# Patient Record
Sex: Male | Born: 1955
Health system: Southern US, Community
[De-identification: ages and names within clinical notes are randomized; demographics above are authoritative.]

## PROBLEM LIST (undated history)

## (undated) DIAGNOSIS — G8929 Other chronic pain: Secondary | ICD-10-CM

## (undated) DIAGNOSIS — I219 Acute myocardial infarction, unspecified: Secondary | ICD-10-CM

## (undated) DIAGNOSIS — T7840XA Allergy, unspecified, initial encounter: Secondary | ICD-10-CM

## (undated) DIAGNOSIS — N200 Calculus of kidney: Secondary | ICD-10-CM

## (undated) DIAGNOSIS — K219 Gastro-esophageal reflux disease without esophagitis: Secondary | ICD-10-CM

## (undated) DIAGNOSIS — I10 Essential (primary) hypertension: Secondary | ICD-10-CM

## (undated) DIAGNOSIS — F419 Anxiety disorder, unspecified: Secondary | ICD-10-CM

## (undated) DIAGNOSIS — I2510 Atherosclerotic heart disease of native coronary artery without angina pectoris: Secondary | ICD-10-CM

## (undated) DIAGNOSIS — F32A Depression, unspecified: Secondary | ICD-10-CM

## (undated) DIAGNOSIS — E119 Type 2 diabetes mellitus without complications: Secondary | ICD-10-CM

## (undated) DIAGNOSIS — B019 Varicella without complication: Secondary | ICD-10-CM

## (undated) DIAGNOSIS — K635 Polyp of colon: Secondary | ICD-10-CM

## (undated) DIAGNOSIS — F329 Major depressive disorder, single episode, unspecified: Secondary | ICD-10-CM

## (undated) DIAGNOSIS — E785 Hyperlipidemia, unspecified: Secondary | ICD-10-CM

## (undated) DIAGNOSIS — M1990 Unspecified osteoarthritis, unspecified site: Secondary | ICD-10-CM

## (undated) HISTORY — DX: Hyperlipidemia, unspecified: E78.5

## (undated) HISTORY — DX: Anxiety disorder, unspecified: F41.9

## (undated) HISTORY — PX: COLONOSCOPY: SHX174

## (undated) HISTORY — DX: Allergy, unspecified, initial encounter: T78.40XA

## (undated) HISTORY — DX: Essential (primary) hypertension: I10

## (undated) HISTORY — DX: Atherosclerotic heart disease of native coronary artery without angina pectoris: I25.10

## (undated) HISTORY — DX: Calculus of kidney: N20.0

## (undated) HISTORY — DX: Major depressive disorder, single episode, unspecified: F32.9

## (undated) HISTORY — DX: Varicella without complication: B01.9

## (undated) HISTORY — DX: Polyp of colon: K63.5

## (undated) HISTORY — DX: Acute myocardial infarction, unspecified: I21.9

## (undated) HISTORY — PX: CRYO INTERCOSTAL NERVE BLOCK: SHX6522

## (undated) HISTORY — DX: Unspecified osteoarthritis, unspecified site: M19.90

## (undated) HISTORY — DX: Other chronic pain: G89.29

## (undated) HISTORY — DX: Type 2 diabetes mellitus without complications: E11.9

## (undated) HISTORY — PX: OTHER SURGICAL HISTORY: SHX169

## (undated) HISTORY — DX: Depression, unspecified: F32.A

## (undated) HISTORY — PX: POLYPECTOMY: SHX149

## (undated) HISTORY — DX: Gastro-esophageal reflux disease without esophagitis: K21.9

---
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
## 2012-08-15 DIAGNOSIS — I2510 Atherosclerotic heart disease of native coronary artery without angina pectoris: Secondary | ICD-10-CM

## 2012-08-15 HISTORY — DX: Atherosclerotic heart disease of native coronary artery without angina pectoris: I25.10

## 2012-12-29 ENCOUNTER — Encounter: Payer: Self-pay | Admitting: Family Medicine

## 2012-12-29 ENCOUNTER — Encounter (INDEPENDENT_AMBULATORY_CARE_PROVIDER_SITE_OTHER): Payer: Self-pay

## 2012-12-29 ENCOUNTER — Ambulatory Visit (INDEPENDENT_AMBULATORY_CARE_PROVIDER_SITE_OTHER): Payer: BC Managed Care – PPO | Admitting: Family Medicine

## 2012-12-29 VITALS — BP 136/78 | HR 69 | Temp 98.1°F | Ht 74.0 in | Wt 215.0 lb

## 2012-12-29 DIAGNOSIS — Z23 Encounter for immunization: Secondary | ICD-10-CM

## 2012-12-29 DIAGNOSIS — I2510 Atherosclerotic heart disease of native coronary artery without angina pectoris: Secondary | ICD-10-CM

## 2012-12-29 DIAGNOSIS — E785 Hyperlipidemia, unspecified: Secondary | ICD-10-CM

## 2012-12-29 DIAGNOSIS — M47817 Spondylosis without myelopathy or radiculopathy, lumbosacral region: Secondary | ICD-10-CM

## 2012-12-29 DIAGNOSIS — K219 Gastro-esophageal reflux disease without esophagitis: Secondary | ICD-10-CM

## 2012-12-29 DIAGNOSIS — I10 Essential (primary) hypertension: Secondary | ICD-10-CM

## 2012-12-29 DIAGNOSIS — F329 Major depressive disorder, single episode, unspecified: Secondary | ICD-10-CM | POA: Insufficient documentation

## 2012-12-29 DIAGNOSIS — R7303 Prediabetes: Secondary | ICD-10-CM

## 2012-12-29 DIAGNOSIS — G250 Essential tremor: Secondary | ICD-10-CM

## 2012-12-29 DIAGNOSIS — F3289 Other specified depressive episodes: Secondary | ICD-10-CM

## 2012-12-29 DIAGNOSIS — R7309 Other abnormal glucose: Secondary | ICD-10-CM

## 2012-12-29 DIAGNOSIS — N200 Calculus of kidney: Secondary | ICD-10-CM

## 2012-12-29 DIAGNOSIS — Z860100 Personal history of colon polyps, unspecified: Secondary | ICD-10-CM

## 2012-12-29 DIAGNOSIS — F32A Depression, unspecified: Secondary | ICD-10-CM

## 2012-12-29 DIAGNOSIS — M47816 Spondylosis without myelopathy or radiculopathy, lumbar region: Secondary | ICD-10-CM | POA: Insufficient documentation

## 2012-12-29 DIAGNOSIS — N400 Enlarged prostate without lower urinary tract symptoms: Secondary | ICD-10-CM

## 2012-12-29 NOTE — Progress Notes (Signed)
Subjective:    Patient ID: Peter Snyder, male    DOB: 08/10/1955, 57 y.o.   MRN: [id number]  HPI  Here to establish care. He has a past history of multiple medical problems including history of CAD, GERD, hyperlipidemia, hypertension, depression, kidney stones, benign colon polyps, and prediabetes.  Patient had cardiac catheterization back in June of this year which revealed ejection fraction 35-40% with inferior wall akinesis with 0% left main, 30% proximal left circumflex, 100% tiny obtuse marginal 1 with collateral flow, 50% LAD, 50% diagonal 1, 100% RCA with collaterals. Echocardiogram revealed ejection fraction 55%. He has pending appointment with local cardiologist. No recent chest pain. No consistent exercise.  He's made substantial lifestyle changes over the past several months. He states he had A1c of 7.5% several months ago and prior to leaving [place] to move here 6.4%.  He's lost about 20 pounds.  No recent chest pains. No syncope. No dyspnea. Medications are reviewed and are listed elsewhere  Patient is married. He is retired from [employer]. Tetanus 2006. Colonoscopy last year. Flu vaccine already given.  Past Medical History  Diagnosis Date  . Arthritis   . Chicken pox   . Depression   . GERD (gastroesophageal reflux disease)   . Heart disease   . Hypertension   . Hyperlipidemia   . Kidney stone   . Colon polyps    Past Surgical History  Procedure Laterality Date  . Tonsillectomy  1963    reports that he has never smoked. He does not have any smokeless tobacco history on file. He reports that he does not drink alcohol or use illicit drugs. family history includes Arthritis in his father and paternal grandmother; Heart disease in his maternal grandmother; Hyperlipidemia in his maternal grandmother. Allergies  Allergen Reactions  . Sulfa Antibiotics       Review of Systems  Constitutional: Negative for fever, appetite change and fatigue.  HENT:  Negative for trouble swallowing.   Eyes: Negative for visual disturbance.  Respiratory: Negative for cough, chest tightness and shortness of breath.   Cardiovascular: Negative for chest pain, palpitations and leg swelling.  Gastrointestinal: Negative for nausea, vomiting, abdominal pain, diarrhea and constipation.  Endocrine: Negative for polydipsia and polyuria.  Genitourinary: Negative for dysuria.  Musculoskeletal: Positive for arthralgias.  Skin: Negative for rash.  Neurological: Negative for dizziness, syncope, weakness, light-headedness and headaches.  Hematological: Negative for adenopathy. Does not bruise/bleed easily.  Psychiatric/Behavioral: Negative for dysphoric mood.       Objective:   Physical Exam  Constitutional: He is oriented to person, place, and time. He appears well-developed and well-nourished.  HENT:  Right Ear: External ear normal.  Left Ear: External ear normal.  Mouth/Throat: Oropharynx is clear and moist.  Neck: Neck supple. No thyromegaly present.  Cardiovascular: Normal rate and regular rhythm.  Exam reveals no gallop.   Pulmonary/Chest: Effort normal and breath sounds normal. No respiratory distress. He has no wheezes. He has no rales.  Abdominal: Soft. He exhibits no mass. There is no tenderness. There is no rebound and no guarding.  Musculoskeletal: He exhibits no edema.  Neurological: He is alert and oriented to person, place, and time.  Skin: No rash noted.  Psychiatric: He has a normal mood and affect. His behavior is normal.          Assessment & Plan:  #1 history of CAD. Diffuse coronary disease as above. Treated medically. Pending followup with cardiologist. Discussed aggressive secondary prevention.  Will plan on labs in  3 months, including lipids and A1C #2 history of GERD well controlled with Nexium #3 history of depression treated with Lexapro and stable #4 history of chronic degenerative arthritis cervical and lumbar spine. Followed  by a local orthopedist #5 history of kidney stones #6 history of benign colon polyps #7 hypertension currently adequately controlled #8 history of dyslipidemia with reported lipids in September. Records pending #9history of BPH -stable on Flomax #10 seasonal allergies

## 2013-01-17 ENCOUNTER — Encounter: Payer: Self-pay | Admitting: Cardiology

## 2013-01-18 ENCOUNTER — Telehealth: Payer: Self-pay | Admitting: Cardiology

## 2013-01-18 ENCOUNTER — Encounter: Payer: Self-pay | Admitting: Cardiology

## 2013-01-18 ENCOUNTER — Ambulatory Visit (INDEPENDENT_AMBULATORY_CARE_PROVIDER_SITE_OTHER): Payer: BC Managed Care – PPO | Admitting: Cardiology

## 2013-01-18 VITALS — BP 110/59 | HR 63 | Ht 74.0 in | Wt 212.0 lb

## 2013-01-18 DIAGNOSIS — I2510 Atherosclerotic heart disease of native coronary artery without angina pectoris: Secondary | ICD-10-CM

## 2013-01-18 DIAGNOSIS — I10 Essential (primary) hypertension: Secondary | ICD-10-CM

## 2013-01-18 DIAGNOSIS — I2589 Other forms of chronic ischemic heart disease: Secondary | ICD-10-CM

## 2013-01-18 DIAGNOSIS — I255 Ischemic cardiomyopathy: Secondary | ICD-10-CM | POA: Insufficient documentation

## 2013-01-18 DIAGNOSIS — E785 Hyperlipidemia, unspecified: Secondary | ICD-10-CM

## 2013-01-18 NOTE — Assessment & Plan Note (Signed)
Continue present blood pressure medications. 

## 2013-01-18 NOTE — Assessment & Plan Note (Signed)
Continue ACE inhibitor and beta blocker. Plan repeat echocardiogram to reassess LV function when he returns in 6 months.

## 2013-01-18 NOTE — Assessment & Plan Note (Signed)
Continue statin. 

## 2013-01-18 NOTE — Telephone Encounter (Signed)
Left message for pt to change to 2 tabs at bedtime and one tab in the morning

## 2013-01-18 NOTE — Progress Notes (Signed)
HPI: 57 year old male for evaluation of coronary artery disease. Previous cardiac care in [place]. Echocardiogram in June 2014 showed an ejection fraction of 55%. Nuclear study in June of 2014 showed an ejection fraction of 43%, inferior and anterior defect and left ventricular dilatation with exercise. Cardiac catheterization performed in June of 2014 showed an ejection fraction of 35-40%. The inferior wall is akinetic. The left main was normal. There was a 30% proximal circumflex, occluded small first obtuse marginal with collaterals, a 50% ramus intermedius, 50% LAD, 50% first diagonal and an occluded right coronary artery with collaterals. Patient recently moved here and presents to establish. Patient denies dyspnea, chest pain, palpitations or syncope.  Current Outpatient Prescriptions  Medication Sig Dispense Refill  . ALPRAZolam (XANAX) 0.25 MG tablet Take 0.25 mg by mouth at bedtime as needed for sleep.      Marland Kitchen aspirin 81 MG tablet Take 81 mg by mouth daily.      Marland Kitchen atorvastatin (LIPITOR) 80 MG tablet Take 80 mg by mouth daily.      . benzonatate (TESSALON) 100 MG capsule       . carvedilol (COREG) 12.5 MG tablet Take 2 tablets in the morning and 1 tablet at night      . cefadroxil (DURICEF) 500 MG capsule       . celecoxib (CELEBREX) 200 MG capsule Take 200 mg by mouth 2 (two) times daily.      . cholecalciferol (VITAMIN D) 1000 UNITS tablet Take 1,000 Units by mouth daily.      . clidinium-chlordiazePOXIDE (LIBRAX) 5-2.5 MG per capsule       . clonazePAM (KLONOPIN) 0.5 MG tablet Once daily as needed for sleep      . Dermatological Products, Misc. (TETRIX) KIT       . escitalopram (LEXAPRO) 10 MG tablet Take 10 mg by mouth daily.      Marland Kitchen esomeprazole (NEXIUM) 20 MG capsule Take 20 mg by mouth daily before breakfast.      . fish oil-omega-3 fatty acids 1000 MG capsule Take 2 g by mouth daily.      . fluticasone (FLONASE) 50 MCG/ACT nasal spray Place 2 sprays into the nose daily.        Marland Kitchen gabapentin (NEURONTIN) 600 MG tablet Take 600 mg by mouth 2 (two) times daily.      Marland Kitchen lisinopril (PRINIVIL,ZESTRIL) 5 MG tablet Take 5 mg by mouth daily.      Marland Kitchen LOCOID LIPOCREAM 0.1 % CREA       . LOTEMAX 0.5 % GEL       . nortriptyline (PAMELOR) 10 MG capsule Take 10 mg by mouth at bedtime.      Marland Kitchen PROTOPIC 0.1 % ointment       . RESTASIS 0.05 % ophthalmic emulsion       . Tamsulosin HCl (FLOMAX PO) Take by mouth.      . VOLTAREN 1 % GEL       . zolpidem (AMBIEN) 10 MG tablet Take 10 mg by mouth at bedtime as needed for sleep.       No current facility-administered medications for this visit.    Allergies  Allergen Reactions  . Sulfa Antibiotics     Past Medical History  Diagnosis Date  . Arthritis   . Chicken pox   . Depression   . GERD (gastroesophageal reflux disease)   . CAD (coronary artery disease) 6/14    Cardiac catheterization 6/27/ 2014 ejection fraction 35-40%, 30% proximal  left circumflex, 100% tiny obtuse marginal 1 with collaterals, 50% LAD, 50% D1, 100% RCA with collaterals  . Hypertension   . Hyperlipidemia   . Kidney stone   . Colon polyps   . Diabetes mellitus     Diet controlled    Past Surgical History  Procedure Laterality Date  . Tonsillectomy  1963    History   Social History  . Marital Status: Married    Spouse Name: N/A    Number of Children: 3  . Years of Education: N/A   Occupational History  .      Retired   Social History Main Topics  . Smoking status: Never Smoker   . Smokeless tobacco: Not on file  . Alcohol Use: No  . Drug Use: No  . Sexual Activity: Not on file   Other Topics Concern  . Not on file   Social History Narrative  . No narrative on file    Family History  Problem Relation Age of Onset  . Arthritis Father   . Hyperlipidemia Maternal Grandmother   . Heart disease Maternal Grandmother   . Arthritis Paternal Grandmother     ROS: no fevers or chills, productive cough, hemoptysis, dysphasia,  odynophagia, melena, hematochezia, dysuria, hematuria, rash, seizure activity, orthopnea, PND, pedal edema, claudication. Remaining systems are negative.  Physical Exam:   Blood pressure 110/59, pulse 63, height 6\' 2"  (1.88 m), weight 212 lb (96.163 kg).  General:  Well developed/well nourished in NAD Skin warm/dry Patient not depressed No peripheral clubbing Back-normal HEENT-normal/normal eyelids Neck supple/normal carotid upstroke bilaterally; no bruits; no JVD; no thyromegaly chest - CTA/ normal expansion CV - RRR/normal S1 and S2; no murmurs, rubs or gallops;  PMI nondisplaced Abdomen -NT/ND, no HSM, no mass, + bowel sounds, no bruit 2+ femoral pulses, no bruits Ext-no edema, chords, 2+ DP Neuro-grossly nonfocal  ECG Sinus rhythm, inferior MI, LVH, IRBBB, PVC

## 2013-01-18 NOTE — Telephone Encounter (Signed)
New message  Patient has questions regarding koreg, He takes 2 in the morning and 1 @ bedtime.. This makes him sleepy during the day. Please advise.

## 2013-01-18 NOTE — Assessment & Plan Note (Signed)
Continue aspirin and statin. 

## 2013-01-18 NOTE — Patient Instructions (Signed)
Your physician wants you to follow-up in: 6 MONTHS WITH DR CRENSHAW You will receive a reminder letter in the mail two months in advance. If you don't receive a letter, please call our office to schedule the follow-up appointment.  

## 2013-01-26 ENCOUNTER — Other Ambulatory Visit: Payer: Self-pay | Admitting: Family Medicine

## 2013-01-27 ENCOUNTER — Ambulatory Visit (INDEPENDENT_AMBULATORY_CARE_PROVIDER_SITE_OTHER): Payer: BC Managed Care – PPO | Admitting: Family Medicine

## 2013-01-27 ENCOUNTER — Encounter: Payer: Self-pay | Admitting: Family Medicine

## 2013-01-27 VITALS — BP 118/78 | HR 106 | Temp 97.8°F | Wt 213.0 lb

## 2013-01-27 DIAGNOSIS — J019 Acute sinusitis, unspecified: Secondary | ICD-10-CM

## 2013-01-27 MED ORDER — BENZONATATE 200 MG PO CAPS: 200.0000 mg | ORAL_CAPSULE | Freq: Three times a day (TID) | ORAL | Status: DC | PRN

## 2013-01-27 MED ORDER — AMOXICILLIN-POT CLAVULANATE 875-125 MG PO TABS: 1.0000 | ORAL_TABLET | Freq: Two times a day (BID) | ORAL | Status: DC

## 2013-01-27 NOTE — Patient Instructions (Signed)

## 2013-01-27 NOTE — Progress Notes (Signed)
Pre visit review using our clinic review tool, if applicable. No additional management support is needed unless otherwise documented below in the visit note. 

## 2013-01-27 NOTE — Progress Notes (Signed)
  Subjective:    Patient ID: Peter Snyder, male    DOB: 1955/05/19, 57 y.o.   MRN: [id number]  HPI Acute visit for nasal congestion sinus congestion and cough. Onset a few days ago. Left maxillary facial pain. Intermittent bleeding. Greenish nasal discharge. No body aches. No fevers. Took Tessalon Perles for cough which helped. Requesting refill. Patient nonsmoker. Similar symptoms of acute sinusitis in the past.  Past Medical History  Diagnosis Date  . Arthritis   . Chicken pox   . Depression   . GERD (gastroesophageal reflux disease)   . CAD (coronary artery disease) 6/14    Cardiac catheterization 6/27/ 2014 ejection fraction 35-40%, 30% proximal left circumflex, 100% tiny obtuse marginal 1 with collaterals, 50% LAD, 50% D1, 100% RCA with collaterals  . Hypertension   . Hyperlipidemia   . Kidney stone   . Colon polyps   . Diabetes mellitus     Diet controlled   Past Surgical History  Procedure Laterality Date  . Tonsillectomy  1963    reports that he has never smoked. He does not have any smokeless tobacco history on file. He reports that he does not drink alcohol or use illicit drugs. family history includes Arthritis in his father and paternal grandmother; Heart disease in his maternal grandmother; Hyperlipidemia in his maternal grandmother. Allergies  Allergen Reactions  . Sulfa Antibiotics       Review of Systems  Constitutional: Positive for fatigue. Negative for fever, activity change and appetite change.  HENT: Positive for postnasal drip and sinus pressure. Negative for congestion, ear pain, sore throat and trouble swallowing.   Eyes: Negative for visual disturbance.  Respiratory: Positive for cough. Negative for shortness of breath and wheezing.   Neurological: Negative for dizziness and headaches.  Hematological: Negative for adenopathy.  Psychiatric/Behavioral: Negative for confusion and dysphoric mood.       Objective:   Physical Exam  Constitutional: He  appears well-developed and well-nourished.  HENT:  Right Ear: External ear normal.  Left Ear: External ear normal.  Mouth/Throat: Oropharynx is clear and moist.  Erythematous nasal mucosa with yellowish mucus left naris  Neck: Neck supple. No thyromegaly present.  Cardiovascular: Normal rate and regular rhythm.   Pulmonary/Chest: Effort normal and breath sounds normal. No respiratory distress. He has no wheezes. He has no rales.          Assessment & Plan:  Acute sinusitis. Augmentin 875 mg twice a day for 10 days. Continue Mucinex. Refill Tessalon for as needed use

## 2013-01-28 ENCOUNTER — Ambulatory Visit (INDEPENDENT_AMBULATORY_CARE_PROVIDER_SITE_OTHER): Payer: BC Managed Care – PPO | Admitting: Family Medicine

## 2013-01-28 ENCOUNTER — Encounter: Payer: Self-pay | Admitting: Family Medicine

## 2013-01-28 VITALS — BP 132/84 | HR 74 | Temp 97.7°F | Wt 213.0 lb

## 2013-01-28 DIAGNOSIS — R300 Dysuria: Secondary | ICD-10-CM

## 2013-01-28 LAB — POCT URINALYSIS DIPSTICK
Bilirubin, UA: NEGATIVE
Blood, UA: NEGATIVE
Glucose, UA: NEGATIVE
Leukocytes, UA: NEGATIVE
Spec Grav, UA: 1.02
pH, UA: 6.5

## 2013-01-28 NOTE — Progress Notes (Signed)
Pre visit review using our clinic review tool, if applicable. No additional management support is needed unless otherwise documented below in the visit note. 

## 2013-01-28 NOTE — Progress Notes (Signed)
  Subjective:    Patient ID: Peter Snyder, male    DOB: 31-Aug-1955, 57 y.o.   MRN: [id number]  HPI Patient was just seen yesterday for acute sinusitis symptoms Presents today with some burning with urination only once early this morning. This was at the end of urination. No fevers or chills. No abdominal pain. No flank pain. Does have history of previous kidney stones. No similar pain. No gross hematuria. Urine has been clear. He is taking Augmentin which was started yesterday for his sinus symptoms. No penile discharge  Past Medical History  Diagnosis Date  . Arthritis   . Chicken pox   . Depression   . GERD (gastroesophageal reflux disease)   . CAD (coronary artery disease) 6/14    Cardiac catheterization 6/27/ 2014 ejection fraction 35-40%, 30% proximal left circumflex, 100% tiny obtuse marginal 1 with collaterals, 50% LAD, 50% D1, 100% RCA with collaterals  . Hypertension   . Hyperlipidemia   . Kidney stone   . Colon polyps   . Diabetes mellitus     Diet controlled   Past Surgical History  Procedure Laterality Date  . Tonsillectomy  1963    reports that he has never smoked. He does not have any smokeless tobacco history on file. He reports that he does not drink alcohol or use illicit drugs. family history includes Arthritis in his father and paternal grandmother; Heart disease in his maternal grandmother; Hyperlipidemia in his maternal grandmother. Allergies  Allergen Reactions  . Sulfa Antibiotics       Review of Systems  Constitutional: Negative for fever and chills.  Gastrointestinal: Negative for abdominal pain.  Genitourinary: Positive for dysuria. Negative for hematuria and flank pain.       Objective:   Physical Exam  Constitutional: He appears well-developed and well-nourished.  Cardiovascular: Normal rate and regular rhythm.   Pulmonary/Chest: Effort normal and breath sounds normal. No respiratory distress. He has no wheezes. He has no rales.   Genitourinary: Rectum normal and prostate normal.          Assessment & Plan:  Dysuria. Urine reveals only trace ketones. No nitrites, leukocytes, or blood. Exam does not show any evidence for acute prostatitis. Continue Augmentin for sinus symptoms. Stay well hydrated. Followup if symptoms persist

## 2013-01-28 NOTE — Patient Instructions (Signed)
Your urine looks clear-no blood and no evidence for infection. Continue with Augmentin.  Follow up for any fever or continued urinary symptoms.

## 2013-02-08 ENCOUNTER — Other Ambulatory Visit: Payer: Self-pay | Admitting: Family Medicine

## 2013-02-14 ENCOUNTER — Telehealth: Payer: Self-pay | Admitting: Family Medicine

## 2013-02-14 NOTE — Telephone Encounter (Signed)
Pt has finished all meds and still has the sinus infection. Stopped up nose, face hurts, cough. Would like to know id he could call something else in. Pt has been in  for this issue  Two weeks [facility]

## 2013-02-14 NOTE — Telephone Encounter (Signed)
Last seen 01/27/13

## 2013-02-14 NOTE — Telephone Encounter (Signed)
Send in rx for Levaquin 500 mg once daily for 7 days.  If no better after that, he will need x-ray of sinuses.

## 2013-02-15 MED ORDER — LEVOFLOXACIN 500 MG PO TABS: 500.0000 mg | ORAL_TABLET | Freq: Every day | ORAL | Status: DC

## 2013-02-15 NOTE — Telephone Encounter (Signed)
Pt aware that RX is sent to pharmacy  

## 2013-02-24 ENCOUNTER — Encounter: Payer: Self-pay | Admitting: Family Medicine

## 2013-02-24 ENCOUNTER — Telehealth: Payer: Self-pay | Admitting: Family Medicine

## 2013-02-24 ENCOUNTER — Ambulatory Visit: Payer: BC Managed Care – PPO | Admitting: Family Medicine

## 2013-02-24 ENCOUNTER — Ambulatory Visit (INDEPENDENT_AMBULATORY_CARE_PROVIDER_SITE_OTHER): Payer: BC Managed Care – PPO | Admitting: Family Medicine

## 2013-02-24 VITALS — BP 116/74 | HR 87 | Temp 97.7°F | Wt 216.0 lb

## 2013-02-24 DIAGNOSIS — J019 Acute sinusitis, unspecified: Secondary | ICD-10-CM

## 2013-02-24 MED ORDER — BENZONATATE 200 MG PO CAPS: 200.0000 mg | ORAL_CAPSULE | Freq: Three times a day (TID) | ORAL | Status: DC | PRN

## 2013-02-24 MED ORDER — LEVOFLOXACIN 500 MG PO TABS: 500.0000 mg | ORAL_TABLET | Freq: Every day | ORAL | Status: DC

## 2013-02-24 NOTE — Progress Notes (Signed)
   Subjective:    Patient ID: Peter Snyder, male    DOB: 05/01/1955, 57 y.o.   MRN: [id number]  HPI Long history of recurrent sinusitis. Patient was recently treated with Augmentin but felt he had lingering symptoms. Some intermittent headaches, colored nasal discharge, and facial pain. We called in Levaquin 500 mg once daily for 7 days. He is improved but still some lingering sinus issues. He is requesting another 7 days of Levaquin. He states that prior to moving here he had CT of sinuses when he lived in [place] which did show some pansinusitis. Subsequent films apparently showed clearing. He does not smoke. No recent chest pains. No fever.  Past Medical History  Diagnosis Date  . Arthritis   . Chicken pox   . Depression   . GERD (gastroesophageal reflux disease)   . CAD (coronary artery disease) 6/14    Cardiac catheterization 6/27/ 2014 ejection fraction 35-40%, 30% proximal left circumflex, 100% tiny obtuse marginal 1 with collaterals, 50% LAD, 50% D1, 100% RCA with collaterals  . Hypertension   . Hyperlipidemia   . Kidney stone   . Colon polyps   . Diabetes mellitus     Diet controlled   Past Surgical History  Procedure Laterality Date  . Tonsillectomy  1963    reports that he has never smoked. He does not have any smokeless tobacco history on file. He reports that he does not drink alcohol or use illicit drugs. family history includes Arthritis in his father and paternal grandmother; Heart disease in his maternal grandmother; Hyperlipidemia in his maternal grandmother. Allergies  Allergen Reactions  . Sulfa Antibiotics       Review of Systems  Constitutional: Negative for fever and chills.  HENT: Positive for congestion.   Neurological: Positive for headaches.       Objective:   Physical Exam  Constitutional: He appears well-developed and well-nourished.  HENT:  Right Ear: External ear normal.  Left Ear: External ear normal.  Nose: Nose normal.  Mouth/Throat:  Oropharynx is clear and moist.  Cardiovascular: Normal rate.   Pulmonary/Chest: Effort normal and breath sounds normal. No respiratory distress. He has no wheezes. He has no rales.          Assessment & Plan:  Persistent sinusitis symptoms. Improved but not resolved. Refill Levaquin 500 milligrams once daily for 7 more days. Refill benzonatate 200 mg every 8 hours as negative for cough. Will send for old records from his previous physician in [place]

## 2013-02-24 NOTE — Telephone Encounter (Signed)
Pt instructed to fu in Feb. Pt states he thinks its a full panel of labs. pls advise.

## 2013-03-02 NOTE — Telephone Encounter (Signed)
Patient is coming in for appt in February. He stated that he is suppose to get a full panel of labs.

## 2013-03-02 NOTE — Telephone Encounter (Signed)
Patient Information:  Caller Name: Attilio  Phone: [phone]  Patient: Peter Snyder, Peter Snyder  Gender: Male  DOB: [birth year]  Age: 57 Years  PCP: Evelena Peat [facility])  Office Follow Up:  Does the office need to follow up with this patient?: No  Instructions For The Office: N/A  RN Note:  Pt still has some sinus congestion after a course of Levaquin. His sx are lingering but have improved. The sx are worse when he wakes but improve as the day goes on. He will try some homecare measures for his long standing sinus issues and call back for a FU.  Symptoms  Reason For Call & Symptoms: Cough, sinus congestion  Reviewed Health History In EMR: Yes  Reviewed Medications In EMR: Yes  Reviewed Allergies In EMR: Yes  Reviewed Surgeries / Procedures: No  Date of Onset of Symptoms: 02/24/2013  Guideline(s) Used:  Sinus Pain and Congestion  Disposition Per Guideline:   Home Care  Reason For Disposition Reached:   Sinus congestion as part of a cold, present < 10 days  Advice Given:  For a Stuffy Nose - Use Nasal Washes:  Introduction: Saline (salt water) nasal irrigation (nasal wash) is an effective and simple home remedy for treating stuffy nose and sinus congestion. The nose can be irrigated by pouring, spraying, or squirting salt water into the nose and then letting it run back out.  How it Helps: The salt water rinses out excess mucus, washes out any irritants (dust, allergens) that might be present, and moistens the nasal cavity.  Patient Will Follow Care Advice:  YES

## 2013-03-03 ENCOUNTER — Other Ambulatory Visit: Payer: Self-pay | Admitting: Family Medicine

## 2013-03-03 DIAGNOSIS — I10 Essential (primary) hypertension: Secondary | ICD-10-CM

## 2013-03-03 DIAGNOSIS — N400 Enlarged prostate without lower urinary tract symptoms: Secondary | ICD-10-CM

## 2013-03-03 DIAGNOSIS — E785 Hyperlipidemia, unspecified: Secondary | ICD-10-CM

## 2013-03-03 NOTE — Telephone Encounter (Signed)
Orders are placed 

## 2013-03-03 NOTE — Telephone Encounter (Signed)
Get lipid, hepatic, BMP, TSH, A1C, CBC, PSA.

## 2013-03-21 ENCOUNTER — Ambulatory Visit (INDEPENDENT_AMBULATORY_CARE_PROVIDER_SITE_OTHER): Payer: BC Managed Care – PPO | Admitting: Family Medicine

## 2013-03-21 ENCOUNTER — Encounter: Payer: Self-pay | Admitting: Family Medicine

## 2013-03-21 VITALS — BP 118/82 | Temp 98.5°F | Wt 211.0 lb

## 2013-03-21 DIAGNOSIS — J069 Acute upper respiratory infection, unspecified: Secondary | ICD-10-CM

## 2013-03-21 DIAGNOSIS — J329 Chronic sinusitis, unspecified: Secondary | ICD-10-CM

## 2013-03-21 MED ORDER — BENZONATATE 100 MG PO CAPS: 100.0000 mg | ORAL_CAPSULE | Freq: Two times a day (BID) | ORAL | Status: DC | PRN

## 2013-03-21 NOTE — Patient Instructions (Signed)
INSTRUCTIONS FOR UPPER RESPIRATORY INFECTION:  -plenty of rest and fluids  -nasal saline wash 2-3 times daily (use prepackaged nasal saline or bottled/distilled water if making your own)   -can use sinex or afrin nasal spray for drainage and nasal congestion - but do NOT use longer then 3-4 days  -can use tylenol or ibuprofen as directed for aches and sorethroat  -in the winter time, using a humidifier at night is helpful (please follow cleaning instructions)  -if you are taking a cough medication - use only as directed, may also try a teaspoon of honey to coat the throat and throat lozenges  -for sore throat, salt water gargles can help  -follow up if you have fevers, facial pain, tooth pain, difficulty breathing or are worsening or not getting better in 5-7 days  -Follow up with PCP for physical in 1 month and recheck genital exam

## 2013-03-21 NOTE — Progress Notes (Signed)
Pre visit review using our clinic review tool, if applicable. No additional management support is needed unless otherwise documented below in the visit note. 

## 2013-03-21 NOTE — Progress Notes (Signed)
Chief Complaint  Patient presents with  . Sinusitis    HPI:  URI -started: 2 days ago -symptoms:nasal congestion, drainage, cough -denies:fever, SOB, NVD, tooth pain, sinus pain -sick contacts/travel/risks: denies flu exposure or Ebola risks -Hx of: allergies and sinusitis treated in December and resolved, hx of sinusitis recurrent and has seen ent in the past -using nasal steroid and feels ike causes nose bleed  Bump in genital region: -hx of this in the past and evaluated by urologist and fine -not even sure if lump or normal anatomy - can not find it today -no pain -has physical with PCP in a few weeks  ROS: See pertinent positives and negatives per HPI.  Past Medical History  Diagnosis Date  . Arthritis   . Chicken pox   . Depression   . GERD (gastroesophageal reflux disease)   . CAD (coronary artery disease) 6/14    Cardiac catheterization 6/27/ 2014 ejection fraction 35-40%, 30% proximal left circumflex, 100% tiny obtuse marginal 1 with collaterals, 50% LAD, 50% D1, 100% RCA with collaterals  . Hypertension   . Hyperlipidemia   . Kidney stone   . Colon polyps   . Diabetes mellitus     Diet controlled    Past Surgical History  Procedure Laterality Date  . Tonsillectomy  1963    Family History  Problem Relation Age of Onset  . Arthritis Father   . Hyperlipidemia Maternal Grandmother   . Heart disease Maternal Grandmother   . Arthritis Paternal Grandmother     History   Social History  . Marital Status: Married    Spouse Name: N/A    Number of Children: 3  . Years of Education: N/A   Occupational History  .      Retired   Social History Main Topics  . Smoking status: Never Smoker   . Smokeless tobacco: None  . Alcohol Use: No  . Drug Use: No  . Sexual Activity: None   Other Topics Concern  . None   Social History Narrative  . None    Current outpatient prescriptions:ALPRAZolam (XANAX) 0.25 MG tablet, Take 0.25 mg by mouth at bedtime as  needed for sleep., Disp: , Rfl: ;  aspirin 81 MG tablet, Take 81 mg by mouth daily., Disp: , Rfl: ;  atorvastatin (LIPITOR) 80 MG tablet, Take 80 mg by mouth daily., Disp: , Rfl: ;  benzonatate (TESSALON) 200 MG capsule, Take 1 capsule (200 mg total) by mouth 3 (three) times daily as needed for cough., Disp: 30 capsule, Rfl: 1 carvedilol (COREG) 12.5 MG tablet, Take 2 tablets in the morning and 1 tablet at night, Disp: , Rfl: ;  celecoxib (CELEBREX) 200 MG capsule, Take 200 mg by mouth 2 (two) times daily., Disp: , Rfl: ;  cholecalciferol (VITAMIN D) 1000 UNITS tablet, Take 1,000 Units by mouth daily., Disp: , Rfl: ;  clidinium-chlordiazePOXIDE (LIBRAX) 5-2.5 MG per capsule, Take 1 capsule by mouth 2 (two) times daily. , Disp: , Rfl:  clonazePAM (KLONOPIN) 0.5 MG tablet, Once daily as needed for sleep, Disp: , Rfl: ;  escitalopram (LEXAPRO) 10 MG tablet, Take 10 mg by mouth daily., Disp: , Rfl: ;  esomeprazole (NEXIUM) 20 MG capsule, Take 20 mg by mouth daily before breakfast., Disp: , Rfl: ;  fish oil-omega-3 fatty acids 1000 MG capsule, Take 2 g by mouth daily., Disp: , Rfl: ;  fluticasone (FLONASE) 50 MCG/ACT nasal spray, Place 2 sprays into the nose daily., Disp: , Rfl:  gabapentin (NEURONTIN)  600 MG tablet, Take 600 mg by mouth 2 (two) times daily., Disp: , Rfl: ;  lisinopril (PRINIVIL,ZESTRIL) 5 MG tablet, Take 5 mg by mouth daily., Disp: , Rfl: ;  LOCOID LIPOCREAM 0.1 % CREA, Place on area prn, Disp: , Rfl: ;  LOTEMAX 0.5 % GEL, Use drops on left eye twice a day, Disp: , Rfl: ;  nortriptyline (PAMELOR) 10 MG capsule, Take 10 mg by mouth at bedtime., Disp: , Rfl:  PROTOPIC 0.1 % ointment, Place on arms twice a day, Disp: , Rfl: ;  RESTASIS 0.05 % ophthalmic emulsion, Place 1 drop into both eyes 2 (two) times daily. , Disp: , Rfl: ;  tamsulosin (FLOMAX) 0.4 MG CAPS capsule, TAKE 1 CAPSULE (0.4MG ) BY ORAL ROUTE EVERY DAY 1/2 HOUR FOLLOWING THE SAME MEAL EACH DAY, Disp: 30 capsule, Rfl: 11;  Tamsulosin HCl  (FLOMAX PO), Take by mouth., Disp: , Rfl:  VOLTAREN 1 % GEL, Apply 2 g topically 2 (two) times daily. , Disp: , Rfl: ;  zolpidem (AMBIEN) 10 MG tablet, Take 10 mg by mouth at bedtime as needed for sleep., Disp: , Rfl:   EXAM:  Filed Vitals:   03/21/13 1545  BP: 118/82  Temp: 98.5 F (36.9 C)    Body mass index is 27.08 kg/(m^2).  GENERAL: vitals reviewed and listed above, alert, oriented, appears well hydrated and in no acute distress  HEENT: atraumatic, conjunttiva clear, no obvious abnormalities on inspection of external nose and ears, normal appearance of ear canals and TMs, clear nasal congestion, mild post oropharyngeal erythema with PND, no tonsillar edema or exudate, no sinus TTP  NECK: no obvious masses on inspection  LUNGS: clear to auscultation bilaterally, no wheezes, rales or rhonchi, good air movement  CV: HRRR, no peripheral edema  GU: normal - no abnormal lesions palpated  MS: moves all extremities without noticeable abnormality  PSYCH: pleasant and cooperative, no obvious depression or anxiety  ASSESSMENT AND PLAN:  Discussed the following assessment and plan:  Upper respiratory infection -hx of Recurrent sinusitis -given HPI and exam findings today, a serious infection or illness is unlikely. We discussed potential etiologies, with VURI being most likely, and advised supportive care and monitoring. We discussed treatment side effects, likely course, antibiotic misuse, transmission, and signs of developing a serious illness. -of course, we advised to return or notify a doctor immediately if symptoms worsen or persist or new concerns arise. -stop steroid for a few weeks and then spray away from septum when resumes -recs below -if worsens or develops sinus pain again advised seeing ENT  For testicular concern - exam normal, advised re-check at physical in 1 month    Patient Instructions  INSTRUCTIONS FOR UPPER RESPIRATORY INFECTION:  -plenty of rest and  fluids  -nasal saline wash 2-3 times daily (use prepackaged nasal saline or bottled/distilled water if making your own)   -can use sinex or afrin nasal spray for drainage and nasal congestion - but do NOT use longer then 3-4 days  -can use tylenol or ibuprofen as directed for aches and sorethroat  -in the winter time, using a humidifier at night is helpful (please follow cleaning instructions)  -if you are taking a cough medication - use only as directed, may also try a teaspoon of honey to coat the throat and throat lozenges  -for sore throat, salt water gargles can help  -follow up if you have fevers, facial pain, tooth pain, difficulty breathing or are worsening or not getting better in 5-7  days  -Follow up with PCP for physical in 1 month and recheck genital exam      Deeana Atwater R.

## 2013-03-22 ENCOUNTER — Ambulatory Visit: Payer: BC Managed Care – PPO | Admitting: Family Medicine

## 2013-03-24 ENCOUNTER — Telehealth: Payer: Self-pay | Admitting: Cardiology

## 2013-03-24 NOTE — Telephone Encounter (Signed)
Spoke with pt, he is currently taking carvedilol 2 tabs in the am and 1 tab in the pm. He has developed dizziness in the morning when he gets up. His bp this am was 102/68. Pt will decrease the dosage to 1 tab twice daily. He will track his bp and he has a follow up appt with dr Stanford Breed 04-07-13. He will call if he cont to have a problem prior to appt.

## 2013-03-24 NOTE — Telephone Encounter (Signed)
New message    Pt thinks bp medications is making his bp too low.  He sometimes feels dizzy in the am.  Want to talk it over with Hilda Blades.

## 2013-03-29 ENCOUNTER — Telehealth: Payer: Self-pay | Admitting: Family Medicine

## 2013-03-29 NOTE — Telephone Encounter (Signed)
error 

## 2013-03-30 ENCOUNTER — Other Ambulatory Visit: Payer: Self-pay

## 2013-03-30 ENCOUNTER — Encounter: Payer: Self-pay | Admitting: Family Medicine

## 2013-03-30 ENCOUNTER — Ambulatory Visit (INDEPENDENT_AMBULATORY_CARE_PROVIDER_SITE_OTHER): Payer: BC Managed Care – PPO | Admitting: Family Medicine

## 2013-03-30 VITALS — BP 118/82 | HR 68 | Temp 97.8°F | Wt 216.0 lb

## 2013-03-30 DIAGNOSIS — J310 Chronic rhinitis: Secondary | ICD-10-CM

## 2013-03-30 DIAGNOSIS — I2510 Atherosclerotic heart disease of native coronary artery without angina pectoris: Secondary | ICD-10-CM

## 2013-03-30 DIAGNOSIS — L853 Xerosis cutis: Secondary | ICD-10-CM

## 2013-03-30 DIAGNOSIS — L738 Other specified follicular disorders: Secondary | ICD-10-CM

## 2013-03-30 MED ORDER — DESOXIMETASONE 0.05 % EX CREA: TOPICAL_CREAM | CUTANEOUS | Status: DC

## 2013-03-30 MED ORDER — BENZONATATE 200 MG PO CAPS: 200.0000 mg | ORAL_CAPSULE | Freq: Three times a day (TID) | ORAL | Status: DC | PRN

## 2013-03-30 NOTE — Progress Notes (Signed)
   Subjective:    Patient ID: Peter Snyder, male    DOB: 1956-02-20, 58 y.o.   MRN: [id number]  HPI Patient here to discuss the following issues  He has had persistent sinus congestive symptoms. Frequent sinus infections in the past and had previous sinus surgery. For several months now intermittent symptoms of congestion. Currently denies any facial pain, purulent secretions or headaches. His symptoms seem to be worse at night.    He has extremely dry skin most involving the feet. Tried moisturizing lotions without much improvement. His skin is starting to crack in heel especially right foot.  Patient's questions regarding his CAD history. He has history of occluded right coronary with collaterals and non critical obstruction involving other vessels. Takes aspirin, lisinopril, carvedilol, Lipitor. Walks 5 days a week for exercise. No recent chest pains. No dizziness. No syncope. No increase fatigue issues. He has followup with cardiologist soon.  Past Medical History  Diagnosis Date  . Arthritis   . Chicken pox   . Depression   . GERD (gastroesophageal reflux disease)   . CAD (coronary artery disease) 6/14    Cardiac catheterization 6/27/ 2014 ejection fraction 35-40%, 30% proximal left circumflex, 100% tiny obtuse marginal 1 with collaterals, 50% LAD, 50% D1, 100% RCA with collaterals  . Hypertension   . Hyperlipidemia   . Kidney stone   . Colon polyps   . Diabetes mellitus     Diet controlled   Past Surgical History  Procedure Laterality Date  . Tonsillectomy  1963    reports that he has never smoked. He does not have any smokeless tobacco history on file. He reports that he does not drink alcohol or use illicit drugs. family history includes Arthritis in his father and paternal grandmother; Heart disease in his maternal grandmother; Hyperlipidemia in his maternal grandmother. Allergies  Allergen Reactions  . Sulfa Antibiotics       Review of Systems  Constitutional:  Negative for appetite change, fatigue and unexpected weight change.  Eyes: Negative for visual disturbance.  Respiratory: Negative for cough, chest tightness and shortness of breath.   Cardiovascular: Negative for chest pain, palpitations and leg swelling.  Endocrine: Negative for polydipsia and polyuria.  Neurological: Negative for dizziness, syncope, weakness, light-headedness and headaches.       Objective:   Physical Exam  Constitutional: He appears well-developed and well-nourished.  Neck: Neck supple. No thyromegaly present.  Cardiovascular: Normal rate and regular rhythm.  Exam reveals no gallop.   No murmur heard. Pulmonary/Chest: Effort normal and breath sounds normal. No respiratory distress. He has no wheezes. He has no rales.  Musculoskeletal: He exhibits no edema.  Lymphadenopathy:    He has no cervical adenopathy.  Skin:  Patient is very dry skin on both feet with some mild hyperkeratosis and very superficial cracking of skin involving the heels of both feet          Assessment & Plan:  #1 chronic rhinitis. Already takes Flonase. He's had recurrent sinus infections. We discussed measures to reduce sinus drying and recurrent infection. Stay hydrated. Consider vaporizer or humidifier. Consider Mucinex. Remain on Flonase. Nasal saline at night. Reviewed indications for consideration of antibiotic but none indicated at this time #2 dry skin involving both feet. Add short-term use of Topicort 0.25% cream twice a day no more than 2 weeks continuously #3 history of CAD. Discussed secondary prevention. Currently asymptomatic with brisk walking. He has been compliant with medications

## 2013-03-30 NOTE — Progress Notes (Signed)
Pre visit review using our clinic review tool, if applicable. No additional management support is needed unless otherwise documented below in the visit note. 

## 2013-03-31 ENCOUNTER — Ambulatory Visit: Payer: BC Managed Care – PPO | Admitting: Family Medicine

## 2013-04-07 ENCOUNTER — Encounter: Payer: Self-pay | Admitting: Cardiology

## 2013-04-07 ENCOUNTER — Encounter: Payer: Self-pay | Admitting: *Deleted

## 2013-04-07 ENCOUNTER — Ambulatory Visit (INDEPENDENT_AMBULATORY_CARE_PROVIDER_SITE_OTHER): Payer: BC Managed Care – PPO | Admitting: Cardiology

## 2013-04-07 VITALS — BP 134/79 | HR 76 | Wt 211.0 lb

## 2013-04-07 DIAGNOSIS — I255 Ischemic cardiomyopathy: Secondary | ICD-10-CM

## 2013-04-07 DIAGNOSIS — I2589 Other forms of chronic ischemic heart disease: Secondary | ICD-10-CM

## 2013-04-07 DIAGNOSIS — I10 Essential (primary) hypertension: Secondary | ICD-10-CM

## 2013-04-07 DIAGNOSIS — I2510 Atherosclerotic heart disease of native coronary artery without angina pectoris: Secondary | ICD-10-CM

## 2013-04-07 DIAGNOSIS — E785 Hyperlipidemia, unspecified: Secondary | ICD-10-CM

## 2013-04-07 NOTE — Assessment & Plan Note (Signed)
Blood pressure controlled. Continue present medications. 

## 2013-04-07 NOTE — Assessment & Plan Note (Signed)
Continue ASA and statin. Continue risk factor modification.

## 2013-04-07 NOTE — Assessment & Plan Note (Signed)
Continue statin. 

## 2013-04-07 NOTE — Progress Notes (Signed)
HPI: FU coronary artery disease. Previous cardiac care in [place]. Echocardiogram in June 2014 showed an ejection fraction of 55%. Nuclear study in June of 2014 showed an ejection fraction of 43%, inferior and anterior defect and left ventricular dilatation with exercise. Cardiac catheterization performed in June of 2014 showed an ejection fraction of 35-40%. The inferior wall is akinetic. The left main was normal. There was a 30% proximal circumflex, occluded small first obtuse marginal with collaterals, a 50% ramus intermedius, 50% LAD, 50% first diagonal and an occluded right coronary artery with collaterals. Treated medically. Last seen 11/14. Since then, the patient denies any dyspnea on exertion, orthopnea, PND, pedal edema, palpitations, syncope or chest pain.    Current Outpatient Prescriptions  Medication Sig Dispense Refill  . ALPRAZolam (XANAX) 0.25 MG tablet Take 0.25 mg by mouth at bedtime as needed for sleep.      Marland Kitchen aspirin 81 MG tablet Take 81 mg by mouth daily.      Marland Kitchen atorvastatin (LIPITOR) 80 MG tablet Take 80 mg by mouth daily.      . benzonatate (TESSALON) 200 MG capsule Take 1 capsule (200 mg total) by mouth 3 (three) times daily as needed for cough.  30 capsule  0  . carvedilol (COREG) 12.5 MG tablet Take 2 tablets in the morning      . celecoxib (CELEBREX) 200 MG capsule Take 200 mg by mouth 2 (two) times daily.      . cholecalciferol (VITAMIN D) 1000 UNITS tablet Take 1,000 Units by mouth daily.      . clidinium-chlordiazePOXIDE (LIBRAX) 5-2.5 MG per capsule Take 1 capsule by mouth 2 (two) times daily.       . clonazePAM (KLONOPIN) 0.5 MG tablet Once daily as needed for sleep      . desoximetasone (TOPICORT) 0.05 % cream Use bid to affected areas prn but do not use more than 2 weeks continuously.  60 g  2  . escitalopram (LEXAPRO) 10 MG tablet Take 10 mg by mouth daily.      Marland Kitchen esomeprazole (NEXIUM) 20 MG capsule Take 20 mg by mouth daily before breakfast.      .  fish oil-omega-3 fatty acids 1000 MG capsule Take 2 g by mouth daily.      . fluticasone (FLONASE) 50 MCG/ACT nasal spray Place 2 sprays into the nose daily.      Marland Kitchen gabapentin (NEURONTIN) 600 MG tablet Take 600 mg by mouth 2 (two) times daily.      Marland Kitchen lisinopril (PRINIVIL,ZESTRIL) 5 MG tablet Take 5 mg by mouth daily.      Marland Kitchen LOCOID LIPOCREAM 0.1 % CREA Place on area prn      . LOTEMAX 0.5 % GEL Use drops on left eye twice a day      . nortriptyline (PAMELOR) 10 MG capsule Take 10 mg by mouth at bedtime.      Marland Kitchen PROTOPIC 0.1 % ointment Place on arms twice a day      . RESTASIS 0.05 % ophthalmic emulsion Place 1 drop into both eyes 2 (two) times daily.       . tamsulosin (FLOMAX) 0.4 MG CAPS capsule TAKE 1 CAPSULE (0.4MG ) BY ORAL ROUTE EVERY DAY 1/2 HOUR FOLLOWING THE SAME MEAL EACH DAY  30 capsule  11  . Tamsulosin HCl (FLOMAX PO) Take by mouth.      . VOLTAREN 1 % GEL Apply 2 g topically 2 (two) times daily.       Marland Kitchen  zolpidem (AMBIEN) 10 MG tablet Take 10 mg by mouth at bedtime as needed for sleep.       No current facility-administered medications for this visit.     Past Medical History  Diagnosis Date  . Arthritis   . Chicken pox   . Depression   . GERD (gastroesophageal reflux disease)   . CAD (coronary artery disease) 6/14    Cardiac catheterization 6/27/ 2014 ejection fraction 35-40%, 30% proximal left circumflex, 100% tiny obtuse marginal 1 with collaterals, 50% LAD, 50% D1, 100% RCA with collaterals  . Hypertension   . Hyperlipidemia   . Kidney stone   . Colon polyps   . Diabetes mellitus     Diet controlled    Past Surgical History  Procedure Laterality Date  . Tonsillectomy  1963    History   Social History  . Marital Status: Married    Spouse Name: N/A    Number of Children: 3  . Years of Education: N/A   Occupational History  .      Retired   Social History Main Topics  . Smoking status: Never Smoker   . Smokeless tobacco: Not on file  . Alcohol Use: No    . Drug Use: No  . Sexual Activity: Not on file   Other Topics Concern  . Not on file   Social History Narrative  . No narrative on file    ROS: no fevers or chills, productive cough, hemoptysis, dysphasia, odynophagia, melena, hematochezia, dysuria, hematuria, rash, seizure activity, orthopnea, PND, pedal edema, claudication. Remaining systems are negative.  Physical Exam: Well-developed well-nourished in no acute distress.  Skin is warm and dry.  HEENT is normal.  Neck is supple.  Chest is clear to auscultation with normal expansion.  Cardiovascular exam is regular rate and rhythm.  Abdominal exam nontender or distended. No masses palpated. Extremities show no edema. neuro grossly intact  ECG sinus rhythm at a rate of 76. Incomplete right bundle branch block. Prior inferior infarct.

## 2013-04-07 NOTE — Assessment & Plan Note (Signed)
Continue beta blocker and ACE inhibitor. He has some dizziness with standing suddenly and his systolic blood pressure runs approximately 115. I will not advance his medications. Repeat echocardiogram to assess LV function.

## 2013-04-07 NOTE — Patient Instructions (Signed)

## 2013-04-11 ENCOUNTER — Encounter: Payer: Self-pay | Admitting: Cardiology

## 2013-04-11 ENCOUNTER — Other Ambulatory Visit (HOSPITAL_COMMUNITY): Payer: BC Managed Care – PPO

## 2013-04-11 ENCOUNTER — Ambulatory Visit (HOSPITAL_COMMUNITY): Payer: BC Managed Care – PPO | Attending: Cardiology | Admitting: Cardiology

## 2013-04-11 DIAGNOSIS — E785 Hyperlipidemia, unspecified: Secondary | ICD-10-CM | POA: Insufficient documentation

## 2013-04-11 DIAGNOSIS — I2510 Atherosclerotic heart disease of native coronary artery without angina pectoris: Secondary | ICD-10-CM

## 2013-04-11 DIAGNOSIS — I080 Rheumatic disorders of both mitral and aortic valves: Secondary | ICD-10-CM | POA: Insufficient documentation

## 2013-04-11 DIAGNOSIS — I2589 Other forms of chronic ischemic heart disease: Secondary | ICD-10-CM

## 2013-04-11 DIAGNOSIS — I255 Ischemic cardiomyopathy: Secondary | ICD-10-CM

## 2013-04-11 DIAGNOSIS — I359 Nonrheumatic aortic valve disorder, unspecified: Secondary | ICD-10-CM

## 2013-04-11 DIAGNOSIS — I059 Rheumatic mitral valve disease, unspecified: Secondary | ICD-10-CM

## 2013-04-11 DIAGNOSIS — I77819 Aortic ectasia, unspecified site: Secondary | ICD-10-CM | POA: Insufficient documentation

## 2013-04-11 DIAGNOSIS — I10 Essential (primary) hypertension: Secondary | ICD-10-CM | POA: Insufficient documentation

## 2013-04-11 DIAGNOSIS — I4510 Unspecified right bundle-branch block: Secondary | ICD-10-CM | POA: Insufficient documentation

## 2013-04-11 NOTE — Progress Notes (Signed)
Echo performed. 

## 2013-04-12 ENCOUNTER — Ambulatory Visit (INDEPENDENT_AMBULATORY_CARE_PROVIDER_SITE_OTHER): Payer: BC Managed Care – PPO | Admitting: Family Medicine

## 2013-04-12 ENCOUNTER — Encounter: Payer: Self-pay | Admitting: Family Medicine

## 2013-04-12 ENCOUNTER — Telehealth: Payer: Self-pay | Admitting: Cardiology

## 2013-04-12 VITALS — BP 128/82 | HR 64 | Temp 97.9°F | Wt 212.0 lb

## 2013-04-12 DIAGNOSIS — N529 Male erectile dysfunction, unspecified: Secondary | ICD-10-CM

## 2013-04-12 DIAGNOSIS — R05 Cough: Secondary | ICD-10-CM

## 2013-04-12 DIAGNOSIS — R059 Cough, unspecified: Secondary | ICD-10-CM

## 2013-04-12 DIAGNOSIS — K120 Recurrent oral aphthae: Secondary | ICD-10-CM

## 2013-04-12 MED ORDER — BENZONATATE 200 MG PO CAPS: 200.0000 mg | ORAL_CAPSULE | Freq: Three times a day (TID) | ORAL | Status: DC | PRN

## 2013-04-12 MED ORDER — MAGIC MOUTHWASH: 10.0000 mL | Freq: Four times a day (QID) | ORAL | Status: DC | PRN

## 2013-04-12 MED ORDER — TADALAFIL 20 MG PO TABS: ORAL_TABLET | ORAL | Status: DC

## 2013-04-12 NOTE — Progress Notes (Signed)
   Subjective:    Patient ID: Peter Snyder, male    DOB: 1955-06-20, 58 y.o.   MRN: [id number]  HPI For several issues: Aphthous ulcers the past 2-3 days. He's had these in the past. Minimally painful. He is requesting medicated mouthwash. He has used in the past. No difficulty with swallowing.  He complains of erectile dysfunction. Has used Cialis the past. Does have history of CAD but does not take any nitroglycerin either regularly or when necessary.  Patient has occasional dry cough and has used Tessalon past and requesting refill. Has occasional postnasal drip symptoms. No recent chest pains. Nonsmoker.  Past Medical History  Diagnosis Date  . Arthritis   . Chicken pox   . Depression   . GERD (gastroesophageal reflux disease)   . CAD (coronary artery disease) 6/14    Cardiac catheterization 6/27/ 2014 ejection fraction 35-40%, 30% proximal left circumflex, 100% tiny obtuse marginal 1 with collaterals, 50% LAD, 50% D1, 100% RCA with collaterals  . Hypertension   . Hyperlipidemia   . Kidney stone   . Colon polyps   . Diabetes mellitus     Diet controlled   Past Surgical History  Procedure Laterality Date  . Tonsillectomy  1963    reports that he has never smoked. He does not have any smokeless tobacco history on file. He reports that he does not drink alcohol or use illicit drugs. family history includes Arthritis in his father and paternal grandmother; Heart disease in his maternal grandmother; Hyperlipidemia in his maternal grandmother. Allergies  Allergen Reactions  . Sulfa Antibiotics       Review of Systems  Constitutional: Negative for fever, chills and fatigue.  HENT: Negative for trouble swallowing.   Eyes: Negative for visual disturbance.  Respiratory: Negative for cough, chest tightness and shortness of breath.   Cardiovascular: Negative for chest pain, palpitations and leg swelling.  Neurological: Negative for dizziness, syncope, weakness, light-headedness  and headaches.       Objective:   Physical Exam  Constitutional: He appears well-developed and well-nourished.  HENT:  Right Ear: External ear normal.  Left Ear: External ear normal.  He has a couple small canker sores right side of tongue and right buccal mucosa. Posterior pharynx is normal  Neck: Neck supple.  Cardiovascular: Normal rate.   Pulmonary/Chest: Effort normal and breath sounds normal. No respiratory distress. He has no wheezes. He has no rales.  Lymphadenopathy:    He has no cervical adenopathy.          Assessment & Plan:  #1 canker sores involving right tongue and buccal mucosa. Magic mouthwash 4 times a day as needed #2 erectile dysfunction. Cialis 20 mg every other day as needed. He knows he cannot mix with nitroglycerin and he does not take any. #3 intermittent dry cough. Refill Tessalon for intermittent use

## 2013-04-12 NOTE — Telephone Encounter (Signed)
New problem   Pt calling for test results. Please call pt

## 2013-04-12 NOTE — Patient Instructions (Signed)
Canker Sores  Canker sores are painful, open sores on the inside of the mouth and cheek. They may be white or yellow. The sores usually heal in 1 to 2 weeks. Women are more likely than men to have recurrent canker sores. CAUSES The cause of canker sores is not well understood. More than one cause is likely. Canker sores do not appear to be caused by certain types of germs (viruses or bacteria). Canker sores may be caused by:  An allergic reaction to certain foods.  Digestive problems.  Not having enough vitamin B12, folic acid, and iron.  Male sex hormones. Sores may come only during certain phases of a menstrual cycle. Often, there is improvement during pregnancy.  Genetics. Some people seem to inherit canker sore problems. Emotional stress and injuries to the mouth may trigger outbreaks, but not cause them.  DIAGNOSIS Canker sores are diagnosed by exam.  TREATMENT  Patients who have frequent bouts of canker sores may have cultures taken of the sores, blood tests, or allergy tests. This helps determine if their sores are caused by a poor diet, an allergy, or some other preventable or treatable disease.  Vitamins may prevent recurrences or reduce the severity of canker sores in people with poor nutrition.  Numbing ointments can relieve pain. These are available in drug stores without a prescription.  Anti-inflammatory steroid mouth rinses or gels may be prescribed by your caregiver for severe sores.  Oral steroids may be prescribed if you have severe, recurrent canker sores. These strong medicines can cause many side effects and should be used only under the close direction of a dentist or physician.  Mouth rinses containing the antibiotic medicine may be prescribed. They may lessen symptoms and speed healing. Healing usually happens in about 1 or 2 weeks with or without treatment. Certain antibiotic mouth rinses given to pregnant women and young children can permanently stain teeth.  Talk to your caregiver about your treatment. HOME CARE INSTRUCTIONS   Avoid foods that cause canker sores for you.  Avoid citrus juices, spicy or salty foods, and coffee until the sores are healed.  Use a soft-bristled toothbrush.  Chew your food carefully to avoid biting your cheek.  Apply topical numbing medicine to the sore to help relieve pain.  Apply a thin paste of baking soda and water to the sore to help heal the sore.  Only use mouth rinses or medicines for pain or discomfort as directed by your caregiver. SEEK MEDICAL CARE IF:   Your symptoms are not better in 1 week.  Your sores are still present after 2 weeks.  Your sores are very painful.  You have trouble breathing or swallowing.  Your sores come back frequently. Document Released: 06/28/2010 Document Revised: 06/28/2012 Document Reviewed: 06/28/2010 [employer] Patient Information 2014 [employer].  

## 2013-04-12 NOTE — Telephone Encounter (Signed)
Spoke with pt, aware of echo results. 

## 2013-04-12 NOTE — Progress Notes (Signed)
Pre visit review using our clinic review tool, if applicable. No additional management support is needed unless otherwise documented below in the visit note. 

## 2013-04-25 ENCOUNTER — Encounter: Payer: Self-pay | Admitting: Family Medicine

## 2013-04-25 ENCOUNTER — Ambulatory Visit (INDEPENDENT_AMBULATORY_CARE_PROVIDER_SITE_OTHER): Payer: BC Managed Care – PPO | Admitting: Family Medicine

## 2013-04-25 VITALS — BP 112/80 | HR 68 | Temp 97.2°F | Wt 209.0 lb

## 2013-04-25 DIAGNOSIS — K5900 Constipation, unspecified: Secondary | ICD-10-CM

## 2013-04-25 NOTE — Progress Notes (Signed)
Pre visit review using our clinic review tool, if applicable. No additional management support is needed unless otherwise documented below in the visit note. 

## 2013-04-25 NOTE — Patient Instructions (Signed)
Constipation, Adult Constipation is when a person has fewer than 3 bowel movements a week; has difficulty having a bowel movement; or has stools that are dry, hard, or larger than normal. As people grow older, constipation is more common. If you try to fix constipation with medicines that make you have a bowel movement (laxatives), the problem may get worse. Long-term laxative use may cause the muscles of the colon to become weak. A low-fiber diet, not taking in enough fluids, and taking certain medicines may make constipation worse. CAUSES   Certain medicines, such as antidepressants, pain medicine, iron supplements, antacids, and water pills.   Certain diseases, such as diabetes, irritable bowel syndrome (IBS), thyroid disease, or depression.   Not drinking enough water.   Not eating enough fiber-rich foods.   Stress or travel.  Lack of physical activity or exercise.  Not going to the restroom when there is the urge to have a bowel movement.  Ignoring the urge to have a bowel movement.  Using laxatives too much. SYMPTOMS   Having fewer than 3 bowel movements a week.   Straining to have a bowel movement.   Having hard, dry, or larger than normal stools.   Feeling full or bloated.   Pain in the lower abdomen.  Not feeling relief after having a bowel movement. DIAGNOSIS  Your caregiver will take a medical history and perform a physical exam. Further testing may be done for severe constipation. Some tests may include:   A barium enema X-ray to examine your rectum, colon, and sometimes, your small intestine.  A sigmoidoscopy to examine your lower colon.  A colonoscopy to examine your entire colon. TREATMENT  Treatment will depend on the severity of your constipation and what is causing it. Some dietary treatments include drinking more fluids and eating more fiber-rich foods. Lifestyle treatments may include regular exercise. If these diet and lifestyle recommendations  do not help, your caregiver may recommend taking over-the-counter laxative medicines to help you have bowel movements. Prescription medicines may be prescribed if over-the-counter medicines do not work.  HOME CARE INSTRUCTIONS   Increase dietary fiber in your diet, such as fruits, vegetables, whole grains, and beans. Limit high-fat and processed sugars in your diet, such as [place] fries, hamburgers, cookies, candies, and soda.   A fiber supplement may be added to your diet if you cannot get enough fiber from foods.   Drink enough fluids to keep your urine clear or pale yellow.   Exercise regularly or as directed by your caregiver.   Go to the restroom when you have the urge to go. Do not hold it.  Only take medicines as directed by your caregiver. Do not take other medicines for constipation without talking to your caregiver first. [place] IF:   You have bright red blood in your stool.   Your constipation lasts for more than 4 days or gets worse.   You have abdominal or rectal pain.   You have thin, pencil-like stools.  You have unexplained weight loss. MAKE SURE YOU:   Understand these instructions.  Will watch your condition.  Will get help right away if you are not doing well or get worse. Document Released: 11/30/2003 Document Revised: 05/26/2011 Document Reviewed: 12/13/2012 [facility] Patient Information 2014 [place], [place].  Hold Nortriptyline and Librax for now to see if this helps with constipation Increase walking. Consider miralax short term.

## 2013-04-25 NOTE — Progress Notes (Signed)
   Subjective:    Patient ID: Peter Snyder, male    DOB: 15-May-1955, 58 y.o.   MRN: [id number]  GI Problem Primary symptoms do not include abdominal pain, nausea, vomiting or diarrhea.  The illness is also significant for constipation.   Patient seen with complaints of constipation. He is actually going fairly regular BMs but is having pellet-like stools and sometimes straining and also hard stools. No blood in his bowel movements. No abdominal pain. No appetite or weight changes. He has blood work pending next week for thyroid and other labs  He has apparently been for several years on low-dose nortriptyline 10 mg at night per psychiatrist. He also takes Librax occasionally for GI issues. He had colonoscopy back in October 2013 which was normal He appears to be drinking adequate fluids. Has tried Metamucil without much change in bowel movements.  Past Medical History  Diagnosis Date  . Arthritis   . Chicken pox   . Depression   . GERD (gastroesophageal reflux disease)   . CAD (coronary artery disease) 6/14    Cardiac catheterization 6/27/ 2014 ejection fraction 35-40%, 30% proximal left circumflex, 100% tiny obtuse marginal 1 with collaterals, 50% LAD, 50% D1, 100% RCA with collaterals  . Hypertension   . Hyperlipidemia   . Kidney stone   . Colon polyps   . Diabetes mellitus     Diet controlled   Past Surgical History  Procedure Laterality Date  . Tonsillectomy  1963    reports that he has never smoked. He does not have any smokeless tobacco history on file. He reports that he does not drink alcohol or use illicit drugs. family history includes Arthritis in his father and paternal grandmother; Heart disease in his maternal grandmother; Hyperlipidemia in his maternal grandmother. Allergies  Allergen Reactions  . Sulfa Antibiotics       Review of Systems  Constitutional: Negative for appetite change and unexpected weight change.  Gastrointestinal: Positive for  constipation. Negative for nausea, vomiting, abdominal pain, diarrhea and blood in stool.       Objective:   Physical Exam  Constitutional: He appears well-developed and well-nourished.  Cardiovascular: Normal rate.   Pulmonary/Chest: Effort normal and breath sounds normal. No respiratory distress. He has no wheezes. He has no rales.  Abdominal: Soft. Bowel sounds are normal. He exhibits no distension and no mass. There is no tenderness. There is no rebound and no guarding.          Assessment & Plan:  Constipation. Likely exacerbated by nortriptyline and Librax. Not clear that he needs to be on either of these medications at this time. Would recommend holding both medications. Continue adequate fiber and fluid intake. Continue regular walking program. Consider short-term use only of MiraLax. TSH to be checked soon.

## 2013-04-27 ENCOUNTER — Telehealth: Payer: Self-pay | Admitting: Family Medicine

## 2013-04-27 NOTE — Telephone Encounter (Signed)
Pt calling regarding constipation. Message left to call back on voice mail.

## 2013-04-27 NOTE — Telephone Encounter (Signed)
Patient Information:  Caller Name: Vinnie  Phone: [phone]  Patient: Peter Snyder  Gender: Male  DOB: [birth year]  Age: 58 Years  PCP: Carolann Littler [facility])  Office Follow Up:  Does the office need to follow up with this patient?: No  Instructions For The Office: N/A  RN Note:  Pt has a good regular BM today after taking Miralax per MD instructions. He ? how long to take this. Advised per EPIC to use short term. May take Miralax tonight. Continue his good dietary changes.  Symptoms  Reason For Call & Symptoms: Constipation-med ?  Reviewed Health History In EMR: Yes  Reviewed Medications In EMR: Yes  Reviewed Allergies In EMR: Yes  Reviewed Surgeries / Procedures: No  Date of Onset of Symptoms: 04/25/2013  Treatments Tried: Miralax  Treatments Tried Worked: Yes  Guideline(s) Used:  No Protocol Available - Information Only  Disposition Per Guideline:   Home Care  Reason For Disposition Reached:   Information only question and nurse able to answer  Advice Given:  Call Back If:  New symptoms develop  You become worse.  Patient Will Follow Care Advice:  YES

## 2013-04-28 ENCOUNTER — Ambulatory Visit (INDEPENDENT_AMBULATORY_CARE_PROVIDER_SITE_OTHER): Payer: BC Managed Care – PPO | Admitting: Family Medicine

## 2013-04-28 ENCOUNTER — Encounter: Payer: Self-pay | Admitting: Family Medicine

## 2013-04-28 VITALS — BP 132/80 | HR 71 | Temp 97.7°F | Wt 208.0 lb

## 2013-04-28 DIAGNOSIS — K5900 Constipation, unspecified: Secondary | ICD-10-CM

## 2013-04-28 NOTE — Progress Notes (Signed)
   Subjective:    Patient ID: Peter Snyder, male    DOB: October 18, 1955, 58 y.o.   MRN: [id number]  Constipation Pertinent negatives include no abdominal pain, fever, nausea or vomiting.   Patient here again to discuss constipation issues. Refer to prior note:  "Patient seen with complaints of constipation.  He is actually going fairly regular BMs but is having pellet-like stools and sometimes straining and also hard stools. No blood in his bowel movements. No abdominal pain. No appetite or weight changes.  He has blood work pending next week for thyroid and other labs  He has apparently been for several years on low-dose nortriptyline 10 mg at night per psychiatrist. He also takes Librax occasionally for GI issues. He had colonoscopy back in October 2013 which was normal  He appears to be drinking adequate fluids.  Has tried Metamucil without much change in bowel movements."  We recommended stopping his Librax and Pamelor which he did just couple days ago. He remains on Metamucil and intermittent MiraLax. He is still having mostly daily bowel movements but sometimes has small caliber stools. He's drinking plenty of liquids and walking regularly. He has not had any abdominal pain or distention. No bloody stools. Generally feels well.  Past Medical History  Diagnosis Date  . Arthritis   . Chicken pox   . Depression   . GERD (gastroesophageal reflux disease)   . CAD (coronary artery disease) 6/14    Cardiac catheterization 6/27/ 2014 ejection fraction 35-40%, 30% proximal left circumflex, 100% tiny obtuse marginal 1 with collaterals, 50% LAD, 50% D1, 100% RCA with collaterals  . Hypertension   . Hyperlipidemia   . Kidney stone   . Colon polyps   . Diabetes mellitus     Diet controlled   Past Surgical History  Procedure Laterality Date  . Tonsillectomy  1963    reports that he has never smoked. He does not have any smokeless tobacco history on file. He reports that he does not drink  alcohol or use illicit drugs. family history includes Arthritis in his father and paternal grandmother; Heart disease in his maternal grandmother; Hyperlipidemia in his maternal grandmother. Allergies  Allergen Reactions  . Sulfa Antibiotics        Review of Systems  Constitutional: Negative for fever, chills, appetite change and unexpected weight change.  Gastrointestinal: Positive for constipation. Negative for nausea, vomiting, abdominal pain and abdominal distention.       Objective:   Physical Exam  Constitutional: He appears well-developed.  Cardiovascular: Normal rate and regular rhythm.   Pulmonary/Chest: Effort normal and breath sounds normal. No respiratory distress. He has no wheezes. He has no rales.  Abdominal: Soft. Bowel sounds are normal. He exhibits no distension. There is no tenderness. There is no rebound and no guarding.  Genitourinary: Rectum normal and prostate normal.  No impaction. He has actually very minimal stool in the rectal vault. Prostate is normal size with no nodules and nontender          Assessment & Plan:  Constipation. Continue to avoid anti-cholinergics such as Pamelor or Librax. He has no impaction. Continue fiber supplement. He is instructed not to take laxatives regularly. Check TSH tomorrow with labs for physical

## 2013-04-28 NOTE — Progress Notes (Signed)
Pre visit review using our clinic review tool, if applicable. No additional management support is needed unless otherwise documented below in the visit note. 

## 2013-04-29 ENCOUNTER — Other Ambulatory Visit (INDEPENDENT_AMBULATORY_CARE_PROVIDER_SITE_OTHER): Payer: BC Managed Care – PPO

## 2013-04-29 ENCOUNTER — Other Ambulatory Visit: Payer: BC Managed Care – PPO | Admitting: Family Medicine

## 2013-04-29 DIAGNOSIS — E785 Hyperlipidemia, unspecified: Secondary | ICD-10-CM

## 2013-04-29 DIAGNOSIS — N400 Enlarged prostate without lower urinary tract symptoms: Secondary | ICD-10-CM

## 2013-04-29 DIAGNOSIS — I10 Essential (primary) hypertension: Secondary | ICD-10-CM

## 2013-04-29 LAB — CBC WITH DIFFERENTIAL/PLATELET
BASOS ABS: 0.1 10*3/uL (ref 0.0–0.1)
Basophils Relative: 0.7 % (ref 0.0–3.0)
Eosinophils Absolute: 0.1 10*3/uL (ref 0.0–0.7)
Eosinophils Relative: 1.2 % (ref 0.0–5.0)
HCT: 45.3 % (ref 39.0–52.0)
Hemoglobin: 14.9 g/dL (ref 13.0–17.0)
Lymphocytes Relative: 28.4 % (ref 12.0–46.0)
Lymphs Abs: 2.1 10*3/uL (ref 0.7–4.0)
MCHC: 32.8 g/dL (ref 30.0–36.0)
MCV: 95.2 fl (ref 78.0–100.0)
MONO ABS: 0.5 10*3/uL (ref 0.1–1.0)
Monocytes Relative: 7.3 % (ref 3.0–12.0)
NEUTROS PCT: 62.4 % (ref 43.0–77.0)
Neutro Abs: 4.6 10*3/uL (ref 1.4–7.7)
PLATELETS: 166 10*3/uL (ref 150.0–400.0)
RBC: 4.76 Mil/uL (ref 4.22–5.81)
RDW: 12.9 % (ref 11.5–14.6)
WBC: 7.4 10*3/uL (ref 4.5–10.5)

## 2013-04-29 LAB — LIPID PANEL
Cholesterol: 175 mg/dL (ref 0–200)
HDL: 42.3 mg/dL (ref >39.00)
LDL Cholesterol: 110 mg/dL — ABNORMAL HIGH (ref 0–99)
TRIGLYCERIDES: 113 mg/dL (ref 0.0–149.0)
Total CHOL/HDL Ratio: 4
VLDL: 22.6 mg/dL (ref 0.0–40.0)

## 2013-04-29 LAB — BASIC METABOLIC PANEL
BUN: 17 mg/dL (ref 6–23)
CO2: 31 mEq/L (ref 19–32)
CREATININE: 1 mg/dL (ref 0.4–1.5)
Calcium: 9.9 mg/dL (ref 8.4–10.5)
Chloride: 102 mEq/L (ref 96–112)
GFR: 82.53 mL/min (ref >60.00)
Glucose, Bld: 107 mg/dL — ABNORMAL HIGH (ref 70–99)
Potassium: 4.9 mEq/L (ref 3.5–5.1)
SODIUM: 141 meq/L (ref 135–145)

## 2013-04-29 LAB — HEPATIC FUNCTION PANEL
ALBUMIN: 4.3 g/dL (ref 3.5–5.2)
ALK PHOS: 75 U/L (ref 39–117)
ALT: 16 U/L (ref 0–53)
AST: 15 U/L (ref 0–37)
Bilirubin, Direct: 0.1 mg/dL (ref 0.0–0.3)
Total Bilirubin: 0.9 mg/dL (ref 0.3–1.2)
Total Protein: 6.8 g/dL (ref 6.0–8.3)

## 2013-04-29 LAB — PSA: PSA: 1.14 ng/mL (ref 0.10–4.00)

## 2013-04-29 LAB — TSH: TSH: 2.46 u[IU]/mL (ref 0.35–5.50)

## 2013-04-29 LAB — HEMOGLOBIN A1C: Hgb A1c MFr Bld: 6.3 % (ref 4.6–6.5)

## 2013-05-04 ENCOUNTER — Encounter: Payer: Self-pay | Admitting: Family Medicine

## 2013-05-04 ENCOUNTER — Ambulatory Visit (INDEPENDENT_AMBULATORY_CARE_PROVIDER_SITE_OTHER): Payer: BC Managed Care – PPO | Admitting: Family Medicine

## 2013-05-04 VITALS — BP 110/80 | HR 74 | Wt 211.0 lb

## 2013-05-04 DIAGNOSIS — R7303 Prediabetes: Secondary | ICD-10-CM

## 2013-05-04 DIAGNOSIS — R7309 Other abnormal glucose: Secondary | ICD-10-CM

## 2013-05-04 DIAGNOSIS — I10 Essential (primary) hypertension: Secondary | ICD-10-CM

## 2013-05-04 DIAGNOSIS — K5900 Constipation, unspecified: Secondary | ICD-10-CM

## 2013-05-04 DIAGNOSIS — E785 Hyperlipidemia, unspecified: Secondary | ICD-10-CM

## 2013-05-04 MED ORDER — FLUTICASONE PROPIONATE 50 MCG/ACT NA SUSP: 2.0000 | Freq: Every day | NASAL | Status: DC

## 2013-05-04 MED ORDER — EZETIMIBE 10 MG PO TABS: 10.0000 mg | ORAL_TABLET | Freq: Every day | ORAL | Status: DC

## 2013-05-04 NOTE — Progress Notes (Signed)
   Subjective:    Patient ID: Peter Snyder, male    DOB: 1955/12/25, 58 y.o.   MRN: [id number]  HPI Followup regarding dyslipidemia. Patient has history of CAD and ischemic cardiomyopathy. Takes high-dose Lipitor 80 mg once daily. Recent labs obtained. LDL 110. Triglycerides were at goal. HDL 42.  Patient remains on lisinopril and carvedilol. He has occasional lightheadedness but no significant orthostatic changes. No recent chest pains.  Constipation from last visit has resolved. He recently stopped Librax and nortriptyline and started regular fiber supplementation.   Past Medical History  Diagnosis Date  . Arthritis   . Chicken pox   . Depression   . GERD (gastroesophageal reflux disease)   . CAD (coronary artery disease) 6/14    Cardiac catheterization 6/27/ 2014 ejection fraction 35-40%, 30% proximal left circumflex, 100% tiny obtuse marginal 1 with collaterals, 50% LAD, 50% D1, 100% RCA with collaterals  . Hypertension   . Hyperlipidemia   . Kidney stone   . Colon polyps   . Diabetes mellitus     Diet controlled   Past Surgical History  Procedure Laterality Date  . Tonsillectomy  1963    reports that he has never smoked. He does not have any smokeless tobacco history on file. He reports that he does not drink alcohol or use illicit drugs. family history includes Arthritis in his father and paternal grandmother; Heart disease in his maternal grandmother; Hyperlipidemia in his maternal grandmother. Allergies  Allergen Reactions  . Sulfa Antibiotics      Review of Systems  Constitutional: Negative for fatigue.  Eyes: Negative for visual disturbance.  Respiratory: Negative for cough, chest tightness and shortness of breath.   Cardiovascular: Negative for chest pain, palpitations and leg swelling.  Neurological: Negative for dizziness, syncope, weakness, light-headedness and headaches.       Objective:   Physical Exam  Constitutional: He appears well-developed and  well-nourished.  Cardiovascular: Normal rate.  Exam reveals no gallop.   Pulmonary/Chest: Effort normal and breath sounds normal. No respiratory distress. He has no wheezes. He has no rales.  Musculoskeletal: He exhibits no edema.          Assessment & Plan:  #1 dyslipidemia. LDL 110. Add Zetia 10 mg once daily. Continue Lipitor. Repeat lipids in 8 weeks #2 hypertension stable and at goal.  #3 history prediabetes. We discussed measures to reduce risk of diabetes. Continue exercise and weight control efforts #4 constipation which is improved with discontinuation of nortriptyline and Librax #5 prediabetes.  A1C stable.  Discussed weight control strategies.

## 2013-05-04 NOTE — Patient Instructions (Signed)
Fat and Cholesterol Control Diet  Fat and cholesterol levels in your blood and organs are influenced by your diet. High levels of fat and cholesterol may lead to diseases of the heart, small and large blood vessels, gallbladder, liver, and pancreas.  CONTROLLING FAT AND CHOLESTEROL WITH DIET  Although exercise and lifestyle factors are important, your diet is key. That is because certain foods are known to raise cholesterol and others to lower it. The goal is to balance foods for their effect on cholesterol and more importantly, to replace saturated and trans fat with other types of fat, such as monounsaturated fat, polyunsaturated fat, and omega-3 fatty acids.  On average, a person should consume no more than 15 to 17 g of saturated fat daily. Saturated and trans fats are considered "bad" fats, and they will raise LDL cholesterol. Saturated fats are primarily found in animal products such as meats, butter, and cream. However, that does not mean you need to give up all your favorite foods. Today, there are good tasting, low-fat, low-cholesterol substitutes for most of the things you like to eat. Choose low-fat or nonfat alternatives. Choose round or loin cuts of red meat. These types of cuts are lowest in fat and cholesterol. Chicken (without the skin), fish, veal, and ground turkey breast are great choices. Eliminate fatty meats, such as hot dogs and salami. Even shellfish have little or no saturated fat. Have a 3 oz (85 g) portion when you eat lean meat, poultry, or fish.  Trans fats are also called "partially hydrogenated oils." They are oils that have been scientifically manipulated so that they are solid at room temperature resulting in a longer shelf life and improved taste and texture of foods in which they are added. Trans fats are found in stick margarine, some tub margarines, cookies, crackers, and baked goods.   When baking and cooking, oils are a great substitute for butter. The monounsaturated oils are  especially beneficial since it is believed they lower LDL and raise HDL. The oils you should avoid entirely are saturated tropical oils, such as coconut and palm.   Remember to eat a lot from food groups that are naturally free of saturated and trans fat, including fish, fruit, vegetables, beans, grains (barley, rice, couscous, bulgur wheat), and pasta (without cream sauces).   IDENTIFYING FOODS THAT LOWER FAT AND CHOLESTEROL   Soluble fiber may lower your cholesterol. This type of fiber is found in fruits such as apples, vegetables such as broccoli, potatoes, and carrots, legumes such as beans, peas, and lentils, and grains such as barley. Foods fortified with plant sterols (phytosterol) may also lower cholesterol. You should eat at least 2 g per day of these foods for a cholesterol lowering effect.   Read package labels to identify low-saturated fats, trans fat free, and low-fat foods at the supermarket. Select cheeses that have only 2 to 3 g saturated fat per ounce. Use a heart-healthy tub margarine that is free of trans fats or partially hydrogenated oil. When buying baked goods (cookies, crackers), avoid partially hydrogenated oils. Breads and muffins should be made from whole grains (whole-wheat or whole oat flour, instead of "flour" or "enriched flour"). Buy non-creamy canned soups with reduced salt and no added fats.   FOOD PREPARATION TECHNIQUES   Never deep-fry. If you must fry, either stir-fry, which uses very little fat, or use non-stick cooking sprays. When possible, broil, bake, or roast meats, and steam vegetables. Instead of putting butter or margarine on vegetables, use lemon   and herbs, applesauce, and cinnamon (for squash and sweet potatoes). Use nonfat yogurt, salsa, and low-fat dressings for salads.   LOW-SATURATED FAT / LOW-FAT FOOD SUBSTITUTES  Meats / Saturated Fat (g)  · Avoid: Steak, marbled (3 oz/85 g) / 11 g  · Choose: Steak, lean (3 oz/85 g) / 4 g  · Avoid: Hamburger (3 oz/85 g) / 7  g  · Choose: Hamburger, lean (3 oz/85 g) / 5 g  · Avoid: Ham (3 oz/85 g) / 6 g  · Choose: Ham, lean cut (3 oz/85 g) / 2.4 g  · Avoid: Chicken, with skin, dark meat (3 oz/85 g) / 4 g  · Choose: Chicken, skin removed, dark meat (3 oz/85 g) / 2 g  · Avoid: Chicken, with skin, light meat (3 oz/85 g) / 2.5 g  · Choose: Chicken, skin removed, light meat (3 oz/85 g) / 1 g  Dairy / Saturated Fat (g)  · Avoid: Whole milk (1 cup) / 5 g  · Choose: Low-fat milk, 2% (1 cup) / 3 g  · Choose: Low-fat milk, 1% (1 cup) / 1.5 g  · Choose: Skim milk (1 cup) / 0.3 g  · Avoid: Hard cheese (1 oz/28 g) / 6 g  · Choose: Skim milk cheese (1 oz/28 g) / 2 to 3 g  · Avoid: Cottage cheese, 4% fat (1 cup) / 6.5 g  · Choose: Low-fat cottage cheese, 1% fat (1 cup) / 1.5 g  · Avoid: Ice cream (1 cup) / 9 g  · Choose: Sherbet (1 cup) / 2.5 g  · Choose: Nonfat frozen yogurt (1 cup) / 0.3 g  · Choose: Frozen fruit bar / trace  · Avoid: Whipped cream (1 tbs) / 3.5 g  · Choose: Nondairy whipped topping (1 tbs) / 1 g  Condiments / Saturated Fat (g)  · Avoid: Mayonnaise (1 tbs) / 2 g  · Choose: Low-fat mayonnaise (1 tbs) / 1 g  · Avoid: Butter (1 tbs) / 7 g  · Choose: Extra light margarine (1 tbs) / 1 g  · Avoid: Coconut oil (1 tbs) / 11.8 g  · Choose: Olive oil (1 tbs) / 1.8 g  · Choose: Corn oil (1 tbs) / 1.7 g  · Choose: Safflower oil (1 tbs) / 1.2 g  · Choose: Sunflower oil (1 tbs) / 1.4 g  · Choose: Soybean oil (1 tbs) / 2.4 g  · Choose: Canola oil (1 tbs) / 1 g  Document Released: 03/03/2005 Document Revised: 06/28/2012 Document Reviewed: 08/22/2010  Exit[employer]® Patient Information ©2014 [employer].

## 2013-05-04 NOTE — Progress Notes (Signed)
Pre visit review using our clinic review tool, if applicable. No additional management support is needed unless otherwise documented below in the visit note. 

## 2013-05-05 ENCOUNTER — Telehealth: Payer: Self-pay | Admitting: Family Medicine

## 2013-05-05 NOTE — Telephone Encounter (Signed)
Relevant patient education assigned to patient using Emmi. ° °

## 2013-05-09 ENCOUNTER — Ambulatory Visit (INDEPENDENT_AMBULATORY_CARE_PROVIDER_SITE_OTHER): Payer: BC Managed Care – PPO | Admitting: Family Medicine

## 2013-05-09 ENCOUNTER — Encounter: Payer: Self-pay | Admitting: Family Medicine

## 2013-05-09 VITALS — BP 128/80 | HR 64 | Temp 97.6°F

## 2013-05-09 DIAGNOSIS — J209 Acute bronchitis, unspecified: Secondary | ICD-10-CM

## 2013-05-09 MED ORDER — HYDROCODONE-HOMATROPINE 5-1.5 MG/5ML PO SYRP: 5.0000 mL | ORAL_SOLUTION | Freq: Four times a day (QID) | ORAL | Status: DC | PRN

## 2013-05-09 MED ORDER — BENZONATATE 200 MG PO CAPS: 200.0000 mg | ORAL_CAPSULE | Freq: Three times a day (TID) | ORAL | Status: DC | PRN

## 2013-05-09 NOTE — Progress Notes (Signed)
Pre visit review using our clinic review tool, if applicable. No additional management support is needed unless otherwise documented below in the visit note. 

## 2013-05-09 NOTE — Patient Instructions (Signed)
Acute Bronchitis Bronchitis is inflammation of the airways that extend from the windpipe into the lungs (bronchi). The inflammation often causes mucus to develop. This leads to a cough, which is the most common symptom of bronchitis.  In acute bronchitis, the condition usually develops suddenly and goes away over time, usually in a couple weeks. Smoking, allergies, and asthma can make bronchitis worse. Repeated episodes of bronchitis may cause further lung problems.  CAUSES Acute bronchitis is most often caused by the same virus that causes a cold. The virus can spread from person to person (contagious).  SIGNS AND SYMPTOMS   Cough.   Fever.   Coughing up mucus.   Body aches.   Chest congestion.   Chills.   Shortness of breath.   Sore throat.  DIAGNOSIS  Acute bronchitis is usually diagnosed through a physical exam. Tests, such as chest X-rays, are sometimes done to rule out other conditions.  TREATMENT  Acute bronchitis usually goes away in a couple weeks. Often times, no medical treatment is necessary. Medicines are sometimes given for relief of fever or cough. Antibiotics are usually not needed but may be prescribed in certain situations. In some cases, an inhaler may be recommended to help reduce shortness of breath and control the cough. A cool mist vaporizer may also be used to help thin bronchial secretions and make it easier to clear the chest.  HOME CARE INSTRUCTIONS  Get plenty of rest.   Drink enough fluids to keep your urine clear or pale yellow (unless you have a medical condition that requires fluid restriction). Increasing fluids may help thin your secretions and will prevent dehydration.   Only take over-the-counter or prescription medicines as directed by your health care provider.   Avoid smoking and secondhand smoke. Exposure to cigarette smoke or irritating chemicals will make bronchitis worse. If you are a smoker, consider using nicotine gum or skin  patches to help control withdrawal symptoms. Quitting smoking will help your lungs heal faster.   Reduce the chances of another bout of acute bronchitis by washing your hands frequently, avoiding people with cold symptoms, and trying not to touch your hands to your mouth, nose, or eyes.   Follow up with your health care provider as directed.  SEEK MEDICAL CARE IF: Your symptoms do not improve after 1 week of treatment.  SEEK IMMEDIATE MEDICAL CARE IF:  You develop an increased fever or chills.   You have chest pain.   You have severe shortness of breath.  You have bloody sputum.   You develop dehydration.  You develop fainting.  You develop repeated vomiting.  You develop a severe headache. MAKE SURE YOU:   Understand these instructions.  Will watch your condition.  Will get help right away if you are not doing well or get worse. Document Released: 04/10/2004 Document Revised: 11/03/2012 Document Reviewed: 08/24/2012 [employer] Patient Information 2014 [employer].  

## 2013-05-09 NOTE — Progress Notes (Signed)
   Subjective:    Patient ID: Peter Snyder, male    DOB: May 20, 1955, 58 y.o.   MRN: [id number]  Cough Pertinent negatives include no chills, fever, shortness of breath or wheezing.   Acute visit. Patient seen with 2 day history of cough which is mostly nonproductive. Had some nasal congestion. No fever. No dyspnea. No wheezing. He's taken some Tessalon Perles without much improvement. Patient nonsmoker. No obvious wheezing.  Past Medical History  Diagnosis Date  . Arthritis   . Chicken pox   . Depression   . GERD (gastroesophageal reflux disease)   . CAD (coronary artery disease) 6/14    Cardiac catheterization 6/27/ 2014 ejection fraction 35-40%, 30% proximal left circumflex, 100% tiny obtuse marginal 1 with collaterals, 50% LAD, 50% D1, 100% RCA with collaterals  . Hypertension   . Hyperlipidemia   . Kidney stone   . Colon polyps   . Diabetes mellitus     Diet controlled   Past Surgical History  Procedure Laterality Date  . Tonsillectomy  1963    reports that he has never smoked. He does not have any smokeless tobacco history on file. He reports that he does not drink alcohol or use illicit drugs. family history includes Arthritis in his father and paternal grandmother; Heart disease in his maternal grandmother; Hyperlipidemia in his maternal grandmother. Allergies  Allergen Reactions  . Sulfa Antibiotics       Review of Systems  Constitutional: Negative for fever and chills.  HENT: Positive for congestion.   Respiratory: Positive for cough. Negative for shortness of breath and wheezing.        Objective:   Physical Exam  Constitutional: He appears well-developed and well-nourished.  HENT:  Right Ear: External ear normal.  Left Ear: External ear normal.  Mouth/Throat: Oropharynx is clear and moist.  Neck: Neck supple.  Cardiovascular: Normal rate.   Pulmonary/Chest: Effort normal and breath sounds normal. No respiratory distress. He has no wheezes. He has no  rales.  Lymphadenopathy:    He has no cervical adenopathy.          Assessment & Plan:  Acute upper*infection. Refill Tessalon. Hycodan cough syrup 1 teaspoon each bedtime for severe cough. Reassurance. Follow up as needed

## 2013-05-11 ENCOUNTER — Encounter: Payer: Self-pay | Admitting: Family Medicine

## 2013-05-11 ENCOUNTER — Ambulatory Visit (INDEPENDENT_AMBULATORY_CARE_PROVIDER_SITE_OTHER): Payer: BC Managed Care – PPO | Admitting: Family Medicine

## 2013-05-11 VITALS — BP 126/80 | HR 69 | Temp 97.8°F

## 2013-05-11 DIAGNOSIS — B9789 Other viral agents as the cause of diseases classified elsewhere: Principal | ICD-10-CM

## 2013-05-11 DIAGNOSIS — J069 Acute upper respiratory infection, unspecified: Secondary | ICD-10-CM

## 2013-05-11 MED ORDER — METHYLPREDNISOLONE ACETATE 80 MG/ML IJ SUSP
80.0000 mg | Freq: Once | INTRAMUSCULAR | Status: AC
  Administered 2013-05-11: 80 mg via INTRAMUSCULAR

## 2013-05-11 NOTE — Progress Notes (Signed)
Pre visit review using our clinic review tool, if applicable. No additional management support is needed unless otherwise documented below in the visit note. 

## 2013-05-11 NOTE — Patient Instructions (Signed)

## 2013-05-11 NOTE — Addendum Note (Signed)
Addended by: Marcina Millard on: 05/11/2013 10:56 AM   Modules accepted: Orders

## 2013-05-11 NOTE — Progress Notes (Signed)
   Subjective:    Patient ID: Peter Snyder, male    DOB: March 01, 1956, 58 y.o.   MRN: [id number]  Sore Throat  Associated symptoms include congestion and coughing. Pertinent negatives include no trouble swallowing.   Patient seen with persistent upper respiratory symptoms. Sore throat, laryngitis, mostly nonproductive cough increased malaise. No fever. He is using Hycodan cough syrup at night which helped slightly with cough. He has had possibly some mild wheezing off and on. He has nasal congestion and avoids decongestants. He is requesting prednisone which is helpful similar symptoms in the past.  Past Medical History  Diagnosis Date  . Arthritis   . Chicken pox   . Depression   . GERD (gastroesophageal reflux disease)   . CAD (coronary artery disease) 6/14    Cardiac catheterization 6/27/ 2014 ejection fraction 35-40%, 30% proximal left circumflex, 100% tiny obtuse marginal 1 with collaterals, 50% LAD, 50% D1, 100% RCA with collaterals  . Hypertension   . Hyperlipidemia   . Kidney stone   . Colon polyps   . Diabetes mellitus     Diet controlled   Past Surgical History  Procedure Laterality Date  . Tonsillectomy  1963    reports that he has never smoked. He does not have any smokeless tobacco history on file. He reports that he does not drink alcohol or use illicit drugs. family history includes Arthritis in his father and paternal grandmother; Heart disease in his maternal grandmother; Hyperlipidemia in his maternal grandmother. Allergies  Allergen Reactions  . Sulfa Antibiotics       Review of Systems  Constitutional: Positive for fatigue. Negative for fever and chills.  HENT: Positive for congestion, sore throat and voice change. Negative for trouble swallowing.   Respiratory: Positive for cough.   Cardiovascular: Negative for chest pain.       Objective:   Physical Exam  Constitutional: He appears well-developed and well-nourished.  HENT:  Right Ear: External ear  normal.  Left Ear: External ear normal.  Mouth/Throat: Oropharynx is clear and moist.  Neck: Neck supple.  Cardiovascular: Normal rate and regular rhythm.   Pulmonary/Chest: Effort normal and breath sounds normal. No respiratory distress. He has no wheezes. He has no rales.  Lymphadenopathy:    He has no cervical adenopathy.          Assessment & Plan:  Viral syndrome. Patient has possibly some mild reactive airway symptoms off and on. Depo-Medrol 80 mg IM given. Continue symptomatic measures. Followup as needed.

## 2013-05-13 ENCOUNTER — Telehealth: Payer: Self-pay | Admitting: Family Medicine

## 2013-05-13 MED ORDER — HYDROCODONE-HOMATROPINE 5-1.5 MG/5ML PO SYRP: 5.0000 mL | ORAL_SOLUTION | Freq: Four times a day (QID) | ORAL | Status: AC | PRN

## 2013-05-13 NOTE — Telephone Encounter (Signed)
Refill once.  Try to NOT take regularly.  Use at night mostly for severe cough.

## 2013-05-13 NOTE — Telephone Encounter (Signed)
Pt informed and RX is ready for pick up

## 2013-05-13 NOTE — Telephone Encounter (Signed)
Pt needs another rx for hydrocodone cough med

## 2013-05-13 NOTE — Telephone Encounter (Signed)
Last visit 05/11/13 Last refill 05/09/13 18ml no refill

## 2013-05-16 ENCOUNTER — Ambulatory Visit (INDEPENDENT_AMBULATORY_CARE_PROVIDER_SITE_OTHER): Payer: BC Managed Care – PPO | Admitting: Family Medicine

## 2013-05-16 ENCOUNTER — Encounter: Payer: Self-pay | Admitting: Family Medicine

## 2013-05-16 VITALS — BP 128/78 | HR 83 | Temp 98.4°F | Wt 209.0 lb

## 2013-05-16 DIAGNOSIS — J019 Acute sinusitis, unspecified: Secondary | ICD-10-CM

## 2013-05-16 DIAGNOSIS — J209 Acute bronchitis, unspecified: Secondary | ICD-10-CM

## 2013-05-16 MED ORDER — TRAMADOL HCL 50 MG PO TABS: 50.0000 mg | ORAL_TABLET | Freq: Four times a day (QID) | ORAL | Status: DC | PRN

## 2013-05-16 MED ORDER — AMOXICILLIN-POT CLAVULANATE 875-125 MG PO TABS
1.0000 | ORAL_TABLET | Freq: Two times a day (BID) | ORAL | Status: DC
Start: 2013-05-16 — End: 2013-05-30

## 2013-05-16 MED ORDER — CARVEDILOL 12.5 MG PO TABS: ORAL_TABLET | ORAL | Status: DC

## 2013-05-16 MED ORDER — LISINOPRIL 5 MG PO TABS: 5.0000 mg | ORAL_TABLET | Freq: Every day | ORAL | Status: DC

## 2013-05-16 NOTE — Progress Notes (Signed)
Pre visit review using our clinic review tool, if applicable. No additional management support is needed unless otherwise documented below in the visit note. 

## 2013-05-16 NOTE — Progress Notes (Signed)
   Subjective:    Patient ID: Peter Snyder, male    DOB: 1955/04/01, 58 y.o.   MRN: [id number]  Cough Pertinent negatives include no chills, ear pain, fever, sore throat, shortness of breath or wheezing.   Persistent cough. Patient admits he has some anxiety with regard to the fact that he had pneumonia about 5 years ago and subsequent heart issue after dxed with pneumonia. This cough remains mostly nonproductive with occasional productive cough. Still no fever. No dyspnea. No wheezing. He has developed progressive sinus pain and frontal sinus headaches and yellowish and bloody nasal discharge. He is taking hydrocodone cough syrup at night with minimal relief in cough. He also try Tessalon without relief. No hemoptysis. No pleuritic pain.  Past Medical History  Diagnosis Date  . Arthritis   . Chicken pox   . Depression   . GERD (gastroesophageal reflux disease)   . CAD (coronary artery disease) 6/14    Cardiac catheterization 6/27/ 2014 ejection fraction 35-40%, 30% proximal left circumflex, 100% tiny obtuse marginal 1 with collaterals, 50% LAD, 50% D1, 100% RCA with collaterals  . Hypertension   . Hyperlipidemia   . Kidney stone   . Colon polyps   . Diabetes mellitus     Diet controlled   Past Surgical History  Procedure Laterality Date  . Tonsillectomy  1963    reports that he has never smoked. He does not have any smokeless tobacco history on file. He reports that he does not drink alcohol or use illicit drugs. family history includes Arthritis in his father and paternal grandmother; Heart disease in his maternal grandmother; Hyperlipidemia in his maternal grandmother. Allergies  Allergen Reactions  . Sulfa Antibiotics       Review of Systems  Constitutional: Positive for fatigue. Negative for fever, chills and appetite change.  HENT: Positive for congestion and sinus pressure. Negative for ear pain and sore throat.   Respiratory: Positive for cough. Negative for shortness  of breath and wheezing.        Objective:   Physical Exam  Constitutional: He appears well-developed and well-nourished.  HENT:  Right Ear: External ear normal.  Left Ear: External ear normal.  Mouth/Throat: Oropharynx is clear and moist.  Erythematous nasal mucosa. Some mild crusted blood right naris  Neck: Neck supple.  Cardiovascular: Normal rate and regular rhythm.   Pulmonary/Chest: Effort normal and breath sounds normal. No respiratory distress. He has no wheezes. He has no rales.  Lymphadenopathy:    He has no cervical adenopathy.          Assessment & Plan:  Persistent cough. Suspect acute viral bronchitis. He does have symptoms and findings suggesting possible acute/recurrent sinusitis. Augmentin 875 mg twice daily for 10 days. Continue Hycodan cough syrup at night. Trial of tramadol 50 mg one every 6 hours as needed for cough suppression. We again reiterated that average duration of cough for viral bronchitis is about 3 weeks

## 2013-05-27 ENCOUNTER — Telehealth: Payer: Self-pay | Admitting: Family Medicine

## 2013-05-27 MED ORDER — BENZONATATE 200 MG PO CAPS: ORAL_CAPSULE | ORAL | Status: DC

## 2013-05-27 NOTE — Telephone Encounter (Signed)
Pt states he has been suffering from bronchitis lately and usually is given a rx of benzonatate pearls to help.  Pt would like an rx called into his [facility] on [address]  Please advise. Thanks

## 2013-05-27 NOTE — Telephone Encounter (Signed)
Tessalon perles 200 mg every 8 hours prn cough #30

## 2013-05-27 NOTE — Telephone Encounter (Signed)
RX sent to pharmacy  

## 2013-05-30 ENCOUNTER — Ambulatory Visit (INDEPENDENT_AMBULATORY_CARE_PROVIDER_SITE_OTHER): Payer: BC Managed Care – PPO | Admitting: Family Medicine

## 2013-05-30 ENCOUNTER — Encounter: Payer: Self-pay | Admitting: Family Medicine

## 2013-05-30 VITALS — BP 122/76 | HR 71 | Wt 208.0 lb

## 2013-05-30 DIAGNOSIS — R42 Dizziness and giddiness: Secondary | ICD-10-CM

## 2013-05-30 NOTE — Progress Notes (Signed)
Pre visit review using our clinic review tool, if applicable. No additional management support is needed unless otherwise documented below in the visit note. 

## 2013-05-30 NOTE — Patient Instructions (Signed)
Stay well hydrated and consider replacement such as Gatorade especially with warmer months and increased perspiration.

## 2013-05-30 NOTE — Progress Notes (Signed)
   Subjective:    Patient ID: Peter Snyder, male    DOB: [birth year], 58 y.o.   MRN: [id number]  Dizziness Pertinent negatives include no abdominal pain, chest pain, chills, coughing, fever, headaches or weakness.   Patient seen with dizziness at times with standing. Had recent upper respiratory with acute bronchitis which is finally clearing. He denies any significant cough at this time -only rare cough. His symptoms are not related to cough. Symptoms noted when he first stands up and only last a few seconds. No syncopal episodes. He has history of CAD and history of ischemic heart myopathy. Takes low-dose lisinopril 5 mg daily and carvedilol 12.5 mg 2 at night one in the morning. He's been on this dosage for quite some time. Generally stays well hydrated. No history of anemia. Denies any recent nausea, vomiting, or diarrhea. Denies any recent chest pains whatsoever.  Past Medical History  Diagnosis Date  . Arthritis   . Chicken pox   . Depression   . GERD (gastroesophageal reflux disease)   . CAD (coronary artery disease) 6/14    Cardiac catheterization 6/27/ 2014 ejection fraction 35-40%, 30% proximal left circumflex, 100% tiny obtuse marginal 1 with collaterals, 50% LAD, 50% D1, 100% RCA with collaterals  . Hypertension   . Hyperlipidemia   . Kidney stone   . Colon polyps   . Diabetes mellitus     Diet controlled   Past Surgical History  Procedure Laterality Date  . Tonsillectomy  1963    reports that he has never smoked. He does not have any smokeless tobacco history on file. He reports that he does not drink alcohol or use illicit drugs. family history includes Arthritis in his father and paternal grandmother; Heart disease in his maternal grandmother; Hyperlipidemia in his maternal grandmother. Allergies  Allergen Reactions  . Sulfa Antibiotics       Review of Systems  Constitutional: Negative for fever and chills.  Respiratory: Negative for cough and shortness of breath.    Cardiovascular: Negative for chest pain.  Gastrointestinal: Negative for abdominal pain.  Genitourinary: Negative for dysuria.  Neurological: Positive for dizziness and light-headedness. Negative for syncope, weakness and headaches.  Hematological: Negative for adenopathy.       Objective:   Physical Exam  Constitutional: He is oriented to person, place, and time. He appears well-developed and well-nourished.  Cardiovascular: Normal rate.  Exam reveals no gallop.   No murmur heard. Pulmonary/Chest: Effort normal and breath sounds normal. No respiratory distress. He has no wheezes. He has no rales.  Musculoskeletal: He exhibits no edema.  Neurological: He is alert and oriented to person, place, and time. No cranial nerve deficit.  Psychiatric: He has a normal mood and affect. His behavior is normal.          Assessment & Plan:  Dizziness. He is describing some mild orthostasis. Sitting blood pressure left arm seated by my reading 118/62 and standing left arm 102/60. His pulse is regular and 72. We've recommended increasing hydration and consider electrolyte replacement such as Gatorade. If symptoms persist may need to reduce his carvedilol dosage

## 2013-06-09 ENCOUNTER — Other Ambulatory Visit: Payer: Self-pay | Admitting: Family Medicine

## 2013-06-16 ENCOUNTER — Ambulatory Visit (INDEPENDENT_AMBULATORY_CARE_PROVIDER_SITE_OTHER): Payer: BC Managed Care – PPO | Admitting: Family Medicine

## 2013-06-16 ENCOUNTER — Encounter: Payer: Self-pay | Admitting: Family Medicine

## 2013-06-16 VITALS — BP 132/80 | HR 64 | Wt 208.0 lb

## 2013-06-16 DIAGNOSIS — K644 Residual hemorrhoidal skin tags: Secondary | ICD-10-CM

## 2013-06-16 NOTE — Patient Instructions (Signed)
Hemorrhoids Hemorrhoids are swollen veins around the rectum or anus. There are two types of hemorrhoids:   Internal hemorrhoids. These occur in the veins just inside the rectum. They may poke through to the outside and become irritated and painful.  External hemorrhoids. These occur in the veins outside the anus and can be felt as a painful swelling or hard lump near the anus. CAUSES  Pregnancy.   Obesity.   Constipation or diarrhea.   Straining to have a bowel movement.   Sitting for long periods on the toilet.  Heavy lifting or other activity that caused you to strain.  Anal intercourse. SYMPTOMS   Pain.   Anal itching or irritation.   Rectal bleeding.   Fecal leakage.   Anal swelling.   One or more lumps around the anus.  DIAGNOSIS  Your caregiver may be able to diagnose hemorrhoids by visual examination. Other examinations or tests that may be performed include:   Examination of the rectal area with a gloved hand (digital rectal exam).   Examination of anal canal using a small tube (scope).   A blood test if you have lost a significant amount of blood.  A test to look inside the colon (sigmoidoscopy or colonoscopy). TREATMENT Most hemorrhoids can be treated at home. However, if symptoms do not seem to be getting better or if you have a lot of rectal bleeding, your caregiver may perform a procedure to help make the hemorrhoids get smaller or remove them completely. Possible treatments include:   Placing a rubber band at the base of the hemorrhoid to cut off the circulation (rubber band ligation).   Injecting a chemical to shrink the hemorrhoid (sclerotherapy).   Using a tool to burn the hemorrhoid (infrared light therapy).   Surgically removing the hemorrhoid (hemorrhoidectomy).   Stapling the hemorrhoid to block blood flow to the tissue (hemorrhoid stapling).  HOME CARE INSTRUCTIONS   Eat foods with fiber, such as whole grains, beans,  nuts, fruits, and vegetables. Ask your doctor about taking products with added fiber in them (fibersupplements).  Increase fluid intake. Drink enough water and fluids to keep your urine clear or pale yellow.   Exercise regularly.   Go to the bathroom when you have the urge to have a bowel movement. Do not wait.   Avoid straining to have bowel movements.   Keep the anal area dry and clean. Use wet toilet paper or moist towelettes after a bowel movement.   Medicated creams and suppositories may be used or applied as directed.   Only take over-the-counter or prescription medicines as directed by your caregiver.   Take warm sitz baths for 15 20 minutes, 3 4 times a day to ease pain and discomfort.   Place ice packs on the hemorrhoids if they are tender and swollen. Using ice packs between sitz baths may be helpful.   Put ice in a plastic bag.   Place a towel between your skin and the bag.   Leave the ice on for 15 20 minutes, 3 4 times a day.   Do not use a donut-shaped pillow or sit on the toilet for long periods. This increases blood pooling and pain.  SEEK MEDICAL CARE IF:  You have increasing pain and swelling that is not controlled by treatment or medicine.  You have uncontrolled bleeding.  You have difficulty or you are unable to have a bowel movement.  You have pain or inflammation outside the area of the hemorrhoids. MAKE SURE YOU:    Understand these instructions.  Will watch your condition.  Will get help right away if you are not doing well or get worse. Document Released: 02/29/2000 Document Revised: 02/18/2012 Document Reviewed: 01/06/2012 [facility] Patient Information 2014 [place].  Try over the counter Colace (stool softener)

## 2013-06-16 NOTE — Progress Notes (Signed)
Pre visit review using our clinic review tool, if applicable. No additional management support is needed unless otherwise documented below in the visit note. 

## 2013-06-16 NOTE — Progress Notes (Signed)
   Subjective:    Patient ID: Peter Snyder, male    DOB: [birth year], 58 y.o.   MRN: [id number]  HPI Patient seen with concern for possible hemorrhoids. He has some intermittent constipation issues. Recently discontinued his Librax and nortriptyline and he briefly did see some improvement. Did not try stool softeners nor has he tried any MiraLax. He does walk fairly often for exercise. He has had some bright blood occasionally with stools and frequent straining but has good regularity with bowel movements. Colonoscopy 2013 with reportedly 2 benign polyps which were removed. No history of any known diverticular disease.   Review of Systems  Constitutional: Negative for appetite change and unexpected weight change.  Gastrointestinal: Positive for constipation and blood in stool. Negative for abdominal pain.       Objective:   Physical Exam  Constitutional: He appears well-developed and well-nourished.  Cardiovascular: Normal rate.   Pulmonary/Chest: Effort normal and breath sounds normal. No respiratory distress. He has no wheezes. He has no rales.  Genitourinary:  Patient has a couple very small external hemorrhoids with no active bleeding. No visible anal fissure          Assessment & Plan:  External hemorrhoids. No active bleeding. No thrombosis. We discussed measures to reduce constipation. Try over-the-counter stool softener such as Colace regularly for the next few weeks. Followup as needed

## 2013-06-24 ENCOUNTER — Other Ambulatory Visit: Payer: Self-pay | Admitting: *Deleted

## 2013-06-24 ENCOUNTER — Other Ambulatory Visit (INDEPENDENT_AMBULATORY_CARE_PROVIDER_SITE_OTHER): Payer: BC Managed Care – PPO

## 2013-06-24 DIAGNOSIS — E785 Hyperlipidemia, unspecified: Secondary | ICD-10-CM

## 2013-06-24 LAB — LIPID PANEL
Cholesterol: 142 mg/dL (ref 0–200)
HDL: 43.4 mg/dL (ref >39.00)
LDL CALC: 67 mg/dL (ref 0–99)
TRIGLYCERIDES: 157 mg/dL — AB (ref 0.0–149.0)
Total CHOL/HDL Ratio: 3
VLDL: 31.4 mg/dL (ref 0.0–40.0)

## 2013-06-29 ENCOUNTER — Ambulatory Visit: Payer: BC Managed Care – PPO | Admitting: Family Medicine

## 2013-07-14 ENCOUNTER — Ambulatory Visit: Payer: BC Managed Care – PPO | Admitting: Family Medicine

## 2013-07-15 ENCOUNTER — Telehealth: Payer: Self-pay | Admitting: Cardiology

## 2013-07-15 NOTE — Telephone Encounter (Signed)
Pt called to make sure how much exercise he needs to do and how much he can exert himself , because he is hearing different things from different people. Pt state that he walks 30 minutes a day vigorously, and feels fine. Pt is aware that he can walk 30 minutes vigorously  or go an hour slow and if  he feels fine after doing it;  It is fine for him  to continue doing it  . Pt is aware that  he needs to listen to his body. Pt verbalized understanding.

## 2013-07-15 NOTE — Telephone Encounter (Signed)
New message    Patient calling took a little fall this am .   Please advise on how much exercise

## 2013-07-26 ENCOUNTER — Telehealth: Payer: Self-pay | Admitting: Cardiology

## 2013-07-26 ENCOUNTER — Emergency Department (HOSPITAL_COMMUNITY)
Admission: EM | Admit: 2013-07-26 | Discharge: 2013-07-26 | Disposition: A | Discharge status: Home / Self Care | Payer: BC Managed Care – PPO | Attending: Emergency Medicine | Admitting: Emergency Medicine

## 2013-07-26 ENCOUNTER — Telehealth: Payer: Self-pay | Admitting: Family Medicine

## 2013-07-26 ENCOUNTER — Encounter (HOSPITAL_COMMUNITY): Payer: Self-pay | Admitting: Emergency Medicine

## 2013-07-26 DIAGNOSIS — Z87442 Personal history of urinary calculi: Secondary | ICD-10-CM | POA: Insufficient documentation

## 2013-07-26 DIAGNOSIS — R5381 Other malaise: Secondary | ICD-10-CM | POA: Insufficient documentation

## 2013-07-26 DIAGNOSIS — K219 Gastro-esophageal reflux disease without esophagitis: Secondary | ICD-10-CM | POA: Insufficient documentation

## 2013-07-26 DIAGNOSIS — Z79899 Other long term (current) drug therapy: Secondary | ICD-10-CM | POA: Insufficient documentation

## 2013-07-26 DIAGNOSIS — Z7982 Long term (current) use of aspirin: Secondary | ICD-10-CM | POA: Insufficient documentation

## 2013-07-26 DIAGNOSIS — R5383 Other fatigue: Principal | ICD-10-CM

## 2013-07-26 DIAGNOSIS — I2510 Atherosclerotic heart disease of native coronary artery without angina pectoris: Secondary | ICD-10-CM | POA: Insufficient documentation

## 2013-07-26 DIAGNOSIS — M129 Arthropathy, unspecified: Secondary | ICD-10-CM | POA: Insufficient documentation

## 2013-07-26 DIAGNOSIS — R531 Weakness: Secondary | ICD-10-CM

## 2013-07-26 DIAGNOSIS — F3289 Other specified depressive episodes: Secondary | ICD-10-CM | POA: Insufficient documentation

## 2013-07-26 DIAGNOSIS — Z8619 Personal history of other infectious and parasitic diseases: Secondary | ICD-10-CM | POA: Insufficient documentation

## 2013-07-26 DIAGNOSIS — Z860100 Personal history of colon polyps, unspecified: Secondary | ICD-10-CM | POA: Insufficient documentation

## 2013-07-26 DIAGNOSIS — I10 Essential (primary) hypertension: Secondary | ICD-10-CM | POA: Insufficient documentation

## 2013-07-26 DIAGNOSIS — E785 Hyperlipidemia, unspecified: Secondary | ICD-10-CM | POA: Insufficient documentation

## 2013-07-26 DIAGNOSIS — Z791 Long term (current) use of non-steroidal anti-inflammatories (NSAID): Secondary | ICD-10-CM | POA: Insufficient documentation

## 2013-07-26 DIAGNOSIS — F329 Major depressive disorder, single episode, unspecified: Secondary | ICD-10-CM | POA: Insufficient documentation

## 2013-07-26 DIAGNOSIS — Z9889 Other specified postprocedural states: Secondary | ICD-10-CM | POA: Insufficient documentation

## 2013-07-26 DIAGNOSIS — E119 Type 2 diabetes mellitus without complications: Secondary | ICD-10-CM | POA: Insufficient documentation

## 2013-07-26 MED ORDER — CARVEDILOL 12.5 MG PO TABS: 12.5000 mg | ORAL_TABLET | Freq: Two times a day (BID) | ORAL | Status: DC

## 2013-07-26 NOTE — ED Provider Notes (Signed)
CSN: [id number]     Arrival date & time 07/26/13  1552 History   First MD Initiated Contact with Patient 07/26/13 1611     Chief Complaint  Patient presents with  . Weakness     (Consider location/radiation/quality/duration/timing/severity/associated sxs/prior Treatment) Patient is a 58 y.o. male presenting with weakness. The history is provided by the patient. No language interpreter was used.  Weakness This is a new problem. The current episode started today. Associated symptoms include weakness. Pertinent negatives include no chills or fever. Associated symptoms comments: The patient experienced an episode of weakness earlier today. The weakness was generalized and brief. No syncope or pre-syncope. No chest pain, SOB, nausea or vomiting. He reports a recent change to his blood pressure medication and when he checked his blood pressure it was found to be 96 systolic. He was instructed to come to the ED by his primary care office. .    Past Medical History  Diagnosis Date  . Arthritis   . Chicken pox   . Depression   . GERD (gastroesophageal reflux disease)   . CAD (coronary artery disease) 6/14    Cardiac catheterization 6/27/ 2014 ejection fraction 35-40%, 30% proximal left circumflex, 100% tiny obtuse marginal 1 with collaterals, 50% LAD, 50% D1, 100% RCA with collaterals  . Hypertension   . Hyperlipidemia   . Kidney stone   . Colon polyps   . Diabetes mellitus     Diet controlled   Past Surgical History  Procedure Laterality Date  . Tonsillectomy  1963   Family History  Problem Relation Age of Onset  . Arthritis Father   . Hyperlipidemia Maternal Grandmother   . Heart disease Maternal Grandmother   . Arthritis Paternal Grandmother    History  Substance Use Topics  . Smoking status: Never Smoker   . Smokeless tobacco: Not on file  . Alcohol Use: No    Review of Systems  Constitutional: Negative for fever and chills.  HENT: Negative.   Respiratory: Negative.    Cardiovascular: Negative.   Gastrointestinal: Negative.   Musculoskeletal: Negative.   Skin: Negative.   Neurological: Positive for weakness.      Allergies  Sulfa antibiotics  Home Medications   Prior to Admission medications   Medication Sig Start Date End Date Taking? Authorizing Provider  ALPRAZolam Duanne Moron) 0.25 MG tablet Take 0.25 mg by mouth at bedtime as needed for sleep.    Historical Provider, MD  aspirin 81 MG tablet Take 81 mg by mouth daily.    Historical Provider, MD  atorvastatin (LIPITOR) 80 MG tablet Take 80 mg by mouth daily.    Historical Provider, MD  benzonatate (TESSALON) 200 MG capsule Take 1 tablet by mouth every 8 hours prn for cough 05/27/13   [name], MD  carvedilol (COREG) 12.5 MG tablet Take 2 tablets at night and one in the morning 05/16/13   [name], MD  celecoxib (CELEBREX) 200 MG capsule Take 200 mg by mouth daily.     Historical Provider, MD  clonazePAM (KLONOPIN) 0.5 MG tablet Once daily as needed for sleep    Historical Provider, MD  desoximetasone (TOPICORT) 0.05 % cream Use bid to affected areas prn but do not use more than 2 weeks continuously. 03/30/13   [name], MD  escitalopram (LEXAPRO) 10 MG tablet Take 10 mg by mouth daily.    Historical Provider, MD  esomeprazole (NEXIUM) 20 MG capsule Take 20 mg by mouth daily before breakfast.  Historical Provider, MD  ezetimibe (ZETIA) 10 MG tablet Take 1 tablet (10 mg total) by mouth daily. 05/04/13   [name], MD  fish oil-omega-3 fatty acids 1000 MG capsule Take 2 g by mouth daily.    Historical Provider, MD  fluticasone (FLONASE) 50 MCG/ACT nasal spray Place 2 sprays into both nostrils daily. 05/04/13   [name], MD  gabapentin (NEURONTIN) 600 MG tablet Take 600 mg by mouth 2 (two) times daily.    Historical Provider, MD  lisinopril (PRINIVIL,ZESTRIL) 5 MG tablet Take 1 tablet (5 mg total) by mouth daily. 05/16/13   [name], MD  LOCOID LIPOCREAM  0.1 % CREA Place on area prn 12/07/12   Historical Provider, MD  NEXIUM 40 MG capsule TAKE 1 CAPSULE (40MG ) BY ORAL ROUTE EVERY DAY 06/09/13   [name], MD  RESTASIS 0.05 % ophthalmic emulsion Place 1 drop into both eyes 2 (two) times daily.  11/20/12   Historical Provider, MD  tamsulosin (FLOMAX) 0.4 MG CAPS capsule TAKE 1 CAPSULE (0.4MG ) BY ORAL ROUTE EVERY DAY 1/2 HOUR FOLLOWING THE SAME MEAL EACH DAY 01/26/13   [name], MD  VOLTAREN 1 % GEL Apply 2 g topically 2 (two) times daily.  10/18/12   Historical Provider, MD  zolpidem (AMBIEN) 10 MG tablet Take 10 mg by mouth at bedtime as needed for sleep.    Historical Provider, MD   BP 143/82  Pulse 67  Temp(Src) 98 F (36.7 C) (Oral)  Resp 20  SpO2 97% Physical Exam  Constitutional: He is oriented to person, place, and time. He appears well-developed and well-nourished.  HENT:  Head: Normocephalic.  Neck: Normal range of motion. Neck supple.  Cardiovascular: Normal rate and regular rhythm.   Pulmonary/Chest: Effort normal and breath sounds normal.  Abdominal: Soft. Bowel sounds are normal. There is no tenderness. There is no rebound and no guarding.  Musculoskeletal: Normal range of motion. He exhibits no edema.  Neurological: He is alert and oriented to person, place, and time. He exhibits normal muscle tone. Coordination normal.  Skin: Skin is warm and dry. No rash noted.  Psychiatric: He has a normal mood and affect.    ED Course  Procedures (including critical care time) Labs Review Labs Reviewed - No data to display  Imaging Review No results found.   EKG Interpretation None      MDM   Final diagnoses:  None    1. Weakness, resolved  He states he is asymptomatic at present. He has spoken with his cardiologist since arrival here and was given verbal instructions to change medications and follow up in his office. The patient reports he feels fine. Blood pressure has normalized here. No chest pain at any  time. Stable for discharge.     Dewaine Oats, PA-C 07/26/13 1622

## 2013-07-26 NOTE — Telephone Encounter (Signed)
Spoke with pt, he is currently in the ER. When he called dr burchette's office they referred him to the er. He is feeling fine now with no chest pain or complaints. He reports he walked about 2 miles yesterday and did not drink enough fluids. He is currently taking 12.5 mg of carvedilol in the am and 25 mg in the pm Pt to take just one tablet of carvedilol tonight and increase his fluids Pt is going to leave the er without being seen Will forward for dr Stanford Breed review

## 2013-07-26 NOTE — Telephone Encounter (Signed)
Spoke with pt, Aware of dr crenshaw's recommendations.  °

## 2013-07-26 NOTE — Telephone Encounter (Signed)
New message     Patient felt dizzy today--96/66 pulse 77 was his bp and pulse at the time.  Now it is 103/64.  Just wanted to let you know. Please advise

## 2013-07-26 NOTE — Telephone Encounter (Signed)
Patient Information:  Caller Name: Shain  Phone: [phone]  Patient: Peter Snyder, Peter Snyder  Gender: Male  DOB: 26-Jun-1955  Age: 58 Years  PCP: Carolann Littler (Family Practice)  Office Follow Up:  Does the office need to follow up with this patient?: No  Instructions For The Office: N/A  RN Note:  While sitting outside, got drowsy then stood up to walk and became dizzy. Pulse remained at 70 bpm. Nasal congestion present.  Felt some palpitations while checking BP that are not present now. History right coronary artery blockage. No appointments remain at [facility] or [facility]  Discussed with symptoms with Saundrea/office nurse who recommended ED now.  Symptoms  Reason For Call & Symptoms: Dizzy, weakness and hypotension with BP 102/60 and 96/60.  Last BP 108/62 at 15:05  Reviewed Health History In EMR: Yes  Reviewed Medications In EMR: Yes  Reviewed Allergies In EMR: Yes  Reviewed Surgeries / Procedures: Yes  Date of Onset of Symptoms: 07/26/2013  Treatments Tried: Increased water intake, crackers with cheese  Treatments Tried Worked: Yes  Guideline(s) Used:  Dizziness  Disposition Per Guideline:   Go to ED Now (or to Office with PCP Approval)  Reason For Disposition Reached:   Extra heart beats OR irregular heart beating (i.e., "palpitations")  Advice Given:  N/A  RN Overrode Recommendation:  Go To ED  [facility]

## 2013-07-26 NOTE — ED Notes (Signed)
Pt became weak with a low BP, pts cardiologist called pt stating he amt just need to change his BP medication. Denies chest pain, SOB, or any sx. States "I feel fine."

## 2013-07-26 NOTE — Telephone Encounter (Signed)
Change coreg to 12.5 BID Lelon Perla

## 2013-07-26 NOTE — Discharge Instructions (Signed)
FOLLOW INSTRUCTIONS BY DR. Stanford Breed AND FOLLOW UP WITH HIM IN HIS OFFICE FOR FURTHER MANAGEMENT. RETURN HERE WITH ANY FURTHER SYMPTOMS OR NEW CONCERNS.

## 2013-07-29 NOTE — ED Provider Notes (Signed)
Medical screening examination/treatment/procedure(s) were performed by non-physician practitioner and as supervising physician I was immediately available for consultation/collaboration.   EKG Interpretation None        Blanchie Dessert, MD 07/29/13 1547

## 2013-09-02 ENCOUNTER — Ambulatory Visit (INDEPENDENT_AMBULATORY_CARE_PROVIDER_SITE_OTHER): Payer: BC Managed Care – PPO | Admitting: Family Medicine

## 2013-09-02 ENCOUNTER — Ambulatory Visit: Payer: Self-pay

## 2013-09-02 ENCOUNTER — Encounter: Payer: Self-pay | Admitting: Family Medicine

## 2013-09-02 VITALS — BP 124/90 | HR 80 | Temp 98.6°F | Ht 74.0 in | Wt 206.0 lb

## 2013-09-02 DIAGNOSIS — G609 Hereditary and idiopathic neuropathy, unspecified: Secondary | ICD-10-CM

## 2013-09-02 DIAGNOSIS — A084 Viral intestinal infection, unspecified: Secondary | ICD-10-CM

## 2013-09-02 DIAGNOSIS — G629 Polyneuropathy, unspecified: Secondary | ICD-10-CM | POA: Insufficient documentation

## 2013-09-02 DIAGNOSIS — A088 Other specified intestinal infections: Secondary | ICD-10-CM

## 2013-09-02 LAB — VITAMIN B12: Vitamin B-12: 392 pg/mL (ref 211–911)

## 2013-09-02 NOTE — Patient Instructions (Addendum)
Viral Gastroenteritis Viral gastroenteritis is also known as stomach flu. This condition affects the stomach and intestinal tract. It can cause sudden diarrhea and vomiting. The illness typically lasts 3 to 8 days. Most people develop an immune response that eventually gets rid of the virus. While this natural response develops, the virus can make you quite ill. CAUSES  Many different viruses can cause gastroenteritis, such as rotavirus or noroviruses. You can catch one of these viruses by consuming contaminated food or water. You may also catch a virus by sharing utensils or other personal items with an infected person or by touching a contaminated surface. SYMPTOMS  The most common symptoms are diarrhea and vomiting. These problems can cause a severe loss of body fluids (dehydration) and a body salt (electrolyte) imbalance. Other symptoms may include:  Fever.  Headache.  Fatigue.  Abdominal pain. DIAGNOSIS  Your caregiver can usually diagnose viral gastroenteritis based on your symptoms and a physical exam. A stool sample may also be taken to test for the presence of viruses or other infections. TREATMENT  This illness typically goes away on its own. Treatments are aimed at rehydration. The most serious cases of viral gastroenteritis involve vomiting so severely that you are not able to keep fluids down. In these cases, fluids must be given through an intravenous line (IV). HOME CARE INSTRUCTIONS   Drink enough fluids to keep your urine clear or pale yellow. Drink small amounts of fluids frequently and increase the amounts as tolerated.  Ask your caregiver for specific rehydration instructions.  Avoid:  Foods high in sugar.  Alcohol.  Carbonated drinks.  Tobacco.  Juice.  Caffeine drinks.  Extremely hot or cold fluids.  Fatty, greasy foods.  Too much intake of anything at one time.  Dairy products until 24 to 48 hours after diarrhea stops.  You may consume probiotics.  Probiotics are active cultures of beneficial bacteria. They may lessen the amount and number of diarrheal stools in adults. Probiotics can be found in yogurt with active cultures and in supplements.  Wash your hands well to avoid spreading the virus.  Only take over-the-counter or prescription medicines for pain, discomfort, or fever as directed by your caregiver. Do not give aspirin to children. Antidiarrheal medicines are not recommended.  Ask your caregiver if you should continue to take your regular prescribed and over-the-counter medicines.  Keep all follow-up appointments as directed by your caregiver. SEEK IMMEDIATE MEDICAL CARE IF:   You are unable to keep fluids down.  You do not urinate at least once every 6 to 8 hours.  You develop shortness of breath.  You notice blood in your stool or vomit. This may look like coffee grounds.  You have abdominal pain that increases or is concentrated in one small area (localized).  You have persistent vomiting or diarrhea.  You have a fever.  The patient is a child younger than 3 months, and he or she has a fever.  The patient is a child older than 3 months, and he or she has a fever and persistent symptoms.  The patient is a child older than 3 months, and he or she has a fever and symptoms suddenly get worse.  The patient is a baby, and he or she has no tears when crying. MAKE SURE YOU:   Understand these instructions.  Will watch your condition.  Will get help right away if you are not doing well or get worse. Document Released: 03/03/2005 Document Revised: 05/26/2011 Document Reviewed: 12/18/2010   ExitCare Patient Information 2015 [place]. This information is not intended to replace advice given to you by your health care provider. Make sure you discuss any questions you have with your health care provider.  May increase Gabapentin up to one three times daily

## 2013-09-02 NOTE — Progress Notes (Signed)
Pre visit review using our clinic review tool, if applicable. No additional management support is needed unless otherwise documented below in the visit note. 

## 2013-09-02 NOTE — Progress Notes (Signed)
   Subjective:    Patient ID: Gloriann Loan, male    DOB: [birth year], 58 y.o.   MRN: [id number]  Abdominal Pain Associated symptoms include diarrhea and nausea. Pertinent negatives include no fever or vomiting.    Patient seen for the following issues  Onset 2 days ago Wednesday of some nausea without vomiting and loose stools. No bloody stools. Increased fatigue and body aches. No fevers or chills. Decreased appetite. Symptoms are slowly improving. Still some loose stools but no diarrhea. Appetite is still poor but no significant nausea. No sick contacts. Has some diffuse abdominal cramping  History of B12 deficiency. He has frequent symptoms of burning stinging sensation in both feet. No history of diabetes. Saw a neurologist in [place] prior to moving here. He does not recall having B12 level or other labs. Recent TSH normal. Takes gabapentin 600 mg twice a day. Still some breakthrough symptoms. He does take chronic PPI with Nexium.  Past Medical History  Diagnosis Date  . Arthritis   . Chicken pox   . Depression   . GERD (gastroesophageal reflux disease)   . CAD (coronary artery disease) 6/14    Cardiac catheterization 6/27/ 2014 ejection fraction 35-40%, 30% proximal left circumflex, 100% tiny obtuse marginal 1 with collaterals, 50% LAD, 50% D1, 100% RCA with collaterals  . Hypertension   . Hyperlipidemia   . Kidney stone   . Colon polyps   . Diabetes mellitus     Diet controlled   Past Surgical History  Procedure Laterality Date  . Tonsillectomy  1963    reports that he has never smoked. He does not have any smokeless tobacco history on file. He reports that he does not drink alcohol or use illicit drugs. family history includes Arthritis in his father and paternal grandmother; Heart disease in his maternal grandmother; Hyperlipidemia in his maternal grandmother. Allergies  Allergen Reactions  . Sulfa Antibiotics      Review of Systems  Constitutional: Positive  for appetite change. Negative for fever, chills and unexpected weight change.  Respiratory: Negative for cough.   Cardiovascular: Negative for chest pain.  Gastrointestinal: Positive for nausea, abdominal pain and diarrhea. Negative for vomiting and blood in stool.  Neurological: Negative for dizziness and syncope.       Objective:   Physical Exam  Constitutional: He appears well-developed and well-nourished.  HENT:  Mouth/Throat: Oropharynx is clear and moist.  Cardiovascular: Normal rate.   Pulmonary/Chest: Effort normal and breath sounds normal. No respiratory distress. He has no wheezes. He has no rales.  Abdominal: Soft. Bowel sounds are normal. He exhibits no distension and no mass. There is no tenderness. There is no rebound and no guarding.  Musculoskeletal: He exhibits no edema.  Distal foot pulses are palpable bilaterally.          Assessment & Plan:  #1 viral gastroenteritis. Symptoms are slowly improving. Bland diet until symptoms have resolved. #2 history of peripheral neuropathy. Check B12 level, especially on chronic proton pump inhibitor. Consider increase gabapentin to 600 mg 3 times a day

## 2013-09-08 ENCOUNTER — Ambulatory Visit (INDEPENDENT_AMBULATORY_CARE_PROVIDER_SITE_OTHER): Payer: BC Managed Care – PPO | Admitting: Family Medicine

## 2013-09-08 ENCOUNTER — Encounter: Payer: Self-pay | Admitting: Family Medicine

## 2013-09-08 VITALS — BP 120/80 | HR 84 | Wt 206.0 lb

## 2013-09-08 DIAGNOSIS — G609 Hereditary and idiopathic neuropathy, unspecified: Secondary | ICD-10-CM

## 2013-09-08 DIAGNOSIS — G629 Polyneuropathy, unspecified: Secondary | ICD-10-CM

## 2013-09-08 DIAGNOSIS — R21 Rash and other nonspecific skin eruption: Secondary | ICD-10-CM

## 2013-09-08 MED ORDER — GABAPENTIN 600 MG PO TABS: 600.0000 mg | ORAL_TABLET | Freq: Three times a day (TID) | ORAL | Status: DC

## 2013-09-08 MED ORDER — METHYLPREDNISOLONE ACETATE 80 MG/ML IJ SUSP
80.0000 mg | Freq: Once | INTRAMUSCULAR | Status: AC
  Administered 2013-09-08: 80 mg via INTRAMUSCULAR

## 2013-09-08 NOTE — Progress Notes (Signed)
Pre visit review using our clinic review tool, if applicable. No additional management support is needed unless otherwise documented below in the visit note. 

## 2013-09-08 NOTE — Progress Notes (Signed)
   Subjective:    Patient ID: Peter Snyder, male    DOB: February 02, 1956, 58 y.o.   MRN: [id number]  Neurologic Problem The patient's pertinent negatives include no weakness. Pertinent negatives include no chest pain, fever or shortness of breath.   Here for the following issues  Recurrent rash neck bilaterally. She went to a dermatologist yesterday and prescribed Locoid cream. He had seen dermatologist in [place] years ago and was told he had some sun damaged skin issues and periodically received Depo-Medrol injection which helped. He has itching. No jewelry wear.exacerbated by heat.  History of bilateral peripheral neuropathy. No history of diabetes. Previously worked up by neurologist. He was having significant pain and we obtained B12 level which was normal. Recent TSH normal.  No diabetes.  Increased gabapentin to 600 mg 3 times a day and his pain is improving. Currently 3/10 pain.  Past Medical History  Diagnosis Date  . Arthritis   . Chicken pox   . Depression   . GERD (gastroesophageal reflux disease)   . CAD (coronary artery disease) 6/14    Cardiac catheterization 6/27/ 2014 ejection fraction 35-40%, 30% proximal left circumflex, 100% tiny obtuse marginal 1 with collaterals, 50% LAD, 50% D1, 100% RCA with collaterals  . Hypertension   . Hyperlipidemia   . Kidney stone   . Colon polyps   . Diabetes mellitus     Diet controlled   Past Surgical History  Procedure Laterality Date  . Tonsillectomy  1963    reports that he has never smoked. He does not have any smokeless tobacco history on file. He reports that he does not drink alcohol or use illicit drugs. family history includes Arthritis in his father and paternal grandmother; Heart disease in his maternal grandmother; Hyperlipidemia in his maternal grandmother. Allergies  Allergen Reactions  . Sulfa Antibiotics       Review of Systems  Constitutional: Negative for fever and chills.  Respiratory: Negative for cough and  shortness of breath.   Cardiovascular: Negative for chest pain.  Skin: Positive for rash.  Neurological: Negative for weakness.       Objective:   Physical Exam  Constitutional: He appears well-developed and well-nourished.  Cardiovascular: Normal rate and regular rhythm.   Pulmonary/Chest: Effort normal and breath sounds normal.  Skin:  Anterior neck reveals some nonspecific erythema and a couple excoriations. He has prominent telangiectasias which are chronic. No vesicles. No pustules.          Assessment & Plan:  #1 chronic bilateral idiopathic neuropathy. Symptoms improved with increase gabapentin. New prescriptions sent. Recent B12 level normal.  No diabetes history.  Previous w/u per neurology. #2 nonspecific rash anterior neck. Depo-Medrol 80 mg IM which he has taken sporadically in the past with good relief. We explained these would not be recommended frequently because of adverse issues with steroid

## 2013-09-08 NOTE — Patient Instructions (Signed)

## 2013-09-14 ENCOUNTER — Encounter: Payer: Self-pay | Admitting: Family Medicine

## 2013-09-14 ENCOUNTER — Ambulatory Visit (INDEPENDENT_AMBULATORY_CARE_PROVIDER_SITE_OTHER): Payer: BC Managed Care – PPO | Admitting: Family Medicine

## 2013-09-14 VITALS — BP 118/80 | HR 84 | Temp 98.3°F | Wt 206.0 lb

## 2013-09-14 DIAGNOSIS — J018 Other acute sinusitis: Secondary | ICD-10-CM

## 2013-09-14 MED ORDER — AMOXICILLIN-POT CLAVULANATE 875-125 MG PO TABS: 1.0000 | ORAL_TABLET | Freq: Two times a day (BID) | ORAL | Status: DC

## 2013-09-14 MED ORDER — BENZONATATE 200 MG PO CAPS: ORAL_CAPSULE | ORAL | Status: DC

## 2013-09-14 NOTE — Progress Notes (Signed)
   Subjective:    Patient ID: Peter Snyder, male    DOB: 11/25/1955, 58 y.o.   MRN: [id number]  Sinusitis Associated symptoms include congestion, coughing, headaches and sinus pressure. Pertinent negatives include no chills, ear pain or sore throat.   Acute visit. One-week history of sinus congestive symptoms. He had some intermittent frontal headaches and sinus pressure ethmoid or frontal region. Yellowish nasal discharge. Increased malaise. Cough is mostly nonproductive. No fever or chills. Frequent sinusitis symptoms the past. He is concerned because son is getting married this weekend in [place].  Past Medical History  Diagnosis Date  . Arthritis   . Chicken pox   . Depression   . GERD (gastroesophageal reflux disease)   . CAD (coronary artery disease) 6/14    Cardiac catheterization 6/27/ 2014 ejection fraction 35-40%, 30% proximal left circumflex, 100% tiny obtuse marginal 1 with collaterals, 50% LAD, 50% D1, 100% RCA with collaterals  . Hypertension   . Hyperlipidemia   . Kidney stone   . Colon polyps   . Diabetes mellitus     Diet controlled   Past Surgical History  Procedure Laterality Date  . Tonsillectomy  1963    reports that he has never smoked. He does not have any smokeless tobacco history on file. He reports that he does not drink alcohol or use illicit drugs. family history includes Arthritis in his father and paternal grandmother; Heart disease in his maternal grandmother; Hyperlipidemia in his maternal grandmother. Allergies  Allergen Reactions  . Sulfa Antibiotics       Review of Systems  Constitutional: Positive for fatigue. Negative for fever and chills.  HENT: Positive for congestion and sinus pressure. Negative for ear pain and sore throat.   Respiratory: Positive for cough.   Neurological: Positive for headaches.       Objective:   Physical Exam  Constitutional: He appears well-developed and well-nourished.  HENT:  Right Ear: External  ear normal.  Left Ear: External ear normal.  Mouth/Throat: Oropharynx is clear and moist.  Neck: Neck supple.  Cardiovascular: Normal rate and regular rhythm.   Pulmonary/Chest: Effort normal and breath sounds normal. No respiratory distress. He has no wheezes. He has no rales.  Lymphadenopathy:    He has no cervical adenopathy.          Assessment & Plan:  Acute sinusitis. He is aware that these are frequently viral. If symptoms persist start Augmentin 875 mg twice daily for 10 days

## 2013-09-14 NOTE — Patient Instructions (Signed)
Sinusitis Sinusitis is redness, soreness, and swelling (inflammation) of the paranasal sinuses. Paranasal sinuses are air pockets within the bones of your face (beneath the eyes, the middle of the forehead, or above the eyes). In healthy paranasal sinuses, mucus is able to drain out, and air is able to circulate through them by way of your nose. However, when your paranasal sinuses are inflamed, mucus and air can become trapped. This can allow bacteria and other germs to grow and cause infection. Sinusitis can develop quickly and last only a short time (acute) or continue over a long period (chronic). Sinusitis that lasts for more than 12 weeks is considered chronic.  CAUSES  Causes of sinusitis include:  Allergies.  Structural abnormalities, such as displacement of the cartilage that separates your nostrils (deviated septum), which can decrease the air flow through your nose and sinuses and affect sinus drainage.  Functional abnormalities, such as when the small hairs (cilia) that line your sinuses and help remove mucus do not work properly or are not present. SYMPTOMS  Symptoms of acute and chronic sinusitis are the same. The primary symptoms are pain and pressure around the affected sinuses. Other symptoms include:  Upper toothache.  Earache.  Headache.  Bad breath.  Decreased sense of smell and taste.  A cough, which worsens when you are lying flat.  Fatigue.  Fever.  Thick drainage from your nose, which often is green and may contain pus (purulent).  Swelling and warmth over the affected sinuses. DIAGNOSIS  Your caregiver will perform a physical exam. During the exam, your caregiver may:  Look in your nose for signs of abnormal growths in your nostrils (nasal polyps).  Tap over the affected sinus to check for signs of infection.  View the inside of your sinuses (endoscopy) with a special imaging device with a light attached (endoscope), which is inserted into your  sinuses. If your caregiver suspects that you have chronic sinusitis, one or more of the following tests may be recommended:  Allergy tests.  Nasal culture--A sample of mucus is taken from your nose and sent to a lab and screened for bacteria.  Nasal cytology--A sample of mucus is taken from your nose and examined by your caregiver to determine if your sinusitis is related to an allergy. TREATMENT  Most cases of acute sinusitis are related to a viral infection and will resolve on their own within 10 days. Sometimes medicines are prescribed to help relieve symptoms (pain medicine, decongestants, nasal steroid sprays, or saline sprays).  However, for sinusitis related to a bacterial infection, your caregiver will prescribe antibiotic medicines. These are medicines that will help kill the bacteria causing the infection.  Rarely, sinusitis is caused by a fungal infection. In theses cases, your caregiver will prescribe antifungal medicine. For some cases of chronic sinusitis, surgery is needed. Generally, these are cases in which sinusitis recurs more than 3 times per year, despite other treatments. HOME CARE INSTRUCTIONS   Drink plenty of water. Water helps thin the mucus so your sinuses can drain more easily.  Use a humidifier.  Inhale steam 3 to 4 times a day (for example, sit in the bathroom with the shower running).  Apply a warm, moist washcloth to your face 3 to 4 times a day, or as directed by your caregiver.  Use saline nasal sprays to help moisten and clean your sinuses.  Take over-the-counter or prescription medicines for pain, discomfort, or fever only as directed by your caregiver. SEEK IMMEDIATE MEDICAL CARE IF:    You have increasing pain or severe headaches.  You have nausea, vomiting, or drowsiness.  You have swelling around your face.  You have vision problems.  You have a stiff neck.  You have difficulty breathing. MAKE SURE YOU:   Understand these  instructions.  Will watch your condition.  Will get help right away if you are not doing well or get worse. Document Released: 03/03/2005 Document Revised: 05/26/2011 Document Reviewed: 03/18/2011 Exit[employer] Patient Information 2015 [employer]. This information is not intended to replace advice given to you by your health care provider. Make sure you discuss any questions you have with your health care provider.  

## 2013-09-14 NOTE — Progress Notes (Signed)
Pre visit review using our clinic review tool, if applicable. No additional management support is needed unless otherwise documented below in the visit note. 

## 2013-09-27 ENCOUNTER — Encounter: Payer: Self-pay | Admitting: Cardiology

## 2013-09-27 ENCOUNTER — Ambulatory Visit (INDEPENDENT_AMBULATORY_CARE_PROVIDER_SITE_OTHER): Payer: BC Managed Care – PPO | Admitting: Cardiology

## 2013-09-27 VITALS — BP 128/86 | HR 59 | Ht 74.0 in | Wt 205.4 lb

## 2013-09-27 DIAGNOSIS — I2510 Atherosclerotic heart disease of native coronary artery without angina pectoris: Secondary | ICD-10-CM

## 2013-09-27 DIAGNOSIS — E785 Hyperlipidemia, unspecified: Secondary | ICD-10-CM

## 2013-09-27 DIAGNOSIS — I2589 Other forms of chronic ischemic heart disease: Secondary | ICD-10-CM

## 2013-09-27 DIAGNOSIS — I2584 Coronary atherosclerosis due to calcified coronary lesion: Secondary | ICD-10-CM

## 2013-09-27 DIAGNOSIS — I10 Essential (primary) hypertension: Secondary | ICD-10-CM

## 2013-09-27 DIAGNOSIS — I255 Ischemic cardiomyopathy: Secondary | ICD-10-CM

## 2013-09-27 NOTE — Assessment & Plan Note (Signed)
Continue statin. 

## 2013-09-27 NOTE — Assessment & Plan Note (Signed)
Continue aspirin and statin. 

## 2013-09-27 NOTE — Progress Notes (Signed)
HPI: FU coronary artery disease. Previous cardiac care in [place]. Echocardiogram in June 2014 showed an ejection fraction of 55%. Nuclear study in June of 2014 showed an ejection fraction of 43%, inferior and anterior defect and left ventricular dilatation with exercise. Cardiac catheterization performed in June of 2014 showed an ejection fraction of 35-40%. The inferior wall is akinetic. The left main was normal. There was a 30% proximal circumflex, occluded small first obtuse marginal with collaterals, a 50% ramus intermedius, 50% LAD, 50% first diagonal and an occluded right coronary artery with collaterals. Treated medically. Echocardiogram repeated in January of 2015. Ejection fraction 45-50%. Grade 1 diastolic dysfunction. Mild left atrial enlargement and mild mitral regurgitation. Siince last seen, the patient denies any dyspnea on exertion, orthopnea, PND, pedal edema, palpitations, syncope or chest pain.   Current Outpatient Prescriptions  Medication Sig Dispense Refill  . ALPRAZolam (XANAX) 0.25 MG tablet Take 0.25 mg by mouth at bedtime as needed for sleep.      Marland Kitchen aspirin 81 MG tablet Take 81 mg by mouth daily.      Marland Kitchen atorvastatin (LIPITOR) 80 MG tablet Take 80 mg by mouth daily.      . carvedilol (COREG) 12.5 MG tablet Take 1 tablet (12.5 mg total) by mouth 2 (two) times daily with a meal.  270 tablet  2  . celecoxib (CELEBREX) 200 MG capsule Take 200 mg by mouth daily.       . clonazePAM (KLONOPIN) 0.5 MG tablet Once daily as needed for sleep      . desoximetasone (TOPICORT) 0.05 % cream Use bid to affected areas prn but do not use more than 2 weeks continuously.  60 g  2  . escitalopram (LEXAPRO) 10 MG tablet Take 10 mg by mouth daily.      Marland Kitchen ezetimibe (ZETIA) 10 MG tablet Take 1 tablet (10 mg total) by mouth daily.  30 tablet  11  . fish oil-omega-3 fatty acids 1000 MG capsule Take 2 g by mouth daily.      . fluticasone (FLONASE) 50 MCG/ACT nasal spray Place 2 sprays into  both nostrils daily.  16 g  6  . gabapentin (NEURONTIN) 600 MG tablet Take 1 tablet (600 mg total) by mouth 3 (three) times daily.  270 tablet  3  . lisinopril (PRINIVIL,ZESTRIL) 5 MG tablet Take 1 tablet (5 mg total) by mouth daily.  90 tablet  3  . LOCOID LIPOCREAM 0.1 % CREA Place on area prn      . NEXIUM 40 MG capsule TAKE 1 CAPSULE (40MG ) BY ORAL ROUTE EVERY DAY  90 capsule  1  . RESTASIS 0.05 % ophthalmic emulsion Place 1 drop into both eyes 2 (two) times daily.       . tamsulosin (FLOMAX) 0.4 MG CAPS capsule TAKE 1 CAPSULE (0.4MG ) BY ORAL ROUTE EVERY DAY 1/2 HOUR FOLLOWING THE SAME MEAL EACH DAY  30 capsule  11  . VOLTAREN 1 % GEL Apply 2 g topically 2 (two) times daily.       Marland Kitchen zolpidem (AMBIEN) 10 MG tablet Take 10 mg by mouth at bedtime as needed for sleep.       No current facility-administered medications for this visit.     Past Medical History  Diagnosis Date  . Arthritis   . Chicken pox   . Depression   . GERD (gastroesophageal reflux disease)   . CAD (coronary artery disease) 6/14    Cardiac catheterization 6/27/ 2014 ejection  fraction 35-40%, 30% proximal left circumflex, 100% tiny obtuse marginal 1 with collaterals, 50% LAD, 50% D1, 100% RCA with collaterals  . Hypertension   . Hyperlipidemia   . Kidney stone   . Colon polyps   . Diabetes mellitus     Diet controlled    Past Surgical History  Procedure Laterality Date  . Tonsillectomy  1963    History   Social History  . Marital Status: Married    Spouse Name: N/A    Number of Children: 3  . Years of Education: N/A   Occupational History  .      Retired   Social History Main Topics  . Smoking status: Never Smoker   . Smokeless tobacco: Not on file  . Alcohol Use: No  . Drug Use: No  . Sexual Activity: Not on file   Other Topics Concern  . Not on file   Social History Narrative  . No narrative on file    ROS: no fevers or chills, productive cough, hemoptysis, dysphasia, odynophagia,  melena, hematochezia, dysuria, hematuria, rash, seizure activity, orthopnea, PND, pedal edema, claudication. Remaining systems are negative.  Physical Exam: Well-developed well-nourished in no acute distress.  Skin is warm and dry.  HEENT is normal.  Neck is supple.  Chest is clear to auscultation with normal expansion.  Cardiovascular exam is regular rate and rhythm.  Abdominal exam nontender or distended. No masses palpated. Extremities show no edema. neuro grossly intact  ECG Sinus rhythm at a rate of 59. Prior inferior infarct. RV conduction delay.

## 2013-09-27 NOTE — Assessment & Plan Note (Signed)
Blood pressure controlled. Continue present medications. 

## 2013-09-27 NOTE — Patient Instructions (Signed)
Your physician wants you to follow-up in: 6 MONTHS WITH DR CRENSHAW You will receive a reminder letter in the mail two months in advance. If you don't receive a letter, please call our office to schedule the follow-up appointment.  

## 2013-09-27 NOTE — Assessment & Plan Note (Signed)
Continue ACE inhibitor and beta blocker. 

## 2013-10-11 ENCOUNTER — Ambulatory Visit (INDEPENDENT_AMBULATORY_CARE_PROVIDER_SITE_OTHER): Payer: BC Managed Care – PPO | Admitting: Family Medicine

## 2013-10-11 ENCOUNTER — Encounter: Payer: Self-pay | Admitting: Family Medicine

## 2013-10-11 VITALS — BP 130/80 | HR 71 | Temp 98.0°F | Wt 208.0 lb

## 2013-10-11 DIAGNOSIS — J0100 Acute maxillary sinusitis, unspecified: Secondary | ICD-10-CM

## 2013-10-11 DIAGNOSIS — J0101 Acute recurrent maxillary sinusitis: Secondary | ICD-10-CM

## 2013-10-11 MED ORDER — AMOXICILLIN-POT CLAVULANATE 875-125 MG PO TABS
1.0000 | ORAL_TABLET | Freq: Two times a day (BID) | ORAL | Status: DC
Start: 2013-10-11 — End: 2013-10-26

## 2013-10-11 NOTE — Patient Instructions (Signed)
Consider adding Zantac, Tagamet, or Pepcid for breakthrough GERD symptoms Consider elevate head of bed 6-8 inches.

## 2013-10-11 NOTE — Progress Notes (Signed)
   Subjective:    Patient ID: Peter Snyder, male    DOB: 10/12/1955, 58 y.o.   MRN: [id number]  HPI  Acute visit. Patient seen with recurrent sinus symptoms. Had frequent sinusitis in the past. He just completed course of Augmentin in early July. He did seem somewhat better but over the past week has had recurrence of facial pain, headaches, colored nasal discharge, cough. No fevers or chills. He uses Flonase regularly for allergy symptoms.  Does have some breakthrough GERD symptoms even with Nexium.  Past Medical History  Diagnosis Date  . Arthritis   . Chicken pox   . Depression   . GERD (gastroesophageal reflux disease)   . CAD (coronary artery disease) 6/14    Cardiac catheterization 6/27/ 2014 ejection fraction 35-40%, 30% proximal left circumflex, 100% tiny obtuse marginal 1 with collaterals, 50% LAD, 50% D1, 100% RCA with collaterals  . Hypertension   . Hyperlipidemia   . Kidney stone   . Colon polyps   . Diabetes mellitus     Diet controlled   Past Surgical History  Procedure Laterality Date  . Tonsillectomy  1963    reports that he has never smoked. He does not have any smokeless tobacco history on file. He reports that he does not drink alcohol or use illicit drugs. family history includes Arthritis in his father and paternal grandmother; Heart disease in his maternal grandmother; Hyperlipidemia in his maternal grandmother. Allergies  Allergen Reactions  . Sulfa Antibiotics       Review of Systems  Constitutional: Positive for fatigue. Negative for fever and chills.  HENT: Positive for congestion and sinus pressure. Negative for facial swelling and sore throat.   Respiratory: Positive for cough.   Neurological: Positive for headaches.       Objective:   Physical Exam  Constitutional: He appears well-developed and well-nourished.  HENT:  Right Ear: External ear normal.  Left Ear: External ear normal.  Mouth/Throat: Oropharynx is clear and moist.    Erythematous nasal mucosa.  Neck: Neck supple.  Cardiovascular: Normal rate and regular rhythm.   Pulmonary/Chest: Effort normal and breath sounds normal. No respiratory distress. He has no wheezes. He has no rales.  Lymphadenopathy:    He has no cervical adenopathy.          Assessment & Plan:  Recurrent versus chronic sinusitis. Refill Augmentin. Elevate head of bed 6-8 inches. Consider supplement Nexium with Pepcid or Zantac over-the-counter. Consider limited CT sinuses if not resolving

## 2013-10-26 ENCOUNTER — Ambulatory Visit (INDEPENDENT_AMBULATORY_CARE_PROVIDER_SITE_OTHER): Payer: BC Managed Care – PPO | Admitting: Family Medicine

## 2013-10-26 ENCOUNTER — Encounter: Payer: Self-pay | Admitting: Family Medicine

## 2013-10-26 VITALS — BP 130/78 | HR 73 | Temp 98.0°F | Wt 211.0 lb

## 2013-10-26 DIAGNOSIS — J3089 Other allergic rhinitis: Secondary | ICD-10-CM

## 2013-10-26 MED ORDER — AZELASTINE HCL 0.1 % NA SOLN: 2.0000 | Freq: Two times a day (BID) | NASAL | Status: DC

## 2013-10-26 NOTE — Progress Notes (Signed)
   Subjective:    Patient ID: Peter Snyder, male    DOB: 1955-09-26, 58 y.o.   MRN: [id number]  Sinusitis Associated symptoms include congestion. Pertinent negatives include no chills, coughing, ear pain, headaches or sore throat.    Persistent sinus symptoms. Patient seen with nasal congestion. He takes Flonase already. He's had presumed sinusitis recurrent over the past year. CT sinuses back in February 2014 (prior to moving here) showed some right maxillary thickening. He recently had treatment with Augmentin and did see some improvement in his color nasal discharge and facial pain.  At this point he has no headaches or facial pain. No fever. Occasional cough. Frequent sneezing.  Past Medical History  Diagnosis Date  . Arthritis   . Chicken pox   . Depression   . GERD (gastroesophageal reflux disease)   . CAD (coronary artery disease) 6/14    Cardiac catheterization 6/27/ 2014 ejection fraction 35-40%, 30% proximal left circumflex, 100% tiny obtuse marginal 1 with collaterals, 50% LAD, 50% D1, 100% RCA with collaterals  . Hypertension   . Hyperlipidemia   . Kidney stone   . Colon polyps   . Diabetes mellitus     Diet controlled   Past Surgical History  Procedure Laterality Date  . Tonsillectomy  1963    reports that he has never smoked. He does not have any smokeless tobacco history on file. He reports that he does not drink alcohol or use illicit drugs. family history includes Arthritis in his father and paternal grandmother; Heart disease in his maternal grandmother; Hyperlipidemia in his maternal grandmother. Allergies  Allergen Reactions  . Sulfa Antibiotics      Review of Systems  Constitutional: Negative for fever and chills.  HENT: Positive for congestion, postnasal drip and rhinorrhea. Negative for ear pain and sore throat.   Respiratory: Negative for cough.   Neurological: Negative for headaches.       Objective:   Physical Exam  Constitutional: He appears  well-developed and well-nourished.  HENT:  Right Ear: External ear normal.  Left Ear: External ear normal.  Nasal mucosa is erythematous. No polyps.  Neck: Neck supple.  Cardiovascular: Normal rate and regular rhythm.   Pulmonary/Chest: Effort normal and breath sounds normal. No respiratory distress. He has no wheezes. He has no rales.  Lymphadenopathy:    He has no cervical adenopathy.          Assessment & Plan:  Rhinitis. Suspect allergic component. He does any headache or facial pain or other predictors of acute sinusitis at this time. Try Astelin nasal 2 sprays per nostril once daily. Continue Flonase.

## 2013-10-26 NOTE — Patient Instructions (Signed)

## 2013-10-26 NOTE — Progress Notes (Signed)
Pre visit review using our clinic review tool, if applicable. No additional management support is needed unless otherwise documented below in the visit note. 

## 2013-10-31 ENCOUNTER — Encounter: Payer: Self-pay | Admitting: Family Medicine

## 2013-10-31 ENCOUNTER — Ambulatory Visit (INDEPENDENT_AMBULATORY_CARE_PROVIDER_SITE_OTHER): Payer: BC Managed Care – PPO | Admitting: Family Medicine

## 2013-10-31 VITALS — BP 128/70 | HR 76 | Temp 98.1°F | Wt 211.0 lb

## 2013-10-31 DIAGNOSIS — J329 Chronic sinusitis, unspecified: Secondary | ICD-10-CM

## 2013-10-31 MED ORDER — LEVOFLOXACIN 500 MG PO TABS: 500.0000 mg | ORAL_TABLET | Freq: Every day | ORAL | Status: DC

## 2013-10-31 NOTE — Progress Notes (Signed)
   Subjective:    Patient ID: Peter Snyder, male    DOB: [birth year], 58 y.o.   MRN: [id number]  Sinusitis Associated symptoms include congestion, coughing and headaches. Pertinent negatives include no chills, ear pain or sore throat.   Patient seen with persistent sinus pressure and intermittent headaches. He has developed some dry cough since last visit. Occasional yellowish nasal discharge. Bilateral maxillary sinus pressure. History of frequent sinusitis in the past. He took Augmentin about 3 weeks ago and saw slight improvement but not resolution. He states previously required course of Levaquin to clear up his symptoms. He has had previous CT of sinuses at previous practice but none in several months.  Past Medical History  Diagnosis Date  . Arthritis   . Chicken pox   . Depression   . GERD (gastroesophageal reflux disease)   . CAD (coronary artery disease) 6/14    Cardiac catheterization 6/27/ 2014 ejection fraction 35-40%, 30% proximal left circumflex, 100% tiny obtuse marginal 1 with collaterals, 50% LAD, 50% D1, 100% RCA with collaterals  . Hypertension   . Hyperlipidemia   . Kidney stone   . Colon polyps   . Diabetes mellitus     Diet controlled   Past Surgical History  Procedure Laterality Date  . Tonsillectomy  1963    reports that he has never smoked. He does not have any smokeless tobacco history on file. He reports that he does not drink alcohol or use illicit drugs. family history includes Arthritis in his father and paternal grandmother; Heart disease in his maternal grandmother; Hyperlipidemia in his maternal grandmother. Allergies  Allergen Reactions  . Sulfa Antibiotics       Review of Systems  Constitutional: Negative for fever and chills.  HENT: Positive for congestion. Negative for ear pain and sore throat.   Respiratory: Positive for cough.   Neurological: Positive for headaches.       Objective:   Physical Exam  Constitutional: He appears  well-developed and well-nourished. No distress.  HENT:  Right Ear: External ear normal.  Left Ear: External ear normal.  Mouth/Throat: Oropharynx is clear and moist.  Neck: Neck supple. No thyromegaly present.  Cardiovascular: Normal rate and regular rhythm.   Pulmonary/Chest: Effort normal and breath sounds normal. No respiratory distress. He has no wheezes. He has no rales.  Lymphadenopathy:    He has no cervical adenopathy.          Assessment & Plan:  Chronic versus recurrent sinusitis. Levaquin 500 mg once daily for 10 days. Consider CT of sinuses if no improvement after that.

## 2013-10-31 NOTE — Patient Instructions (Signed)
Let me know if symptoms not improved after antibiotics.

## 2013-10-31 NOTE — Progress Notes (Signed)
Pre visit review using our clinic review tool, if applicable. No additional management support is needed unless otherwise documented below in the visit note. 

## 2013-11-10 ENCOUNTER — Other Ambulatory Visit: Payer: Self-pay | Admitting: Family Medicine

## 2013-11-25 ENCOUNTER — Telehealth: Payer: Self-pay | Admitting: Family Medicine

## 2013-11-25 MED ORDER — FLUTICASONE PROPIONATE 50 MCG/ACT NA SUSP: 2.0000 | Freq: Every day | NASAL | Status: DC

## 2013-11-25 NOTE — Telephone Encounter (Signed)
[id number] - [place], [place] [address] is requesting re-fill on fluticasone (FLONASE) 50 MCG/ACT nasal spray

## 2013-11-25 NOTE — Telephone Encounter (Signed)
Rx sent to pharmacy   

## 2013-11-29 ENCOUNTER — Telehealth: Payer: Self-pay | Admitting: Cardiology

## 2013-11-29 NOTE — Telephone Encounter (Signed)
Spoke with pt, reassurance given to patient. He is due to follow up with dr Stanford Breed in jan 2016 and any testing can be done prior to his trip if needed

## 2013-11-29 NOTE — Telephone Encounter (Signed)
Mr.Mackowski is calling because he wants to know your thoughts on taking a trip [place] next [place] .Marland Kitchen Please calll

## 2013-12-02 ENCOUNTER — Ambulatory Visit (INDEPENDENT_AMBULATORY_CARE_PROVIDER_SITE_OTHER): Payer: BC Managed Care – PPO | Admitting: Family Medicine

## 2013-12-02 ENCOUNTER — Encounter: Payer: Self-pay | Admitting: Family Medicine

## 2013-12-02 VITALS — BP 120/70 | HR 67 | Temp 98.5°F | Wt 207.0 lb

## 2013-12-02 DIAGNOSIS — R059 Cough, unspecified: Secondary | ICD-10-CM

## 2013-12-02 DIAGNOSIS — R05 Cough: Secondary | ICD-10-CM

## 2013-12-02 DIAGNOSIS — R233 Spontaneous ecchymoses: Secondary | ICD-10-CM

## 2013-12-02 DIAGNOSIS — L723 Sebaceous cyst: Secondary | ICD-10-CM

## 2013-12-02 DIAGNOSIS — J310 Chronic rhinitis: Secondary | ICD-10-CM

## 2013-12-02 DIAGNOSIS — L720 Epidermal cyst: Secondary | ICD-10-CM

## 2013-12-02 DIAGNOSIS — R058 Other specified cough: Secondary | ICD-10-CM

## 2013-12-02 DIAGNOSIS — Z23 Encounter for immunization: Secondary | ICD-10-CM

## 2013-12-02 MED ORDER — PREDNISONE 10 MG PO TABS: ORAL_TABLET | ORAL | Status: DC

## 2013-12-02 MED ORDER — BENZONATATE 200 MG PO CAPS: ORAL_CAPSULE | ORAL | Status: DC

## 2013-12-02 NOTE — Progress Notes (Signed)
Pre visit review using our clinic review tool, if applicable. No additional management support is needed unless otherwise documented below in the visit note. 

## 2013-12-02 NOTE — Patient Instructions (Signed)
Epidermal Cyst An epidermal cyst is sometimes called a sebaceous cyst, epidermal inclusion cyst, or infundibular cyst. These cysts usually contain a substance that looks "pasty" or "cheesy" and may have a bad smell. This substance is a protein called keratin. Epidermal cysts are usually found on the face, neck, or trunk. They may also occur in the vaginal area or other parts of the genitalia of both men and women. Epidermal cysts are usually small, painless, slow-growing bumps or lumps that move freely under the skin. It is important not to try to pop them. This may cause an infection and lead to tenderness and swelling. CAUSES  Epidermal cysts may be caused by a deep penetrating injury to the skin or a plugged hair follicle, often associated with acne. SYMPTOMS  Epidermal cysts can become inflamed and cause:  Redness.  Tenderness.  Increased temperature of the skin over the bumps or lumps.  Grayish-white, bad smelling material that drains from the bump or lump. DIAGNOSIS  Epidermal cysts are easily diagnosed by your caregiver during an exam. Rarely, a tissue sample (biopsy) may be taken to rule out other conditions that may resemble epidermal cysts. TREATMENT   Epidermal cysts often get better and disappear on their own. They are rarely ever cancerous.  If a cyst becomes infected, it may become inflamed and tender. This may require opening and draining the cyst. Treatment with antibiotics may be necessary. When the infection is gone, the cyst may be removed with minor surgery.  Small, inflamed cysts can often be treated with antibiotics or by injecting steroid medicines.  Sometimes, epidermal cysts become large and bothersome. If this happens, surgical removal in your caregiver's office may be necessary. HOME CARE INSTRUCTIONS  Only take over-the-counter or prescription medicines as directed by your caregiver.  Take your antibiotics as directed. Finish them even if you start to feel  better. SEEK MEDICAL CARE IF:   Your cyst becomes tender, red, or swollen.  Your condition is not improving or is getting worse.  You have any other questions or concerns. MAKE SURE YOU:  Understand these instructions.  Will watch your condition.  Will get help right away if you are not doing well or get worse. Document Released: 02/02/2004 Document Revised: 05/26/2011 Document Reviewed: 09/09/2010 [employer] Patient Information 2015 [employer]. This information is not intended to replace advice given to you by your health care provider. Make sure you discuss any questions you have with your health care provider.  

## 2013-12-02 NOTE — Progress Notes (Signed)
Subjective:    Patient ID: Peter Snyder, male    DOB: 1955-06-07, 58 y.o.   MRN: [id number]  Sinus Problem Associated symptoms include congestion, coughing and sinus pressure. Pertinent negatives include no chills, ear pain or shortness of breath.   Patient seen with sinusitis symptoms off and on. He has been on multiple course of recent antibiotics. He had nasal stuffiness but has not had any colored nasal discharge or any nosebleeds or any fevers or chills. History of Levaquin last time here and did improve following that. He is mostly complaining of nasal stuffiness at this time. He started back Flonase recently which has helped somewhat. He has occasional dry cough and requesting refills of Tessalon.  New problem of skin lesions penile shaft. No history of HPV. No dysuria.  Recent bruise right calf. No history of trauma. Takes aspirin. Nonpainful to ambulate.  Past Medical History  Diagnosis Date  . Arthritis   . Chicken pox   . Depression   . GERD (gastroesophageal reflux disease)   . CAD (coronary artery disease) 6/14    Cardiac catheterization 6/27/ 2014 ejection fraction 35-40%, 30% proximal left circumflex, 100% tiny obtuse marginal 1 with collaterals, 50% LAD, 50% D1, 100% RCA with collaterals  . Hypertension   . Hyperlipidemia   . Kidney stone   . Colon polyps   . Diabetes mellitus     Diet controlled   Past Surgical History  Procedure Laterality Date  . Tonsillectomy  1963    reports that he has never smoked. He does not have any smokeless tobacco history on file. He reports that he does not drink alcohol or use illicit drugs. family history includes Arthritis in his father and paternal grandmother; Heart disease in his maternal grandmother; Hyperlipidemia in his maternal grandmother. Allergies  Allergen Reactions  . Sulfa Antibiotics       Review of Systems  Constitutional: Negative for fever and chills.  HENT: Positive for congestion and sinus pressure.  Negative for ear pain.   Respiratory: Positive for cough. Negative for shortness of breath and wheezing.   Cardiovascular: Negative for chest pain.  Genitourinary: Negative for dysuria.  Hematological: Negative for adenopathy.       Objective:   Physical Exam  Constitutional: He is oriented to person, place, and time. He appears well-developed and well-nourished.  HENT:  Right Ear: External ear normal.  Left Ear: External ear normal.  Mouth/Throat: Oropharynx is clear and moist.  Eyes: Pupils are equal, round, and reactive to light.  Neck: Neck supple. No thyromegaly present.  Cardiovascular: Normal rate and regular rhythm.   Pulmonary/Chest: Effort normal and breath sounds normal. No respiratory distress. He has no wheezes. He has no rales.  Musculoskeletal: He exhibits no edema.  Neurological: He is alert and oriented to person, place, and time.  Skin:  Ecchymosis right proximal medial calf. He has area of ecchymosis 3 x 4 cm and hematoma 1 cm. Minimally tender  Shaft of penis reveals a few small epidermal cysts. They have classic small black center and cystic structure          Assessment & Plan:  #1 chronic rhinitis. No active symptoms of infection. Question vasomotor or nonallergic rhinitis component. Increase Flonase to 2 sprays per nostril once daily. Continue saline sinus irrigation. No further antibiotics at this time #2 benign epidermal inclusion cyst shaft of penis. Reassurance. No evidence for HPV  #3 dry cough. Refill Tessalon Perles for as needed use #4 nontraumatic ecchymosis right calf.  Reassurance 

## 2013-12-05 ENCOUNTER — Ambulatory Visit: Payer: BC Managed Care – PPO | Admitting: Family Medicine

## 2013-12-23 ENCOUNTER — Other Ambulatory Visit: Payer: Self-pay | Admitting: Family Medicine

## 2013-12-28 ENCOUNTER — Encounter: Payer: Self-pay | Admitting: Family Medicine

## 2013-12-28 ENCOUNTER — Ambulatory Visit (INDEPENDENT_AMBULATORY_CARE_PROVIDER_SITE_OTHER): Payer: BC Managed Care – PPO | Admitting: Family Medicine

## 2013-12-28 VITALS — BP 124/80 | HR 77 | Temp 97.5°F | Wt 207.0 lb

## 2013-12-28 DIAGNOSIS — J209 Acute bronchitis, unspecified: Secondary | ICD-10-CM

## 2013-12-28 MED ORDER — BENZONATATE 200 MG PO CAPS: ORAL_CAPSULE | ORAL | Status: DC

## 2013-12-28 NOTE — Progress Notes (Signed)
Pre visit review using our clinic review tool, if applicable. No additional management support is needed unless otherwise documented below in the visit note. 

## 2013-12-28 NOTE — Patient Instructions (Signed)

## 2013-12-28 NOTE — Progress Notes (Signed)
   Subjective:    Patient ID: Peter Snyder, male    DOB: 05-21-1955, 58 y.o.   MRN: [id number]  Cough Pertinent negatives include no chills or fever.   Patient seen with one-day history of cough. Had some nasal congestion but mostly cough. Mostly dry cough. No fever. No chills. Some loose stools couple days ago but none today after taking Imodium. No nausea or vomiting. No abdominal pain. Nonsmoker  Past Medical History  Diagnosis Date  . Arthritis   . Chicken pox   . Depression   . GERD (gastroesophageal reflux disease)   . CAD (coronary artery disease) 6/14    Cardiac catheterization 6/27/ 2014 ejection fraction 35-40%, 30% proximal left circumflex, 100% tiny obtuse marginal 1 with collaterals, 50% LAD, 50% D1, 100% RCA with collaterals  . Hypertension   . Hyperlipidemia   . Kidney stone   . Colon polyps   . Diabetes mellitus     Diet controlled   Past Surgical History  Procedure Laterality Date  . Tonsillectomy  1963    reports that he has never smoked. He does not have any smokeless tobacco history on file. He reports that he does not drink alcohol or use illicit drugs. family history includes Arthritis in his father and paternal grandmother; Heart disease in his maternal grandmother; Hyperlipidemia in his maternal grandmother. Allergies  Allergen Reactions  . Sulfa Antibiotics       Review of Systems  Constitutional: Negative for fever and chills.  Respiratory: Positive for cough.        Objective:   Physical Exam  Constitutional: He appears well-developed and well-nourished.  HENT:  Right Ear: External ear normal.  Left Ear: External ear normal.  Mouth/Throat: Oropharynx is clear and moist.  Neck: Neck supple.  Cardiovascular: Normal rate and regular rhythm.   Pulmonary/Chest: Effort normal and breath sounds normal. No respiratory distress. He has no wheezes. He has no rales.          Assessment & Plan:  Cough. Suspect acute viral bronchitis. No  indication for antibiotics at this time. Refill Tessalon Perles 200 mg every 8 hours as needed for cough. Followup for any fever or worsening symptoms

## 2013-12-30 ENCOUNTER — Encounter: Payer: Self-pay | Admitting: Physician Assistant

## 2013-12-30 ENCOUNTER — Ambulatory Visit (INDEPENDENT_AMBULATORY_CARE_PROVIDER_SITE_OTHER): Payer: BC Managed Care – PPO | Admitting: Physician Assistant

## 2013-12-30 VITALS — BP 124/80 | HR 66 | Temp 98.1°F | Resp 18 | Wt 208.0 lb

## 2013-12-30 DIAGNOSIS — J209 Acute bronchitis, unspecified: Secondary | ICD-10-CM

## 2013-12-30 MED ORDER — HYDROCODONE-HOMATROPINE 5-1.5 MG/5ML PO SYRP: 5.0000 mL | ORAL_SOLUTION | Freq: Three times a day (TID) | ORAL | Status: DC | PRN

## 2013-12-30 NOTE — Patient Instructions (Addendum)
Hycodan every 8 hours as needed for cough. No driving while taking this medication as it will inhibit your ability to operate a motor vehicle.  Plain Over the Counter Mucinex (NOT Mucinex D) for thick secretions  Force NON dairy fluids, drinking plenty of water is best.    Over the Counter Flonase OR Nasacort AQ 1 spray in each nostril twice a day as needed. Use the "crossover" technique into opposite nostril spraying toward opposite ear @ 45 degree angle, not straight up into nostril.   Plain Over the Counter Allegra (NOT D )  160 daily , OR Loratidine 10 mg , OR Zyrtec 10 mg @ bedtime  as needed for itchy eyes & sneezing.  Saline Irrigation and Saline Sprays can also help reduce symptoms.  If emergency symptoms discussed during visit developed, seek medical attention immediately.  Followup as needed, or for worsening or persistent symptoms despite treatment.   Acute Bronchitis Bronchitis is when the airways that extend from the windpipe into the lungs get red, puffy, and painful (inflamed). Bronchitis often causes thick spit (mucus) to develop. This leads to a cough. A cough is the most common symptom of bronchitis. In acute bronchitis, the condition usually begins suddenly and goes away over time (usually in 2 weeks). Smoking, allergies, and asthma can make bronchitis worse. Repeated episodes of bronchitis may cause more lung problems. HOME CARE  Rest.  Drink enough fluids to keep your pee (urine) clear or pale yellow (unless you need to limit fluids as told by your doctor).  Only take over-the-counter or prescription medicines as told by your doctor.  Avoid smoking and secondhand smoke. These can make bronchitis worse. If you are a smoker, think about using nicotine gum or skin patches. Quitting smoking will help your lungs heal faster.  Reduce the chance of getting bronchitis again by:  Washing your hands often.  Avoiding people with cold symptoms.  Trying not to touch your  hands to your mouth, nose, or eyes.  Follow up with your doctor as told. GET HELP IF: Your symptoms do not improve after 1 week of treatment. Symptoms include:  Cough.  Fever.  Coughing up thick spit.  Body aches.  Chest congestion.  Chills.  Shortness of breath.  Sore throat. GET HELP RIGHT AWAY IF:   You have an increased fever.  You have chills.  You have severe shortness of breath.  You have bloody thick spit (sputum).  You throw up (vomit) often.  You lose too much body fluid (dehydration).  You have a severe headache.  You faint. MAKE SURE YOU:   Understand these instructions.  Will watch your condition.  Will get help right away if you are not doing well or get worse. Document Released: 08/20/2007 Document Revised: 11/03/2012 Document Reviewed: 08/24/2012 [facility] Patient Information 2015 [place], [place]. This information is not intended to replace advice given to you by your health care provider. Make sure you discuss any questions you have with your health care provider.

## 2013-12-30 NOTE — Progress Notes (Signed)
Subjective:    Patient ID: Peter Snyder, male    DOB: Jun 23, 1955, 58 y.o.   MRN: [id number]  Cough This is a new problem. The current episode started in the past 7 days (4 days). The problem has been gradually worsening. The problem occurs every few minutes. The cough is productive of sputum. Associated symptoms include nasal congestion, postnasal drip and rhinorrhea. Pertinent negatives include no chest pain, chills, ear congestion, ear pain, fever, headaches, heartburn, hemoptysis, myalgias, rash, sore throat, shortness of breath, sweats, weight loss or wheezing. Nothing aggravates the symptoms. Treatments tried: tessalon perles, mucinex, flonase. The treatment provided mild relief. There is no history of asthma or COPD.      Review of Systems  Constitutional: Negative for fever, chills and weight loss.  HENT: Positive for dental problem, drooling, postnasal drip and rhinorrhea. Negative for ear pain, sinus pressure and sore throat.   Respiratory: Positive for cough. Negative for hemoptysis, shortness of breath and wheezing.   Cardiovascular: Negative for chest pain.  Gastrointestinal: Negative for heartburn, nausea, vomiting and diarrhea.  Musculoskeletal: Negative for myalgias.  Skin: Negative for rash.  Neurological: Negative for syncope and headaches.  All other systems reviewed and are negative.    Past Medical History  Diagnosis Date  . Arthritis   . Chicken pox   . Depression   . GERD (gastroesophageal reflux disease)   . CAD (coronary artery disease) 6/14    Cardiac catheterization 6/27/ 2014 ejection fraction 35-40%, 30% proximal left circumflex, 100% tiny obtuse marginal 1 with collaterals, 50% LAD, 50% D1, 100% RCA with collaterals  . Hypertension   . Hyperlipidemia   . Kidney stone   . Colon polyps   . Diabetes mellitus     Diet controlled    History   Social History  . Marital Status: Married    Spouse Name: N/A    Number of Children: 3  . Years of  Education: N/A   Occupational History  .      Retired   Social History Main Topics  . Smoking status: Never Smoker   . Smokeless tobacco: Not on file  . Alcohol Use: No  . Drug Use: No  . Sexual Activity: Not on file   Other Topics Concern  . Not on file   Social History Narrative  . No narrative on file    Past Surgical History  Procedure Laterality Date  . Tonsillectomy  1963    Family History  Problem Relation Age of Onset  . Arthritis Father   . Hyperlipidemia Maternal Grandmother   . Heart disease Maternal Grandmother   . Arthritis Paternal Grandmother     Allergies  Allergen Reactions  . Sulfa Antibiotics     Current Outpatient Prescriptions on File Prior to Visit  Medication Sig Dispense Refill  . ALPRAZolam (XANAX) 0.25 MG tablet Take 0.25 mg by mouth at bedtime as needed for sleep.      Marland Kitchen aspirin 81 MG tablet Take 81 mg by mouth daily.      Marland Kitchen atorvastatin (LIPITOR) 80 MG tablet TAKE 1 TABLET(S) EVERY DAY BY ORAL ROUTE.  90 tablet  3  . azelastine (ASTELIN) 0.1 % nasal spray Place 2 sprays into both nostrils 2 (two) times daily. Use in each nostril as directed  30 mL  12  . benzonatate (TESSALON) 200 MG capsule Take 1 tablet by mouth every 8 hours prn for cough  30 capsule  0  . carvedilol (COREG) 12.5 MG tablet Take  1 tablet (12.5 mg total) by mouth 2 (two) times daily with a meal.  270 tablet  2  . celecoxib (CELEBREX) 200 MG capsule Take 200 mg by mouth daily.       . clonazePAM (KLONOPIN) 0.5 MG tablet Once daily as needed for sleep      . desoximetasone (TOPICORT) 0.05 % cream Use bid to affected areas prn but do not use more than 2 weeks continuously.  60 g  2  . escitalopram (LEXAPRO) 10 MG tablet Take 10 mg by mouth daily.      Marland Kitchen esomeprazole (NEXIUM) 40 MG capsule TAKE ONE CAPSULE BY MOUTH EVERY DAY  90 capsule  1  . ezetimibe (ZETIA) 10 MG tablet Take 1 tablet (10 mg total) by mouth daily.  30 tablet  11  . fish oil-omega-3 fatty acids 1000 MG  capsule Take 2 g by mouth daily.      . fluticasone (FLONASE) 50 MCG/ACT nasal spray Place 2 sprays into both nostrils daily.  16 g  6  . gabapentin (NEURONTIN) 600 MG tablet Take 1 tablet (600 mg total) by mouth 3 (three) times daily.  270 tablet  3  . lisinopril (PRINIVIL,ZESTRIL) 5 MG tablet Take 1 tablet (5 mg total) by mouth daily.  90 tablet  3  . LOCOID LIPOCREAM 0.1 % CREA Place on area prn      . predniSONE (DELTASONE) 10 MG tablet Taper as follows 4-4-3-3-2-2-1-1  20 tablet  0  . RESTASIS 0.05 % ophthalmic emulsion Place 1 drop into both eyes 2 (two) times daily.       . tamsulosin (FLOMAX) 0.4 MG CAPS capsule TAKE 1 CAPSULE (0.4MG ) BY ORAL ROUTE EVERY DAY 1/2 HOUR FOLLOWING THE SAME MEAL EACH DAY  30 capsule  11  . VOLTAREN 1 % GEL Apply 2 g topically 2 (two) times daily.       Marland Kitchen zolpidem (AMBIEN) 10 MG tablet Take 10 mg by mouth at bedtime as needed for sleep.       No current facility-administered medications on file prior to visit.    EXAM: BP 124/80  Pulse 66  Temp(Src) 98.1 F (36.7 C) (Oral)  Resp 18  Wt 208 lb (94.348 kg)     Objective:   Physical Exam  Nursing note and vitals reviewed. Constitutional: He is oriented to person, place, and time. He appears well-developed and well-nourished. No distress.  HENT:  Head: Normocephalic and atraumatic.  Right Ear: External ear normal.  Left Ear: External ear normal.  Nose: Nose normal.  Mouth/Throat: No oropharyngeal exudate.  Oropharynx is slightly erythematous, no exudate. Bilateral TMs normal. Bilateral frontal and maxillary sinuses non-TTP.  Eyes: Conjunctivae and EOM are normal.  Neck: Normal range of motion. Neck supple.  Cardiovascular: Normal rate, regular rhythm and intact distal pulses.   Pulmonary/Chest: Effort normal and breath sounds normal. No stridor. No respiratory distress. He has no wheezes. He has no rales. He exhibits no tenderness.  Lungs CTAB.  Lymphadenopathy:    He has no cervical  adenopathy.  Neurological: He is alert and oriented to person, place, and time.  Skin: Skin is warm and dry. He is not diaphoretic. No pallor.  Psychiatric: He has a normal mood and affect. His behavior is normal. Judgment and thought content normal.     Lab Results  Component Value Date   WBC 7.4 04/29/2013   HGB 14.9 04/29/2013   HCT 45.3 04/29/2013   PLT 166.0 04/29/2013   GLUCOSE 107*  04/29/2013   CHOL 142 06/24/2013   TRIG 157.0* 06/24/2013   HDL 43.40 06/24/2013   LDLCALC 67 06/24/2013   ALT 16 04/29/2013   AST 15 04/29/2013   NA 141 04/29/2013   K 4.9 04/29/2013   CL 102 04/29/2013   CREATININE 1.0 04/29/2013   BUN 17 04/29/2013   CO2 31 04/29/2013   TSH 2.46 04/29/2013   PSA 1.14 04/29/2013   HGBA1C 6.3 04/29/2013        Assessment & Plan:  Andrews was seen today for cough.  Diagnoses and associated orders for this visit:  Acute bronchitis, unspecified organism Comments: Trial of Hycodan for cough. OTC mucinex, nasal steroid, antihistamine, rest, push fluids, watchful waiting. - HYDROcodone-homatropine (HYCODAN) 5-1.5 MG/5ML syrup; Take 5 mLs by mouth every 8 (eight) hours as needed for cough (No driving while taking this medication as it will inhibit your ability to operate a motor vehicle.).    Return precautions provided, and patient handout on Bronchitis.  Plan to follow up as needed, or for worsening or persistent symptoms despite treatment.  Patient Instructions  Hycodan every 8 hours as needed for cough. No driving while taking this medication as it will inhibit your ability to operate a motor vehicle.  Plain Over the Counter Mucinex (NOT Mucinex D) for thick secretions  Force NON dairy fluids, drinking plenty of water is best.    Over the Counter Flonase OR Nasacort AQ 1 spray in each nostril twice a day as needed. Use the "crossover" technique into opposite nostril spraying toward opposite ear @ 45 degree angle, not straight up into nostril.   Plain Over the  Counter Allegra (NOT D )  160 daily , OR Loratidine 10 mg , OR Zyrtec 10 mg @ bedtime  as needed for itchy eyes & sneezing.  Saline Irrigation and Saline Sprays can also help reduce symptoms.  If emergency symptoms discussed during visit developed, seek medical attention immediately.  Followup as needed, or for worsening or persistent symptoms despite treatment.

## 2013-12-30 NOTE — Progress Notes (Signed)
Pre visit review using our clinic review tool, if applicable. No additional management support is needed unless otherwise documented below in the visit note. 

## 2014-01-02 ENCOUNTER — Telehealth: Payer: Self-pay | Admitting: Family Medicine

## 2014-01-02 NOTE — Telephone Encounter (Signed)
Pt saw PA on 12-30-13 for bronchitis and was told if no better he will call abx into [facility]

## 2014-01-03 ENCOUNTER — Telehealth: Payer: Self-pay | Admitting: Cardiology

## 2014-01-03 MED ORDER — DOXYCYCLINE HYCLATE 100 MG PO CAPS: 100.0000 mg | ORAL_CAPSULE | Freq: Two times a day (BID) | ORAL | Status: DC

## 2014-01-03 NOTE — Telephone Encounter (Signed)
Left message for patient to return call.

## 2014-01-03 NOTE — Telephone Encounter (Signed)
Pt informed. Rx sent to pharmacy  

## 2014-01-03 NOTE — Telephone Encounter (Signed)
Most bronchitis is viral and will not improve with antibiotics and typical duration of cough is about 3 weeks.  However, if productive cough remains may start Doxycycline 100 mg caps one po bid for 10 days.  Follow up for any fever or increased shortness of breath.

## 2014-01-04 NOTE — Telephone Encounter (Signed)
Closed encounter °

## 2014-01-09 ENCOUNTER — Other Ambulatory Visit: Payer: Self-pay | Admitting: Family Medicine

## 2014-01-11 ENCOUNTER — Encounter: Payer: Self-pay | Admitting: Family Medicine

## 2014-01-11 ENCOUNTER — Ambulatory Visit (INDEPENDENT_AMBULATORY_CARE_PROVIDER_SITE_OTHER): Payer: BC Managed Care – PPO | Admitting: Family Medicine

## 2014-01-11 VITALS — BP 120/80 | HR 72 | Temp 98.3°F | Wt 208.0 lb

## 2014-01-11 DIAGNOSIS — R040 Epistaxis: Secondary | ICD-10-CM

## 2014-01-11 NOTE — Progress Notes (Signed)
Pre visit review using our clinic review tool, if applicable. No additional management support is needed unless otherwise documented below in the visit note. 

## 2014-01-11 NOTE — Progress Notes (Signed)
   Subjective:    Patient ID: Peter Snyder, male    DOB: November 07, 1955, 58 y.o.   MRN: [id number]  Epistaxis  The bleeding has been from the right nare. This is a new problem. Episode onset: 3 days ago. Episode frequency: every morning. The problem has been gradually worsening (Today has been the most blood noted.). The bleeding is associated with recent URI (Takes a baby ASA, but no blood thinners. ). Treatments tried: Packs with toilet paper and uses saline rinses. His past medical history is significant for allergies and sinus problems. There is no history of a bleeding disorder or frequent nosebleeds.      Review of Systems  Constitutional: Negative for fever, activity change and fatigue.  HENT: Positive for nosebleeds. Negative for congestion, ear pain, postnasal drip, rhinorrhea, sinus pressure and sore throat.        Recently got over bronchitis.  Those symptoms have now resolved and he has no other congestion like symptoms.  He tips his head back to stop the bleeding, but has not noted blood draining down his throat.   Eyes: Negative.   Respiratory: Negative for cough, chest tightness and shortness of breath.   Cardiovascular: Negative for chest pain.  Gastrointestinal: Negative for nausea and vomiting.  Neurological: Negative for dizziness, weakness, light-headedness, numbness and headaches.  Hematological: Does not bruise/bleed easily.       Objective:   Physical Exam  Nursing note and vitals reviewed. Constitutional: He is oriented to person, place, and time. He appears well-developed and well-nourished. No distress.  HENT:  Head: Normocephalic and atraumatic.  Mouth/Throat: Oropharynx is clear and moist. No oropharyngeal exudate.  Right anterior nostril erythematous along the septum; likely source of bleeding.  Left nostril clear.   Eyes: Conjunctivae are normal. Pupils are equal, round, and reactive to light.  Neck: Normal range of motion. Neck supple. No tracheal deviation  present. No thyromegaly present.  Cardiovascular: Normal rate, regular rhythm and normal heart sounds.   No murmur heard. Pulmonary/Chest: Effort normal and breath sounds normal. No respiratory distress. He has no wheezes.  Lymphadenopathy:    He has no cervical adenopathy.  Neurological: He is alert and oriented to person, place, and time.  Skin: He is not diaphoretic.  Psychiatric: He has a normal mood and affect. His behavior is normal. Judgment and thought content normal.       Assessment & Plan:  Epistaxis- Anterior; silver nitrate applied in the right nostril.  Patient educated on various causes of nose bleeds, and was given information.  Recommended using saline rinses, after giving today off, as well as applying Vaseline in the nostrils at night to prevent drying out the nares while sleeping.    Peter Art, PA-S  As above.  Silver nitrate applied to right anterior septum.  Avoid ASA next few days Peter Snyder M.D.

## 2014-01-11 NOTE — Patient Instructions (Signed)

## 2014-01-25 ENCOUNTER — Other Ambulatory Visit: Payer: Self-pay | Admitting: Family Medicine

## 2014-03-08 ENCOUNTER — Other Ambulatory Visit (INDEPENDENT_AMBULATORY_CARE_PROVIDER_SITE_OTHER): Payer: BC Managed Care – PPO

## 2014-03-08 DIAGNOSIS — Z0000 Encounter for general adult medical examination without abnormal findings: Secondary | ICD-10-CM

## 2014-03-08 DIAGNOSIS — E785 Hyperlipidemia, unspecified: Secondary | ICD-10-CM

## 2014-03-08 LAB — HEPATIC FUNCTION PANEL
ALT: 24 U/L (ref 0–53)
AST: 22 U/L (ref 0–37)
Albumin: 4.4 g/dL (ref 3.5–5.2)
Alkaline Phosphatase: 57 U/L (ref 39–117)
Bilirubin, Direct: 0.1 mg/dL (ref 0.0–0.3)
TOTAL PROTEIN: 6.6 g/dL (ref 6.0–8.3)
Total Bilirubin: 0.7 mg/dL (ref 0.2–1.2)

## 2014-03-08 LAB — CBC WITH DIFFERENTIAL/PLATELET
BASOS PCT: 0.6 % (ref 0.0–3.0)
Basophils Absolute: 0 10*3/uL (ref 0.0–0.1)
EOS ABS: 0.1 10*3/uL (ref 0.0–0.7)
Eosinophils Relative: 2.6 % (ref 0.0–5.0)
HCT: 42.5 % (ref 39.0–52.0)
HEMOGLOBIN: 14.1 g/dL (ref 13.0–17.0)
Lymphocytes Relative: 30.4 % (ref 12.0–46.0)
Lymphs Abs: 1.7 10*3/uL (ref 0.7–4.0)
MCHC: 33.2 g/dL (ref 30.0–36.0)
MCV: 92.9 fl (ref 78.0–100.0)
MONO ABS: 0.5 10*3/uL (ref 0.1–1.0)
Monocytes Relative: 8.7 % (ref 3.0–12.0)
Neutro Abs: 3.1 10*3/uL (ref 1.4–7.7)
Neutrophils Relative %: 57.7 % (ref 43.0–77.0)
Platelets: 145 10*3/uL — ABNORMAL LOW (ref 150.0–400.0)
RBC: 4.57 Mil/uL (ref 4.22–5.81)
RDW: 12.7 % (ref 11.5–15.5)
WBC: 5.4 10*3/uL (ref 4.0–10.5)

## 2014-03-08 LAB — BASIC METABOLIC PANEL
BUN: 15 mg/dL (ref 6–23)
CHLORIDE: 104 meq/L (ref 96–112)
CO2: 29 meq/L (ref 19–32)
Calcium: 9.4 mg/dL (ref 8.4–10.5)
Creatinine, Ser: 1 mg/dL (ref 0.4–1.5)
GFR: 83.26 mL/min (ref >60.00)
Glucose, Bld: 121 mg/dL — ABNORMAL HIGH (ref 70–99)
Potassium: 4.3 mEq/L (ref 3.5–5.1)
SODIUM: 140 meq/L (ref 135–145)

## 2014-03-08 LAB — LIPID PANEL
CHOLESTEROL: 151 mg/dL (ref 0–200)
HDL: 31.8 mg/dL — ABNORMAL LOW (ref >39.00)
NonHDL: 119.2
Total CHOL/HDL Ratio: 5
Triglycerides: 208 mg/dL — ABNORMAL HIGH (ref 0.0–149.0)
VLDL: 41.6 mg/dL — ABNORMAL HIGH (ref 0.0–40.0)

## 2014-03-08 LAB — POCT URINALYSIS DIPSTICK
Bilirubin, UA: NEGATIVE
Blood, UA: NEGATIVE
GLUCOSE UA: NEGATIVE
KETONES UA: NEGATIVE
LEUKOCYTES UA: NEGATIVE
Nitrite, UA: NEGATIVE
Protein, UA: NEGATIVE
Spec Grav, UA: 1.02
UROBILINOGEN UA: 0.2
pH, UA: 5.5

## 2014-03-08 LAB — TSH: TSH: 2.22 u[IU]/mL (ref 0.35–4.50)

## 2014-03-08 LAB — PSA: PSA: 1.42 ng/mL (ref 0.10–4.00)

## 2014-03-08 LAB — LDL CHOLESTEROL, DIRECT: Direct LDL: 87 mg/dL

## 2014-03-14 ENCOUNTER — Ambulatory Visit (INDEPENDENT_AMBULATORY_CARE_PROVIDER_SITE_OTHER): Payer: BC Managed Care – PPO | Admitting: Family Medicine

## 2014-03-14 ENCOUNTER — Encounter: Payer: Self-pay | Admitting: Family Medicine

## 2014-03-14 VITALS — BP 130/80 | HR 76 | Temp 97.5°F | Ht 74.0 in | Wt 210.0 lb

## 2014-03-14 DIAGNOSIS — R7303 Prediabetes: Secondary | ICD-10-CM

## 2014-03-14 DIAGNOSIS — R7309 Other abnormal glucose: Secondary | ICD-10-CM

## 2014-03-14 DIAGNOSIS — G4700 Insomnia, unspecified: Secondary | ICD-10-CM

## 2014-03-14 DIAGNOSIS — Z0000 Encounter for general adult medical examination without abnormal findings: Secondary | ICD-10-CM

## 2014-03-14 DIAGNOSIS — F5104 Psychophysiologic insomnia: Secondary | ICD-10-CM | POA: Insufficient documentation

## 2014-03-14 DIAGNOSIS — Z23 Encounter for immunization: Secondary | ICD-10-CM

## 2014-03-14 NOTE — Patient Instructions (Signed)

## 2014-03-14 NOTE — Addendum Note (Signed)
Addended by: Marcina Millard on: 03/14/2014 09:53 AM   Modules accepted: Orders

## 2014-03-14 NOTE — Progress Notes (Signed)
Pre visit review using our clinic review tool, if applicable. No additional management support is needed unless otherwise documented below in the visit note. 

## 2014-03-14 NOTE — Progress Notes (Signed)
Subjective:    Patient ID: Peter Snyder, male    DOB: 02-26-1956, 58 y.o.   MRN: [id number]  HPI Patient seen for complete physical. He has multiple chronic problems including history of CAD, idiopathic peripheral neuropathy, hyperlipidemia, prediabetes, GERD, kidney stones, BPH. He also has essential tremor which is been stable. Walks 2-3 miles per day for exercise.  He states he's had prediabetes for several years. Last tetanus about 10 years ago. Other immunizations up-to-date. Colonoscopy 2013. Overall feels well. No recent chest pains.  Past Medical History  Diagnosis Date  . Arthritis   . Chicken pox   . Depression   . GERD (gastroesophageal reflux disease)   . CAD (coronary artery disease) 6/14    Cardiac catheterization 6/27/ 2014 ejection fraction 35-40%, 30% proximal left circumflex, 100% tiny obtuse marginal 1 with collaterals, 50% LAD, 50% D1, 100% RCA with collaterals  . Hypertension   . Hyperlipidemia   . Kidney stone   . Colon polyps   . Diabetes mellitus     Diet controlled   Past Surgical History  Procedure Laterality Date  . Tonsillectomy  1963    reports that he has never smoked. He does not have any smokeless tobacco history on file. He reports that he does not drink alcohol or use illicit drugs. family history includes Arthritis in his father and paternal grandmother; Heart disease in his maternal grandmother; Hyperlipidemia in his maternal grandmother. Allergies  Allergen Reactions  . Sulfa Antibiotics       Review of Systems  Constitutional: Negative for fever, activity change, appetite change and fatigue.  HENT: Negative for congestion, ear pain and trouble swallowing.   Eyes: Negative for pain and visual disturbance.  Respiratory: Negative for cough, shortness of breath and wheezing.   Cardiovascular: Negative for chest pain and palpitations.  Gastrointestinal: Negative for nausea, vomiting, abdominal pain, diarrhea, constipation, blood in stool,  abdominal distention and rectal pain.  Genitourinary: Negative for dysuria, hematuria and testicular pain.  Musculoskeletal: Negative for joint swelling and arthralgias.  Skin: Negative for rash.  Neurological: Negative for dizziness, syncope and headaches.  Hematological: Negative for adenopathy.  Psychiatric/Behavioral: Negative for confusion and dysphoric mood.       Objective:   Physical Exam  Constitutional: He is oriented to person, place, and time. He appears well-developed and well-nourished. No distress.  HENT:  Head: Normocephalic and atraumatic.  Right Ear: External ear normal.  Left Ear: External ear normal.  Mouth/Throat: Oropharynx is clear and moist.  Eyes: Conjunctivae and EOM are normal. Pupils are equal, round, and reactive to light.  Neck: Normal range of motion. Neck supple. No thyromegaly present.  Cardiovascular: Normal rate, regular rhythm and normal heart sounds.   No murmur heard. Pulmonary/Chest: No respiratory distress. He has no wheezes. He has no rales.  Abdominal: Soft. Bowel sounds are normal. He exhibits no distension and no mass. There is no tenderness. There is no rebound and no guarding.  Musculoskeletal: He exhibits no edema.  Lymphadenopathy:    He has no cervical adenopathy.  Neurological: He is alert and oriented to person, place, and time. He displays normal reflexes. No cranial nerve deficit.  Skin: No rash noted.  Psychiatric: He has a normal mood and affect.          Assessment & Plan:  Complete physical. Tetanus given. We discussed his labs. He has minimally elevated triglycerides and low HDL and glucose 121. We discussed prediabetes and measures to prevent progression to type 2 diabetes.  Consider hemoglobin A1c at follow-up. Minimally decreased platelets of 145,000. Consider repeat CBC at follow-up

## 2014-03-20 ENCOUNTER — Telehealth: Payer: Self-pay | Admitting: Cardiology

## 2014-03-20 NOTE — Telephone Encounter (Signed)
Left message for pt to call.

## 2014-03-20 NOTE — Telephone Encounter (Signed)
New message     Pt c/o BP issue:  1. What are your last 5 BP readings? 03-20-14 130/86 8am  HR 78; 106/70 HR 70 11am; 03-19-14 137/86 HR 82 am, pm 90/60 HR 72 after walking 3 miles--120/78 7pm; 03-18-14 90/60 after walking and pt felt dizzy,  Later on 120/70 2. Are you having any other symptoms (ex. Dizziness, headache, blurred vision, passed out)? Dizzy on 03-18-14 after walking 3. What is your medication issue? On coreg 25mg  bid and lisinopril 5mg  in am

## 2014-03-20 NOTE — Telephone Encounter (Signed)
[facility] is returning your call Hilda Blades.Marland Kitchen

## 2014-03-21 MED ORDER — LISINOPRIL 2.5 MG PO TABS: 2.5000 mg | ORAL_TABLET | Freq: Every day | ORAL | Status: DC

## 2014-03-21 NOTE — Telephone Encounter (Signed)
Decrease lisinopril to 2.5 mg daily Kirk Ruths

## 2014-03-21 NOTE — Telephone Encounter (Signed)
Spoke with pt, Aware of dr crenshaw's recommendations.  °

## 2014-03-21 NOTE — Telephone Encounter (Signed)
Spoke with pt, he has been walking 2-3 miles daily if not too cold. Every time he walks his bp will be in the 90's and he will have dizziness when standing. Denies dizziness any other times. Prior to meds in the morning his bp is usually 136/86. He has a follow up appt 03-30-14. meds confirmed. Will forward for dr Stanford Breed review

## 2014-03-21 NOTE — Telephone Encounter (Signed)
Left message for pt to call.

## 2014-03-28 NOTE — Progress Notes (Signed)
HPI: FU coronary artery disease. Previous cardiac care in [place]. Echocardiogram in June 2014 showed an ejection fraction of 55%. Nuclear study in June of 2014 showed an ejection fraction of 43%, inferior and anterior defect and left ventricular dilatation with exercise. Cardiac catheterization performed in June of 2014 showed an ejection fraction of 35-40%. The inferior wall is akinetic. The left main was normal. There was a 30% proximal circumflex, occluded small first obtuse marginal with collaterals, a 50% ramus intermedius, 50% LAD, 50% first diagonal and an occluded right coronary artery with collaterals. Treated medically. Echocardiogram repeated in January of 2015. Ejection fraction 45-50%. Grade 1 diastolic dysfunction. Mild left atrial enlargement and mild mitral regurgitation. Siince last seen, the patient denies any dyspnea on exertion, orthopnea, PND, pedal edema, palpitations, syncope or chest pain.  Current Outpatient Prescriptions  Medication Sig Dispense Refill  . ALPRAZolam (XANAX) 0.25 MG tablet Take 0.25 mg by mouth at bedtime as needed for sleep.    Marland Kitchen aspirin 81 MG tablet Take 81 mg by mouth daily.    Marland Kitchen atorvastatin (LIPITOR) 80 MG tablet TAKE 1 TABLET(S) EVERY DAY BY ORAL ROUTE. 90 tablet 3  . azelastine (ASTELIN) 0.1 % nasal spray Place 2 sprays into both nostrils 2 (two) times daily. Use in each nostril as directed 30 mL 12  . carvedilol (COREG) 12.5 MG tablet Take 1 tablet (12.5 mg total) by mouth 2 (two) times daily with a meal. 270 tablet 2  . celecoxib (CELEBREX) 200 MG capsule Take 200 mg by mouth daily.     . clonazePAM (KLONOPIN) 0.5 MG tablet Once daily as needed for sleep    . desoximetasone (TOPICORT) 0.05 % cream Use bid to affected areas prn but do not use more than 2 weeks continuously. 60 g 2  . escitalopram (LEXAPRO) 10 MG tablet Take 10 mg by mouth daily.    Marland Kitchen esomeprazole (NEXIUM) 40 MG capsule TAKE ONE CAPSULE BY MOUTH EVERY DAY 90 capsule 1  .  ezetimibe (ZETIA) 10 MG tablet Take 1 tablet (10 mg total) by mouth daily. 30 tablet 11  . fish oil-omega-3 fatty acids 1000 MG capsule Take 2 g by mouth daily.    . fluticasone (FLONASE) 50 MCG/ACT nasal spray Place 2 sprays into both nostrils daily. 16 g 6  . gabapentin (NEURONTIN) 600 MG tablet Take 1 tablet (600 mg total) by mouth 3 (three) times daily. 270 tablet 3  . lisinopril (PRINIVIL,ZESTRIL) 2.5 MG tablet Take 1 tablet (2.5 mg total) by mouth daily. 30 tablet 12  . LOCOID LIPOCREAM 0.1 % CREA Place on area prn    . RESTASIS 0.05 % ophthalmic emulsion Place 1 drop into both eyes 2 (two) times daily.     . tamsulosin (FLOMAX) 0.4 MG CAPS capsule TAKE 1 CAPSULE (0.4MG ) EVERY DAY 1/2 HOUR FOLLOWING THE SAME MEAL EACH DAY 30 capsule 11  . VOLTAREN 1 % GEL Apply 2 g topically 2 (two) times daily.     Marland Kitchen zolpidem (AMBIEN) 10 MG tablet Take 10 mg by mouth at bedtime as needed for sleep.     No current facility-administered medications for this visit.     Past Medical History  Diagnosis Date  . Arthritis   . Chicken pox   . Depression   . GERD (gastroesophageal reflux disease)   . CAD (coronary artery disease) 6/14    Cardiac catheterization 6/27/ 2014 ejection fraction 35-40%, 30% proximal left circumflex, 100% tiny obtuse marginal 1 with collaterals,  50% LAD, 50% D1, 100% RCA with collaterals  . Hypertension   . Hyperlipidemia   . Kidney stone   . Colon polyps   . Diabetes mellitus     Diet controlled    Past Surgical History  Procedure Laterality Date  . Tonsillectomy  1963    History   Social History  . Marital Status: Married    Spouse Name: N/A    Number of Children: 3  . Years of Education: N/A   Occupational History  .      Retired   Social History Main Topics  . Smoking status: Never Smoker   . Smokeless tobacco: Not on file  . Alcohol Use: No  . Drug Use: No  . Sexual Activity: Not on file   Other Topics Concern  . Not on file   Social History  Narrative    ROS: no fevers or chills, productive cough, hemoptysis, dysphasia, odynophagia, melena, hematochezia, dysuria, hematuria, rash, seizure activity, orthopnea, PND, pedal edema, claudication. Remaining systems are negative.  Physical Exam: Well-developed well-nourished in no acute distress.  Skin is warm and dry.  HEENT is normal.  Neck is supple.  Chest is clear to auscultation with normal expansion.  Cardiovascular exam is regular rate and rhythm.  Abdominal exam nontender or distended. No masses palpated. Extremities show no edema. neuro grossly intact  ECG sinus rhythm at a rate of 72. Incomplete right bundle branch block. Inferior infarct.

## 2014-03-30 ENCOUNTER — Ambulatory Visit (INDEPENDENT_AMBULATORY_CARE_PROVIDER_SITE_OTHER): Payer: BLUE CROSS/BLUE SHIELD | Admitting: Cardiology

## 2014-03-30 ENCOUNTER — Encounter: Payer: Self-pay | Admitting: Cardiology

## 2014-03-30 VITALS — BP 130/80 | HR 72 | Ht 74.0 in | Wt 211.7 lb

## 2014-03-30 DIAGNOSIS — E785 Hyperlipidemia, unspecified: Secondary | ICD-10-CM

## 2014-03-30 DIAGNOSIS — I255 Ischemic cardiomyopathy: Secondary | ICD-10-CM

## 2014-03-30 DIAGNOSIS — I10 Essential (primary) hypertension: Secondary | ICD-10-CM

## 2014-03-30 DIAGNOSIS — I2510 Atherosclerotic heart disease of native coronary artery without angina pectoris: Secondary | ICD-10-CM

## 2014-03-30 DIAGNOSIS — I2584 Coronary atherosclerosis due to calcified coronary lesion: Secondary | ICD-10-CM

## 2014-03-30 NOTE — Patient Instructions (Signed)
Your physician wants you to follow-up in: ONE YEAR WITH DR CRENSHAW You will receive a reminder letter in the mail two months in advance. If you don't receive a letter, please call our office to schedule the follow-up appointment.  

## 2014-03-30 NOTE — Assessment & Plan Note (Signed)
Blood pressure controlled. Continue present medications. Potassium and renal function monitored by primary care. 

## 2014-03-30 NOTE — Assessment & Plan Note (Signed)
Continue aspirin and statin. 

## 2014-03-30 NOTE — Assessment & Plan Note (Signed)
Continue statin. Lipids and liver monitored by primary care. 

## 2014-03-30 NOTE — Assessment & Plan Note (Signed)
Continue ACE inhibitor and beta blocker. 

## 2014-03-31 ENCOUNTER — Telehealth: Payer: Self-pay | Admitting: Cardiology

## 2014-03-31 NOTE — Telephone Encounter (Signed)
New Message  Pt called reports that he has one more question to ask Dr. Stanford Breed. He states that every once in a whiile he has PVC's. He says his heart flip flops and he would like to call back to discuss if this is normal or not. Please call

## 2014-03-31 NOTE — Telephone Encounter (Signed)
Spoke with pt, reassurance given to patient. They only occur rarely.

## 2014-04-02 ENCOUNTER — Encounter (HOSPITAL_COMMUNITY): Payer: Self-pay | Admitting: Emergency Medicine

## 2014-04-02 ENCOUNTER — Observation Stay (HOSPITAL_COMMUNITY)
Admission: EM | Admit: 2014-04-02 | Discharge: 2014-04-03 | Disposition: A | Discharge status: Home / Self Care | Payer: BLUE CROSS/BLUE SHIELD | Attending: Internal Medicine | Admitting: Internal Medicine

## 2014-04-02 DIAGNOSIS — I10 Essential (primary) hypertension: Secondary | ICD-10-CM | POA: Insufficient documentation

## 2014-04-02 DIAGNOSIS — E785 Hyperlipidemia, unspecified: Secondary | ICD-10-CM | POA: Insufficient documentation

## 2014-04-02 DIAGNOSIS — K219 Gastro-esophageal reflux disease without esophagitis: Secondary | ICD-10-CM | POA: Diagnosis not present

## 2014-04-02 DIAGNOSIS — G629 Polyneuropathy, unspecified: Secondary | ICD-10-CM | POA: Diagnosis not present

## 2014-04-02 DIAGNOSIS — Z882 Allergy status to sulfonamides status: Secondary | ICD-10-CM | POA: Insufficient documentation

## 2014-04-02 DIAGNOSIS — R112 Nausea with vomiting, unspecified: Secondary | ICD-10-CM

## 2014-04-02 DIAGNOSIS — Z7982 Long term (current) use of aspirin: Secondary | ICD-10-CM | POA: Diagnosis not present

## 2014-04-02 DIAGNOSIS — I2510 Atherosclerotic heart disease of native coronary artery without angina pectoris: Secondary | ICD-10-CM | POA: Insufficient documentation

## 2014-04-02 DIAGNOSIS — F329 Major depressive disorder, single episode, unspecified: Secondary | ICD-10-CM | POA: Insufficient documentation

## 2014-04-02 DIAGNOSIS — M1990 Unspecified osteoarthritis, unspecified site: Secondary | ICD-10-CM | POA: Diagnosis not present

## 2014-04-02 DIAGNOSIS — F32A Depression, unspecified: Secondary | ICD-10-CM | POA: Diagnosis present

## 2014-04-02 DIAGNOSIS — K529 Noninfective gastroenteritis and colitis, unspecified: Principal | ICD-10-CM | POA: Insufficient documentation

## 2014-04-02 DIAGNOSIS — I255 Ischemic cardiomyopathy: Secondary | ICD-10-CM | POA: Diagnosis present

## 2014-04-02 DIAGNOSIS — Z86010 Personal history of colonic polyps: Secondary | ICD-10-CM | POA: Diagnosis not present

## 2014-04-02 DIAGNOSIS — R000 Tachycardia, unspecified: Secondary | ICD-10-CM

## 2014-04-02 LAB — COMPREHENSIVE METABOLIC PANEL
ALT: 28 U/L (ref 0–53)
AST: 29 U/L (ref 0–37)
Albumin: 5.1 g/dL (ref 3.5–5.2)
Alkaline Phosphatase: 64 U/L (ref 39–117)
Anion gap: 7 (ref 5–15)
BILIRUBIN TOTAL: 1.1 mg/dL (ref 0.3–1.2)
BUN: 16 mg/dL (ref 6–23)
CALCIUM: 9.2 mg/dL (ref 8.4–10.5)
CO2: 25 mmol/L (ref 19–32)
Chloride: 103 mEq/L (ref 96–112)
Creatinine, Ser: 0.88 mg/dL (ref 0.50–1.35)
GFR calc non Af Amer: >90 mL/min (ref >90)
GLUCOSE: 136 mg/dL — AB (ref 70–99)
Potassium: 3.9 mmol/L (ref 3.5–5.1)
Sodium: 135 mmol/L (ref 135–145)
TOTAL PROTEIN: 7.7 g/dL (ref 6.0–8.3)

## 2014-04-02 LAB — CBC WITH DIFFERENTIAL/PLATELET
Basophils Absolute: 0 10*3/uL (ref 0.0–0.1)
Basophils Relative: 0 % (ref 0–1)
EOS PCT: 1 % (ref 0–5)
Eosinophils Absolute: 0.1 10*3/uL (ref 0.0–0.7)
HEMATOCRIT: 45.5 % (ref 39.0–52.0)
Hemoglobin: 15.6 g/dL (ref 13.0–17.0)
Lymphocytes Relative: 4 % — ABNORMAL LOW (ref 12–46)
Lymphs Abs: 0.3 10*3/uL — ABNORMAL LOW (ref 0.7–4.0)
MCH: 31.6 pg (ref 26.0–34.0)
MCHC: 34.3 g/dL (ref 30.0–36.0)
MCV: 92.1 fL (ref 78.0–100.0)
Monocytes Absolute: 0.2 10*3/uL (ref 0.1–1.0)
Monocytes Relative: 2 % — ABNORMAL LOW (ref 3–12)
NEUTROS ABS: 8.3 10*3/uL — AB (ref 1.7–7.7)
NEUTROS PCT: 93 % — AB (ref 43–77)
Platelets: 126 10*3/uL — ABNORMAL LOW (ref 150–400)
RBC: 4.94 MIL/uL (ref 4.22–5.81)
RDW: 11.9 % (ref 11.5–15.5)
WBC: 8.9 10*3/uL (ref 4.0–10.5)

## 2014-04-02 LAB — I-STAT TROPONIN, ED: Troponin i, poc: 0 ng/mL (ref 0.00–0.08)

## 2014-04-02 LAB — I-STAT CG4 LACTIC ACID, ED: Lactic Acid, Venous: 2.52 mmol/L — ABNORMAL HIGH (ref 0.5–2.2)

## 2014-04-02 LAB — LIPASE, BLOOD: Lipase: 25 U/L (ref 11–59)

## 2014-04-02 MED ORDER — ONDANSETRON HCL 4 MG/2ML IJ SOLN
4.0000 mg | Freq: Once | INTRAMUSCULAR | Status: AC
  Administered 2014-04-02: 4 mg via INTRAVENOUS
  Filled 2014-04-02: qty 2

## 2014-04-02 MED ORDER — ONDANSETRON HCL 4 MG PO TABS
4.0000 mg | ORAL_TABLET | Freq: Once | ORAL | Status: AC
  Administered 2014-04-02: 4 mg via ORAL
  Filled 2014-04-02: qty 1

## 2014-04-02 MED ORDER — SODIUM CHLORIDE 0.9 % IV BOLUS (SEPSIS)
1000.0000 mL | Freq: Once | INTRAVENOUS | Status: AC
  Administered 2014-04-02: 1000 mL via INTRAVENOUS

## 2014-04-02 MED ORDER — ONDANSETRON HCL 4 MG/2ML IJ SOLN: 4.0000 mg | Freq: Once | INTRAMUSCULAR | Status: DC

## 2014-04-02 NOTE — ED Notes (Signed)
Patient is attempting to give urine sample, will call out when finished so it can be collected.

## 2014-04-02 NOTE — ED Provider Notes (Signed)
CSN: [id number]     Arrival date & time 04/02/14  2038 History   First MD Initiated Contact with Patient 04/02/14 2201     Chief Complaint  Patient presents with  . Emesis     (Consider location/radiation/quality/duration/timing/severity/associated sxs/prior Treatment) Patient is a 59 y.o. male presenting with vomiting. The history is provided by the patient and the spouse. No language interpreter was used.  Emesis Associated symptoms: abdominal pain, chills and diarrhea   Peter Snyder is a 59 y/o M with PMHx of arthritis, depression, GERD, CAD, HTN, HLD, DM, colonic polyps, cardiac cath performed in 08/2012 that noted RCA with collaterals presenting to the ED with nausea, emesis, diarrhea that started today. Patient reported that he started to feel nauseous today at 4:00PM, stated that he started to have emesis at 6:00PM. Reported that he has had at least 8 episodes of emesis - 5 while at home and 4 while here in the ED - NB/NB, mainly food and stomach contents. Patient reported that he has been having watery diarrhea today as well. Stated that he has been having mild abdominal discomfort described as a queasy discomfort. Stated that he has been having dizziness intermittently throughout the day. Stated that he has not been able to keep any food or fluids down. Stated that he feels dehydrated and weak. Stated that he has been having the chills and shakes throughout the day. Patient reported that he felt fine yesterday and stated that he felt fine earlier this morning when he went for a walk. Patient reported that he ate [employer] yesterday and stated that he felt fine after eating the food. Reported that on Thursday he received cervical steroid injections. Denied fever, chest pain, shortness of breath, difficulty breathing, melena, hematochezia, back pain, neck pain, dysuria, hematuria, urinary complaints.  PCP Dr. Elease Hashimoto Cardiology Dr. Stanford Breed   Past Medical History  Diagnosis Date  . Arthritis    . Chicken pox   . Depression   . GERD (gastroesophageal reflux disease)   . CAD (coronary artery disease) 6/14    Cardiac catheterization 6/27/ 2014 ejection fraction 35-40%, 30% proximal left circumflex, 100% tiny obtuse marginal 1 with collaterals, 50% LAD, 50% D1, 100% RCA with collaterals  . Hypertension   . Hyperlipidemia   . Kidney stone   . Colon polyps   . Diabetes mellitus     Diet controlled   Past Surgical History  Procedure Laterality Date  . Tonsillectomy  1963   Family History  Problem Relation Age of Onset  . Arthritis Father   . Hyperlipidemia Maternal Grandmother   . Heart disease Maternal Grandmother   . Arthritis Paternal Grandmother    History  Substance Use Topics  . Smoking status: Never Smoker   . Smokeless tobacco: Not on file  . Alcohol Use: No    Review of Systems  Constitutional: Positive for chills.  Respiratory: Negative for chest tightness and shortness of breath.   Cardiovascular: Negative for chest pain.  Gastrointestinal: Positive for nausea, vomiting, abdominal pain and diarrhea. Negative for constipation, blood in stool and anal bleeding.  Musculoskeletal: Negative for back pain and neck pain.  Neurological: Positive for dizziness and light-headedness. Negative for weakness.      Allergies  Sulfa antibiotics  Home Medications   Prior to Admission medications   Medication Sig Start Date End Date Taking? Authorizing Provider  acetaminophen (TYLENOL) 500 MG tablet Take 500 mg by mouth every 6 (six) hours as needed for moderate pain.   Yes  Historical Provider, MD  ALPRAZolam Duanne Moron) 0.25 MG tablet Take 0.25 mg by mouth daily.    Yes Historical Provider, MD  aspirin 81 MG tablet Take 81 mg by mouth daily.   Yes Historical Provider, MD  atorvastatin (LIPITOR) 80 MG tablet TAKE 1 TABLET(S) EVERY DAY BY ORAL ROUTE. 11/10/13  Yes [name], MD  carvedilol (COREG) 12.5 MG tablet Take 1 tablet (12.5 mg total) by mouth 2 (two) times  daily with a meal. 07/26/13  Yes Lelon Perla, MD  celecoxib (CELEBREX) 200 MG capsule Take 200 mg by mouth daily.    Yes Historical Provider, MD  clonazePAM (KLONOPIN) 0.5 MG tablet Take 0.5 mg by mouth at bedtime.    Yes Historical Provider, MD  escitalopram (LEXAPRO) 10 MG tablet Take 10 mg by mouth daily.   Yes Historical Provider, MD  esomeprazole (NEXIUM) 40 MG capsule TAKE ONE CAPSULE BY MOUTH EVERY DAY 12/23/13  Yes [name], MD  ezetimibe (ZETIA) 10 MG tablet Take 1 tablet (10 mg total) by mouth daily. 05/04/13  Yes [name], MD  fish oil-omega-3 fatty acids 1000 MG capsule Take 2 g by mouth daily.   Yes Historical Provider, MD  fluticasone (FLONASE) 50 MCG/ACT nasal spray Place 2 sprays into both nostrils daily. 11/25/13  Yes [name], MD  gabapentin (NEURONTIN) 600 MG tablet Take 1 tablet (600 mg total) by mouth 3 (three) times daily. Patient taking differently: Take 600 mg by mouth 2 (two) times daily.  09/08/13  Yes [name], MD  lisinopril (PRINIVIL,ZESTRIL) 2.5 MG tablet Take 1 tablet (2.5 mg total) by mouth daily. 03/21/14  Yes Lelon Perla, MD  LOCOID LIPOCREAM 0.1 % CREA Apply 1 application topically daily. Place on area prn 12/07/12  Yes Historical Provider, MD  RESTASIS 0.05 % ophthalmic emulsion Place 1 drop into both eyes 2 (two) times daily.  11/20/12  Yes Historical Provider, MD  tamsulosin (FLOMAX) 0.4 MG CAPS capsule TAKE 1 CAPSULE (0.4MG ) EVERY DAY 1/2 HOUR FOLLOWING THE SAME MEAL EACH DAY 01/25/14  Yes [name], MD  VOLTAREN 1 % GEL Apply 2 g topically daily.  10/18/12  Yes Historical Provider, MD  zolpidem (AMBIEN) 10 MG tablet Take 10 mg by mouth at bedtime as needed for sleep.   Yes Historical Provider, MD  ZOVIRAX 5 % Take 1 application by mouth 4 (four) times daily - after meals and at bedtime.  03/28/14  Yes Historical Provider, MD  azelastine (ASTELIN) 0.1 % nasal spray Place 2 sprays into both nostrils 2 (two) times daily.  Use in each nostril as directed Patient not taking: Reported on 04/02/2014 10/26/13   [name], MD  desoximetasone (TOPICORT) 0.05 % cream Use bid to affected areas prn but do not use more than 2 weeks continuously. Patient not taking: Reported on 04/02/2014 03/30/13   [name], MD   BP 128/73 mmHg  Pulse 113  Temp(Src) 97.3 F (36.3 C) (Oral)  Resp 16  SpO2 92% Physical Exam  Constitutional: He is oriented to person, place, and time. He appears well-developed and well-nourished. No distress.  HENT:  Head: Normocephalic and atraumatic.  Dry mucus membranes  Eyes: Conjunctivae and EOM are normal. Right eye exhibits no discharge. Left eye exhibits no discharge.  Neck: Normal range of motion. Neck supple. No tracheal deviation present.  Cardiovascular: Regular rhythm and normal heart sounds.  Tachycardia present.  Exam reveals no friction rub.   No murmur heard.  Pulses:      Radial pulses are 2+ on the right side, and 2+ on the left side.       Dorsalis pedis pulses are 2+ on the right side, and 2+ on the left side.  Pulmonary/Chest: Effort normal and breath sounds normal. No respiratory distress. He has no wheezes. He has no rales.  Abdominal: Soft. Bowel sounds are normal. He exhibits no distension. There is tenderness. There is no rebound and no guarding.  Negative abdominal distension  BS normoactive in all 4 quadrants  Abdomen soft upon palpation  Diffuse tenderness upon palpation to the abdomen  Negative peritoneal signs  Negative guarding or rigidity noted  Musculoskeletal: Normal range of motion.  Full ROM to upper and lower extremities without difficulty noted, negative ataxia noted.  Lymphadenopathy:    He has no cervical adenopathy.  Neurological: He is alert and oriented to person, place, and time. No cranial nerve deficit. He exhibits normal muscle tone. Coordination normal.  Cranial nerves III-XII grossly intact Strength 5+/5+ to upper and lower  extremities bilaterally with resistance applied, equal distribution noted Equal grip strength bilaterally  Negative facial droop Negative slurred speech  Negative aphasia Negative arm drift  Skin: Skin is warm and dry. No rash noted. He is not diaphoretic. No erythema.  Psychiatric: He has a normal mood and affect. His behavior is normal. Thought content normal.  Nursing note and vitals reviewed.   ED Course  Procedures (including critical care time)  Results for orders placed or performed during the hospital encounter of 04/02/14  CBC with Differential  Result Value Ref Range   WBC 8.9 4.0 - 10.5 K/uL   RBC 4.94 4.22 - 5.81 MIL/uL   Hemoglobin 15.6 13.0 - 17.0 g/dL   HCT 45.5 39.0 - 52.0 %   MCV 92.1 78.0 - 100.0 fL   MCH 31.6 26.0 - 34.0 pg   MCHC 34.3 30.0 - 36.0 g/dL   RDW 11.9 11.5 - 15.5 %   Platelets 126 (L) 150 - 400 K/uL   Neutrophils Relative % 93 (H) 43 - 77 %   Neutro Abs 8.3 (H) 1.7 - 7.7 K/uL   Lymphocytes Relative 4 (L) 12 - 46 %   Lymphs Abs 0.3 (L) 0.7 - 4.0 K/uL   Monocytes Relative 2 (L) 3 - 12 %   Monocytes Absolute 0.2 0.1 - 1.0 K/uL   Eosinophils Relative 1 0 - 5 %   Eosinophils Absolute 0.1 0.0 - 0.7 K/uL   Basophils Relative 0 0 - 1 %   Basophils Absolute 0.0 0.0 - 0.1 K/uL  Comprehensive metabolic panel  Result Value Ref Range   Sodium 135 135 - 145 mmol/L   Potassium 3.9 3.5 - 5.1 mmol/L   Chloride 103 96 - 112 mEq/L   CO2 25 19 - 32 mmol/L   Glucose, Bld 136 (H) 70 - 99 mg/dL   BUN 16 6 - 23 mg/dL   Creatinine, Ser 0.88 0.50 - 1.35 mg/dL   Calcium 9.2 8.4 - 10.5 mg/dL   Total Protein 7.7 6.0 - 8.3 g/dL   Albumin 5.1 3.5 - 5.2 g/dL   AST 29 0 - 37 U/L   ALT 28 0 - 53 U/L   Alkaline Phosphatase 64 39 - 117 U/L   Total Bilirubin 1.1 0.3 - 1.2 mg/dL   GFR calc non Af Amer >90 >90 mL/min   GFR calc Af Amer >90 >90 mL/min   Anion gap 7 5 - 15  Lipase, blood  Result Value Ref Range   Lipase 25 11 - 59 U/L  Urinalysis, Routine w reflex  microscopic  Result Value Ref Range   Color, Urine YELLOW YELLOW   APPearance CLEAR CLEAR   Specific Gravity, Urine 1.018 1.005 - 1.030   pH 8.0 5.0 - 8.0   Glucose, UA NEGATIVE NEGATIVE mg/dL   Hgb urine dipstick NEGATIVE NEGATIVE   Bilirubin Urine NEGATIVE NEGATIVE   Ketones, ur 15 (A) NEGATIVE mg/dL   Protein, ur NEGATIVE NEGATIVE mg/dL   Urobilinogen, UA 0.2 0.0 - 1.0 mg/dL   Nitrite NEGATIVE NEGATIVE   Leukocytes, UA NEGATIVE NEGATIVE  I-stat troponin, ED (only if pt is 59 y.o. or older & pain is above umbilicus) - do not order at [facility]  Result Value Ref Range   Troponin i, poc 0.00 0.00 - 0.08 ng/mL   Comment 3          I-Stat CG4 Lactic Acid, ED  Result Value Ref Range   Lactic Acid, Venous 2.52 (H) 0.5 - 2.2 mmol/L  I-Stat CG4 Lactic Acid, ED  Result Value Ref Range   Lactic Acid, Venous 0.94 0.5 - 2.2 mmol/L    Labs Review Labs Reviewed  CBC WITH DIFFERENTIAL - Abnormal; Notable for the following:    Platelets 126 (*)    Neutrophils Relative % 93 (*)    Neutro Abs 8.3 (*)    Lymphocytes Relative 4 (*)    Lymphs Abs 0.3 (*)    Monocytes Relative 2 (*)    All other components within normal limits  COMPREHENSIVE METABOLIC PANEL - Abnormal; Notable for the following:    Glucose, Bld 136 (*)    All other components within normal limits  URINALYSIS, ROUTINE W REFLEX MICROSCOPIC - Abnormal; Notable for the following:    Ketones, ur 15 (*)    All other components within normal limits  I-STAT CG4 LACTIC ACID, ED - Abnormal; Notable for the following:    Lactic Acid, Venous 2.52 (*)    All other components within normal limits  LIPASE, BLOOD  I-STAT TROPOININ, ED  I-STAT CG4 LACTIC ACID, ED    Imaging Review Dg Abd Acute W/chest  04/03/2014   CLINICAL DATA:  Pt arrived to the ED with a complaint of emesis and diarrhea. Pt states he started to feel bad /around 1600 hrs Sunday 04/02/14. Pt states he is also a heart patient.  EXAM: ACUTE ABDOMEN SERIES (ABDOMEN 2 VIEW &  CHEST 1 VIEW)  COMPARISON:  None.  FINDINGS: There is no bowel dilation to suggest obstruction. There are scattered air-fluid levels within nondistended bowel, what appears to be predominantly colon. This is nonspecific. Gastroenteritis can have this appearance.  No free air.  Soft tissues are unremarkable.  No renal or ureteral stones.  Clear lungs.  Normal heart, mediastinum and hila.  IMPRESSION: 1. Nonspecific air-fluid levels within nondistended bowel suggests gastroenteritis. No evidence of obstruction or free air. 2. No active cardiopulmonary disease.   Electronically Signed   By: Lajean Manes M.D.   On: 04/03/2014 01:21     EKG Interpretation   Date/Time:  Sunday April 02 2014 22:28:01 EST Ventricular Rate:  96 PR Interval:  179 QRS Duration: 124 QT Interval:  354 QTC Calculation: 447 R Axis:   -42 Text Interpretation:  Sinus rhythm Left atrial enlargement Nonspecific  IVCD with LAD Inferior infarct, age indeterminate Baseline wander in  lead(s) III aVF No prior for comparison Confirmed by Mingo Amber  MD, Benjie Karvonen  (  [id number]) on 04/02/2014 11:02:08 PM      12:43 AM Patient seen and assessed by attending physician. Reported that patient feels better. Tachycardic at 110 bpm. Agreed with one more liter of fluids, repeat lactic and if continues to be tachycardic admit to medicine.   1:54 AM This provider spoke with Dr. Marlowe Sax, [facility] - discussed case, labs, imaging, vitals, ED course great detail. Patient to be admitted to hospital under the care of the [facility], MedSurg observation.  MDM   Final diagnoses:  Non-intractable vomiting with nausea, vomiting of unspecified type  Tachycardia  Gastroenteritis    Medications  ondansetron (ZOFRAN) tablet 4 mg (4 mg Oral Given 04/02/14 2105)  sodium chloride 0.9 % bolus 1,000 mL (0 mLs Intravenous Stopped 04/03/14 0022)  ondansetron (ZOFRAN) injection 4 mg (4 mg Intravenous Given 04/02/14 2358)  sodium chloride 0.9 % bolus  1,000 mL (0 mLs Intravenous Stopped 04/03/14 0128)    Filed Vitals:   04/02/14 2051 04/02/14 2230 04/03/14 0028  BP: 151/82 152/82 128/73  Pulse: 109 94 113  Temp: 97.3 F (36.3 C)    TempSrc: Oral    Resp: 18 19 16   SpO2: 99% 100% 92%   EKG noted sinus rhythm with a heart rate of 96 bpm-left atrial enlargement, nonspecific IVCD with LAD inferior infarct. I-STAT troponin negative elevation. CBC negative elevated leukocytosis. Hemoglobin 15.6, hematocrit 45.5. CMP unremarkable. Glucose 136, negative elevated anion gap-7.0 mg/L. Lipase negative elevation. Lactic acid elevated at 2.52. Urinalysis negative for nitrites, leukocytes, hemoglobin. Acute abdomen with chest negative for obstruction or free air, no acute pulmonary disease noted. Nonspecific air-fluid levels within nondistended bowel suggest gastroenteritis.  Repeat lactic acid after 2 L of fluids 0.94. Patient given IV fluids while in ED setting-2 L. Patient continues to remain tachycardic with a heart rate 113 bpm-115 bpm. Patient afebrile, when rechecked ages fever increased to 100.29F. Patient continues to remain tachycardic with a heart rate of 113 bpm. Patient continues to have episodes of emesis while in ED setting-vomiting poorly controlled. Suspicion to be viral gastroenteritis. Doubt DKA-patient does have a history of diabetes. Patient to be admitted to the hospital for hydration and emesis control. Patient to be admitted under the care of Dr. Posey Pronto, [facility]. Discussed with patient plan for admission who agrees to plan of care. Patient not septic appearing. Patient stable for transfer.  Jamse Mead, PA-C 04/03/14 0206  Julianne Rice, MD 04/03/14 [phone]

## 2014-04-02 NOTE — ED Notes (Signed)
Pt arrived to the ED with a complaint of emesis and diarrhea.  Pt states he started to feel bad around 1600 hrs today.  Pt states that he had five episodes of emesis since 1800 hrs.  Pt states he is also a heart patient.

## 2014-04-03 ENCOUNTER — Emergency Department (HOSPITAL_COMMUNITY): Payer: BLUE CROSS/BLUE SHIELD

## 2014-04-03 DIAGNOSIS — I255 Ischemic cardiomyopathy: Secondary | ICD-10-CM

## 2014-04-03 DIAGNOSIS — K529 Noninfective gastroenteritis and colitis, unspecified: Secondary | ICD-10-CM | POA: Diagnosis present

## 2014-04-03 DIAGNOSIS — I2510 Atherosclerotic heart disease of native coronary artery without angina pectoris: Secondary | ICD-10-CM

## 2014-04-03 DIAGNOSIS — K219 Gastro-esophageal reflux disease without esophagitis: Secondary | ICD-10-CM

## 2014-04-03 DIAGNOSIS — F329 Major depressive disorder, single episode, unspecified: Secondary | ICD-10-CM

## 2014-04-03 DIAGNOSIS — R1110 Vomiting, unspecified: Secondary | ICD-10-CM | POA: Insufficient documentation

## 2014-04-03 DIAGNOSIS — I10 Essential (primary) hypertension: Secondary | ICD-10-CM

## 2014-04-03 DIAGNOSIS — G629 Polyneuropathy, unspecified: Secondary | ICD-10-CM

## 2014-04-03 DIAGNOSIS — I2584 Coronary atherosclerosis due to calcified coronary lesion: Secondary | ICD-10-CM

## 2014-04-03 LAB — MAGNESIUM: Magnesium: 1.6 mg/dL (ref 1.5–2.5)

## 2014-04-03 LAB — CBC
HEMATOCRIT: 40.6 % (ref 39.0–52.0)
Hemoglobin: 13.4 g/dL (ref 13.0–17.0)
MCH: 30.5 pg (ref 26.0–34.0)
MCHC: 33 g/dL (ref 30.0–36.0)
MCV: 92.3 fL (ref 78.0–100.0)
PLATELETS: 111 10*3/uL — AB (ref 150–400)
RBC: 4.4 MIL/uL (ref 4.22–5.81)
RDW: 11.9 % (ref 11.5–15.5)
WBC: 7.2 10*3/uL (ref 4.0–10.5)

## 2014-04-03 LAB — COMPREHENSIVE METABOLIC PANEL
ALBUMIN: 3.9 g/dL (ref 3.5–5.2)
ALT: 21 U/L (ref 0–53)
ANION GAP: 7 (ref 5–15)
AST: 21 U/L (ref 0–37)
Alkaline Phosphatase: 49 U/L (ref 39–117)
BUN: 17 mg/dL (ref 6–23)
CHLORIDE: 104 meq/L (ref 96–112)
CO2: 27 mmol/L (ref 19–32)
Calcium: 8.4 mg/dL (ref 8.4–10.5)
Creatinine, Ser: 0.96 mg/dL (ref 0.50–1.35)
GFR calc Af Amer: >90 mL/min (ref >90)
GFR, EST NON AFRICAN AMERICAN: 90 mL/min — AB (ref >90)
Glucose, Bld: 129 mg/dL — ABNORMAL HIGH (ref 70–99)
Potassium: 4.2 mmol/L (ref 3.5–5.1)
Sodium: 138 mmol/L (ref 135–145)
Total Bilirubin: 0.9 mg/dL (ref 0.3–1.2)
Total Protein: 6.2 g/dL (ref 6.0–8.3)

## 2014-04-03 LAB — URINALYSIS, ROUTINE W REFLEX MICROSCOPIC
BILIRUBIN URINE: NEGATIVE
GLUCOSE, UA: NEGATIVE mg/dL
Hgb urine dipstick: NEGATIVE
Ketones, ur: 15 mg/dL — AB
LEUKOCYTES UA: NEGATIVE
Nitrite: NEGATIVE
Protein, ur: NEGATIVE mg/dL
Specific Gravity, Urine: 1.018 (ref 1.005–1.030)
Urobilinogen, UA: 0.2 mg/dL (ref 0.0–1.0)
pH: 8 (ref 5.0–8.0)

## 2014-04-03 LAB — I-STAT CG4 LACTIC ACID, ED: Lactic Acid, Venous: 0.94 mmol/L (ref 0.5–2.2)

## 2014-04-03 MED ORDER — ACETAMINOPHEN 650 MG RE SUPP: 650.0000 mg | Freq: Four times a day (QID) | RECTAL | Status: DC | PRN

## 2014-04-03 MED ORDER — CARVEDILOL 12.5 MG PO TABS
12.5000 mg | ORAL_TABLET | Freq: Two times a day (BID) | ORAL | Status: DC
  Administered 2014-04-03: 12.5 mg via ORAL
  Filled 2014-04-03 (×3): qty 1

## 2014-04-03 MED ORDER — ACETAMINOPHEN 325 MG PO TABS
650.0000 mg | ORAL_TABLET | Freq: Four times a day (QID) | ORAL | Status: DC | PRN
  Administered 2014-04-03: 650 mg via ORAL
  Filled 2014-04-03: qty 2

## 2014-04-03 MED ORDER — ALPRAZOLAM 0.25 MG PO TABS
0.2500 mg | ORAL_TABLET | Freq: Every day | ORAL | Status: DC
Start: 2014-04-03 — End: 2014-04-03
  Administered 2014-04-03: 0.25 mg via ORAL
  Filled 2014-04-03: qty 1

## 2014-04-03 MED ORDER — ASPIRIN 81 MG PO CHEW
81.0000 mg | CHEWABLE_TABLET | Freq: Every day | ORAL | Status: DC
  Administered 2014-04-03: 81 mg via ORAL
  Filled 2014-04-03: qty 1

## 2014-04-03 MED ORDER — ACETAMINOPHEN 500 MG PO TABS: 500.0000 mg | ORAL_TABLET | Freq: Four times a day (QID) | ORAL | Status: DC | PRN

## 2014-04-03 MED ORDER — ATORVASTATIN CALCIUM 80 MG PO TABS
80.0000 mg | ORAL_TABLET | Freq: Every day | ORAL | Status: DC
  Filled 2014-04-03: qty 1

## 2014-04-03 MED ORDER — SODIUM CHLORIDE 0.9 % IV SOLN
INTRAVENOUS | Status: DC
  Administered 2014-04-03: 03:00:00 via INTRAVENOUS

## 2014-04-03 MED ORDER — ENOXAPARIN SODIUM 40 MG/0.4ML ~~LOC~~ SOLN
40.0000 mg | SUBCUTANEOUS | Status: DC
  Filled 2014-04-03: qty 0.4

## 2014-04-03 MED ORDER — PANTOPRAZOLE SODIUM 40 MG PO TBEC
40.0000 mg | DELAYED_RELEASE_TABLET | Freq: Every day | ORAL | Status: DC
  Administered 2014-04-03: 40 mg via ORAL
  Filled 2014-04-03: qty 1

## 2014-04-03 MED ORDER — FLUTICASONE PROPIONATE 50 MCG/ACT NA SUSP
2.0000 | Freq: Every day | NASAL | Status: DC
  Administered 2014-04-03: 2 via NASAL
  Filled 2014-04-03: qty 16

## 2014-04-03 MED ORDER — EZETIMIBE 10 MG PO TABS
10.0000 mg | ORAL_TABLET | Freq: Every day | ORAL | Status: DC
  Filled 2014-04-03: qty 1

## 2014-04-03 MED ORDER — ESCITALOPRAM OXALATE 10 MG PO TABS
10.0000 mg | ORAL_TABLET | Freq: Every day | ORAL | Status: DC
  Administered 2014-04-03: 10 mg via ORAL
  Filled 2014-04-03: qty 1

## 2014-04-03 MED ORDER — TAMSULOSIN HCL 0.4 MG PO CAPS
0.4000 mg | ORAL_CAPSULE | Freq: Every day | ORAL | Status: DC
  Filled 2014-04-03: qty 1

## 2014-04-03 MED ORDER — ONDANSETRON HCL 4 MG PO TABS: 4.0000 mg | ORAL_TABLET | Freq: Four times a day (QID) | ORAL | Status: DC | PRN

## 2014-04-03 MED ORDER — SODIUM CHLORIDE 0.9 % IV BOLUS (SEPSIS)
1000.0000 mL | Freq: Once | INTRAVENOUS | Status: AC
  Administered 2014-04-03: 1000 mL via INTRAVENOUS

## 2014-04-03 MED ORDER — TAMSULOSIN HCL 0.4 MG PO CAPS
0.4000 mg | ORAL_CAPSULE | Freq: Every day | ORAL | Status: DC
  Administered 2014-04-03: 0.4 mg via ORAL
  Filled 2014-04-03 (×2): qty 1

## 2014-04-03 MED ORDER — ZOLPIDEM TARTRATE 10 MG PO TABS
10.0000 mg | ORAL_TABLET | Freq: Every evening | ORAL | Status: DC | PRN
  Administered 2014-04-03: 10 mg via ORAL
  Filled 2014-04-03: qty 1

## 2014-04-03 MED ORDER — ONDANSETRON HCL 4 MG/2ML IJ SOLN: 4.0000 mg | Freq: Four times a day (QID) | INTRAMUSCULAR | Status: DC | PRN

## 2014-04-03 MED ORDER — CLONAZEPAM 0.5 MG PO TABS
0.5000 mg | ORAL_TABLET | Freq: Every day | ORAL | Status: DC
  Administered 2014-04-03: 0.5 mg via ORAL
  Filled 2014-04-03: qty 1

## 2014-04-03 MED ORDER — GABAPENTIN 300 MG PO CAPS
600.0000 mg | ORAL_CAPSULE | Freq: Two times a day (BID) | ORAL | Status: DC
  Administered 2014-04-03 (×2): 600 mg via ORAL
  Filled 2014-04-03 (×3): qty 2

## 2014-04-03 MED ORDER — CLONAZEPAM 0.5 MG PO TABS: 0.5000 mg | ORAL_TABLET | Freq: Every day | ORAL | Status: DC

## 2014-04-03 MED ORDER — GABAPENTIN 600 MG PO TABS: 600.0000 mg | ORAL_TABLET | Freq: Two times a day (BID) | ORAL | Status: DC

## 2014-04-03 NOTE — Discharge Summary (Signed)
Physician Discharge Poneto QQV:[id number] DOB: February 02, 1956 DOA: 04/02/2014  PCP: [name], MD  Admit date: 04/02/2014 Discharge date: 04/03/2014   Recommendations for Outpatient Follow-Up:   1. F/U with PCP for non-resolution of symptoms.   Discharge Diagnosis:   Principal Problem:    Gastroenteritis Active Problems:    CAD (coronary artery disease)    GERD (gastroesophageal reflux disease)    Essential hypertension, benign    Depression    Cardiomyopathy, ischemic    Peripheral neuropathy   Discharge Condition: Improved.  Diet recommendation: Low sodium, heart healthy.    History of Present Illness:   Peter Snyder is an 59 y.o. male with a PMH of GERD, CAD, hypertension and dyslipidemia who was admitted 04/03/14 with a chief complaint of nausea, vomiting and diarrhea.   Hospital Course by Problem:   Principal Problem:  Gastroenteritis  Likely viral. Resolved with supportive care with IV fluids, antinausea medications and IV PPI therapy.  Able to tolerate advancement of his diet prior to discharge.  Active Problems:  CAD (coronary artery disease)  Stable. Continue aspirin, statin, beta blocker.   GERD (gastroesophageal reflux disease)  Continue PPI therapy.   Essential hypertension, benign  Resume antihypertensives at discharge.   Depression  Continue outpatient medications.   Cardiomyopathy, ischemic  Stable.   Peripheral neuropathy  Stable.   Medical Consultants:    None.   Discharge Exam:   Filed Vitals:   04/03/14 0531  BP: 111/66  Pulse: 91  Temp: 98.2 F (36.8 C)  Resp: 16   Filed Vitals:   04/03/14 0242 04/03/14 0300 04/03/14 0340 04/03/14 0531  BP: 124/74   111/66  Pulse: 108   91  Temp: 99 F (37.2 C)   98.2 F (36.8 C)  TempSrc: Oral   Oral  Resp: 18   16  Height:  6\' 2"  (1.88 m)    Weight:   91.536 kg (201 lb 12.8 oz)   SpO2: 95%   96%    Gen:  NAD Cardiovascular:  RRR,  No M/R/G Respiratory: Lungs CTAB Gastrointestinal: Abdomen soft, NT/ND with normal active bowel sounds. Extremities: No C/E/C   The results of significant diagnostics from this hospitalization (including imaging, microbiology, ancillary and laboratory) are listed below for reference.     Procedures and Diagnostic Studies:   Dg Abd Acute W/chest 04/03/2014: 1. Nonspecific air-fluid levels within nondistended bowel suggests gastroenteritis. No evidence of obstruction or free air. 2. No active cardiopulmonary disease.     Labs:   Basic Metabolic Panel:  Recent Labs Lab 04/02/14 2101 04/03/14 0515  NA 135 138  K 3.9 4.2  CL 103 104  CO2 25 27  GLUCOSE 136* 129*  BUN 16 17  CREATININE 0.88 0.96  CALCIUM 9.2 8.4  MG  --  1.6   GFR Estimated Creatinine Clearance: 97.5 mL/min (by C-G formula based on Cr of 0.96). Liver Function Tests:  Recent Labs Lab 04/02/14 2101 04/03/14 0515  AST 29 21  ALT 28 21  ALKPHOS 64 49  BILITOT 1.1 0.9  PROT 7.7 6.2  ALBUMIN 5.1 3.9    Recent Labs Lab 04/02/14 2101  LIPASE 25   CBC:  Recent Labs Lab 04/02/14 2101 04/03/14 0515  WBC 8.9 7.2  NEUTROABS 8.3*  --   HGB 15.6 13.4  HCT 45.5 40.6  MCV 92.1 92.3  PLT 126* 111*     Discharge Instructions:   Discharge Instructions    Call MD for:  extreme fatigue  Complete by:  As directed      Call MD for:  persistant nausea and vomiting    Complete by:  As directed      Call MD for:  temperature >100.4    Complete by:  As directed      Diet general    Complete by:  As directed      Discharge instructions    Complete by:  As directed   You were cared for by Dr. Jacquelynn Cree  (a hospitalist) during your hospital stay. If you have any questions about your discharge medications or the care you received while you were in the hospital after you are discharged, you can call the unit and ask to speak with the hospitalist on call if the hospitalist that took care of you is not  available. Once you are discharged, your primary care physician will handle any further medical issues. Please note that NO REFILLS for any discharge medications will be authorized once you are discharged, as it is imperative that you return to your primary care physician (or establish a relationship with a primary care physician if you do not have one) for your aftercare needs so that they can reassess your need for medications and monitor your lab values.  Any outstanding tests can be reviewed by your PCP at your follow up visit.  It is also important to review any medicine changes with your PCP.  Please bring these d/c instructions with you to your next visit so your physician can review these changes with you.  If you do not have a primary care physician, you can call [phone] for a physician referral.  It is highly recommended that you obtain a PCP for hospital follow up.     Increase activity slowly    Complete by:  As directed             Medication List    TAKE these medications        acetaminophen 500 MG tablet  Commonly known as:  TYLENOL  Take 500 mg by mouth every 6 (six) hours as needed for moderate pain.     ALPRAZolam 0.25 MG tablet  Commonly known as:  XANAX  Take 0.25 mg by mouth daily.     aspirin 81 MG tablet  Take 81 mg by mouth daily.     atorvastatin 80 MG tablet  Commonly known as:  LIPITOR  TAKE 1 TABLET(S) EVERY DAY BY ORAL ROUTE.     azelastine 0.1 % nasal spray  Commonly known as:  ASTELIN  Place 2 sprays into both nostrils 2 (two) times daily. Use in each nostril as directed     carvedilol 12.5 MG tablet  Commonly known as:  COREG  Take 1 tablet (12.5 mg total) by mouth 2 (two) times daily with a meal.     celecoxib 200 MG capsule  Commonly known as:  CELEBREX  Take 200 mg by mouth daily.     clonazePAM 0.5 MG tablet  Commonly known as:  KLONOPIN  Take 0.5 mg by mouth at bedtime.     desoximetasone 0.05 % cream  Commonly known as:  TOPICORT    Use bid to affected areas prn but do not use more than 2 weeks continuously.     escitalopram 10 MG tablet  Commonly known as:  LEXAPRO  Take 10 mg by mouth daily.     esomeprazole 40 MG capsule  Commonly known as:  NEXIUM  TAKE ONE CAPSULE  BY MOUTH EVERY DAY     ezetimibe 10 MG tablet  Commonly known as:  ZETIA  Take 1 tablet (10 mg total) by mouth daily.     fish oil-omega-3 fatty acids 1000 MG capsule  Take 2 g by mouth daily.     fluticasone 50 MCG/ACT nasal spray  Commonly known as:  FLONASE  Place 2 sprays into both nostrils daily.     gabapentin 600 MG tablet  Commonly known as:  NEURONTIN  Take 1 tablet (600 mg total) by mouth 2 (two) times daily.     lisinopril 2.5 MG tablet  Commonly known as:  PRINIVIL,ZESTRIL  Take 1 tablet (2.5 mg total) by mouth daily.     LOCOID LIPOCREAM 0.1 % Crea  Generic drug:  Hydrocortisone Butyr Lipo Base  Apply 1 application topically daily. Place on area prn     RESTASIS 0.05 % ophthalmic emulsion  Generic drug:  cycloSPORINE  Place 1 drop into both eyes 2 (two) times daily.     tamsulosin 0.4 MG Caps capsule  Commonly known as:  FLOMAX  TAKE 1 CAPSULE (0.4MG ) EVERY DAY 1/2 HOUR FOLLOWING THE SAME MEAL EACH DAY     VOLTAREN 1 % Gel  Generic drug:  diclofenac sodium  Apply 2 g topically daily.     zolpidem 10 MG tablet  Commonly known as:  AMBIEN  Take 10 mg by mouth at bedtime as needed for sleep.     ZOVIRAX 5 %  Generic drug:  acyclovir cream  Take 1 application by mouth 4 (four) times daily - after meals and at bedtime.           Follow-up Information    Follow up with [name], MD.   Specialty:  Family Medicine   Why:  If symptoms worsen   Contact information:   [place] [address] [phone]        Time coordinating discharge: 25 minutes.  Signed:  Thadius Smisek  Pager [phone] [facility] 04/03/2014, 2:12 PM

## 2014-04-03 NOTE — ED Notes (Signed)
Lactic acid of 0.94 reported to Dr. Lita Mains

## 2014-04-03 NOTE — Progress Notes (Signed)
DC instructions reviewed with patient and spouse. Home med rec carefully reviewed and patient instructed on when his meds are due. IV DC'ed. No changes noted since am assessment. Patient to DC to home with spouse. No Rx's needed. Patient denied further questions or concerns.

## 2014-04-03 NOTE — H&P (Signed)
[facility] History and Physical  Patient: Peter Snyder  NWG:[id number]  DOB: 08/19/1955  DOS: the patient was seen and examined on 04/03/2014 PCP: [name], MD  Chief Complaint:  Nausea vomiting and diarrhea  HPI: Peter Snyder is a 59 y.o. male with Past medical history of  GERD, coronary artery disease, hypertension, dyslipidemia.  the patient presented with complaints of nausea vomiting and diarrhea that started in the afternoon today. He denies any active blood or black color bowel movement but he did have 2 loose watery bowel movement. He denies any abdominal pain at the time of my evaluation. Denies any fever but felt feverish at home denies any chills. Denies any outside food. His wife was having some nausea and vomiting which they thought was secondary to migraine. Denies any travel anywhere. Denies any changes in his medication. In the emergency room the patient was continuing to have nausea and vomiting and was unable to take anything orally and therefore he was referred for admission.  The patient is coming from home. And at his baseline independent for most of his ADL.  Review of Systems: as mentioned in the history of present illness.  A Comprehensive review of the other systems is negative.  Past Medical History  Diagnosis Date  . Arthritis   . Chicken pox   . Depression   . GERD (gastroesophageal reflux disease)   . CAD (coronary artery disease) 6/14    Cardiac catheterization 6/27/ 2014 ejection fraction 35-40%, 30% proximal left circumflex, 100% tiny obtuse marginal 1 with collaterals, 50% LAD, 50% D1, 100% RCA with collaterals  . Hypertension   . Hyperlipidemia   . Kidney stone   . Colon polyps   . Diabetes mellitus     Diet controlled   Past Surgical History  Procedure Laterality Date  . Tonsillectomy  1963   Social History:  reports that he has never smoked. He does not have any smokeless tobacco history on file. He reports that he does not drink  alcohol or use illicit drugs.  Allergies  Allergen Reactions  . Sulfa Antibiotics Rash    Family History  Problem Relation Age of Onset  . Arthritis Father   . Hyperlipidemia Maternal Grandmother   . Heart disease Maternal Grandmother   . Arthritis Paternal Grandmother     Prior to Admission medications   Medication Sig Start Date End Date Taking? Authorizing Provider  acetaminophen (TYLENOL) 500 MG tablet Take 500 mg by mouth every 6 (six) hours as needed for moderate pain.   Yes Historical Provider, MD  ALPRAZolam (XANAX) 0.25 MG tablet Take 0.25 mg by mouth daily.    Yes Historical Provider, MD  aspirin 81 MG tablet Take 81 mg by mouth daily.   Yes Historical Provider, MD  atorvastatin (LIPITOR) 80 MG tablet TAKE 1 TABLET(S) EVERY DAY BY ORAL ROUTE. 11/10/13  Yes [name], MD  carvedilol (COREG) 12.5 MG tablet Take 1 tablet (12.5 mg total) by mouth 2 (two) times daily with a meal. 07/26/13  Yes Lelon Perla, MD  celecoxib (CELEBREX) 200 MG capsule Take 200 mg by mouth daily.    Yes Historical Provider, MD  clonazePAM (KLONOPIN) 0.5 MG tablet Take 0.5 mg by mouth at bedtime.    Yes Historical Provider, MD  escitalopram (LEXAPRO) 10 MG tablet Take 10 mg by mouth daily.   Yes Historical Provider, MD  esomeprazole (NEXIUM) 40 MG capsule TAKE ONE CAPSULE BY MOUTH EVERY DAY 12/23/13  Yes [name], MD  ezetimibe (ZETIA) 10 MG tablet Take 1 tablet (10 mg total) by mouth daily. 05/04/13  Yes [name], MD  fish oil-omega-3 fatty acids 1000 MG capsule Take 2 g by mouth daily.   Yes Historical Provider, MD  fluticasone (FLONASE) 50 MCG/ACT nasal spray Place 2 sprays into both nostrils daily. 11/25/13  Yes [name], MD  gabapentin (NEURONTIN) 600 MG tablet Take 1 tablet (600 mg total) by mouth 3 (three) times daily. Patient taking differently: Take 600 mg by mouth 2 (two) times daily.  09/08/13  Yes [name], MD  lisinopril (PRINIVIL,ZESTRIL) 2.5 MG  tablet Take 1 tablet (2.5 mg total) by mouth daily. 03/21/14  Yes Lelon Perla, MD  LOCOID LIPOCREAM 0.1 % CREA Apply 1 application topically daily. Place on area prn 12/07/12  Yes Historical Provider, MD  RESTASIS 0.05 % ophthalmic emulsion Place 1 drop into both eyes 2 (two) times daily.  11/20/12  Yes Historical Provider, MD  tamsulosin (FLOMAX) 0.4 MG CAPS capsule TAKE 1 CAPSULE (0.4MG ) EVERY DAY 1/2 HOUR FOLLOWING THE SAME MEAL EACH DAY 01/25/14  Yes [name], MD  VOLTAREN 1 % GEL Apply 2 g topically daily.  10/18/12  Yes Historical Provider, MD  zolpidem (AMBIEN) 10 MG tablet Take 10 mg by mouth at bedtime as needed for sleep.   Yes Historical Provider, MD  ZOVIRAX 5 % Take 1 application by mouth 4 (four) times daily - after meals and at bedtime.  03/28/14  Yes Historical Provider, MD  azelastine (ASTELIN) 0.1 % nasal spray Place 2 sprays into both nostrils 2 (two) times daily. Use in each nostril as directed Patient not taking: Reported on 04/02/2014 10/26/13   [name], MD  desoximetasone (TOPICORT) 0.05 % cream Use bid to affected areas prn but do not use more than 2 weeks continuously. Patient not taking: Reported on 04/02/2014 03/30/13   [name], MD    Physical Exam: Filed Vitals:   04/03/14 0028 04/03/14 0242 04/03/14 0300 04/03/14 0340  BP: 128/73 124/74    Pulse: 113 108    Temp:  99 F (37.2 C)    TempSrc:  Oral    Resp: 16 18    Height:   6\' 2"  (1.88 m)   Weight:    91.536 kg (201 lb 12.8 oz)  SpO2: 92% 95%      General: Alert, Awake and Oriented to Time, Place and Person. Appear in mild distress Eyes: PERRL ENT: Oral Mucosa clear dry. Neck: no JVD Cardiovascular: S1 and S2 Present, no Murmur, Peripheral Pulses Present Respiratory: Bilateral Air entry equal and Decreased, Clear to Auscultation, noCrackles, no wheezes Abdomen: Bowel Sound presnt, Soft and non tender Skin: no Rash Extremities: no Pedal edema, no calf tenderness Neurologic: Grossly  no focal neuro deficit.  Labs on Admission:  CBC:  Recent Labs Lab 04/02/14 2101  WBC 8.9  NEUTROABS 8.3*  HGB 15.6  HCT 45.5  MCV 92.1  PLT 126*    CMP     Component Value Date/Time   NA 135 04/02/2014 2101   K 3.9 04/02/2014 2101   CL 103 04/02/2014 2101   CO2 25 04/02/2014 2101   GLUCOSE 136* 04/02/2014 2101   BUN 16 04/02/2014 2101   CREATININE 0.88 04/02/2014 2101   CALCIUM 9.2 04/02/2014 2101   PROT 7.7 04/02/2014 2101   ALBUMIN 5.1 04/02/2014 2101   AST 29 04/02/2014 2101   ALT 28 04/02/2014 2101   ALKPHOS 64 04/02/2014  2101   BILITOT 1.1 04/02/2014 2101   GFRNONAA >90 04/02/2014 2101   GFRAA >90 04/02/2014 2101     Recent Labs Lab 04/02/14 2101  LIPASE 25   No results for input(s): AMMONIA in the last 168 hours.  No results for input(s): CKTOTAL, CKMB, CKMBINDEX, TROPONINI in the last 168 hours. BNP (last 3 results) No results for input(s): PROBNP in the last 8760 hours.  Radiological Exams on Admission: Dg Abd Acute W/chest  04/03/2014   CLINICAL DATA:  Pt arrived to the ED with a complaint of emesis and diarrhea. Pt states he started to feel bad /around 1600 hrs Sunday 04/02/14. Pt states he is also a heart patient.  EXAM: ACUTE ABDOMEN SERIES (ABDOMEN 2 VIEW & CHEST 1 VIEW)  COMPARISON:  None.  FINDINGS: There is no bowel dilation to suggest obstruction. There are scattered air-fluid levels within nondistended bowel, what appears to be predominantly colon. This is nonspecific. Gastroenteritis can have this appearance.  No free air.  Soft tissues are unremarkable.  No renal or ureteral stones.  Clear lungs.  Normal heart, mediastinum and hila.  IMPRESSION: 1. Nonspecific air-fluid levels within nondistended bowel suggests gastroenteritis. No evidence of obstruction or free air. 2. No active cardiopulmonary disease.   Electronically Signed   By: Lajean Manes M.D.   On: 04/03/2014 01:21     Assessment/Plan Principal Problem:   Gastroenteritis Active  Problems:   CAD (coronary artery disease)   GERD (gastroesophageal reflux disease)   Essential hypertension, benign   Depression   Cardiomyopathy, ischemic   Peripheral neuropathy   1. Gastroenteritis  the patient is presenting with complaints of nausea vomiting and diarrhea most likely consistent with viral gastroenteritis.  patient is not at high risk for C. Difficile.  at present he does not have any nausea at the time of my evaluation nor any abdominal pain.  initial lactic acid was significantly elevated but later on repeat  After hydration Lipase and lactic acid are negative.  Patient was accepted for observation. IV hydration,  Zofran as needed continue with Protonix.  2. Hypertension. Continuing home medications. Holding lisinopril.  3. GERD. Continuing PPI.  4.  Peripheral neuropathy. Continue gabapentin.  Advance goals of care discussion:  Full code  DVT Prophylaxis: subcutaneous Heparin Nutrition:  Regular diet advance as tolerated  Disposition: Admitted to observation in med-surge unit.  Author: Berle Mull, MD [facility] Pager: [phone] 04/03/2014, 5:24 AM    If 7PM-7AM, please contact night-coverage [email or web address] Password [email or web address]

## 2014-04-03 NOTE — Discharge Instructions (Signed)
Gastritis, Adult °Gastritis is soreness and puffiness (inflammation) of the lining of the stomach. If you do not get help, gastritis can cause bleeding and sores (ulcers) in the stomach. °HOME CARE  °· Only take medicine as told by your doctor. °· If you were given antibiotic medicines, take them as told. Finish the medicines even if you start to feel better. °· Drink enough fluids to keep your pee (urine) clear or pale yellow. °· Avoid foods and drinks that make your problems worse. Foods you may want to avoid include: °¨ Caffeine or alcohol. °¨ Chocolate. °¨ Mint. °¨ Garlic and onions. °¨ Spicy foods. °¨ Citrus fruits, including oranges, lemons, or limes. °¨ Food containing tomatoes, including sauce, chili, salsa, and pizza. °¨ Fried and fatty foods. °· Eat small meals throughout the day instead of large meals. °GET HELP RIGHT AWAY IF:  °· You have black or dark red poop (stools). °· You throw up (vomit) blood. It may look like coffee grounds. °· You cannot keep fluids down. °· Your belly (abdominal) pain gets worse. °· You have a fever. °· You do not feel better after 1 week. °· You have any other questions or concerns. °MAKE SURE YOU:  °· Understand these instructions. °· Will watch your condition. °· Will get help right away if you are not doing well or get worse. °Document Released: 08/20/2007 Document Revised: 05/26/2011 Document Reviewed: 04/16/2011 °ExitCare® Patient Information ©2015 [employer]. This information is not intended to replace advice given to you by your health care provider. Make sure you discuss any questions you have with your health care provider. ° °

## 2014-04-04 ENCOUNTER — Telehealth: Payer: Self-pay | Admitting: Family Medicine

## 2014-04-04 ENCOUNTER — Other Ambulatory Visit: Payer: Self-pay | Admitting: Family Medicine

## 2014-04-04 NOTE — Telephone Encounter (Signed)
Change to Protonix (pantoprozole) 40 mg po qd.

## 2014-04-04 NOTE — Telephone Encounter (Signed)
pls advise

## 2014-04-04 NOTE — Telephone Encounter (Signed)
I received a PA request for esomeprazole but it is a Plan Exclusion.  Preferred medications on patient's plan are as follows:  Omeprazole, pantoprazole, rabeprazole and Dexilant.

## 2014-04-05 ENCOUNTER — Ambulatory Visit (INDEPENDENT_AMBULATORY_CARE_PROVIDER_SITE_OTHER): Payer: BLUE CROSS/BLUE SHIELD | Admitting: Family Medicine

## 2014-04-05 ENCOUNTER — Encounter: Payer: Self-pay | Admitting: Family Medicine

## 2014-04-05 ENCOUNTER — Ambulatory Visit: Payer: BLUE CROSS/BLUE SHIELD | Admitting: Family Medicine

## 2014-04-05 VITALS — BP 130/70 | HR 60 | Temp 97.5°F | Wt 208.0 lb

## 2014-04-05 DIAGNOSIS — D696 Thrombocytopenia, unspecified: Secondary | ICD-10-CM

## 2014-04-05 DIAGNOSIS — R739 Hyperglycemia, unspecified: Secondary | ICD-10-CM

## 2014-04-05 DIAGNOSIS — A084 Viral intestinal infection, unspecified: Secondary | ICD-10-CM

## 2014-04-05 MED ORDER — PANTOPRAZOLE SODIUM 40 MG PO TBEC: 40.0000 mg | DELAYED_RELEASE_TABLET | Freq: Every day | ORAL | Status: DC

## 2014-04-05 NOTE — Progress Notes (Signed)
   Subjective:    Patient ID: Peter Snyder, male    DOB: 12-14-1955, 59 y.o.   MRN: [id number]  HPI  Patient seen for hospital follow-up. He developed some nausea vomiting and diarrhea and was admitted on 04/02/2014. He apparently had elevated lactic acid levels initially. His labs otherwise unremarkable. He was treated with IV fluids and antiemetics and discharged the next day. Other than some mild lethargy and decreased appetite he is doing fairly well. He had some slight nausea after discharge but no further vomiting and diarrhea has resolved at this time. He had slightly low platelet count 111,000 and slightly elevated blood sugar but was receiving IV fluids apparently with D5 most recent A1c 6.3%.  Past Medical History  Diagnosis Date  . Arthritis   . Chicken pox   . Depression   . GERD (gastroesophageal reflux disease)   . CAD (coronary artery disease) 6/14    Cardiac catheterization 6/27/ 2014 ejection fraction 35-40%, 30% proximal left circumflex, 100% tiny obtuse marginal 1 with collaterals, 50% LAD, 50% D1, 100% RCA with collaterals  . Hypertension   . Hyperlipidemia   . Kidney stone   . Colon polyps   . Diabetes mellitus     Diet controlled   Past Surgical History  Procedure Laterality Date  . Tonsillectomy  1963    reports that he has never smoked. He does not have any smokeless tobacco history on file. He reports that he does not drink alcohol or use illicit drugs. family history includes Arthritis in his father and paternal grandmother; Heart disease in his maternal grandmother; Hyperlipidemia in his maternal grandmother. Allergies  Allergen Reactions  . Sulfa Antibiotics Rash     Review of Systems  Constitutional: Negative for fever and chills.  Respiratory: Negative for shortness of breath.   Cardiovascular: Negative for chest pain.  Gastrointestinal: Negative for nausea, vomiting, abdominal pain and diarrhea.       Objective:   Physical Exam    Constitutional: He appears well-developed and well-nourished.  HENT:  Mouth/Throat: Oropharynx is clear and moist.  Cardiovascular: Normal rate and regular rhythm.   Pulmonary/Chest: Effort normal and breath sounds normal. No respiratory distress. He has no wheezes. He has no rales.  Abdominal: Soft. Bowel sounds are normal. He exhibits no distension and no mass. There is no tenderness. There is no rebound and no guarding.          Assessment & Plan:  Recent viral gastroenteritis. Improved. He had minimally low platelets which could be related to some of his chronic medications such as Nexium or Celebrex. He recently stopped Nexium and started Tagamet. Recheck CBC in one month.

## 2014-04-05 NOTE — Telephone Encounter (Signed)
Rx changed to Protonix and sent to pharmacy.

## 2014-04-05 NOTE — Progress Notes (Signed)
Pre visit review using our clinic review tool, if applicable. No additional management support is needed unless otherwise documented below in the visit note. 

## 2014-04-05 NOTE — Patient Instructions (Signed)

## 2014-04-24 ENCOUNTER — Encounter: Payer: Self-pay | Admitting: Family Medicine

## 2014-04-24 ENCOUNTER — Ambulatory Visit (INDEPENDENT_AMBULATORY_CARE_PROVIDER_SITE_OTHER): Payer: BLUE CROSS/BLUE SHIELD | Admitting: Family Medicine

## 2014-04-24 VITALS — BP 126/70 | HR 94 | Temp 98.0°F | Wt 210.0 lb

## 2014-04-24 DIAGNOSIS — K121 Other forms of stomatitis: Secondary | ICD-10-CM

## 2014-04-24 MED ORDER — FIRST-DUKES MOUTHWASH MT SUSP: 1.0000 "application " | Freq: Four times a day (QID) | OROMUCOSAL | Status: DC | PRN

## 2014-04-24 NOTE — Progress Notes (Signed)
   Subjective:    Patient ID: Peter Snyder, male    DOB: 1956-01-18, 59 y.o.   MRN: [id number]  HPI Acute visit. Patient seen with mouth ulcers. He was seen by dentist recently and placed on Valtrex. He has a couple on his tongue and inner surface of lip. None on outer surface of lip. He has not seen any improvement with Valtrex. No pain with swallowing. No fevers or chills. No recent antibiotics. No evidence for thrush recently.  Past Medical History  Diagnosis Date  . Arthritis   . Chicken pox   . Depression   . GERD (gastroesophageal reflux disease)   . CAD (coronary artery disease) 6/14    Cardiac catheterization 6/27/ 2014 ejection fraction 35-40%, 30% proximal left circumflex, 100% tiny obtuse marginal 1 with collaterals, 50% LAD, 50% D1, 100% RCA with collaterals  . Hypertension   . Hyperlipidemia   . Kidney stone   . Colon polyps   . Diabetes mellitus     Diet controlled   Past Surgical History  Procedure Laterality Date  . Tonsillectomy  1963    reports that he has never smoked. He does not have any smokeless tobacco history on file. He reports that he does not drink alcohol or use illicit drugs. family history includes Arthritis in his father and paternal grandmother; Heart disease in his maternal grandmother; Hyperlipidemia in his maternal grandmother. Allergies  Allergen Reactions  . Sulfa Antibiotics Rash      Review of Systems  Constitutional: Negative for fever, chills and fatigue.  HENT: Positive for mouth sores. Negative for trouble swallowing.   Hematological: Negative for adenopathy.       Objective:   Physical Exam  Constitutional: He appears well-developed and well-nourished.  HENT:  On the right side of the tongue is only one very small approximately 2 mm area of slight erythema. No induration. No leukoplakia. No ulceration  Cardiovascular: Normal rate and regular rhythm.   Pulmonary/Chest: Effort normal and breath sounds normal. No respiratory  distress. He has no wheezes. He has no rales.          Assessment & Plan:  Recurrent mouth ulcers by history. No evidence for aphthous ulcers currently on exam. Duke's Magic mouthwash 4 times daily as needed. Follow-up when necessary

## 2014-04-24 NOTE — Progress Notes (Signed)
Pre visit review using our clinic review tool, if applicable. No additional management support is needed unless otherwise documented below in the visit note. 

## 2014-05-05 ENCOUNTER — Ambulatory Visit (INDEPENDENT_AMBULATORY_CARE_PROVIDER_SITE_OTHER): Payer: BLUE CROSS/BLUE SHIELD | Admitting: Family Medicine

## 2014-05-05 ENCOUNTER — Encounter: Payer: Self-pay | Admitting: Family Medicine

## 2014-05-05 VITALS — BP 110/64 | HR 64 | Temp 97.8°F | Wt 209.0 lb

## 2014-05-05 DIAGNOSIS — J0100 Acute maxillary sinusitis, unspecified: Secondary | ICD-10-CM

## 2014-05-05 MED ORDER — AMOXICILLIN-POT CLAVULANATE 875-125 MG PO TABS: 1.0000 | ORAL_TABLET | Freq: Two times a day (BID) | ORAL | Status: DC

## 2014-05-05 NOTE — Progress Notes (Signed)
Pre visit review using our clinic review tool, if applicable. No additional management support is needed unless otherwise documented below in the visit note. 

## 2014-05-05 NOTE — Progress Notes (Signed)
   Subjective:    Patient ID: Peter Snyder, male    DOB: 12/08/1955, 59 y.o.   MRN: [id number]  HPI  Patient seen for acute visit. He's had long history of recurrent sinusitis. He's had a couple weeks now some progressive sinus congestion. Over the past several days had some thick yellow and blood-tinged mucus from both nares. He's had some intermittent headaches. Occasional lightheadedness. No fevers or chills. Increased malaise. He's tried nasal saline irrigation without much improvement. Denies any cough  Past Medical History  Diagnosis Date  . Arthritis   . Chicken pox   . Depression   . GERD (gastroesophageal reflux disease)   . CAD (coronary artery disease) 6/14    Cardiac catheterization 6/27/ 2014 ejection fraction 35-40%, 30% proximal left circumflex, 100% tiny obtuse marginal 1 with collaterals, 50% LAD, 50% D1, 100% RCA with collaterals  . Hypertension   . Hyperlipidemia   . Kidney stone   . Colon polyps   . Diabetes mellitus     Diet controlled   Past Surgical History  Procedure Laterality Date  . Tonsillectomy  1963    reports that he has never smoked. He does not have any smokeless tobacco history on file. He reports that he does not drink alcohol or use illicit drugs. family history includes Arthritis in his father and paternal grandmother; Heart disease in his maternal grandmother; Hyperlipidemia in his maternal grandmother. Allergies  Allergen Reactions  . Sulfa Antibiotics Rash     Review of Systems  Constitutional: Positive for fatigue.  HENT: Positive for congestion and sinus pressure.   Respiratory: Negative for cough and shortness of breath.   Neurological: Positive for headaches.       Objective:   Physical Exam  Constitutional: He appears well-developed and well-nourished.  HENT:  Right Ear: External ear normal.  Left Ear: External ear normal.  Erythematous nasal mucosa bilaterally. He has thick yellow mucus bilaterally and some of this is  blood-tinged. No visible polyps  Neck: Neck supple.  Cardiovascular: Normal rate and regular rhythm.   Pulmonary/Chest: Effort normal and breath sounds normal. No respiratory distress. He has no wheezes. He has no rales.  Lymphadenopathy:    He has no cervical adenopathy.          Assessment & Plan:  Probable recurrent bilateral maxillary sinusitis. Augmentin 875 mg twice daily for 10 days. Follow-up as needed

## 2014-05-05 NOTE — Patient Instructions (Signed)

## 2014-05-28 ENCOUNTER — Other Ambulatory Visit: Payer: Self-pay | Admitting: Family Medicine

## 2014-05-31 ENCOUNTER — Other Ambulatory Visit (INDEPENDENT_AMBULATORY_CARE_PROVIDER_SITE_OTHER): Payer: BLUE CROSS/BLUE SHIELD

## 2014-05-31 ENCOUNTER — Other Ambulatory Visit: Payer: BLUE CROSS/BLUE SHIELD

## 2014-05-31 DIAGNOSIS — D696 Thrombocytopenia, unspecified: Secondary | ICD-10-CM

## 2014-05-31 DIAGNOSIS — R739 Hyperglycemia, unspecified: Secondary | ICD-10-CM

## 2014-05-31 LAB — BASIC METABOLIC PANEL
BUN: 16 mg/dL (ref 6–23)
CHLORIDE: 104 meq/L (ref 96–112)
CO2: 33 mEq/L — ABNORMAL HIGH (ref 19–32)
Calcium: 9.4 mg/dL (ref 8.4–10.5)
Creatinine, Ser: 0.98 mg/dL (ref 0.40–1.50)
GFR: 83.19 mL/min (ref >60.00)
Glucose, Bld: 115 mg/dL — ABNORMAL HIGH (ref 70–99)
POTASSIUM: 4.2 meq/L (ref 3.5–5.1)
Sodium: 140 mEq/L (ref 135–145)

## 2014-05-31 LAB — CBC WITH DIFFERENTIAL/PLATELET
BASOS ABS: 0 10*3/uL (ref 0.0–0.1)
Basophils Relative: 0.6 % (ref 0.0–3.0)
Eosinophils Absolute: 0.1 10*3/uL (ref 0.0–0.7)
Eosinophils Relative: 2.1 % (ref 0.0–5.0)
HEMATOCRIT: 41.7 % (ref 39.0–52.0)
HEMOGLOBIN: 14.1 g/dL (ref 13.0–17.0)
Lymphocytes Relative: 29.8 % (ref 12.0–46.0)
Lymphs Abs: 1.6 10*3/uL (ref 0.7–4.0)
MCHC: 33.7 g/dL (ref 30.0–36.0)
MCV: 90.7 fl (ref 78.0–100.0)
MONO ABS: 0.4 10*3/uL (ref 0.1–1.0)
Monocytes Relative: 8.3 % (ref 3.0–12.0)
Neutro Abs: 3.1 10*3/uL (ref 1.4–7.7)
Neutrophils Relative %: 59.2 % (ref 43.0–77.0)
Platelets: 148 10*3/uL — ABNORMAL LOW (ref 150.0–400.0)
RBC: 4.6 Mil/uL (ref 4.22–5.81)
RDW: 13.1 % (ref 11.5–15.5)
WBC: 5.3 10*3/uL (ref 4.0–10.5)

## 2014-06-01 ENCOUNTER — Ambulatory Visit (INDEPENDENT_AMBULATORY_CARE_PROVIDER_SITE_OTHER): Payer: BLUE CROSS/BLUE SHIELD | Admitting: Family Medicine

## 2014-06-01 ENCOUNTER — Encounter: Payer: Self-pay | Admitting: Family Medicine

## 2014-06-01 DIAGNOSIS — R21 Rash and other nonspecific skin eruption: Secondary | ICD-10-CM

## 2014-06-01 MED ORDER — METHYLPREDNISOLONE ACETATE 80 MG/ML IJ SUSP
80.0000 mg | Freq: Once | INTRAMUSCULAR | Status: AC
  Administered 2014-06-01: 80 mg via INTRAMUSCULAR

## 2014-06-01 MED ORDER — CEPHALEXIN 500 MG PO CAPS: 500.0000 mg | ORAL_CAPSULE | Freq: Three times a day (TID) | ORAL | Status: DC

## 2014-06-01 NOTE — Progress Notes (Signed)
Pre visit review using our clinic review tool, if applicable. No additional management support is needed unless otherwise documented below in the visit note. 

## 2014-06-01 NOTE — Progress Notes (Signed)
   Subjective:    Patient ID: Peter Snyder, male    DOB: Aug 11, 1955, 59 y.o.   MRN: [id number]  HPI  Patient seen with skin rash in his scrotal area and involving his right thigh. He went to dermatologist yesterday was diagnosed with possible shingles. He was started on Valtrex. He's not seen any vesicles whatsoever in this is not following a dermatome distribution. He states he had history of intertrigo previously and has responded to corticosteroid injection and antifungal powders. He is already using antifungal powder. No incontinence issues.  Review of Systems  Constitutional: Negative for fever and chills.       Objective:   Physical Exam  Constitutional: He appears well-developed and well-nourished.  Cardiovascular: Normal rate and regular rhythm.   Skin:  Groin region is examined. He has significant erythema around the scrotal region near the midline with faint erythematous streak down the right thigh. There is no evidence whatsoever for any vesicles. No evidence for dermatome distribution. No scaly rash          Assessment & Plan:  Patient has significant inflammation scrotal region. ?intertrigo. No evidence for shingles.  He states he has done extremely well with corticosteroid injection the past with antifungal. There is some question of early cellulitis. Keflex 500 mg 3 times a day. Continue antifungal powder.

## 2014-06-05 ENCOUNTER — Other Ambulatory Visit: Payer: Self-pay | Admitting: Family Medicine

## 2014-06-05 ENCOUNTER — Other Ambulatory Visit: Payer: BLUE CROSS/BLUE SHIELD

## 2014-06-08 ENCOUNTER — Encounter: Payer: Self-pay | Admitting: Family Medicine

## 2014-06-08 ENCOUNTER — Ambulatory Visit (INDEPENDENT_AMBULATORY_CARE_PROVIDER_SITE_OTHER): Payer: BLUE CROSS/BLUE SHIELD | Admitting: Family Medicine

## 2014-06-08 VITALS — BP 126/70 | HR 88 | Temp 98.1°F | Wt 207.0 lb

## 2014-06-08 DIAGNOSIS — N62 Hypertrophy of breast: Secondary | ICD-10-CM | POA: Diagnosis not present

## 2014-06-08 NOTE — Progress Notes (Signed)
   Subjective:    Patient ID: Peter Snyder, male    DOB: 1955-08-02, 59 y.o.   MRN: [id number]  HPI Patient seen for several issues as follows:  He has some mild gynecomastia of both breast right has been slightly tender recently. No bloody discharge. No nipple inversion. No distinct masses. He denies any galactorrhea  Recent rash right thigh and scrotal area. He had been treated for possible shingles by dermatologist. We saw no evidence for shingles. He did have some erythema and mild tenderness suggesting cellulitis. Started Keflex and redness has improved. No pain at this point. No fevers or chills.  Past Medical History  Diagnosis Date  . Arthritis   . Chicken pox   . Depression   . GERD (gastroesophageal reflux disease)   . CAD (coronary artery disease) 6/14    Cardiac catheterization 6/27/ 2014 ejection fraction 35-40%, 30% proximal left circumflex, 100% tiny obtuse marginal 1 with collaterals, 50% LAD, 50% D1, 100% RCA with collaterals  . Hypertension   . Hyperlipidemia   . Kidney stone   . Colon polyps   . Diabetes mellitus     Diet controlled   Past Surgical History  Procedure Laterality Date  . Tonsillectomy  1963    reports that he has never smoked. He does not have any smokeless tobacco history on file. He reports that he does not drink alcohol or use illicit drugs. family history includes Arthritis in his father and paternal grandmother; Heart disease in his maternal grandmother; Hyperlipidemia in his maternal grandmother. Allergies  Allergen Reactions  . Sulfa Antibiotics Rash      Review of Systems  Constitutional: Negative for fever and chills.       Objective:   Physical Exam  Constitutional: He appears well-developed and well-nourished.  Cardiovascular: Normal rate and regular rhythm.   Pulmonary/Chest: Effort normal and breath sounds normal. No respiratory distress. He has no wheezes. He has no rales.  He has somewhat prominent breast tissue  bilaterally. No nipple inversion. No skin rashes involving the breast area. Minimally tender right breast. No nipple discharge. No mass  Skin:  Only faint erythema right upper thigh and groin region. Nonscaly. No pustules. No vesicles.          Assessment & Plan:  #1 gynecomastia. Suspect physiologic. No worrisome masses. Reassurance  #2 recent rash right thigh. Suspected early cellulitis. Improved on Keflex. Finish out Keflex and follow-up as needed.

## 2014-06-08 NOTE — Progress Notes (Signed)
Pre visit review using our clinic review tool, if applicable. No additional management support is needed unless otherwise documented below in the visit note. 

## 2014-06-22 ENCOUNTER — Encounter: Payer: Self-pay | Admitting: Family Medicine

## 2014-06-22 ENCOUNTER — Ambulatory Visit (INDEPENDENT_AMBULATORY_CARE_PROVIDER_SITE_OTHER): Payer: BLUE CROSS/BLUE SHIELD | Admitting: Family Medicine

## 2014-06-22 ENCOUNTER — Telehealth: Payer: Self-pay

## 2014-06-22 VITALS — BP 120/84 | HR 77 | Temp 98.3°F | Wt 203.0 lb

## 2014-06-22 DIAGNOSIS — Z23 Encounter for immunization: Secondary | ICD-10-CM | POA: Diagnosis not present

## 2014-06-22 DIAGNOSIS — R21 Rash and other nonspecific skin eruption: Secondary | ICD-10-CM | POA: Diagnosis not present

## 2014-06-22 MED ORDER — CARVEDILOL 12.5 MG PO TABS: 12.5000 mg | ORAL_TABLET | Freq: Two times a day (BID) | ORAL | Status: DC

## 2014-06-22 NOTE — Telephone Encounter (Signed)
Pt is wanting Rx for traveling. [place]

## 2014-06-22 NOTE — Telephone Encounter (Signed)
Pt states that he is traveling to [place]

## 2014-06-22 NOTE — Progress Notes (Signed)
Pre visit review using our clinic review tool, if applicable. No additional management support is needed unless otherwise documented below in the visit note. 

## 2014-06-22 NOTE — Progress Notes (Signed)
   Subjective:    Patient ID: Peter Snyder, male    DOB: 16-Apr-1955, 59 y.o.   MRN: [id number]  HPI Patient's had some persistent rash right groin region. Initially had some soreness but now basically asymptomatic and fading somewhat. He went to dermatologist recently and they suggested doing a biopsy but is not sure basically wants a second opinion. He has history toward telangiectasias specialist chest wall. They had suggested possibility things like scleroderma but he has had no evidence whatsoever to suggest systemic features such as calcinosis, Raynaud's, esophageal dysmotility, sclerodactyly.  We initially thought he may of had early cellulitis but he did not respond to antibiotics.   Review of Systems  Constitutional: Negative for fever and chills.  Skin: Positive for rash.       Objective:   Physical Exam  Constitutional: He appears well-developed and well-nourished. No distress.  Cardiovascular: Normal rate and regular rhythm.   Skin:  Patient has some very faint erythema which blanches slightly pressure right upper anterior thigh. Nonscaly. Nontender. No warmth. On magnification he seems to have some prominent telangiectasias          Assessment & Plan:  Rash right anterior thigh. This appears to be more vascular type rash. 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 He does not have any systemic features whatsoever to suggest scleroderma nor any thickening of the skin in his thigh to suggest localized scleroderma. He is considering biopsy but has upcoming trip to [place] he wishes to do first. He will consider biopsy after than if rash not improving further  Patient also travels frequently. No history of documented hepatitis A and this will be given with booster in 6 months

## 2014-06-22 NOTE — Telephone Encounter (Signed)
NO need for malaria prophylaxis for [place].

## 2014-06-22 NOTE — Telephone Encounter (Signed)
Confirm where he is traveling to.

## 2014-06-23 ENCOUNTER — Other Ambulatory Visit: Payer: Self-pay | Admitting: Family Medicine

## 2014-06-23 ENCOUNTER — Telehealth: Payer: Self-pay | Admitting: Family Medicine

## 2014-06-23 MED ORDER — CARVEDILOL 12.5 MG PO TABS: 12.5000 mg | ORAL_TABLET | Freq: Two times a day (BID) | ORAL | Status: DC

## 2014-06-23 NOTE — Telephone Encounter (Signed)
Pt said [facility] [address] does not have coreg rx # 180 w/refills

## 2014-06-23 NOTE — Telephone Encounter (Signed)
Rx sent 2x to pharmacy

## 2014-06-29 ENCOUNTER — Telehealth: Payer: Self-pay | Admitting: Cardiology

## 2014-06-29 MED ORDER — LISINOPRIL 10 MG PO TABS: 10.0000 mg | ORAL_TABLET | Freq: Every day | ORAL | Status: DC

## 2014-06-29 NOTE — Telephone Encounter (Signed)
Change lisinopril to 10 mg daily Kirk Ruths

## 2014-06-29 NOTE — Telephone Encounter (Signed)
Spoke with pt, he is going to start with the 5 mg of lisinopril daily. He will track his bp.

## 2014-06-29 NOTE — Telephone Encounter (Signed)
Please call again,he wants to know if he can start on 5 mg and work on up.His BP is 128.77. Please leave him a message.

## 2014-06-29 NOTE — Telephone Encounter (Signed)
Spoke with pt, he is leaving to go overseas on Saturday and is very anxious.  He has noticed his bp is running higher than normal. He also got a cortisone shot in his neck yesterday. His bp at present is 148/92 and 141/84. He is going to call his medical doctor to get the okay to increase his Xanax. Okay given for patient to take an extra 2.5 mg lisinopril now, Patient voiced understanding to watch for lower blood pressure from the extra medication. He wants to know if he should increase his lisinopril while he is getting ready for his trip and while he is gone. Will forward for dr Stanford Breed review

## 2014-06-29 NOTE — Telephone Encounter (Signed)
Pt c/o BP issue: STAT if pt c/o blurred vision, one-sided weakness or slurred speech  1. What are your last 5 BP readings? (before meds) 148/92 HR-103,(after meds) 152/90 HR-84 , 140/86 HR- 84  2. Are you having any other symptoms (ex. Dizziness, headache, blurred vision, passed out)? Did not specify  3. What is your BP issue? Running high, wants to adjust meds

## 2014-06-29 NOTE — Telephone Encounter (Signed)
Spoke with pt, Aware of dr crenshaw's recommendations.  °

## 2014-07-05 ENCOUNTER — Ambulatory Visit (INDEPENDENT_AMBULATORY_CARE_PROVIDER_SITE_OTHER): Payer: BLUE CROSS/BLUE SHIELD | Admitting: Family Medicine

## 2014-07-05 ENCOUNTER — Encounter: Payer: Self-pay | Admitting: Family Medicine

## 2014-07-05 VITALS — BP 138/80 | HR 60 | Temp 98.0°F | Wt 202.0 lb

## 2014-07-05 DIAGNOSIS — F418 Other specified anxiety disorders: Secondary | ICD-10-CM | POA: Diagnosis not present

## 2014-07-05 DIAGNOSIS — I255 Ischemic cardiomyopathy: Secondary | ICD-10-CM

## 2014-07-05 DIAGNOSIS — I10 Essential (primary) hypertension: Secondary | ICD-10-CM

## 2014-07-05 NOTE — Progress Notes (Signed)
Pre visit review using our clinic review tool, if applicable. No additional management support is needed unless otherwise documented below in the visit note. 

## 2014-07-05 NOTE — Progress Notes (Signed)
Subjective:    Patient ID: Peter Snyder, male    DOB: 06/16/1955, 59 y.o.   MRN: [id number]  HPI Patient has chronic problems including history of CAD, GERD, hypertension, dyslipidemia, history depression, chronic anxiety, essential tremor, ischemic cardiomyopathy. He had planned upcoming trip to [place] but has had incredible anxiety symptoms leading up to this. He's had particularly anxiety symptoms regarding whether he may have an event flying secondary to his heart. His blood pressures been up recently which he attributes to the increased anxiety. He had consult with cardiologist last Thursday and blood pressure at that point was up as high as 158/94. They increased his lisinopril to 10 mg daily. He also remains on carvedilol.  He also consulted with his psychiatrist who is in [place] and they increased his alprazolam to 0.25 mg 3 times a day. He is not sure if this has helped much. He also remains on daily Lexapro.  He is very concerned about flying with his recent poorly controlled hypertension, severe anxiety, and history of CAD. He has not had any recent unstable angina symptoms. He is specifically requesting forms be completed because of flight insurance for his trip. He is very concerned about the anxiety and poorly controlled hypertension exacerbating his underlying coronary issue.  Past Medical History  Diagnosis Date  . Arthritis   . Chicken pox   . Depression   . GERD (gastroesophageal reflux disease)   . CAD (coronary artery disease) 6/14    Cardiac catheterization 6/27/ 2014 ejection fraction 35-40%, 30% proximal left circumflex, 100% tiny obtuse marginal 1 with collaterals, 50% LAD, 50% D1, 100% RCA with collaterals  . Hypertension   . Hyperlipidemia   . Kidney stone   . Colon polyps   . Diabetes mellitus     Diet controlled   Past Surgical History  Procedure Laterality Date  . Tonsillectomy  1963    reports that he has never smoked. He does not have any smokeless  tobacco history on file. He reports that he does not drink alcohol or use illicit drugs. family history includes Arthritis in his father and paternal grandmother; Heart disease in his maternal grandmother; Hyperlipidemia in his maternal grandmother. Allergies  Allergen Reactions  . Sulfa Antibiotics Rash      Review of Systems  Constitutional: Negative for fatigue and unexpected weight change.  Eyes: Negative for visual disturbance.  Respiratory: Negative for cough, chest tightness and shortness of breath.   Cardiovascular: Negative for chest pain, palpitations and leg swelling.  Gastrointestinal: Negative for abdominal pain.  Endocrine: Negative for polydipsia and polyuria.  Neurological: Positive for dizziness and headaches. Negative for syncope, weakness and light-headedness.  Psychiatric/Behavioral: Negative for confusion. The patient is nervous/anxious.        Objective:   Physical Exam  Constitutional: He is oriented to person, place, and time. He appears well-developed and well-nourished.  HENT:  Mouth/Throat: Oropharynx is clear and moist.  Neck: Neck supple. No JVD present. No thyromegaly present.  Cardiovascular: Normal rate and regular rhythm.   Pulmonary/Chest: Effort normal and breath sounds normal. No respiratory distress. He has no wheezes. He has no rales.  Musculoskeletal: He exhibits no edema.  Neurological: He is alert and oriented to person, place, and time.  Psychiatric: He has a normal mood and affect. His behavior is normal.          Assessment & Plan:  #1 hypertension. Recent poor control. Recent increase in lisinopril to 10 mg daily. Blood pressure slightly improved today but  has been somewhat labile past few days. #2  history of chronic anxiety. He is very concerned about exacerbation of anxiety with flying. Even with recent increase in Xanax per psychiatrist he is still having significant anxiety symptoms. He is very concerned this may exacerbate his  cardiac condition. He has decided against taking his upcoming trip and we are medically in support of this.  I feel there some risk of exacerbation of CAD and hypertension issues until his anxiety is more stable. He is currently in cognitive behavioral therapy and is encouraged to continue with that as well as follow-up with his psychiatrist. #3 history of CAD with ischemic cardiomyopathy.

## 2014-07-06 ENCOUNTER — Telehealth: Payer: Self-pay | Admitting: Cardiology

## 2014-07-06 NOTE — Telephone Encounter (Signed)
Pt called in wanting to speak with Hilda Blades about the proper instructions for taking his BP medication . Please f/u with pt   Thanks

## 2014-07-06 NOTE — Telephone Encounter (Signed)
Spoke with pt, he had increased lisinopril prior to leaving on a trip. He was seen by his PCP yesterday and his bp is still elevated. He has canceled his trip and wanted to let me know the insurance company may send paperwork to me to fill out. Will watch for any paperwork.

## 2014-07-20 ENCOUNTER — Ambulatory Visit (INDEPENDENT_AMBULATORY_CARE_PROVIDER_SITE_OTHER): Payer: BLUE CROSS/BLUE SHIELD | Admitting: Family Medicine

## 2014-07-20 ENCOUNTER — Encounter: Payer: Self-pay | Admitting: Family Medicine

## 2014-07-20 VITALS — BP 128/70 | HR 65 | Temp 97.9°F | Wt 204.0 lb

## 2014-07-20 DIAGNOSIS — B9789 Other viral agents as the cause of diseases classified elsewhere: Principal | ICD-10-CM

## 2014-07-20 DIAGNOSIS — J069 Acute upper respiratory infection, unspecified: Secondary | ICD-10-CM

## 2014-07-20 MED ORDER — BENZONATATE 200 MG PO CAPS: 200.0000 mg | ORAL_CAPSULE | Freq: Three times a day (TID) | ORAL | Status: DC | PRN

## 2014-07-20 NOTE — Patient Instructions (Signed)
Upper Respiratory Infection, Adult An upper respiratory infection (URI) is also sometimes known as the common cold. The upper respiratory tract includes the nose, sinuses, throat, trachea, and bronchi. Bronchi are the airways leading to the lungs. Most people improve within 1 week, but symptoms can last up to 2 weeks. A residual cough may last even longer.  CAUSES Many different viruses can infect the tissues lining the upper respiratory tract. The tissues become irritated and inflamed and often become very moist. Mucus production is also common. A cold is contagious. You can easily spread the virus to others by oral contact. This includes kissing, sharing a glass, coughing, or sneezing. Touching your mouth or nose and then touching a surface, which is then touched by another person, can also spread the virus. SYMPTOMS  Symptoms typically develop 1 to 3 days after you come in contact with a cold virus. Symptoms vary from person to person. They may include:  Runny nose.  Sneezing.  Nasal congestion.  Sinus irritation.  Sore throat.  Loss of voice (laryngitis).  Cough.  Fatigue.  Muscle aches.  Loss of appetite.  Headache.  Low-grade fever. DIAGNOSIS  You might diagnose your own cold based on familiar symptoms, since most people get a cold 2 to 3 times a year. Your caregiver can confirm this based on your exam. Most importantly, your caregiver can check that your symptoms are not due to another disease such as strep throat, sinusitis, pneumonia, asthma, or epiglottitis. Blood tests, throat tests, and X-rays are not necessary to diagnose a common cold, but they may sometimes be helpful in excluding other more serious diseases. Your caregiver will decide if any further tests are required. RISKS AND COMPLICATIONS  You may be at risk for a more severe case of the common cold if you smoke cigarettes, have chronic heart disease (such as heart failure) or lung disease (such as asthma), or if  you have a weakened immune system. The very young and very old are also at risk for more serious infections. Bacterial sinusitis, middle ear infections, and bacterial pneumonia can complicate the common cold. The common cold can worsen asthma and chronic obstructive pulmonary disease (COPD). Sometimes, these complications can require emergency medical care and may be life-threatening. PREVENTION  The best way to protect against getting a cold is to practice good hygiene. Avoid oral or hand contact with people with cold symptoms. Wash your hands often if contact occurs. There is no clear evidence that vitamin C, vitamin E, echinacea, or exercise reduces the chance of developing a cold. However, it is always recommended to get plenty of rest and practice good nutrition. TREATMENT  Treatment is directed at relieving symptoms. There is no cure. Antibiotics are not effective, because the infection is caused by a virus, not by bacteria. Treatment may include:  Increased fluid intake. Sports drinks offer valuable electrolytes, sugars, and fluids.  Breathing heated mist or steam (vaporizer or shower).  Eating chicken soup or other clear broths, and maintaining good nutrition.  Getting plenty of rest.  Using gargles or lozenges for comfort.  Controlling fevers with ibuprofen or acetaminophen as directed by your caregiver.  Increasing usage of your inhaler if you have asthma. Zinc gel and zinc lozenges, taken in the first 24 hours of the common cold, can shorten the duration and lessen the severity of symptoms. Pain medicines may help with fever, muscle aches, and throat pain. A variety of non-prescription medicines are available to treat congestion and runny nose. Your caregiver   can make recommendations and may suggest nasal or lung inhalers for other symptoms.  HOME CARE INSTRUCTIONS   Only take over-the-counter or prescription medicines for pain, discomfort, or fever as directed by your  caregiver.  Use a warm mist humidifier or inhale steam from a shower to increase air moisture. This may keep secretions moist and make it easier to breathe.  Drink enough water and fluids to keep your urine clear or pale yellow.  Rest as needed.  Return to work when your temperature has returned to normal or as your caregiver advises. You may need to stay home longer to avoid infecting others. You can also use a face mask and careful hand washing to prevent spread of the virus. SEEK MEDICAL CARE IF:   After the first few days, you feel you are getting worse rather than better.  You need your caregiver's advice about medicines to control symptoms.  You develop chills, worsening shortness of breath, or brown or red sputum. These may be signs of pneumonia.  You develop yellow or brown nasal discharge or pain in the face, especially when you bend forward. These may be signs of sinusitis.  You develop a fever, swollen neck glands, pain with swallowing, or white areas in the back of your throat. These may be signs of strep throat. SEEK IMMEDIATE MEDICAL CARE IF:   You have a fever.  You develop severe or persistent headache, ear pain, sinus pain, or chest pain.  You develop wheezing, a prolonged cough, cough up blood, or have a change in your usual mucus (if you have chronic lung disease).  You develop sore muscles or a stiff neck. Document Released: 08/27/2000 Document Revised: 05/26/2011 Document Reviewed: 06/08/2013 Exit[employer] Patient Information 2015 [employer]. This information is not intended to replace advice given to you by your health care provider. Make sure you discuss any questions you have with your health care provider.  

## 2014-07-20 NOTE — Progress Notes (Signed)
Pre visit review using our clinic review tool, if applicable. No additional management support is needed unless otherwise documented below in the visit note. 

## 2014-07-20 NOTE — Progress Notes (Signed)
   Subjective:    Patient ID: Peter Snyder, male    DOB: 06/30/1955, 59 y.o.   MRN: [id number]  HPI Acute visit for cough and nasal congestion. Onset yesterday. He's had some mild body aches and malaise and thinks this is more viral. He takes allergy medications regular. No fevers or chills. No nausea or vomiting. He has used [employer] in the past with good success.  Past Medical History  Diagnosis Date  . Arthritis   . Chicken pox   . Depression   . GERD (gastroesophageal reflux disease)   . CAD (coronary artery disease) 6/14    Cardiac catheterization 6/27/ 2014 ejection fraction 35-40%, 30% proximal left circumflex, 100% tiny obtuse marginal 1 with collaterals, 50% LAD, 50% D1, 100% RCA with collaterals  . Hypertension   . Hyperlipidemia   . Kidney stone   . Colon polyps   . Diabetes mellitus     Diet controlled   Past Surgical History  Procedure Laterality Date  . Tonsillectomy  1963    reports that he has never smoked. He does not have any smokeless tobacco history on file. He reports that he does not drink alcohol or use illicit drugs. family history includes Arthritis in his father and paternal grandmother; Heart disease in his maternal grandmother; Hyperlipidemia in his maternal grandmother. Allergies  Allergen Reactions  . Sulfa Antibiotics Rash      Review of Systems  Constitutional: Positive for fatigue. Negative for fever and chills.  HENT: Positive for congestion. Negative for sore throat.   Respiratory: Positive for cough.        Objective:   Physical Exam  Constitutional: He appears well-developed and well-nourished. No distress.  HENT:  Right Ear: External ear normal.  Left Ear: External ear normal.  Mouth/Throat: Oropharynx is clear and moist.  Neck: Neck supple.  Cardiovascular: Normal rate and regular rhythm.   Pulmonary/Chest: Effort normal and breath sounds normal. No respiratory distress. He has no wheezes. He has no rales.    Lymphadenopathy:    He has no cervical adenopathy.          Assessment & Plan:  Viral URI. Tessalon Perles 200 mg every hours as needed for cough. Follow-up when necessary

## 2014-08-02 ENCOUNTER — Ambulatory Visit (INDEPENDENT_AMBULATORY_CARE_PROVIDER_SITE_OTHER): Payer: BLUE CROSS/BLUE SHIELD | Admitting: Family Medicine

## 2014-08-02 ENCOUNTER — Encounter: Payer: Self-pay | Admitting: Family Medicine

## 2014-08-02 VITALS — BP 120/68 | HR 70 | Temp 98.3°F | Wt 207.0 lb

## 2014-08-02 DIAGNOSIS — I10 Essential (primary) hypertension: Secondary | ICD-10-CM | POA: Diagnosis not present

## 2014-08-02 NOTE — Progress Notes (Signed)
   Subjective:    Patient ID: Peter Snyder, male    DOB: 03/15/1956, 59 y.o.   MRN: [id number]  HPI  Patient here requesting check of hypertension. He takes carvedilol and lisinopril. He had recent blood pressure 102/60 and was concerned this may be too low. He did not fill any dizziness though whatsoever. He is still walking up to 2-3 miles per day with no difficulties. No chest pains. Hydrating well.  Past Medical History  Diagnosis Date  . Arthritis   . Chicken pox   . Depression   . GERD (gastroesophageal reflux disease)   . CAD (coronary artery disease) 6/14    Cardiac catheterization 6/27/ 2014 ejection fraction 35-40%, 30% proximal left circumflex, 100% tiny obtuse marginal 1 with collaterals, 50% LAD, 50% D1, 100% RCA with collaterals  . Hypertension   . Hyperlipidemia   . Kidney stone   . Colon polyps   . Diabetes mellitus     Diet controlled   Past Surgical History  Procedure Laterality Date  . Tonsillectomy  1963    reports that he has never smoked. He does not have any smokeless tobacco history on file. He reports that he does not drink alcohol or use illicit drugs. family history includes Arthritis in his father and paternal grandmother; Heart disease in his maternal grandmother; Hyperlipidemia in his maternal grandmother. Allergies  Allergen Reactions  . Sulfa Antibiotics Rash     Review of Systems  Constitutional: Negative for fatigue.  Eyes: Negative for visual disturbance.  Respiratory: Negative for cough, chest tightness and shortness of breath.   Cardiovascular: Negative for chest pain, palpitations and leg swelling.  Endocrine: Negative for polydipsia and polyuria.  Neurological: Negative for dizziness, syncope, weakness, light-headedness and headaches.       Objective:   Physical Exam  Constitutional: He appears well-developed and well-nourished.  Cardiovascular: Normal rate and regular rhythm.   Pulmonary/Chest: Effort normal and breath sounds  normal. No respiratory distress. He has no wheezes. He has no rales.  Musculoskeletal: He exhibits no edema.          Assessment & Plan:  Hypertension. Stable and at goal. Blood pressure left arm seated 115/60 and standing 114/60. Continue current medications. Routine follow-up 6 months

## 2014-08-02 NOTE — Progress Notes (Signed)
Pre visit review using our clinic review tool, if applicable. No additional management support is needed unless otherwise documented below in the visit note. 

## 2014-08-10 ENCOUNTER — Telehealth: Payer: Self-pay | Admitting: Family Medicine

## 2014-08-10 MED ORDER — FIRST-DUKES MOUTHWASH MT SUSP: 1.0000 "application " | Freq: Four times a day (QID) | OROMUCOSAL | Status: DC | PRN

## 2014-08-10 NOTE — Telephone Encounter (Signed)
Rx called in to pharmacy. 

## 2014-08-10 NOTE — Telephone Encounter (Signed)
OK 

## 2014-08-10 NOTE — Telephone Encounter (Signed)
UCLERS in his mouth and is asking if Dr Elease Hashimoto will call in [place]

## 2014-08-11 ENCOUNTER — Telehealth: Payer: Self-pay

## 2014-08-11 NOTE — Telephone Encounter (Signed)
Pharmacist called to inform that [facility] mouthwash is on back order. Would it be okay to give the patient [facility] (Benadryl, Lidocaine, Maalox). Per Dr. B it is okay to change.

## 2014-08-16 ENCOUNTER — Ambulatory Visit (INDEPENDENT_AMBULATORY_CARE_PROVIDER_SITE_OTHER): Payer: BLUE CROSS/BLUE SHIELD | Admitting: Family Medicine

## 2014-08-16 DIAGNOSIS — R300 Dysuria: Secondary | ICD-10-CM | POA: Diagnosis not present

## 2014-08-16 LAB — POCT URINALYSIS DIPSTICK
Bilirubin, UA: NEGATIVE
Glucose, UA: NEGATIVE
KETONES UA: NEGATIVE
Leukocytes, UA: NEGATIVE
Nitrite, UA: NEGATIVE
Protein, UA: NEGATIVE
RBC UA: NEGATIVE
Spec Grav, UA: 1.01
Urobilinogen, UA: 0.2
pH, UA: 7

## 2014-08-16 NOTE — Patient Instructions (Signed)
Follow up for any fever or persistent urinary symptoms.

## 2014-08-16 NOTE — Progress Notes (Signed)
   Subjective:    Patient ID: Peter Snyder, male    DOB: December 13, 1955, 59 y.o.   MRN: [id number]  HPI Burning with urination yesterday and symptoms actually improved today. No hematuria. He does have prior history of kidney stones. Has history of BPH and takes Flomax. No fevers or chills. No flank pain.   Review of Systems  Constitutional: Negative for fever and chills.  Gastrointestinal: Negative for abdominal pain.  Genitourinary: Positive for frequency. Negative for hematuria.       Objective:   Physical Exam  Constitutional: He appears well-developed and well-nourished. No distress.  Cardiovascular: Normal rate and regular rhythm.   Pulmonary/Chest: Effort normal and breath sounds normal. No respiratory distress. He has no wheezes. He has no rales.  Genitourinary: Rectum normal and prostate normal.          Assessment & Plan:  Dysuria. Symptoms were transient and improved today. Urine dipstick completely normal. Clinically, exam does not suggest acute prostatitis. Reassurance and observe for now.

## 2014-08-18 ENCOUNTER — Ambulatory Visit (INDEPENDENT_AMBULATORY_CARE_PROVIDER_SITE_OTHER): Payer: BLUE CROSS/BLUE SHIELD | Admitting: Family Medicine

## 2014-08-18 ENCOUNTER — Encounter: Payer: Self-pay | Admitting: Family Medicine

## 2014-08-18 VITALS — BP 120/70 | HR 66 | Temp 97.8°F | Wt 204.0 lb

## 2014-08-18 DIAGNOSIS — R300 Dysuria: Secondary | ICD-10-CM | POA: Diagnosis not present

## 2014-08-18 DIAGNOSIS — R0981 Nasal congestion: Secondary | ICD-10-CM

## 2014-08-18 NOTE — Progress Notes (Signed)
Pre visit review using our clinic review tool, if applicable. No additional management support is needed unless otherwise documented below in the visit note. 

## 2014-08-18 NOTE — Patient Instructions (Signed)
Consider over-the-counter Uristat for urinary tract irritation Continue to drink plenty of fluids Follow-up promptly for any fever, gross blood in urine, or worsening symptoms

## 2014-08-18 NOTE — Progress Notes (Signed)
   Subjective:    Patient ID: Peter Snyder, male    DOB: [birth year], 59 y.o.   MRN: [id number]  HPI  Patient seen with some occasional persistent burning with urination. Was just seen 2 days ago had completely normal urine dipstick. He's not had any fevers or chills. No penile discharge. Monogamous and no history of STD. He does have history of kidney stones. Is not aware passing any recent stones. He recalls several years ago taking medication called Urised which seemed to help with similar symptoms. Recent prostate exam 2 days ago unremarkable. He does have BPH which is controlled with Flomax. Occasional slow stream but usually no difficulty emptying bladder   Second issue is sinus congestive symptoms over the past few days. He had nosebleed from the right side last night on one occasion. No recent purulent secretions. No headaches. No localized facial pain. No upper teeth pain.  Past Medical History  Diagnosis Date  . Arthritis   . Chicken pox   . Depression   . GERD (gastroesophageal reflux disease)   . CAD (coronary artery disease) 6/14    Cardiac catheterization 6/27/ 2014 ejection fraction 35-40%, 30% proximal left circumflex, 100% tiny obtuse marginal 1 with collaterals, 50% LAD, 50% D1, 100% RCA with collaterals  . Hypertension   . Hyperlipidemia   . Kidney stone   . Colon polyps   . Diabetes mellitus     Diet controlled   Past Surgical History  Procedure Laterality Date  . Tonsillectomy  1963    reports that he has never smoked. He does not have any smokeless tobacco history on file. He reports that he does not drink alcohol or use illicit drugs. family history includes Arthritis in his father and paternal grandmother; Heart disease in his maternal grandmother; Hyperlipidemia in his maternal grandmother. Allergies  Allergen Reactions  . Sulfa Antibiotics Rash      Review of Systems  Constitutional: Negative for fever and chills.  HENT: Positive for congestion.     Gastrointestinal: Negative for abdominal pain.  Genitourinary: Positive for dysuria. Negative for urgency, hematuria and flank pain.       Objective:   Physical Exam  Constitutional: He appears well-developed and well-nourished.  HENT:  Right Ear: External ear normal.  Left Ear: External ear normal.  Moderate erythema right naris. No active bleeding. No blood clots noted.  Neck: Neck supple.  Cardiovascular: Normal rate and regular rhythm.   Pulmonary/Chest: Effort normal and breath sounds normal. No respiratory distress. He has no wheezes. He has no rales.  Lymphadenopathy:    He has no cervical adenopathy.          Assessment & Plan:  #1 dysuria. Urine dipstick just 2 days ago normal. No specific risk factors for urethra-itis. We discussed things like chlamydia and GC testing and he declines. Trial of over-the-counter Uristat for urinary irritation and consider urology referral not resolving over the next several days. #2 nasal congestion. One episode of nosebleed right naris yesterday but no active bleeding at this time. Observe for now.

## 2014-08-22 ENCOUNTER — Encounter: Payer: Self-pay | Admitting: Family Medicine

## 2014-08-22 ENCOUNTER — Ambulatory Visit (INDEPENDENT_AMBULATORY_CARE_PROVIDER_SITE_OTHER): Payer: BLUE CROSS/BLUE SHIELD | Admitting: Family Medicine

## 2014-08-22 VITALS — BP 120/80 | HR 85 | Temp 98.6°F | Wt 204.0 lb

## 2014-08-22 DIAGNOSIS — M79621 Pain in right upper arm: Secondary | ICD-10-CM

## 2014-08-22 DIAGNOSIS — M25511 Pain in right shoulder: Secondary | ICD-10-CM | POA: Diagnosis not present

## 2014-08-22 DIAGNOSIS — R300 Dysuria: Secondary | ICD-10-CM

## 2014-08-22 LAB — POCT URINALYSIS DIPSTICK
BILIRUBIN UA: NEGATIVE
Blood, UA: NEGATIVE
Glucose, UA: NEGATIVE
Ketones, UA: NEGATIVE
LEUKOCYTES UA: NEGATIVE
NITRITE UA: NEGATIVE
PH UA: 6
Protein, UA: NEGATIVE
Spec Grav, UA: <=1.005
Urobilinogen, UA: 0.2

## 2014-08-22 NOTE — Progress Notes (Signed)
   Subjective:    Patient ID: Peter Snyder, male    DOB: 23-May-1955, 59 y.o.   MRN: [id number]  HPI  Patient seen with right axillary pain. Noted a few days ago. He went to urgent care yesterday and was prescribed Keflex. He denied any erythema or warmth. No recent injury. Pain is less today. He's not had any recent fevers or chills.  Recent dysuria with completely normal urinalysis. He took some over-the-counter Uristat which seemed to help symptoms. His symptoms then recur this morning. No difficulty urinating or emptying bladder. He does have history of BPH and takes Flomax for that. No fevers. No gross hematuria  Past Medical History  Diagnosis Date  . Arthritis   . Chicken pox   . Depression   . GERD (gastroesophageal reflux disease)   . CAD (coronary artery disease) 6/14    Cardiac catheterization 6/27/ 2014 ejection fraction 35-40%, 30% proximal left circumflex, 100% tiny obtuse marginal 1 with collaterals, 50% LAD, 50% D1, 100% RCA with collaterals  . Hypertension   . Hyperlipidemia   . Kidney stone   . Colon polyps   . Diabetes mellitus     Diet controlled   Past Surgical History  Procedure Laterality Date  . Tonsillectomy  1963    reports that he has never smoked. He does not have any smokeless tobacco history on file. He reports that he does not drink alcohol or use illicit drugs. family history includes Arthritis in his father and paternal grandmother; Heart disease in his maternal grandmother; Hyperlipidemia in his maternal grandmother. Allergies  Allergen Reactions  . Sulfa Antibiotics Rash    Review of Systems  Constitutional: Negative for fever and chills.  Respiratory: Negative for shortness of breath.   Cardiovascular: Negative for chest pain.  Genitourinary: Positive for dysuria.       Objective:   Physical Exam  Constitutional: He appears well-developed and well-nourished.  Neck: Neck supple.  Cardiovascular: Normal rate and regular rhythm.     Pulmonary/Chest: Effort normal and breath sounds normal. No respiratory distress. He has no wheezes. He has no rales.  Right axilla reveals no overlying erythema or warmth. No adenopathy. No glandular swelling  Lymphadenopathy:    He has no cervical adenopathy.          Assessment & Plan:  Right axillary pain. No evidence for infection. We cannot appreciate any lymphadenopathy or hidradenitis changes . He will finish out Keflex which is already started elsewhere. Follow-up for any persistent pain, swelling, or redness.  Mild recurrent dysuria. Urinalysis today is again completely normal. Reassurance. Urology referral for further evaluation for any recurrent/persistent symptoms

## 2014-08-22 NOTE — Patient Instructions (Signed)
Follow up for any axillary redness, persistent pain, or increased swelling.

## 2014-08-22 NOTE — Progress Notes (Signed)
Pre visit review using our clinic review tool, if applicable. No additional management support is needed unless otherwise documented below in the visit note. 

## 2014-08-25 ENCOUNTER — Other Ambulatory Visit: Payer: Self-pay | Admitting: Family Medicine

## 2014-09-06 ENCOUNTER — Ambulatory Visit (INDEPENDENT_AMBULATORY_CARE_PROVIDER_SITE_OTHER): Payer: BLUE CROSS/BLUE SHIELD | Admitting: Family Medicine

## 2014-09-06 ENCOUNTER — Encounter: Payer: Self-pay | Admitting: Family Medicine

## 2014-09-06 VITALS — BP 120/76 | HR 64 | Temp 98.1°F | Wt 204.0 lb

## 2014-09-06 DIAGNOSIS — S50312A Abrasion of left elbow, initial encounter: Secondary | ICD-10-CM

## 2014-09-06 NOTE — Progress Notes (Signed)
   Subjective:    Patient ID: Peter Snyder, male    DOB: 1956/01/06, 59 y.o.   MRN: [id number]  HPI Here for evaluation wound left elbow. Just noticed a few days ago over the weekend. He does not recall specific injury. Minimal drainage. Nontender. No fevers or chills. He's been applying some topical anabiotic cream  Past Medical History  Diagnosis Date  . Arthritis   . Chicken pox   . Depression   . GERD (gastroesophageal reflux disease)   . CAD (coronary artery disease) 6/14    Cardiac catheterization 6/27/ 2014 ejection fraction 35-40%, 30% proximal left circumflex, 100% tiny obtuse marginal 1 with collaterals, 50% LAD, 50% D1, 100% RCA with collaterals  . Hypertension   . Hyperlipidemia   . Kidney stone   . Colon polyps   . Diabetes mellitus     Diet controlled   Past Surgical History  Procedure Laterality Date  . Tonsillectomy  1963    reports that he has never smoked. He does not have any smokeless tobacco history on file. He reports that he does not drink alcohol or use illicit drugs. family history includes Arthritis in his father and paternal grandmother; Heart disease in his maternal grandmother; Hyperlipidemia in his maternal grandmother. Allergies  Allergen Reactions  . Sulfa Antibiotics Rash      Review of Systems  Constitutional: Negative for fever, chills, appetite change and unexpected weight change.       Objective:   Physical Exam  Constitutional: He appears well-developed and well-nourished. No distress.  Cardiovascular: Normal rate and regular rhythm.   Pulmonary/Chest: Effort normal and breath sounds normal. No respiratory distress. He has no wheezes. He has no rales.  Skin:  Left elbow small abrasion. No surrounding cellulitis changes. No evidence for olecranon bursitis. Nontender. No mass          Assessment & Plan:  Probable small abrasion left elbow. No secondary infection. Good wound care discussed. Continue topical anabiotic and touch  base if this is not fully healing in a couple weeks

## 2014-09-06 NOTE — Progress Notes (Signed)
Pre visit review using our clinic review tool, if applicable. No additional management support is needed unless otherwise documented below in the visit note. 

## 2014-09-06 NOTE — Patient Instructions (Signed)
Potassium Content of Foods  Potassium is a mineral found in many foods and drinks. It helps keep fluids and minerals balanced in your body and affects how steadily your heart beats. Potassium also helps control your blood pressure and keep your muscles and nervous system healthy.  Certain health conditions and medicines may change the balance of potassium in your body. When this happens, you can help balance your level of potassium through the foods that you do or do not eat. Your health care provider or dietitian may recommend an amount of potassium that you should have each day. The following lists of foods provide the amount of potassium (in parentheses) per serving in each item.  HIGH IN POTASSIUM   The following foods and beverages have 200 mg or more of potassium per serving:  · Apricots, 2 raw or 5 dry (200 mg).  · Artichoke, 1 medium (345 mg).  · Avocado, raw,  ¼ each (245 mg).  · Banana, 1 medium (425 mg).  · Beans, lima, or baked beans, canned, ½ cup (280 mg).  · Beans, white, canned, ½ cup (595 mg).  · Beef roast, 3 oz (320 mg).  · Beef, ground, 3 oz (270 mg).  · Beets, raw or cooked, ½ cup (260 mg).  · Bran muffin, 2 oz (300 mg).  · Broccoli, ½ cup (230 mg).  · Brussels sprouts, ½ cup (250 mg).  · Cantaloupe, ½ cup (215 mg).  · Cereal, 100% bran, ½ cup (200-400 mg).  · Cheeseburger, single, fast food, 1 each (225-400 mg).  · Chicken, 3 oz (220 mg).  · Clams, canned, 3 oz (535 mg).  · Crab, 3 oz (225 mg).  · Dates, 5 each (270 mg).  · Dried beans and peas, ½ cup (300-475 mg).  · Figs, dried, 2 each (260 mg).  · Fish: halibut, tuna, cod, snapper, 3 oz (480 mg).  · Fish: salmon, haddock, swordfish, perch, 3 oz (300 mg).  · Fish, tuna, canned 3 oz (200 mg).  · French fries, fast food, 3 oz (470 mg).  · Granola with fruit and nuts, ½ cup (200 mg).  · Grapefruit juice, ½ cup (200 mg).  · Greens, beet, ½ cup (655 mg).  · Honeydew melon, ½ cup (200 mg).  · Kale, raw, 1 cup (300 mg).  · Kiwi, 1 medium (240  mg).  · Kohlrabi, rutabaga, parsnips, ½ cup (280 mg).  · Lentils, ½ cup (365 mg).  · Mango, 1 each (325 mg).  · Milk, chocolate, 1 cup (420 mg).  · Milk: nonfat, low-fat, whole, buttermilk, 1 cup (350-380 mg).  · Molasses, 1 Tbsp (295 mg).  · Mushrooms, ½ cup (280) mg.  · Nectarine, 1 each (275 mg).  · Nuts: almonds, peanuts, hazelnuts, [place], cashew, mixed, 1 oz (200 mg).  · Nuts, pistachios, 1 oz (295 mg).  · Orange, 1 each (240 mg).  · Orange juice, ½ cup (235 mg).  · Papaya, medium, ½ fruit (390 mg).  · Peanut butter, chunky, 2 Tbsp (240 mg).  · Peanut butter, smooth, 2 Tbsp (210 mg).  · Pear, 1 medium (200 mg).  · Pomegranate, 1 whole (400 mg).  · Pomegranate juice, ½ cup (215 mg).  · Pork, 3 oz (350 mg).  · Potato chips, salted, 1 oz (465 mg).  · Potato, baked with skin, 1 medium (925 mg).  · Potatoes, boiled, ½ cup (255 mg).  · Potatoes, mashed, ½ cup (330 mg).  · Prune juice, ½ cup (  370 mg).  · Prunes, 5 each (305 mg).  · Pudding, chocolate, ½ cup (230 mg).  · Pumpkin, canned, ½ cup (250 mg).  · Raisins, seedless, ¼ cup (270 mg).  · Seeds, sunflower or pumpkin, 1 oz (240 mg).  · Soy milk, 1 cup (300 mg).  · Spinach, ½ cup (420 mg).  · Spinach, canned, ½ cup (370 mg).  · Sweet potato, baked with skin, 1 medium (450 mg).  · Swiss chard, ½ cup (480 mg).  · Tomato or vegetable juice, ½ cup (275 mg).  · Tomato sauce or puree, ½ cup (400-550 mg).  · Tomato, raw, 1 medium (290 mg).  · Tomatoes, canned, ½ cup (200-300 mg).  · Turkey, 3 oz (250 mg).  · Wheat germ, 1 oz (250 mg).  · Winter squash, ½ cup (250 mg).  · Yogurt, plain or fruited, 6 oz (260-435 mg).  · Zucchini, ½ cup (220 mg).  MODERATE IN POTASSIUM  The following foods and beverages have 50-200 mg of potassium per serving:  · Apple, 1 each (150 mg).  · Apple juice, ½ cup (150 mg).  · Applesauce, ½ cup (90 mg).  · Apricot nectar, ½ cup (140 mg).  · Asparagus, small spears, ½ cup or 6 spears (155 mg).  · Bagel, cinnamon raisin, 1 each (130 mg).  · Bagel,  egg or plain, 4 in., 1 each (70 mg).  · Beans, green, ½ cup (90 mg).  · Beans, yellow, ½ cup (190 mg).  · Beer, regular, 12 oz (100 mg).  · Beets, canned, ½ cup (125 mg).  · Blackberries, ½ cup (115 mg).  · Blueberries, ½ cup (60 mg).  · Bread, whole wheat, 1 slice (70 mg).  · Broccoli, raw, ½ cup (145 mg).  · Cabbage, ½ cup (150 mg).  · Carrots, cooked or raw, ½ cup (180 mg).  · Cauliflower, raw, ½ cup (150 mg).  · Celery, raw, ½ cup (155 mg).  · Cereal, bran flakes, ½cup (120-150 mg).  · Cheese, cottage, ½ cup (110 mg).  · Cherries, 10 each (150 mg).  · Chocolate, 1½ oz bar (165 mg).  · Coffee, brewed 6 oz (90 mg).  · Corn, ½ cup or 1 ear (195 mg).  · Cucumbers, ½ cup (80 mg).  · Egg, large, 1 each (60 mg).  · Eggplant, ½ cup (60 mg).  · Endive, raw, ½cup (80 mg).  · English muffin, 1 each (65 mg).  · Fish, orange roughy, 3 oz (150 mg).  · Frankfurter, beef or pork, 1 each (75 mg).  · Fruit cocktail, ½ cup (115 mg).  · Grape juice, ½ cup (170 mg).  · Grapefruit, ½ fruit (175 mg).  · Grapes, ½ cup (155 mg).  · Greens: kale, turnip, collard, ½ cup (110-150 mg).  · Ice cream or frozen yogurt, chocolate, ½ cup (175 mg).  · Ice cream or frozen yogurt, vanilla, ½ cup (120-150 mg).  · Lemons, limes, 1 each (80 mg).  · Lettuce, all types, 1 cup (100 mg).  · Mixed vegetables, ½ cup (150 mg).  · Mushrooms, raw, ½ cup (110 mg).  · Nuts: walnuts, pecans, or macadamia, 1 oz (125 mg).  · Oatmeal, ½ cup (80 mg).  · Okra, ½ cup (110 mg).  · Onions, raw, ½ cup (120 mg).  · Peach, 1 each (185 mg).  · Peaches, canned, ½ cup (120 mg).  · Pears, canned, ½ cup (120 mg).  · Peas, green,   frozen, ½ cup (90 mg).  · Peppers, green, ½ cup (130 mg).  · Peppers, red, ½ cup (160 mg).  · Pineapple juice, ½ cup (165 mg).  · Pineapple, fresh or canned, ½ cup (100 mg).  · Plums, 1 each (105 mg).  · Pudding, vanilla, ½ cup (150 mg).  · Raspberries, ½ cup (90 mg).  · Rhubarb, ½ cup (115 mg).  · Rice, wild, ½ cup (80 mg).  · Shrimp, 3 oz (155  mg).  · Spinach, raw, 1 cup (170 mg).  · Strawberries, ½ cup (125 mg).  · Summer squash ½ cup (175-200 mg).  · Swiss chard, raw, 1 cup (135 mg).  · Tangerines, 1 each (140 mg).  · Tea, brewed, 6 oz (65 mg).  · Turnips, ½ cup (140 mg).  · Watermelon, ½ cup (85 mg).  · Wine, red, table, 5 oz (180 mg).  · Wine, white, table, 5 oz (100 mg).  LOW IN POTASSIUM  The following foods and beverages have less than 50 mg of potassium per serving.  · Bread, white, 1 slice (30 mg).  · Carbonated beverages, 12 oz (less than 5 mg).  · Cheese, 1 oz (20-30 mg).  · Cranberries, ½ cup (45 mg).  · Cranberry juice cocktail, ½ cup (20 mg).  · Fats and oils, 1 Tbsp (less than 5 mg).  · Hummus, 1 Tbsp (32 mg).  · Nectar: papaya, mango, or pear, ½ cup (35 mg).  · Rice, white or brown, ½ cup (50 mg).  · Spaghetti or macaroni, ½ cup cooked (30 mg).  · Tortilla, flour or corn, 1 each (50 mg).  · Waffle, 4 in., 1 each (50 mg).  · Water chestnuts, ½ cup (40 mg).  Document Released: 10/15/2004 Document Revised: 03/08/2013 Document Reviewed: 01/28/2013  ExitCare® Patient Information ©2015 [employer]. This information is not intended to replace advice given to you by your health care provider. Make sure you discuss any questions you have with your health care provider.

## 2014-09-23 ENCOUNTER — Other Ambulatory Visit: Payer: Self-pay | Admitting: Family Medicine

## 2014-09-25 ENCOUNTER — Ambulatory Visit (INDEPENDENT_AMBULATORY_CARE_PROVIDER_SITE_OTHER): Payer: BLUE CROSS/BLUE SHIELD | Admitting: Family Medicine

## 2014-09-25 ENCOUNTER — Encounter: Payer: Self-pay | Admitting: Family Medicine

## 2014-09-25 ENCOUNTER — Telehealth: Payer: Self-pay | Admitting: Family Medicine

## 2014-09-25 VITALS — BP 128/80 | HR 64 | Temp 97.5°F | Wt 205.0 lb

## 2014-09-25 DIAGNOSIS — L304 Erythema intertrigo: Secondary | ICD-10-CM

## 2014-09-25 DIAGNOSIS — R42 Dizziness and giddiness: Secondary | ICD-10-CM

## 2014-09-25 MED ORDER — METHYLPREDNISOLONE ACETATE 80 MG/ML IJ SUSP
80.0000 mg | Freq: Once | INTRAMUSCULAR | Status: AC
  Administered 2014-09-25: 80 mg via INTRAMUSCULAR

## 2014-09-25 MED ORDER — PANTOPRAZOLE SODIUM 40 MG PO TBEC: 40.0000 mg | DELAYED_RELEASE_TABLET | Freq: Every day | ORAL | Status: DC

## 2014-09-25 MED ORDER — CLOTRIMAZOLE-BETAMETHASONE 1-0.05 % EX CREA: 1.0000 "application " | TOPICAL_CREAM | Freq: Two times a day (BID) | CUTANEOUS | Status: DC

## 2014-09-25 NOTE — Addendum Note (Signed)
Addended by: Marcina Millard on: 09/25/2014 03:16 PM   Modules accepted: Orders

## 2014-09-25 NOTE — Patient Instructions (Signed)
Intertrigo Intertrigo is a skin condition that occurs in between folds of skin in places on the body that rub together a lot and do not get much ventilation. It is caused by heat, moisture, friction, sweat retention, and lack of air circulation, which produces red, irritated patches and, sometimes, scaling or drainage. People who have diabetes, who are obese, or who have treatment with antibiotics are at increased risk for intertrigo. The most common sites for intertrigo to occur include:  The groin.  The breasts.  The armpits.  Folds of abdominal skin.  Webbed spaces between the fingers or toes. Intertrigo may be aggravated by:  Sweat.  Feces.  Yeast or bacteria that are present near skin folds.  Urine.  Vaginal discharge. HOME CARE INSTRUCTIONS  The following steps can be taken to reduce friction and keep the affected area cool and dry:  Expose skin folds to the air.  Keep deep skin folds separated with cotton or linen cloth. Avoid tight fitting clothing that could cause chafing.  Wear open-toed shoes or sandals to help reduce moisture between the toes.  Apply absorbent powders to affected areas as directed by your caregiver.  Apply over-the-counter barrier pastes, such as zinc oxide, as directed by your caregiver.  If you develop a fungal infection in the affected area, your caregiver may have you use antifungal creams. SEEK MEDICAL CARE IF:   The rash is not improving after 1 week of treatment.  The rash is getting worse (more red, more swollen, more painful, or spreading).  You have a fever or chills. MAKE SURE YOU:   Understand these instructions.  Will watch your condition.  Will get help right away if you are not doing well or get worse. Document Released: 03/03/2005 Document Revised: 05/26/2011 Document Reviewed: 08/16/2009 ExitCare Patient Information 2015 [employer]. This information is not intended to replace advice given to you by your health  care provider. Make sure you discuss any questions you have with your health care provider.  

## 2014-09-25 NOTE — Telephone Encounter (Signed)
Pt states that with the medicine the intertrigo has gotten worse. Pt is in a lot of discomfort, and would like to get a Cortisone shot as discussed with MD earlier this morning. Please advise

## 2014-09-25 NOTE — Progress Notes (Signed)
Pre visit review using our clinic review tool, if applicable. No additional management support is needed unless otherwise documented below in the visit note. 

## 2014-09-25 NOTE — Addendum Note (Signed)
Addended by: Marcina Millard on: 09/25/2014 02:47 PM   Modules accepted: Orders

## 2014-09-25 NOTE — Progress Notes (Signed)
   Subjective:    Patient ID: Peter Snyder, male    DOB: 1955/10/10, 59 y.o.   MRN: [id number]  HPI  Seen for the following issues  Last week after walking he had some lightheadedness. This was not while exercising but later in the day after sitting and first getting up. Symptoms were very transient. No syncope. Generally staying well-hydrated. Denies any vertigo symptoms. No chest pains. No dyspnea. He takes Coreg and lisinopril.  Second issue is very inflamed pruritic and slightly irritated rash around his perianal region. He's had similar intertrigo type process in the past. He tried some type of topical anti-fungal without much improvement. Exacerbated by heat. No alleviating factors.  Past Medical History  Diagnosis Date  . Arthritis   . Chicken pox   . Depression   . GERD (gastroesophageal reflux disease)   . CAD (coronary artery disease) 6/14    Cardiac catheterization 6/27/ 2014 ejection fraction 35-40%, 30% proximal left circumflex, 100% tiny obtuse marginal 1 with collaterals, 50% LAD, 50% D1, 100% RCA with collaterals  . Hypertension   . Hyperlipidemia   . Kidney stone   . Colon polyps   . Diabetes mellitus     Diet controlled   Past Surgical History  Procedure Laterality Date  . Tonsillectomy  1963    reports that he has never smoked. He does not have any smokeless tobacco history on file. He reports that he does not drink alcohol or use illicit drugs. family history includes Arthritis in his father and paternal grandmother; Heart disease in his maternal grandmother; Hyperlipidemia in his maternal grandmother. Allergies  Allergen Reactions  . Sulfa Antibiotics Rash     Review of Systems  Constitutional: Negative for fever, chills and unexpected weight change.  Respiratory: Negative for shortness of breath.   Cardiovascular: Negative for chest pain.  Genitourinary: Negative for dysuria.  Skin: Positive for rash.  Neurological: Positive for dizziness and  light-headedness. Negative for syncope, weakness and headaches.       Objective:   Physical Exam  Constitutional: He is oriented to person, place, and time. He appears well-developed and well-nourished.  Cardiovascular: Normal rate and regular rhythm.  Exam reveals no gallop.   Pulmonary/Chest: Effort normal and breath sounds normal. No respiratory distress. He has no wheezes. He has no rales.  Musculoskeletal: He exhibits no edema.  Neurological: He is alert and oriented to person, place, and time. No cranial nerve deficit.  Skin: Rash noted.  He has fairly dark erythematous nonscaly rash around the perianal region          Assessment & Plan:  #1 lightheadedness. Blood pressure today sitting 128/70 and standing 118/68. We have not recommended any change in blood pressure medications. Stay well-hydrated. #2 intertrigo type rash perianal region. Lotrisone cream twice daily. Keep area dry as possible. Loose fitting undergarments.  Touch base in one week if not improving

## 2014-09-27 ENCOUNTER — Other Ambulatory Visit: Payer: Self-pay | Admitting: Family Medicine

## 2014-10-04 ENCOUNTER — Ambulatory Visit (INDEPENDENT_AMBULATORY_CARE_PROVIDER_SITE_OTHER): Payer: BLUE CROSS/BLUE SHIELD | Admitting: Family Medicine

## 2014-10-04 ENCOUNTER — Encounter: Payer: Self-pay | Admitting: Family Medicine

## 2014-10-04 VITALS — BP 126/70 | HR 75 | Temp 98.4°F | Wt 204.0 lb

## 2014-10-04 DIAGNOSIS — R102 Pelvic and perineal pain: Secondary | ICD-10-CM

## 2014-10-04 DIAGNOSIS — N509 Disorder of male genital organs, unspecified: Secondary | ICD-10-CM

## 2014-10-04 NOTE — Progress Notes (Signed)
   Subjective:    Patient ID: Peter Snyder, male    DOB: [birth year], 59 y.o.   MRN: [id number]  HPI Patient seen with episode yesterday of "spasm" perineal area. He states this lasted about 30-60 seconds and occurred after urinating. No burning with urination. No gross hematuria. No fevers or chills. No symptoms today. Denies any obstructive urinary symptoms. He is not any recent flank pain or lower abdominal pain. History of acute prostatitis and kidney stones in the symptoms are very different and very transient  Past Medical History  Diagnosis Date  . Arthritis   . Chicken pox   . Depression   . GERD (gastroesophageal reflux disease)   . CAD (coronary artery disease) 6/14    Cardiac catheterization 6/27/ 2014 ejection fraction 35-40%, 30% proximal left circumflex, 100% tiny obtuse marginal 1 with collaterals, 50% LAD, 50% D1, 100% RCA with collaterals  . Hypertension   . Hyperlipidemia   . Kidney stone   . Colon polyps   . Diabetes mellitus     Diet controlled   Past Surgical History  Procedure Laterality Date  . Tonsillectomy  1963    reports that he has never smoked. He does not have any smokeless tobacco history on file. He reports that he does not drink alcohol or use illicit drugs. family history includes Arthritis in his father and paternal grandmother; Heart disease in his maternal grandmother; Hyperlipidemia in his maternal grandmother. Allergies  Allergen Reactions  . Sulfa Antibiotics Rash      Review of Systems  Constitutional: Negative for fever and chills.  Respiratory: Negative for shortness of breath.   Cardiovascular: Negative for chest pain.  Gastrointestinal: Negative for abdominal pain.  Genitourinary: Negative for dysuria, frequency, hematuria, discharge, scrotal swelling, difficulty urinating and testicular pain.       Objective:   Physical Exam  Constitutional: He appears well-developed and well-nourished.  Cardiovascular: Normal rate and regular  rhythm.   Pulmonary/Chest: Effort normal and breath sounds normal. No respiratory distress. He has no wheezes. He has no rales.          Assessment & Plan:  Fleeting/very transient pain perineum area yesterday. This does not appear to be urinary. Urinalysis is normal. We recommend observation. This does not sound suspicious for prostatitis with very fleeting symptoms and resolved today. Reassurance given

## 2014-10-04 NOTE — Progress Notes (Signed)
Pre visit review using our clinic review tool, if applicable. No additional management support is needed unless otherwise documented below in the visit note. 

## 2014-10-11 ENCOUNTER — Encounter: Payer: Self-pay | Admitting: Family Medicine

## 2014-10-11 ENCOUNTER — Ambulatory Visit (INDEPENDENT_AMBULATORY_CARE_PROVIDER_SITE_OTHER): Payer: BLUE CROSS/BLUE SHIELD | Admitting: Family Medicine

## 2014-10-11 VITALS — BP 122/80 | HR 65 | Temp 98.2°F | Wt 202.0 lb

## 2014-10-11 DIAGNOSIS — R21 Rash and other nonspecific skin eruption: Secondary | ICD-10-CM

## 2014-10-11 NOTE — Progress Notes (Signed)
   Subjective:    Patient ID: Peter Snyder, male    DOB: [birth year], 59 y.o.   MRN: [id number]  HPI Patient seen with recurrent groin rash. He's had this off and on for years and has been using topical miconazole powder with minimal improvement. He stopped exercising over the past few days. He is wearing boxer shorts. He states that when he was seen in [place] years ago he had cultures including herpes which were negative. He does notice sometimes small blisters in the groin area has alternating rash sometimes with right left side. He has used topical cortisone with some relief.  He already has appointment scheduled see dermatologist tomorrow  Past Medical History  Diagnosis Date  . Arthritis   . Chicken pox   . Depression   . GERD (gastroesophageal reflux disease)   . CAD (coronary artery disease) 6/14    Cardiac catheterization 6/27/ 2014 ejection fraction 35-40%, 30% proximal left circumflex, 100% tiny obtuse marginal 1 with collaterals, 50% LAD, 50% D1, 100% RCA with collaterals  . Hypertension   . Hyperlipidemia   . Kidney stone   . Colon polyps   . Diabetes mellitus     Diet controlled   Past Surgical History  Procedure Laterality Date  . Tonsillectomy  1963    reports that he has never smoked. He does not have any smokeless tobacco history on file. He reports that he does not drink alcohol or use illicit drugs. family history includes Arthritis in his father and paternal grandmother; Heart disease in his maternal grandmother; Hyperlipidemia in his maternal grandmother. Allergies  Allergen Reactions  . Sulfa Antibiotics Rash      Review of Systems  Constitutional: Negative for fever and chills.  Skin: Positive for rash.  Hematological: Negative for adenopathy.       Objective:   Physical Exam  Constitutional: He appears well-developed and well-nourished. No distress.  Cardiovascular: Normal rate and regular rhythm.   Pulmonary/Chest: Effort normal and breath sounds  normal. No respiratory distress. He has no wheezes. He has no rales.  Skin: Rash noted.  Skin rash right groin region. Mildly erythematous. He appears to have a few small vesicles that have ruptured either from excoriations or spontaneously. Nonscaly.          Assessment & Plan:  Right groin rash. Question intertrigo. He states he has been cultured for herpes in the past and negative. He already has appointment tomorrow with dermatology and we recommended he keep that and continue topical anti-fungal in the meantime. Wear boxer shorts and change underwear as soon as possible after perspiration and keep dry as possible

## 2014-10-11 NOTE — Patient Instructions (Signed)
Continue with boxer shorts Get damp underwear off as soon as possible Continue with anti-fungal topicals for now.

## 2014-10-11 NOTE — Progress Notes (Signed)
Pre visit review using our clinic review tool, if applicable. No additional management support is needed unless otherwise documented below in the visit note. 

## 2014-10-23 ENCOUNTER — Encounter: Payer: Self-pay | Admitting: Family Medicine

## 2014-10-23 ENCOUNTER — Ambulatory Visit (INDEPENDENT_AMBULATORY_CARE_PROVIDER_SITE_OTHER): Payer: BLUE CROSS/BLUE SHIELD | Admitting: Family Medicine

## 2014-10-23 VITALS — BP 128/80 | HR 69 | Temp 98.4°F | Wt 202.0 lb

## 2014-10-23 DIAGNOSIS — R21 Rash and other nonspecific skin eruption: Secondary | ICD-10-CM | POA: Diagnosis not present

## 2014-10-23 NOTE — Progress Notes (Signed)
Pre visit review using our clinic review tool, if applicable. No additional management support is needed unless otherwise documented below in the visit note. 

## 2014-10-23 NOTE — Progress Notes (Signed)
   Subjective:    Patient ID: Peter Snyder, male    DOB: 01-03-1956, 59 y.o.   MRN: [id number]  HPI Patient here with persistent right groin rash. He is actually seeing dermatology as well and was given fluconazole tablets with instructions to take 150 mg once weekly for 3 weeks. He's taken one dose thus far. He has some irritation on the right groin which he has had intermittently for many years. He was trying some anti-fungal powder and then subsequently Lotrisone cream without much improvement. He does describe occasional small blisters that recently in that area. He thinks he had viral cultures previously which were negative. He has been scrubbing this area.  Area of rash is confined to the right groin with no left groin involvement. No penile lesions.  Past Medical History  Diagnosis Date  . Arthritis   . Chicken pox   . Depression   . GERD (gastroesophageal reflux disease)   . CAD (coronary artery disease) 6/14    Cardiac catheterization 6/27/ 2014 ejection fraction 35-40%, 30% proximal left circumflex, 100% tiny obtuse marginal 1 with collaterals, 50% LAD, 50% D1, 100% RCA with collaterals  . Hypertension   . Hyperlipidemia   . Kidney stone   . Colon polyps   . Diabetes mellitus     Diet controlled   Past Surgical History  Procedure Laterality Date  . Tonsillectomy  1963    reports that he has never smoked. He does not have any smokeless tobacco history on file. He reports that he does not drink alcohol or use illicit drugs. family history includes Arthritis in his father and paternal grandmother; Heart disease in his maternal grandmother; Hyperlipidemia in his maternal grandmother. Allergies  Allergen Reactions  . Sulfa Antibiotics Rash      Review of Systems  Constitutional: Negative for fever and chills.  Skin: Positive for rash.  Hematological: Negative for adenopathy.       Objective:   Physical Exam  Constitutional: He appears well-developed and well-nourished.   Cardiovascular: Normal rate and regular rhythm.   Pulmonary/Chest: Effort normal and breath sounds normal. No respiratory distress. He has no wheezes. He has no rales.  Skin: Rash noted.  Right groin area- inguinal crease reveals very mild erythema. He mostly has some very small about 2 mm areas of denuded epithelium. No pustules. No cellulitis changes.          Assessment & Plan:  Right groin rash. Etiology unclear. Some of his irritation may be secondary to scratching or too vigorous scrubbing.  Doubt herpetic but viral culture obtained. Also swab for bacterial culture. Keep areas dry as possible. Keep clean with soap and water. Do not overmedicate with topicals

## 2014-10-24 LAB — VIRAL CULTURE VIRC

## 2014-10-25 ENCOUNTER — Telehealth: Payer: Self-pay | Admitting: *Deleted

## 2014-10-25 NOTE — Telephone Encounter (Signed)
Scott called from [facility] wanting to know the source of the viral and anaerobic culture and if the physician wanted to check for herpes via the viral culture.   I advised Scott per Dr Elease Hashimoto the source for both is the right groin area and he does want to test for herpes.

## 2014-10-26 ENCOUNTER — Telehealth: Payer: Self-pay | Admitting: *Deleted

## 2014-10-26 LAB — WOUND CULTURE
Culture: NO GROWTH
GRAM STAIN: NONE SEEN
Gram Stain: NONE SEEN
ORGANISM ID, BACTERIA: NO GROWTH

## 2014-10-26 NOTE — Telephone Encounter (Signed)
Mateo Flow called from [facility] stating they have 2 wound cultures for the pt and she wanted to verify this and be sure this was not a duplicate.  I advised Mateo Flow per Dr Elease Hashimoto he ordered a viral culture and a bacterial culture and she stated Nicki Reaper must have misunderstood me and discarded one of the cultures and I advised her I did not recommend this as Dr Elease Hashimoto only verified the source of both and what he was looking for.  I transferred the call to Dr Elease Hashimoto.

## 2014-10-27 ENCOUNTER — Telehealth: Payer: Self-pay | Admitting: Family Medicine

## 2014-10-27 NOTE — Telephone Encounter (Signed)
Pt informed

## 2014-10-27 NOTE — Telephone Encounter (Signed)
Pt call to ask if the culture came back and if so would like a call back and how do he continue to treat you.

## 2014-10-30 LAB — HERPES SIMPLEX VIRUS CULTURE: ORGANISM ID, BACTERIA: UNDETERMINED

## 2014-10-31 ENCOUNTER — Telehealth: Payer: Self-pay | Admitting: Family Medicine

## 2014-10-31 NOTE — Telephone Encounter (Addendum)
Pt would like rash culture result. Ok to leave message on vm. Pt has an appt to see dermatologist dr Renda Rolls tomorrow

## 2014-11-01 ENCOUNTER — Ambulatory Visit: Payer: BLUE CROSS/BLUE SHIELD | Admitting: Family Medicine

## 2014-11-01 NOTE — Telephone Encounter (Signed)
Culture negative

## 2014-11-01 NOTE — Telephone Encounter (Signed)
Pt informed

## 2014-11-03 ENCOUNTER — Ambulatory Visit (INDEPENDENT_AMBULATORY_CARE_PROVIDER_SITE_OTHER): Payer: BLUE CROSS/BLUE SHIELD | Admitting: Family Medicine

## 2014-11-03 ENCOUNTER — Encounter: Payer: Self-pay | Admitting: Family Medicine

## 2014-11-03 VITALS — BP 124/80 | HR 70 | Temp 98.2°F | Wt 206.0 lb

## 2014-11-03 DIAGNOSIS — R21 Rash and other nonspecific skin eruption: Secondary | ICD-10-CM

## 2014-11-03 NOTE — Patient Instructions (Signed)
Call to set up appointment with Dr Nevada Crane or [place] and let me know if you need referral.

## 2014-11-03 NOTE — Progress Notes (Signed)
   Subjective:    Patient ID: Peter Snyder, male    DOB: Jun 11, 1955, 59 y.o.   MRN: [id number]  HPI Patient seen with persistent rash right groin region. Long history of intermittent rash which was diagnosed as "intertrigo "by dermatologist in [place]. He has recently tried multiple topicals including anti-fungal and even fluconazole from dermatologists here but did not help. We recently obtained bacterial culture which was negative. He reportedly had repeat viral culture done per his dermatologist 2 days ago to rule out herpes. We had ordered viral culture here with her some question of whether this was set up inappropriately. He has some persistent areas that are not healing well right groin. He described these initially as some blisters. No history of herpes. He has been cleaning with some type of astringent agent  Past Medical History  Diagnosis Date  . Arthritis   . Chicken pox   . Depression   . GERD (gastroesophageal reflux disease)   . CAD (coronary artery disease) 6/14    Cardiac catheterization 6/27/ 2014 ejection fraction 35-40%, 30% proximal left circumflex, 100% tiny obtuse marginal 1 with collaterals, 50% LAD, 50% D1, 100% RCA with collaterals  . Hypertension   . Hyperlipidemia   . Kidney stone   . Colon polyps   . Diabetes mellitus     Diet controlled   Past Surgical History  Procedure Laterality Date  . Tonsillectomy  1963    reports that he has never smoked. He does not have any smokeless tobacco history on file. He reports that he does not drink alcohol or use illicit drugs. family history includes Arthritis in his father and paternal grandmother; Heart disease in his maternal grandmother; Hyperlipidemia in his maternal grandmother. Allergies  Allergen Reactions  . Sulfa Antibiotics Rash      Review of Systems  Constitutional: Negative for fever and chills.  Skin: Positive for rash.  Hematological: Negative for adenopathy.       Objective:   Physical Exam    Constitutional: He appears well-developed and well-nourished.  Cardiovascular: Normal rate and regular rhythm.   Pulmonary/Chest: Effort normal and breath sounds normal. No respiratory distress. He has no wheezes. He has no rales.  Skin:  Right groin reveals that he has couple of open raw areas (these are small 2-3 mm) which are scattered in his right inguinal region. No cellulitis changes. No drainage. No visible vesicles. No pustules          Assessment & Plan:  Rash right inguinal region. Etiology unclear. Viral culture pending per dermatology. Doubt herpes with number of lesions. Recent bacterial culture negative. We recommended either consider biopsy vs second dermatology opinion and he is leaning toward the later and will set this up. Leave off all astringents and keep clean with mild soap and water.

## 2014-11-03 NOTE — Progress Notes (Signed)
Pre visit review using our clinic review tool, if applicable. No additional management support is needed unless otherwise documented below in the visit note. 

## 2014-11-15 ENCOUNTER — Other Ambulatory Visit: Payer: Self-pay | Admitting: Family Medicine

## 2014-11-17 ENCOUNTER — Ambulatory Visit (INDEPENDENT_AMBULATORY_CARE_PROVIDER_SITE_OTHER): Payer: BLUE CROSS/BLUE SHIELD | Admitting: Family Medicine

## 2014-11-17 ENCOUNTER — Encounter: Payer: Self-pay | Admitting: Family Medicine

## 2014-11-17 VITALS — BP 120/80 | HR 81 | Temp 98.4°F | Wt 203.0 lb

## 2014-11-17 DIAGNOSIS — A084 Viral intestinal infection, unspecified: Secondary | ICD-10-CM

## 2014-11-17 MED ORDER — ONDANSETRON 8 MG PO TBDP: 8.0000 mg | ORAL_TABLET | Freq: Three times a day (TID) | ORAL | Status: DC | PRN

## 2014-11-17 NOTE — Progress Notes (Signed)
Pre visit review using our clinic review tool, if applicable. No additional management support is needed unless otherwise documented below in the visit note. 

## 2014-11-17 NOTE — Patient Instructions (Signed)

## 2014-11-17 NOTE — Progress Notes (Signed)
   Subjective:    Patient ID: Peter Snyder, male    DOB: 1955-12-23, 59 y.o.   MRN: [id number]  HPI   Acute visit. Patient seen with onset this morning around 4 AM of watery nonbloody diarrhea. He states from 4 AM to around 10 AM he had approximately 8 watery stools. These were nonbloody. No fever. No abdominal pain. He took some Imodium and has had no diarrhea since then. No recent antibiotics. No recent travels. He's had some mild nausea without vomiting. No sick exposures. He has kept down some toast this morning and keeping down some fluids  Past Medical History  Diagnosis Date  . Arthritis   . Chicken pox   . Depression   . GERD (gastroesophageal reflux disease)   . CAD (coronary artery disease) 6/14    Cardiac catheterization 6/27/ 2014 ejection fraction 35-40%, 30% proximal left circumflex, 100% tiny obtuse marginal 1 with collaterals, 50% LAD, 50% D1, 100% RCA with collaterals  . Hypertension   . Hyperlipidemia   . Kidney stone   . Colon polyps   . Diabetes mellitus     Diet controlled   Past Surgical History  Procedure Laterality Date  . Tonsillectomy  1963    reports that he has never smoked. He does not have any smokeless tobacco history on file. He reports that he does not drink alcohol or use illicit drugs. family history includes Arthritis in his father and paternal grandmother; Heart disease in his maternal grandmother; Hyperlipidemia in his maternal grandmother. Allergies  Allergen Reactions  . Sulfa Antibiotics Rash      Review of Systems  Constitutional: Positive for appetite change. Negative for fever and chills.  Respiratory: Negative for shortness of breath.   Cardiovascular: Negative for chest pain.  Gastrointestinal: Positive for nausea and diarrhea. Negative for vomiting, blood in stool and abdominal distention.       Objective:   Physical Exam  Constitutional: He appears well-developed and well-nourished.  HENT:  Mouth/Throat: Oropharynx is  clear and moist.  Cardiovascular: Normal rate and regular rhythm.   Pulmonary/Chest: Effort normal and breath sounds normal. No respiratory distress. He has no wheezes. He has no rales.  Abdominal: Soft. Bowel sounds are normal. He exhibits no distension and no mass. There is no tenderness. There is no rebound and no guarding.          Assessment & Plan:  Diarrhea. Suspect acute viral gastroenteritis. Zofran 8 mg every 8 hours as needed for nausea. His diarrhea has improved with Imodium and continue as needed. We elected not to get any labs today since his diarrhea is very acute and has improved over the past few hours following Imodium. Follow-up for any fever, bloody stool changes or other new symptoms

## 2014-11-22 ENCOUNTER — Ambulatory Visit (INDEPENDENT_AMBULATORY_CARE_PROVIDER_SITE_OTHER): Payer: BLUE CROSS/BLUE SHIELD | Admitting: Family Medicine

## 2014-11-22 ENCOUNTER — Encounter: Payer: Self-pay | Admitting: Family Medicine

## 2014-11-22 VITALS — BP 128/70 | HR 80 | Temp 97.6°F | Wt 203.0 lb

## 2014-11-22 DIAGNOSIS — R109 Unspecified abdominal pain: Secondary | ICD-10-CM

## 2014-11-22 NOTE — Progress Notes (Signed)
Pre visit review using our clinic review tool, if applicable. No additional management support is needed unless otherwise documented below in the visit note. 

## 2014-11-22 NOTE — Progress Notes (Signed)
   Subjective:    Patient ID: Peter Snyder, male    DOB: Dec 30, 1955, 59 y.o.   MRN: [id number]  HPI Patient seen for follow-up regarding recent diarrhea. Refer to previous note. He had particularly severe diarrhea the first day since then has had only intermittent symptoms. Took some Imodium Developed some constipation then followed that with some MiraLAX and since then has had some occasional loose stools. He has some occasional mild diffuse abdominal cramping. No further vomiting. Appetite is intermittent. Keeping down fluids well. He did try to advance diet and had some loose stools after eating some roast beef with gravy yesterday. No fever. No recent antibiotics. No recent travels.  Past Medical History  Diagnosis Date  . Arthritis   . Chicken pox   . Depression   . GERD (gastroesophageal reflux disease)   . CAD (coronary artery disease) 6/14    Cardiac catheterization 6/27/ 2014 ejection fraction 35-40%, 30% proximal left circumflex, 100% tiny obtuse marginal 1 with collaterals, 50% LAD, 50% D1, 100% RCA with collaterals  . Hypertension   . Hyperlipidemia   . Kidney stone   . Colon polyps   . Diabetes mellitus     Diet controlled   Past Surgical History  Procedure Laterality Date  . Tonsillectomy  1963    reports that he has never smoked. He does not have any smokeless tobacco history on file. He reports that he does not drink alcohol or use illicit drugs. family history includes Arthritis in his father and paternal grandmother; Heart disease in his maternal grandmother; Hyperlipidemia in his maternal grandmother. Allergies  Allergen Reactions  . Sulfa Antibiotics Rash      Review of Systems  Constitutional: Negative for fever and chills.  Respiratory: Negative for shortness of breath.   Cardiovascular: Negative for chest pain.  Gastrointestinal: Negative for nausea, vomiting and blood in stool.       Objective:   Physical Exam  Constitutional: He appears  well-developed and well-nourished.  HENT:  Mouth/Throat: Oropharynx is clear and moist.  Cardiovascular: Normal rate and regular rhythm.   Pulmonary/Chest: Effort normal and breath sounds normal. No respiratory distress. He has no wheezes. He has no rales.  Abdominal: Soft. Bowel sounds are normal. He exhibits no distension and no mass. There is no tenderness. There is no rebound and no guarding.          Assessment & Plan:  Recent gastroenteritis. He had some diarrhea related to this and subsequently developed constipation after taking Imodium. We've recommended he leave off any Imodium or MiraLAX at this point and follow prudent diet. We reviewed dietary factors. Stay well-hydrated. Overall, he appears to be improving

## 2014-11-23 ENCOUNTER — Ambulatory Visit (INDEPENDENT_AMBULATORY_CARE_PROVIDER_SITE_OTHER): Payer: BLUE CROSS/BLUE SHIELD | Admitting: Family Medicine

## 2014-11-23 ENCOUNTER — Encounter: Payer: Self-pay | Admitting: Family Medicine

## 2014-11-23 VITALS — BP 120/70 | HR 88 | Temp 97.6°F | Wt 203.0 lb

## 2014-11-23 DIAGNOSIS — R21 Rash and other nonspecific skin eruption: Secondary | ICD-10-CM

## 2014-11-23 MED ORDER — MUPIROCIN 2 % EX OINT: 1.0000 "application " | TOPICAL_OINTMENT | Freq: Two times a day (BID) | CUTANEOUS | Status: DC

## 2014-11-23 NOTE — Progress Notes (Signed)
Pre visit review using our clinic review tool, if applicable. No additional management support is needed unless otherwise documented below in the visit note. 

## 2014-11-23 NOTE — Progress Notes (Signed)
   Subjective:    Patient ID: Peter Snyder, male    DOB: 05-17-1955, 59 y.o.   MRN: [id number]  HPI  patient seen with sore area left side of scrotum. He just noticed some slight soreness yesterday and today noted a small eschar. He thinks he might have accidentally pulled some skin away from that area but is not sure. In any event , he's had some pain with ambulation and walking involving the area of involved scrotum. No fevers or chills. Recent rash right groin region is finally clearing  Past Medical History  Diagnosis Date  . Arthritis   . Chicken pox   . Depression   . GERD (gastroesophageal reflux disease)   . CAD (coronary artery disease) 6/14    Cardiac catheterization 6/27/ 2014 ejection fraction 35-40%, 30% proximal left circumflex, 100% tiny obtuse marginal 1 with collaterals, 50% LAD, 50% D1, 100% RCA with collaterals  . Hypertension   . Hyperlipidemia   . Kidney stone   . Colon polyps   . Diabetes mellitus     Diet controlled   Past Surgical History  Procedure Laterality Date  . Tonsillectomy  1963    reports that he has never smoked. He does not have any smokeless tobacco history on file. He reports that he does not drink alcohol or use illicit drugs. family history includes Arthritis in his father and paternal grandmother; Heart disease in his maternal grandmother; Hyperlipidemia in his maternal grandmother. Allergies  Allergen Reactions  . Sulfa Antibiotics Rash      Review of Systems  Constitutional: Negative for fever and chills.       Objective:   Physical Exam  Constitutional: He appears well-developed and well-nourished.  Cardiovascular: Normal rate and regular rhythm.   Pulmonary/Chest: Effort normal and breath sounds normal. No respiratory distress. He has no wheezes.  Skin:  Left inferior scrotum reveals approximately 6 mm area of small eschar. No surrounding erythema. No necrosis          Assessment & Plan:  Left scrotal eschar. Unknown  injury. Keep clean with soap and water. Bactroban ointment twice daily. Consider briefs for better support if still painful.

## 2014-11-23 NOTE — Patient Instructions (Signed)
KEEP CLEAN WITH SOAP AND WATER USE TOPICAL ANTIBIOTIC TWICE DAILY.

## 2014-12-08 ENCOUNTER — Ambulatory Visit (INDEPENDENT_AMBULATORY_CARE_PROVIDER_SITE_OTHER): Payer: BLUE CROSS/BLUE SHIELD | Admitting: Family Medicine

## 2014-12-08 ENCOUNTER — Encounter: Payer: Self-pay | Admitting: Family Medicine

## 2014-12-08 VITALS — BP 120/80 | HR 68 | Temp 98.3°F | Wt 208.0 lb

## 2014-12-08 DIAGNOSIS — L729 Follicular cyst of the skin and subcutaneous tissue, unspecified: Secondary | ICD-10-CM | POA: Diagnosis not present

## 2014-12-08 DIAGNOSIS — R233 Spontaneous ecchymoses: Secondary | ICD-10-CM

## 2014-12-08 DIAGNOSIS — M545 Low back pain, unspecified: Secondary | ICD-10-CM

## 2014-12-08 DIAGNOSIS — Z23 Encounter for immunization: Secondary | ICD-10-CM | POA: Diagnosis not present

## 2014-12-08 MED ORDER — FLUTICASONE PROPIONATE 50 MCG/ACT NA SUSP: 2.0000 | Freq: Every day | NASAL | Status: DC

## 2014-12-08 NOTE — Progress Notes (Signed)
Pre visit review using our clinic review tool, if applicable. No additional management support is needed unless otherwise documented below in the visit note. 

## 2014-12-08 NOTE — Progress Notes (Signed)
   Subjective:    Patient ID: Peter Snyder, male    DOB: 1955/08/23, 59 y.o.   MRN: [id number]  HPI Patient here with several items  Some bruising lower legs. He is not sure if this was trauma related. He has not noted any other bleeding issues such as easy bleeding from gums or any petechiae. No hematuria. He does take aspirin regularly. Only has a couple of bruises on the legs today.  Low back pain. Present intermittently in the past. Denies specific injury. Ambulating without difficulty. Pain is mostly midline lumbar without radiculopathy symptoms. Denies lower extremity weakness or numbness. No urine or stool incontinence. Exacerbated by back flexion. He has not tried any stretches. No heat or ice.  Small cystic type lesion left upper buttock region. His neighbor is a Paediatric nurse and they thought this represented some type of benign cyst. Just noticed incidentally recently. Nonpainful.  Past Medical History  Diagnosis Date  . Arthritis   . Chicken pox   . Depression   . GERD (gastroesophageal reflux disease)   . CAD (coronary artery disease) 6/14    Cardiac catheterization 6/27/ 2014 ejection fraction 35-40%, 30% proximal left circumflex, 100% tiny obtuse marginal 1 with collaterals, 50% LAD, 50% D1, 100% RCA with collaterals  . Hypertension   . Hyperlipidemia   . Kidney stone   . Colon polyps   . Diabetes mellitus     Diet controlled   Past Surgical History  Procedure Laterality Date  . Tonsillectomy  1963    reports that he has never smoked. He does not have any smokeless tobacco history on file. He reports that he does not drink alcohol or use illicit drugs. family history includes Arthritis in his father and paternal grandmother; Heart disease in his maternal grandmother; Hyperlipidemia in his maternal grandmother. Allergies  Allergen Reactions  . Sulfa Antibiotics Rash      Review of Systems  Constitutional: Negative for fever, activity change and appetite change.    Respiratory: Negative for cough and shortness of breath.   Cardiovascular: Negative for chest pain and leg swelling.  Gastrointestinal: Negative for vomiting and abdominal pain.  Genitourinary: Negative for dysuria, hematuria and flank pain.  Musculoskeletal: Positive for back pain. Negative for joint swelling.  Neurological: Negative for weakness and numbness.  Hematological: Negative for adenopathy. Bruises/bleeds easily.       Objective:   Physical Exam  Constitutional: He appears well-developed and well-nourished.  Neck: Neck supple. No thyromegaly present.  Cardiovascular: Normal rate and regular rhythm.   No murmur heard. Pulmonary/Chest: Effort normal and breath sounds normal. No respiratory distress. He has no wheezes. He has no rales.  Musculoskeletal: He exhibits no edema.  Lymphadenopathy:    He has no cervical adenopathy.  Neurological:  Full-strength lower extremities. Symmetric reflexes.  Skin:  He has couple of bruises on his right calf. No hematoma. No petechiae. No upper extremity bruises  Right upper buttock region approximately 1/2 cm slightly mobile nontender rounded cystic lesion. No overlying skin changes.          Assessment & Plan:  #1 low back pain. Nonfocal exam. Reviewed extension stretches with handout given. Consider physical therapy if not improving over the next 2 weeks #2 spontaneous ecchymosis. Minimal involving lower extremities. Doubt clinically significant. Reassurance #3 benign cyst left buttock. Totally benign features on exam. Observe for now. Follow-up for any pain or rapid growth rather changes.

## 2014-12-08 NOTE — Patient Instructions (Signed)
Low Back Sprain with Rehab  A sprain is an injury in which a ligament is torn. The ligaments of the lower back are vulnerable to sprains. However, they are strong and require great force to be injured. These ligaments are important for stabilizing the spinal column. Sprains are classified into three categories. Grade 1 sprains cause pain, but the tendon is not lengthened. Grade 2 sprains include a lengthened ligament, due to the ligament being stretched or partially ruptured. With grade 2 sprains there is still function, although the function may be decreased. Grade 3 sprains involve a complete tear of the tendon or muscle, and function is usually impaired. SYMPTOMS   Severe pain in the lower back.  Sometimes, a feeling of a "pop," "snap," or tear, at the time of injury.  Tenderness and sometimes swelling at the injury site.  Uncommonly, bruising (contusion) within 48 hours of injury.  Muscle spasms in the back. CAUSES  Low back sprains occur when a force is placed on the ligaments that is greater than they can handle. Common causes of injury include:  Performing a stressful act while off-balance.  Repetitive stressful activities that involve movement of the lower back.  Direct hit (trauma) to the lower back. RISK INCREASES WITH:  Contact sports (football, wrestling).  Collisions (major skiing accidents).  Sports that require throwing or lifting (baseball, weightlifting).  Sports involving twisting of the spine (gymnastics, diving, tennis, golf).  Poor strength and flexibility.  Inadequate protection.  Previous back injury or surgery (especially fusion). PREVENTION  Wear properly fitted and padded protective equipment.  Warm up and stretch properly before activity.  Allow for adequate recovery between workouts.  Maintain physical fitness:  Strength, flexibility, and endurance.  Cardiovascular fitness.  Maintain a healthy body weight. PROGNOSIS  If treated  properly, low back sprains usually heal with non-surgical treatment. The length of time for healing depends on the severity of the injury.  RELATED COMPLICATIONS   Recurring symptoms, resulting in a chronic problem.  Chronic inflammation and pain in the low back.  Delayed healing or resolution of symptoms, especially if activity is resumed too soon.  Prolonged impairment.  Unstable or arthritic joints of the low back. TREATMENT  Treatment first involves the use of ice and medicine, to reduce pain and inflammation. The use of strengthening and stretching exercises may help reduce pain with activity. These exercises may be performed at home or with a therapist. Severe injuries may require referral to a therapist for further evaluation and treatment, such as ultrasound. Your caregiver may advise that you wear a back brace or corset, to help reduce pain and discomfort. Often, prolonged bed rest results in greater harm then benefit. Corticosteroid injections may be recommended. However, these should be reserved for the most serious cases. It is important to avoid using your back when lifting objects. At night, sleep on your back on a firm mattress, with a pillow placed under your knees. If non-surgical treatment is unsuccessful, surgery may be needed.  MEDICATION   If pain medicine is needed, nonsteroidal anti-inflammatory medicines (aspirin and ibuprofen), or other minor pain relievers (acetaminophen), are often advised.  Do not take pain medicine for 7 days before surgery.  Prescription pain relievers may be given, if your caregiver thinks they are needed. Use only as directed and only as much as you need.  Ointments applied to the skin may be helpful.  Corticosteroid injections may be given by your caregiver. These injections should be reserved for the most serious cases,   because they may only be given a certain number of times. HEAT AND COLD  Cold treatment (icing) should be applied for 10  to 15 minutes every 2 to 3 hours for inflammation and pain, and immediately after activity that aggravates your symptoms. Use ice packs or an ice massage.  Heat treatment may be used before performing stretching and strengthening activities prescribed by your caregiver, physical therapist, or athletic trainer. Use a heat pack or a warm water soak. SEEK MEDICAL CARE IF:   Symptoms get worse or do not improve in 2 to 4 weeks, despite treatment.  You develop numbness or weakness in either leg.  You lose bowel or bladder function.  Any of the following occur after surgery: fever, increased pain, swelling, redness, drainage of fluids, or bleeding in the affected area.  New, unexplained symptoms develop. (Drugs used in treatment may produce side effects.) EXERCISES  RANGE OF MOTION (ROM) AND STRETCHING EXERCISES - Low Back Sprain Most people with lower back pain will find that their symptoms get worse with excessive bending forward (flexion) or arching at the lower back (extension). The exercises that will help resolve your symptoms will focus on the opposite motion.  Your physician, physical therapist or athletic trainer will help you determine which exercises will be most helpful to resolve your lower back pain. Do not complete any exercises without first consulting with your caregiver. Discontinue any exercises which make your symptoms worse, until you speak to your caregiver. If you have pain, numbness or tingling which travels down into your buttocks, leg or foot, the goal of the therapy is for these symptoms to move closer to your back and eventually resolve. Sometimes, these leg symptoms will get better, but your lower back pain may worsen. This is often an indication of progress in your rehabilitation. Be very alert to any changes in your symptoms and the activities in which you participated in the 24 hours prior to the change. Sharing this information with your caregiver will allow him or her to  most efficiently treat your condition. These exercises may help you when beginning to rehabilitate your injury. Your symptoms may resolve with or without further involvement from your physician, physical therapist or athletic trainer. While completing these exercises, remember:   Restoring tissue flexibility helps normal motion to return to the joints. This allows healthier, less painful movement and activity.  An effective stretch should be held for at least 30 seconds.  A stretch should never be painful. You should only feel a gentle lengthening or release in the stretched tissue. FLEXION RANGE OF MOTION AND STRETCHING EXERCISES: STRETCH - Flexion, Single Knee to Chest   Lie on a firm bed or floor with both legs extended in front of you.  Keeping one leg in contact with the floor, bring your opposite knee to your chest. Hold your leg in place by either grabbing behind your thigh or at your knee.  Pull until you feel a gentle stretch in your low back. Hold __________ seconds.  Slowly release your grasp and repeat the exercise with the opposite side. Repeat __________ times. Complete this exercise __________ times per day.  STRETCH - Flexion, Double Knee to Chest  Lie on a firm bed or floor with both legs extended in front of you.  Keeping one leg in contact with the floor, bring your opposite knee to your chest.  Tense your stomach muscles to support your back and then lift your other knee to your chest. Hold your legs   in place by either grabbing behind your thighs or at your knees.  Pull both knees toward your chest until you feel a gentle stretch in your low back. Hold __________ seconds.  Tense your stomach muscles and slowly return one leg at a time to the floor. Repeat __________ times. Complete this exercise __________ times per day.  STRETCH - Low Trunk Rotation  Lie on a firm bed or floor. Keeping your legs in front of you, bend your knees so they are both pointed toward the  ceiling and your feet are flat on the floor.  Extend your arms out to the side. This will stabilize your upper body by keeping your shoulders in contact with the floor.  Gently and slowly drop both knees together to one side until you feel a gentle stretch in your low back. Hold for __________ seconds.  Tense your stomach muscles to support your lower back as you bring your knees back to the starting position. Repeat the exercise to the other side. Repeat __________ times. Complete this exercise __________ times per day  EXTENSION RANGE OF MOTION AND FLEXIBILITY EXERCISES: STRETCH - Extension, Prone on Elbows   Lie on your stomach on the floor, a bed will be too soft. Place your palms about shoulder width apart and at the height of your head.  Place your elbows under your shoulders. If this is too painful, stack pillows under your chest.  Allow your body to relax so that your hips drop lower and make contact more completely with the floor.  Hold this position for __________ seconds.  Slowly return to lying flat on the floor. Repeat __________ times. Complete this exercise __________ times per day.  RANGE OF MOTION - Extension, Prone Press Ups  Lie on your stomach on the floor, a bed will be too soft. Place your palms about shoulder width apart and at the height of your head.  Keeping your back as relaxed as possible, slowly straighten your elbows while keeping your hips on the floor. You may adjust the placement of your hands to maximize your comfort. As you gain motion, your hands will come more underneath your shoulders.  Hold this position __________ seconds.  Slowly return to lying flat on the floor. Repeat __________ times. Complete this exercise __________ times per day.  RANGE OF MOTION- Quadruped, Neutral Spine   Assume a hands and knees position on a firm surface. Keep your hands under your shoulders and your knees under your hips. You may place padding under your knees for  comfort.  Drop your head and point your tailbone toward the ground below you. This will round out your lower back like an angry cat. Hold this position for __________ seconds.  Slowly lift your head and release your tail bone so that your back sags into a large arch, like an old horse.  Hold this position for __________ seconds.  Repeat this until you feel limber in your low back.  Now, find your "sweet spot." This will be the most comfortable position somewhere between the two previous positions. This is your neutral spine. Once you have found this position, tense your stomach muscles to support your low back.  Hold this position for __________ seconds. Repeat __________ times. Complete this exercise __________ times per day.  STRENGTHENING EXERCISES - Low Back Sprain These exercises may help you when beginning to rehabilitate your injury. These exercises should be done near your "sweet spot." This is the neutral, low-back arch, somewhere between fully rounded   and fully arched, that is your least painful position. When performed in this safe range of motion, these exercises can be used for people who have either a flexion or extension based injury. These exercises may resolve your symptoms with or without further involvement from your physician, physical therapist or athletic trainer. While completing these exercises, remember:   Muscles can gain both the endurance and the strength needed for everyday activities through controlled exercises.  Complete these exercises as instructed by your physician, physical therapist or athletic trainer. Increase the resistance and repetitions only as guided.  You may experience muscle soreness or fatigue, but the pain or discomfort you are trying to eliminate should never worsen during these exercises. If this pain does worsen, stop and make certain you are following the directions exactly. If the pain is still present after adjustments, discontinue the  exercise until you can discuss the trouble with your caregiver. STRENGTHENING - Deep Abdominals, Pelvic Tilt   Lie on a firm bed or floor. Keeping your legs in front of you, bend your knees so they are both pointed toward the ceiling and your feet are flat on the floor.  Tense your lower abdominal muscles to press your low back into the floor. This motion will rotate your pelvis so that your tail bone is scooping upwards rather than pointing at your feet or into the floor. With a gentle tension and even breathing, hold this position for __________ seconds. Repeat __________ times. Complete this exercise __________ times per day.  STRENGTHENING - Abdominals, Crunches   Lie on a firm bed or floor. Keeping your legs in front of you, bend your knees so they are both pointed toward the ceiling and your feet are flat on the floor. Cross your arms over your chest.  Slightly tip your chin down without bending your neck.  Tense your abdominals and slowly lift your trunk high enough to just clear your shoulder blades. Lifting higher can put excessive stress on the lower back and does not further strengthen your abdominal muscles.  Control your return to the starting position. Repeat __________ times. Complete this exercise __________ times per day.  STRENGTHENING - Quadruped, Opposite UE/LE Lift   Assume a hands and knees position on a firm surface. Keep your hands under your shoulders and your knees under your hips. You may place padding under your knees for comfort.  Find your neutral spine and gently tense your abdominal muscles so that you can maintain this position. Your shoulders and hips should form a rectangle that is parallel with the floor and is not twisted.  Keeping your trunk steady, lift your right hand no higher than your shoulder and then your left leg no higher than your hip. Make sure you are not holding your breath. Hold this position for __________ seconds.  Continuing to keep  your abdominal muscles tense and your back steady, slowly return to your starting position. Repeat with the opposite arm and leg. Repeat __________ times. Complete this exercise __________ times per day.  STRENGTHENING - Abdominals and Quadriceps, Straight Leg Raise   Lie on a firm bed or floor with both legs extended in front of you.  Keeping one leg in contact with the floor, bend the other knee so that your foot can rest flat on the floor.  Find your neutral spine, and tense your abdominal muscles to maintain your spinal position throughout the exercise.  Slowly lift your straight leg off the floor about 6 inches for a count   of 15, making sure to not hold your breath.  Still keeping your neutral spine, slowly lower your leg all the way to the floor. Repeat this exercise with each leg __________ times. Complete this exercise __________ times per day. POSTURE AND BODY MECHANICS CONSIDERATIONS - Low Back Sprain Keeping correct posture when sitting, standing or completing your activities will reduce the stress put on different body tissues, allowing injured tissues a chance to heal and limiting painful experiences. The following are general guidelines for improved posture. Your physician or physical therapist will provide you with any instructions specific to your needs. While reading these guidelines, remember:  The exercises prescribed by your provider will help you have the flexibility and strength to maintain correct postures.  The correct posture provides the best environment for your joints to work. All of your joints have less wear and tear when properly supported by a spine with good posture. This means you will experience a healthier, less painful body.  Correct posture must be practiced with all of your activities, especially prolonged sitting and standing. Correct posture is as important when doing repetitive low-stress activities (typing) as it is when doing a single heavy-load  activity (lifting). RESTING POSITIONS Consider which positions are most painful for you when choosing a resting position. If you have pain with flexion-based activities (sitting, bending, stooping, squatting), choose a position that allows you to rest in a less flexed posture. You would want to avoid curling into a fetal position on your side. If your pain worsens with extension-based activities (prolonged standing, working overhead), avoid resting in an extended position such as sleeping on your stomach. Most people will find more comfort when they rest with their spine in a more neutral position, neither too rounded nor too arched. Lying on a non-sagging bed on your side with a pillow between your knees, or on your back with a pillow under your knees will often provide some relief. Keep in mind, being in any one position for a prolonged period of time, no matter how correct your posture, can still lead to stiffness. PROPER SITTING POSTURE In order to minimize stress and discomfort on your spine, you must sit with correct posture. Sitting with good posture should be effortless for a healthy body. Returning to good posture is a gradual process. Many people can work toward this most comfortably by using various supports until they have the flexibility and strength to maintain this posture on their own. When sitting with proper posture, your ears will fall over your shoulders and your shoulders will fall over your hips. You should use the back of the chair to support your upper back. Your lower back will be in a neutral position, just slightly arched. You may place a small pillow or folded towel at the base of your lower back for  support.  When working at a desk, create an environment that supports good, upright posture. Without extra support, muscles tire, which leads to excessive strain on joints and other tissues. Keep these recommendations in mind: CHAIR:  A chair should be able to slide under your desk  when your back makes contact with the back of the chair. This allows you to work closely.  The chair's height should allow your eyes to be level with the upper part of your monitor and your hands to be slightly lower than your elbows. BODY POSITION  Your feet should make contact with the floor. If this is not possible, use a foot rest.  Keep your   ears over your shoulders. This will reduce stress on your neck and low back. INCORRECT SITTING POSTURES  If you are feeling tired and unable to assume a healthy sitting posture, do not slouch or slump. This puts excessive strain on your back tissues, causing more damage and pain. Healthier options include:  Using more support, like a lumbar pillow.  Switching tasks to something that requires you to be upright or walking.  Talking a brief walk.  Lying down to rest in a neutral-spine position. PROLONGED STANDING WHILE SLIGHTLY LEANING FORWARD  When completing a task that requires you to lean forward while standing in one place for a long time, place either foot up on a stationary 2-4 inch high object to help maintain the best posture. When both feet are on the ground, the lower back tends to lose its slight inward curve. If this curve flattens (or becomes too large), then the back and your other joints will experience too much stress, tire more quickly, and can cause pain. CORRECT STANDING POSTURES Proper standing posture should be assumed with all daily activities, even if they only take a few moments, like when brushing your teeth. As in sitting, your ears should fall over your shoulders and your shoulders should fall over your hips. You should keep a slight tension in your abdominal muscles to brace your spine. Your tailbone should point down to the ground, not behind your body, resulting in an over-extended swayback posture.  INCORRECT STANDING POSTURES  Common incorrect standing postures include a forward head, locked knees and/or an excessive  swayback. WALKING Walk with an upright posture. Your ears, shoulders and hips should all line-up. PROLONGED ACTIVITY IN A FLEXED POSITION When completing a task that requires you to bend forward at your waist or lean over a low surface, try to find a way to stabilize 3 out of 4 of your limbs. You can place a hand or elbow on your thigh or rest a knee on the surface you are reaching across. This will provide you more stability, so that your muscles do not tire as quickly. By keeping your knees relaxed, or slightly bent, you will also reduce stress across your lower back. CORRECT LIFTING TECHNIQUES DO :  Assume a wide stance. This will provide you more stability and the opportunity to get as close as possible to the object which you are lifting.  Tense your abdominals to brace your spine. Bend at the knees and hips. Keeping your back locked in a neutral-spine position, lift using your leg muscles. Lift with your legs, keeping your back straight.  Test the weight of unknown objects before attempting to lift them.  Try to keep your elbows locked down at your sides in order get the best strength from your shoulders when carrying an object.  Always ask for help when lifting heavy or awkward objects. INCORRECT LIFTING TECHNIQUES DO NOT:   Lock your knees when lifting, even if it is a small object.  Bend and twist. Pivot at your feet or move your feet when needing to change directions.  Assume that you can safely pick up even a paperclip without proper posture. Document Released: 03/03/2005 Document Revised: 05/26/2011 Document Reviewed: 06/15/2008 [employer] Patient Information 2015 [employer]. This information is not intended to replace advice given to you by your health care provider. Make sure you discuss any questions you have with your health care provider.  

## 2014-12-10 ENCOUNTER — Other Ambulatory Visit: Payer: Self-pay | Admitting: Family Medicine

## 2014-12-19 ENCOUNTER — Telehealth: Payer: Self-pay | Admitting: Family Medicine

## 2014-12-19 ENCOUNTER — Telehealth: Payer: Self-pay | Admitting: Cardiology

## 2014-12-19 NOTE — Telephone Encounter (Signed)
yes

## 2014-12-19 NOTE — Telephone Encounter (Signed)
Returned call to patient - he was walking earlier, felt burning in his chest and to right of chest while walking. No arm pain, no shortness of breath. Patient is better now. He thinks he had gas. He was advised that if he experiences any chest pressure/pain/tighthness/squeezing in chest to let us know. He voiced understanding.

## 2014-12-19 NOTE — Telephone Encounter (Signed)
Pt called in stating that today while he was going for a walk, he began to feel some burning in his chest. He states that couldn't distinguish whether it is indigestion or not but it concerned him. He says that the burning sensation has subsided but would still like to speak with someone.   Thanks

## 2014-12-19 NOTE — Telephone Encounter (Signed)
Pt has been sch

## 2014-12-19 NOTE — Telephone Encounter (Signed)
Pt is having gerd and would like an appt tomorrow. Can I use sda slot?

## 2014-12-20 ENCOUNTER — Ambulatory Visit (INDEPENDENT_AMBULATORY_CARE_PROVIDER_SITE_OTHER): Payer: BLUE CROSS/BLUE SHIELD | Admitting: Family Medicine

## 2014-12-20 ENCOUNTER — Encounter: Payer: Self-pay | Admitting: Family Medicine

## 2014-12-20 VITALS — BP 124/80 | HR 73 | Temp 97.7°F | Ht 74.0 in | Wt 210.4 lb

## 2014-12-20 DIAGNOSIS — E785 Hyperlipidemia, unspecified: Secondary | ICD-10-CM

## 2014-12-20 DIAGNOSIS — R0789 Other chest pain: Secondary | ICD-10-CM

## 2014-12-20 DIAGNOSIS — R7303 Prediabetes: Secondary | ICD-10-CM

## 2014-12-20 DIAGNOSIS — I255 Ischemic cardiomyopathy: Secondary | ICD-10-CM

## 2014-12-20 NOTE — Patient Instructions (Addendum)
Food Choices for Gastroesophageal Reflux Disease, Adult When you have gastroesophageal reflux disease (GERD), the foods you eat and your eating habits are very important. Choosing the right foods can help ease the discomfort of GERD. WHAT GENERAL GUIDELINES DO I NEED TO FOLLOW?  Choose fruits, vegetables, whole grains, low-fat dairy products, and low-fat meat, fish, and poultry.  Limit fats such as oils, salad dressings, butter, nuts, and avocado.  Keep a food diary to identify foods that cause symptoms.  Avoid foods that cause reflux. These may be different for different people.  Eat frequent small meals instead of three large meals each day.  Eat your meals slowly, in a relaxed setting.  Limit fried foods.  Cook foods using methods other than frying.  Avoid drinking alcohol.  Avoid drinking large amounts of liquids with your meals.  Avoid bending over or lying down until 2-3 hours after eating. WHAT FOODS ARE NOT RECOMMENDED? The following are some foods and drinks that may worsen your symptoms: Vegetables Tomatoes. Tomato juice. Tomato and spaghetti sauce. Chili peppers. Onion and garlic. Horseradish. Fruits Oranges, grapefruit, and lemon (fruit and juice). Meats High-fat meats, fish, and poultry. This includes hot dogs, ribs, ham, sausage, salami, and bacon. Dairy Whole milk and chocolate milk. Sour cream. Cream. Butter. Ice cream. Cream cheese.  Beverages Coffee and tea, with or without caffeine. Carbonated beverages or energy drinks. Condiments Hot sauce. Barbecue sauce.  Sweets/Desserts Chocolate and cocoa. Donuts. Peppermint and spearmint. Fats and Oils High-fat foods, including [place] fries and potato chips. Other Vinegar. Strong spices, such as black pepper, white pepper, red pepper, cayenne, curry powder, cloves, ginger, and chili powder. The items listed above may not be a complete list of foods and beverages to avoid. Contact your dietitian for more  information.   This information is not intended to replace advice given to you by your health care provider. Make sure you discuss any questions you have with your health care provider.   Document Released: 03/03/2005 Document Revised: 03/24/2014 Document Reviewed: 01/05/2013 Elsevier Interactive Patient Education 2016 [employer].   Consider elevate head of bed 6-8 inches Supplement Protonix with Zantac as needed.

## 2014-12-20 NOTE — Progress Notes (Signed)
   Subjective:    Patient ID: Peter Snyder, male    DOB: 04-Sep-1955, 59 y.o.   MRN: [id number]  HPI Patient seen with episode of atypical chest pain yesterday. He was walking with his son around 3 AM which is his typical time of day for walking. He noticed some burning through his epigastric area and this radiated up toward the sternum he had some radiation of pain toward the right side of the chest. He denied any chest pressure or dyspnea. He does recall that he had eaten a fairly heavy breakfast as well as a heavy dinner the night before and he felt this was more typical of reflux. He did some burping and symptoms started to improve. He had some mild residual symptoms throughout the day. Denied any neck pain. No diaphoresis. No nausea.  Long-standing history of GERD. He takes Protonix 40 mg daily and supplement sometimes with Zantac. No recent dysphagia.  He does have history of CAD with occluded right coronary artery (with collateral flow) but has excellent exercise tolerance and has not had any exertional chest pain whatsoever until yesterday-but symptoms persisted throughout the day and were relatively constant.  Past Medical History  Diagnosis Date  . Arthritis   . Chicken pox   . Depression   . GERD (gastroesophageal reflux disease)   . CAD (coronary artery disease) 6/14    Cardiac catheterization 6/27/ 2014 ejection fraction 35-40%, 30% proximal left circumflex, 100% tiny obtuse marginal 1 with collaterals, 50% LAD, 50% D1, 100% RCA with collaterals  . Hypertension   . Hyperlipidemia   . Kidney stone   . Colon polyps   . Diabetes mellitus ([place])     Diet controlled   Past Surgical History  Procedure Laterality Date  . Tonsillectomy  1963    reports that he has never smoked. He does not have any smokeless tobacco history on file. He reports that he does not drink alcohol or use illicit drugs. family history includes Arthritis in his father and paternal grandmother; Heart disease  in his maternal grandmother; Hyperlipidemia in his maternal grandmother. Allergies  Allergen Reactions  . Sulfa Antibiotics Rash      Review of Systems  Constitutional: Negative for appetite change and unexpected weight change.  HENT: Negative for trouble swallowing.   Respiratory: Negative for shortness of breath.   Cardiovascular: Positive for chest pain. Negative for palpitations and leg swelling.  Gastrointestinal: Negative for nausea, vomiting, diarrhea, constipation, blood in stool and abdominal distention.  Neurological: Negative for dizziness.       Objective:   Physical Exam  Constitutional: He appears well-developed and well-nourished. No distress.  Neck: Neck supple. No JVD present.  Cardiovascular: Normal rate and regular rhythm.   Pulmonary/Chest: Effort normal and breath sounds normal. No respiratory distress. He has no wheezes. He has no rales.  Abdominal: Soft. Bowel sounds are normal. He exhibits no distension and no mass. There is no tenderness. There is no rebound and no guarding.  Musculoskeletal: He exhibits no edema.          Assessment & Plan:  Atypical chest symptoms-burning quality, right-sided chest pain, prolonged symptoms, no associated dyspnea, etc. makes this unlikely cardiac. Suspect GERD. He will double up Protonix to one twice daily over the next week. Touch base if symptoms not resolving over the next couple days Will obtain EKG to make sure no recent changes.  EKG shows Q waves 3 and aVF consistent with prior inferior MI. No acute findings

## 2014-12-20 NOTE — Progress Notes (Signed)
Pre visit review using our clinic review tool, if applicable. No additional management support is needed unless otherwise documented below in the visit note. 

## 2015-01-12 ENCOUNTER — Telehealth: Payer: Self-pay | Admitting: Cardiology

## 2015-01-12 ENCOUNTER — Telehealth: Payer: Self-pay | Admitting: Family Medicine

## 2015-01-12 NOTE — Telephone Encounter (Signed)
Ok to hold ASA for procedure and resume after. Kirk Ruths

## 2015-01-12 NOTE — Telephone Encounter (Signed)
Follow BP for now.  If consistently > 140/90 be in touch.

## 2015-01-12 NOTE — Telephone Encounter (Signed)
Patient called wanting to know if he could come off baby aspirin couple days prior to his back procedure

## 2015-01-12 NOTE — Telephone Encounter (Signed)
Returned call to patient.He stated he wanted to ask Dr.Crenshaw if ok to hold aspirin 5 days prior to radio frequency back procedure scheduled for next Friday.Message sent to [facility] for advice.

## 2015-01-12 NOTE — Telephone Encounter (Signed)
Patient Name: Peter Snyder DOB: 1955/09/01 Initial Comment caller states BP is elevated Nurse Assessment Nurse: Marcelline Deist, RN, Kermit Balo Date/Time (Eastern Time): 01/12/2015 9:54:57 AM Confirm and document reason for call. If symptomatic, describe symptoms. ---Caller states BP is elevated - 158/90. Usually takes it every day 110's/70's, now 139/83. On BP rx., no recent changes. Has the patient traveled out of the country within the last 30 days? ---Not Applicable Does the patient have any new or worsening symptoms? ---Yes Will a triage be completed? ---Yes Related visit to physician within the last 2 weeks? ---No Does the PT have any chronic conditions? (i.e. diabetes, asthma, etc.) ---Yes List chronic conditions. ---on BP rx., right coronary artery is blocked, no stent, back issue, going to do an ablasion next Friday. Guidelines Guideline Title Affirmed Question Affirmed Notes High Blood Pressure [1] BP # 140/90 AND [2] taking BP medications Final Disposition User See PCP within [place], RN, Kermit Balo Comments Caller also wanted to make PCP aware that they want him to go off baby ASA on Sunday, 5 days prior to an ablasion procedure. Please contact caller today with any feedback related to this or the one elevated reading he got on BP. He does not feel at this time he needs to come in to be seen. Asymptomatic. Disagree/Comply: Comply

## 2015-01-12 NOTE — Telephone Encounter (Signed)
Please advise if intervention is needed

## 2015-01-15 ENCOUNTER — Other Ambulatory Visit: Payer: Self-pay | Admitting: Family Medicine

## 2015-01-15 NOTE — Telephone Encounter (Signed)
Spoke with pt, Aware of dr crenshaw's recommendations.  °

## 2015-01-15 NOTE — Telephone Encounter (Signed)
Spoke to pt, told him Dr. Elease Hashimoto said for you to monitor blood pressure for now and if consistently greater than 140/90 please call office. Pt verbalized understanding.

## 2015-01-22 ENCOUNTER — Ambulatory Visit: Payer: BLUE CROSS/BLUE SHIELD | Admitting: Family Medicine

## 2015-01-22 ENCOUNTER — Encounter (INDEPENDENT_AMBULATORY_CARE_PROVIDER_SITE_OTHER): Payer: Self-pay

## 2015-01-26 ENCOUNTER — Ambulatory Visit (INDEPENDENT_AMBULATORY_CARE_PROVIDER_SITE_OTHER): Payer: BLUE CROSS/BLUE SHIELD | Admitting: Family Medicine

## 2015-01-26 ENCOUNTER — Encounter: Payer: Self-pay | Admitting: Family Medicine

## 2015-01-26 VITALS — BP 118/73 | HR 64 | Temp 98.3°F | Ht 74.0 in | Wt 205.0 lb

## 2015-01-26 DIAGNOSIS — J019 Acute sinusitis, unspecified: Secondary | ICD-10-CM

## 2015-01-26 DIAGNOSIS — N451 Epididymitis: Secondary | ICD-10-CM

## 2015-01-26 MED ORDER — LEVOFLOXACIN 500 MG PO TABS: 500.0000 mg | ORAL_TABLET | Freq: Every day | ORAL | Status: AC

## 2015-01-26 NOTE — Progress Notes (Signed)
Pre visit review using our clinic review tool, if applicable. No additional management support is needed unless otherwise documented below in the visit note. 

## 2015-01-26 NOTE — Progress Notes (Signed)
   Subjective:    Patient ID: Peter Snyder, male    DOB: June 21, 1955, 59 y.o.   MRN: [id number]  HPI Here for 2 problems. First about 2 weeks ago he developed a mild intermittent pain in the left testicle area which is worse if he does a lot of walking. No swellings or lumps. No trouble urinating. Also he has had 3 days of sinus pressure, HA, blowing green mucus from the nose and a dry cough. Using Mucinex.    Review of Systems  Constitutional: Negative.   HENT: Positive for congestion, postnasal drip and sinus pressure. Negative for ear pain and sore throat.   Eyes: Negative.   Respiratory: Positive for cough.   Genitourinary: Positive for testicular pain. Negative for scrotal swelling.       Objective:   Physical Exam  Constitutional: He appears well-developed and well-nourished. No distress.  HENT:  Right Ear: External ear normal.  Left Ear: External ear normal.  Nose: Nose normal.  Mouth/Throat: Oropharynx is clear and moist.  Eyes: Conjunctivae are normal.  Neck: No thyromegaly present.  Cardiovascular: Normal rate, regular rhythm, normal heart sounds and intact distal pulses.   Pulmonary/Chest: Effort normal and breath sounds normal.  Genitourinary:  No inguinal hernias. The left epidiymis is mildly tender. No masses.   Lymphadenopathy:    He has no cervical adenopathy.          Assessment & Plan:  Treat the sinusitis with Levaquin. He as a mild epididymitis and the Levaquin should cover this as well.

## 2015-02-05 ENCOUNTER — Ambulatory Visit (INDEPENDENT_AMBULATORY_CARE_PROVIDER_SITE_OTHER): Payer: BLUE CROSS/BLUE SHIELD | Admitting: Family Medicine

## 2015-02-05 ENCOUNTER — Encounter: Payer: Self-pay | Admitting: Family Medicine

## 2015-02-05 VITALS — BP 112/67 | HR 62 | Temp 98.7°F | Ht 74.0 in | Wt 206.0 lb

## 2015-02-05 DIAGNOSIS — J019 Acute sinusitis, unspecified: Secondary | ICD-10-CM

## 2015-02-05 MED ORDER — BENZONATATE 200 MG PO CAPS: 200.0000 mg | ORAL_CAPSULE | Freq: Two times a day (BID) | ORAL | Status: DC | PRN

## 2015-02-05 MED ORDER — AMOXICILLIN-POT CLAVULANATE ER 1000-62.5 MG PO TB12: 2.0000 | ORAL_TABLET | Freq: Two times a day (BID) | ORAL | Status: DC

## 2015-02-05 NOTE — Progress Notes (Signed)
   Subjective:    Patient ID: Peter Snyder, male    DOB: July 08, 1955, 59 y.o.   MRN: [id number]  HPI Here for continued symptoms of sinus pressure, PND, and a dry cough. He just finished a course of Levaquin and this helped, but he is not well yet. He was having some testicular discomfort as well, but this has resolved.    Review of Systems  Constitutional: Negative.   HENT: Positive for congestion and postnasal drip.   Eyes: Negative.   Respiratory: Positive for cough.        Objective:   Physical Exam  Constitutional: He appears well-developed and well-nourished.  HENT:  Right Ear: External ear normal.  Left Ear: External ear normal.  Nose: Nose normal.  Mouth/Throat: Oropharynx is clear and moist.  Eyes: Conjunctivae are normal.  Neck: No thyromegaly present.  Pulmonary/Chest: Effort normal and breath sounds normal. No respiratory distress. He has no wheezes. He has no rales.  Lymphadenopathy:    He has no cervical adenopathy.          Assessment & Plan:  Partially treated sinusitis, treat with 14 days of Augmentin XR.

## 2015-02-05 NOTE — Progress Notes (Signed)
Pre visit review using our clinic review tool, if applicable. No additional management support is needed unless otherwise documented below in the visit note. 

## 2015-02-12 ENCOUNTER — Telehealth: Payer: Self-pay | Admitting: Family Medicine

## 2015-02-12 NOTE — Telephone Encounter (Signed)
[facility]  Patient Name: Peter Snyder  DOB: [birth year]    Initial Comment Caller states he has been on 2 abx for sinus infection, now stomach acting up.   Nurse Assessment  Nurse: Harlow Mares, RN, Suanne Marker Date/Time Eilene Ghazi Time): 02/12/2015 1:15:30 PM  Confirm and document reason for call. If symptomatic, describe symptoms. ---Caller states he has been on 2 abx for sinus infection, now stomach acting up. Reports that he has had nausea since yesterday and diarrhea since this morning. Completed Levaquin and is now taking Augmentin. Reports sinus symptoms are much better.  Has the patient traveled out of the country within the last 30 days? ---No  Does the patient have any new or worsening symptoms? ---Yes  Will a triage be completed? ---Yes  Related visit to physician within the last 2 weeks? ---Yes  Does the PT have any chronic conditions? (i.e. diabetes, asthma, etc.) ---Yes  List chronic conditions. ---hypertension; CAD;  Is this a behavioral health call? ---No     Guidelines    Guideline Title Affirmed Question Affirmed Notes  Diarrhea MILD-MODERATE diarrhea (e.g., 1-6 times / day more than normal) (all triage questions negative)    Final Disposition User   [place], RN, Suanne Marker    Disagree/Comply: Comply

## 2015-02-12 NOTE — Telephone Encounter (Signed)
If he develops more severe diarrhea (eg 4+stools per day)- or any fever, bloody stools, abdominal pain would need follow up to rule out C dif-o/w observe for now.  Consider OTC probiotic such as Activia.

## 2015-02-12 NOTE — Telephone Encounter (Signed)
Would like to continue monitoring, call something in, or schedule appointment?

## 2015-02-14 NOTE — Telephone Encounter (Signed)
Patient is aware. States he has already started [place] and has found relief. Patient also states he stopped taking the Augmentin and the diarrhea has subsided. He is feeling better and knows to call our office if symptoms begin.

## 2015-02-15 ENCOUNTER — Telehealth: Payer: Self-pay | Admitting: Family Medicine

## 2015-02-15 NOTE — Telephone Encounter (Signed)
Mr. Glisan called saying on Monday he took four Lamodil (sp?). Now he's wondering what else he can do to begin having bowel movements again. He takes two Matamusil tablets at night and is taking a probiotic but it's not helping. He'd like a phone call regarding this.  Pt ph# [phone] Thank you.

## 2015-02-15 NOTE — Telephone Encounter (Signed)
Pt was battling with episodes of diarrhea on Monday which has now resolved. He had a small BM this morning but feels that he still should go more. He is currently eating activia, using probiotic and metamucil. I advised to continue with probiotic to help regulate his BMs. He will increase his water intake. Denies any abdominal pain. He will call back if he has any worsening sx.

## 2015-02-19 ENCOUNTER — Other Ambulatory Visit (INDEPENDENT_AMBULATORY_CARE_PROVIDER_SITE_OTHER): Payer: BLUE CROSS/BLUE SHIELD

## 2015-02-19 DIAGNOSIS — E785 Hyperlipidemia, unspecified: Secondary | ICD-10-CM | POA: Diagnosis not present

## 2015-02-19 DIAGNOSIS — R7303 Prediabetes: Secondary | ICD-10-CM | POA: Diagnosis not present

## 2015-02-19 LAB — BASIC METABOLIC PANEL
BUN: 13 mg/dL (ref 6–23)
CHLORIDE: 103 meq/L (ref 96–112)
CO2: 32 meq/L (ref 19–32)
CREATININE: 1.09 mg/dL (ref 0.40–1.50)
Calcium: 9.5 mg/dL (ref 8.4–10.5)
GFR: 73.4 mL/min (ref >60.00)
GLUCOSE: 117 mg/dL — AB (ref 70–99)
POTASSIUM: 4.6 meq/L (ref 3.5–5.1)
Sodium: 142 mEq/L (ref 135–145)

## 2015-02-19 LAB — HEPATIC FUNCTION PANEL
ALT: 17 U/L (ref 0–53)
AST: 16 U/L (ref 0–37)
Albumin: 4.3 g/dL (ref 3.5–5.2)
Alkaline Phosphatase: 53 U/L (ref 39–117)
BILIRUBIN TOTAL: 0.5 mg/dL (ref 0.2–1.2)
Bilirubin, Direct: 0.1 mg/dL (ref 0.0–0.3)
TOTAL PROTEIN: 6.8 g/dL (ref 6.0–8.3)

## 2015-02-19 LAB — LIPID PANEL
CHOLESTEROL: 139 mg/dL (ref 0–200)
HDL: 38.8 mg/dL — ABNORMAL LOW (ref >39.00)
LDL CALC: 75 mg/dL (ref 0–99)
NonHDL: 99.84
Total CHOL/HDL Ratio: 4
Triglycerides: 125 mg/dL (ref 0.0–149.0)
VLDL: 25 mg/dL (ref 0.0–40.0)

## 2015-02-21 ENCOUNTER — Encounter: Payer: Self-pay | Admitting: Family Medicine

## 2015-02-21 ENCOUNTER — Ambulatory Visit (INDEPENDENT_AMBULATORY_CARE_PROVIDER_SITE_OTHER): Payer: BLUE CROSS/BLUE SHIELD | Admitting: Family Medicine

## 2015-02-21 VITALS — BP 120/80 | HR 65 | Temp 97.5°F | Resp 14 | Ht 74.0 in | Wt 207.1 lb

## 2015-02-21 DIAGNOSIS — E785 Hyperlipidemia, unspecified: Secondary | ICD-10-CM

## 2015-02-21 DIAGNOSIS — R7303 Prediabetes: Secondary | ICD-10-CM

## 2015-02-21 DIAGNOSIS — I10 Essential (primary) hypertension: Secondary | ICD-10-CM | POA: Diagnosis not present

## 2015-02-21 DIAGNOSIS — N400 Enlarged prostate without lower urinary tract symptoms: Secondary | ICD-10-CM

## 2015-02-21 DIAGNOSIS — K219 Gastro-esophageal reflux disease without esophagitis: Secondary | ICD-10-CM | POA: Diagnosis not present

## 2015-02-21 NOTE — Progress Notes (Signed)
Pre visit review using our clinic review tool, if applicable. No additional management support is needed unless otherwise documented below in the visit note. 

## 2015-02-21 NOTE — Progress Notes (Signed)
   Subjective:    Patient ID: Peter Snyder, male    DOB: [birth year], 59 y.o.   MRN: [id number]  HPI Patient here for medical follow-up  History of prediabetes. He walks regularly. Watches his sugars and starch intake fairly closely. Never diagnosed with diabetes. Recent fasting blood sugar 117.  Hypertension. Medications reviewed and compliant with all. No headaches. No dizziness. No recent chest pains. He has history of CAD and is followed by cardiology. He has history of systolic dysfunction and echocardiogram last year EF 45-50%. He is walking 35-40 minutes per day including hills without difficulty.  Long history of GERD. Controlled with pantoprazole. No recent dysphagia.  BPH. Takes Flomax. Minimal nocturia. Denies any obstructive urinary symptoms.  Dyslipidemia treated with high-dose Lipitor and also takes Zetia.  Denies any myalgias.  Past Medical History  Diagnosis Date  . Arthritis   . Chicken pox   . Depression   . GERD (gastroesophageal reflux disease)   . CAD (coronary artery disease) 6/14    Cardiac catheterization 6/27/ 2014 ejection fraction 35-40%, 30% proximal left circumflex, 100% tiny obtuse marginal 1 with collaterals, 50% LAD, 50% D1, 100% RCA with collaterals  . Hypertension   . Hyperlipidemia   . Kidney stone   . Colon polyps   . Diabetes mellitus ([place])     Diet controlled   Past Surgical History  Procedure Laterality Date  . Tonsillectomy  1963    reports that he has never smoked. He does not have any smokeless tobacco history on file. He reports that he does not drink alcohol or use illicit drugs. family history includes Arthritis in his father and paternal grandmother; Heart disease in his maternal grandmother; Hyperlipidemia in his maternal grandmother. Allergies  Allergen Reactions  . Sulfa Antibiotics Rash      Review of Systems  Constitutional: Negative for appetite change, fatigue and unexpected weight change.  Eyes: Negative for visual  disturbance.  Respiratory: Negative for cough, chest tightness and shortness of breath.   Cardiovascular: Negative for chest pain, palpitations and leg swelling.  Gastrointestinal: Negative for abdominal pain.  Endocrine: Negative for polydipsia and polyuria.  Genitourinary: Negative for dysuria.  Neurological: Negative for dizziness, syncope, weakness, light-headedness and headaches.  Psychiatric/Behavioral: Negative for dysphoric mood.       Objective:   Physical Exam  Constitutional: He is oriented to person, place, and time. He appears well-developed and well-nourished.  HENT:  Right Ear: External ear normal.  Left Ear: External ear normal.  Mouth/Throat: Oropharynx is clear and moist.  Eyes: Pupils are equal, round, and reactive to light.  Neck: Neck supple. No thyromegaly present.  Cardiovascular: Normal rate and regular rhythm.   Pulmonary/Chest: Effort normal and breath sounds normal. No respiratory distress. He has no wheezes. He has no rales.  Musculoskeletal: He exhibits no edema.  Neurological: He is alert and oriented to person, place, and time.  Psychiatric: He has a normal mood and affect. His behavior is normal.          Assessment & Plan:  #1 hypertension stable and at goal. Continue current medications #2 GERD symptom medically stable on pantoprazole #3 history prediabetes. Dietary factors discussed. Continue regular exercise habits and reduction of white starches and sugars. Consider A1c at follow-up #4 dyslipidemia. Recent lipids reviewed with patient. Stable and slightly improved compared with last year. LDL near goal at 75 #5 BPH symptomatically stable on Flomax

## 2015-02-23 ENCOUNTER — Other Ambulatory Visit (INDEPENDENT_AMBULATORY_CARE_PROVIDER_SITE_OTHER): Payer: BLUE CROSS/BLUE SHIELD

## 2015-02-23 ENCOUNTER — Encounter: Payer: Self-pay | Admitting: Cardiology

## 2015-02-23 ENCOUNTER — Telehealth: Payer: Self-pay | Admitting: Family Medicine

## 2015-02-23 DIAGNOSIS — Z125 Encounter for screening for malignant neoplasm of prostate: Secondary | ICD-10-CM

## 2015-02-23 LAB — PSA: PSA: 1.26 ng/mL (ref 0.10–4.00)

## 2015-02-23 NOTE — Telephone Encounter (Signed)
Last PSA was checked on 03/09/2015. Please advise.

## 2015-02-23 NOTE — Telephone Encounter (Signed)
Pt had blood work and psa was not check. Can I sch PSA only?

## 2015-02-23 NOTE — Telephone Encounter (Signed)
Order entered pt is aware to call back to set up appt.

## 2015-02-23 NOTE — Telephone Encounter (Signed)
Yes.  May add PSA.

## 2015-03-01 ENCOUNTER — Other Ambulatory Visit: Payer: Self-pay | Admitting: Family Medicine

## 2015-03-15 ENCOUNTER — Encounter: Payer: Self-pay | Admitting: Internal Medicine

## 2015-03-15 ENCOUNTER — Ambulatory Visit (INDEPENDENT_AMBULATORY_CARE_PROVIDER_SITE_OTHER): Payer: BLUE CROSS/BLUE SHIELD | Admitting: Internal Medicine

## 2015-03-15 VITALS — BP 130/82 | HR 77 | Temp 98.7°F | Resp 14 | Ht 74.0 in | Wt 207.0 lb

## 2015-03-15 DIAGNOSIS — J069 Acute upper respiratory infection, unspecified: Secondary | ICD-10-CM

## 2015-03-15 MED ORDER — HYDROCODONE-HOMATROPINE 5-1.5 MG/5ML PO SYRP: 5.0000 mL | ORAL_SOLUTION | Freq: Three times a day (TID) | ORAL | Status: DC | PRN

## 2015-03-15 NOTE — Patient Instructions (Signed)
We have sent in a cough syrup to help with the cough. You can use it up to 3 times per day. It can make you sleepy or drowsy and we recommend not driving after taking.   This is likely a virus and should pass within 10 days from the start. The cough and drainage can last 2-3 weeks after the worst is over.   Upper Respiratory Infection, Adult Most upper respiratory infections (URIs) are a viral infection of the air passages leading to the lungs. A URI affects the nose, throat, and upper air passages. The most common type of URI is nasopharyngitis and is typically referred to as "the common cold." URIs run their course and usually go away on their own. Most of the time, a URI does not require medical attention, but sometimes a bacterial infection in the upper airways can follow a viral infection. This is called a secondary infection. Sinus and middle ear infections are common types of secondary upper respiratory infections. Bacterial pneumonia can also complicate a URI. A URI can worsen asthma and chronic obstructive pulmonary disease (COPD). Sometimes, these complications can require emergency medical care and may be life threatening.  CAUSES Almost all URIs are caused by viruses. A virus is a type of germ and can spread from one person to another.  RISKS FACTORS You may be at risk for a URI if:   You smoke.   You have chronic heart or lung disease.  You have a weakened defense (immune) system.   You are very young or very old.   You have nasal allergies or asthma.  You work in crowded or poorly ventilated areas.  You work in health care facilities or schools. SIGNS AND SYMPTOMS  Symptoms typically develop 2-3 days after you come in contact with a cold virus. Most viral URIs last 7-10 days. However, viral URIs from the influenza virus (flu virus) can last 14-18 days and are typically more severe. Symptoms may include:   Runny or stuffy (congested) nose.   Sneezing.   Cough.    Sore throat.   Headache.   Fatigue.   Fever.   Loss of appetite.   Pain in your forehead, behind your eyes, and over your cheekbones (sinus pain).  Muscle aches.  DIAGNOSIS  Your health care provider may diagnose a URI by:  Physical exam.  Tests to check that your symptoms are not due to another condition such as:  Strep throat.  Sinusitis.  Pneumonia.  Asthma. TREATMENT  A URI goes away on its own with time. It cannot be cured with medicines, but medicines may be prescribed or recommended to relieve symptoms. Medicines may help:  Reduce your fever.  Reduce your cough.  Relieve nasal congestion. HOME CARE INSTRUCTIONS   Take medicines only as directed by your health care provider.   Gargle warm saltwater or take cough drops to comfort your throat as directed by your health care provider.  Use a warm mist humidifier or inhale steam from a shower to increase air moisture. This may make it easier to breathe.  Drink enough fluid to keep your urine clear or pale yellow.   Eat soups and other clear broths and maintain good nutrition.   Rest as needed.   Return to work when your temperature has returned to normal or as your health care provider advises. You may need to stay home longer to avoid infecting others. You can also use a face mask and careful hand washing to prevent spread  of the virus.  Increase the usage of your inhaler if you have asthma.   Do not use any tobacco products, including cigarettes, chewing tobacco, or electronic cigarettes. If you need help quitting, ask your health care provider. PREVENTION  The best way to protect yourself from getting a cold is to practice good hygiene.   Avoid oral or hand contact with people with cold symptoms.   Wash your hands often if contact occurs.  There is no clear evidence that vitamin C, vitamin E, echinacea, or exercise reduces the chance of developing a cold. However, it is always  recommended to get plenty of rest, exercise, and practice good nutrition.  SEEK MEDICAL CARE IF:   You are getting worse rather than better.   Your symptoms are not controlled by medicine.   You have chills.  You have worsening shortness of breath.  You have brown or red mucus.  You have yellow or brown nasal discharge.  You have pain in your face, especially when you bend forward.  You have a fever.  You have swollen neck glands.  You have pain while swallowing.  You have white areas in the back of your throat. SEEK IMMEDIATE MEDICAL CARE IF:   You have severe or persistent:  Headache.  Ear pain.  Sinus pain.  Chest pain.  You have chronic lung disease and any of the following:  Wheezing.  Prolonged cough.  Coughing up blood.  A change in your usual mucus.  You have a stiff neck.  You have changes in your:  Vision.  Hearing.  Thinking.  Mood. MAKE SURE YOU:   Understand these instructions.  Will watch your condition.  Will get help right away if you are not doing well or get worse.   This information is not intended to replace advice given to you by your health care provider. Make sure you discuss any questions you have with your health care provider.   Document Released: 08/27/2000 Document Revised: 07/18/2014 Document Reviewed: 06/08/2013 Elsevier Interactive Patient Education [employer].

## 2015-03-15 NOTE — Progress Notes (Signed)
Pre visit review using our clinic review tool, if applicable. No additional management support is needed unless otherwise documented below in the visit note. 

## 2015-03-16 DIAGNOSIS — J069 Acute upper respiratory infection, unspecified: Secondary | ICD-10-CM | POA: Insufficient documentation

## 2015-03-16 NOTE — Progress Notes (Signed)
   Subjective:    Patient ID: Peter Snyder, male    DOB: 1955-09-15, 59 y.o.   MRN: [id number]  HPI The patient is a 59 YO man coming in for cold and cough. He started having symptoms about 4-5 days ago. Went to urgent care and they gave him antibiotics which he started taking. He did not improve so went back to urgent care the following day and demanded a chest x-ray which was clear. He comes in today with similar symptoms. They might be a little better. He is coughing some and they gave him tessalon perles which did not help. No fevers or chills. Mild nasal drainage.   Review of Systems  Constitutional: Negative for fever, activity change, appetite change and fatigue.  HENT: Positive for congestion and rhinorrhea. Negative for ear discharge, ear pain and postnasal drip.   Eyes: Negative.   Respiratory: Positive for cough. Negative for chest tightness, shortness of breath and wheezing.   Cardiovascular: Negative.   Gastrointestinal: Negative.       Objective:   Physical Exam  Constitutional: He appears well-developed and well-nourished.  HENT:  Head: Normocephalic and atraumatic.  Right Ear: External ear normal.  Left Ear: External ear normal.  Mild erythema oropharynx and clear drainage, nasal turbinates swollen, no crusting.   Eyes: EOM are normal.  Neck: No JVD present.  Cardiovascular: Normal rate and regular rhythm.   Pulmonary/Chest: Effort normal and breath sounds normal.  Abdominal: Soft. He exhibits no distension. There is no tenderness.  Musculoskeletal: He exhibits no edema.  Lymphadenopathy:    He has no cervical adenopathy.  Skin: Skin is warm and dry.   Filed Vitals:   03/15/15 1522  BP: 130/82  Pulse: 77  Temp: 98.7 F (37.1 C)  TempSrc: Oral  Resp: 14  Height: 6\' 2"  (1.88 m)  Weight: 207 lb (93.895 kg)  SpO2: 99%      Assessment & Plan:

## 2015-03-16 NOTE — Assessment & Plan Note (Signed)
Given cough syrup for symptomatic relief. Advised that he likely did not need the antibiotics he was given but he elects to complete the course. Lungs clear and no need for further investigation or treatment at this time.

## 2015-03-22 ENCOUNTER — Telehealth: Payer: Self-pay | Admitting: Family Medicine

## 2015-03-22 NOTE — Telephone Encounter (Signed)
Patient Name: Peter Snyder  DOB: June 07, 1955    Initial Comment Caller States has sinus infection and upper respiratory infection, went to UC. put on meds. has been coughing a lot and today had some blood in it.    Nurse Assessment  Nurse: Orvan Seen, RN, Jacquilin Date/Time (Eastern Time): 03/22/2015 8:19:48 AM  Confirm and document reason for call. If symptomatic, describe symptoms. ---Caller States has sinus infection and upper respiratory infection, went to UC. put on meds. has been coughing a lot and today had some blood in it (small amount)- Caller has been seen 3X for the issue since it started.  Has the patient traveled out of the country within the last 30 days? ---Not Applicable  Does the patient have any new or worsening symptoms? ---Yes  Will a triage be completed? ---Yes  Related visit to physician within the last 2 weeks? ---Yes  Does the PT have any chronic conditions? (i.e. diabetes, asthma, etc.) ---No  Is this a behavioral health or substance abuse call? ---No     Guidelines    Guideline Title Affirmed Question Affirmed Notes  Cough - Acute Productive SEVERE coughing spells (e.g., whooping sound after coughing, vomiting after coughing)    Final Disposition User   See Physician within 24 Hours Atkins, RN, Jacquilin    Comments  Spoke with [employer] in the office she states they have nothing for the patient to be seen today Patient states he will take the appointment for Friday at 1530 but may go ahead and go to the UC   Referrals  REFERRED TO PCP OFFICE   Disagree/Comply: Comply

## 2015-03-22 NOTE — Telephone Encounter (Signed)
noted 

## 2015-03-23 ENCOUNTER — Ambulatory Visit: Payer: Self-pay | Admitting: Family Medicine

## 2015-04-02 NOTE — Progress Notes (Signed)
HPI: FU coronary artery disease. Previous cardiac care in [place]. Echocardiogram in June 2014 showed an ejection fraction of 55%. Nuclear study in June of 2014 showed an ejection fraction of 43%, inferior and anterior defect and left ventricular dilatation with exercise. Cardiac catheterization performed in June of 2014 showed an ejection fraction of 35-40%. The inferior wall is akinetic. The left main was normal. There was a 30% proximal circumflex, occluded small first obtuse marginal with collaterals, a 50% ramus intermedius, 50% LAD, 50% first diagonal and an occluded right coronary artery with collaterals. Treated medically. Echocardiogram repeated in January of 2015. Ejection fraction 45-50%. Grade 1 diastolic dysfunction. Mild left atrial enlargement and mild mitral regurgitation. Siince last seen, The patient has noticed on 3 separate occasions after eating a burning sensation in his chest with walking that resolves with rest. The majority of times he walks he notices no symptoms. He denies dyspnea, palpitations or syncope.  Current Outpatient Prescriptions  Medication Sig Dispense Refill  . acetaminophen (TYLENOL) 500 MG tablet Take 500 mg by mouth every 6 (six) hours as needed for moderate pain.    Marland Kitchen ALPRAZolam (XANAX) 0.25 MG tablet Take 0.25 mg by mouth daily.     Marland Kitchen aspirin 81 MG tablet Take 81 mg by mouth daily.    Marland Kitchen atorvastatin (LIPITOR) 80 MG tablet TAKE 1 TABLET BY MOUTH EVERY DAY 90 tablet 3  . carvedilol (COREG) 12.5 MG tablet Take 1 tablet (12.5 mg total) by mouth 2 (two) times daily with a meal. 270 tablet 2  . celecoxib (CELEBREX) 200 MG capsule Take 200 mg by mouth daily.     . clonazePAM (KLONOPIN) 0.5 MG tablet Take 0.5 mg by mouth at bedtime.     . Diphenhyd-Hydrocort-Nystatin (FIRST-DUKES MOUTHWASH) SUSP Use as directed 1 application in the mouth or throat 4 (four) times daily as needed. 237 mL 2  . escitalopram (LEXAPRO) 10 MG tablet Take 10 mg by mouth daily.    .  fish oil-omega-3 fatty acids 1000 MG capsule Take 2 g by mouth daily.    . fluticasone (FLONASE) 50 MCG/ACT nasal spray Place 2 sprays into both nostrils daily. 16 g 6  . gabapentin (NEURONTIN) 600 MG tablet TAKE 1 TABLET (600 MG TOTAL) BY MOUTH 3 (THREE) TIMES DAILY. 270 tablet 3  . lisinopril (PRINIVIL,ZESTRIL) 10 MG tablet Take 1 tablet (10 mg total) by mouth daily. 90 tablet 3  . LOCOID LIPOCREAM 0.1 % CREA Apply 1 application topically daily. Reported on 03/15/2015    . mupirocin ointment (BACTROBAN) 2 % Place 1 application into the nose 2 (two) times daily. 22 g 0  . pantoprazole (PROTONIX) 40 MG tablet TAKE 1 TABLET (40 MG TOTAL) BY MOUTH DAILY. 90 tablet 1  . RESTASIS 0.05 % ophthalmic emulsion Place 1 drop into both eyes 2 (two) times daily.     . tamsulosin (FLOMAX) 0.4 MG CAPS capsule TAKE ONE CAPSULE BY MOUTH 1/2 HOUR HOUR FOLLOWING THE SAME MEAL EACH DAY 30 capsule 11  . VOLTAREN 1 % GEL Apply 2 g topically daily.     Marland Kitchen ZETIA 10 MG tablet TAKE 1 TABLET BY MOUTH EVERY DAY 30 tablet 5  . zolpidem (AMBIEN) 10 MG tablet Take 10 mg by mouth at bedtime as needed for sleep.    Marland Kitchen ZOVIRAX 5 % Take 1 application by mouth 4 (four) times daily - after meals and at bedtime.   0   No current facility-administered medications for this visit.  Past Medical History  Diagnosis Date  . Arthritis   . Chicken pox   . Depression   . GERD (gastroesophageal reflux disease)   . CAD (coronary artery disease) 6/14    Cardiac catheterization 6/27/ 2014 ejection fraction 35-40%, 30% proximal left circumflex, 100% tiny obtuse marginal 1 with collaterals, 50% LAD, 50% D1, 100% RCA with collaterals  . Hypertension   . Hyperlipidemia   . Kidney stone   . Colon polyps   . Diabetes mellitus ([place])     Diet controlled    Past Surgical History  Procedure Laterality Date  . Tonsillectomy  1963    Social History   Social History  . Marital Status: Married    Spouse Name: N/A  . Number of  Children: 3  . Years of Education: N/A   Occupational History  .      Retired   Social History Main Topics  . Smoking status: Never Smoker   . Smokeless tobacco: Not on file  . Alcohol Use: No  . Drug Use: No  . Sexual Activity: Not on file   Other Topics Concern  . Not on file   Social History Narrative    Family History  Problem Relation Age of Onset  . Arthritis Father   . Hyperlipidemia Maternal Grandmother   . Heart disease Maternal Grandmother   . Arthritis Paternal Grandmother     ROS: no fevers or chills, productive cough, hemoptysis, dysphasia, odynophagia, melena, hematochezia, dysuria, hematuria, rash, seizure activity, orthopnea, PND, pedal edema, claudication. Remaining systems are negative.  Physical Exam: Well-developed well-nourished in no acute distress.  Skin is warm and dry.  HEENT is normal.  Neck is supple.  Chest is clear to auscultation with normal expansion.  Cardiovascular exam is regular rate and rhythm.  Abdominal exam nontender or distended. No masses palpated. Extremities show no edema. neuro grossly intact  Electrocardiogram 12/20/2014 showed sinus rhythm with prior inferior infarct.   Today's electrocardiogram shows sinus rhythm with prior inferior infarct. Incomplete right bundle branch block.

## 2015-04-03 ENCOUNTER — Ambulatory Visit (INDEPENDENT_AMBULATORY_CARE_PROVIDER_SITE_OTHER): Payer: BLUE CROSS/BLUE SHIELD | Admitting: Cardiology

## 2015-04-03 ENCOUNTER — Encounter: Payer: Self-pay | Admitting: *Deleted

## 2015-04-03 ENCOUNTER — Encounter: Payer: Self-pay | Admitting: Cardiology

## 2015-04-03 VITALS — BP 100/60 | HR 70 | Ht 74.5 in | Wt 208.6 lb

## 2015-04-03 DIAGNOSIS — I10 Essential (primary) hypertension: Secondary | ICD-10-CM | POA: Diagnosis not present

## 2015-04-03 DIAGNOSIS — I2510 Atherosclerotic heart disease of native coronary artery without angina pectoris: Secondary | ICD-10-CM

## 2015-04-03 DIAGNOSIS — E785 Hyperlipidemia, unspecified: Secondary | ICD-10-CM | POA: Diagnosis not present

## 2015-04-03 DIAGNOSIS — I255 Ischemic cardiomyopathy: Secondary | ICD-10-CM | POA: Diagnosis not present

## 2015-04-03 NOTE — Assessment & Plan Note (Signed)
Patient is complaining of occasional burning chest pain with ambulation after eating. He does not notice the symptoms at other times. We will arrange a stress nuclear study for risk stratification. Continue aspirin and statin.

## 2015-04-03 NOTE — Assessment & Plan Note (Signed)
Blood pressure controlled. Continue present medications. 

## 2015-04-03 NOTE — Assessment & Plan Note (Signed)
Continue statin. 

## 2015-04-03 NOTE — Patient Instructions (Signed)
Medication Instructions:   NO CHANGE  Testing/Procedures:  Your physician has requested that you have en exercise stress myoview. For further information please visit [email or web address]. Please follow instruction sheet, as given.    Follow-Up:  Your physician wants you to follow-up in: 3 [place] will receive a reminder letter in the mail two months in advance. If you don't receive a letter, please call our office to schedule the follow-up appointment  If you need a refill on your cardiac medications before your next appointment, please call your pharmacy. Marland Kitchen

## 2015-04-03 NOTE — Assessment & Plan Note (Signed)
Continue ACE inhibitor and beta blocker. 

## 2015-04-04 ENCOUNTER — Telehealth (HOSPITAL_COMMUNITY): Payer: Self-pay

## 2015-04-04 ENCOUNTER — Ambulatory Visit (INDEPENDENT_AMBULATORY_CARE_PROVIDER_SITE_OTHER): Payer: BLUE CROSS/BLUE SHIELD | Admitting: Family Medicine

## 2015-04-04 ENCOUNTER — Encounter: Payer: Self-pay | Admitting: Family Medicine

## 2015-04-04 VITALS — BP 120/80 | HR 88 | Temp 98.1°F | Ht 74.5 in | Wt 206.3 lb

## 2015-04-04 DIAGNOSIS — R1013 Epigastric pain: Secondary | ICD-10-CM

## 2015-04-04 NOTE — Patient Instructions (Signed)
Increase Protonix to one twice daily for the next few days- until nuclear test.

## 2015-04-04 NOTE — Telephone Encounter (Signed)
Encounter complete. 

## 2015-04-04 NOTE — Progress Notes (Signed)
   Subjective:    Patient ID: Peter Snyder, male    DOB: 05/16/1955, 60 y.o.   MRN: [id number]  HPI Patient seen with some recent epigastric burning. He's had a few episodes where he ate fairly heavy meal and then went out walking afterwards and noticed some epigastric burning with some radiation into the substernal region. On a couple of occasions he had some relief with burping. Has never had any associated dyspnea. Saw cardiologist yesterday. EKG no acute findings. Scheduled for nuclear stress test Friday. No recent exertional tightness.  Currently takes Protonix daily and supplements with Zantac or Tagamet. Moderate caffeine consumption. No melena. No hematemesis.  Past Medical History  Diagnosis Date  . Arthritis   . Chicken pox   . Depression   . GERD (gastroesophageal reflux disease)   . CAD (coronary artery disease) 6/14    Cardiac catheterization 6/27/ 2014 ejection fraction 35-40%, 30% proximal left circumflex, 100% tiny obtuse marginal 1 with collaterals, 50% LAD, 50% D1, 100% RCA with collaterals  . Hypertension   . Hyperlipidemia   . Kidney stone   . Colon polyps   . Diabetes mellitus ([place])     Diet controlled   Past Surgical History  Procedure Laterality Date  . Tonsillectomy  1963    reports that he has never smoked. He does not have any smokeless tobacco history on file. He reports that he does not drink alcohol or use illicit drugs. family history includes Arthritis in his father and paternal grandmother; Heart disease in his maternal grandmother; Hyperlipidemia in his maternal grandmother. Allergies  Allergen Reactions  . Sulfa Antibiotics Rash      Review of Systems  Constitutional: Negative for fever and chills.  Respiratory: Negative for shortness of breath.   Cardiovascular: Positive for chest pain.  Gastrointestinal: Negative for nausea, vomiting and diarrhea.       Objective:   Physical Exam  Constitutional: He appears well-developed and  well-nourished.  Cardiovascular: Normal rate and regular rhythm.  Exam reveals no gallop.   Pulmonary/Chest: Effort normal and breath sounds normal. No respiratory distress. He has no wheezes. He has no rales.  Abdominal: Soft. Bowel sounds are normal. He exhibits no distension and no mass. There is no rebound and no guarding.  Minimally tender epigastric region          Assessment & Plan:  Recent epigastric burning. Suspect gastric related. Probably has some reflux though he is currently on Protonix. He has schedule nuclear stress test as above. In the meantime, increase Protonix to one twice daily. Discussed dietary modification. Gradually reduce caffeine use

## 2015-04-04 NOTE — Progress Notes (Signed)
Pre visit review using our clinic review tool, if applicable. No additional management support is needed unless otherwise documented below in the visit note. 

## 2015-04-05 ENCOUNTER — Telehealth (HOSPITAL_COMMUNITY): Payer: Self-pay

## 2015-04-05 NOTE — Telephone Encounter (Signed)
Encounter complete. 

## 2015-04-06 ENCOUNTER — Ambulatory Visit (HOSPITAL_COMMUNITY)
Admission: RE | Admit: 2015-04-06 | Discharge: 2015-04-06 | Disposition: A | Discharge status: Home / Self Care | Payer: BLUE CROSS/BLUE SHIELD | Source: Ambulatory Visit | Attending: Urology | Admitting: Urology

## 2015-04-06 DIAGNOSIS — I2510 Atherosclerotic heart disease of native coronary artery without angina pectoris: Secondary | ICD-10-CM | POA: Insufficient documentation

## 2015-04-06 DIAGNOSIS — Z8249 Family history of ischemic heart disease and other diseases of the circulatory system: Secondary | ICD-10-CM | POA: Diagnosis not present

## 2015-04-06 DIAGNOSIS — R002 Palpitations: Secondary | ICD-10-CM | POA: Diagnosis not present

## 2015-04-06 DIAGNOSIS — I4510 Unspecified right bundle-branch block: Secondary | ICD-10-CM | POA: Insufficient documentation

## 2015-04-06 DIAGNOSIS — R0789 Other chest pain: Secondary | ICD-10-CM | POA: Diagnosis not present

## 2015-04-06 DIAGNOSIS — R9439 Abnormal result of other cardiovascular function study: Secondary | ICD-10-CM | POA: Diagnosis not present

## 2015-04-06 DIAGNOSIS — I10 Essential (primary) hypertension: Secondary | ICD-10-CM | POA: Insufficient documentation

## 2015-04-06 DIAGNOSIS — Z87891 Personal history of nicotine dependence: Secondary | ICD-10-CM | POA: Insufficient documentation

## 2015-04-06 LAB — MYOCARDIAL PERFUSION IMAGING
CHL CUP MPHR: 161 {beats}/min
CHL CUP NUCLEAR SDS: 4
CHL CUP NUCLEAR SRS: 15
CHL CUP RESTING HR STRESS: 101 {beats}/min
CHL RATE OF PERCEIVED EXERTION: 15
CSEPHR: 104 %
Estimated workload: 10.1 METS
Exercise duration (min): 8 min
LV sys vol: 108 mL
LVDIAVOL: 165 mL
Peak HR: 169 {beats}/min
SSS: 19
TID: 1.39

## 2015-04-06 MED ORDER — TECHNETIUM TC 99M SESTAMIBI GENERIC - CARDIOLITE
10.5000 | Freq: Once | INTRAVENOUS | Status: AC | PRN
  Administered 2015-04-06: 10.8 via INTRAVENOUS

## 2015-04-06 MED ORDER — TECHNETIUM TC 99M SESTAMIBI GENERIC - CARDIOLITE
30.8000 | Freq: Once | INTRAVENOUS | Status: AC | PRN
  Administered 2015-04-06: 30.8 via INTRAVENOUS

## 2015-04-09 ENCOUNTER — Ambulatory Visit (HOSPITAL_COMMUNITY): Payer: BLUE CROSS/BLUE SHIELD | Attending: Cardiology

## 2015-04-09 ENCOUNTER — Other Ambulatory Visit: Payer: Self-pay

## 2015-04-09 ENCOUNTER — Telehealth: Payer: Self-pay | Admitting: Cardiology

## 2015-04-09 DIAGNOSIS — R9439 Abnormal result of other cardiovascular function study: Secondary | ICD-10-CM

## 2015-04-09 DIAGNOSIS — R931 Abnormal findings on diagnostic imaging of heart and coronary circulation: Secondary | ICD-10-CM | POA: Diagnosis not present

## 2015-04-09 DIAGNOSIS — I340 Nonrheumatic mitral (valve) insufficiency: Secondary | ICD-10-CM | POA: Insufficient documentation

## 2015-04-09 DIAGNOSIS — I517 Cardiomegaly: Secondary | ICD-10-CM | POA: Insufficient documentation

## 2015-04-09 DIAGNOSIS — I700 Atherosclerosis of aorta: Secondary | ICD-10-CM | POA: Diagnosis not present

## 2015-04-09 DIAGNOSIS — I351 Nonrheumatic aortic (valve) insufficiency: Secondary | ICD-10-CM | POA: Diagnosis not present

## 2015-04-09 NOTE — Telephone Encounter (Signed)
Spoke with pt, aware of nuclear results. Follow up scheduled for echo today

## 2015-04-09 NOTE — Telephone Encounter (Signed)
Pt is calling in to get the results to his stress test that was done on 1/20. Please f/u with him  Thanks

## 2015-04-11 ENCOUNTER — Ambulatory Visit (INDEPENDENT_AMBULATORY_CARE_PROVIDER_SITE_OTHER): Payer: BLUE CROSS/BLUE SHIELD | Admitting: Family Medicine

## 2015-04-11 DIAGNOSIS — R195 Other fecal abnormalities: Secondary | ICD-10-CM

## 2015-04-11 NOTE — Patient Instructions (Signed)
Reduce Protonix back to one daily.

## 2015-04-11 NOTE — Progress Notes (Signed)
   Subjective:    Patient ID: Peter Snyder, male    DOB: 1955-05-29, 60 y.o.   MRN: [id number]  HPI Patient here with 1 day history of diarrhea and some mild abdominal cramping. He thinks is maybe stress related. He had recent atypical chest symptoms and had nuclear stress test Friday. Low risk study but ejection fraction of 35%. He had subsequent echo on Monday with ejection fraction 55-60%. He has resumed walking since yesterday and has done well with that with no exertional symptoms whatsoever. We recently had bumped his Protonix up to 1 twice daily.  He's having some loose stools but no bloody stools and these were mostly yesterday but none today. He has occasional diffuse lower abdominal cramping. No vomiting. No nausea.  Past Medical History  Diagnosis Date  . Arthritis   . Chicken pox   . Depression   . GERD (gastroesophageal reflux disease)   . CAD (coronary artery disease) 6/14    Cardiac catheterization 6/27/ 2014 ejection fraction 35-40%, 30% proximal left circumflex, 100% tiny obtuse marginal 1 with collaterals, 50% LAD, 50% D1, 100% RCA with collaterals  . Hypertension   . Hyperlipidemia   . Kidney stone   . Colon polyps   . Diabetes mellitus ([place])     Diet controlled   Past Surgical History  Procedure Laterality Date  . Tonsillectomy  1963    reports that he has never smoked. He does not have any smokeless tobacco history on file. He reports that he does not drink alcohol or use illicit drugs. family history includes Arthritis in his father and paternal grandmother; Heart disease in his maternal grandmother; Hyperlipidemia in his maternal grandmother. Allergies  Allergen Reactions  . Sulfa Antibiotics Rash      Review of Systems  Constitutional: Negative for fever and chills.  Respiratory: Negative for shortness of breath.   Cardiovascular: Negative for chest pain.  Gastrointestinal: Negative for nausea, vomiting, abdominal pain and blood in stool.         Objective:   Physical Exam  Constitutional: He appears well-developed and well-nourished.  HENT:  Mouth/Throat: Oropharynx is clear and moist.  Cardiovascular: Normal rate and regular rhythm.   Pulmonary/Chest: Effort normal and breath sounds normal. No respiratory distress. He has no wheezes. He has no rales.  Abdominal: Soft. Bowel sounds are normal. He exhibits no distension and no mass. There is no tenderness. There is no rebound and no guarding.          Assessment & Plan:  Mild loose stools yesterday. Possibly stress related. Nonfocal exam. Reduce Protonix back to once daily

## 2015-04-11 NOTE — Progress Notes (Signed)
Pre visit review using our clinic review tool, if applicable. No additional management support is needed unless otherwise documented below in the visit note. 

## 2015-04-13 ENCOUNTER — Ambulatory Visit: Payer: BLUE CROSS/BLUE SHIELD | Admitting: Cardiology

## 2015-04-20 ENCOUNTER — Ambulatory Visit (INDEPENDENT_AMBULATORY_CARE_PROVIDER_SITE_OTHER): Payer: BLUE CROSS/BLUE SHIELD | Admitting: Family Medicine

## 2015-04-20 ENCOUNTER — Encounter: Payer: Self-pay | Admitting: Family Medicine

## 2015-04-20 VITALS — BP 110/80 | HR 86 | Temp 97.9°F | Ht 75.0 in | Wt 207.7 lb

## 2015-04-20 DIAGNOSIS — R0981 Nasal congestion: Secondary | ICD-10-CM

## 2015-04-20 NOTE — Patient Instructions (Signed)
Continue with mucinex twice daily Saline nasal spray use at least twice daily Consider humidifier for bedroom.   Leave off Flonase for now (secondary to alcohol which could be drying). Stay well hydrated.   Consider Nasacort AQ

## 2015-04-20 NOTE — Progress Notes (Signed)
   Subjective:    Patient ID: Peter Snyder, male    DOB: Sep 02, 1955, 60 y.o.   MRN: [id number]  HPI Patient seen for acute visit with some nasal mucosal irritation past few days. He thinks some of this may be related to dry air. He's not had any bloody nasal discharge. No colored nasal mucus. No headaches. Uses Flonase chronically. Has some occasional mild nosebleed recently. No fevers or chills. No cough.  Denies any localized facial pain over the sinuses. No visible facial swelling. No upper teeth pain  Past Medical History  Diagnosis Date  . Arthritis   . Chicken pox   . Depression   . GERD (gastroesophageal reflux disease)   . CAD (coronary artery disease) 6/14    Cardiac catheterization 6/27/ 2014 ejection fraction 35-40%, 30% proximal left circumflex, 100% tiny obtuse marginal 1 with collaterals, 50% LAD, 50% D1, 100% RCA with collaterals  . Hypertension   . Hyperlipidemia   . Kidney stone   . Colon polyps   . Diabetes mellitus ([place])     Diet controlled   Past Surgical History  Procedure Laterality Date  . Tonsillectomy  1963    reports that he has never smoked. He does not have any smokeless tobacco history on file. He reports that he does not drink alcohol or use illicit drugs. family history includes Arthritis in his father and paternal grandmother; Heart disease in his maternal grandmother; Hyperlipidemia in his maternal grandmother. Allergies  Allergen Reactions  . Sulfa Antibiotics Rash      Review of Systems  Constitutional: Negative for fever and chills.  HENT: Positive for congestion. Negative for sore throat.   Respiratory: Negative for cough.   Neurological: Negative for headaches.       Objective:   Physical Exam  Constitutional: He appears well-developed and well-nourished.  HENT:  Right Ear: External ear normal.  Left Ear: External ear normal.  Mouth/Throat: Oropharynx is clear and moist.  Nasal mucosa is erythematous and somewhat dry in  appearance. No active bleeding. No purulent secretions.  Neck: Neck supple.  Cardiovascular: Normal rate and regular rhythm.   Pulmonary/Chest: Effort normal and breath sounds normal. No respiratory distress. He has no wheezes. He has no rales.          Assessment & Plan:  Nasal congestion. Patient has some mucosal irritation which could be exacerbated by Flonase and dry air. Leave off Flonase. Saline nose spray several times daily for moisturizing effect. Consider Mucinex 1200 mg twice a day. Recommend humidifier for her bedroom.

## 2015-04-20 NOTE — Progress Notes (Signed)
Pre visit review using our clinic review tool, if applicable. No additional management support is needed unless otherwise documented below in the visit note. 

## 2015-04-25 ENCOUNTER — Telehealth: Payer: Self-pay | Admitting: Family Medicine

## 2015-04-25 MED ORDER — FLUTICASONE PROPIONATE 50 MCG/ACT NA SUSP: 2.0000 | Freq: Every day | NASAL | Status: DC

## 2015-04-25 NOTE — Telephone Encounter (Signed)
RX sent in

## 2015-04-25 NOTE — Telephone Encounter (Signed)
Okay to switch?  

## 2015-04-25 NOTE — Telephone Encounter (Signed)
OK to go back.  Flonase, of course, is OTC.

## 2015-04-25 NOTE — Telephone Encounter (Signed)
Pt would like to go back to Flonase instead of Nasacort. Pt symptoms are the same. Pt was dx sinus infection on 04-20-15 and now has sensitivity  in front teeth. Please advise  [facility] [address]

## 2015-04-26 ENCOUNTER — Telehealth: Payer: Self-pay | Admitting: Family Medicine

## 2015-04-26 NOTE — Telephone Encounter (Addendum)
Patient stated that he still has not herd anything about his sensitivity to his front teeth.

## 2015-04-26 NOTE — Telephone Encounter (Signed)
I would not recommend any antibiotics unless he has associated fever, progressive facial pain, purulent nasal secretions, etc.

## 2015-04-26 NOTE — Telephone Encounter (Signed)
Spoke with pt and gave him the information

## 2015-05-04 ENCOUNTER — Ambulatory Visit (INDEPENDENT_AMBULATORY_CARE_PROVIDER_SITE_OTHER): Payer: BLUE CROSS/BLUE SHIELD | Admitting: Family Medicine

## 2015-05-04 ENCOUNTER — Encounter: Payer: Self-pay | Admitting: Family Medicine

## 2015-05-04 VITALS — BP 102/80 | HR 78 | Temp 97.9°F | Ht 75.0 in | Wt 209.0 lb

## 2015-05-04 DIAGNOSIS — J019 Acute sinusitis, unspecified: Secondary | ICD-10-CM | POA: Diagnosis not present

## 2015-05-04 MED ORDER — AMOXICILLIN-POT CLAVULANATE 875-125 MG PO TABS: 1.0000 | ORAL_TABLET | Freq: Two times a day (BID) | ORAL | Status: DC

## 2015-05-04 NOTE — Progress Notes (Signed)
Pre visit review using our clinic review tool, if applicable. No additional management support is needed unless otherwise documented below in the visit note. 

## 2015-05-04 NOTE — Progress Notes (Signed)
   Subjective:    Patient ID: Peter Snyder, male    DOB: [birth year], 60 y.o.   MRN: [id number]  HPI Patient seen for acute visit. He has frequent rhinitis complaints. He has history of allergic rhinitis and regularly takes antihistamine and nasal steroid spray Over 2 week history of progressive sinus congestion and pain mostly frontal sinuses but also ethmoids Over 2 week history of bloody thick green-yellow nasal discharge Intermittent headaches and malaise. Still walking for exercise. Rare cough. No relief with nasal saline irrigation  Past Medical History  Diagnosis Date  . Arthritis   . Chicken pox   . Depression   . GERD (gastroesophageal reflux disease)   . CAD (coronary artery disease) 6/14    Cardiac catheterization 6/27/ 2014 ejection fraction 35-40%, 30% proximal left circumflex, 100% tiny obtuse marginal 1 with collaterals, 50% LAD, 50% D1, 100% RCA with collaterals  . Hypertension   . Hyperlipidemia   . Kidney stone   . Colon polyps   . Diabetes mellitus ([place])     Diet controlled   Past Surgical History  Procedure Laterality Date  . Tonsillectomy  1963    reports that he has never smoked. He does not have any smokeless tobacco history on file. He reports that he does not drink alcohol or use illicit drugs. family history includes Arthritis in his father and paternal grandmother; Heart disease in his maternal grandmother; Hyperlipidemia in his maternal grandmother. Allergies  Allergen Reactions  . Sulfa Antibiotics Rash      Review of Systems  Constitutional: Negative for fever and chills.  HENT: Positive for congestion and sinus pressure.   Respiratory: Positive for cough.   Neurological: Positive for headaches.       Objective:   Physical Exam  Constitutional: He appears well-developed and well-nourished.  HENT:  Right Ear: External ear normal.  Left Ear: External ear normal.  Mouth/Throat: Oropharynx is clear and moist.  Erythematous nasal mucosa  especially right naris with some crusted yellow drainage  Neck: Neck supple.  Cardiovascular: Normal rate and regular rhythm.   Pulmonary/Chest: Effort normal and breath sounds normal. No respiratory distress. He has no wheezes. He has no rales.  Lymphadenopathy:    He has no cervical adenopathy.          Assessment & Plan:  Acute sinusitis. Augmentin 875 mg twice daily for 10 days. Continue saline nasal irrigation. Follow-up as needed

## 2015-05-04 NOTE — Patient Instructions (Signed)

## 2015-05-09 ENCOUNTER — Telehealth: Payer: Self-pay | Admitting: Cardiology

## 2015-05-09 NOTE — Telephone Encounter (Signed)
Please call,pt says he is having problem with his blood pressure.

## 2015-05-09 NOTE — Telephone Encounter (Signed)
Spoke with pt, he noticed this am his bp was 140-150/80. He feels fine and this is the first time he has really checked his bp. Advised pt to track his bp for about one week and let us know if he is getting consistant high numbers. Pt agreed with this plan.

## 2015-05-18 ENCOUNTER — Ambulatory Visit (INDEPENDENT_AMBULATORY_CARE_PROVIDER_SITE_OTHER): Payer: BLUE CROSS/BLUE SHIELD | Admitting: Family Medicine

## 2015-05-18 ENCOUNTER — Encounter: Payer: Self-pay | Admitting: Family Medicine

## 2015-05-18 VITALS — BP 120/80 | HR 86 | Temp 97.5°F | Ht 75.0 in | Wt 210.0 lb

## 2015-05-18 DIAGNOSIS — N5082 Scrotal pain: Secondary | ICD-10-CM | POA: Diagnosis not present

## 2015-05-18 DIAGNOSIS — R350 Frequency of micturition: Secondary | ICD-10-CM

## 2015-05-18 LAB — POC URINALSYSI DIPSTICK (AUTOMATED)
Bilirubin, UA: NEGATIVE
GLUCOSE UA: NEGATIVE
KETONES UA: NEGATIVE
Leukocytes, UA: NEGATIVE
Nitrite, UA: NEGATIVE
Protein, UA: NEGATIVE
RBC UA: NEGATIVE
UROBILINOGEN UA: 0.2
pH, UA: 5.5

## 2015-05-18 NOTE — Progress Notes (Signed)
   Subjective:    Patient ID: Peter Snyder, male    DOB: May 28, 1955, 60 y.o.   MRN: [id number]  HPI  Patient seen for acute visit for left scrotal pain.  onset this past Tuesday. Denies injury.  Dull ache. No pain with walking. Worse with sitting.  He thinks he may of had some mild urine frequency and a couple episodes of mild burning with urination.  No gross hematuria. No penile discharge. No fevers or chills.  He has not noted any inguinal swelling. No skin rash.  No perianal pain.  Past Medical History  Diagnosis Date  . Arthritis   . Chicken pox   . Depression   . GERD (gastroesophageal reflux disease)   . CAD (coronary artery disease) 6/14    Cardiac catheterization 6/27/ 2014 ejection fraction 35-40%, 30% proximal left circumflex, 100% tiny obtuse marginal 1 with collaterals, 50% LAD, 50% D1, 100% RCA with collaterals  . Hypertension   . Hyperlipidemia   . Kidney stone   . Colon polyps   . Diabetes mellitus ([place])     Diet controlled   Past Surgical History  Procedure Laterality Date  . Tonsillectomy  1963    reports that he has never smoked. He does not have any smokeless tobacco history on file. He reports that he does not drink alcohol or use illicit drugs. family history includes Arthritis in his father and paternal grandmother; Heart disease in his maternal grandmother; Hyperlipidemia in his maternal grandmother. Allergies  Allergen Reactions  . Sulfa Antibiotics Rash      Review of Systems  Constitutional: Negative for fever and chills.  Genitourinary: Positive for frequency. Negative for decreased urine volume and discharge.  Skin: Negative for rash.       Objective:   Physical Exam  Constitutional: He appears well-developed and well-nourished. No distress.  Cardiovascular: Normal rate and regular rhythm.   Pulmonary/Chest: Effort normal and breath sounds normal. No respiratory distress. He has no wheezes. He has no rales.  Genitourinary:  Testes are  normal. No epididymis tenderness. No testicular masses. No inguinal hernia. No significant inguinal adenopathy. Rectal exam prostate is nontender with no mass. No perianal tenderness or visible swelling          Assessment & Plan:   Left scrotal pain. Unremarkable exam. No evidence for hernia.  No mass. No testicular tenderness. We have recommended anti-inflammatories with Celebrex. He is encouraged to use support briefs until pain clears. Urinalysis unremarkable. Touch base if symptoms not improving over the next couple of weeks.

## 2015-05-18 NOTE — Progress Notes (Signed)
Pre visit review using our clinic review tool, if applicable. No additional management support is needed unless otherwise documented below in the visit note. 

## 2015-05-18 NOTE — Patient Instructions (Signed)
Continue with Celebrex Consider briefs for better support Follow up for any swelling or other concerns.

## 2015-06-04 ENCOUNTER — Telehealth: Payer: Self-pay | Admitting: Family Medicine

## 2015-06-04 ENCOUNTER — Ambulatory Visit (INDEPENDENT_AMBULATORY_CARE_PROVIDER_SITE_OTHER): Payer: BLUE CROSS/BLUE SHIELD | Admitting: Family Medicine

## 2015-06-04 ENCOUNTER — Encounter: Payer: Self-pay | Admitting: Family Medicine

## 2015-06-04 VITALS — BP 100/70 | HR 87 | Temp 97.9°F | Ht 75.0 in | Wt 208.0 lb

## 2015-06-04 DIAGNOSIS — R635 Abnormal weight gain: Secondary | ICD-10-CM | POA: Diagnosis not present

## 2015-06-04 MED ORDER — FLUTICASONE PROPIONATE 50 MCG/ACT NA SUSP: 2.0000 | Freq: Every day | NASAL | Status: DC

## 2015-06-04 NOTE — Progress Notes (Signed)
   Subjective:    Patient ID: Peter Snyder, male    DOB: Feb 08, 1956, 60 y.o.   MRN: [id number]  HPI  patient seen with concern for weight fluctuation.  His home weight early morning is usually 199 pounds and this morning weight of 202. He does have history of CAD but no history of CHF. He had recent echocardiogram January of this year with ejection fraction 55-60%.   He has not noted any recent increased peripheral edema.  He still walks daily and has had no increased dyspnea with exertion.  No orthopnea.   He does recall eating [place] food last night and also cheese and crackers- so likely very high sodium intake. He had been told follow-up for any rapid fluctuations in weight. Denies any recent chest pains  Past Medical History  Diagnosis Date  . Arthritis   . Chicken pox   . Depression   . GERD (gastroesophageal reflux disease)   . CAD (coronary artery disease) 6/14    Cardiac catheterization 6/27/ 2014 ejection fraction 35-40%, 30% proximal left circumflex, 100% tiny obtuse marginal 1 with collaterals, 50% LAD, 50% D1, 100% RCA with collaterals  . Hypertension   . Hyperlipidemia   . Kidney stone   . Colon polyps   . Diabetes mellitus ([place])     Diet controlled   Past Surgical History  Procedure Laterality Date  . Tonsillectomy  1963    reports that he has never smoked. He does not have any smokeless tobacco history on file. He reports that he does not drink alcohol or use illicit drugs. family history includes Arthritis in his father and paternal grandmother; Heart disease in his maternal grandmother; Hyperlipidemia in his maternal grandmother. Allergies  Allergen Reactions  . Sulfa Antibiotics Rash  '    Review of Systems  Constitutional: Negative for fatigue.  Eyes: Negative for visual disturbance.  Respiratory: Negative for cough, chest tightness and shortness of breath.   Cardiovascular: Negative for chest pain, palpitations and leg swelling.  Endocrine: Negative  for polydipsia and polyuria.  Neurological: Negative for dizziness, syncope, weakness, light-headedness and headaches.       Objective:   Physical Exam  Constitutional: He appears well-developed and well-nourished. No distress.  Neck: Neck supple. No JVD present.  Cardiovascular: Normal rate and regular rhythm.  Exam reveals no gallop.   Pulmonary/Chest: Effort normal and breath sounds normal. No respiratory distress. He has no wheezes. He has no rales.  Musculoskeletal: He exhibits no edema.          Assessment & Plan:  Mild weight gain. No history of systolic heart failure. He does not have any concerning symptoms for CHF and no prior history.   Suspect his very mild weight gain related to increased sodium consumption yesterday. Continue to observe for now. Follow-up promptly for any shortness of breath or increased peripheral edema

## 2015-06-04 NOTE — Patient Instructions (Signed)
Follow up for any increased shortness of breath with walking or lying supine Monitor weight and be in touch for weight gain > 3 pounds in one day or 5 pounds in one week Follow up for any increased peripheral edema.

## 2015-06-04 NOTE — Telephone Encounter (Signed)
[facility] - Client [place] [facility]  Patient Name: Peter Snyder  DOB: Jul 01, 1955    Initial Comment caller states his weight is flux up and down a few lbs, yest it was 199, today 76   Nurse Assessment  Nurse: Arnetha Courser, RN, Susie Date/Time (Eastern Time): 06/04/2015 8:35:39 AM  Confirm and document reason for call. If symptomatic, describe symptoms. You must click the next button to save text entered. ---caller states his weight is flux up and down a few lbs and has been occurring, yesterday it was 199, today 203; history blocked right coronary artery; no known swelling; no respiratory issues;  Has the patient traveled out of the country within the last 30 days? ---Not Applicable  Does the patient have any new or worsening symptoms? ---Yes  Will a triage be completed? ---Yes  Related visit to physician within the last 2 weeks? ---N/A  Does the PT have any chronic conditions? (i.e. diabetes, asthma, etc.) ---Yes  List chronic conditions. ---Right Coronary Artery Blockage; Hypertension  Is this a behavioral health or substance abuse call? ---No     Guidelines    Guideline Title Affirmed Question Affirmed Notes  Leg Swelling and Edema [1] MILD swelling of both ankles (i.e., pedal edema) AND [2] is a chronic symptom (recurrent or ongoing AND present > 4 weeks)    Final Disposition User   See PCP within 2 Weeks Wisdom, RN, Susie    Referrals  REFERRED TO PCP OFFICE   Disagree/Comply: Comply

## 2015-06-05 ENCOUNTER — Ambulatory Visit: Payer: Self-pay | Admitting: Family Medicine

## 2015-06-15 ENCOUNTER — Other Ambulatory Visit: Payer: Self-pay | Admitting: Cardiology

## 2015-06-15 NOTE — Telephone Encounter (Signed)
Rx(s) sent to pharmacy electronically.  

## 2015-06-18 DIAGNOSIS — M50922 Unspecified cervical disc disorder at C5-C6 level: Secondary | ICD-10-CM | POA: Diagnosis not present

## 2015-06-18 DIAGNOSIS — M5136 Other intervertebral disc degeneration, lumbar region: Secondary | ICD-10-CM | POA: Diagnosis not present

## 2015-06-18 DIAGNOSIS — M50923 Unspecified cervical disc disorder at C6-C7 level: Secondary | ICD-10-CM | POA: Diagnosis not present

## 2015-06-18 DIAGNOSIS — M25511 Pain in right shoulder: Secondary | ICD-10-CM | POA: Diagnosis not present

## 2015-06-20 DIAGNOSIS — M545 Low back pain: Secondary | ICD-10-CM | POA: Diagnosis not present

## 2015-06-21 DIAGNOSIS — M50922 Unspecified cervical disc disorder at C5-C6 level: Secondary | ICD-10-CM | POA: Diagnosis not present

## 2015-06-21 DIAGNOSIS — M50923 Unspecified cervical disc disorder at C6-C7 level: Secondary | ICD-10-CM | POA: Diagnosis not present

## 2015-06-21 DIAGNOSIS — M5136 Other intervertebral disc degeneration, lumbar region: Secondary | ICD-10-CM | POA: Diagnosis not present

## 2015-06-21 DIAGNOSIS — M25511 Pain in right shoulder: Secondary | ICD-10-CM | POA: Diagnosis not present

## 2015-06-25 DIAGNOSIS — M7501 Adhesive capsulitis of right shoulder: Secondary | ICD-10-CM | POA: Diagnosis not present

## 2015-06-26 DIAGNOSIS — F411 Generalized anxiety disorder: Secondary | ICD-10-CM | POA: Diagnosis not present

## 2015-06-27 DIAGNOSIS — M50923 Unspecified cervical disc disorder at C6-C7 level: Secondary | ICD-10-CM | POA: Diagnosis not present

## 2015-06-27 DIAGNOSIS — M50922 Unspecified cervical disc disorder at C5-C6 level: Secondary | ICD-10-CM | POA: Diagnosis not present

## 2015-06-27 DIAGNOSIS — M25511 Pain in right shoulder: Secondary | ICD-10-CM | POA: Diagnosis not present

## 2015-06-27 DIAGNOSIS — M5136 Other intervertebral disc degeneration, lumbar region: Secondary | ICD-10-CM | POA: Diagnosis not present

## 2015-06-29 DIAGNOSIS — M5136 Other intervertebral disc degeneration, lumbar region: Secondary | ICD-10-CM | POA: Diagnosis not present

## 2015-06-29 DIAGNOSIS — M50922 Unspecified cervical disc disorder at C5-C6 level: Secondary | ICD-10-CM | POA: Diagnosis not present

## 2015-06-29 DIAGNOSIS — M25511 Pain in right shoulder: Secondary | ICD-10-CM | POA: Diagnosis not present

## 2015-06-29 DIAGNOSIS — M50923 Unspecified cervical disc disorder at C6-C7 level: Secondary | ICD-10-CM | POA: Diagnosis not present

## 2015-07-02 DIAGNOSIS — M5136 Other intervertebral disc degeneration, lumbar region: Secondary | ICD-10-CM | POA: Diagnosis not present

## 2015-07-02 DIAGNOSIS — M50923 Unspecified cervical disc disorder at C6-C7 level: Secondary | ICD-10-CM | POA: Diagnosis not present

## 2015-07-02 DIAGNOSIS — M25511 Pain in right shoulder: Secondary | ICD-10-CM | POA: Diagnosis not present

## 2015-07-02 DIAGNOSIS — M50922 Unspecified cervical disc disorder at C5-C6 level: Secondary | ICD-10-CM | POA: Diagnosis not present

## 2015-07-04 DIAGNOSIS — M25511 Pain in right shoulder: Secondary | ICD-10-CM | POA: Diagnosis not present

## 2015-07-04 DIAGNOSIS — M50923 Unspecified cervical disc disorder at C6-C7 level: Secondary | ICD-10-CM | POA: Diagnosis not present

## 2015-07-04 DIAGNOSIS — M5136 Other intervertebral disc degeneration, lumbar region: Secondary | ICD-10-CM | POA: Diagnosis not present

## 2015-07-04 DIAGNOSIS — F411 Generalized anxiety disorder: Secondary | ICD-10-CM | POA: Diagnosis not present

## 2015-07-04 DIAGNOSIS — M50922 Unspecified cervical disc disorder at C5-C6 level: Secondary | ICD-10-CM | POA: Diagnosis not present

## 2015-07-08 NOTE — Progress Notes (Signed)
HPI: FU coronary artery disease. Previous cardiac care in [place]. Cardiac catheterization performed in June of 2014 showed an ejection fraction of 35-40%. The inferior wall is akinetic. The left main was normal. There was a 30% proximal circumflex, occluded small first obtuse marginal with collaterals, a 50% ramus intermedius, 50% LAD, 50% first diagonal and an occluded right coronary artery with collaterals. Treated medically. Nuclear study January 2017 showed ejection fraction 35%. There was a prior inferior infarct with mild peri-infarct ischemia. Echocardiogram In January 2017 showed normal LV systolic function, trace aortic insufficiency mild mitral regurgitation and mild left atrial enlargement. I have reviewed the echocardiogram and there appears to be akinesis of the inferior and basal inferior lateral wall. Ejection fraction in the 35-40% range. Siince last seen, the patient denies any dyspnea on exertion, orthopnea, PND, pedal edema, palpitations, syncope or chest pain.   Current Outpatient Prescriptions  Medication Sig Dispense Refill  . acetaminophen (TYLENOL) 500 MG tablet Take 500 mg by mouth every 6 (six) hours as needed for moderate pain.    Marland Kitchen ALPRAZolam (XANAX) 0.25 MG tablet Take 0.25 mg by mouth daily.     Marland Kitchen aspirin 81 MG tablet Take 81 mg by mouth daily.    Marland Kitchen atorvastatin (LIPITOR) 80 MG tablet TAKE 1 TABLET BY MOUTH EVERY DAY 90 tablet 3  . AZASITE 1 % ophthalmic solution INSTILL 1 DROP INTO AFFECTED EYE AT BEDTIME  4  . carvedilol (COREG) 12.5 MG tablet Take 1 tablet (12.5 mg total) by mouth 2 (two) times daily with a meal. 270 tablet 2  . celecoxib (CELEBREX) 200 MG capsule Take 200 mg by mouth daily.     . clonazePAM (KLONOPIN) 0.5 MG tablet Take 0.5 mg by mouth at bedtime.     Marland Kitchen escitalopram (LEXAPRO) 10 MG tablet Take 10 mg by mouth daily.    . fish oil-omega-3 fatty acids 1000 MG capsule Take 2 g by mouth daily.    . fluticasone (FLONASE) 50 MCG/ACT nasal spray  Place 2 sprays into both nostrils daily. 16 g 6  . gabapentin (NEURONTIN) 600 MG tablet TAKE 1 TABLET (600 MG TOTAL) BY MOUTH 3 (THREE) TIMES DAILY. 270 tablet 3  . lisinopril (PRINIVIL,ZESTRIL) 10 MG tablet TAKE 1 TABLET (10 MG TOTAL) BY MOUTH DAILY. 90 tablet 3  . pantoprazole (PROTONIX) 40 MG tablet TAKE 1 TABLET (40 MG TOTAL) BY MOUTH DAILY. 90 tablet 1  . RESTASIS 0.05 % ophthalmic emulsion Place 1 drop into both eyes 2 (two) times daily.     . tamsulosin (FLOMAX) 0.4 MG CAPS capsule TAKE ONE CAPSULE BY MOUTH 1/2 HOUR HOUR FOLLOWING THE SAME MEAL EACH DAY 30 capsule 11  . VOLTAREN 1 % GEL Apply 2 g topically daily.     Marland Kitchen ZETIA 10 MG tablet TAKE 1 TABLET BY MOUTH EVERY DAY 30 tablet 5  . zolpidem (AMBIEN) 10 MG tablet Take 10 mg by mouth at bedtime as needed for sleep.    Marland Kitchen ZOVIRAX 5 % Take 1 application by mouth 4 (four) times daily - after meals and at bedtime.   0   No current facility-administered medications for this visit.     Past Medical History  Diagnosis Date  . Arthritis   . Chicken pox   . Depression   . GERD (gastroesophageal reflux disease)   . CAD (coronary artery disease) 6/14    Cardiac catheterization 6/27/ 2014 ejection fraction 35-40%, 30% proximal left circumflex, 100% tiny obtuse marginal 1  with collaterals, 50% LAD, 50% D1, 100% RCA with collaterals  . Hypertension   . Hyperlipidemia   . Kidney stone   . Colon polyps   . Diabetes mellitus ([place])     Diet controlled    Past Surgical History  Procedure Laterality Date  . Tonsillectomy  1963    Social History   Social History  . Marital Status: Married    Spouse Name: N/A  . Number of Children: 3  . Years of Education: N/A   Occupational History  .      Retired   Social History Main Topics  . Smoking status: Never Smoker   . Smokeless tobacco: Not on file  . Alcohol Use: No  . Drug Use: No  . Sexual Activity: Not on file   Other Topics Concern  . Not on file   Social History Narrative      Family History  Problem Relation Age of Onset  . Arthritis Father   . Hyperlipidemia Maternal Grandmother   . Heart disease Maternal Grandmother   . Arthritis Paternal Grandmother     ROS: no fevers or chills, productive cough, hemoptysis, dysphasia, odynophagia, melena, hematochezia, dysuria, hematuria, rash, seizure activity, orthopnea, PND, pedal edema, claudication. Remaining systems are negative.  Physical Exam: Well-developed well-nourished in no acute distress.  Skin is warm and dry.  HEENT is normal.  Neck is supple.  Chest is clear to auscultation with normal expansion.  Cardiovascular exam is regular rate and rhythm.  Abdominal exam nontender or distended. No masses palpated. Extremities show no edema. neuro grossly intact

## 2015-07-09 DIAGNOSIS — M25511 Pain in right shoulder: Secondary | ICD-10-CM | POA: Diagnosis not present

## 2015-07-09 DIAGNOSIS — M5136 Other intervertebral disc degeneration, lumbar region: Secondary | ICD-10-CM | POA: Diagnosis not present

## 2015-07-09 DIAGNOSIS — M50922 Unspecified cervical disc disorder at C5-C6 level: Secondary | ICD-10-CM | POA: Diagnosis not present

## 2015-07-09 DIAGNOSIS — M50923 Unspecified cervical disc disorder at C6-C7 level: Secondary | ICD-10-CM | POA: Diagnosis not present

## 2015-07-11 DIAGNOSIS — M50923 Unspecified cervical disc disorder at C6-C7 level: Secondary | ICD-10-CM | POA: Diagnosis not present

## 2015-07-11 DIAGNOSIS — M50922 Unspecified cervical disc disorder at C5-C6 level: Secondary | ICD-10-CM | POA: Diagnosis not present

## 2015-07-11 DIAGNOSIS — M25511 Pain in right shoulder: Secondary | ICD-10-CM | POA: Diagnosis not present

## 2015-07-11 DIAGNOSIS — M5136 Other intervertebral disc degeneration, lumbar region: Secondary | ICD-10-CM | POA: Diagnosis not present

## 2015-07-12 ENCOUNTER — Ambulatory Visit (INDEPENDENT_AMBULATORY_CARE_PROVIDER_SITE_OTHER): Payer: BLUE CROSS/BLUE SHIELD | Admitting: Cardiology

## 2015-07-12 ENCOUNTER — Encounter: Payer: Self-pay | Admitting: Cardiology

## 2015-07-12 VITALS — BP 116/68 | HR 78 | Ht 72.0 in | Wt 206.0 lb

## 2015-07-12 DIAGNOSIS — I2583 Coronary atherosclerosis due to lipid rich plaque: Secondary | ICD-10-CM

## 2015-07-12 DIAGNOSIS — I255 Ischemic cardiomyopathy: Secondary | ICD-10-CM

## 2015-07-12 DIAGNOSIS — I2510 Atherosclerotic heart disease of native coronary artery without angina pectoris: Secondary | ICD-10-CM

## 2015-07-12 DIAGNOSIS — F411 Generalized anxiety disorder: Secondary | ICD-10-CM | POA: Diagnosis not present

## 2015-07-12 MED ORDER — LISINOPRIL 20 MG PO TABS: 20.0000 mg | ORAL_TABLET | Freq: Every day | ORAL | Status: DC

## 2015-07-12 NOTE — Assessment & Plan Note (Addendum)
I have reviewed the patient's recent transthoracic echocardiogram. His ejection fraction is moderately reduced with akinesis of the inferior and inferior lateral wall. Nuclear study with ejection fraction 35%. I think we need to be definitive in evaluating his ejection fraction for consideration of ICD. I will arrange a cardiac MRI. If ejection fraction 35% or less we will refer to electrophysiology for ICD. Otherwise we will continue medical therapy. Continue carvedilol. I will try and increase all taste to 20 mg daily. Check potassium and renal function in 1 week. If he develops hypotension or dizziness we will resume 10 mg daily.

## 2015-07-12 NOTE — Assessment & Plan Note (Signed)
Blood pressure controlled. Continue present medications. 

## 2015-07-12 NOTE — Assessment & Plan Note (Signed)
Continue aspirin and statin. 

## 2015-07-12 NOTE — Assessment & Plan Note (Signed)
Continue statin. 

## 2015-07-12 NOTE — Addendum Note (Signed)
Addended by: Cristopher Estimable on: 07/12/2015 12:58 PM   Modules accepted: Orders

## 2015-07-12 NOTE — Patient Instructions (Addendum)
Medication Instructions:   INCREASE LISINOPRIL TO 20 MG ONCE DAILY= 2 OF THE 10 MG TABLETS ONCE DAILY  Your physician recommends that you return for lab work in: [place]  Testing/Procedures:  Your physician has requested that you have a cardiac MRI. Cardiac MRI uses a computer to create images of your heart as its beating, producing both still and moving pictures of your heart and major blood vessels. For further information please visit [email or web address] Please follow the instruction sheet given to you today for more information.    Follow-Up:  Your physician recommends that you schedule a follow-up appointment in:

## 2015-07-13 ENCOUNTER — Encounter: Payer: Self-pay | Admitting: Cardiology

## 2015-07-13 ENCOUNTER — Ambulatory Visit (INDEPENDENT_AMBULATORY_CARE_PROVIDER_SITE_OTHER): Payer: BLUE CROSS/BLUE SHIELD | Admitting: Family Medicine

## 2015-07-13 ENCOUNTER — Telehealth: Payer: Self-pay | Admitting: Cardiology

## 2015-07-13 VITALS — BP 120/80 | HR 85 | Temp 97.9°F | Ht 72.0 in | Wt 206.0 lb

## 2015-07-13 DIAGNOSIS — R1013 Epigastric pain: Secondary | ICD-10-CM | POA: Diagnosis not present

## 2015-07-13 MED ORDER — PANTOPRAZOLE SODIUM 40 MG PO TBEC: 40.0000 mg | DELAYED_RELEASE_TABLET | Freq: Two times a day (BID) | ORAL | Status: DC

## 2015-07-13 NOTE — Progress Notes (Signed)
   Subjective:    Patient ID: Peter Snyder, male    DOB: June 01, 1955, 60 y.o.   MRN: [id number]  HPI Patient is here with some issues with "upset stomach " He thinks this is stress related.   History of CAD. He had nuclear stress test which showed ejection fraction 35% and subsequent echo 55-60%. Saw cardiology yesterday. Planned cardiac MRI with consideration for ICD. Patient has been very stressed regarding this. Lisinopril was increased to 20 mg daily. He started this yesterday.   Long-standing history of anxiety. Still followed by psychiatrist in [place]. He takes Xanax and is also on Lexapro. He complains of some nonspecific irritation epigastric region. No melena. No nausea or vomiting. Good appetite. He describes as more of an "unsettled stomach". He had similar issues in the past with anxiety. Occasional burning sensation. Takes Protonix 40 mg once daily.  Past Medical History  Diagnosis Date  . Arthritis   . Chicken pox   . Depression   . GERD (gastroesophageal reflux disease)   . CAD (coronary artery disease) 6/14    Cardiac catheterization 6/27/ 2014 ejection fraction 35-40%, 30% proximal left circumflex, 100% tiny obtuse marginal 1 with collaterals, 50% LAD, 50% D1, 100% RCA with collaterals  . Hypertension   . Hyperlipidemia   . Kidney stone   . Colon polyps   . Diabetes mellitus ([place])     Diet controlled   Past Surgical History  Procedure Laterality Date  . Tonsillectomy  1963    reports that he has never smoked. He does not have any smokeless tobacco history on file. He reports that he does not drink alcohol or use illicit drugs. family history includes Arthritis in his father and paternal grandmother; Heart disease in his maternal grandmother; Hyperlipidemia in his maternal grandmother. Allergies  Allergen Reactions  . Sulfa Antibiotics Rash      Review of Systems  Constitutional: Negative for fatigue.  Eyes: Negative for visual disturbance.  Respiratory:  Negative for cough, chest tightness and shortness of breath.   Cardiovascular: Negative for chest pain, palpitations and leg swelling.  Gastrointestinal: Positive for abdominal pain. Negative for nausea, vomiting, diarrhea, constipation and abdominal distention.  Neurological: Negative for dizziness, syncope, weakness, light-headedness and headaches.  Psychiatric/Behavioral: The patient is nervous/anxious.        Objective:   Physical Exam  Constitutional: He appears well-developed and well-nourished.  Neck: Neck supple. No thyromegaly present.  Cardiovascular: Normal rate and regular rhythm.   Pulmonary/Chest: Effort normal and breath sounds normal. No respiratory distress. He has no wheezes. He has no rales.  Musculoskeletal: He exhibits no edema.          Assessment & Plan:   Patient is seen with nonspecific GI symptoms. Suspect stress-related. Short-term, he will increase Protonix to 40 mg twice daily. We discussed nonpharmacologic ways to reduce stress and anxiety.  He is already in counseling.  [name] MD [facility] at [facility]

## 2015-07-13 NOTE — Patient Instructions (Signed)
Go ahead and increase your Protonix to 40 mg twice daily.

## 2015-07-13 NOTE — Progress Notes (Signed)
Pre visit review using our clinic review tool, if applicable. No additional management support is needed unless otherwise documented below in the visit note. 

## 2015-07-13 NOTE — Telephone Encounter (Signed)
Called the patient and gave him date, time and location of cardiac MRI.  Letter mailed today. °

## 2015-07-13 NOTE — Telephone Encounter (Signed)
Spoke to patient.He sent a email wanting to know when he needed lab before Cardiac MRI.MRI scheduled 07/25/15.Stated he was told he needed lab next week since he increased lisinopril and he did not want to have double lab.Advised ok to have bmet done one time and next week will be ok.

## 2015-07-16 DIAGNOSIS — M5136 Other intervertebral disc degeneration, lumbar region: Secondary | ICD-10-CM | POA: Diagnosis not present

## 2015-07-16 DIAGNOSIS — M50923 Unspecified cervical disc disorder at C6-C7 level: Secondary | ICD-10-CM | POA: Diagnosis not present

## 2015-07-16 DIAGNOSIS — M25511 Pain in right shoulder: Secondary | ICD-10-CM | POA: Diagnosis not present

## 2015-07-16 DIAGNOSIS — M50922 Unspecified cervical disc disorder at C5-C6 level: Secondary | ICD-10-CM | POA: Diagnosis not present

## 2015-07-17 DIAGNOSIS — F411 Generalized anxiety disorder: Secondary | ICD-10-CM | POA: Diagnosis not present

## 2015-07-19 DIAGNOSIS — I2589 Other forms of chronic ischemic heart disease: Secondary | ICD-10-CM | POA: Diagnosis not present

## 2015-07-19 DIAGNOSIS — M50922 Unspecified cervical disc disorder at C5-C6 level: Secondary | ICD-10-CM | POA: Diagnosis not present

## 2015-07-19 DIAGNOSIS — M50923 Unspecified cervical disc disorder at C6-C7 level: Secondary | ICD-10-CM | POA: Diagnosis not present

## 2015-07-19 DIAGNOSIS — I255 Ischemic cardiomyopathy: Secondary | ICD-10-CM | POA: Diagnosis not present

## 2015-07-19 DIAGNOSIS — M25511 Pain in right shoulder: Secondary | ICD-10-CM | POA: Diagnosis not present

## 2015-07-19 DIAGNOSIS — M5136 Other intervertebral disc degeneration, lumbar region: Secondary | ICD-10-CM | POA: Diagnosis not present

## 2015-07-20 DIAGNOSIS — M545 Low back pain: Secondary | ICD-10-CM | POA: Diagnosis not present

## 2015-07-20 LAB — BASIC METABOLIC PANEL
BUN: 14 mg/dL (ref 7–25)
CHLORIDE: 100 mmol/L (ref 98–110)
CO2: 29 mmol/L (ref 20–31)
CREATININE: 1.04 mg/dL (ref 0.70–1.25)
Calcium: 9.1 mg/dL (ref 8.6–10.3)
GLUCOSE: 111 mg/dL — AB (ref 65–99)
Potassium: 4.3 mmol/L (ref 3.5–5.3)
Sodium: 137 mmol/L (ref 135–146)

## 2015-07-23 DIAGNOSIS — M7541 Impingement syndrome of right shoulder: Secondary | ICD-10-CM | POA: Diagnosis not present

## 2015-07-23 DIAGNOSIS — M7501 Adhesive capsulitis of right shoulder: Secondary | ICD-10-CM | POA: Diagnosis not present

## 2015-07-24 ENCOUNTER — Ambulatory Visit (INDEPENDENT_AMBULATORY_CARE_PROVIDER_SITE_OTHER): Payer: BLUE CROSS/BLUE SHIELD | Admitting: Family Medicine

## 2015-07-24 ENCOUNTER — Encounter: Payer: Self-pay | Admitting: Family Medicine

## 2015-07-24 VITALS — BP 105/68 | HR 78 | Temp 98.4°F | Ht 72.0 in | Wt 206.0 lb

## 2015-07-24 DIAGNOSIS — J029 Acute pharyngitis, unspecified: Secondary | ICD-10-CM | POA: Diagnosis not present

## 2015-07-24 NOTE — Progress Notes (Signed)
   Subjective:    Patient ID: Peter Snyder, male    DOB: 03/05/1956, 60 y.o.   MRN: [id number]  HPI  Patient seen with sore throat and fatigue for the past week  increased nasal congestion and some postnasal drip.  He thinks some of this may be related to pollen exposure outdoors.  No fevers or chills.  No cough.  Walks about 2-3 miles per day outside and symptoms seem to be worse first thing in the morning and after being outdoors.  Past Medical History  Diagnosis Date  . Arthritis   . Chicken pox   . Depression   . GERD (gastroesophageal reflux disease)   . CAD (coronary artery disease) 6/14    Cardiac catheterization 6/27/ 2014 ejection fraction 35-40%, 30% proximal left circumflex, 100% tiny obtuse marginal 1 with collaterals, 50% LAD, 50% D1, 100% RCA with collaterals  . Hypertension   . Hyperlipidemia   . Kidney stone   . Colon polyps   . Diabetes mellitus ([place])     Diet controlled   Past Surgical History  Procedure Laterality Date  . Tonsillectomy  1963    reports that he has never smoked. He does not have any smokeless tobacco history on file. He reports that he does not drink alcohol or use illicit drugs. family history includes Arthritis in his father and paternal grandmother; Heart disease in his maternal grandmother; Hyperlipidemia in his maternal grandmother. Allergies  Allergen Reactions  . Sulfa Antibiotics Rash      Review of Systems  Constitutional: Positive for fatigue. Negative for fever and chills.  HENT: Positive for congestion and sore throat.   Respiratory: Negative for cough and shortness of breath.   Cardiovascular: Negative for chest pain.       Objective:   Physical Exam  Constitutional: He appears well-developed and well-nourished.  HENT:  Right Ear: External ear normal.  Left Ear: External ear normal.  Mouth/Throat: Oropharynx is clear and moist. No oropharyngeal exudate.  Neck: Neck supple.  Cardiovascular: Normal rate and regular  rhythm.   Pulmonary/Chest: Effort normal and breath sounds normal. No respiratory distress. He has no wheezes. He has no rales.  Lymphadenopathy:    He has no cervical adenopathy.          Assessment & Plan:   Sore throat. Suspect allergic. He does not have any fever, adenopathy, or exudate to suggest infectious origin. Consider over-the-counter medications for allergy including antihistamine and/or nasal steroid. Follow-up as needed  [name] MD [facility] at [facility]

## 2015-07-24 NOTE — Progress Notes (Signed)
Pre visit review using our clinic review tool, if applicable. No additional management support is needed unless otherwise documented below in the visit note. 

## 2015-07-24 NOTE — Patient Instructions (Signed)

## 2015-07-25 ENCOUNTER — Ambulatory Visit (HOSPITAL_COMMUNITY)
Admission: RE | Admit: 2015-07-25 | Discharge: 2015-07-25 | Disposition: A | Discharge status: Home / Self Care | Payer: BLUE CROSS/BLUE SHIELD | Source: Ambulatory Visit | Attending: Cardiology | Admitting: Cardiology

## 2015-07-25 ENCOUNTER — Other Ambulatory Visit: Payer: Self-pay | Admitting: Cardiology

## 2015-07-25 DIAGNOSIS — R29898 Other symptoms and signs involving the musculoskeletal system: Secondary | ICD-10-CM | POA: Diagnosis not present

## 2015-07-25 DIAGNOSIS — I255 Ischemic cardiomyopathy: Secondary | ICD-10-CM | POA: Insufficient documentation

## 2015-07-25 DIAGNOSIS — J984 Other disorders of lung: Secondary | ICD-10-CM | POA: Diagnosis not present

## 2015-07-25 DIAGNOSIS — F411 Generalized anxiety disorder: Secondary | ICD-10-CM | POA: Diagnosis not present

## 2015-07-25 DIAGNOSIS — I517 Cardiomegaly: Secondary | ICD-10-CM | POA: Diagnosis not present

## 2015-07-25 DIAGNOSIS — I77810 Thoracic aortic ectasia: Secondary | ICD-10-CM | POA: Diagnosis not present

## 2015-07-25 MED ORDER — GADOBENATE DIMEGLUMINE 529 MG/ML IV SOLN
30.0000 mL | Freq: Once | INTRAVENOUS | Status: AC
  Administered 2015-07-25: 30 mL via INTRAVENOUS

## 2015-07-26 DIAGNOSIS — M5136 Other intervertebral disc degeneration, lumbar region: Secondary | ICD-10-CM | POA: Diagnosis not present

## 2015-07-26 DIAGNOSIS — M50922 Unspecified cervical disc disorder at C5-C6 level: Secondary | ICD-10-CM | POA: Diagnosis not present

## 2015-07-26 DIAGNOSIS — M50923 Unspecified cervical disc disorder at C6-C7 level: Secondary | ICD-10-CM | POA: Diagnosis not present

## 2015-07-26 DIAGNOSIS — M25511 Pain in right shoulder: Secondary | ICD-10-CM | POA: Diagnosis not present

## 2015-07-31 DIAGNOSIS — M50922 Unspecified cervical disc disorder at C5-C6 level: Secondary | ICD-10-CM | POA: Diagnosis not present

## 2015-07-31 DIAGNOSIS — M25511 Pain in right shoulder: Secondary | ICD-10-CM | POA: Diagnosis not present

## 2015-07-31 DIAGNOSIS — M50923 Unspecified cervical disc disorder at C6-C7 level: Secondary | ICD-10-CM | POA: Diagnosis not present

## 2015-07-31 DIAGNOSIS — M5136 Other intervertebral disc degeneration, lumbar region: Secondary | ICD-10-CM | POA: Diagnosis not present

## 2015-08-02 DIAGNOSIS — M50923 Unspecified cervical disc disorder at C6-C7 level: Secondary | ICD-10-CM | POA: Diagnosis not present

## 2015-08-02 DIAGNOSIS — M25511 Pain in right shoulder: Secondary | ICD-10-CM | POA: Diagnosis not present

## 2015-08-02 DIAGNOSIS — M50922 Unspecified cervical disc disorder at C5-C6 level: Secondary | ICD-10-CM | POA: Diagnosis not present

## 2015-08-02 DIAGNOSIS — M5136 Other intervertebral disc degeneration, lumbar region: Secondary | ICD-10-CM | POA: Diagnosis not present

## 2015-08-02 DIAGNOSIS — F411 Generalized anxiety disorder: Secondary | ICD-10-CM | POA: Diagnosis not present

## 2015-08-03 DIAGNOSIS — R51 Headache: Secondary | ICD-10-CM | POA: Diagnosis not present

## 2015-08-06 DIAGNOSIS — M50922 Unspecified cervical disc disorder at C5-C6 level: Secondary | ICD-10-CM | POA: Diagnosis not present

## 2015-08-06 DIAGNOSIS — M25511 Pain in right shoulder: Secondary | ICD-10-CM | POA: Diagnosis not present

## 2015-08-06 DIAGNOSIS — M50923 Unspecified cervical disc disorder at C6-C7 level: Secondary | ICD-10-CM | POA: Diagnosis not present

## 2015-08-06 DIAGNOSIS — M5136 Other intervertebral disc degeneration, lumbar region: Secondary | ICD-10-CM | POA: Diagnosis not present

## 2015-08-07 NOTE — Progress Notes (Signed)
HPI: FU coronary artery disease. Previous cardiac care in [place]. Cardiac catheterization performed in June of 2014 showed an ejection fraction of 35-40%. The inferior wall is akinetic. The left main was normal. There was a 30% proximal circumflex, occluded small first obtuse marginal with collaterals, a 50% ramus intermedius, 50% LAD, 50% first diagonal and an occluded right coronary artery with collaterals. Treated medically. Nuclear study January 2017 showed ejection fraction 35%. There was a prior inferior infarct with mild peri-infarct ischemia. Echocardiogram In January 2017 showed normal LV systolic function, trace aortic insufficiency mild mitral regurgitation and mild left atrial enlargement. I have reviewed the echocardiogram and there appears to be akinesis of the inferior and basal inferior lateral wall. Ejection fraction in the 35-40% range. Cardiac MRI May 2017 showed akinetic inferior wall with ejection fraction 40%. Biatrial enlargement and mild aortic/mitral regurgitation. Aortic root 3.9 cm. Siince last seen, the patient denies any dyspnea on exertion, orthopnea, PND, pedal edema, palpitations, syncope or chest pain.  Current Outpatient Prescriptions  Medication Sig Dispense Refill  . acetaminophen (TYLENOL) 500 MG tablet Take 500 mg by mouth every 6 (six) hours as needed for moderate pain.    Marland Kitchen ALPRAZolam (XANAX) 0.25 MG tablet Take 0.25 mg by mouth daily.     Marland Kitchen aspirin 81 MG tablet Take 81 mg by mouth daily.    Marland Kitchen atorvastatin (LIPITOR) 80 MG tablet TAKE 1 TABLET BY MOUTH EVERY DAY 90 tablet 3  . AZASITE 1 % ophthalmic solution INSTILL 1 DROP INTO AFFECTED EYE AT BEDTIME  4  . carvedilol (COREG) 12.5 MG tablet Take 1 tablet (12.5 mg total) by mouth 2 (two) times daily with a meal. 270 tablet 2  . celecoxib (CELEBREX) 200 MG capsule Take 200 mg by mouth daily.     . clonazePAM (KLONOPIN) 0.5 MG tablet Take 0.5 mg by mouth at bedtime.     Marland Kitchen escitalopram (LEXAPRO) 10 MG tablet  Take 20 mg by mouth daily.     . fluticasone (FLONASE) 50 MCG/ACT nasal spray Place 2 sprays into both nostrils daily. 16 g 6  . gabapentin (NEURONTIN) 600 MG tablet TAKE 1 TABLET (600 MG TOTAL) BY MOUTH 3 (THREE) TIMES DAILY. 270 tablet 3  . lisinopril (PRINIVIL,ZESTRIL) 20 MG tablet Take 1 tablet (20 mg total) by mouth daily. 90 tablet 3  . pantoprazole (PROTONIX) 40 MG tablet Take 1 tablet (40 mg total) by mouth 2 (two) times daily. 60 tablet 11  . RESTASIS 0.05 % ophthalmic emulsion Place 1 drop into both eyes 2 (two) times daily.     . tamsulosin (FLOMAX) 0.4 MG CAPS capsule TAKE ONE CAPSULE BY MOUTH 1/2 HOUR HOUR FOLLOWING THE SAME MEAL EACH DAY 30 capsule 11  . VOLTAREN 1 % GEL Apply 2 g topically daily.     Marland Kitchen ZETIA 10 MG tablet TAKE 1 TABLET BY MOUTH EVERY DAY 30 tablet 5  . zolpidem (AMBIEN) 10 MG tablet Take 10 mg by mouth at bedtime as needed for sleep.     No current facility-administered medications for this visit.     Past Medical History  Diagnosis Date  . Arthritis   . Chicken pox   . Depression   . GERD (gastroesophageal reflux disease)   . CAD (coronary artery disease) 6/14    Cardiac catheterization 6/27/ 2014 ejection fraction 35-40%, 30% proximal left circumflex, 100% tiny obtuse marginal 1 with collaterals, 50% LAD, 50% D1, 100% RCA with collaterals  . Hypertension   .  Hyperlipidemia   . Kidney stone   . Colon polyps   . Diabetes mellitus ([place])     Diet controlled    Past Surgical History  Procedure Laterality Date  . Tonsillectomy  1963    Social History   Social History  . Marital Status: Married    Spouse Name: N/A  . Number of Children: 3  . Years of Education: N/A   Occupational History  .      Retired   Social History Main Topics  . Smoking status: Never Smoker   . Smokeless tobacco: Not on file  . Alcohol Use: No  . Drug Use: No  . Sexual Activity: Not on file   Other Topics Concern  . Not on file   Social History Narrative     Family History  Problem Relation Age of Onset  . Arthritis Father   . Hyperlipidemia Maternal Grandmother   . Heart disease Maternal Grandmother   . Arthritis Paternal Grandmother     ROS: no fevers or chills, productive cough, hemoptysis, dysphasia, odynophagia, melena, hematochezia, dysuria, hematuria, rash, seizure activity, orthopnea, PND, pedal edema, claudication. Remaining systems are negative.  Physical Exam: Well-developed well-nourished in no acute distress.  Skin is warm and dry.  HEENT is normal.  Neck is supple.  Chest is clear to auscultation with normal expansion.  Cardiovascular exam is regular rate and rhythm.  Abdominal exam nontender or distended. No masses palpated. Extremities show no edema. neuro grossly intact

## 2015-08-09 ENCOUNTER — Ambulatory Visit (INDEPENDENT_AMBULATORY_CARE_PROVIDER_SITE_OTHER): Payer: BLUE CROSS/BLUE SHIELD | Admitting: Cardiology

## 2015-08-09 ENCOUNTER — Encounter: Payer: Self-pay | Admitting: Cardiology

## 2015-08-09 VITALS — BP 110/70 | HR 70 | Ht 74.0 in | Wt 204.8 lb

## 2015-08-09 DIAGNOSIS — I255 Ischemic cardiomyopathy: Secondary | ICD-10-CM

## 2015-08-09 DIAGNOSIS — M50922 Unspecified cervical disc disorder at C5-C6 level: Secondary | ICD-10-CM | POA: Diagnosis not present

## 2015-08-09 DIAGNOSIS — M5136 Other intervertebral disc degeneration, lumbar region: Secondary | ICD-10-CM | POA: Diagnosis not present

## 2015-08-09 DIAGNOSIS — I10 Essential (primary) hypertension: Secondary | ICD-10-CM

## 2015-08-09 DIAGNOSIS — I2510 Atherosclerotic heart disease of native coronary artery without angina pectoris: Secondary | ICD-10-CM | POA: Diagnosis not present

## 2015-08-09 DIAGNOSIS — E785 Hyperlipidemia, unspecified: Secondary | ICD-10-CM | POA: Diagnosis not present

## 2015-08-09 DIAGNOSIS — M50923 Unspecified cervical disc disorder at C6-C7 level: Secondary | ICD-10-CM | POA: Diagnosis not present

## 2015-08-09 DIAGNOSIS — I2583 Coronary atherosclerosis due to lipid rich plaque: Secondary | ICD-10-CM

## 2015-08-09 DIAGNOSIS — M25511 Pain in right shoulder: Secondary | ICD-10-CM | POA: Diagnosis not present

## 2015-08-09 MED ORDER — CARVEDILOL 12.5 MG PO TABS: 12.5000 mg | ORAL_TABLET | Freq: Two times a day (BID) | ORAL | Status: DC

## 2015-08-09 NOTE — Patient Instructions (Signed)
Your physician wants you to follow-up in: 6 MONTHS WITH DR CRENSHAW You will receive a reminder letter in the mail two months in advance. If you don't receive a letter, please call our office to schedule the follow-up appointment.   If you need a refill on your cardiac medications before your next appointment, please call your pharmacy.  

## 2015-08-09 NOTE — Assessment & Plan Note (Signed)
Ejection fraction 40%. Continue beta blocker and ACE inhibitor.

## 2015-08-09 NOTE — Assessment & Plan Note (Signed)
Blood pressure controlled. Continue present medications. 

## 2015-08-09 NOTE — Assessment & Plan Note (Signed)
Continue aspirin and statin. 

## 2015-08-09 NOTE — Assessment & Plan Note (Signed)
- 

## 2015-08-10 ENCOUNTER — Encounter: Payer: Self-pay | Admitting: Cardiology

## 2015-08-10 DIAGNOSIS — F411 Generalized anxiety disorder: Secondary | ICD-10-CM | POA: Diagnosis not present

## 2015-08-14 ENCOUNTER — Ambulatory Visit (INDEPENDENT_AMBULATORY_CARE_PROVIDER_SITE_OTHER): Payer: BLUE CROSS/BLUE SHIELD | Admitting: Family Medicine

## 2015-08-14 ENCOUNTER — Encounter: Payer: Self-pay | Admitting: Family Medicine

## 2015-08-14 VITALS — BP 100/70 | HR 86 | Temp 98.2°F | Ht 74.0 in | Wt 205.3 lb

## 2015-08-14 DIAGNOSIS — M50922 Unspecified cervical disc disorder at C5-C6 level: Secondary | ICD-10-CM | POA: Diagnosis not present

## 2015-08-14 DIAGNOSIS — R1013 Epigastric pain: Secondary | ICD-10-CM | POA: Diagnosis not present

## 2015-08-14 DIAGNOSIS — M50923 Unspecified cervical disc disorder at C6-C7 level: Secondary | ICD-10-CM | POA: Diagnosis not present

## 2015-08-14 DIAGNOSIS — M5136 Other intervertebral disc degeneration, lumbar region: Secondary | ICD-10-CM | POA: Diagnosis not present

## 2015-08-14 DIAGNOSIS — M25511 Pain in right shoulder: Secondary | ICD-10-CM | POA: Diagnosis not present

## 2015-08-14 MED ORDER — SUCRALFATE 1 GM/10ML PO SUSP: 1.0000 g | Freq: Three times a day (TID) | ORAL | Status: DC

## 2015-08-14 NOTE — Progress Notes (Signed)
Pre visit review using our clinic review tool, if applicable. No additional management support is needed unless otherwise documented below in the visit note. 

## 2015-08-14 NOTE — Progress Notes (Signed)
   Subjective:    Patient ID: Peter Snyder, male    DOB: 1956/03/01, 60 y.o.   MRN: [id number]  HPI Patient seen for discussion regarding some intermittent epigastric burning. He has long history of GERD and currently takes pantoprazole 40 mg twice daily. He does have history of CAD but has had no chest pain whatsoever with walking Walks about 3 miles per day. No exertional dyspnea. Epigastric burning which is very inconsistent. Not consistently related to food. Sometimes food seems to relieve. Denies any nausea or vomiting. No appetite or weight changes. No melena. He does take Celebrex most days. Denies any dysphagia. No clear exacerbating factors. No alleviating factors other than possibly better sometimes with food. Pain does not radiate.  He has history of ischemic cardiomyopathy with recent stable ejection fraction of 40%. This is not limiting his walking in any way.  Past Medical History  Diagnosis Date  . Arthritis   . Chicken pox   . Depression   . GERD (gastroesophageal reflux disease)   . CAD (coronary artery disease) 6/14    Cardiac catheterization 6/27/ 2014 ejection fraction 35-40%, 30% proximal left circumflex, 100% tiny obtuse marginal 1 with collaterals, 50% LAD, 50% D1, 100% RCA with collaterals  . Hypertension   . Hyperlipidemia   . Kidney stone   . Colon polyps   . Diabetes mellitus ([place])     Diet controlled   Past Surgical History  Procedure Laterality Date  . Tonsillectomy  1963    reports that he has never smoked. He does not have any smokeless tobacco history on file. He reports that he does not drink alcohol or use illicit drugs. family history includes Arthritis in his father and paternal grandmother; Heart disease in his maternal grandmother; Hyperlipidemia in his maternal grandmother. Allergies  Allergen Reactions  . Sulfa Antibiotics Rash      Review of Systems  Constitutional: Negative for fever, chills, appetite change and unexpected  weight change.  HENT: Negative for trouble swallowing.   Respiratory: Negative for cough and shortness of breath.   Cardiovascular: Negative for chest pain.  Gastrointestinal: Positive for abdominal pain. Negative for nausea, vomiting, diarrhea, constipation and blood in stool.  Genitourinary: Negative for dysuria.  Neurological: Negative for dizziness.       Objective:   Physical Exam  Constitutional: He appears well-developed and well-nourished.  Cardiovascular: Normal rate and regular rhythm.   Pulmonary/Chest: Effort normal and breath sounds normal. No respiratory distress. He has no wheezes. He has no rales.  Abdominal: Soft. Bowel sounds are normal. He exhibits no distension and no mass. There is no tenderness. There is no rebound and no guarding.  Musculoskeletal: He exhibits no edema.          Assessment & Plan:  Epigastric pain. This does not sound cardiac. He has a very long-standing history of dyspepsia. He is not have any red flag symptoms such as appetite or weight changes or melena. He's already taking Protonix. May need to consider GI referral for possible EGD if symptoms persist. We agree to trial of Carafate suspension 1 g 3 times a day with meals and daily at bedtime.  If he's not seeing relief over the next week or so to be in touch. Try to leave off Celebrex much as possible  [name] MD [facility] at [facility]

## 2015-08-14 NOTE — Patient Instructions (Signed)
Follow up for any black tarry stools, vomiting, or persistent abdominal pain.

## 2015-08-15 ENCOUNTER — Other Ambulatory Visit: Payer: Self-pay | Admitting: Family Medicine

## 2015-08-15 DIAGNOSIS — L570 Actinic keratosis: Secondary | ICD-10-CM | POA: Diagnosis not present

## 2015-08-15 DIAGNOSIS — T148 Other injury of unspecified body region: Secondary | ICD-10-CM | POA: Diagnosis not present

## 2015-08-16 DIAGNOSIS — M50922 Unspecified cervical disc disorder at C5-C6 level: Secondary | ICD-10-CM | POA: Diagnosis not present

## 2015-08-16 DIAGNOSIS — M25511 Pain in right shoulder: Secondary | ICD-10-CM | POA: Diagnosis not present

## 2015-08-16 DIAGNOSIS — M50923 Unspecified cervical disc disorder at C6-C7 level: Secondary | ICD-10-CM | POA: Diagnosis not present

## 2015-08-16 DIAGNOSIS — M5136 Other intervertebral disc degeneration, lumbar region: Secondary | ICD-10-CM | POA: Diagnosis not present

## 2015-08-17 ENCOUNTER — Encounter: Payer: Self-pay | Admitting: Family Medicine

## 2015-08-17 ENCOUNTER — Ambulatory Visit (INDEPENDENT_AMBULATORY_CARE_PROVIDER_SITE_OTHER): Payer: BLUE CROSS/BLUE SHIELD | Admitting: Family Medicine

## 2015-08-17 VITALS — BP 110/80 | HR 85 | Temp 98.5°F | Ht 74.0 in | Wt 204.9 lb

## 2015-08-17 DIAGNOSIS — S30812A Abrasion of penis, initial encounter: Secondary | ICD-10-CM

## 2015-08-17 DIAGNOSIS — R143 Flatulence: Secondary | ICD-10-CM | POA: Diagnosis not present

## 2015-08-17 NOTE — Patient Instructions (Signed)
May take Librax as needed Consider d/c Carafate in 3-4 weeks if abdominal pain better.

## 2015-08-17 NOTE — Progress Notes (Signed)
Pre visit review using our clinic review tool, if applicable. No additional management support is needed unless otherwise documented below in the visit note. 

## 2015-08-17 NOTE — Progress Notes (Signed)
   Subjective:    Patient ID: Peter Snyder, male    DOB: 1956-03-15, 60 y.o.   MRN: [id number]  HPI  Patient here with some lower abdominal gas like symptoms intermittently.  Recently had epigastric symptoms and we added Carafate and he states those symptoms are improved. Patient has had similar symptoms in the past and was prescribed Librax by physician in [place]. He has not taken any of this recently. Denies any stool changes. He takes Metamucil daily. No constipation issues. No bloody stools. No fevers or chills.  Patient also noted small questionable abrasion ventral side of penis. He saw dermatologist and they apparently did some type of culture. He is using Bactroban topically. Denies injury  Past Medical History  Diagnosis Date  . Arthritis   . Chicken pox   . Depression   . GERD (gastroesophageal reflux disease)   . CAD (coronary artery disease) 6/14    Cardiac catheterization 6/27/ 2014 ejection fraction 35-40%, 30% proximal left circumflex, 100% tiny obtuse marginal 1 with collaterals, 50% LAD, 50% D1, 100% RCA with collaterals  . Hypertension   . Hyperlipidemia   . Kidney stone   . Colon polyps   . Diabetes mellitus ([place])     Diet controlled   Past Surgical History  Procedure Laterality Date  . Tonsillectomy  1963    reports that he has never smoked. He does not have any smokeless tobacco history on file. He reports that he does not drink alcohol or use illicit drugs. family history includes Arthritis in his father and paternal grandmother; Heart disease in his maternal grandmother; Hyperlipidemia in his maternal grandmother. Allergies  Allergen Reactions  . Sulfa Antibiotics Rash      Review of Systems  Constitutional: Negative for fever and chills.  Gastrointestinal: Negative for nausea, vomiting, abdominal pain, diarrhea, constipation and blood in stool.       Objective:   Physical Exam  Constitutional: He appears well-developed and well-nourished.    Cardiovascular: Normal rate and regular rhythm.   Pulmonary/Chest: Effort normal and breath sounds normal. No respiratory distress. He has no wheezes. He has no rales.  Abdominal: Soft. Bowel sounds are normal. He exhibits no distension and no mass. There is no tenderness. There is no rebound and no guarding.  Genitourinary:  Ventral surface of penis very small approximately 1-2 mm appears to be abrasion. No signs of secondary infection. No visible vesicles. No nodules.          Assessment & Plan:  Increased flatulence and gas. Back down fiber intake. Continue regular exercise. He states Librax has helped the past and he will use this as needed  Very small? Abrasion ventral surface of penis. No signs of secondary infection. No hx of herpes and no recent vesicle.  Reassurance. Follow-up as needed for any signs of secondary infection.  [name] MD [facility] at [facility]

## 2015-08-20 DIAGNOSIS — M50922 Unspecified cervical disc disorder at C5-C6 level: Secondary | ICD-10-CM | POA: Diagnosis not present

## 2015-08-20 DIAGNOSIS — M25511 Pain in right shoulder: Secondary | ICD-10-CM | POA: Diagnosis not present

## 2015-08-20 DIAGNOSIS — M50923 Unspecified cervical disc disorder at C6-C7 level: Secondary | ICD-10-CM | POA: Diagnosis not present

## 2015-08-20 DIAGNOSIS — M545 Low back pain: Secondary | ICD-10-CM | POA: Diagnosis not present

## 2015-08-20 DIAGNOSIS — M5136 Other intervertebral disc degeneration, lumbar region: Secondary | ICD-10-CM | POA: Diagnosis not present

## 2015-08-21 ENCOUNTER — Telehealth: Payer: Self-pay | Admitting: Family Medicine

## 2015-08-21 ENCOUNTER — Ambulatory Visit (INDEPENDENT_AMBULATORY_CARE_PROVIDER_SITE_OTHER): Payer: BLUE CROSS/BLUE SHIELD | Admitting: Family Medicine

## 2015-08-21 ENCOUNTER — Encounter: Payer: Self-pay | Admitting: Family Medicine

## 2015-08-21 VITALS — BP 110/62 | HR 64 | Temp 98.4°F | Resp 20 | Ht 74.0 in | Wt 206.0 lb

## 2015-08-21 DIAGNOSIS — F411 Generalized anxiety disorder: Secondary | ICD-10-CM | POA: Diagnosis not present

## 2015-08-21 DIAGNOSIS — M940 Chondrocostal junction syndrome [Tietze]: Secondary | ICD-10-CM

## 2015-08-21 NOTE — Progress Notes (Signed)
   Subjective:    Patient ID: Peter Snyder, male    DOB: May 02, 1955, 60 y.o.   MRN: [id number]  HPI Here for left sided chest pains that started this morning while he was getting a massage. He was being rubbed on the back while he laid on his chest and abdomen. The pains are mild but sharp. They are not related to exertion. No SOB or sweats. He has been followed closely by Cardiology. He is on maximum treatment for esophagitis.    Review of Systems  Constitutional: Negative.   Respiratory: Negative.   Cardiovascular: Positive for chest pain. Negative for palpitations and leg swelling.  Neurological: Negative.        Objective:   Physical Exam  Constitutional: He is oriented to person, place, and time. He appears well-developed and well-nourished. No distress.  Neck: No thyromegaly present.  Cardiovascular: Normal rate, regular rhythm, normal heart sounds and intact distal pulses.   Pulmonary/Chest: Effort normal and breath sounds normal. No respiratory distress. He has no wheezes. He has no rales.  Tender along the left sternal margin   Lymphadenopathy:    He has no cervical adenopathy.  Neurological: He is alert and oriented to person, place, and time.          Assessment & Plan:  Costochondritis. He is already on Celebrex. Reassured this is benign and it will go away on its own. Recheck prn.  Laurey Morale, MD

## 2015-08-21 NOTE — Telephone Encounter (Signed)
Appointment with Dr. Sarajane Jews today (6/6) at 4.

## 2015-08-21 NOTE — Progress Notes (Signed)
Pre visit review using our clinic review tool, if applicable. No additional management support is needed unless otherwise documented below in the visit note. 

## 2015-08-21 NOTE — Telephone Encounter (Signed)
Patient Name: Peter Snyder DOB: 1955-09-24 Initial Comment Caller states he has been on Carafate for Acid reflux and Protonix. Chest discomfort. Nurse Assessment Nurse: Ronnald Ramp, RN, Miranda Date/Time (Eastern Time): 08/21/2015 12:17:44 PM Confirm and document reason for call. If symptomatic, describe symptoms. You must click the next button to save text entered. ---Caller states he has been prescribed Protonix and Carafat for GERD and epigastric pain. He has a burning sensation in his upper abdomen. He does think he missed a dose of Carafat yesterday. Has the patient traveled out of the country within the last 30 days? ---No Does the patient have any new or worsening symptoms? ---Yes Will a triage be completed? ---Yes Related visit to physician within the last 2 weeks? ---Yes Does the PT have any chronic conditions? (i.e. diabetes, asthma, etc.) ---Yes List chronic conditions. ---Arthritis, Neuropathy, GERD, Is this a behavioral health or substance abuse call? ---No Guidelines Guideline Title Affirmed Question Affirmed Notes Abdominal Pain - Upper [1] MILD-MODERATE pain AND [2] not relieved by antacids Final Disposition User See Physician within 4 Hours (or PCP triage) Ronnald Ramp, RN, Miranda Comments No appt available with PCP, appt scheduled for 4pm with Sarajane Jews Referrals REFERRED TO PCP OFFICE Disagree/Comply: Comply

## 2015-08-23 DIAGNOSIS — M25571 Pain in right ankle and joints of right foot: Secondary | ICD-10-CM | POA: Diagnosis not present

## 2015-08-23 DIAGNOSIS — M65871 Other synovitis and tenosynovitis, right ankle and foot: Secondary | ICD-10-CM | POA: Diagnosis not present

## 2015-08-24 DIAGNOSIS — M7501 Adhesive capsulitis of right shoulder: Secondary | ICD-10-CM | POA: Diagnosis not present

## 2015-08-27 DIAGNOSIS — M50923 Unspecified cervical disc disorder at C6-C7 level: Secondary | ICD-10-CM | POA: Diagnosis not present

## 2015-08-27 DIAGNOSIS — L570 Actinic keratosis: Secondary | ICD-10-CM | POA: Diagnosis not present

## 2015-08-27 DIAGNOSIS — M50922 Unspecified cervical disc disorder at C5-C6 level: Secondary | ICD-10-CM | POA: Diagnosis not present

## 2015-08-27 DIAGNOSIS — D485 Neoplasm of uncertain behavior of skin: Secondary | ICD-10-CM | POA: Diagnosis not present

## 2015-08-27 DIAGNOSIS — M25511 Pain in right shoulder: Secondary | ICD-10-CM | POA: Diagnosis not present

## 2015-08-27 DIAGNOSIS — M5136 Other intervertebral disc degeneration, lumbar region: Secondary | ICD-10-CM | POA: Diagnosis not present

## 2015-08-27 DIAGNOSIS — D074 Carcinoma in situ of penis: Secondary | ICD-10-CM | POA: Diagnosis not present

## 2015-08-28 DIAGNOSIS — F411 Generalized anxiety disorder: Secondary | ICD-10-CM | POA: Diagnosis not present

## 2015-08-30 DIAGNOSIS — M5136 Other intervertebral disc degeneration, lumbar region: Secondary | ICD-10-CM | POA: Diagnosis not present

## 2015-08-30 DIAGNOSIS — M50922 Unspecified cervical disc disorder at C5-C6 level: Secondary | ICD-10-CM | POA: Diagnosis not present

## 2015-08-30 DIAGNOSIS — M50923 Unspecified cervical disc disorder at C6-C7 level: Secondary | ICD-10-CM | POA: Diagnosis not present

## 2015-08-30 DIAGNOSIS — M25511 Pain in right shoulder: Secondary | ICD-10-CM | POA: Diagnosis not present

## 2015-08-31 DIAGNOSIS — M65871 Other synovitis and tenosynovitis, right ankle and foot: Secondary | ICD-10-CM | POA: Diagnosis not present

## 2015-08-31 DIAGNOSIS — M25571 Pain in right ankle and joints of right foot: Secondary | ICD-10-CM | POA: Diagnosis not present

## 2015-09-04 DIAGNOSIS — M50922 Unspecified cervical disc disorder at C5-C6 level: Secondary | ICD-10-CM | POA: Diagnosis not present

## 2015-09-04 DIAGNOSIS — G894 Chronic pain syndrome: Secondary | ICD-10-CM | POA: Diagnosis not present

## 2015-09-04 DIAGNOSIS — M5136 Other intervertebral disc degeneration, lumbar region: Secondary | ICD-10-CM | POA: Diagnosis not present

## 2015-09-04 DIAGNOSIS — M50923 Unspecified cervical disc disorder at C6-C7 level: Secondary | ICD-10-CM | POA: Diagnosis not present

## 2015-09-04 DIAGNOSIS — M50322 Other cervical disc degeneration at C5-C6 level: Secondary | ICD-10-CM | POA: Diagnosis not present

## 2015-09-04 DIAGNOSIS — M25511 Pain in right shoulder: Secondary | ICD-10-CM | POA: Diagnosis not present

## 2015-09-05 DIAGNOSIS — F411 Generalized anxiety disorder: Secondary | ICD-10-CM | POA: Diagnosis not present

## 2015-09-06 DIAGNOSIS — M5136 Other intervertebral disc degeneration, lumbar region: Secondary | ICD-10-CM | POA: Diagnosis not present

## 2015-09-06 DIAGNOSIS — M25511 Pain in right shoulder: Secondary | ICD-10-CM | POA: Diagnosis not present

## 2015-09-06 DIAGNOSIS — M50922 Unspecified cervical disc disorder at C5-C6 level: Secondary | ICD-10-CM | POA: Diagnosis not present

## 2015-09-06 DIAGNOSIS — M50923 Unspecified cervical disc disorder at C6-C7 level: Secondary | ICD-10-CM | POA: Diagnosis not present

## 2015-09-07 DIAGNOSIS — M25511 Pain in right shoulder: Secondary | ICD-10-CM | POA: Diagnosis not present

## 2015-09-07 DIAGNOSIS — M5136 Other intervertebral disc degeneration, lumbar region: Secondary | ICD-10-CM | POA: Diagnosis not present

## 2015-09-07 DIAGNOSIS — M50923 Unspecified cervical disc disorder at C6-C7 level: Secondary | ICD-10-CM | POA: Diagnosis not present

## 2015-09-07 DIAGNOSIS — M50922 Unspecified cervical disc disorder at C5-C6 level: Secondary | ICD-10-CM | POA: Diagnosis not present

## 2015-09-11 DIAGNOSIS — D048 Carcinoma in situ of skin of other sites: Secondary | ICD-10-CM | POA: Diagnosis not present

## 2015-09-17 DIAGNOSIS — M5136 Other intervertebral disc degeneration, lumbar region: Secondary | ICD-10-CM | POA: Diagnosis not present

## 2015-09-17 DIAGNOSIS — M25511 Pain in right shoulder: Secondary | ICD-10-CM | POA: Diagnosis not present

## 2015-09-17 DIAGNOSIS — M50923 Unspecified cervical disc disorder at C6-C7 level: Secondary | ICD-10-CM | POA: Diagnosis not present

## 2015-09-17 DIAGNOSIS — M50922 Unspecified cervical disc disorder at C5-C6 level: Secondary | ICD-10-CM | POA: Diagnosis not present

## 2015-09-19 DIAGNOSIS — M50922 Unspecified cervical disc disorder at C5-C6 level: Secondary | ICD-10-CM | POA: Diagnosis not present

## 2015-09-19 DIAGNOSIS — M5136 Other intervertebral disc degeneration, lumbar region: Secondary | ICD-10-CM | POA: Diagnosis not present

## 2015-09-19 DIAGNOSIS — M545 Low back pain: Secondary | ICD-10-CM | POA: Diagnosis not present

## 2015-09-19 DIAGNOSIS — M50923 Unspecified cervical disc disorder at C6-C7 level: Secondary | ICD-10-CM | POA: Diagnosis not present

## 2015-09-19 DIAGNOSIS — M25511 Pain in right shoulder: Secondary | ICD-10-CM | POA: Diagnosis not present

## 2015-09-20 DIAGNOSIS — M25571 Pain in right ankle and joints of right foot: Secondary | ICD-10-CM | POA: Diagnosis not present

## 2015-09-20 DIAGNOSIS — M65871 Other synovitis and tenosynovitis, right ankle and foot: Secondary | ICD-10-CM | POA: Diagnosis not present

## 2015-09-25 DIAGNOSIS — M5136 Other intervertebral disc degeneration, lumbar region: Secondary | ICD-10-CM | POA: Diagnosis not present

## 2015-09-25 DIAGNOSIS — M25511 Pain in right shoulder: Secondary | ICD-10-CM | POA: Diagnosis not present

## 2015-09-25 DIAGNOSIS — M50923 Unspecified cervical disc disorder at C6-C7 level: Secondary | ICD-10-CM | POA: Diagnosis not present

## 2015-09-25 DIAGNOSIS — M50922 Unspecified cervical disc disorder at C5-C6 level: Secondary | ICD-10-CM | POA: Diagnosis not present

## 2015-09-26 ENCOUNTER — Ambulatory Visit (INDEPENDENT_AMBULATORY_CARE_PROVIDER_SITE_OTHER): Payer: BLUE CROSS/BLUE SHIELD | Admitting: Family Medicine

## 2015-09-26 ENCOUNTER — Encounter: Payer: Self-pay | Admitting: Family Medicine

## 2015-09-26 VITALS — BP 124/76 | HR 106 | Temp 98.6°F | Ht 74.0 in | Wt 208.3 lb

## 2015-09-26 DIAGNOSIS — R300 Dysuria: Secondary | ICD-10-CM

## 2015-09-26 DIAGNOSIS — F411 Generalized anxiety disorder: Secondary | ICD-10-CM | POA: Diagnosis not present

## 2015-09-26 LAB — POCT URINALYSIS DIPSTICK
BILIRUBIN UA: NEGATIVE
Blood, UA: NEGATIVE
Glucose, UA: NEGATIVE
Ketones, UA: NEGATIVE
LEUKOCYTES UA: NEGATIVE
NITRITE UA: NEGATIVE
PH UA: 6.5
PROTEIN UA: NEGATIVE
Spec Grav, UA: <=1.005
Urobilinogen, UA: 0.2

## 2015-09-26 NOTE — Progress Notes (Signed)
Pre visit review using our clinic review tool, if applicable. No additional management support is needed unless otherwise documented below in the visit note. 

## 2015-09-26 NOTE — Progress Notes (Signed)
   Subjective:    Patient ID: Peter Snyder, male    DOB: 1955/07/04, 60 y.o.   MRN: [id number]  HPI One-day history of mild burning with urination. Return from walking this morning and noted some mild burning. No slow stream. Denies any fevers or chills. No hematuria. Prior history of kidney stones. Denies any recent flank pain or abdominal pain. Subsequent urination this afternoon did not reveal any burning. Already takes Flomax.    Past Medical History  Diagnosis Date  . Arthritis   . Chicken pox   . Depression   . GERD (gastroesophageal reflux disease)   . CAD (coronary artery disease) 6/14    Cardiac catheterization 6/27/ 2014 ejection fraction 35-40%, 30% proximal left circumflex, 100% tiny obtuse marginal 1 with collaterals, 50% LAD, 50% D1, 100% RCA with collaterals  . Hypertension   . Hyperlipidemia   . Kidney stone   . Colon polyps   . Diabetes mellitus ([place])     Diet controlled   Past Surgical History  Procedure Laterality Date  . Tonsillectomy  1963    reports that he has never smoked. He does not have any smokeless tobacco history on file. He reports that he does not drink alcohol or use illicit drugs. family history includes Arthritis in his father and paternal grandmother; Heart disease in his maternal grandmother; Hyperlipidemia in his maternal grandmother. Allergies  Allergen Reactions  . Sulfa Antibiotics Rash  '   Review of Systems  Constitutional: Negative for fever and chills.  Genitourinary: Positive for dysuria. Negative for hematuria.       Objective:   Physical Exam  Constitutional: He appears well-developed and well-nourished.  Cardiovascular: Normal rate and regular rhythm.   Pulmonary/Chest: Effort normal and breath sounds normal. No respiratory distress. He has no wheezes. He has no rales.  Genitourinary:  Prostate is only minimally tender and non-boggy- doubt significant.  No rectal mass.          Assessment & Plan:  Dysuria. Urine  dipstick is unremarkable. No evidence for infection. Reassurance.  [name] MD [facility] at [facility]

## 2015-09-27 DIAGNOSIS — M79644 Pain in right finger(s): Secondary | ICD-10-CM | POA: Diagnosis not present

## 2015-09-27 DIAGNOSIS — M65331 Trigger finger, right middle finger: Secondary | ICD-10-CM | POA: Diagnosis not present

## 2015-09-27 DIAGNOSIS — M5136 Other intervertebral disc degeneration, lumbar region: Secondary | ICD-10-CM | POA: Diagnosis not present

## 2015-09-27 DIAGNOSIS — M25511 Pain in right shoulder: Secondary | ICD-10-CM | POA: Diagnosis not present

## 2015-09-27 DIAGNOSIS — M50923 Unspecified cervical disc disorder at C6-C7 level: Secondary | ICD-10-CM | POA: Diagnosis not present

## 2015-09-27 DIAGNOSIS — M50922 Unspecified cervical disc disorder at C5-C6 level: Secondary | ICD-10-CM | POA: Diagnosis not present

## 2015-09-28 DIAGNOSIS — H1045 Other chronic allergic conjunctivitis: Secondary | ICD-10-CM | POA: Diagnosis not present

## 2015-10-01 DIAGNOSIS — M25571 Pain in right ankle and joints of right foot: Secondary | ICD-10-CM | POA: Diagnosis not present

## 2015-10-01 DIAGNOSIS — M65871 Other synovitis and tenosynovitis, right ankle and foot: Secondary | ICD-10-CM | POA: Diagnosis not present

## 2015-10-02 DIAGNOSIS — M25511 Pain in right shoulder: Secondary | ICD-10-CM | POA: Diagnosis not present

## 2015-10-02 DIAGNOSIS — M50922 Unspecified cervical disc disorder at C5-C6 level: Secondary | ICD-10-CM | POA: Diagnosis not present

## 2015-10-02 DIAGNOSIS — M50923 Unspecified cervical disc disorder at C6-C7 level: Secondary | ICD-10-CM | POA: Diagnosis not present

## 2015-10-02 DIAGNOSIS — M5136 Other intervertebral disc degeneration, lumbar region: Secondary | ICD-10-CM | POA: Diagnosis not present

## 2015-10-03 DIAGNOSIS — F411 Generalized anxiety disorder: Secondary | ICD-10-CM | POA: Diagnosis not present

## 2015-10-04 ENCOUNTER — Other Ambulatory Visit: Payer: Self-pay | Admitting: General Practice

## 2015-10-04 MED ORDER — EZETIMIBE 10 MG PO TABS: 10.0000 mg | ORAL_TABLET | Freq: Every day | ORAL | Status: DC

## 2015-10-10 DIAGNOSIS — F411 Generalized anxiety disorder: Secondary | ICD-10-CM | POA: Diagnosis not present

## 2015-10-15 ENCOUNTER — Ambulatory Visit (INDEPENDENT_AMBULATORY_CARE_PROVIDER_SITE_OTHER): Payer: BLUE CROSS/BLUE SHIELD | Admitting: Family Medicine

## 2015-10-15 ENCOUNTER — Encounter: Payer: Self-pay | Admitting: Family Medicine

## 2015-10-15 VITALS — BP 120/78 | HR 96 | Temp 97.9°F | Ht 74.0 in | Wt 206.7 lb

## 2015-10-15 DIAGNOSIS — R253 Fasciculation: Secondary | ICD-10-CM

## 2015-10-15 DIAGNOSIS — M5136 Other intervertebral disc degeneration, lumbar region: Secondary | ICD-10-CM | POA: Diagnosis not present

## 2015-10-15 DIAGNOSIS — M50923 Unspecified cervical disc disorder at C6-C7 level: Secondary | ICD-10-CM | POA: Diagnosis not present

## 2015-10-15 DIAGNOSIS — M25511 Pain in right shoulder: Secondary | ICD-10-CM | POA: Diagnosis not present

## 2015-10-15 DIAGNOSIS — M50922 Unspecified cervical disc disorder at C5-C6 level: Secondary | ICD-10-CM | POA: Diagnosis not present

## 2015-10-15 MED ORDER — PANTOPRAZOLE SODIUM 40 MG PO TBEC: 40.0000 mg | DELAYED_RELEASE_TABLET | Freq: Two times a day (BID) | ORAL | 11 refills | Status: DC

## 2015-10-15 NOTE — Patient Instructions (Signed)
You are describing fasciculations which are benign muscle twitches I would not make any changes in your medications now.

## 2015-10-15 NOTE — Progress Notes (Signed)
Subjective:     Patient ID: Peter Snyder, male   DOB: February 17, 1956, 60 y.o.   MRN: [id number]  HPI Patient seen with involuntary muscle "twitches" involving mostly his left upper lid over the past 3 or 4 weeks. He denies any muscle cramps. No other muscles involved. Has already seen his eye doctor who did not find any major abnormalities. Patient had basic metabolic panel in May which was basically normal. Denies any eyelid pain. No visual difficulties.  Past Medical History:  Diagnosis Date  . Arthritis   . CAD (coronary artery disease) 6/14   Cardiac catheterization 6/27/ 2014 ejection fraction 35-40%, 30% proximal left circumflex, 100% tiny obtuse marginal 1 with collaterals, 50% LAD, 50% D1, 100% RCA with collaterals  . Chicken pox   . Colon polyps   . Depression   . Diabetes mellitus ([place])    Diet controlled  . GERD (gastroesophageal reflux disease)   . Hyperlipidemia   . Hypertension   . Kidney stone    Past Surgical History:  Procedure Laterality Date  . TONSILLECTOMY  1963    reports that he has never smoked. He does not have any smokeless tobacco history on file. He reports that he does not drink alcohol or use drugs. family history includes Arthritis in his father and paternal grandmother; Heart disease in his maternal grandmother; Hyperlipidemia in his maternal grandmother. Allergies  Allergen Reactions  . Sulfa Antibiotics Rash     Review of Systems  Neurological: Negative for dizziness, weakness, numbness and headaches.       Objective:   Physical Exam  Constitutional: He is oriented to person, place, and time. He appears well-developed and well-nourished.  Eyes: EOM are normal. Pupils are equal, round, and reactive to light. Right eye exhibits no discharge. Left eye exhibits no discharge.  Cardiovascular: Normal rate and regular rhythm.   Pulmonary/Chest: Effort normal and breath sounds normal. No respiratory distress. He has no wheezes. He has no rales.   Neurological: He is alert and oriented to person, place, and time. No cranial nerve deficit.       Assessment:     Benign fasciculations involving his eyelid    Plan:     -Reassurance is given. -We explained that we did not think this is likely related to his medications. -Expect these will go away soon over the next couple weeks  [name] MD [facility] at [facility]

## 2015-10-20 DIAGNOSIS — M545 Low back pain: Secondary | ICD-10-CM | POA: Diagnosis not present

## 2015-10-22 DIAGNOSIS — M50922 Unspecified cervical disc disorder at C5-C6 level: Secondary | ICD-10-CM | POA: Diagnosis not present

## 2015-10-22 DIAGNOSIS — M5136 Other intervertebral disc degeneration, lumbar region: Secondary | ICD-10-CM | POA: Diagnosis not present

## 2015-10-22 DIAGNOSIS — M25511 Pain in right shoulder: Secondary | ICD-10-CM | POA: Diagnosis not present

## 2015-10-22 DIAGNOSIS — M50923 Unspecified cervical disc disorder at C6-C7 level: Secondary | ICD-10-CM | POA: Diagnosis not present

## 2015-10-23 DIAGNOSIS — D048 Carcinoma in situ of skin of other sites: Secondary | ICD-10-CM | POA: Diagnosis not present

## 2015-10-24 DIAGNOSIS — F411 Generalized anxiety disorder: Secondary | ICD-10-CM | POA: Diagnosis not present

## 2015-10-26 DIAGNOSIS — M5136 Other intervertebral disc degeneration, lumbar region: Secondary | ICD-10-CM | POA: Diagnosis not present

## 2015-10-26 DIAGNOSIS — M25511 Pain in right shoulder: Secondary | ICD-10-CM | POA: Diagnosis not present

## 2015-10-26 DIAGNOSIS — M50923 Unspecified cervical disc disorder at C6-C7 level: Secondary | ICD-10-CM | POA: Diagnosis not present

## 2015-10-26 DIAGNOSIS — M50922 Unspecified cervical disc disorder at C5-C6 level: Secondary | ICD-10-CM | POA: Diagnosis not present

## 2015-10-29 ENCOUNTER — Encounter: Payer: Self-pay | Admitting: Family Medicine

## 2015-10-29 ENCOUNTER — Ambulatory Visit (INDEPENDENT_AMBULATORY_CARE_PROVIDER_SITE_OTHER): Payer: BLUE CROSS/BLUE SHIELD | Admitting: Family Medicine

## 2015-10-29 VITALS — BP 130/80 | HR 90 | Temp 98.3°F | Ht 74.0 in | Wt 207.8 lb

## 2015-10-29 DIAGNOSIS — M65331 Trigger finger, right middle finger: Secondary | ICD-10-CM | POA: Diagnosis not present

## 2015-10-29 DIAGNOSIS — R300 Dysuria: Secondary | ICD-10-CM

## 2015-10-29 DIAGNOSIS — J0100 Acute maxillary sinusitis, unspecified: Secondary | ICD-10-CM

## 2015-10-29 MED ORDER — AMOXICILLIN-POT CLAVULANATE 875-125 MG PO TABS: 1.0000 | ORAL_TABLET | Freq: Two times a day (BID) | ORAL | 0 refills | Status: DC

## 2015-10-29 NOTE — Patient Instructions (Signed)

## 2015-10-29 NOTE — Progress Notes (Signed)
Pre visit review using our clinic review tool, if applicable. No additional management support is needed unless otherwise documented below in the visit note. 

## 2015-10-29 NOTE — Progress Notes (Signed)
Subjective:     Patient ID: Peter Snyder, male   DOB: June 03, 1955, 60 y.o.   MRN: [id number]  HPI Patient seen with three-week history of facial pain and nasal congestion. Seemed to be getting slightly better a week ago but now has relapse. He's had some bloody and greenish nasal discharge. Bilateral maxillary sinus pressure. Occasional lightheadedness. Intermittent headaches. No fevers. No nausea or vomiting.  Also complains of 1 day history of mild burning with urination. No penile discharge. No fevers or chills. Monogamous. No history of urethritis.  Past Medical History:  Diagnosis Date  . Arthritis   . CAD (coronary artery disease) 6/14   Cardiac catheterization 6/27/ 2014 ejection fraction 35-40%, 30% proximal left circumflex, 100% tiny obtuse marginal 1 with collaterals, 50% LAD, 50% D1, 100% RCA with collaterals  . Chicken pox   . Colon polyps   . Depression   . Diabetes mellitus ([place])    Diet controlled  . GERD (gastroesophageal reflux disease)   . Hyperlipidemia   . Hypertension   . Kidney stone    Past Surgical History:  Procedure Laterality Date  . TONSILLECTOMY  1963    reports that he has never smoked. He does not have any smokeless tobacco history on file. He reports that he does not drink alcohol or use drugs. family history includes Arthritis in his father and paternal grandmother; Heart disease in his maternal grandmother; Hyperlipidemia in his maternal grandmother. Allergies  Allergen Reactions  . Sulfa Antibiotics Rash     Review of Systems  Constitutional: Positive for fatigue.  HENT: Positive for congestion and sinus pressure.   Respiratory: Negative for cough and shortness of breath.   Cardiovascular: Negative for chest pain.  Genitourinary: Positive for dysuria.  Neurological: Positive for headaches.       Objective:   Physical Exam  Constitutional: He appears well-developed and well-nourished.  HENT:  Right Ear: External ear normal.  Left Ear:  External ear normal.  Mouth/Throat: Oropharynx is clear and moist.  Nasal mucosa erythematous bilaterally. He has some yellow-green mucous right naris  Neck: Neck supple.  Cardiovascular: Normal rate.   Pulmonary/Chest: Effort normal and breath sounds normal. No respiratory distress. He has no wheezes. He has no rales.       Assessment:     #1 dysuria. Urine dipstick is completely normal. Likelihood of infection is very low  #2 probable bilateral bacterial maxillary sinusitis    Plan:     -Given duration of symptoms, start Augmentin 875 mg twice daily -Stable well hydrated. -Continue nasal saline irrigation  [name] MD [facility] at [facility]

## 2015-10-31 DIAGNOSIS — M50923 Unspecified cervical disc disorder at C6-C7 level: Secondary | ICD-10-CM | POA: Diagnosis not present

## 2015-10-31 DIAGNOSIS — M50922 Unspecified cervical disc disorder at C5-C6 level: Secondary | ICD-10-CM | POA: Diagnosis not present

## 2015-10-31 DIAGNOSIS — M25511 Pain in right shoulder: Secondary | ICD-10-CM | POA: Diagnosis not present

## 2015-10-31 DIAGNOSIS — M5136 Other intervertebral disc degeneration, lumbar region: Secondary | ICD-10-CM | POA: Diagnosis not present

## 2015-11-01 DIAGNOSIS — F411 Generalized anxiety disorder: Secondary | ICD-10-CM | POA: Diagnosis not present

## 2015-11-05 DIAGNOSIS — M50923 Unspecified cervical disc disorder at C6-C7 level: Secondary | ICD-10-CM | POA: Diagnosis not present

## 2015-11-05 DIAGNOSIS — M50922 Unspecified cervical disc disorder at C5-C6 level: Secondary | ICD-10-CM | POA: Diagnosis not present

## 2015-11-05 DIAGNOSIS — M25511 Pain in right shoulder: Secondary | ICD-10-CM | POA: Diagnosis not present

## 2015-11-05 DIAGNOSIS — M5136 Other intervertebral disc degeneration, lumbar region: Secondary | ICD-10-CM | POA: Diagnosis not present

## 2015-11-07 DIAGNOSIS — F411 Generalized anxiety disorder: Secondary | ICD-10-CM | POA: Diagnosis not present

## 2015-11-09 ENCOUNTER — Other Ambulatory Visit: Payer: Self-pay | Admitting: Family Medicine

## 2015-11-12 DIAGNOSIS — M50922 Unspecified cervical disc disorder at C5-C6 level: Secondary | ICD-10-CM | POA: Diagnosis not present

## 2015-11-12 DIAGNOSIS — M50923 Unspecified cervical disc disorder at C6-C7 level: Secondary | ICD-10-CM | POA: Diagnosis not present

## 2015-11-12 DIAGNOSIS — M25511 Pain in right shoulder: Secondary | ICD-10-CM | POA: Diagnosis not present

## 2015-11-12 DIAGNOSIS — M5136 Other intervertebral disc degeneration, lumbar region: Secondary | ICD-10-CM | POA: Diagnosis not present

## 2015-11-13 ENCOUNTER — Encounter: Payer: Self-pay | Admitting: Cardiology

## 2015-11-13 DIAGNOSIS — M12271 Villonodular synovitis (pigmented), right ankle and foot: Secondary | ICD-10-CM | POA: Diagnosis not present

## 2015-11-13 DIAGNOSIS — M792 Neuralgia and neuritis, unspecified: Secondary | ICD-10-CM | POA: Diagnosis not present

## 2015-11-13 DIAGNOSIS — M722 Plantar fascial fibromatosis: Secondary | ICD-10-CM | POA: Diagnosis not present

## 2015-11-13 DIAGNOSIS — M25571 Pain in right ankle and joints of right foot: Secondary | ICD-10-CM | POA: Diagnosis not present

## 2015-11-14 DIAGNOSIS — L570 Actinic keratosis: Secondary | ICD-10-CM | POA: Diagnosis not present

## 2015-11-14 DIAGNOSIS — F411 Generalized anxiety disorder: Secondary | ICD-10-CM | POA: Diagnosis not present

## 2015-11-14 DIAGNOSIS — L859 Epidermal thickening, unspecified: Secondary | ICD-10-CM | POA: Diagnosis not present

## 2015-11-14 DIAGNOSIS — L309 Dermatitis, unspecified: Secondary | ICD-10-CM | POA: Diagnosis not present

## 2015-11-14 DIAGNOSIS — M79644 Pain in right finger(s): Secondary | ICD-10-CM | POA: Diagnosis not present

## 2015-11-14 DIAGNOSIS — L304 Erythema intertrigo: Secondary | ICD-10-CM | POA: Diagnosis not present

## 2015-11-14 DIAGNOSIS — L820 Inflamed seborrheic keratosis: Secondary | ICD-10-CM | POA: Diagnosis not present

## 2015-11-14 DIAGNOSIS — M65331 Trigger finger, right middle finger: Secondary | ICD-10-CM | POA: Diagnosis not present

## 2015-11-14 DIAGNOSIS — M65311 Trigger thumb, right thumb: Secondary | ICD-10-CM | POA: Diagnosis not present

## 2015-11-20 DIAGNOSIS — M545 Low back pain: Secondary | ICD-10-CM | POA: Diagnosis not present

## 2015-11-20 DIAGNOSIS — L700 Acne vulgaris: Secondary | ICD-10-CM | POA: Diagnosis not present

## 2015-11-20 DIAGNOSIS — L570 Actinic keratosis: Secondary | ICD-10-CM | POA: Diagnosis not present

## 2015-11-26 DIAGNOSIS — M50922 Unspecified cervical disc disorder at C5-C6 level: Secondary | ICD-10-CM | POA: Diagnosis not present

## 2015-11-26 DIAGNOSIS — M5136 Other intervertebral disc degeneration, lumbar region: Secondary | ICD-10-CM | POA: Diagnosis not present

## 2015-11-26 DIAGNOSIS — M25511 Pain in right shoulder: Secondary | ICD-10-CM | POA: Diagnosis not present

## 2015-11-26 DIAGNOSIS — M50923 Unspecified cervical disc disorder at C6-C7 level: Secondary | ICD-10-CM | POA: Diagnosis not present

## 2015-11-27 DIAGNOSIS — G894 Chronic pain syndrome: Secondary | ICD-10-CM | POA: Diagnosis not present

## 2015-11-27 DIAGNOSIS — M5136 Other intervertebral disc degeneration, lumbar region: Secondary | ICD-10-CM | POA: Diagnosis not present

## 2015-11-27 DIAGNOSIS — M50322 Other cervical disc degeneration at C5-C6 level: Secondary | ICD-10-CM | POA: Diagnosis not present

## 2015-11-27 DIAGNOSIS — D048 Carcinoma in situ of skin of other sites: Secondary | ICD-10-CM | POA: Diagnosis not present

## 2015-11-28 ENCOUNTER — Ambulatory Visit (INDEPENDENT_AMBULATORY_CARE_PROVIDER_SITE_OTHER): Payer: BLUE CROSS/BLUE SHIELD | Admitting: Family Medicine

## 2015-11-28 ENCOUNTER — Encounter: Payer: Self-pay | Admitting: Family Medicine

## 2015-11-28 VITALS — BP 108/70 | HR 91 | Temp 97.8°F | Ht 74.0 in | Wt 205.0 lb

## 2015-11-28 DIAGNOSIS — M542 Cervicalgia: Secondary | ICD-10-CM | POA: Diagnosis not present

## 2015-11-28 DIAGNOSIS — Z23 Encounter for immunization: Secondary | ICD-10-CM | POA: Diagnosis not present

## 2015-11-28 DIAGNOSIS — F411 Generalized anxiety disorder: Secondary | ICD-10-CM | POA: Diagnosis not present

## 2015-11-28 NOTE — Progress Notes (Signed)
Subjective:     Patient ID: Peter Snyder, male   DOB: 31-Aug-1955, 60 y.o.   MRN: [id number]  HPI  Patient seen with right sided neck pain over the past couple days. He describes intermittent sharp pain right neck also has couple of ulcers in his mouth. He denies any sore throat. No pain with swallowing. No chest pain. Walks for exercise and has had no exertional symptoms whatsoever. No left neck pain. Occasional right TMJ pain with chewing and eating. No other exacerbating factors. He has not noted any lymphadenopathy or neck masses.  Past Medical History:  Diagnosis Date  . Arthritis   . CAD (coronary artery disease) 6/14   Cardiac catheterization 6/27/ 2014 ejection fraction 35-40%, 30% proximal left circumflex, 100% tiny obtuse marginal 1 with collaterals, 50% LAD, 50% D1, 100% RCA with collaterals  . Chicken pox   . Colon polyps   . Depression   . Diabetes mellitus ([place])    Diet controlled  . GERD (gastroesophageal reflux disease)   . Hyperlipidemia   . Hypertension   . Kidney stone    Past Surgical History:  Procedure Laterality Date  . TONSILLECTOMY  1963    reports that he has never smoked. He does not have any smokeless tobacco history on file. He reports that he does not drink alcohol or use drugs. family history includes Arthritis in his father and paternal grandmother; Heart disease in his maternal grandmother; Hyperlipidemia in his maternal grandmother. Allergies  Allergen Reactions  . Sulfa Antibiotics Rash     Review of Systems  Constitutional: Negative for chills and fever.  HENT: Positive for congestion. Negative for sore throat.   Respiratory: Negative for shortness of breath.   Cardiovascular: Negative for chest pain.       Objective:   Physical Exam  Constitutional: He appears well-developed and well-nourished.  HENT:  Right Ear: External ear normal.  Left Ear: External ear normal.  Mouth/Throat: Oropharynx is clear and moist. No oropharyngeal exudate.   Could not appreciate any aphthous ulcers or other mouth ulcers. No exudate Minimal tenderness over the right TMJ joint  Neck: Neck supple.  Cardiovascular: Normal rate.   Pulmonary/Chest: Effort normal and breath sounds normal. No respiratory distress. He has no wheezes. He has no rales.  Lymphadenopathy:    He has no cervical adenopathy.       Assessment:     Right neck pain. Etiology unclear. Nonfocal exam. He does have some mild tenderness over the right TMJ joint otherwise nonfocal exam. Oropharyngeal exam unremarkable. No adenopathy noted.    Plan:     -Reassurance. He has mouth guard to wear at night to protect against any bruxism -Follow-up for any fever, lymphadenopathy, or other concerns  [name] MD [facility] at [facility]

## 2015-11-28 NOTE — Patient Instructions (Signed)
Follow up for any fever, adenopathy in neck, or other concerns.

## 2015-12-03 DIAGNOSIS — M50922 Unspecified cervical disc disorder at C5-C6 level: Secondary | ICD-10-CM | POA: Diagnosis not present

## 2015-12-03 DIAGNOSIS — M50923 Unspecified cervical disc disorder at C6-C7 level: Secondary | ICD-10-CM | POA: Diagnosis not present

## 2015-12-03 DIAGNOSIS — M25511 Pain in right shoulder: Secondary | ICD-10-CM | POA: Diagnosis not present

## 2015-12-03 DIAGNOSIS — M5136 Other intervertebral disc degeneration, lumbar region: Secondary | ICD-10-CM | POA: Diagnosis not present

## 2015-12-05 DIAGNOSIS — F411 Generalized anxiety disorder: Secondary | ICD-10-CM | POA: Diagnosis not present

## 2015-12-06 DIAGNOSIS — M65331 Trigger finger, right middle finger: Secondary | ICD-10-CM | POA: Diagnosis not present

## 2015-12-06 DIAGNOSIS — M79645 Pain in left finger(s): Secondary | ICD-10-CM | POA: Diagnosis not present

## 2015-12-06 DIAGNOSIS — M65312 Trigger thumb, left thumb: Secondary | ICD-10-CM | POA: Diagnosis not present

## 2015-12-07 DIAGNOSIS — L570 Actinic keratosis: Secondary | ICD-10-CM | POA: Diagnosis not present

## 2015-12-10 ENCOUNTER — Other Ambulatory Visit: Payer: Self-pay | Admitting: Family Medicine

## 2015-12-10 DIAGNOSIS — M5136 Other intervertebral disc degeneration, lumbar region: Secondary | ICD-10-CM | POA: Diagnosis not present

## 2015-12-10 DIAGNOSIS — M50922 Unspecified cervical disc disorder at C5-C6 level: Secondary | ICD-10-CM | POA: Diagnosis not present

## 2015-12-10 DIAGNOSIS — M50923 Unspecified cervical disc disorder at C6-C7 level: Secondary | ICD-10-CM | POA: Diagnosis not present

## 2015-12-10 DIAGNOSIS — M25511 Pain in right shoulder: Secondary | ICD-10-CM | POA: Diagnosis not present

## 2015-12-11 ENCOUNTER — Ambulatory Visit (INDEPENDENT_AMBULATORY_CARE_PROVIDER_SITE_OTHER): Payer: BLUE CROSS/BLUE SHIELD | Admitting: Family Medicine

## 2015-12-11 VITALS — BP 110/70 | HR 72 | Temp 97.5°F | Ht 74.0 in | Wt 205.0 lb

## 2015-12-11 DIAGNOSIS — F411 Generalized anxiety disorder: Secondary | ICD-10-CM | POA: Diagnosis not present

## 2015-12-11 DIAGNOSIS — J329 Chronic sinusitis, unspecified: Secondary | ICD-10-CM | POA: Diagnosis not present

## 2015-12-11 MED ORDER — METHYLPREDNISOLONE ACETATE 80 MG/ML IJ SUSP
80.0000 mg | Freq: Once | INTRAMUSCULAR | Status: AC
  Administered 2015-12-11: 80 mg via INTRAMUSCULAR

## 2015-12-11 MED ORDER — AMOXICILLIN-POT CLAVULANATE 875-125 MG PO TABS: 1.0000 | ORAL_TABLET | Freq: Two times a day (BID) | ORAL | 0 refills | Status: DC

## 2015-12-11 NOTE — Progress Notes (Signed)
Subjective:     Patient ID: Peter Snyder, male   DOB: 1955/09/14, 60 y.o.   MRN: [id number]  HPI Patient seen with 2 week history of maxillary facial pain, headaches, increased malaise, upper teeth pain, and increased nasal congestion. Denies any cough. No sore throat. No hoarseness. No relief with over-the-counter medications. He's had frequent sinusitis in the past. Patient states he's had some congestion preceding this which he thinks may be allergic related. In the past, he has done well with combination of antibiotics and Depo-Medrol requesting the same. He states he's had side effects with oral prednisone.  Past Medical History:  Diagnosis Date  . Arthritis   . CAD (coronary artery disease) 6/14   Cardiac catheterization 6/27/ 2014 ejection fraction 35-40%, 30% proximal left circumflex, 100% tiny obtuse marginal 1 with collaterals, 50% LAD, 50% D1, 100% RCA with collaterals  . Chicken pox   . Colon polyps   . Depression   . Diabetes mellitus ([place])    Diet controlled  . GERD (gastroesophageal reflux disease)   . Hyperlipidemia   . Hypertension   . Kidney stone    Past Surgical History:  Procedure Laterality Date  . TONSILLECTOMY  1963    reports that he has never smoked. He does not have any smokeless tobacco history on file. He reports that he does not drink alcohol or use drugs. family history includes Arthritis in his father and paternal grandmother; Heart disease in his maternal grandmother; Hyperlipidemia in his maternal grandmother. Allergies  Allergen Reactions  . Sulfa Antibiotics Rash     Review of Systems  Constitutional: Positive for fatigue. Negative for chills and fever.  HENT: Positive for congestion and sinus pressure.   Respiratory: Negative for cough.   Neurological: Positive for headaches.       Objective:   Physical Exam  Constitutional: He appears well-developed and well-nourished.  HENT:  Right Ear: External ear normal.  Left Ear: External ear  normal.  Mouth/Throat: Oropharynx is clear and moist.  Neck: Neck supple.  Cardiovascular: Normal rate and regular rhythm.   Pulmonary/Chest: Effort normal and breath sounds normal. No respiratory distress. He has no wheezes. He has no rales.       Assessment:     Acute maxillary sinusitis    Plan:     -Given duration of symptoms start Augmentin 875 mg twice daily for 10 days. -Follow-up promptly for any fever or other concerns -We explained we do not typically use steroids with antibiotics for sinusitis but he has had significant nasal congestion.  Depo-Medrol 80 mg IM given  [name] MD [facility] at [facility]

## 2015-12-11 NOTE — Progress Notes (Signed)
Pre visit review using our clinic review tool, if applicable. No additional management support is needed unless otherwise documented below in the visit note. 

## 2015-12-11 NOTE — Patient Instructions (Signed)

## 2015-12-17 DIAGNOSIS — M5136 Other intervertebral disc degeneration, lumbar region: Secondary | ICD-10-CM | POA: Diagnosis not present

## 2015-12-17 DIAGNOSIS — M50922 Unspecified cervical disc disorder at C5-C6 level: Secondary | ICD-10-CM | POA: Diagnosis not present

## 2015-12-17 DIAGNOSIS — M50923 Unspecified cervical disc disorder at C6-C7 level: Secondary | ICD-10-CM | POA: Diagnosis not present

## 2015-12-17 DIAGNOSIS — M25511 Pain in right shoulder: Secondary | ICD-10-CM | POA: Diagnosis not present

## 2015-12-18 DIAGNOSIS — M12271 Villonodular synovitis (pigmented), right ankle and foot: Secondary | ICD-10-CM | POA: Diagnosis not present

## 2015-12-18 DIAGNOSIS — M722 Plantar fascial fibromatosis: Secondary | ICD-10-CM | POA: Diagnosis not present

## 2015-12-18 DIAGNOSIS — M792 Neuralgia and neuritis, unspecified: Secondary | ICD-10-CM | POA: Diagnosis not present

## 2015-12-18 DIAGNOSIS — Z85828 Personal history of other malignant neoplasm of skin: Secondary | ICD-10-CM | POA: Diagnosis not present

## 2015-12-19 DIAGNOSIS — F411 Generalized anxiety disorder: Secondary | ICD-10-CM | POA: Diagnosis not present

## 2015-12-20 DIAGNOSIS — M545 Low back pain: Secondary | ICD-10-CM | POA: Diagnosis not present

## 2015-12-21 ENCOUNTER — Other Ambulatory Visit: Payer: Self-pay | Admitting: Family Medicine

## 2015-12-24 DIAGNOSIS — M5136 Other intervertebral disc degeneration, lumbar region: Secondary | ICD-10-CM | POA: Diagnosis not present

## 2015-12-24 DIAGNOSIS — M50923 Unspecified cervical disc disorder at C6-C7 level: Secondary | ICD-10-CM | POA: Diagnosis not present

## 2015-12-24 DIAGNOSIS — M25511 Pain in right shoulder: Secondary | ICD-10-CM | POA: Diagnosis not present

## 2015-12-24 DIAGNOSIS — M50922 Unspecified cervical disc disorder at C5-C6 level: Secondary | ICD-10-CM | POA: Diagnosis not present

## 2015-12-25 DIAGNOSIS — F411 Generalized anxiety disorder: Secondary | ICD-10-CM | POA: Diagnosis not present

## 2015-12-26 ENCOUNTER — Other Ambulatory Visit: Payer: Self-pay | Admitting: Family Medicine

## 2015-12-27 DIAGNOSIS — L299 Pruritus, unspecified: Secondary | ICD-10-CM | POA: Diagnosis not present

## 2015-12-28 ENCOUNTER — Ambulatory Visit (INDEPENDENT_AMBULATORY_CARE_PROVIDER_SITE_OTHER): Payer: BLUE CROSS/BLUE SHIELD | Admitting: Family Medicine

## 2015-12-28 DIAGNOSIS — L299 Pruritus, unspecified: Secondary | ICD-10-CM

## 2015-12-28 NOTE — Progress Notes (Signed)
Subjective:     Patient ID: Peter Snyder, male   DOB: 1955-06-29, 60 y.o.   MRN: [id number]  HPI Patient seen with pruritus involving both thighs over the past week or so. He has not seen any rash. He has pruritus anterior thigh. No clear exacerbating factors. He's tried some leftover triamcinolone cream without much improvement. Denies any generalized pruritus. 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  Past Medical History:  Diagnosis Date  . Arthritis   . CAD (coronary artery disease) 6/14   Cardiac catheterization 6/27/ 2014 ejection fraction 35-40%, 30% proximal left circumflex, 100% tiny obtuse marginal 1 with collaterals, 50% LAD, 50% D1, 100% RCA with collaterals  . Chicken pox   . Colon polyps   . Depression   . Diabetes mellitus ([place])    Diet controlled  . GERD (gastroesophageal reflux disease)   . Hyperlipidemia   . Hypertension   . Kidney stone    Past Surgical History:  Procedure Laterality Date  . TONSILLECTOMY  1963    reports that he has never smoked. He does not have any smokeless tobacco history on file. He reports that he does not drink alcohol or use drugs. family history includes Arthritis in his father and paternal grandmother; Heart disease in his maternal grandmother; Hyperlipidemia in his maternal grandmother. Allergies  Allergen Reactions  . Sulfa Antibiotics Rash     Review of Systems  Constitutional: Negative for chills and fever.  Skin: Negative for rash.       Objective:   Physical Exam  Constitutional: He appears well-developed and well-nourished.  Cardiovascular: Normal rate and regular rhythm.   Pulmonary/Chest: Effort normal and breath sounds normal. No respiratory distress. He has no wheezes. He has no rales.  Skin: No rash noted.  He has somewhat dry scan legs but anterior thighs  reveal no dryness. No papules. No pustules. No scaling       Assessment:     Pruritus involving anterior thighs bilaterally. No evidence for rash    Plan:     -Continue triamcinolone 0.1% cream twice daily and combined 50-50 with moisturizer such as Cetaphil. -Avoid scratching as much as possible. -Consider over-the-counter plain Zyrtec 1 daily  [name] MD [facility] at [facility]

## 2015-12-28 NOTE — Progress Notes (Signed)
Pre visit review using our clinic review tool, if applicable. No additional management support is needed unless otherwise documented below in the visit note. 

## 2015-12-28 NOTE — Patient Instructions (Signed)
Continue with the Triamcinolone cream Consider OTC Zyrtec once daily.

## 2015-12-31 DIAGNOSIS — F411 Generalized anxiety disorder: Secondary | ICD-10-CM | POA: Diagnosis not present

## 2016-01-01 DIAGNOSIS — M5136 Other intervertebral disc degeneration, lumbar region: Secondary | ICD-10-CM | POA: Diagnosis not present

## 2016-01-01 DIAGNOSIS — M25511 Pain in right shoulder: Secondary | ICD-10-CM | POA: Diagnosis not present

## 2016-01-01 DIAGNOSIS — M50923 Unspecified cervical disc disorder at C6-C7 level: Secondary | ICD-10-CM | POA: Diagnosis not present

## 2016-01-01 DIAGNOSIS — M50922 Unspecified cervical disc disorder at C5-C6 level: Secondary | ICD-10-CM | POA: Diagnosis not present

## 2016-01-02 DIAGNOSIS — F411 Generalized anxiety disorder: Secondary | ICD-10-CM | POA: Diagnosis not present

## 2016-01-03 DIAGNOSIS — C609 Malignant neoplasm of penis, unspecified: Secondary | ICD-10-CM | POA: Diagnosis not present

## 2016-01-03 DIAGNOSIS — R202 Paresthesia of skin: Secondary | ICD-10-CM | POA: Diagnosis not present

## 2016-01-03 DIAGNOSIS — L309 Dermatitis, unspecified: Secondary | ICD-10-CM | POA: Diagnosis not present

## 2016-01-03 DIAGNOSIS — L820 Inflamed seborrheic keratosis: Secondary | ICD-10-CM | POA: Diagnosis not present

## 2016-01-03 DIAGNOSIS — L570 Actinic keratosis: Secondary | ICD-10-CM | POA: Diagnosis not present

## 2016-01-07 DIAGNOSIS — M79644 Pain in right finger(s): Secondary | ICD-10-CM | POA: Diagnosis not present

## 2016-01-07 DIAGNOSIS — M65311 Trigger thumb, right thumb: Secondary | ICD-10-CM | POA: Diagnosis not present

## 2016-01-07 DIAGNOSIS — M79645 Pain in left finger(s): Secondary | ICD-10-CM | POA: Diagnosis not present

## 2016-01-07 DIAGNOSIS — M65332 Trigger finger, left middle finger: Secondary | ICD-10-CM | POA: Diagnosis not present

## 2016-01-08 DIAGNOSIS — M5136 Other intervertebral disc degeneration, lumbar region: Secondary | ICD-10-CM | POA: Diagnosis not present

## 2016-01-08 DIAGNOSIS — M50922 Unspecified cervical disc disorder at C5-C6 level: Secondary | ICD-10-CM | POA: Diagnosis not present

## 2016-01-08 DIAGNOSIS — M50923 Unspecified cervical disc disorder at C6-C7 level: Secondary | ICD-10-CM | POA: Diagnosis not present

## 2016-01-08 DIAGNOSIS — M25511 Pain in right shoulder: Secondary | ICD-10-CM | POA: Diagnosis not present

## 2016-01-11 ENCOUNTER — Ambulatory Visit (INDEPENDENT_AMBULATORY_CARE_PROVIDER_SITE_OTHER): Payer: BLUE CROSS/BLUE SHIELD | Admitting: Family Medicine

## 2016-01-11 ENCOUNTER — Encounter: Payer: Self-pay | Admitting: Family Medicine

## 2016-01-11 ENCOUNTER — Encounter: Payer: Self-pay | Admitting: Cardiology

## 2016-01-11 VITALS — BP 100/50 | HR 74 | Temp 97.6°F | Ht 74.0 in | Wt 208.1 lb

## 2016-01-11 DIAGNOSIS — I2510 Atherosclerotic heart disease of native coronary artery without angina pectoris: Secondary | ICD-10-CM | POA: Diagnosis not present

## 2016-01-11 DIAGNOSIS — R002 Palpitations: Secondary | ICD-10-CM | POA: Diagnosis not present

## 2016-01-11 DIAGNOSIS — I2583 Coronary atherosclerosis due to lipid rich plaque: Secondary | ICD-10-CM

## 2016-01-11 DIAGNOSIS — F439 Reaction to severe stress, unspecified: Secondary | ICD-10-CM | POA: Diagnosis not present

## 2016-01-11 NOTE — Patient Instructions (Addendum)
Decrease caffeine.  Manage srress - consider counseling, medication, yoga, etc and follow up with your primary doctor for any worsening anxiety.  Follow up promptly with your cardiologist for any persistent or worsening symptoms or cardiac concerns.

## 2016-01-11 NOTE — Progress Notes (Signed)
Pre visit review using our clinic review tool, if applicable. No additional management support is needed unless otherwise documented below in the visit note. 

## 2016-01-11 NOTE — Progress Notes (Signed)
HPI:  Acute visit for:  Palpitations: -I have never seen him before, sees a cardiologist and PCP out of office -reports long hx palpitations (reports on coreg for this), PVCs, RBBB, hx CAD monitored by cardiologist with extensive eval earlier this year when he had acid reflux -reports increased stress and sig caffeine recently (3-4 cups caffienated beverages daily) -for the last few days noticed brief palpitation 1-2 times daily -reports he did not contact his cardiologist as they will usually tell him it is normal if he tells them this -walks 3-4 miles per day and reports has no symptoms with activity -denies: CP, SOB, DOE, weakness, dizziness, LOC, long lasting or frequent palpitations, swelling   ROS: See pertinent positives and negatives per HPI.  Past Medical History:  Diagnosis Date  . Arthritis   . CAD (coronary artery disease) 6/14   Cardiac catheterization 6/27/ 2014 ejection fraction 35-40%, 30% proximal left circumflex, 100% tiny obtuse marginal 1 with collaterals, 50% LAD, 50% D1, 100% RCA with collaterals  . Chicken pox   . Colon polyps   . Depression   . Diabetes mellitus ([place])    Diet controlled  . GERD (gastroesophageal reflux disease)   . Hyperlipidemia   . Hypertension   . Kidney stone     Past Surgical History:  Procedure Laterality Date  . TONSILLECTOMY  1963    Family History  Problem Relation Age of Onset  . Arthritis Father   . Hyperlipidemia Maternal Grandmother   . Heart disease Maternal Grandmother   . Arthritis Paternal Grandmother     Social History   Social History  . Marital status: Married    Spouse name: N/A  . Number of children: 3  . Years of education: N/A   Occupational History  .      Retired   Social History Main Topics  . Smoking status: Never Smoker  . Smokeless tobacco: None  . Alcohol use No  . Drug use: No  . Sexual activity: Not Asked   Other Topics Concern  . None   Social History Narrative  . None      Current Outpatient Prescriptions:  .  acetaminophen (TYLENOL) 500 MG tablet, Take 500 mg by mouth every 6 (six) hours as needed for moderate pain., Disp: , Rfl:  .  ALPRAZolam (XANAX) 0.25 MG tablet, Take 0.25 mg by mouth daily. , Disp: , Rfl:  .  aspirin 81 MG tablet, Take 81 mg by mouth daily., Disp: , Rfl:  .  atorvastatin (LIPITOR) 80 MG tablet, TAKE 1 TABLET BY MOUTH EVERY DAY, Disp: 90 tablet, Rfl: 3 .  AZASITE 1 % ophthalmic solution, INSTILL 1 DROP INTO AFFECTED EYE AT BEDTIME, Disp: , Rfl: 4 .  Bismuth Tribromoph-Petrolatum (XEROFORM PETROLAT PATCH 4"X4") PADS, USE AS DIRECTED TWICE A DAY EXTERNALLY 30 DAYS, Disp: , Rfl: 4 .  carvedilol (COREG) 12.5 MG tablet, Take 1 tablet (12.5 mg total) by mouth 2 (two) times daily with a meal., Disp: 180 tablet, Rfl: 3 .  celecoxib (CELEBREX) 200 MG capsule, Take 200 mg by mouth daily. , Disp: , Rfl:  .  clonazePAM (KLONOPIN) 0.5 MG tablet, Take 0.5 mg by mouth at bedtime. , Disp: , Rfl:  .  ELIDEL 1 % cream, APPLY TO AFFECTED AREA TWICE A DAY AS NEEDED FOR 14 DAYS, Disp: , Rfl: 2 .  escitalopram (LEXAPRO) 20 MG tablet, Take 20 mg by mouth daily., Disp: , Rfl:  .  ezetimibe (ZETIA) 10 MG tablet, Take 1  tablet (10 mg total) by mouth daily., Disp: 30 tablet, Rfl: 3 .  fluticasone (FLONASE) 50 MCG/ACT nasal spray, Place 2 sprays into both nostrils daily., Disp: 16 g, Rfl: 6 .  gabapentin (NEURONTIN) 600 MG tablet, TAKE 1 TABLET (600 MG TOTAL) BY MOUTH 3 (THREE) TIMES DAILY., Disp: 270 tablet, Rfl: 3 .  pantoprazole (PROTONIX) 40 MG tablet, Take 1 tablet (40 mg total) by mouth 2 (two) times daily., Disp: 60 tablet, Rfl: 11 .  pantoprazole (PROTONIX) 40 MG tablet, TAKE 1 TABLET (40 MG TOTAL) BY MOUTH DAILY., Disp: 90 tablet, Rfl: 0 .  RESTASIS 0.05 % ophthalmic emulsion, Place 1 drop into both eyes 2 (two) times daily. , Disp: , Rfl:  .  tamsulosin (FLOMAX) 0.4 MG CAPS capsule, TAKE 1 CAPSULE BY MOUTH 1/2 HOUR FOLLOWING THE SAME MEAL EACH DAY, Disp:  30 capsule, Rfl: 2 .  VOLTAREN 1 % GEL, Apply 2 g topically daily. , Disp: , Rfl:  .  zolpidem (AMBIEN) 10 MG tablet, Take 10 mg by mouth at bedtime as needed for sleep., Disp: , Rfl:   EXAM:  Vitals:   01/11/16 1116  BP: (!) 100/50  Pulse: 74  Temp: 97.6 F (36.4 C)    Body mass index is 26.72 kg/m.  GENERAL: vitals reviewed and listed above, alert, oriented, appears well hydrated and in no acute distress  HEENT: atraumatic, conjunttiva clear, no obvious abnormalities on inspection of external nose and ears  NECK: no obvious masses on inspection  LUNGS: clear to auscultation bilaterally, no wheezes, rales or rhonchi, good air movement  CV: HRRR, no peripheral edema  MS: moves all extremities without noticeable abnormality  PSYCH: pleasant and cooperative, no obvious depression or anxiety  ASSESSMENT AND PLAN:  Discussed the following assessment and plan:  Palpitations - Plan: EKG 12-Lead  Stress  Coronary artery disease due to lipid rich plaque  -EKG with NSR, incomplete BBB, NSTW abnormalities seen prior  -no symptoms currently  -echo and myocardial perfusion results from earlier this year reviewed -he is very anxious and has an odd cardiac hx on brief review of chart, advised that he notify his cardiologist given his hx  - I will also forward EKG from today and and message to his cardiologist -advised decreased caffeine and stress reduction -advised palpitations are common, stress and caffeine can worsen -Patient advised to return or notify a doctor immediately if symptoms worsen or persist or new concerns arise.  Patient Instructions  Decrease caffeine.  Manage srress - consider counseling, medication, yoga, etc and follow up with your primary doctor for any worsening anxiety.  Follow up promptly with your cardiologist for any persistent or worsening symptoms or cardiac concerns.   Colin Benton R., DO

## 2016-01-15 DIAGNOSIS — M50923 Unspecified cervical disc disorder at C6-C7 level: Secondary | ICD-10-CM | POA: Diagnosis not present

## 2016-01-15 DIAGNOSIS — M50922 Unspecified cervical disc disorder at C5-C6 level: Secondary | ICD-10-CM | POA: Diagnosis not present

## 2016-01-15 DIAGNOSIS — M79671 Pain in right foot: Secondary | ICD-10-CM | POA: Diagnosis not present

## 2016-01-15 DIAGNOSIS — M25511 Pain in right shoulder: Secondary | ICD-10-CM | POA: Diagnosis not present

## 2016-01-15 DIAGNOSIS — M5136 Other intervertebral disc degeneration, lumbar region: Secondary | ICD-10-CM | POA: Diagnosis not present

## 2016-01-15 DIAGNOSIS — L600 Ingrowing nail: Secondary | ICD-10-CM | POA: Diagnosis not present

## 2016-01-16 ENCOUNTER — Ambulatory Visit (INDEPENDENT_AMBULATORY_CARE_PROVIDER_SITE_OTHER): Payer: BLUE CROSS/BLUE SHIELD | Admitting: Family Medicine

## 2016-01-16 ENCOUNTER — Other Ambulatory Visit: Payer: Self-pay

## 2016-01-16 ENCOUNTER — Encounter: Payer: Self-pay | Admitting: Family Medicine

## 2016-01-16 VITALS — BP 130/80 | HR 78 | Temp 98.1°F | Ht 74.0 in | Wt 205.0 lb

## 2016-01-16 DIAGNOSIS — R208 Other disturbances of skin sensation: Secondary | ICD-10-CM

## 2016-01-16 DIAGNOSIS — F411 Generalized anxiety disorder: Secondary | ICD-10-CM | POA: Diagnosis not present

## 2016-01-16 MED ORDER — EZETIMIBE 10 MG PO TABS: 10.0000 mg | ORAL_TABLET | Freq: Every day | ORAL | 1 refills | Status: DC

## 2016-01-16 NOTE — Progress Notes (Signed)
Pre visit review using our clinic review tool, if applicable. No additional management support is needed unless otherwise documented below in the visit note. 

## 2016-01-16 NOTE — Progress Notes (Signed)
Subjective:     Patient ID: Peter Snyder, male   DOB: February 02, 1956, 60 y.o.   MRN: [id number]  HPI Patient presents with bilateral thigh pruritis and occasional burning of the left thigh without any associated rash. He recently went to his dermatologist who suggested possible neuropathic etiology to symptoms. He has had some intermittent back pain. He denies any motor weakness whatsoever. He continues to walk about 3-4 miles per day without difficulty. He takes gabapentin 600 mg twice a day and dermatologist suggested increasing to 3 times a day. However, he had increased sedation and has dropped back now to twice a day. Has never seen associated rash. No loss of urine or stool control. Symptoms are relatively mild and seem to be exacerbated by walking with loose clothing  Past Medical History:  Diagnosis Date  . Arthritis   . CAD (coronary artery disease) 6/14   Cardiac catheterization 6/27/ 2014 ejection fraction 35-40%, 30% proximal left circumflex, 100% tiny obtuse marginal 1 with collaterals, 50% LAD, 50% D1, 100% RCA with collaterals  . Chicken pox   . Colon polyps   . Depression   . Diabetes mellitus ([place])    Diet controlled  . GERD (gastroesophageal reflux disease)   . Hyperlipidemia   . Hypertension   . Kidney stone    Past Surgical History:  Procedure Laterality Date  . TONSILLECTOMY  1963    reports that he has never smoked. He does not have any smokeless tobacco history on file. He reports that he does not drink alcohol or use drugs. family history includes Arthritis in his father and paternal grandmother; Heart disease in his maternal grandmother; Hyperlipidemia in his maternal grandmother. Allergies  Allergen Reactions  . Sulfa Antibiotics Rash     Review of Systems  Constitutional: Negative for chills and fever.  Skin: Negative for rash.       Objective:   Physical Exam  Constitutional: He appears well-developed and well-nourished.  Musculoskeletal:  Straight  leg raise are negative. Thighs reveal no visible erythema or swelling. Nontender  Neurological:  Full-strength lower extremities. Symmetric reflexes.  Skin: No rash noted.       Assessment:     Sensory dysesthesias involving both thighs. Question neuropathic. No evidence for rash    Plan:     -Continue gabapentin 600 mg twice a day -Suggested he consider Vaseline over friction areas before walking and also consider biking shorts possibly to wear underneath his warmup pants -Follow-up for any motor weakness or other changes symptoms  [name] MD [facility] at [facility]

## 2016-01-16 NOTE — Patient Instructions (Signed)
Follow up for any lower extremity weakness or progressive numbness.

## 2016-01-20 DIAGNOSIS — M545 Low back pain: Secondary | ICD-10-CM | POA: Diagnosis not present

## 2016-01-23 DIAGNOSIS — M50923 Unspecified cervical disc disorder at C6-C7 level: Secondary | ICD-10-CM | POA: Diagnosis not present

## 2016-01-23 DIAGNOSIS — M5136 Other intervertebral disc degeneration, lumbar region: Secondary | ICD-10-CM | POA: Diagnosis not present

## 2016-01-23 DIAGNOSIS — M50922 Unspecified cervical disc disorder at C5-C6 level: Secondary | ICD-10-CM | POA: Diagnosis not present

## 2016-01-23 DIAGNOSIS — M25511 Pain in right shoulder: Secondary | ICD-10-CM | POA: Diagnosis not present

## 2016-01-24 DIAGNOSIS — F411 Generalized anxiety disorder: Secondary | ICD-10-CM | POA: Diagnosis not present

## 2016-01-29 DIAGNOSIS — M50923 Unspecified cervical disc disorder at C6-C7 level: Secondary | ICD-10-CM | POA: Diagnosis not present

## 2016-01-29 DIAGNOSIS — M25511 Pain in right shoulder: Secondary | ICD-10-CM | POA: Diagnosis not present

## 2016-01-29 DIAGNOSIS — M5136 Other intervertebral disc degeneration, lumbar region: Secondary | ICD-10-CM | POA: Diagnosis not present

## 2016-01-29 DIAGNOSIS — M50922 Unspecified cervical disc disorder at C5-C6 level: Secondary | ICD-10-CM | POA: Diagnosis not present

## 2016-01-30 ENCOUNTER — Encounter: Payer: Self-pay | Admitting: Family Medicine

## 2016-01-30 ENCOUNTER — Ambulatory Visit (INDEPENDENT_AMBULATORY_CARE_PROVIDER_SITE_OTHER): Payer: BLUE CROSS/BLUE SHIELD | Admitting: Family Medicine

## 2016-01-30 VITALS — BP 110/70 | HR 72 | Temp 97.9°F | Ht 74.0 in | Wt 209.0 lb

## 2016-01-30 DIAGNOSIS — L089 Local infection of the skin and subcutaneous tissue, unspecified: Secondary | ICD-10-CM

## 2016-01-30 DIAGNOSIS — Z0000 Encounter for general adult medical examination without abnormal findings: Secondary | ICD-10-CM

## 2016-01-30 MED ORDER — DOXYCYCLINE HYCLATE 100 MG PO CAPS: 100.0000 mg | ORAL_CAPSULE | Freq: Two times a day (BID) | ORAL | 0 refills | Status: DC

## 2016-01-30 NOTE — Patient Instructions (Signed)
Use warm compresses or warm baths next couple of days Clean with soap daily Start Doxycycline for any progressive redness or swelling.

## 2016-01-30 NOTE — Progress Notes (Signed)
HPI: FU coronary artery disease. Previous cardiac care in [place]. Cardiac catheterization performed in June of 2014 showed an ejection fraction of 35-40%. The inferior wall is akinetic. The left main was normal. There was a 30% proximal circumflex, occluded small first obtuse marginal with collaterals, a 50% ramus intermedius, 50% LAD, 50% first diagonal and an occluded right coronary artery with collaterals. Treated medically. Nuclear study January 2017 showed ejection fraction 35%. There was a prior inferior infarct with mild peri-infarct ischemia. Echocardiogram In January 2017 interpreted as normal LV systolic function, trace aortic insufficiency mild mitral regurgitation and mild left atrial enlargement. I previously reviewed the echocardiogram and there appearsed to be akinesis of the inferior and basal inferior lateral wall. Ejection fraction in the 35-40% range. Cardiac MRI May 2017 showed akinetic inferior wall with ejection fraction 40%. Biatrial enlargement and mild aortic/mitral regurgitation. Aortic root 3.9 cm. Since last seen, the patient denies any dyspnea on exertion, orthopnea, PND, pedal edema, syncope or chest pain. He has had occasional palpitations described as a skip.    Current Outpatient Prescriptions  Medication Sig Dispense Refill  . acetaminophen (TYLENOL) 500 MG tablet Take 500 mg by mouth every 6 (six) hours as needed for moderate pain.    Marland Kitchen ALPRAZolam (XANAX) 0.25 MG tablet Take 0.25 mg by mouth daily.     Marland Kitchen aspirin 81 MG tablet Take 81 mg by mouth daily.    Marland Kitchen atorvastatin (LIPITOR) 80 MG tablet TAKE 1 TABLET BY MOUTH EVERY DAY 90 tablet 3  . AZASITE 1 % ophthalmic solution INSTILL 1 DROP INTO AFFECTED EYE AT BEDTIME  4  . Bismuth Tribromoph-Petrolatum (XEROFORM PETROLAT PATCH 4"X4") PADS USE AS DIRECTED TWICE A DAY EXTERNALLY 30 DAYS  4  . carvedilol (COREG) 12.5 MG tablet Take 1 tablet (12.5 mg total) by mouth 2 (two) times daily with a meal. 180 tablet 3  .  celecoxib (CELEBREX) 200 MG capsule Take 200 mg by mouth daily.     . clonazePAM (KLONOPIN) 0.5 MG tablet Take 0.5 mg by mouth at bedtime.     Marland Kitchen ELIDEL 1 % cream APPLY TO AFFECTED AREA TWICE A DAY AS NEEDED FOR 14 DAYS  2  . escitalopram (LEXAPRO) 20 MG tablet Take 20 mg by mouth daily.    Marland Kitchen ezetimibe (ZETIA) 10 MG tablet Take 1 tablet (10 mg total) by mouth daily. 90 tablet 1  . fluticasone (FLONASE) 50 MCG/ACT nasal spray Place 2 sprays into both nostrils daily. 16 g 6  . gabapentin (NEURONTIN) 600 MG tablet TAKE 1 TABLET (600 MG TOTAL) BY MOUTH 3 (THREE) TIMES DAILY. 270 tablet 3  . pantoprazole (PROTONIX) 40 MG tablet TAKE 1 TABLET (40 MG TOTAL) BY MOUTH DAILY. 90 tablet 0  . RESTASIS 0.05 % ophthalmic emulsion Place 1 drop into both eyes 2 (two) times daily.     . tamsulosin (FLOMAX) 0.4 MG CAPS capsule TAKE 1 CAPSULE BY MOUTH 1/2 HOUR FOLLOWING THE SAME MEAL EACH DAY 30 capsule 2  . VOLTAREN 1 % GEL Apply 2 g topically daily.     Marland Kitchen zolpidem (AMBIEN) 10 MG tablet Take 10 mg by mouth at bedtime as needed for sleep.     No current facility-administered medications for this visit.      Past Medical History:  Diagnosis Date  . Arthritis   . CAD (coronary artery disease) 6/14   Cardiac catheterization 6/27/ 2014 ejection fraction 35-40%, 30% proximal left circumflex, 100% tiny obtuse marginal 1 with  collaterals, 50% LAD, 50% D1, 100% RCA with collaterals  . Chicken pox   . Colon polyps   . Depression   . Diabetes mellitus ([place])    Diet controlled  . GERD (gastroesophageal reflux disease)   . Hyperlipidemia   . Hypertension   . Kidney stone     Past Surgical History:  Procedure Laterality Date  . TONSILLECTOMY  1963    Social History   Social History  . Marital status: Married    Spouse name: N/A  . Number of children: 3  . Years of education: N/A   Occupational History  .      Retired   Social History Main Topics  . Smoking status: Never Smoker  . Smokeless  tobacco: Not on file  . Alcohol use No  . Drug use: No  . Sexual activity: Not on file   Other Topics Concern  . Not on file   Social History Narrative  . No narrative on file    Family History  Problem Relation Age of Onset  . Arthritis Father   . Hyperlipidemia Maternal Grandmother   . Heart disease Maternal Grandmother   . Arthritis Paternal Grandmother     ROS: no fevers or chills, productive cough, hemoptysis, dysphasia, odynophagia, melena, hematochezia, dysuria, hematuria, rash, seizure activity, orthopnea, PND, pedal edema, claudication. Remaining systems are negative.  Physical Exam: Well-developed well-nourished in no acute distress.  Skin is warm and dry.  HEENT is normal.  Neck is supple.  Chest is clear to auscultation with normal expansion.  Cardiovascular exam is regular rate and rhythm.  Abdominal exam nontender or distended. No masses palpated. Extremities show no edema. neuro grossly intact  ECG-01/11/2016-sinus rhythm, inferior infarct.  A/P  1 Coronary artery disease-continue aspirin and statin.  2 hyperlipidemia-continue statin and Zetia.  3 hypertension-blood pressure controlled. Continue present medications.  4 ischemic cardiomyopathy-continue ACE inhibitor and beta blocker.   5 Mildly dilated aortic root-plan MRA to reassess May 2018.  6 palpitations-sound to be PACs or PVCs. If these worsen we will consider a monitor.  Kirk Ruths, MD

## 2016-01-30 NOTE — Progress Notes (Signed)
Subjective:     Patient ID: Peter Snyder, male   DOB: 1955-07-09, 59 y.o.   MRN: [id number]  HPI Patient seen with possible "boil "right buttock. First noted couple days ago. Minimally painful. Apparently had yellow or white center and his wife tried to squeeze the center. She obtained minimal drainage. No fever or chills. No prior history of MRSA. Allergic to sulfa. Denies any other skin pustules or boils.  Generally feels well. Still walking about hour per day. No recent chest pains.  Past Medical History:  Diagnosis Date  . Arthritis   . CAD (coronary artery disease) 6/14   Cardiac catheterization 6/27/ 2014 ejection fraction 35-40%, 30% proximal left circumflex, 100% tiny obtuse marginal 1 with collaterals, 50% LAD, 50% D1, 100% RCA with collaterals  . Chicken pox   . Colon polyps   . Depression   . Diabetes mellitus ([place])    Diet controlled  . GERD (gastroesophageal reflux disease)   . Hyperlipidemia   . Hypertension   . Kidney stone    Past Surgical History:  Procedure Laterality Date  . TONSILLECTOMY  1963    reports that he has never smoked. He does not have any smokeless tobacco history on file. He reports that he does not drink alcohol or use drugs. family history includes Arthritis in his father and paternal grandmother; Heart disease in his maternal grandmother; Hyperlipidemia in his maternal grandmother. Allergies  Allergen Reactions  . Sulfa Antibiotics Rash     Review of Systems  Constitutional: Negative for chills and fever.       Objective:   Physical Exam  Constitutional: He appears well-developed and well-nourished.  Cardiovascular: Normal rate and regular rhythm.   Pulmonary/Chest: Effort normal and breath sounds normal. No respiratory distress. He has no wheezes. He has no rales.  Skin:  Right buttock approximately 1 center area of mild erythema. Slightly scabbed center. No fluctuance. No significant induration. Minimally tender.        Assessment:     Recent pustule right buttock. Appears to be resolving    Plan:     -Warm sitz baths and/or warm compresses couple times daily. -Observe for now. If he develops any progressive cellulitis changes- start doxycycline 100 mg twice a day for 10 days -Follow-up for any increasing induration, fluctuance or progressive erythema  [name] MD [facility]

## 2016-01-30 NOTE — Progress Notes (Signed)
Pre visit review using our clinic review tool, if applicable. No additional management support is needed unless otherwise documented below in the visit note. 

## 2016-02-04 DIAGNOSIS — M65332 Trigger finger, left middle finger: Secondary | ICD-10-CM | POA: Diagnosis not present

## 2016-02-05 ENCOUNTER — Encounter: Payer: Self-pay | Admitting: Cardiology

## 2016-02-05 ENCOUNTER — Ambulatory Visit (INDEPENDENT_AMBULATORY_CARE_PROVIDER_SITE_OTHER): Payer: BLUE CROSS/BLUE SHIELD | Admitting: Cardiology

## 2016-02-05 VITALS — BP 130/74 | HR 65 | Ht 74.0 in | Wt 209.2 lb

## 2016-02-05 DIAGNOSIS — M50923 Unspecified cervical disc disorder at C6-C7 level: Secondary | ICD-10-CM | POA: Diagnosis not present

## 2016-02-05 DIAGNOSIS — M25511 Pain in right shoulder: Secondary | ICD-10-CM | POA: Diagnosis not present

## 2016-02-05 DIAGNOSIS — I10 Essential (primary) hypertension: Secondary | ICD-10-CM

## 2016-02-05 DIAGNOSIS — E7800 Pure hypercholesterolemia, unspecified: Secondary | ICD-10-CM | POA: Diagnosis not present

## 2016-02-05 DIAGNOSIS — I255 Ischemic cardiomyopathy: Secondary | ICD-10-CM

## 2016-02-05 DIAGNOSIS — I2510 Atherosclerotic heart disease of native coronary artery without angina pectoris: Secondary | ICD-10-CM | POA: Diagnosis not present

## 2016-02-05 DIAGNOSIS — M50922 Unspecified cervical disc disorder at C5-C6 level: Secondary | ICD-10-CM | POA: Diagnosis not present

## 2016-02-05 DIAGNOSIS — M5136 Other intervertebral disc degeneration, lumbar region: Secondary | ICD-10-CM | POA: Diagnosis not present

## 2016-02-05 NOTE — Patient Instructions (Signed)
Your physician wants you to follow-up in: 6 MONTHS WITH DR CRENSHAW You will receive a reminder letter in the mail two months in advance. If you don't receive a letter, please call our office to schedule the follow-up appointment.   If you need a refill on your cardiac medications before your next appointment, please call your pharmacy.  

## 2016-02-06 ENCOUNTER — Encounter: Payer: Self-pay | Admitting: Adult Health

## 2016-02-06 ENCOUNTER — Ambulatory Visit (INDEPENDENT_AMBULATORY_CARE_PROVIDER_SITE_OTHER): Payer: BLUE CROSS/BLUE SHIELD | Admitting: Adult Health

## 2016-02-06 VITALS — BP 108/60 | Temp 97.9°F | Ht 74.0 in | Wt 208.9 lb

## 2016-02-06 DIAGNOSIS — J0110 Acute frontal sinusitis, unspecified: Secondary | ICD-10-CM | POA: Diagnosis not present

## 2016-02-06 DIAGNOSIS — F411 Generalized anxiety disorder: Secondary | ICD-10-CM | POA: Diagnosis not present

## 2016-02-06 MED ORDER — AMOXICILLIN-POT CLAVULANATE 875-125 MG PO TABS: 1.0000 | ORAL_TABLET | Freq: Two times a day (BID) | ORAL | 0 refills | Status: DC

## 2016-02-06 NOTE — Progress Notes (Signed)
Subjective:    Patient ID: Peter Snyder, male    DOB: [birth year], 60 y.o.   MRN: [id number]  Sinusitis  This is a new problem. The current episode started in the past 7 days (2-3 days). The problem is unchanged. There has been no fever. Associated symptoms include congestion, sinus pressure and a sore throat. Pertinent negatives include no chills, coughing, diaphoresis, ear pain or shortness of breath. Past treatments include spray decongestants (Flonase). The treatment provided no relief.    Review of Systems  Constitutional: Negative.  Negative for chills, diaphoresis and fever.  HENT: Positive for congestion, sinus pressure and sore throat. Negative for ear discharge, ear pain, postnasal drip, rhinorrhea and sinus pain.   Eyes: Negative.   Respiratory: Negative for cough and shortness of breath.   Cardiovascular: Negative.   Musculoskeletal: Negative.   Skin: Negative.   Neurological: Negative.   All other systems reviewed and are negative.  Past Medical History:  Diagnosis Date  . Arthritis   . CAD (coronary artery disease) 6/14   Cardiac catheterization 6/27/ 2014 ejection fraction 35-40%, 30% proximal left circumflex, 100% tiny obtuse marginal 1 with collaterals, 50% LAD, 50% D1, 100% RCA with collaterals  . Chicken pox   . Colon polyps   . Depression   . Diabetes mellitus ([place])    Diet controlled  . GERD (gastroesophageal reflux disease)   . Hyperlipidemia   . Hypertension   . Kidney stone     Social History   Social History  . Marital status: Married    Spouse name: N/A  . Number of children: 3  . Years of education: N/A   Occupational History  .      Retired   Social History Main Topics  . Smoking status: Never Smoker  . Smokeless tobacco: Not on file  . Alcohol use No  . Drug use: No  . Sexual activity: Not on file   Other Topics Concern  . Not on file   Social History Narrative  . No narrative on file    Past Surgical History:  Procedure  Laterality Date  . TONSILLECTOMY  1963    Family History  Problem Relation Age of Onset  . Arthritis Father   . Hyperlipidemia Maternal Grandmother   . Heart disease Maternal Grandmother   . Arthritis Paternal Grandmother     Allergies  Allergen Reactions  . Sulfa Antibiotics Rash    Current Outpatient Prescriptions on File Prior to Visit  Medication Sig Dispense Refill  . acetaminophen (TYLENOL) 500 MG tablet Take 500 mg by mouth every 6 (six) hours as needed for moderate pain.    Marland Kitchen ALPRAZolam (XANAX) 0.25 MG tablet Take 0.25 mg by mouth daily.     Marland Kitchen aspirin 81 MG tablet Take 81 mg by mouth daily.    Marland Kitchen atorvastatin (LIPITOR) 80 MG tablet TAKE 1 TABLET BY MOUTH EVERY DAY 90 tablet 3  . AZASITE 1 % ophthalmic solution INSTILL 1 DROP INTO AFFECTED EYE AT BEDTIME  4  . Bismuth Tribromoph-Petrolatum (XEROFORM PETROLAT PATCH 4"X4") PADS USE AS DIRECTED TWICE A DAY EXTERNALLY 30 DAYS  4  . carvedilol (COREG) 12.5 MG tablet Take 1 tablet (12.5 mg total) by mouth 2 (two) times daily with a meal. 180 tablet 3  . celecoxib (CELEBREX) 200 MG capsule Take 200 mg by mouth daily.     . clonazePAM (KLONOPIN) 0.5 MG tablet Take 0.5 mg by mouth at bedtime.     Marland Kitchen ELIDEL 1 %  cream APPLY TO AFFECTED AREA TWICE A DAY AS NEEDED FOR 14 DAYS  2  . escitalopram (LEXAPRO) 20 MG tablet Take 20 mg by mouth daily.    Marland Kitchen ezetimibe (ZETIA) 10 MG tablet Take 1 tablet (10 mg total) by mouth daily. 90 tablet 1  . fluticasone (FLONASE) 50 MCG/ACT nasal spray Place 2 sprays into both nostrils daily. 16 g 6  . gabapentin (NEURONTIN) 600 MG tablet TAKE 1 TABLET (600 MG TOTAL) BY MOUTH 3 (THREE) TIMES DAILY. 270 tablet 3  . lisinopril (PRINIVIL,ZESTRIL) 20 MG tablet Take 20 mg by mouth daily.    . pantoprazole (PROTONIX) 40 MG tablet TAKE 1 TABLET (40 MG TOTAL) BY MOUTH DAILY. 90 tablet 0  . RESTASIS 0.05 % ophthalmic emulsion Place 1 drop into both eyes 2 (two) times daily.     . tamsulosin (FLOMAX) 0.4 MG CAPS capsule  TAKE 1 CAPSULE BY MOUTH 1/2 HOUR FOLLOWING THE SAME MEAL EACH DAY 30 capsule 2  . VOLTAREN 1 % GEL Apply 2 g topically daily.     Marland Kitchen zolpidem (AMBIEN) 10 MG tablet Take 10 mg by mouth at bedtime as needed for sleep.     No current facility-administered medications on file prior to visit.     BP 108/60   Temp 97.9 F (36.6 C) (Oral)   Ht 6\' 2"  (1.88 m)   Wt 208 lb 14.4 oz (94.8 kg)   BMI 26.82 kg/m       Objective:   Physical Exam  Constitutional: He is oriented to person, place, and time. He appears well-developed and well-nourished. No distress.  HENT:  Head: Normocephalic and atraumatic.  Right Ear: Hearing, tympanic membrane, external ear and ear canal normal.  Left Ear: Hearing, tympanic membrane, external ear and ear canal normal.  Nose: Rhinorrhea present. No mucosal edema. Right sinus exhibits no maxillary sinus tenderness and no frontal sinus tenderness. Left sinus exhibits no maxillary sinus tenderness and no frontal sinus tenderness.  Mouth/Throat: Uvula is midline and mucous membranes are normal. Mucous membranes are not pale and not dry. Oropharyngeal exudate present. No posterior oropharyngeal edema, posterior oropharyngeal erythema or tonsillar abscesses.  Eyes: Conjunctivae and EOM are normal. Pupils are equal, round, and reactive to light. Right eye exhibits no discharge. Left eye exhibits no discharge. No scleral icterus.  Neck: Normal range of motion. Neck supple. No thyromegaly present.  Cardiovascular: Normal rate, regular rhythm, normal heart sounds and intact distal pulses.  Exam reveals no gallop and no friction rub.   No murmur heard. Pulmonary/Chest: Effort normal and breath sounds normal. No respiratory distress. He has no wheezes. He has no rales. He exhibits no tenderness.  Lymphadenopathy:    He has no cervical adenopathy.  Neurological: He is alert and oriented to person, place, and time. He has normal reflexes. He displays normal reflexes. No cranial  nerve deficit. He exhibits normal muscle tone. Coordination normal.  Skin: Skin is warm and dry. No rash noted. He is not diaphoretic. No erythema. No pallor.  Psychiatric: He has a normal mood and affect. His behavior is normal. Judgment and thought content normal.  Nursing note and vitals reviewed.     Assessment & Plan:  1. Acute non-recurrent frontal sinusitis - Likely Viral. Advised that since we are going into the holiday weekend that I would give him a paper prescription for Augmentin. If he is not feeling any better in the next 3-4 days then he can start this medication. For the time being he  can use Flonase - amoxicillin-clavulanate (AUGMENTIN) 875-125 MG tablet; Take 1 tablet by mouth 2 (two) times daily.  Dispense: 20 tablet; Refill: 0 - Follow up if no improvement  Dorothyann Peng, NP\

## 2016-02-12 DIAGNOSIS — M71571 Other bursitis, not elsewhere classified, right ankle and foot: Secondary | ICD-10-CM | POA: Diagnosis not present

## 2016-02-13 DIAGNOSIS — M5136 Other intervertebral disc degeneration, lumbar region: Secondary | ICD-10-CM | POA: Diagnosis not present

## 2016-02-13 DIAGNOSIS — M50922 Unspecified cervical disc disorder at C5-C6 level: Secondary | ICD-10-CM | POA: Diagnosis not present

## 2016-02-13 DIAGNOSIS — M50923 Unspecified cervical disc disorder at C6-C7 level: Secondary | ICD-10-CM | POA: Diagnosis not present

## 2016-02-13 DIAGNOSIS — M25511 Pain in right shoulder: Secondary | ICD-10-CM | POA: Diagnosis not present

## 2016-02-14 DIAGNOSIS — F411 Generalized anxiety disorder: Secondary | ICD-10-CM | POA: Diagnosis not present

## 2016-02-15 ENCOUNTER — Telehealth: Payer: Self-pay | Admitting: Family Medicine

## 2016-02-15 NOTE — Telephone Encounter (Signed)
Spoke with pt and he states that he has had a stomach bug since yesterday. He c/o nausea, vomiting and diarrhea. He noticed today several "scratches" on his hand. They do itch but are not painful. They are not blistered or crusted. He has had shingles before and notes that this "does not feel like that". Reviewed GI protocol for n/v/d. Pt voiced understanding. Advised pt of Saturday clinic or UC if symptoms worsen. Nothing further needed.

## 2016-02-15 NOTE — Telephone Encounter (Signed)
Does anyone have any openings?

## 2016-02-15 NOTE — Telephone Encounter (Signed)
Pt thinks he may have shingles on his hand.  There are bumps, itchy and tingling. Please advise if ok to schedule. Can you help

## 2016-02-18 ENCOUNTER — Ambulatory Visit (INDEPENDENT_AMBULATORY_CARE_PROVIDER_SITE_OTHER): Payer: BLUE CROSS/BLUE SHIELD | Admitting: Family Medicine

## 2016-02-18 ENCOUNTER — Encounter: Payer: Self-pay | Admitting: Family Medicine

## 2016-02-18 VITALS — BP 100/70 | HR 97 | Temp 98.1°F | Ht 74.0 in | Wt 206.0 lb

## 2016-02-18 DIAGNOSIS — R197 Diarrhea, unspecified: Secondary | ICD-10-CM

## 2016-02-18 DIAGNOSIS — I959 Hypotension, unspecified: Secondary | ICD-10-CM

## 2016-02-18 DIAGNOSIS — R531 Weakness: Secondary | ICD-10-CM | POA: Diagnosis not present

## 2016-02-18 NOTE — Progress Notes (Signed)
Subjective:     Patient ID: Peter Snyder, male   DOB: April 29, 1955, 60 y.o.   MRN: [id number]  HPI Patient seen with recent GI issues. He states this past Friday he 1 episode of vomiting and took some Zofran and has had none since then. He had some intermittent nausea but is keeping down fluids. He developed also some watery nonbloody diarrhea on Friday and states he had estimated 20 episodes. He started taking over-the-counter Imodium and by Saturday his diarrhea had resolved. He has not had any bowel movement since last weekend. Still has poor appetite but is keeping down some clear liquids. No sick contacts.  Slightly lightheaded when he stands. No syncope. He still taking his lisinopril and carvedilol. Denies any abdominal pain other than some infrequent mild cramping. Generally feels somewhat weak overall but no focal weakness. No chest pains.  He has not taken any further Imodium since Saturday.  Past Medical History:  Diagnosis Date  . Arthritis   . CAD (coronary artery disease) 6/14   Cardiac catheterization 6/27/ 2014 ejection fraction 35-40%, 30% proximal left circumflex, 100% tiny obtuse marginal 1 with collaterals, 50% LAD, 50% D1, 100% RCA with collaterals  . Chicken pox   . Colon polyps   . Depression   . Diabetes mellitus ([place])    Diet controlled  . GERD (gastroesophageal reflux disease)   . Hyperlipidemia   . Hypertension   . Kidney stone    Past Surgical History:  Procedure Laterality Date  . TONSILLECTOMY  1963    reports that he has never smoked. He does not have any smokeless tobacco history on file. He reports that he does not drink alcohol or use drugs. family history includes Arthritis in his father and paternal grandmother; Heart disease in his maternal grandmother; Hyperlipidemia in his maternal grandmother. Allergies  Allergen Reactions  . Sulfa Antibiotics Rash     Review of Systems  Constitutional: Negative for chills.  Respiratory: Negative for shortness  of breath.   Cardiovascular: Negative for chest pain.  Gastrointestinal: Positive for nausea. Negative for abdominal pain, blood in stool and vomiting.  Genitourinary: Negative for dysuria.  Neurological: Positive for weakness and light-headedness. Negative for syncope.       Objective:   Physical Exam  Constitutional: He appears well-developed and well-nourished.  HENT:  Mouth/Throat: Oropharynx is clear and moist.  Very small aphthous type ulcer right hard palate region  Neck: Neck supple.  Cardiovascular: Normal rate and regular rhythm.   Pulmonary/Chest: Effort normal and breath sounds normal. No respiratory distress. He has no wheezes. He has no rales.  Abdominal: Soft. Bowel sounds are normal. He exhibits no distension and no mass. There is no tenderness. There is no rebound and no guarding.       Assessment:     #1 recent vomiting and diarrhea. Suspect viral illness. Does not appear clinically dehydrated and symptoms are improving  #2 low blood pressure. No true orthostasis but standing blood pressure 90/58 and seated 94/60    Plan:     -Check basic metabolic panel -Hold lisinopril for systolic blood pressure less than 100 -Handout given on appropriate diet for recent diarrhea -Follow-up promptly for any fever, recurrent diarrhea, abdominal pain or any other new symptoms  [name] MD [facility] at [facility]

## 2016-02-18 NOTE — Patient Instructions (Signed)

## 2016-02-18 NOTE — Progress Notes (Signed)
Pre visit review using our clinic review tool, if applicable. No additional management support is needed unless otherwise documented below in the visit note. 

## 2016-02-19 ENCOUNTER — Encounter: Payer: Self-pay | Admitting: Family Medicine

## 2016-02-19 LAB — BASIC METABOLIC PANEL
BUN: 12 mg/dL (ref 6–23)
CHLORIDE: 102 meq/L (ref 96–112)
CO2: 29 mEq/L (ref 19–32)
Calcium: 8.8 mg/dL (ref 8.4–10.5)
Creatinine, Ser: 1.01 mg/dL (ref 0.40–1.50)
GFR: 79.88 mL/min (ref >60.00)
Glucose, Bld: 108 mg/dL — ABNORMAL HIGH (ref 70–99)
POTASSIUM: 4.3 meq/L (ref 3.5–5.1)
SODIUM: 138 meq/L (ref 135–145)

## 2016-02-21 DIAGNOSIS — M5136 Other intervertebral disc degeneration, lumbar region: Secondary | ICD-10-CM | POA: Diagnosis not present

## 2016-02-21 DIAGNOSIS — M25511 Pain in right shoulder: Secondary | ICD-10-CM | POA: Diagnosis not present

## 2016-02-21 DIAGNOSIS — M50922 Unspecified cervical disc disorder at C5-C6 level: Secondary | ICD-10-CM | POA: Diagnosis not present

## 2016-02-21 DIAGNOSIS — M50923 Unspecified cervical disc disorder at C6-C7 level: Secondary | ICD-10-CM | POA: Diagnosis not present

## 2016-02-25 ENCOUNTER — Other Ambulatory Visit: Payer: BLUE CROSS/BLUE SHIELD

## 2016-02-26 DIAGNOSIS — G894 Chronic pain syndrome: Secondary | ICD-10-CM | POA: Diagnosis not present

## 2016-02-26 DIAGNOSIS — M5136 Other intervertebral disc degeneration, lumbar region: Secondary | ICD-10-CM | POA: Diagnosis not present

## 2016-02-27 ENCOUNTER — Ambulatory Visit: Payer: BLUE CROSS/BLUE SHIELD | Admitting: Family Medicine

## 2016-02-27 DIAGNOSIS — F411 Generalized anxiety disorder: Secondary | ICD-10-CM | POA: Diagnosis not present

## 2016-02-28 DIAGNOSIS — M50922 Unspecified cervical disc disorder at C5-C6 level: Secondary | ICD-10-CM | POA: Diagnosis not present

## 2016-02-28 DIAGNOSIS — M50923 Unspecified cervical disc disorder at C6-C7 level: Secondary | ICD-10-CM | POA: Diagnosis not present

## 2016-02-28 DIAGNOSIS — M5136 Other intervertebral disc degeneration, lumbar region: Secondary | ICD-10-CM | POA: Diagnosis not present

## 2016-02-28 DIAGNOSIS — M25511 Pain in right shoulder: Secondary | ICD-10-CM | POA: Diagnosis not present

## 2016-02-29 DIAGNOSIS — G5761 Lesion of plantar nerve, right lower limb: Secondary | ICD-10-CM | POA: Diagnosis not present

## 2016-02-29 DIAGNOSIS — M25571 Pain in right ankle and joints of right foot: Secondary | ICD-10-CM | POA: Diagnosis not present

## 2016-03-03 DIAGNOSIS — L821 Other seborrheic keratosis: Secondary | ICD-10-CM | POA: Diagnosis not present

## 2016-03-03 DIAGNOSIS — L570 Actinic keratosis: Secondary | ICD-10-CM | POA: Diagnosis not present

## 2016-03-03 DIAGNOSIS — D1801 Hemangioma of skin and subcutaneous tissue: Secondary | ICD-10-CM | POA: Diagnosis not present

## 2016-03-03 DIAGNOSIS — Z86008 Personal history of in-situ neoplasm of other site: Secondary | ICD-10-CM | POA: Diagnosis not present

## 2016-03-03 DIAGNOSIS — L814 Other melanin hyperpigmentation: Secondary | ICD-10-CM | POA: Diagnosis not present

## 2016-03-06 ENCOUNTER — Other Ambulatory Visit: Payer: Self-pay | Admitting: Family Medicine

## 2016-03-06 DIAGNOSIS — M25511 Pain in right shoulder: Secondary | ICD-10-CM | POA: Diagnosis not present

## 2016-03-06 DIAGNOSIS — M50923 Unspecified cervical disc disorder at C6-C7 level: Secondary | ICD-10-CM | POA: Diagnosis not present

## 2016-03-06 DIAGNOSIS — M5136 Other intervertebral disc degeneration, lumbar region: Secondary | ICD-10-CM | POA: Diagnosis not present

## 2016-03-06 DIAGNOSIS — M50922 Unspecified cervical disc disorder at C5-C6 level: Secondary | ICD-10-CM | POA: Diagnosis not present

## 2016-03-13 DIAGNOSIS — M50923 Unspecified cervical disc disorder at C6-C7 level: Secondary | ICD-10-CM | POA: Diagnosis not present

## 2016-03-13 DIAGNOSIS — M50922 Unspecified cervical disc disorder at C5-C6 level: Secondary | ICD-10-CM | POA: Diagnosis not present

## 2016-03-13 DIAGNOSIS — M5136 Other intervertebral disc degeneration, lumbar region: Secondary | ICD-10-CM | POA: Diagnosis not present

## 2016-03-13 DIAGNOSIS — M25511 Pain in right shoulder: Secondary | ICD-10-CM | POA: Diagnosis not present

## 2016-03-14 ENCOUNTER — Ambulatory Visit (INDEPENDENT_AMBULATORY_CARE_PROVIDER_SITE_OTHER): Payer: BLUE CROSS/BLUE SHIELD | Admitting: Adult Health

## 2016-03-14 ENCOUNTER — Encounter: Payer: Self-pay | Admitting: Adult Health

## 2016-03-14 VITALS — BP 106/70 | Temp 98.1°F | Ht 74.0 in | Wt 209.6 lb

## 2016-03-14 DIAGNOSIS — A084 Viral intestinal infection, unspecified: Secondary | ICD-10-CM

## 2016-03-14 MED ORDER — ONDANSETRON HCL 4 MG PO TABS: 4.0000 mg | ORAL_TABLET | Freq: Three times a day (TID) | ORAL | 0 refills | Status: DC | PRN

## 2016-03-14 NOTE — Progress Notes (Signed)
Subjective:    Patient ID: Peter Snyder, male    DOB: 02-Aug-1955, 60 y.o.   MRN: [id number]  HPI  60 year old male who presents to the office today for the acute complaint of abdominal pain and diarrhea. He was seen by Dr. Elease Hashimoto about 2 weeks ago for this same issue and had since resolved until 3 days ago. He reports that he had multiple episodes of diarrhea with intermittent nausea. He took over the counter imodium last night and then again this morning, which has in turn resolved his diarrhea but he continues to have cramping and nausea. He has taken Zofran  Which has helped with his nausea. He denies any vomiting and has been able to keep fluids down. He had mashed potatoes and mac and cheese for lunch today and has not had any difficulty    Review of Systems  Constitutional: Positive for appetite change.  Gastrointestinal: Positive for abdominal pain, diarrhea and nausea. Negative for abdominal distention and anal bleeding.  Musculoskeletal: Negative.   Skin: Negative.   All other systems reviewed and are negative.  Past Medical History:  Diagnosis Date  . Arthritis   . CAD (coronary artery disease) 6/14   Cardiac catheterization 6/27/ 2014 ejection fraction 35-40%, 30% proximal left circumflex, 100% tiny obtuse marginal 1 with collaterals, 50% LAD, 50% D1, 100% RCA with collaterals  . Chicken pox   . Colon polyps   . Depression   . Diabetes mellitus ([place])    Diet controlled  . GERD (gastroesophageal reflux disease)   . Hyperlipidemia   . Hypertension   . Kidney stone     Social History   Social History  . Marital status: Married    Spouse name: N/A  . Number of children: 3  . Years of education: N/A   Occupational History  .      Retired   Social History Main Topics  . Smoking status: Never Smoker  . Smokeless tobacco: Not on file  . Alcohol use No  . Drug use: No  . Sexual activity: Not on file   Other Topics Concern  . Not on file   Social History  Narrative  . No narrative on file    Past Surgical History:  Procedure Laterality Date  . TONSILLECTOMY  1963    Family History  Problem Relation Age of Onset  . Arthritis Father   . Hyperlipidemia Maternal Grandmother   . Heart disease Maternal Grandmother   . Arthritis Paternal Grandmother     Allergies  Allergen Reactions  . Sulfa Antibiotics Rash    Current Outpatient Prescriptions on File Prior to Visit  Medication Sig Dispense Refill  . acetaminophen (TYLENOL) 500 MG tablet Take 500 mg by mouth every 6 (six) hours as needed for moderate pain.    Marland Kitchen ALPRAZolam (XANAX) 0.25 MG tablet Take 0.25 mg by mouth daily.     Marland Kitchen aspirin 81 MG tablet Take 81 mg by mouth daily.    Marland Kitchen atorvastatin (LIPITOR) 80 MG tablet TAKE 1 TABLET BY MOUTH EVERY DAY 90 tablet 3  . AZASITE 1 % ophthalmic solution INSTILL 1 DROP INTO AFFECTED EYE AT BEDTIME  4  . Bismuth Tribromoph-Petrolatum (XEROFORM PETROLAT PATCH 4"X4") PADS USE AS DIRECTED TWICE A DAY EXTERNALLY 30 DAYS  4  . carvedilol (COREG) 12.5 MG tablet Take 1 tablet (12.5 mg total) by mouth 2 (two) times daily with a meal. 180 tablet 3  . celecoxib (CELEBREX) 200 MG capsule Take 200  mg by mouth daily.     . clonazePAM (KLONOPIN) 0.5 MG tablet Take 0.5 mg by mouth at bedtime.     Marland Kitchen ELIDEL 1 % cream APPLY TO AFFECTED AREA TWICE A DAY AS NEEDED FOR 14 DAYS  2  . escitalopram (LEXAPRO) 20 MG tablet Take 20 mg by mouth daily.    Marland Kitchen ezetimibe (ZETIA) 10 MG tablet Take 1 tablet (10 mg total) by mouth daily. 90 tablet 1  . fluticasone (FLONASE) 50 MCG/ACT nasal spray Place 2 sprays into both nostrils daily. 16 g 6  . gabapentin (NEURONTIN) 600 MG tablet TAKE 1 TABLET (600 MG TOTAL) BY MOUTH 3 (THREE) TIMES DAILY. 270 tablet 3  . lisinopril (PRINIVIL,ZESTRIL) 20 MG tablet Take 20 mg by mouth daily.    . pantoprazole (PROTONIX) 40 MG tablet TAKE 1 TABLET (40 MG TOTAL) BY MOUTH DAILY. 90 tablet 0  . RESTASIS 0.05 % ophthalmic emulsion Place 1 drop into  both eyes 2 (two) times daily.     . tamsulosin (FLOMAX) 0.4 MG CAPS capsule TAKE 1 CAPSULE BY MOUTH 1/2 HOUR FOLLOWING THE SAME MEAL EACH DAY 30 capsule 2  . VOLTAREN 1 % GEL Apply 2 g topically daily.     Marland Kitchen zolpidem (AMBIEN) 10 MG tablet Take 10 mg by mouth at bedtime as needed for sleep.     No current facility-administered medications on file prior to visit.     BP 106/70   Temp 98.1 F (36.7 C) (Oral)   Ht 6\' 2"  (1.88 m)   Wt 209 lb 9.6 oz (95.1 kg)   BMI 26.91 kg/m       Objective:   Physical Exam  Constitutional: He is oriented to person, place, and time. He appears well-developed and well-nourished. No distress.  Cardiovascular: Normal rate, regular rhythm, normal heart sounds and intact distal pulses.  Exam reveals no gallop and no friction rub.   No murmur heard. Pulmonary/Chest: Effort normal and breath sounds normal. No respiratory distress. He has no wheezes. He has no rales. He exhibits no tenderness.  Abdominal: Soft. Normal appearance and bowel sounds are normal. He exhibits no distension and no mass. There is no hepatosplenomegaly, splenomegaly or hepatomegaly. There is generalized tenderness. There is no rebound, no guarding and no CVA tenderness.  Neurological: He is alert and oriented to person, place, and time.  Skin: Skin is warm and dry. No rash noted. He is not diaphoretic. No erythema. No pallor.  Psychiatric: He has a normal mood and affect. His behavior is normal. Judgment and thought content normal.  Nursing note and vitals reviewed.     Assessment & Plan:  1. Viral gastroenteritis - Suspect viral illness. Can take Zofran as needed. Continue to force fluids. BRAT diet  - ondansetron (ZOFRAN) 4 MG tablet; Take 1 tablet (4 mg total) by mouth every 8 (eight) hours as needed for nausea or vomiting.  Dispense: 20 tablet; Refill: 0   Dorothyann Peng, NP

## 2016-03-18 ENCOUNTER — Other Ambulatory Visit: Payer: BLUE CROSS/BLUE SHIELD

## 2016-03-19 DIAGNOSIS — F411 Generalized anxiety disorder: Secondary | ICD-10-CM | POA: Diagnosis not present

## 2016-03-20 ENCOUNTER — Other Ambulatory Visit (INDEPENDENT_AMBULATORY_CARE_PROVIDER_SITE_OTHER): Payer: BLUE CROSS/BLUE SHIELD

## 2016-03-20 DIAGNOSIS — M50923 Unspecified cervical disc disorder at C6-C7 level: Secondary | ICD-10-CM | POA: Diagnosis not present

## 2016-03-20 DIAGNOSIS — M25511 Pain in right shoulder: Secondary | ICD-10-CM | POA: Diagnosis not present

## 2016-03-20 DIAGNOSIS — R7989 Other specified abnormal findings of blood chemistry: Secondary | ICD-10-CM | POA: Diagnosis not present

## 2016-03-20 DIAGNOSIS — Z0000 Encounter for general adult medical examination without abnormal findings: Secondary | ICD-10-CM

## 2016-03-20 DIAGNOSIS — M50922 Unspecified cervical disc disorder at C5-C6 level: Secondary | ICD-10-CM | POA: Diagnosis not present

## 2016-03-20 DIAGNOSIS — M5136 Other intervertebral disc degeneration, lumbar region: Secondary | ICD-10-CM | POA: Diagnosis not present

## 2016-03-20 LAB — BASIC METABOLIC PANEL
BUN: 15 mg/dL (ref 6–23)
CHLORIDE: 103 meq/L (ref 96–112)
CO2: 31 mEq/L (ref 19–32)
CREATININE: 1.05 mg/dL (ref 0.40–1.50)
Calcium: 9.3 mg/dL (ref 8.4–10.5)
GFR: 76.36 mL/min (ref >60.00)
Glucose, Bld: 118 mg/dL — ABNORMAL HIGH (ref 70–99)
POTASSIUM: 4.5 meq/L (ref 3.5–5.1)
SODIUM: 140 meq/L (ref 135–145)

## 2016-03-20 LAB — CBC WITH DIFFERENTIAL/PLATELET
BASOS PCT: 0.6 % (ref 0.0–3.0)
Basophils Absolute: 0 10*3/uL (ref 0.0–0.1)
EOS ABS: 0.1 10*3/uL (ref 0.0–0.7)
Eosinophils Relative: 2.2 % (ref 0.0–5.0)
HEMATOCRIT: 39.7 % (ref 39.0–52.0)
HEMOGLOBIN: 13.8 g/dL (ref 13.0–17.0)
LYMPHS PCT: 40.1 % (ref 12.0–46.0)
Lymphs Abs: 1.9 10*3/uL (ref 0.7–4.0)
MCHC: 34.7 g/dL (ref 30.0–36.0)
MCV: 91.9 fl (ref 78.0–100.0)
MONOS PCT: 7.6 % (ref 3.0–12.0)
Monocytes Absolute: 0.4 10*3/uL (ref 0.1–1.0)
NEUTROS ABS: 2.4 10*3/uL (ref 1.4–7.7)
Neutrophils Relative %: 49.5 % (ref 43.0–77.0)
Platelets: 175 10*3/uL (ref 150.0–400.0)
RBC: 4.32 Mil/uL (ref 4.22–5.81)
RDW: 12.5 % (ref 11.5–15.5)
WBC: 4.8 10*3/uL (ref 4.0–10.5)

## 2016-03-20 LAB — LDL CHOLESTEROL, DIRECT: Direct LDL: 70 mg/dL

## 2016-03-20 LAB — MICROALBUMIN / CREATININE URINE RATIO
Creatinine,U: 79.1 mg/dL
MICROALB/CREAT RATIO: 0.9 mg/g (ref 0.0–30.0)
Microalb, Ur: <0.7 mg/dL (ref 0.0–1.9)

## 2016-03-20 LAB — HEMOGLOBIN A1C: HEMOGLOBIN A1C: 6.1 % (ref 4.6–6.5)

## 2016-03-20 LAB — PSA: PSA: 1.6 ng/mL (ref 0.10–4.00)

## 2016-03-20 LAB — HEPATIC FUNCTION PANEL
ALK PHOS: 72 U/L (ref 39–117)
ALT: 31 U/L (ref 0–53)
AST: 25 U/L (ref 0–37)
Albumin: 4.5 g/dL (ref 3.5–5.2)
BILIRUBIN TOTAL: 0.4 mg/dL (ref 0.2–1.2)
Bilirubin, Direct: 0.1 mg/dL (ref 0.0–0.3)
Total Protein: 6.8 g/dL (ref 6.0–8.3)

## 2016-03-20 LAB — LIPID PANEL
Cholesterol: 129 mg/dL (ref 0–200)
HDL: 28.5 mg/dL — ABNORMAL LOW (ref >39.00)
NONHDL: 100.39
Total CHOL/HDL Ratio: 5
Triglycerides: 214 mg/dL — ABNORMAL HIGH (ref 0.0–149.0)
VLDL: 42.8 mg/dL — ABNORMAL HIGH (ref 0.0–40.0)

## 2016-03-20 LAB — TSH: TSH: 1.42 u[IU]/mL (ref 0.35–4.50)

## 2016-03-21 ENCOUNTER — Encounter: Payer: Self-pay | Admitting: Family Medicine

## 2016-03-21 ENCOUNTER — Ambulatory Visit (INDEPENDENT_AMBULATORY_CARE_PROVIDER_SITE_OTHER): Payer: BLUE CROSS/BLUE SHIELD | Admitting: Family Medicine

## 2016-03-21 VITALS — BP 120/68 | HR 90 | Temp 97.8°F | Ht 74.0 in | Wt 207.6 lb

## 2016-03-21 DIAGNOSIS — R7303 Prediabetes: Secondary | ICD-10-CM

## 2016-03-21 DIAGNOSIS — I2583 Coronary atherosclerosis due to lipid rich plaque: Secondary | ICD-10-CM

## 2016-03-21 DIAGNOSIS — I10 Essential (primary) hypertension: Secondary | ICD-10-CM

## 2016-03-21 DIAGNOSIS — I2510 Atherosclerotic heart disease of native coronary artery without angina pectoris: Secondary | ICD-10-CM

## 2016-03-21 DIAGNOSIS — N400 Enlarged prostate without lower urinary tract symptoms: Secondary | ICD-10-CM | POA: Diagnosis not present

## 2016-03-21 MED ORDER — TAMSULOSIN HCL 0.4 MG PO CAPS: 0.4000 mg | ORAL_CAPSULE | Freq: Every day | ORAL | 3 refills | Status: DC

## 2016-03-21 NOTE — Progress Notes (Signed)
Pre visit review using our clinic review tool, if applicable. No additional management support is needed unless otherwise documented below in the visit note. 

## 2016-03-21 NOTE — Patient Instructions (Signed)

## 2016-03-21 NOTE — Progress Notes (Signed)
Subjective:     Patient ID: Peter Snyder, male   DOB: 1955-04-09, 61 y.o.   MRN: [id number]  HPI Patient seen for medical follow-up. He had complete lab work done recently but was not scheduled for physical today. He has history of CAD, dyslipidemia, hypertension, ischemic cardiomyopathy, history of recurrent depression, chronic anxiety, prediabetes. He's had a couple of recent illnesses and is finally feeling better. He is trying to resume some walking past few days. No recent chest pains. His blood sugars have been stable. Walks most days of the week. Nonsmoker.  Has BPH and requesting refills of Flomax. He has not had any recent obstructive urinary symptoms. No burning with urination.  Past Medical History:  Diagnosis Date  . Arthritis   . CAD (coronary artery disease) 6/14   Cardiac catheterization 6/27/ 2014 ejection fraction 35-40%, 30% proximal left circumflex, 100% tiny obtuse marginal 1 with collaterals, 50% LAD, 50% D1, 100% RCA with collaterals  . Chicken pox   . Colon polyps   . Depression   . Diabetes mellitus ([place])    Diet controlled  . GERD (gastroesophageal reflux disease)   . Hyperlipidemia   . Hypertension   . Kidney stone    Past Surgical History:  Procedure Laterality Date  . TONSILLECTOMY  1963    reports that he has never smoked. He does not have any smokeless tobacco history on file. He reports that he does not drink alcohol or use drugs. family history includes Arthritis in his father and paternal grandmother; Heart disease in his maternal grandmother; Hyperlipidemia in his maternal grandmother. Allergies  Allergen Reactions  . Sulfa Antibiotics Rash     Review of Systems  Constitutional: Negative for chills, fatigue and fever.  HENT: Positive for congestion.   Eyes: Negative for visual disturbance.  Respiratory: Negative for cough, chest tightness and shortness of breath.   Cardiovascular: Negative for chest pain, palpitations and leg swelling.   Gastrointestinal: Negative for abdominal pain, diarrhea, nausea and vomiting.  Genitourinary: Negative for dysuria.  Neurological: Negative for dizziness, syncope, weakness, light-headedness and headaches.       Objective:   Physical Exam  Constitutional: He appears well-developed and well-nourished.  HENT:  Mouth/Throat: Oropharynx is clear and moist.  Neck: Neck supple. No thyromegaly present.  Cardiovascular: Normal rate and regular rhythm.   Pulmonary/Chest: Effort normal and breath sounds normal. No respiratory distress. He has no wheezes. He has no rales.  Musculoskeletal: He exhibits no edema.       Assessment:     #1 history of CAD/dyslipidemia  #2 hypertension stable and at goal  #3 prediabetes  #4 history of BPH    Plan:     -Refill Flomax for one year -Reviewed labs. He has high triglycerides and low HDL -Reviewed dietary management of hypertriglyceridemia -Send copy of his recent lab work to orthopedist -Hemoglobin A1c 6.1%. Continue low glycemic diet  [name] MD [facility] at [facility]

## 2016-03-24 DIAGNOSIS — M65332 Trigger finger, left middle finger: Secondary | ICD-10-CM | POA: Diagnosis not present

## 2016-03-24 DIAGNOSIS — M65331 Trigger finger, right middle finger: Secondary | ICD-10-CM | POA: Diagnosis not present

## 2016-03-24 DIAGNOSIS — M65311 Trigger thumb, right thumb: Secondary | ICD-10-CM | POA: Diagnosis not present

## 2016-03-26 DIAGNOSIS — F411 Generalized anxiety disorder: Secondary | ICD-10-CM | POA: Diagnosis not present

## 2016-03-27 DIAGNOSIS — M50923 Unspecified cervical disc disorder at C6-C7 level: Secondary | ICD-10-CM | POA: Diagnosis not present

## 2016-03-27 DIAGNOSIS — M5136 Other intervertebral disc degeneration, lumbar region: Secondary | ICD-10-CM | POA: Diagnosis not present

## 2016-03-27 DIAGNOSIS — M50922 Unspecified cervical disc disorder at C5-C6 level: Secondary | ICD-10-CM | POA: Diagnosis not present

## 2016-03-27 DIAGNOSIS — M25511 Pain in right shoulder: Secondary | ICD-10-CM | POA: Diagnosis not present

## 2016-03-28 ENCOUNTER — Other Ambulatory Visit: Payer: Self-pay | Admitting: Family Medicine

## 2016-03-28 ENCOUNTER — Ambulatory Visit (INDEPENDENT_AMBULATORY_CARE_PROVIDER_SITE_OTHER): Payer: BLUE CROSS/BLUE SHIELD | Admitting: Family Medicine

## 2016-03-28 ENCOUNTER — Encounter: Payer: Self-pay | Admitting: Family Medicine

## 2016-03-28 VITALS — BP 100/70 | HR 67 | Temp 97.5°F | Ht 74.0 in | Wt 209.0 lb

## 2016-03-28 DIAGNOSIS — J0110 Acute frontal sinusitis, unspecified: Secondary | ICD-10-CM

## 2016-03-28 MED ORDER — BENZONATATE 100 MG PO CAPS: 100.0000 mg | ORAL_CAPSULE | Freq: Two times a day (BID) | ORAL | 0 refills | Status: DC | PRN

## 2016-03-28 MED ORDER — AMOXICILLIN-POT CLAVULANATE 875-125 MG PO TABS: 1.0000 | ORAL_TABLET | Freq: Two times a day (BID) | ORAL | 0 refills | Status: DC

## 2016-03-28 NOTE — Telephone Encounter (Signed)
Please advise on medication refill for patient.

## 2016-03-28 NOTE — Progress Notes (Signed)
Pre visit review using our clinic review tool, if applicable. No additional management support is needed unless otherwise documented below in the visit note. 

## 2016-03-28 NOTE — Telephone Encounter (Signed)
PT would like tessalon pearl cough med [facility] [address]

## 2016-03-28 NOTE — Progress Notes (Signed)
Subjective:     Patient ID: Peter Snyder, male   DOB: 03/05/1956, 61 y.o.   MRN: [id number]  HPI Acute visit for 2 week history of sinus congestion, right frontal sinus pressure and brownish and bloody nasal discharge. He's had frequent headaches. Still walking but had scale back somewhat. No fever. He's tried saline irrigation and humidifier without much relief. Increased malaise. Rare cough. Denies any nausea or vomiting. History of frequent sinusitis the past  Past Medical History:  Diagnosis Date  . Arthritis   . CAD (coronary artery disease) 6/14   Cardiac catheterization 6/27/ 2014 ejection fraction 35-40%, 30% proximal left circumflex, 100% tiny obtuse marginal 1 with collaterals, 50% LAD, 50% D1, 100% RCA with collaterals  . Chicken pox   . Colon polyps   . Depression   . Diabetes mellitus ([place])    Diet controlled  . GERD (gastroesophageal reflux disease)   . Hyperlipidemia   . Hypertension   . Kidney stone    Past Surgical History:  Procedure Laterality Date  . TONSILLECTOMY  1963    reports that he has never smoked. He does not have any smokeless tobacco history on file. He reports that he does not drink alcohol or use drugs. family history includes Arthritis in his father and paternal grandmother; Heart disease in his maternal grandmother; Hyperlipidemia in his maternal grandmother. Allergies  Allergen Reactions  . Sulfa Antibiotics Rash     Review of Systems  Constitutional: Negative for chills and fever.  HENT: Positive for congestion, sinus pain and sinus pressure.   Neurological: Positive for headaches.       Objective:   Physical Exam  Constitutional: He appears well-developed and well-nourished.  HENT:  Right Ear: External ear normal.  Left Ear: External ear normal.  Mouth/Throat: Oropharynx is clear and moist.  Erythematous nasal mucosa bilaterally. He has some crusted blood and thick crusted mucus bilaterally  Neck: Neck supple.  Cardiovascular:  Normal rate and regular rhythm.   Pulmonary/Chest: Effort normal and breath sounds normal. No respiratory distress. He has no wheezes.       Assessment:     Acute sinusitis-probably frontal    Plan:     -Continue humidifier and nasal saline -Augmentin 875 mg twice daily for 10 days  [name] MD [facility] at [facility]

## 2016-03-28 NOTE — Telephone Encounter (Signed)
Tessalon perles 100 mg po q 8 hours prn cough #30

## 2016-03-28 NOTE — Telephone Encounter (Signed)
Medication sent in for patient. 

## 2016-03-28 NOTE — Patient Instructions (Signed)

## 2016-03-31 ENCOUNTER — Encounter: Payer: Self-pay | Admitting: Family Medicine

## 2016-04-01 DIAGNOSIS — M792 Neuralgia and neuritis, unspecified: Secondary | ICD-10-CM | POA: Diagnosis not present

## 2016-04-01 DIAGNOSIS — L600 Ingrowing nail: Secondary | ICD-10-CM | POA: Diagnosis not present

## 2016-04-01 DIAGNOSIS — G5761 Lesion of plantar nerve, right lower limb: Secondary | ICD-10-CM | POA: Diagnosis not present

## 2016-04-01 DIAGNOSIS — M65871 Other synovitis and tenosynovitis, right ankle and foot: Secondary | ICD-10-CM | POA: Diagnosis not present

## 2016-04-03 DIAGNOSIS — M50922 Unspecified cervical disc disorder at C5-C6 level: Secondary | ICD-10-CM | POA: Diagnosis not present

## 2016-04-03 DIAGNOSIS — M50923 Unspecified cervical disc disorder at C6-C7 level: Secondary | ICD-10-CM | POA: Diagnosis not present

## 2016-04-03 DIAGNOSIS — M5136 Other intervertebral disc degeneration, lumbar region: Secondary | ICD-10-CM | POA: Diagnosis not present

## 2016-04-03 DIAGNOSIS — M25511 Pain in right shoulder: Secondary | ICD-10-CM | POA: Diagnosis not present

## 2016-04-08 DIAGNOSIS — M50922 Unspecified cervical disc disorder at C5-C6 level: Secondary | ICD-10-CM | POA: Diagnosis not present

## 2016-04-08 DIAGNOSIS — F411 Generalized anxiety disorder: Secondary | ICD-10-CM | POA: Diagnosis not present

## 2016-04-08 DIAGNOSIS — M25511 Pain in right shoulder: Secondary | ICD-10-CM | POA: Diagnosis not present

## 2016-04-08 DIAGNOSIS — M50923 Unspecified cervical disc disorder at C6-C7 level: Secondary | ICD-10-CM | POA: Diagnosis not present

## 2016-04-08 DIAGNOSIS — M5136 Other intervertebral disc degeneration, lumbar region: Secondary | ICD-10-CM | POA: Diagnosis not present

## 2016-04-10 DIAGNOSIS — M5136 Other intervertebral disc degeneration, lumbar region: Secondary | ICD-10-CM | POA: Diagnosis not present

## 2016-04-10 DIAGNOSIS — G894 Chronic pain syndrome: Secondary | ICD-10-CM | POA: Diagnosis not present

## 2016-04-16 DIAGNOSIS — M5136 Other intervertebral disc degeneration, lumbar region: Secondary | ICD-10-CM | POA: Diagnosis not present

## 2016-04-16 DIAGNOSIS — M50922 Unspecified cervical disc disorder at C5-C6 level: Secondary | ICD-10-CM | POA: Diagnosis not present

## 2016-04-16 DIAGNOSIS — F411 Generalized anxiety disorder: Secondary | ICD-10-CM | POA: Diagnosis not present

## 2016-04-16 DIAGNOSIS — M50923 Unspecified cervical disc disorder at C6-C7 level: Secondary | ICD-10-CM | POA: Diagnosis not present

## 2016-04-16 DIAGNOSIS — M25511 Pain in right shoulder: Secondary | ICD-10-CM | POA: Diagnosis not present

## 2016-04-21 DIAGNOSIS — M79645 Pain in left finger(s): Secondary | ICD-10-CM | POA: Diagnosis not present

## 2016-04-21 DIAGNOSIS — M65311 Trigger thumb, right thumb: Secondary | ICD-10-CM | POA: Diagnosis not present

## 2016-04-21 DIAGNOSIS — M79644 Pain in right finger(s): Secondary | ICD-10-CM | POA: Diagnosis not present

## 2016-04-21 DIAGNOSIS — M65312 Trigger thumb, left thumb: Secondary | ICD-10-CM | POA: Diagnosis not present

## 2016-04-26 DIAGNOSIS — J101 Influenza due to other identified influenza virus with other respiratory manifestations: Secondary | ICD-10-CM | POA: Diagnosis not present

## 2016-04-28 ENCOUNTER — Encounter: Payer: Self-pay | Admitting: Family Medicine

## 2016-04-28 ENCOUNTER — Ambulatory Visit (INDEPENDENT_AMBULATORY_CARE_PROVIDER_SITE_OTHER): Payer: BLUE CROSS/BLUE SHIELD | Admitting: Family Medicine

## 2016-04-28 VITALS — BP 102/70 | HR 71 | Temp 98.0°F | Ht 74.0 in | Wt 212.0 lb

## 2016-04-28 DIAGNOSIS — M5136 Other intervertebral disc degeneration, lumbar region: Secondary | ICD-10-CM | POA: Diagnosis not present

## 2016-04-28 DIAGNOSIS — R69 Illness, unspecified: Secondary | ICD-10-CM | POA: Diagnosis not present

## 2016-04-28 DIAGNOSIS — J111 Influenza due to unidentified influenza virus with other respiratory manifestations: Secondary | ICD-10-CM

## 2016-04-28 NOTE — Patient Instructions (Signed)
Follow up for any fever or increased shortness of breath. 

## 2016-04-28 NOTE — Progress Notes (Signed)
Pre visit review using our clinic review tool, if applicable. No additional management support is needed unless otherwise documented below in the visit note. 

## 2016-04-28 NOTE — Progress Notes (Signed)
Subjective:     Patient ID: Peter Snyder, male   DOB: 1956/01/07, 61 y.o.   MRN: [id number]  HPI Patient was diagnosed presumptively with influenza on Saturday urgent care. He states on Friday he developed symptoms of body aches, nasal congestion, cough and mild sore throat. He was not tested for flu but started empirically on Tamiflu. Overall feels slightly better. Says have some fatigue and cough but has never had any documented fever. Denies any nausea, vomiting, or diarrhea. Wife with similar symptoms and she is now on Tamiflu as well. No chest pains.  Past Medical History:  Diagnosis Date  . Arthritis   . CAD (coronary artery disease) 6/14   Cardiac catheterization 6/27/ 2014 ejection fraction 35-40%, 30% proximal left circumflex, 100% tiny obtuse marginal 1 with collaterals, 50% LAD, 50% D1, 100% RCA with collaterals  . Chicken pox   . Colon polyps   . Depression   . Diabetes mellitus ([place])    Diet controlled  . GERD (gastroesophageal reflux disease)   . Hyperlipidemia   . Hypertension   . Kidney stone    Past Surgical History:  Procedure Laterality Date  . TONSILLECTOMY  1963    reports that he has never smoked. He has never used smokeless tobacco. He reports that he does not drink alcohol or use drugs. family history includes Arthritis in his father and paternal grandmother; Heart disease in his maternal grandmother; Hyperlipidemia in his maternal grandmother. Allergies  Allergen Reactions  . Sulfa Antibiotics Rash     Review of Systems  Constitutional: Positive for fatigue. Negative for fever.  HENT: Positive for congestion.   Respiratory: Positive for cough.   Cardiovascular: Negative for chest pain.       Objective:   Physical Exam  Constitutional: He appears well-developed and well-nourished.  HENT:  Right Ear: External ear normal.  Left Ear: External ear normal.  Mouth/Throat: Oropharynx is clear and moist.  Neck: Neck supple.  Cardiovascular: Normal rate.    Pulmonary/Chest: Effort normal and breath sounds normal. No respiratory distress. He has no wheezes. He has no rales.  Lymphadenopathy:    He has no cervical adenopathy.       Assessment:     Viral syndrome. Possible influenza currently on treatment with Tamiflu    Plan:     -Finish out Tamiflu -Follow-up for any recurrent fever or other concerns  [name] MD [facility] at [facility]

## 2016-04-30 DIAGNOSIS — M50923 Unspecified cervical disc disorder at C6-C7 level: Secondary | ICD-10-CM | POA: Diagnosis not present

## 2016-04-30 DIAGNOSIS — M50922 Unspecified cervical disc disorder at C5-C6 level: Secondary | ICD-10-CM | POA: Diagnosis not present

## 2016-04-30 DIAGNOSIS — M5136 Other intervertebral disc degeneration, lumbar region: Secondary | ICD-10-CM | POA: Diagnosis not present

## 2016-04-30 DIAGNOSIS — M25511 Pain in right shoulder: Secondary | ICD-10-CM | POA: Diagnosis not present

## 2016-05-01 DIAGNOSIS — F411 Generalized anxiety disorder: Secondary | ICD-10-CM | POA: Diagnosis not present

## 2016-05-02 ENCOUNTER — Encounter: Payer: Self-pay | Admitting: Family Medicine

## 2016-05-02 ENCOUNTER — Telehealth: Payer: Self-pay | Admitting: Family Medicine

## 2016-05-02 NOTE — Telephone Encounter (Signed)
Patient Name: Peter Snyder DOB: 19-Apr-1955 Initial Comment Caller states c/o lingering cough after being diagnosed with the flu. Nurse Assessment Nurse: Marcelline Deist, RN, Lynda Date/Time (Eastern Time): 05/02/2016 12:16:04 PM Confirm and document reason for call. If symptomatic, describe symptoms. ---Caller states c/o lingering cough after being diagnosed with the flu. Seen at [facility] Sat., dx with flu. Saw his Dr. on Monday. No fever. Took Tamiflu for 5 days. Does the patient have any new or worsening symptoms? ---Yes Will a triage be completed? ---Yes Related visit to physician within the last 2 weeks? ---Yes Does the PT have any chronic conditions? (i.e. diabetes, asthma, etc.) ---Yes List chronic conditions. ---on heart & BP rx, blocked Right coronary artery, back issues. Is this a behavioral health or substance abuse call? ---No Guidelines Guideline Title Affirmed Question Affirmed Notes Influenza Follow-up Call Influenza, questions about Final Disposition [place], RN, Lynda Comments Caller is using Tessulon Pearls, humidifier, etc. Wondering how long cough should last. No new or worsening symptoms. Disagree/Comply: Comply

## 2016-05-02 NOTE — Telephone Encounter (Signed)
Spoke with Peter Snyder at [place] and note is in system and nothing further needed.

## 2016-05-06 DIAGNOSIS — M50922 Unspecified cervical disc disorder at C5-C6 level: Secondary | ICD-10-CM | POA: Diagnosis not present

## 2016-05-06 DIAGNOSIS — M50923 Unspecified cervical disc disorder at C6-C7 level: Secondary | ICD-10-CM | POA: Diagnosis not present

## 2016-05-06 DIAGNOSIS — M5136 Other intervertebral disc degeneration, lumbar region: Secondary | ICD-10-CM | POA: Diagnosis not present

## 2016-05-06 DIAGNOSIS — M25511 Pain in right shoulder: Secondary | ICD-10-CM | POA: Diagnosis not present

## 2016-05-08 ENCOUNTER — Ambulatory Visit (INDEPENDENT_AMBULATORY_CARE_PROVIDER_SITE_OTHER): Payer: BLUE CROSS/BLUE SHIELD | Admitting: Adult Health

## 2016-05-08 ENCOUNTER — Encounter: Payer: Self-pay | Admitting: Adult Health

## 2016-05-08 VITALS — BP 118/64 | Temp 97.7°F | Ht 73.0 in | Wt 210.4 lb

## 2016-05-08 DIAGNOSIS — B349 Viral infection, unspecified: Secondary | ICD-10-CM | POA: Diagnosis not present

## 2016-05-08 NOTE — Progress Notes (Signed)
Subjective:    Patient ID: Peter Snyder, male    DOB: 1956/01/04, 61 y.o.   MRN: [id number]  HPI   Patient was presumptively diagnosed  with influenza last week and completed Tamiflu therapy. He continues to have fatigue and a dry cough. He denies any fevers. No shortness of breath, chest pain, n/v/d.    Review of Systems  Constitutional: Positive for fatigue.  HENT: Positive for congestion.   Respiratory: Positive for cough. Negative for chest tightness, shortness of breath and wheezing.   Cardiovascular: Negative.    Past Medical History:  Diagnosis Date  . Arthritis   . CAD (coronary artery disease) 6/14   Cardiac catheterization 6/27/ 2014 ejection fraction 35-40%, 30% proximal left circumflex, 100% tiny obtuse marginal 1 with collaterals, 50% LAD, 50% D1, 100% RCA with collaterals  . Chicken pox   . Colon polyps   . Depression   . Diabetes mellitus ([place])    Diet controlled  . GERD (gastroesophageal reflux disease)   . Hyperlipidemia   . Hypertension   . Kidney stone     Social History   Social History  . Marital status: Married    Spouse name: N/A  . Number of children: 3  . Years of education: N/A   Occupational History  .      Retired   Social History Main Topics  . Smoking status: Never Smoker  . Smokeless tobacco: Never Used  . Alcohol use No  . Drug use: No  . Sexual activity: Not on file   Other Topics Concern  . Not on file   Social History Narrative  . No narrative on file    Past Surgical History:  Procedure Laterality Date  . TONSILLECTOMY  1963    Family History  Problem Relation Age of Onset  . Arthritis Father   . Hyperlipidemia Maternal Grandmother   . Heart disease Maternal Grandmother   . Arthritis Paternal Grandmother     Allergies  Allergen Reactions  . Sulfa Antibiotics Rash    Current Outpatient Prescriptions on File Prior to Visit  Medication Sig Dispense Refill  . acetaminophen (TYLENOL) 500 MG tablet Take 500  mg by mouth every 6 (six) hours as needed for moderate pain.    Marland Kitchen ALPRAZolam (XANAX) 0.25 MG tablet Take 0.25 mg by mouth daily.     Marland Kitchen aspirin 81 MG tablet Take 81 mg by mouth daily.    Marland Kitchen atorvastatin (LIPITOR) 80 MG tablet TAKE 1 TABLET BY MOUTH EVERY DAY 90 tablet 3  . AZASITE 1 % ophthalmic solution INSTILL 1 DROP INTO AFFECTED EYE AT BEDTIME  4  . benzonatate (TESSALON) 100 MG capsule Take 1 capsule (100 mg total) by mouth 2 (two) times daily as needed for cough. 30 capsule 0  . Bismuth Tribromoph-Petrolatum (XEROFORM PETROLAT PATCH 4"X4") PADS USE AS DIRECTED TWICE A DAY EXTERNALLY 30 DAYS  4  . carvedilol (COREG) 12.5 MG tablet Take 1 tablet (12.5 mg total) by mouth 2 (two) times daily with a meal. 180 tablet 3  . celecoxib (CELEBREX) 200 MG capsule Take 200 mg by mouth daily.     . clonazePAM (KLONOPIN) 0.5 MG tablet Take 0.5 mg by mouth at bedtime.     . Dermatological Products, Misc. (TETRIX) CREA Apply topically See admin instructions.  3  . ELIDEL 1 % cream APPLY TO AFFECTED AREA TWICE A DAY AS NEEDED FOR 14 DAYS  2  . escitalopram (LEXAPRO) 20 MG tablet Take 20 mg by  mouth daily.    Marland Kitchen ezetimibe (ZETIA) 10 MG tablet Take 1 tablet (10 mg total) by mouth daily. 90 tablet 1  . fluticasone (FLONASE) 50 MCG/ACT nasal spray Place 2 sprays into both nostrils daily. 16 g 6  . gabapentin (NEURONTIN) 600 MG tablet TAKE 1 TABLET (600 MG TOTAL) BY MOUTH 3 (THREE) TIMES DAILY. 270 tablet 3  . lisinopril (PRINIVIL,ZESTRIL) 20 MG tablet Take 20 mg by mouth daily.    Marland Kitchen oseltamivir (TAMIFLU) 75 MG capsule     . pantoprazole (PROTONIX) 40 MG tablet TAKE 1 TABLET (40 MG TOTAL) BY MOUTH DAILY. 90 tablet 0  . RESTASIS 0.05 % ophthalmic emulsion Place 1 drop into both eyes 2 (two) times daily.     . tacrolimus (PROTOPIC) 0.1 % ointment     . tamsulosin (FLOMAX) 0.4 MG CAPS capsule Take 1 capsule (0.4 mg total) by mouth daily after supper. 90 capsule 3  . VOLTAREN 1 % GEL Apply 2 g topically daily.     Marland Kitchen  zolpidem (AMBIEN) 10 MG tablet Take 10 mg by mouth at bedtime as needed for sleep.     No current facility-administered medications on file prior to visit.     BP 118/64   Temp 97.7 F (36.5 C) (Oral)   Ht 6\' 1"  (1.854 m)   Wt 210 lb 6.4 oz (95.4 kg)   BMI 27.76 kg/m       Objective:   Physical Exam  Constitutional: He is oriented to person, place, and time. He appears well-developed and well-nourished. No distress.  Cardiovascular: Normal rate, regular rhythm, normal heart sounds and intact distal pulses.  Exam reveals no gallop and no friction rub.   No murmur heard. Pulmonary/Chest: Effort normal and breath sounds normal. No respiratory distress. He has no wheezes. He has no rales. He exhibits no tenderness.  Musculoskeletal: Normal range of motion. He exhibits no edema, tenderness or deformity.  Neurological: He is alert and oriented to person, place, and time.  Skin: Skin is warm and dry. No rash noted. He is not diaphoretic. No erythema. No pallor.  Psychiatric: He has a normal mood and affect. His behavior is normal. Judgment and thought content normal.  Nursing note and vitals reviewed.     Assessment & Plan:  1. Viral illness - possibly residual from flu. Continue to stay hydrated, rest and take tessalon pearls that were recently prescribed.  - Follow up as needed  Dorothyann Peng, NP

## 2016-05-14 ENCOUNTER — Ambulatory Visit (INDEPENDENT_AMBULATORY_CARE_PROVIDER_SITE_OTHER): Payer: BLUE CROSS/BLUE SHIELD | Admitting: Family Medicine

## 2016-05-14 ENCOUNTER — Encounter: Payer: Self-pay | Admitting: Family Medicine

## 2016-05-14 VITALS — BP 130/80 | HR 77 | Temp 97.6°F | Ht 73.0 in | Wt 209.3 lb

## 2016-05-14 DIAGNOSIS — M25511 Pain in right shoulder: Secondary | ICD-10-CM | POA: Diagnosis not present

## 2016-05-14 DIAGNOSIS — M5136 Other intervertebral disc degeneration, lumbar region: Secondary | ICD-10-CM | POA: Diagnosis not present

## 2016-05-14 DIAGNOSIS — M50922 Unspecified cervical disc disorder at C5-C6 level: Secondary | ICD-10-CM | POA: Diagnosis not present

## 2016-05-14 DIAGNOSIS — M50923 Unspecified cervical disc disorder at C6-C7 level: Secondary | ICD-10-CM | POA: Diagnosis not present

## 2016-05-14 DIAGNOSIS — K120 Recurrent oral aphthae: Secondary | ICD-10-CM

## 2016-05-14 MED ORDER — DEXAMETHASONE 0.5 MG/5ML PO ELIX: 1.0000 mg | ORAL_SOLUTION | Freq: Four times a day (QID) | ORAL | 0 refills | Status: DC | PRN

## 2016-05-14 NOTE — Progress Notes (Signed)
Pre visit review using our clinic review tool, if applicable. No additional management support is needed unless otherwise documented below in the visit note. 

## 2016-05-14 NOTE — Progress Notes (Signed)
Subjective:     Patient ID: Peter Snyder, male   DOB: [birth year], 60 y.o.   MRN: [id number]  HPI Patient had recent upper respiratory infection. He is seen now with couple aphthous ulcers in his mouth including on the left side of his tongue and right buccal mucosa. He's had history of similar ulcers in the past. These have usually responded well to things like Magic mouthwash. He denies any sore throat. No fevers or chills. Occasional cough. Still exercising 30 minutes daily on treadmill. No recent chest pains. Nonsmoker.  Past Medical History:  Diagnosis Date  . Arthritis   . CAD (coronary artery disease) 6/14   Cardiac catheterization 6/27/ 2014 ejection fraction 35-40%, 30% proximal left circumflex, 100% tiny obtuse marginal 1 with collaterals, 50% LAD, 50% D1, 100% RCA with collaterals  . Chicken pox   . Colon polyps   . Depression   . Diabetes mellitus ([place])    Diet controlled  . GERD (gastroesophageal reflux disease)   . Hyperlipidemia   . Hypertension   . Kidney stone    Past Surgical History:  Procedure Laterality Date  . TONSILLECTOMY  1963    reports that he has never smoked. He has never used smokeless tobacco. He reports that he does not drink alcohol or use drugs. family history includes Arthritis in his father and paternal grandmother; Heart disease in his maternal grandmother; Hyperlipidemia in his maternal grandmother. Allergies  Allergen Reactions  . Sulfa Antibiotics Rash     Review of Systems  Constitutional: Negative for appetite change, chills and fever.  HENT: Negative for sore throat.   Respiratory: Negative for shortness of breath.        Objective:   Physical Exam  Constitutional: He appears well-developed and well-nourished.  HENT:  Right Ear: External ear normal.  Left Ear: External ear normal.  He has a couple of small superficial aphthous type ulcers including left of the tongue and right buccal mucosa  Neck: Neck supple.  Cardiovascular:  Normal rate and regular rhythm.   Pulmonary/Chest: Effort normal and breath sounds normal. No respiratory distress. He has no wheezes. He has no rales.  Lymphadenopathy:    He has no cervical adenopathy.       Assessment:     Aphthous ulcers    Plan:     -Dexamethasone elixir 1-2 teaspoons every 6 hours as needed -Touch base in 1-2 weeks if not healing  [name] MD [facility] at [facility]

## 2016-05-14 NOTE — Patient Instructions (Signed)
Canker Sores Canker sores are small, painful sores that develop inside your mouth. They may also be called aphthous ulcers. You can get canker sores on the inside of your lips or cheeks, on your tongue, or anywhere inside your mouth. You can have just one canker sore or several of them. Canker sores cannot be passed from one person to another (noncontagious). These sores are different than the sores that you may get on the outside of your lips (cold sores or fever blisters). Canker sores usually start as painful red bumps. Then they turn into small white, yellow, or gray ulcers that have red borders. The ulcers may be quite painful. The pain may be worse when you eat or drink. What are the causes? The cause of this condition is not known. What increases the risk? This condition is more likely to develop in:  Women.  People in their teens or 20s.  Women who are having their menstrual period.  People who are under a lot of emotional stress.  People who do not get enough iron or B vitamins.  People who have poor oral hygiene.  People who have an injury inside the mouth. This can happen after having dental work or from chewing something hard. What are the signs or symptoms? Along with the canker sore, symptoms may also include:  Fever.  Fatigue.  Swollen lymph nodes in your neck. How is this diagnosed? This condition can be diagnosed based on your symptoms. Your health care provider will also examine your mouth. Your health care provider may also do tests if you get canker sores often or if they are very bad. Tests may include:  Blood tests to rule out other causes of canker sores.  Taking swabs from the sore to check for infection.  Taking a small piece of skin from the sore (biopsy) to test it for cancer. How is this treated? Most canker sores clear up without treatment in about 10 days. Home care is usually the only treatment that you will need. Over-the-counter medicines can  relieve discomfort.If you have severe canker sores, your health care provider may prescribe:  Numbing ointment to relieve pain.  Vitamins.  Steroid medicines. These may be given as:  Oral pills.  Mouth rinses.  Gels.  Antibiotic mouth rinse. Follow these instructions at home:  Apply, take, or use medicines only as directed by your health care provider. These include vitamins.  If you were prescribed an antibiotic mouth rinse, finish all of it even if you start to feel better.  Until the sores are healed:  Do not drink coffee or citrus juices.  Do not eat spicy or salty foods.  Use a mild, over-the-counter mouth rinse as directed by your health care provider.  Practice good oral hygiene.  Floss your teeth every day.  Brush your teeth with a soft brush twice each day. Contact a health care provider if:  Your symptoms do not get better after two weeks.  You also have a fever or swollen glands.  You get canker sores often.  You have a canker sore that is getting larger.  You cannot eat or drink due to your canker sores. This information is not intended to replace advice given to you by your health care provider. Make sure you discuss any questions you have with your health care provider. Document Released: 06/28/2010 Document Revised: 08/09/2015 Document Reviewed: 02/01/2014 Elsevier Interactive Patient Education  2017 Elsevier Inc.  

## 2016-05-15 DIAGNOSIS — F411 Generalized anxiety disorder: Secondary | ICD-10-CM | POA: Diagnosis not present

## 2016-05-16 ENCOUNTER — Encounter: Payer: Self-pay | Admitting: Cardiology

## 2016-05-19 ENCOUNTER — Other Ambulatory Visit: Payer: Self-pay | Admitting: *Deleted

## 2016-05-19 ENCOUNTER — Encounter: Payer: Self-pay | Admitting: *Deleted

## 2016-05-19 DIAGNOSIS — I77810 Thoracic aortic ectasia: Secondary | ICD-10-CM

## 2016-05-21 ENCOUNTER — Other Ambulatory Visit: Payer: Self-pay | Admitting: *Deleted

## 2016-05-21 DIAGNOSIS — M50923 Unspecified cervical disc disorder at C6-C7 level: Secondary | ICD-10-CM | POA: Diagnosis not present

## 2016-05-21 DIAGNOSIS — M25511 Pain in right shoulder: Secondary | ICD-10-CM | POA: Diagnosis not present

## 2016-05-21 DIAGNOSIS — M50922 Unspecified cervical disc disorder at C5-C6 level: Secondary | ICD-10-CM | POA: Diagnosis not present

## 2016-05-21 DIAGNOSIS — M5136 Other intervertebral disc degeneration, lumbar region: Secondary | ICD-10-CM | POA: Diagnosis not present

## 2016-05-21 DIAGNOSIS — I2510 Atherosclerotic heart disease of native coronary artery without angina pectoris: Secondary | ICD-10-CM

## 2016-05-22 DIAGNOSIS — F411 Generalized anxiety disorder: Secondary | ICD-10-CM | POA: Diagnosis not present

## 2016-05-28 DIAGNOSIS — M25511 Pain in right shoulder: Secondary | ICD-10-CM | POA: Diagnosis not present

## 2016-05-28 DIAGNOSIS — M50923 Unspecified cervical disc disorder at C6-C7 level: Secondary | ICD-10-CM | POA: Diagnosis not present

## 2016-05-28 DIAGNOSIS — M50922 Unspecified cervical disc disorder at C5-C6 level: Secondary | ICD-10-CM | POA: Diagnosis not present

## 2016-05-28 DIAGNOSIS — M5136 Other intervertebral disc degeneration, lumbar region: Secondary | ICD-10-CM | POA: Diagnosis not present

## 2016-05-29 DIAGNOSIS — F411 Generalized anxiety disorder: Secondary | ICD-10-CM | POA: Diagnosis not present

## 2016-06-01 ENCOUNTER — Ambulatory Visit
Admission: RE | Admit: 2016-06-01 | Discharge: 2016-06-01 | Disposition: A | Discharge status: Home / Self Care | Payer: BLUE CROSS/BLUE SHIELD | Source: Ambulatory Visit | Attending: Cardiology | Admitting: Cardiology

## 2016-06-01 DIAGNOSIS — I712 Thoracic aortic aneurysm, without rupture: Secondary | ICD-10-CM | POA: Diagnosis not present

## 2016-06-01 DIAGNOSIS — I77810 Thoracic aortic ectasia: Secondary | ICD-10-CM

## 2016-06-01 MED ORDER — GADOBENATE DIMEGLUMINE 529 MG/ML IV SOLN: 19.0000 mL | Freq: Once | INTRAVENOUS | Status: DC | PRN

## 2016-06-02 ENCOUNTER — Encounter: Payer: Self-pay | Admitting: Cardiology

## 2016-06-03 ENCOUNTER — Encounter: Payer: Self-pay | Admitting: Family Medicine

## 2016-06-03 ENCOUNTER — Ambulatory Visit (INDEPENDENT_AMBULATORY_CARE_PROVIDER_SITE_OTHER): Payer: BLUE CROSS/BLUE SHIELD | Admitting: Family Medicine

## 2016-06-03 VITALS — BP 120/80 | HR 74 | Temp 98.4°F | Wt 211.5 lb

## 2016-06-03 DIAGNOSIS — B9789 Other viral agents as the cause of diseases classified elsewhere: Secondary | ICD-10-CM | POA: Diagnosis not present

## 2016-06-03 DIAGNOSIS — J069 Acute upper respiratory infection, unspecified: Secondary | ICD-10-CM

## 2016-06-03 MED ORDER — BENZONATATE 200 MG PO CAPS: 200.0000 mg | ORAL_CAPSULE | Freq: Three times a day (TID) | ORAL | 0 refills | Status: DC | PRN

## 2016-06-03 NOTE — Progress Notes (Signed)
Subjective:     Patient ID: Peter Snyder, male   DOB: Aug 04, 1955, 61 y.o.   MRN: [id number]  HPI Acute visit for nasal congestion and chest congestion. Onset Sunday night. No fever. He had some mild sweats last night. Cough is occasionally productive and occasionally dry. No body aches. Denies any nausea, vomiting, or diarrhea. Still exercising with walking. No wheezing. No dyspnea.  Past Medical History:  Diagnosis Date  . Arthritis   . CAD (coronary artery disease) 6/14   Cardiac catheterization 6/27/ 2014 ejection fraction 35-40%, 30% proximal left circumflex, 100% tiny obtuse marginal 1 with collaterals, 50% LAD, 50% D1, 100% RCA with collaterals  . Chicken pox   . Colon polyps   . Depression   . Diabetes mellitus ([place])    Diet controlled  . GERD (gastroesophageal reflux disease)   . Hyperlipidemia   . Hypertension   . Kidney stone    Past Surgical History:  Procedure Laterality Date  . TONSILLECTOMY  1963    reports that he has never smoked. He has never used smokeless tobacco. He reports that he does not drink alcohol or use drugs. family history includes Arthritis in his father and paternal grandmother; Heart disease in his maternal grandmother; Hyperlipidemia in his maternal grandmother. Allergies  Allergen Reactions  . Sulfa Antibiotics Rash     Review of Systems  Constitutional: Negative for fever.  HENT: Positive for congestion and sinus pressure.   Respiratory: Positive for cough.   Neurological: Negative for headaches.       Objective:   Physical Exam  Constitutional: He appears well-developed and well-nourished.  HENT:  Right Ear: External ear normal.  Left Ear: External ear normal.  Mouth/Throat: Oropharynx is clear and moist.  Neck: Neck supple.  Cardiovascular: Normal rate and regular rhythm.   Pulmonary/Chest: Effort normal and breath sounds normal. No respiratory distress. He has no wheezes. He has no rales.  Lymphadenopathy:    He has no  cervical adenopathy.       Assessment:     Probable viral URI with cough. Nonfocal exam    Plan:     -Tessalon Perles 200 mg every 8 hours as needed for cough -Increase hydration -Follow-up for any fever or worsening symptoms  [name] MD [facility] at [facility]

## 2016-06-03 NOTE — Progress Notes (Signed)
Pre visit review using our clinic review tool, if applicable. No additional management support is needed unless otherwise documented below in the visit note. 

## 2016-06-03 NOTE — Patient Instructions (Signed)
Follow up for any fever or increasing shortness of breath. 

## 2016-06-04 ENCOUNTER — Telehealth: Payer: Self-pay | Admitting: Family Medicine

## 2016-06-04 NOTE — Telephone Encounter (Signed)
° ° °  Pt said he was told to call back if his sinus was worse this morning and Dr Elease Hashimoto would call him in a antibiotic. He said he still has some Amoxicillin from Nov that [place] had RX him and ask if he could take that. Would like a call back

## 2016-06-04 NOTE — Telephone Encounter (Signed)
OK to start

## 2016-06-04 NOTE — Telephone Encounter (Signed)
Pt is aware of below msg.

## 2016-06-05 DIAGNOSIS — F411 Generalized anxiety disorder: Secondary | ICD-10-CM | POA: Diagnosis not present

## 2016-06-09 ENCOUNTER — Ambulatory Visit (INDEPENDENT_AMBULATORY_CARE_PROVIDER_SITE_OTHER): Payer: BLUE CROSS/BLUE SHIELD | Admitting: Family Medicine

## 2016-06-09 ENCOUNTER — Encounter: Payer: Self-pay | Admitting: Family Medicine

## 2016-06-09 VITALS — BP 102/70 | HR 83 | Temp 98.3°F | Wt 212.7 lb

## 2016-06-09 DIAGNOSIS — J069 Acute upper respiratory infection, unspecified: Secondary | ICD-10-CM

## 2016-06-09 NOTE — Progress Notes (Signed)
Pre visit review using our clinic review tool, if applicable. No additional management support is needed unless otherwise documented below in the visit note. 

## 2016-06-09 NOTE — Patient Instructions (Signed)
Follow up for any fever or increased shortness of breath Finish out the Augmentin

## 2016-06-09 NOTE — Progress Notes (Signed)
Subjective:     Patient ID: Peter Snyder, male   DOB: Jan 18, 1956, 61 y.o.   MRN: [id number]  HPI Patient seen with URI symptoms. He was seen here with some nasal congestion and cough with nonfocal exam we suspected viral. He called back and had actually started himself on some leftover Augmentin several days ago. Cough occasionally productive of yellow sputum. No fever. Some increased malaise. No dyspnea at rest. Denies any facial pain. No chest pains.  Past Medical History:  Diagnosis Date  . Arthritis   . CAD (coronary artery disease) 6/14   Cardiac catheterization 6/27/ 2014 ejection fraction 35-40%, 30% proximal left circumflex, 100% tiny obtuse marginal 1 with collaterals, 50% LAD, 50% D1, 100% RCA with collaterals  . Chicken pox   . Colon polyps   . Depression   . Diabetes mellitus ([place])    Diet controlled  . GERD (gastroesophageal reflux disease)   . Hyperlipidemia   . Hypertension   . Kidney stone    Past Surgical History:  Procedure Laterality Date  . TONSILLECTOMY  1963    reports that he has never smoked. He has never used smokeless tobacco. He reports that he does not drink alcohol or use drugs. family history includes Arthritis in his father and paternal grandmother; Heart disease in his maternal grandmother; Hyperlipidemia in his maternal grandmother. Allergies  Allergen Reactions  . Sulfa Antibiotics Rash     Review of Systems  Constitutional: Negative for chills and fever.  HENT: Positive for congestion.   Respiratory: Positive for cough. Negative for shortness of breath and wheezing.   Cardiovascular: Negative for chest pain.       Objective:   Physical Exam  Constitutional: He appears well-developed and well-nourished.  HENT:  Right Ear: External ear normal.  Left Ear: External ear normal.  Mouth/Throat: Oropharynx is clear and moist.  Cardiovascular: Normal rate and regular rhythm.   Pulmonary/Chest: Effort normal and breath sounds normal. No  respiratory distress. He has no wheezes. He has no rales.       Assessment:     URI with cough. Suspect viral. Patient has already started himself on some leftover Augmentin    Plan:     -Follow-up promptly for any fever or worsening symptoms --Out Augmentin which he started himself on a few days ago -Chest x-ray if cough not resolving over the next couple of weeks  [name] MD [facility] at [facility]

## 2016-06-11 ENCOUNTER — Telehealth: Payer: Self-pay | Admitting: Family Medicine

## 2016-06-11 DIAGNOSIS — Z713 Dietary counseling and surveillance: Secondary | ICD-10-CM | POA: Diagnosis not present

## 2016-06-11 MED ORDER — BENZONATATE 200 MG PO CAPS
200.0000 mg | ORAL_CAPSULE | Freq: Three times a day (TID) | ORAL | 0 refills | Status: DC | PRN
Start: 2016-06-11 — End: 2016-06-24

## 2016-06-11 NOTE — Telephone Encounter (Signed)
Rx sent 

## 2016-06-11 NOTE — Telephone Encounter (Signed)
Okay to fill? 

## 2016-06-11 NOTE — Telephone Encounter (Signed)
refill okay

## 2016-06-11 NOTE — Telephone Encounter (Signed)
Pt request refill  benzonatate (TESSALON) 200 MG capsule  Pt forgot to ask for a refill when he was here.   [id number] - [place], [place] [address]

## 2016-06-12 DIAGNOSIS — M5136 Other intervertebral disc degeneration, lumbar region: Secondary | ICD-10-CM | POA: Diagnosis not present

## 2016-06-12 DIAGNOSIS — L570 Actinic keratosis: Secondary | ICD-10-CM | POA: Diagnosis not present

## 2016-06-12 DIAGNOSIS — M25511 Pain in right shoulder: Secondary | ICD-10-CM | POA: Diagnosis not present

## 2016-06-12 DIAGNOSIS — L309 Dermatitis, unspecified: Secondary | ICD-10-CM | POA: Diagnosis not present

## 2016-06-12 DIAGNOSIS — M50922 Unspecified cervical disc disorder at C5-C6 level: Secondary | ICD-10-CM | POA: Diagnosis not present

## 2016-06-12 DIAGNOSIS — M50923 Unspecified cervical disc disorder at C6-C7 level: Secondary | ICD-10-CM | POA: Diagnosis not present

## 2016-06-16 ENCOUNTER — Other Ambulatory Visit: Payer: Self-pay | Admitting: *Deleted

## 2016-06-16 MED ORDER — TAMSULOSIN HCL 0.4 MG PO CAPS: 0.4000 mg | ORAL_CAPSULE | Freq: Every day | ORAL | 3 refills | Status: DC

## 2016-06-18 DIAGNOSIS — F411 Generalized anxiety disorder: Secondary | ICD-10-CM | POA: Diagnosis not present

## 2016-06-19 DIAGNOSIS — M50923 Unspecified cervical disc disorder at C6-C7 level: Secondary | ICD-10-CM | POA: Diagnosis not present

## 2016-06-19 DIAGNOSIS — M5136 Other intervertebral disc degeneration, lumbar region: Secondary | ICD-10-CM | POA: Diagnosis not present

## 2016-06-19 DIAGNOSIS — M25511 Pain in right shoulder: Secondary | ICD-10-CM | POA: Diagnosis not present

## 2016-06-19 DIAGNOSIS — M50922 Unspecified cervical disc disorder at C5-C6 level: Secondary | ICD-10-CM | POA: Diagnosis not present

## 2016-06-23 DIAGNOSIS — H9313 Tinnitus, bilateral: Secondary | ICD-10-CM | POA: Diagnosis not present

## 2016-06-23 DIAGNOSIS — H903 Sensorineural hearing loss, bilateral: Secondary | ICD-10-CM | POA: Diagnosis not present

## 2016-06-24 ENCOUNTER — Ambulatory Visit (INDEPENDENT_AMBULATORY_CARE_PROVIDER_SITE_OTHER): Payer: BLUE CROSS/BLUE SHIELD | Admitting: Family Medicine

## 2016-06-24 ENCOUNTER — Encounter: Payer: Self-pay | Admitting: Family Medicine

## 2016-06-24 VITALS — BP 108/70 | HR 71 | Temp 98.4°F | Wt 209.8 lb

## 2016-06-24 DIAGNOSIS — K120 Recurrent oral aphthae: Secondary | ICD-10-CM | POA: Diagnosis not present

## 2016-06-24 NOTE — Patient Instructions (Signed)
Canker Sores Canker sores are small, painful sores that develop inside your mouth. They may also be called aphthous ulcers. You can get canker sores on the inside of your lips or cheeks, on your tongue, or anywhere inside your mouth. You can have just one canker sore or several of them. Canker sores cannot be passed from one person to another (noncontagious). These sores are different than the sores that you may get on the outside of your lips (cold sores or fever blisters). Canker sores usually start as painful red bumps. Then they turn into small white, yellow, or gray ulcers that have red borders. The ulcers may be quite painful. The pain may be worse when you eat or drink. What are the causes? The cause of this condition is not known. What increases the risk? This condition is more likely to develop in:  Women.  People in their teens or 62s.  Women who are having their menstrual period.  People who are under a lot of emotional stress.  People who do not get enough iron or B vitamins.  People who have poor oral hygiene.  People who have an injury inside the mouth. This can happen after having dental work or from chewing something hard. What are the signs or symptoms? Along with the canker sore, symptoms may also include:  Fever.  Fatigue.  Swollen lymph nodes in your neck. How is this diagnosed? This condition can be diagnosed based on your symptoms. Your health care provider will also examine your mouth. Your health care provider may also do tests if you get canker sores often or if they are very bad. Tests may include:  Blood tests to rule out other causes of canker sores.  Taking swabs from the sore to check for infection.  Taking a small piece of skin from the sore (biopsy) to test it for cancer. How is this treated? Most canker sores clear up without treatment in about 10 days. Home care is usually the only treatment that you will need. Over-the-counter medicines can  relieve discomfort.If you have severe canker sores, your health care provider may prescribe:  Numbing ointment to relieve pain.  Vitamins.  Steroid medicines. These may be given as:  Oral pills.  Mouth rinses.  Gels.  Antibiotic mouth rinse. Follow these instructions at home:  Apply, take, or use medicines only as directed by your health care provider. These include vitamins.  If you were prescribed an antibiotic mouth rinse, finish all of it even if you start to feel better.  Until the sores are healed:  Do not drink coffee or citrus juices.  Do not eat spicy or salty foods.  Use a mild, over-the-counter mouth rinse as directed by your health care provider.  Practice good oral hygiene.  Floss your teeth every day.  Brush your teeth with a soft brush twice each day. Contact a health care provider if:  Your symptoms do not get better after two weeks.  You also have a fever or swollen glands.  You get canker sores often.  You have a canker sore that is getting larger.  You cannot eat or drink due to your canker sores. This information is not intended to replace advice given to you by your health care provider. Make sure you discuss any questions you have with your health care provider. Document Released: 06/28/2010 Document Revised: 08/09/2015 Document Reviewed: 02/01/2014 Elsevier Interactive Patient Education  2017 [employer].

## 2016-06-24 NOTE — Progress Notes (Signed)
Pre visit review using our clinic review tool, if applicable. No additional management support is needed unless otherwise documented below in the visit note. 

## 2016-06-24 NOTE — Progress Notes (Signed)
Subjective:     Patient ID: Peter Snyder, male   DOB: [birth year], 61 y.o.   MRN: [id number]  HPI Patient seen with a "spot "on right tonsillar area which was noted by ENT just yesterday. He went there for hearing evaluation and he was told to have this followed up closely. He's had some mild sore throat symptoms. No fevers or chills. Frequent postnasal drip symptoms. He has perennial allergies and is on chronic allergy medications. He also has history of recurrent aphthous ulcers. He has not noted any lymphadenopathy in the neck. He had some recent weight gain. No appetite loss. Never smoked.  Past Medical History:  Diagnosis Date  . Arthritis   . CAD (coronary artery disease) 6/14   Cardiac catheterization 6/27/ 2014 ejection fraction 35-40%, 30% proximal left circumflex, 100% tiny obtuse marginal 1 with collaterals, 50% LAD, 50% D1, 100% RCA with collaterals  . Chicken pox   . Colon polyps   . Depression   . Diabetes mellitus ([place])    Diet controlled  . GERD (gastroesophageal reflux disease)   . Hyperlipidemia   . Hypertension   . Kidney stone    Past Surgical History:  Procedure Laterality Date  . TONSILLECTOMY  1963    reports that he has never smoked. He has never used smokeless tobacco. He reports that he does not drink alcohol or use drugs. family history includes Arthritis in his father and paternal grandmother; Heart disease in his maternal grandmother; Hyperlipidemia in his maternal grandmother. Allergies  Allergen Reactions  . Sulfa Antibiotics Rash     Review of Systems  Constitutional: Negative for appetite change, chills, fever and unexpected weight change.  HENT: Positive for sore throat.   Hematological: Negative for adenopathy.       Objective:   Physical Exam  Constitutional: He appears well-developed and well-nourished.  HENT:  Small oval shaped aphthous type ulcer right anterior tonsillar pillar. No exudate  Neck: Neck supple.  Cardiovascular: Normal  rate and regular rhythm.   Pulmonary/Chest: Effort normal and breath sounds normal. No respiratory distress. He has no wheezes. He has no rales.  Lymphadenopathy:    He has no cervical adenopathy.       Assessment:     Small aphthous type ulcer right anterior tonsillar pillar    Plan:     Reassurance Recommend salt water gargles and reassess in 2 weeks to make sure this is healing  [name] MD [facility] at [facility]

## 2016-06-25 DIAGNOSIS — F411 Generalized anxiety disorder: Secondary | ICD-10-CM | POA: Diagnosis not present

## 2016-06-25 DIAGNOSIS — M65311 Trigger thumb, right thumb: Secondary | ICD-10-CM | POA: Diagnosis not present

## 2016-06-25 DIAGNOSIS — Z713 Dietary counseling and surveillance: Secondary | ICD-10-CM | POA: Diagnosis not present

## 2016-06-25 DIAGNOSIS — M65332 Trigger finger, left middle finger: Secondary | ICD-10-CM | POA: Diagnosis not present

## 2016-06-27 DIAGNOSIS — M50322 Other cervical disc degeneration at C5-C6 level: Secondary | ICD-10-CM | POA: Diagnosis not present

## 2016-06-27 DIAGNOSIS — M5136 Other intervertebral disc degeneration, lumbar region: Secondary | ICD-10-CM | POA: Diagnosis not present

## 2016-06-27 DIAGNOSIS — G894 Chronic pain syndrome: Secondary | ICD-10-CM | POA: Diagnosis not present

## 2016-07-01 DIAGNOSIS — M50923 Unspecified cervical disc disorder at C6-C7 level: Secondary | ICD-10-CM | POA: Diagnosis not present

## 2016-07-01 DIAGNOSIS — M5136 Other intervertebral disc degeneration, lumbar region: Secondary | ICD-10-CM | POA: Diagnosis not present

## 2016-07-01 DIAGNOSIS — M50922 Unspecified cervical disc disorder at C5-C6 level: Secondary | ICD-10-CM | POA: Diagnosis not present

## 2016-07-01 DIAGNOSIS — M25511 Pain in right shoulder: Secondary | ICD-10-CM | POA: Diagnosis not present

## 2016-07-05 ENCOUNTER — Other Ambulatory Visit: Payer: Self-pay | Admitting: Cardiology

## 2016-07-05 DIAGNOSIS — I255 Ischemic cardiomyopathy: Secondary | ICD-10-CM

## 2016-07-07 DIAGNOSIS — M25511 Pain in right shoulder: Secondary | ICD-10-CM | POA: Diagnosis not present

## 2016-07-07 DIAGNOSIS — M50922 Unspecified cervical disc disorder at C5-C6 level: Secondary | ICD-10-CM | POA: Diagnosis not present

## 2016-07-07 DIAGNOSIS — M50923 Unspecified cervical disc disorder at C6-C7 level: Secondary | ICD-10-CM | POA: Diagnosis not present

## 2016-07-07 DIAGNOSIS — M5136 Other intervertebral disc degeneration, lumbar region: Secondary | ICD-10-CM | POA: Diagnosis not present

## 2016-07-08 ENCOUNTER — Ambulatory Visit (INDEPENDENT_AMBULATORY_CARE_PROVIDER_SITE_OTHER): Payer: BLUE CROSS/BLUE SHIELD | Admitting: Family Medicine

## 2016-07-08 ENCOUNTER — Encounter: Payer: Self-pay | Admitting: Family Medicine

## 2016-07-08 VITALS — BP 112/78 | HR 70 | Temp 97.8°F | Wt 211.6 lb

## 2016-07-08 DIAGNOSIS — K120 Recurrent oral aphthae: Secondary | ICD-10-CM

## 2016-07-08 MED ORDER — DEXAMETHASONE 0.5 MG/5ML PO ELIX: 1.0000 mg | ORAL_SOLUTION | Freq: Four times a day (QID) | ORAL | 2 refills | Status: DC | PRN

## 2016-07-08 MED ORDER — GABAPENTIN (ONCE-DAILY) 600 MG PO TABS: 1.0000 | ORAL_TABLET | Freq: Two times a day (BID) | ORAL | 3 refills | Status: DC

## 2016-07-08 MED ORDER — GABAPENTIN 600 MG PO TABS: 600.0000 mg | ORAL_TABLET | Freq: Two times a day (BID) | ORAL | 1 refills | Status: DC

## 2016-07-08 NOTE — Patient Instructions (Signed)
WE NOW OFFER   [facility] FAST TRACK!!!  SAME DAY Appointments for ACUTE CARE  Such as: Sprains, Injuries, cuts, abrasions, rashes, muscle pain, joint pain, back pain Colds, flu, sore throats, headache, allergies, cough, fever  Ear pain, sinus and eye infections Abdominal pain, nausea, vomiting, diarrhea, upset stomach Animal/insect bites  3 Easy Ways to Schedule: Walk-In Scheduling Call in scheduling Mychart Sign-up: [email or web address]         

## 2016-07-08 NOTE — Progress Notes (Signed)
Subjective:     Patient ID: Peter Snyder, male   DOB: 1955-04-21, 61 y.o.   MRN: [id number]  HPI Patient seen for the following items  History of recurrent aphthous ulcers. He had a particularly larger ulcer 2 weeks ago right tonsillar region. He was told by ENT to have this followed up to ensure healing. He's been using some dexamethasone topical solution which seems to be helping. He form a subsequent ulcer on the right side of the tongue. Minimally painful. No difficulty swallowing. No adenopathy.  Patient is on gabapentin but currently taking 600 mg twice a day. Med list as 3 times a day. Requesting prescription refill. He was placed on this for atypical neuropathic type symptoms of severe itching in the past. This seems to help. Symptoms are currently controlled on twice a day dosing.  Past Medical History:  Diagnosis Date  . Arthritis   . CAD (coronary artery disease) 6/14   Cardiac catheterization 6/27/ 2014 ejection fraction 35-40%, 30% proximal left circumflex, 100% tiny obtuse marginal 1 with collaterals, 50% LAD, 50% D1, 100% RCA with collaterals  . Chicken pox   . Colon polyps   . Depression   . Diabetes mellitus ([place])    Diet controlled  . GERD (gastroesophageal reflux disease)   . Hyperlipidemia   . Hypertension   . Kidney stone    Past Surgical History:  Procedure Laterality Date  . TONSILLECTOMY  1963    reports that he has never smoked. He has never used smokeless tobacco. He reports that he does not drink alcohol or use drugs. family history includes Arthritis in his father and paternal grandmother; Heart disease in his maternal grandmother; Hyperlipidemia in his maternal grandmother. Allergies  Allergen Reactions  . Sulfa Antibiotics Rash     Review of Systems  Constitutional: Negative for chills and fever.  HENT: Negative for sore throat.   Hematological: Negative for adenopathy.       Objective:   Physical Exam  Constitutional: He appears  well-developed and well-nourished.  HENT:  Right Ear: External ear normal.  Left Ear: External ear normal.  Very small aphthous ulcer right lateral tongue. Previously noted aphthous ulcer right tonsillar region has fully cleared  Cardiovascular: Normal rate and regular rhythm.  Exam reveals no gallop.   Pulmonary/Chest: Effort normal and breath sounds normal. No respiratory distress. He has no wheezes. He has no rales.       Assessment:     Recurrent aphthous ulcers. Improving  History of polyneuropathy    Plan:     -Continue saltwater gargles and oral dexamethasone suspension as needed -We discussed medications for possible prevention and he is not interested at this time -Refill gabapentin 600 milligrams twice a day  [name] MD [facility]

## 2016-07-10 ENCOUNTER — Ambulatory Visit: Payer: BLUE CROSS/BLUE SHIELD | Admitting: Adult Health

## 2016-07-10 ENCOUNTER — Encounter: Payer: Self-pay | Admitting: Adult Health

## 2016-07-10 ENCOUNTER — Ambulatory Visit (INDEPENDENT_AMBULATORY_CARE_PROVIDER_SITE_OTHER): Payer: BLUE CROSS/BLUE SHIELD | Admitting: Adult Health

## 2016-07-10 VITALS — BP 104/60 | Temp 98.3°F | Wt 211.6 lb

## 2016-07-10 DIAGNOSIS — M6740 Ganglion, unspecified site: Secondary | ICD-10-CM | POA: Diagnosis not present

## 2016-07-10 NOTE — Patient Instructions (Signed)
WE NOW OFFER   [facility] FAST TRACK!!!  SAME DAY Appointments for ACUTE CARE  Such as: Sprains, Injuries, cuts, abrasions, rashes, muscle pain, joint pain, back pain Colds, flu, sore throats, headache, allergies, cough, fever  Ear pain, sinus and eye infections Abdominal pain, nausea, vomiting, diarrhea, upset stomach Animal/insect bites  3 Easy Ways to Schedule: Walk-In Scheduling Call in scheduling Mychart Sign-up: [email or web address]         

## 2016-07-10 NOTE — Progress Notes (Signed)
Subjective:    Patient ID: Peter Snyder, male    DOB: [birth year], 61 y.o.   MRN: [id number]  HPI 61 year old male who  has a past medical history of Arthritis; CAD (coronary artery disease) (6/14); Chicken pox; Colon polyps; Depression; Diabetes mellitus ([place]); GERD (gastroesophageal reflux disease); Hyperlipidemia; Hypertension; and Kidney stone.  He presents to the office today for concern of a cyst on his right middle finger that " just popped up a day or two ago".   He reports that the cyst is painful to touch. Has redness  Review of Systems See HPI   Past Medical History:  Diagnosis Date  . Arthritis   . CAD (coronary artery disease) 6/14   Cardiac catheterization 6/27/ 2014 ejection fraction 35-40%, 30% proximal left circumflex, 100% tiny obtuse marginal 1 with collaterals, 50% LAD, 50% D1, 100% RCA with collaterals  . Chicken pox   . Colon polyps   . Depression   . Diabetes mellitus ([place])    Diet controlled  . GERD (gastroesophageal reflux disease)   . Hyperlipidemia   . Hypertension   . Kidney stone     Social History   Social History  . Marital status: Married    Spouse name: N/A  . Number of children: 3  . Years of education: N/A   Occupational History  .      Retired   Social History Main Topics  . Smoking status: Never Smoker  . Smokeless tobacco: Never Used  . Alcohol use No  . Drug use: No  . Sexual activity: Not on file   Other Topics Concern  . Not on file   Social History Narrative  . No narrative on file    Past Surgical History:  Procedure Laterality Date  . TONSILLECTOMY  1963    Family History  Problem Relation Age of Onset  . Arthritis Father   . Hyperlipidemia Maternal Grandmother   . Heart disease Maternal Grandmother   . Arthritis Paternal Grandmother     Allergies  Allergen Reactions  . Sulfa Antibiotics Rash    Current Outpatient Prescriptions on File Prior to Visit  Medication Sig Dispense Refill  .  acetaminophen (TYLENOL) 500 MG tablet Take 500 mg by mouth every 6 (six) hours as needed for moderate pain.    Marland Kitchen ALPRAZolam (XANAX) 0.25 MG tablet Take 0.25 mg by mouth daily.     Marland Kitchen aspirin 81 MG tablet Take 81 mg by mouth daily.    Marland Kitchen atorvastatin (LIPITOR) 80 MG tablet TAKE 1 TABLET BY MOUTH EVERY DAY 90 tablet 3  . AZASITE 1 % ophthalmic solution INSTILL 1 DROP INTO AFFECTED EYE AT BEDTIME  4  . Bismuth Tribromoph-Petrolatum (XEROFORM PETROLAT PATCH 4"X4") PADS USE AS DIRECTED TWICE A DAY EXTERNALLY 30 DAYS  4  . carvedilol (COREG) 12.5 MG tablet Take 1 tablet (12.5 mg total) by mouth 2 (two) times daily with a meal. 180 tablet 3  . celecoxib (CELEBREX) 200 MG capsule Take 200 mg by mouth daily.     . clonazePAM (KLONOPIN) 0.5 MG tablet Take 0.5 mg by mouth at bedtime.     . Dermatological Products, Misc. (TETRIX) CREA Apply topically See admin instructions.  3  . dexamethasone 0.5 MG/5ML elixir Take 10 mLs (1 mg total) by mouth 4 (four) times daily as needed. 237 mL 2  . ELIDEL 1 % cream APPLY TO AFFECTED AREA TWICE A DAY AS NEEDED FOR 14 DAYS  2  . escitalopram (LEXAPRO)  20 MG tablet Take 20 mg by mouth daily.    Marland Kitchen ezetimibe (ZETIA) 10 MG tablet Take 1 tablet (10 mg total) by mouth daily. 90 tablet 1  . fluticasone (FLONASE) 50 MCG/ACT nasal spray Place 2 sprays into both nostrils daily. 16 g 6  . gabapentin (NEURONTIN) 600 MG tablet Take 1 tablet (600 mg total) by mouth 2 (two) times daily. 180 tablet 1  . lisinopril (PRINIVIL,ZESTRIL) 20 MG tablet Take 20 mg by mouth daily.    Marland Kitchen LOTEMAX 0.5 % GEL     . pantoprazole (PROTONIX) 40 MG tablet TAKE 1 TABLET (40 MG TOTAL) BY MOUTH DAILY. 90 tablet 0  . RESTASIS 0.05 % ophthalmic emulsion Place 1 drop into both eyes 2 (two) times daily.     . tacrolimus (PROTOPIC) 0.1 % ointment     . tamsulosin (FLOMAX) 0.4 MG CAPS capsule Take 1 capsule (0.4 mg total) by mouth daily after supper. 90 capsule 3  . urea (CARMOL) 10 % cream APPLY TO AFFECTED AREA  TWICE A DAY AS NEEDED  3  . VOLTAREN 1 % GEL Apply 2 g topically daily.     Marland Kitchen zolpidem (AMBIEN) 10 MG tablet Take 10 mg by mouth at bedtime as needed for sleep.     No current facility-administered medications on file prior to visit.     BP 104/60 (BP Location: Right Arm, Patient Position: Sitting, Cuff Size: Normal)   Temp 98.3 F (36.8 C) (Oral)   Wt 211 lb 9.6 oz (96 kg)   BMI 27.92 kg/m       Objective:   Physical Exam  Constitutional: He is oriented to person, place, and time. He appears well-developed and well-nourished. No distress.  Pulmonary/Chest: Effort normal and breath sounds normal.  Neurological: He is alert and oriented to person, place, and time.  Skin: Skin is warm. No rash noted. He is not diaphoretic. No erythema. No pallor.  Ganglion cyst on DIP of right middle finger. Localized redness noted   Nursing note and vitals reviewed.     Assessment & Plan:  1. Ganglion cyst - Conservative treatment.  - He has an appointment with his hand surgeon in two weeks .  Dorothyann Peng, NP

## 2016-07-16 ENCOUNTER — Other Ambulatory Visit: Payer: Self-pay | Admitting: Family Medicine

## 2016-07-16 ENCOUNTER — Encounter: Payer: Self-pay | Admitting: *Deleted

## 2016-07-17 DIAGNOSIS — M67441 Ganglion, right hand: Secondary | ICD-10-CM | POA: Diagnosis not present

## 2016-07-17 DIAGNOSIS — L570 Actinic keratosis: Secondary | ICD-10-CM | POA: Diagnosis not present

## 2016-07-17 DIAGNOSIS — L309 Dermatitis, unspecified: Secondary | ICD-10-CM | POA: Diagnosis not present

## 2016-07-17 DIAGNOSIS — F411 Generalized anxiety disorder: Secondary | ICD-10-CM | POA: Diagnosis not present

## 2016-07-17 DIAGNOSIS — D485 Neoplasm of uncertain behavior of skin: Secondary | ICD-10-CM | POA: Diagnosis not present

## 2016-07-17 DIAGNOSIS — L700 Acne vulgaris: Secondary | ICD-10-CM | POA: Diagnosis not present

## 2016-07-19 DIAGNOSIS — M545 Low back pain: Secondary | ICD-10-CM | POA: Diagnosis not present

## 2016-07-23 DIAGNOSIS — M65312 Trigger thumb, left thumb: Secondary | ICD-10-CM | POA: Diagnosis not present

## 2016-07-23 DIAGNOSIS — M79642 Pain in left hand: Secondary | ICD-10-CM | POA: Diagnosis not present

## 2016-07-23 DIAGNOSIS — Z713 Dietary counseling and surveillance: Secondary | ICD-10-CM | POA: Diagnosis not present

## 2016-07-23 DIAGNOSIS — M79641 Pain in right hand: Secondary | ICD-10-CM | POA: Diagnosis not present

## 2016-07-24 ENCOUNTER — Other Ambulatory Visit: Payer: Self-pay | Admitting: Cardiology

## 2016-07-24 DIAGNOSIS — M25511 Pain in right shoulder: Secondary | ICD-10-CM | POA: Diagnosis not present

## 2016-07-24 DIAGNOSIS — M5136 Other intervertebral disc degeneration, lumbar region: Secondary | ICD-10-CM | POA: Diagnosis not present

## 2016-07-24 DIAGNOSIS — M50923 Unspecified cervical disc disorder at C6-C7 level: Secondary | ICD-10-CM | POA: Diagnosis not present

## 2016-07-24 DIAGNOSIS — M50922 Unspecified cervical disc disorder at C5-C6 level: Secondary | ICD-10-CM | POA: Diagnosis not present

## 2016-07-28 NOTE — Progress Notes (Signed)
HPI: FU coronary artery disease. Previous cardiac care in [place]. Cardiac catheterization performed in June of 2014 showed an ejection fraction of 35-40%. The inferior wall is akinetic. The left main was normal. There was a 30% proximal circumflex, occluded small first obtuse marginal with collaterals, a 50% ramus intermedius, 50% LAD, 50% first diagonal and an occluded right coronary artery with collaterals. Treated medically. Nuclear study January 2017 showed ejection fraction 35%. There was a prior inferior infarct with mild peri-infarct ischemia. Echocardiogram In January 2017 interpreted normal LV systolic function, trace aortic insufficiency mild mitral regurgitation and mild left atrial enlargement. I previously reviewed the echocardiogram and there appearsed to be akinesis of the inferior and basal inferior lateral wall. Ejection fraction in the 35-40% range. Cardiac MRI May 2017 showed akinetic inferior wall with ejection fraction 40%. Biatrial enlargement and mild aortic/mitral regurgitation. MRA March 2018 showed mildly dilated aortic root measuring 3.9 cm. Since last seen, the patient denies any dyspnea on exertion, orthopnea, PND, pedal edema, palpitations, syncope or chest pain.   Current Outpatient Prescriptions  Medication Sig Dispense Refill  . acetaminophen (TYLENOL) 500 MG tablet Take 500 mg by mouth every 6 (six) hours as needed for moderate pain.    Marland Kitchen ALPRAZolam (XANAX) 0.25 MG tablet Take 0.25 mg by mouth daily.     Marland Kitchen aspirin 81 MG tablet Take 81 mg by mouth daily.    Marland Kitchen atorvastatin (LIPITOR) 80 MG tablet TAKE 1 TABLET BY MOUTH EVERY DAY 90 tablet 3  . AZASITE 1 % ophthalmic solution INSTILL 1 DROP INTO AFFECTED EYE AT BEDTIME  4  . Bismuth Tribromoph-Petrolatum (XEROFORM PETROLAT PATCH 4"X4") PADS USE AS DIRECTED TWICE A DAY EXTERNALLY 30 DAYS  4  . carvedilol (COREG) 12.5 MG tablet TAKE 1 TABLET (12.5 MG TOTAL) BY MOUTH 2 (TWO) TIMES DAILY WITH A MEAL. 180 tablet 1  .  celecoxib (CELEBREX) 200 MG capsule Take 200 mg by mouth daily.     . clonazePAM (KLONOPIN) 0.5 MG tablet Take 0.5 mg by mouth at bedtime.     . Dermatological Products, Misc. (TETRIX) CREA Apply topically See admin instructions.  3  . dexamethasone 0.5 MG/5ML elixir Take 10 mLs (1 mg total) by mouth 4 (four) times daily as needed. 237 mL 2  . ELIDEL 1 % cream APPLY TO AFFECTED AREA TWICE A DAY AS NEEDED FOR 14 DAYS  2  . escitalopram (LEXAPRO) 20 MG tablet Take 20 mg by mouth daily.    Marland Kitchen ezetimibe (ZETIA) 10 MG tablet TAKE 1 TABLET (10 MG TOTAL) BY MOUTH DAILY. 90 tablet 1  . fluticasone (FLONASE) 50 MCG/ACT nasal spray Place 2 sprays into both nostrils daily. 16 g 6  . gabapentin (NEURONTIN) 600 MG tablet Take 1 tablet (600 mg total) by mouth 2 (two) times daily. 180 tablet 1  . lisinopril (PRINIVIL,ZESTRIL) 20 MG tablet Take 20 mg by mouth daily.    Marland Kitchen LOTEMAX 0.5 % GEL     . pantoprazole (PROTONIX) 40 MG tablet TAKE 1 TABLET (40 MG TOTAL) BY MOUTH DAILY. 90 tablet 0  . RESTASIS 0.05 % ophthalmic emulsion Place 1 drop into both eyes 2 (two) times daily.     . tacrolimus (PROTOPIC) 0.1 % ointment     . tamsulosin (FLOMAX) 0.4 MG CAPS capsule Take 1 capsule (0.4 mg total) by mouth daily after supper. 90 capsule 3  . urea (CARMOL) 10 % cream APPLY TO AFFECTED AREA TWICE A DAY AS NEEDED  3  .  VOLTAREN 1 % GEL Apply 2 g topically daily.     Marland Kitchen zolpidem (AMBIEN) 10 MG tablet Take 10 mg by mouth at bedtime as needed for sleep.     No current facility-administered medications for this visit.      Past Medical History:  Diagnosis Date  . Arthritis   . CAD (coronary artery disease) 6/14   Cardiac catheterization 6/27/ 2014 ejection fraction 35-40%, 30% proximal left circumflex, 100% tiny obtuse marginal 1 with collaterals, 50% LAD, 50% D1, 100% RCA with collaterals  . Chicken pox   . Colon polyps   . Depression   . Diabetes mellitus ([place])    Diet controlled  . GERD (gastroesophageal reflux  disease)   . Hyperlipidemia   . Hypertension   . Kidney stone     Past Surgical History:  Procedure Laterality Date  . TONSILLECTOMY  1963    Social History   Social History  . Marital status: Married    Spouse name: N/A  . Number of children: 3  . Years of education: N/A   Occupational History  .      Retired   Social History Main Topics  . Smoking status: Never Smoker  . Smokeless tobacco: Never Used  . Alcohol use No  . Drug use: No  . Sexual activity: Not on file   Other Topics Concern  . Not on file   Social History Narrative  . No narrative on file    Family History  Problem Relation Age of Onset  . Arthritis Father   . Hyperlipidemia Maternal Grandmother   . Heart disease Maternal Grandmother   . Arthritis Paternal Grandmother     ROS: Low back pain but no fevers or chills, productive cough, hemoptysis, dysphasia, odynophagia, melena, hematochezia, dysuria, hematuria, rash, seizure activity, orthopnea, PND, pedal edema, claudication. Remaining systems are negative.  Physical Exam: Well-developed well-nourished in no acute distress.  Skin is warm and dry.  HEENT is normal.  Neck is supple.  Chest is clear to auscultation with normal expansion.  Cardiovascular exam is regular rate and rhythm.  Abdominal exam nontender or distended. No masses palpated. Extremities show no edema. neuro grossly intact  ECG- Normal sinus rhythm at a rate of 68. Inferior posterior infarct. personally reviewed  A/P  1 Coronary artery disease-continue aspirin and statin.  2 ischemic cardiomyopathy-continue ACE inhibitor and beta blocker. Laboratories January 2018 personally reviewed. Creatinine normal. Potassium 4.5.  3 hyperlipidemia-continue statin and Zetia. Laboratories January 2018 personally reviewed. Direct LDL 70. Liver functions normal.   4 hypertension-blood pressure controlled. Continue present medications.   5 dilated aortic root-plan repeat MRA March  2019.  Kirk Ruths, MD

## 2016-07-30 ENCOUNTER — Telehealth: Payer: Self-pay | Admitting: Cardiology

## 2016-07-30 DIAGNOSIS — M25511 Pain in right shoulder: Secondary | ICD-10-CM | POA: Diagnosis not present

## 2016-07-30 DIAGNOSIS — M50922 Unspecified cervical disc disorder at C5-C6 level: Secondary | ICD-10-CM | POA: Diagnosis not present

## 2016-07-30 DIAGNOSIS — F411 Generalized anxiety disorder: Secondary | ICD-10-CM | POA: Diagnosis not present

## 2016-07-30 DIAGNOSIS — M5136 Other intervertebral disc degeneration, lumbar region: Secondary | ICD-10-CM | POA: Diagnosis not present

## 2016-07-30 DIAGNOSIS — M50923 Unspecified cervical disc disorder at C6-C7 level: Secondary | ICD-10-CM | POA: Diagnosis not present

## 2016-07-30 NOTE — Telephone Encounter (Signed)
Received notes from [facility] for appointment on 08/04/16 with Dr Stanford Breed.  Records put with Dr Jacalyn Lefevre schedule for 08/04/16. lp

## 2016-08-03 ENCOUNTER — Other Ambulatory Visit: Payer: Self-pay | Admitting: Family Medicine

## 2016-08-04 ENCOUNTER — Ambulatory Visit (INDEPENDENT_AMBULATORY_CARE_PROVIDER_SITE_OTHER): Payer: BLUE CROSS/BLUE SHIELD | Admitting: Cardiology

## 2016-08-04 ENCOUNTER — Encounter: Payer: Self-pay | Admitting: Cardiology

## 2016-08-04 VITALS — BP 115/74 | HR 68 | Ht 73.0 in | Wt 210.0 lb

## 2016-08-04 DIAGNOSIS — I712 Thoracic aortic aneurysm, without rupture, unspecified: Secondary | ICD-10-CM

## 2016-08-04 DIAGNOSIS — I2510 Atherosclerotic heart disease of native coronary artery without angina pectoris: Secondary | ICD-10-CM

## 2016-08-04 DIAGNOSIS — I10 Essential (primary) hypertension: Secondary | ICD-10-CM | POA: Diagnosis not present

## 2016-08-04 DIAGNOSIS — E7800 Pure hypercholesterolemia, unspecified: Secondary | ICD-10-CM | POA: Diagnosis not present

## 2016-08-04 DIAGNOSIS — I255 Ischemic cardiomyopathy: Secondary | ICD-10-CM | POA: Diagnosis not present

## 2016-08-04 NOTE — Patient Instructions (Signed)
Your physician wants you to follow-up in: 6 MONTHS WITH DR CRENSHAW You will receive a reminder letter in the mail two months in advance. If you don't receive a letter, please call our office to schedule the follow-up appointment.   If you need a refill on your cardiac medications before your next appointment, please call your pharmacy.  

## 2016-08-06 DIAGNOSIS — F411 Generalized anxiety disorder: Secondary | ICD-10-CM | POA: Diagnosis not present

## 2016-08-07 DIAGNOSIS — M5136 Other intervertebral disc degeneration, lumbar region: Secondary | ICD-10-CM | POA: Diagnosis not present

## 2016-08-07 DIAGNOSIS — M50922 Unspecified cervical disc disorder at C5-C6 level: Secondary | ICD-10-CM | POA: Diagnosis not present

## 2016-08-07 DIAGNOSIS — M25511 Pain in right shoulder: Secondary | ICD-10-CM | POA: Diagnosis not present

## 2016-08-07 DIAGNOSIS — M50923 Unspecified cervical disc disorder at C6-C7 level: Secondary | ICD-10-CM | POA: Diagnosis not present

## 2016-08-12 DIAGNOSIS — Z713 Dietary counseling and surveillance: Secondary | ICD-10-CM | POA: Diagnosis not present

## 2016-08-12 DIAGNOSIS — M25511 Pain in right shoulder: Secondary | ICD-10-CM | POA: Diagnosis not present

## 2016-08-12 DIAGNOSIS — M5136 Other intervertebral disc degeneration, lumbar region: Secondary | ICD-10-CM | POA: Diagnosis not present

## 2016-08-12 DIAGNOSIS — M50923 Unspecified cervical disc disorder at C6-C7 level: Secondary | ICD-10-CM | POA: Diagnosis not present

## 2016-08-12 DIAGNOSIS — M50922 Unspecified cervical disc disorder at C5-C6 level: Secondary | ICD-10-CM | POA: Diagnosis not present

## 2016-08-20 DIAGNOSIS — M65311 Trigger thumb, right thumb: Secondary | ICD-10-CM | POA: Diagnosis not present

## 2016-08-20 DIAGNOSIS — F411 Generalized anxiety disorder: Secondary | ICD-10-CM | POA: Diagnosis not present

## 2016-08-20 DIAGNOSIS — M65332 Trigger finger, left middle finger: Secondary | ICD-10-CM | POA: Diagnosis not present

## 2016-08-20 DIAGNOSIS — M50923 Unspecified cervical disc disorder at C6-C7 level: Secondary | ICD-10-CM | POA: Diagnosis not present

## 2016-08-20 DIAGNOSIS — M50922 Unspecified cervical disc disorder at C5-C6 level: Secondary | ICD-10-CM | POA: Diagnosis not present

## 2016-08-20 DIAGNOSIS — M5136 Other intervertebral disc degeneration, lumbar region: Secondary | ICD-10-CM | POA: Diagnosis not present

## 2016-08-20 DIAGNOSIS — M65331 Trigger finger, right middle finger: Secondary | ICD-10-CM | POA: Diagnosis not present

## 2016-08-20 DIAGNOSIS — M25511 Pain in right shoulder: Secondary | ICD-10-CM | POA: Diagnosis not present

## 2016-08-20 DIAGNOSIS — M65312 Trigger thumb, left thumb: Secondary | ICD-10-CM | POA: Diagnosis not present

## 2016-08-21 ENCOUNTER — Ambulatory Visit (INDEPENDENT_AMBULATORY_CARE_PROVIDER_SITE_OTHER): Payer: BLUE CROSS/BLUE SHIELD | Admitting: Adult Health

## 2016-08-21 VITALS — BP 102/64 | HR 66 | Temp 97.9°F | Wt 212.0 lb

## 2016-08-21 DIAGNOSIS — J069 Acute upper respiratory infection, unspecified: Secondary | ICD-10-CM

## 2016-08-21 MED ORDER — BENZONATATE 200 MG PO CAPS: 200.0000 mg | ORAL_CAPSULE | Freq: Two times a day (BID) | ORAL | 1 refills | Status: DC | PRN

## 2016-08-21 NOTE — Progress Notes (Signed)
   Subjective:    Patient ID: Peter Snyder, male    DOB: 1956/02/19, 61 y.o.   MRN: [id number]  URI   This is a recurrent problem. The current episode started yesterday. The problem has been unchanged. There has been no fever. Associated symptoms include congestion, coughing (dry ), headaches and sinus pain. Pertinent negatives include no chest pain, diarrhea, ear pain, nausea, plugged ear sensation, rhinorrhea or sore throat. Treatments tried: Flonase. The treatment provided no relief.    61 year old male who  has a past medical history of Arthritis; CAD (coronary artery disease) (6/14); Chicken pox; Colon polyps; Depression; Diabetes mellitus ([place]); GERD (gastroesophageal reflux disease); Hyperlipidemia; Hypertension; and Kidney stone.    Review of Systems  Constitutional: Positive for fatigue.  HENT: Positive for congestion and sinus pain. Negative for ear pain, rhinorrhea and sore throat.   Respiratory: Positive for cough (dry ) and chest tightness. Negative for shortness of breath.   Cardiovascular: Negative for chest pain.  Gastrointestinal: Negative for diarrhea and nausea.  Neurological: Positive for headaches.       Objective:   Physical Exam  Constitutional: He is oriented to person, place, and time. He appears well-developed and well-nourished. No distress.  HENT:  Head: Normocephalic and atraumatic.  Right Ear: Hearing, tympanic membrane, external ear and ear canal normal.  Left Ear: Hearing, tympanic membrane, external ear and ear canal normal.  Nose: No mucosal edema or rhinorrhea. Right sinus exhibits frontal sinus tenderness. Right sinus exhibits no maxillary sinus tenderness. Left sinus exhibits frontal sinus tenderness. Left sinus exhibits no maxillary sinus tenderness.  Mouth/Throat: Uvula is midline, oropharynx is clear and moist and mucous membranes are normal. No oropharyngeal exudate.  Eyes: Conjunctivae and EOM are normal. Pupils are equal, round, and reactive to  light. Right eye exhibits no discharge. Left eye exhibits no discharge. No scleral icterus.  Neck: Normal range of motion. Neck supple. No thyromegaly present.  Cardiovascular: Normal rate, regular rhythm, normal heart sounds and intact distal pulses.  Exam reveals no gallop and no friction rub.   No murmur heard. Pulmonary/Chest: Effort normal and breath sounds normal. No respiratory distress. He has no wheezes. He has no rales. He exhibits no tenderness.  Lymphadenopathy:       Head (right side): Submandibular adenopathy present.       Head (left side): Submandibular adenopathy present.    He has cervical adenopathy.  Neurological: He is alert and oriented to person, place, and time.  Skin: Skin is warm and dry. No rash noted. He is not diaphoretic. No erythema. No pallor.  Psychiatric: He has a normal mood and affect. His behavior is normal. Judgment and thought content normal.  Nursing note and vitals reviewed.     Assessment & Plan:  1. Upper respiratory tract infection, unspecified type - Viral or allergy. His symptoms have been present for less than 24 hours.  - benzonatate (TESSALON) 200 MG capsule; Take 1 capsule (200 mg total) by mouth 2 (two) times daily as needed for cough.  Dispense: 20 capsule; Refill: 1 - Continue Flonase - Can use Afrin but no longer than 3 days - Follow up in one week if no improvement   Dorothyann Peng, NP

## 2016-08-26 ENCOUNTER — Encounter: Payer: Self-pay | Admitting: Family Medicine

## 2016-08-26 ENCOUNTER — Ambulatory Visit (INDEPENDENT_AMBULATORY_CARE_PROVIDER_SITE_OTHER): Payer: BLUE CROSS/BLUE SHIELD | Admitting: Family Medicine

## 2016-08-26 VITALS — BP 102/62 | HR 71 | Temp 98.1°F | Wt 213.2 lb

## 2016-08-26 DIAGNOSIS — Z713 Dietary counseling and surveillance: Secondary | ICD-10-CM | POA: Diagnosis not present

## 2016-08-26 DIAGNOSIS — R0981 Nasal congestion: Secondary | ICD-10-CM | POA: Diagnosis not present

## 2016-08-26 DIAGNOSIS — L299 Pruritus, unspecified: Secondary | ICD-10-CM | POA: Diagnosis not present

## 2016-08-26 NOTE — Progress Notes (Signed)
Subjective:     Patient ID: Peter Snyder, male   DOB: 05/31/1955, 61 y.o.   MRN: [id number]  HPI Patient seen with some persistent sinus congestion. He was seen last Thursday felt to have viral URI. Prescribed Tessalon for cough. Cough is minimal. He has some congestion which is worse at night. Denies any facial pain. Occasional bifrontal headache. No fevers or chills. No purulent secretions. Occasional sore throat.  Uses nasal steroid spray at baseline.  Patient complains of separate problem of left anterior thigh pruritus. No rash. Itching is intermittent. He had seen someone previously and felt to have possible neuropathic type itching.  already takes gabapentin twice daily. Cannot take 3 times a day secondary to sedation fatigue. No exacerbating factors.  Past Medical History:  Diagnosis Date  . Arthritis   . CAD (coronary artery disease) 6/14   Cardiac catheterization 6/27/ 2014 ejection fraction 35-40%, 30% proximal left circumflex, 100% tiny obtuse marginal 1 with collaterals, 50% LAD, 50% D1, 100% RCA with collaterals  . Chicken pox   . Colon polyps   . Depression   . Diabetes mellitus ([place])    Diet controlled  . GERD (gastroesophageal reflux disease)   . Hyperlipidemia   . Hypertension   . Kidney stone    Past Surgical History:  Procedure Laterality Date  . TONSILLECTOMY  1963    reports that he has never smoked. He has never used smokeless tobacco. He reports that he does not drink alcohol or use drugs. family history includes Arthritis in his father and paternal grandmother; Heart disease in his maternal grandmother; Hyperlipidemia in his maternal grandmother. Allergies  Allergen Reactions  . Sulfa Antibiotics Rash     Review of Systems  Constitutional: Negative for chills and fever.  HENT: Positive for congestion, sinus pain and sinus pressure.   Cardiovascular: Negative for chest pain.       Objective:   Physical Exam  Constitutional: He appears well-developed  and well-nourished.  HENT:  Right Ear: External ear normal.  Left Ear: External ear normal.  Mouth/Throat: Oropharynx is clear and moist.  Neck: Neck supple.  Cardiovascular: Normal rate and regular rhythm.   Pulmonary/Chest: Effort normal and breath sounds normal. No respiratory distress. He has no wheezes. He has no rales.  Lymphadenopathy:    He has no cervical adenopathy.       Assessment:     #1 nasal congestion. Differential is resolving viral process versus allergies. Doubt acute bacterial sinusitis  #2 possible neuropathic mediated itching/pruritus left anterior thigh    Plan:     -Continue with nasal steroid spray -Increase hydration -Follow-up for any fever or purulent nasal secretions -Consider trial of topical Benadryl to pruritis left anterior thigh  Peter Post MD [facility] at [facility]

## 2016-08-26 NOTE — Patient Instructions (Signed)
Try some topical benadryl for left thigh itch.

## 2016-08-27 DIAGNOSIS — M5136 Other intervertebral disc degeneration, lumbar region: Secondary | ICD-10-CM | POA: Diagnosis not present

## 2016-08-27 DIAGNOSIS — M50922 Unspecified cervical disc disorder at C5-C6 level: Secondary | ICD-10-CM | POA: Diagnosis not present

## 2016-08-27 DIAGNOSIS — M50923 Unspecified cervical disc disorder at C6-C7 level: Secondary | ICD-10-CM | POA: Diagnosis not present

## 2016-08-27 DIAGNOSIS — M25511 Pain in right shoulder: Secondary | ICD-10-CM | POA: Diagnosis not present

## 2016-09-01 ENCOUNTER — Encounter: Payer: Self-pay | Admitting: Family Medicine

## 2016-09-01 ENCOUNTER — Ambulatory Visit (INDEPENDENT_AMBULATORY_CARE_PROVIDER_SITE_OTHER): Payer: BLUE CROSS/BLUE SHIELD | Admitting: Family Medicine

## 2016-09-01 VITALS — BP 110/70 | HR 70 | Temp 98.6°F | Wt 210.6 lb

## 2016-09-01 DIAGNOSIS — K649 Unspecified hemorrhoids: Secondary | ICD-10-CM

## 2016-09-01 NOTE — Patient Instructions (Signed)
Confirm date of last colonoscopy and recommended date of follow up Consider OTC Anusol HC or Preparation H topically Continue with good hydration and plenty of fluid intake and fiber No need to scale back your walking.

## 2016-09-01 NOTE — Progress Notes (Signed)
Subjective:     Patient ID: Peter Snyder, male   DOB: 1955/06/06, 61 y.o.   MRN: [id number]  HPI Patient is seen with one episode of bright red blood per rectum when wiping yesterday morning. He has had no episodes since then including one other bowel movement yesterday as well as an episode this morning with no blood. He reportedly had colonoscopy 2013 when he lived in [place]. We cannot locate report of that study. He was told he had 2 benign polyps and for five-year follow-up. He thinks five-year follow-up will occur this fall.  He noticed low blood mild pain with wiping yesterday. He thinks he had remote anal fissure years ago. No recent constipation. No abdominal pain. No melena. He has not noted any recent blood in his stools  Past Medical History:  Diagnosis Date  . Arthritis   . CAD (coronary artery disease) 6/14   Cardiac catheterization 6/27/ 2014 ejection fraction 35-40%, 30% proximal left circumflex, 100% tiny obtuse marginal 1 with collaterals, 50% LAD, 50% D1, 100% RCA with collaterals  . Chicken pox   . Colon polyps   . Depression   . Diabetes mellitus ([place])    Diet controlled  . GERD (gastroesophageal reflux disease)   . Hyperlipidemia   . Hypertension   . Kidney stone    Past Surgical History:  Procedure Laterality Date  . TONSILLECTOMY  1963    reports that he has never smoked. He has never used smokeless tobacco. He reports that he does not drink alcohol or use drugs. family history includes Arthritis in his father and paternal grandmother; Heart disease in his maternal grandmother; Hyperlipidemia in his maternal grandmother. Allergies  Allergen Reactions  . Sulfa Antibiotics Rash     Review of Systems  Constitutional: Negative for appetite change and unexpected weight change.  Gastrointestinal: Negative for abdominal pain, constipation, diarrhea, nausea and vomiting.       Objective:   Physical Exam  Constitutional: He appears well-developed and  well-nourished.  Cardiovascular: Normal rate and regular rhythm.   Pulmonary/Chest: Effort normal and breath sounds normal. No respiratory distress. He has no wheezes. He has no rales.  Genitourinary:  Genitourinary Comments: He has some hemorrhoids noted around the anal region but no thrombosis and no active bleeding. No visible anal fissure. Digital exam reveals no rectal mass. Hemoccult negative.       Assessment:     Single episode of hematochezia with bright red blood per rectum yesterday. He does have some external hemorrhoids but no active bleeding. No fissure.    Plan:     -Confirm date of last colonoscopy and get report if possible -Measures to reduce constipation -Consider over-the-counter topicals such as Anusol HC or preparation H cream -Apparently will need to see GI for repeat colonoscopy later this year and patient will confirm as above  [name] MD [facility] at [facility]

## 2016-09-02 ENCOUNTER — Encounter: Payer: Self-pay | Admitting: Family Medicine

## 2016-09-04 DIAGNOSIS — M50923 Unspecified cervical disc disorder at C6-C7 level: Secondary | ICD-10-CM | POA: Diagnosis not present

## 2016-09-04 DIAGNOSIS — M50922 Unspecified cervical disc disorder at C5-C6 level: Secondary | ICD-10-CM | POA: Diagnosis not present

## 2016-09-04 DIAGNOSIS — M25511 Pain in right shoulder: Secondary | ICD-10-CM | POA: Diagnosis not present

## 2016-09-04 DIAGNOSIS — M5136 Other intervertebral disc degeneration, lumbar region: Secondary | ICD-10-CM | POA: Diagnosis not present

## 2016-09-09 DIAGNOSIS — M50923 Unspecified cervical disc disorder at C6-C7 level: Secondary | ICD-10-CM | POA: Diagnosis not present

## 2016-09-09 DIAGNOSIS — M25511 Pain in right shoulder: Secondary | ICD-10-CM | POA: Diagnosis not present

## 2016-09-09 DIAGNOSIS — M50922 Unspecified cervical disc disorder at C5-C6 level: Secondary | ICD-10-CM | POA: Diagnosis not present

## 2016-09-09 DIAGNOSIS — M5136 Other intervertebral disc degeneration, lumbar region: Secondary | ICD-10-CM | POA: Diagnosis not present

## 2016-09-09 DIAGNOSIS — Z713 Dietary counseling and surveillance: Secondary | ICD-10-CM | POA: Diagnosis not present

## 2016-09-10 DIAGNOSIS — F411 Generalized anxiety disorder: Secondary | ICD-10-CM | POA: Diagnosis not present

## 2016-09-12 ENCOUNTER — Encounter: Payer: Self-pay | Admitting: Gastroenterology

## 2016-09-12 ENCOUNTER — Encounter: Payer: Self-pay | Admitting: Family Medicine

## 2016-09-12 ENCOUNTER — Telehealth: Payer: Self-pay | Admitting: Family Medicine

## 2016-09-12 DIAGNOSIS — K649 Unspecified hemorrhoids: Secondary | ICD-10-CM

## 2016-09-12 NOTE — Telephone Encounter (Signed)
Pt states he had a "little" bit of bright red in his stool this am. Wanted to see you but no appointments.  would like to know what you would have him do?   [id number] - [place], [place] [address]

## 2016-09-12 NOTE — Telephone Encounter (Signed)
Patient is aware and a referral placed. 

## 2016-09-12 NOTE — Telephone Encounter (Signed)
I suggest that we go ahead and set up referral to GI. He has history of adenomatous polyp and was going to be due for repeat colonoscopy this fall anyway. His last colonoscopy was in [facility] years ago.  Could suggest Dr. Carlean Purl who may also be able to address his hemorrhoid issues

## 2016-09-15 ENCOUNTER — Ambulatory Visit (INDEPENDENT_AMBULATORY_CARE_PROVIDER_SITE_OTHER): Payer: BLUE CROSS/BLUE SHIELD | Admitting: Family Medicine

## 2016-09-15 ENCOUNTER — Encounter: Payer: Self-pay | Admitting: Family Medicine

## 2016-09-15 VITALS — BP 120/80 | HR 79 | Temp 98.3°F | Wt 214.3 lb

## 2016-09-15 DIAGNOSIS — M25511 Pain in right shoulder: Secondary | ICD-10-CM | POA: Diagnosis not present

## 2016-09-15 DIAGNOSIS — M50923 Unspecified cervical disc disorder at C6-C7 level: Secondary | ICD-10-CM | POA: Diagnosis not present

## 2016-09-15 DIAGNOSIS — M50922 Unspecified cervical disc disorder at C5-C6 level: Secondary | ICD-10-CM | POA: Diagnosis not present

## 2016-09-15 DIAGNOSIS — L309 Dermatitis, unspecified: Secondary | ICD-10-CM | POA: Diagnosis not present

## 2016-09-15 DIAGNOSIS — M5136 Other intervertebral disc degeneration, lumbar region: Secondary | ICD-10-CM | POA: Diagnosis not present

## 2016-09-15 MED ORDER — METHYLPREDNISOLONE ACETATE 80 MG/ML IJ SUSP
80.0000 mg | Freq: Once | INTRAMUSCULAR | Status: AC
  Administered 2016-09-15: 80 mg via INTRAMUSCULAR

## 2016-09-15 MED ORDER — TRIAMCINOLONE ACETONIDE 0.1 % EX CREA: 1.0000 "application " | TOPICAL_CREAM | Freq: Two times a day (BID) | CUTANEOUS | 0 refills | Status: DC

## 2016-09-15 NOTE — Progress Notes (Signed)
Subjective:     Patient ID: Peter Snyder, male   DOB: 03/20/1955, 61 y.o.   MRN: [id number]  HPI Patient had some recent mild bright red blood per rectum with very intermittent symptoms. Was noted recently have very small fissure around 12 position also some hemorrhoids. He has not had any recent issues with constipation. Eats lots of fiber and drinks lots of water and is walking several miles per day for exercise. He has pending follow-up with GI August 16. He will be due for repeat colonoscopy this fall.  He complains of some itching around the anal area. He tried some Preparation H without much improvement. He states had similar problem in the past.  Past Medical History:  Diagnosis Date  . Arthritis   . CAD (coronary artery disease) 6/14   Cardiac catheterization 6/27/ 2014 ejection fraction 35-40%, 30% proximal left circumflex, 100% tiny obtuse marginal 1 with collaterals, 50% LAD, 50% D1, 100% RCA with collaterals  . Chicken pox   . Colon polyps   . Depression   . Diabetes mellitus ([place])    Diet controlled  . GERD (gastroesophageal reflux disease)   . Hyperlipidemia   . Hypertension   . Kidney stone    Past Surgical History:  Procedure Laterality Date  . TONSILLECTOMY  1963    reports that he has never smoked. He has never used smokeless tobacco. He reports that he does not drink alcohol or use drugs. family history includes Arthritis in his father and paternal grandmother; Heart disease in his maternal grandmother; Hyperlipidemia in his maternal grandmother. Allergies  Allergen Reactions  . Sulfa Antibiotics Rash     Review of Systems  Gastrointestinal: Negative for abdominal pain, constipation and diarrhea.       Objective:   Physical Exam  Constitutional: He appears well-developed and well-nourished.  Cardiovascular: Normal rate and regular rhythm.   Pulmonary/Chest: Effort normal and breath sounds normal. No respiratory distress. He has no wheezes. He has no  rales.  Genitourinary:  Genitourinary Comments: Patient has some nonspecific perianal dermatitis changes with symptom mild diffuse erythema. He has very small fissure around 12:00 position which appears to healing with no active bleeding.       Assessment:     Peri-anal dermatitis    Plan:     -Short-term use only of triamcinolone 0.1% cream twice daily - He's requesting Depo-Medrol which states has helped consistently in the past.  [name] MD [facility] at [facility]

## 2016-09-16 ENCOUNTER — Encounter: Payer: Self-pay | Admitting: Family Medicine

## 2016-09-18 DIAGNOSIS — M50922 Unspecified cervical disc disorder at C5-C6 level: Secondary | ICD-10-CM | POA: Diagnosis not present

## 2016-09-18 DIAGNOSIS — M25511 Pain in right shoulder: Secondary | ICD-10-CM | POA: Diagnosis not present

## 2016-09-18 DIAGNOSIS — M5136 Other intervertebral disc degeneration, lumbar region: Secondary | ICD-10-CM | POA: Diagnosis not present

## 2016-09-18 DIAGNOSIS — M50923 Unspecified cervical disc disorder at C6-C7 level: Secondary | ICD-10-CM | POA: Diagnosis not present

## 2016-09-23 DIAGNOSIS — M25511 Pain in right shoulder: Secondary | ICD-10-CM | POA: Diagnosis not present

## 2016-09-23 DIAGNOSIS — M5136 Other intervertebral disc degeneration, lumbar region: Secondary | ICD-10-CM | POA: Diagnosis not present

## 2016-09-23 DIAGNOSIS — M50922 Unspecified cervical disc disorder at C5-C6 level: Secondary | ICD-10-CM | POA: Diagnosis not present

## 2016-09-23 DIAGNOSIS — Z713 Dietary counseling and surveillance: Secondary | ICD-10-CM | POA: Diagnosis not present

## 2016-09-23 DIAGNOSIS — M50923 Unspecified cervical disc disorder at C6-C7 level: Secondary | ICD-10-CM | POA: Diagnosis not present

## 2016-09-24 ENCOUNTER — Encounter: Payer: Self-pay | Admitting: Family Medicine

## 2016-09-24 ENCOUNTER — Ambulatory Visit (INDEPENDENT_AMBULATORY_CARE_PROVIDER_SITE_OTHER): Payer: BLUE CROSS/BLUE SHIELD | Admitting: Family Medicine

## 2016-09-24 VITALS — BP 110/70 | HR 74 | Temp 98.2°F | Wt 213.4 lb

## 2016-09-24 DIAGNOSIS — M79642 Pain in left hand: Secondary | ICD-10-CM | POA: Diagnosis not present

## 2016-09-24 DIAGNOSIS — J069 Acute upper respiratory infection, unspecified: Secondary | ICD-10-CM | POA: Diagnosis not present

## 2016-09-24 DIAGNOSIS — R253 Fasciculation: Secondary | ICD-10-CM

## 2016-09-24 DIAGNOSIS — B9789 Other viral agents as the cause of diseases classified elsewhere: Secondary | ICD-10-CM | POA: Diagnosis not present

## 2016-09-24 DIAGNOSIS — M65312 Trigger thumb, left thumb: Secondary | ICD-10-CM | POA: Diagnosis not present

## 2016-09-24 DIAGNOSIS — F411 Generalized anxiety disorder: Secondary | ICD-10-CM | POA: Diagnosis not present

## 2016-09-24 NOTE — Progress Notes (Signed)
Subjective:     Patient ID: Peter Snyder, male   DOB: 11-18-1955, 61 y.o.   MRN: [id number]  HPI Patient seen for acute visit. Onset Saturday of nasal congestion and cough. Cough is especially bothersome at night. He's taken Tessalon in the past which helps. Nonsmoker. No fevers or chills. No hemoptysis.  Patient has another issue which is intermittent fasciculations involving his left lower lip. Onset a few days ago.  No facial weakness. He's had similar fasciculations of muscles in the past. No speech changes.  Past Medical History:  Diagnosis Date  . Arthritis   . CAD (coronary artery disease) 6/14   Cardiac catheterization 6/27/ 2014 ejection fraction 35-40%, 30% proximal left circumflex, 100% tiny obtuse marginal 1 with collaterals, 50% LAD, 50% D1, 100% RCA with collaterals  . Chicken pox   . Colon polyps   . Depression   . Diabetes mellitus ([place])    Diet controlled  . GERD (gastroesophageal reflux disease)   . Hyperlipidemia   . Hypertension   . Kidney stone    Past Surgical History:  Procedure Laterality Date  . TONSILLECTOMY  1963    reports that he has never smoked. He has never used smokeless tobacco. He reports that he does not drink alcohol or use drugs. family history includes Arthritis in his father and paternal grandmother; Heart disease in his maternal grandmother; Hyperlipidemia in his maternal grandmother. Allergies  Allergen Reactions  . Sulfa Antibiotics Rash     Review of Systems  Constitutional: Negative for chills and fever.  HENT: Positive for congestion.   Respiratory: Positive for cough.        Objective:   Physical Exam  Constitutional: He is oriented to person, place, and time. He appears well-developed and well-nourished.  HENT:  Right Ear: External ear normal.  Left Ear: External ear normal.  Mouth/Throat: Oropharynx is clear and moist.  Neck: Neck supple.  Cardiovascular: Normal rate and regular rhythm.   Pulmonary/Chest: Effort normal  and breath sounds normal. No respiratory distress. He has no wheezes. He has no rales.  Lymphadenopathy:    He has no cervical adenopathy.  Neurological: He is alert and oriented to person, place, and time. No cranial nerve deficit.       Assessment:     #1 probable viral URI with cough. Nonfocal exam  #2 benign muscle fasciculation of the face    Plan:     -Reassurance. -No indication for further antibiotics at this time. -Follow-up promptly for any fever or worsening symptoms  [name] MD [facility] at [facility]

## 2016-10-01 ENCOUNTER — Encounter: Payer: Self-pay | Admitting: Family Medicine

## 2016-10-01 ENCOUNTER — Ambulatory Visit (INDEPENDENT_AMBULATORY_CARE_PROVIDER_SITE_OTHER): Payer: BLUE CROSS/BLUE SHIELD | Admitting: Family Medicine

## 2016-10-01 VITALS — BP 118/60 | HR 70 | Temp 98.2°F | Ht 73.0 in | Wt 212.2 lb

## 2016-10-01 DIAGNOSIS — L570 Actinic keratosis: Secondary | ICD-10-CM | POA: Diagnosis not present

## 2016-10-01 DIAGNOSIS — L290 Pruritus ani: Secondary | ICD-10-CM

## 2016-10-01 DIAGNOSIS — M25511 Pain in right shoulder: Secondary | ICD-10-CM | POA: Diagnosis not present

## 2016-10-01 DIAGNOSIS — M50922 Unspecified cervical disc disorder at C5-C6 level: Secondary | ICD-10-CM | POA: Diagnosis not present

## 2016-10-01 DIAGNOSIS — L309 Dermatitis, unspecified: Secondary | ICD-10-CM | POA: Diagnosis not present

## 2016-10-01 DIAGNOSIS — M5136 Other intervertebral disc degeneration, lumbar region: Secondary | ICD-10-CM | POA: Diagnosis not present

## 2016-10-01 DIAGNOSIS — M50923 Unspecified cervical disc disorder at C6-C7 level: Secondary | ICD-10-CM | POA: Diagnosis not present

## 2016-10-01 DIAGNOSIS — R202 Paresthesia of skin: Secondary | ICD-10-CM | POA: Diagnosis not present

## 2016-10-01 MED ORDER — HYDROCORTISONE ACETATE 30 MG RE SUPP: 1.0000 | Freq: Two times a day (BID) | RECTAL | 0 refills | Status: DC | PRN

## 2016-10-01 NOTE — Progress Notes (Signed)
Subjective:     Patient ID: Peter Snyder, male   DOB: December 21, 1955, 61 y.o.   MRN: [id number]  HPI Patient seen with some persistent perianal irritation. Was seen couple weeks he has had what looked like some perianal dermatitis. His symptoms improved with triamcinolone 0.1% cream. He was told use short-term only. He does lots of walking and feels like walking and sweaty clothes are aggravating the discomfort. He has not had any constipation. No diarrhea. No bloody stools. No pain with bowel movements. Is scheduled to see GI in August and will be due for repeat colonoscopy this fall. No history of inflammatory bowel or colitis.  He states he has had "hemorrhoids "in the past. He did not have evidence for  anal fissure on recent exam.  Past Medical History:  Diagnosis Date  . Arthritis   . CAD (coronary artery disease) 6/14   Cardiac catheterization 6/27/ 2014 ejection fraction 35-40%, 30% proximal left circumflex, 100% tiny obtuse marginal 1 with collaterals, 50% LAD, 50% D1, 100% RCA with collaterals  . Chicken pox   . Colon polyps   . Depression   . Diabetes mellitus ([place])    Diet controlled  . GERD (gastroesophageal reflux disease)   . Hyperlipidemia   . Hypertension   . Kidney stone    Past Surgical History:  Procedure Laterality Date  . TONSILLECTOMY  1963    reports that he has never smoked. He has never used smokeless tobacco. He reports that he does not drink alcohol or use drugs. family history includes Arthritis in his father and paternal grandmother; Heart disease in his maternal grandmother; Hyperlipidemia in his maternal grandmother. Allergies  Allergen Reactions  . Sulfa Antibiotics Rash       Review of Systems  Constitutional: Negative for chills and fever.  Gastrointestinal: Negative for blood in stool, constipation, diarrhea and rectal pain.       Objective:   Physical Exam  Constitutional: He appears well-developed and well-nourished.  Cardiovascular:  Normal rate and regular rhythm.   Pulmonary/Chest: Effort normal and breath sounds normal. No respiratory distress. He has no wheezes. He has no rales.  Genitourinary:  Genitourinary Comments: No visible anal fissure. He has some mild erythema just inside the anal region.no bleeding.  Nontender. Recent digital exam late June revealed no masses and Hemoccult negative       Assessment:     Perianal irritation. No evidence for perianal abscess or fissure.  H/o hemorrhoids.    Plan:     -Measures to reduce constipation -Continue consuming plenty of fluids and fiber -Hydrocortisone suppository twice daily -Follow-up with GI in August as scheduled  [name] MD [facility] at [facility]

## 2016-10-06 DIAGNOSIS — M79641 Pain in right hand: Secondary | ICD-10-CM | POA: Diagnosis not present

## 2016-10-06 DIAGNOSIS — M65311 Trigger thumb, right thumb: Secondary | ICD-10-CM | POA: Diagnosis not present

## 2016-10-06 DIAGNOSIS — M79642 Pain in left hand: Secondary | ICD-10-CM | POA: Diagnosis not present

## 2016-10-07 DIAGNOSIS — M25511 Pain in right shoulder: Secondary | ICD-10-CM | POA: Diagnosis not present

## 2016-10-07 DIAGNOSIS — M50922 Unspecified cervical disc disorder at C5-C6 level: Secondary | ICD-10-CM | POA: Diagnosis not present

## 2016-10-07 DIAGNOSIS — M5136 Other intervertebral disc degeneration, lumbar region: Secondary | ICD-10-CM | POA: Diagnosis not present

## 2016-10-07 DIAGNOSIS — M50923 Unspecified cervical disc disorder at C6-C7 level: Secondary | ICD-10-CM | POA: Diagnosis not present

## 2016-10-09 DIAGNOSIS — M25511 Pain in right shoulder: Secondary | ICD-10-CM | POA: Diagnosis not present

## 2016-10-09 DIAGNOSIS — M50923 Unspecified cervical disc disorder at C6-C7 level: Secondary | ICD-10-CM | POA: Diagnosis not present

## 2016-10-09 DIAGNOSIS — M5136 Other intervertebral disc degeneration, lumbar region: Secondary | ICD-10-CM | POA: Diagnosis not present

## 2016-10-09 DIAGNOSIS — M50922 Unspecified cervical disc disorder at C5-C6 level: Secondary | ICD-10-CM | POA: Diagnosis not present

## 2016-10-14 DIAGNOSIS — M50922 Unspecified cervical disc disorder at C5-C6 level: Secondary | ICD-10-CM | POA: Diagnosis not present

## 2016-10-14 DIAGNOSIS — M5136 Other intervertebral disc degeneration, lumbar region: Secondary | ICD-10-CM | POA: Diagnosis not present

## 2016-10-14 DIAGNOSIS — M25511 Pain in right shoulder: Secondary | ICD-10-CM | POA: Diagnosis not present

## 2016-10-14 DIAGNOSIS — M50923 Unspecified cervical disc disorder at C6-C7 level: Secondary | ICD-10-CM | POA: Diagnosis not present

## 2016-10-15 DIAGNOSIS — Z713 Dietary counseling and surveillance: Secondary | ICD-10-CM | POA: Diagnosis not present

## 2016-10-15 DIAGNOSIS — F411 Generalized anxiety disorder: Secondary | ICD-10-CM | POA: Diagnosis not present

## 2016-10-17 ENCOUNTER — Encounter: Payer: Self-pay | Admitting: Family Medicine

## 2016-10-17 ENCOUNTER — Ambulatory Visit (INDEPENDENT_AMBULATORY_CARE_PROVIDER_SITE_OTHER): Payer: BLUE CROSS/BLUE SHIELD | Admitting: Family Medicine

## 2016-10-17 VITALS — BP 110/60 | HR 70 | Temp 98.2°F | Wt 210.9 lb

## 2016-10-17 DIAGNOSIS — L290 Pruritus ani: Secondary | ICD-10-CM

## 2016-10-17 MED ORDER — TRIAMCINOLONE ACETONIDE 0.1 % EX CREA: 1.0000 "application " | TOPICAL_CREAM | Freq: Two times a day (BID) | CUTANEOUS | 0 refills | Status: DC

## 2016-10-17 NOTE — Progress Notes (Signed)
Subjective:     Patient ID: Peter Snyder, male   DOB: Jan 22, 1956, 61 y.o.   MRN: [id number]  HPI Patient seen for follow-up regarding perianal irritation. He walks about 3-4 miles per day and some mild irritation with walking. He does have history of hemorrhoids and has pending appointment with GI August 16. He is due for repeat colonoscopy this year. No recent bloody stools. No constipation or diarrhea issues. He's been using multiple things topically including Preparation H with lidocaine, triamcinolone, and recent hydrocortisone suppository with limited relief. No pain with bowel movements.  Past Medical History:  Diagnosis Date  . Arthritis   . CAD (coronary artery disease) 6/14   Cardiac catheterization 6/27/ 2014 ejection fraction 35-40%, 30% proximal left circumflex, 100% tiny obtuse marginal 1 with collaterals, 50% LAD, 50% D1, 100% RCA with collaterals  . Chicken pox   . Colon polyps   . Depression   . Diabetes mellitus ([place])    Diet controlled  . GERD (gastroesophageal reflux disease)   . Hyperlipidemia   . Hypertension   . Kidney stone    Past Surgical History:  Procedure Laterality Date  . TONSILLECTOMY  1963    reports that he has never smoked. He has never used smokeless tobacco. He reports that he does not drink alcohol or use drugs. family history includes Arthritis in his father and paternal grandmother; Heart disease in his maternal grandmother; Hyperlipidemia in his maternal grandmother. Allergies  Allergen Reactions  . Sulfa Antibiotics Rash     Review of Systems  Constitutional: Negative for chills and fever.  Gastrointestinal: Negative for anal bleeding, blood in stool, constipation and diarrhea.       Objective:   Physical Exam  Constitutional: He appears well-developed and well-nourished.  Cardiovascular: Normal rate and regular rhythm.   Pulmonary/Chest: Effort normal and breath sounds normal. No respiratory distress. He has no wheezes. He has no  rales.  Genitourinary:  Genitourinary Comments: Perianal region minimal erythema. No thrombosed hemorrhoids. Skin has slightly atrophic appearance       Assessment:     Chronic intermittent perianal irritation.    Plan:     -We advise not overmedicating. Leave off Preparation H with lidocaine -Removed sweaty and damp clothes as soon as possible after exercise -Continue as needed triamcinolone but avoid daily use or prolonged use  [name] MD [facility] at [facility]

## 2016-10-20 DIAGNOSIS — M25511 Pain in right shoulder: Secondary | ICD-10-CM | POA: Diagnosis not present

## 2016-10-20 DIAGNOSIS — M50923 Unspecified cervical disc disorder at C6-C7 level: Secondary | ICD-10-CM | POA: Diagnosis not present

## 2016-10-20 DIAGNOSIS — M5136 Other intervertebral disc degeneration, lumbar region: Secondary | ICD-10-CM | POA: Diagnosis not present

## 2016-10-20 DIAGNOSIS — M50922 Unspecified cervical disc disorder at C5-C6 level: Secondary | ICD-10-CM | POA: Diagnosis not present

## 2016-10-21 ENCOUNTER — Ambulatory Visit (INDEPENDENT_AMBULATORY_CARE_PROVIDER_SITE_OTHER): Payer: BLUE CROSS/BLUE SHIELD | Admitting: Family Medicine

## 2016-10-21 ENCOUNTER — Encounter: Payer: Self-pay | Admitting: Family Medicine

## 2016-10-21 VITALS — BP 100/64 | HR 75 | Temp 98.0°F | Ht 73.0 in | Wt 210.5 lb

## 2016-10-21 DIAGNOSIS — S0990XA Unspecified injury of head, initial encounter: Secondary | ICD-10-CM

## 2016-10-21 NOTE — Patient Instructions (Signed)
Head Injury, Adult  There are many types of head injuries. Head injuries can be as minor as a bump, or they can be more severe. More severe head injuries include:   A jarring injury to the brain (concussion).   A bruise of the brain (contusion). This means there is bleeding in the brain that can cause swelling.   A cracked skull (skull fracture).   Bleeding in the brain that collects, clots, and forms a bump (hematoma).    After a head injury, you may need to be observed for a while in the emergency department or urgent care. Sometimes admission to the hospital is needed.  After a head injury has happened, most problems occur within the first 24 hours, but side effects may occur up to 7-10 days after the injury. It is important to watch your condition for any changes.  What are the causes?  There are many possible causes of a head injury. A serious head injury may happen to someone who is in a car accident (motor vehicle collision). Other causes of major head injuries include bicycle or motorcycle accidents, sports injuries, and falls.  Risk factors  This condition is more likely to occur in people who:   Drink a lot of alcohol or use drugs.   Are over the age of 65.   Are at risk for falls.    What are the symptoms?  There are many possible symptoms of a head injury. Visible symptoms of a head injury include a bruise, bump, or bleeding at the site of the injury. Other non-visible symptoms include:   Feeling sleepy or not being able to stay awake.   Passing out.   Headache.   Seizures.   Dizziness.   Confusion.   Memory problems.   Nausea or vomiting.    Other possible symptoms that may develop after the head injury include:   Poor attention and concentration.   Fatigue or tiring easily.   Irritability.   Being uncomfortable around bright lights or loud noises.   Anxiety or depression.   Disturbed sleep.    How is this diagnosed?  This condition can usually be diagnosed based on your symptoms, a  description of the injury, and a physical exam. You may also have imaging tests done, such as a CT scan or MRI. You will also be closely watched.  How is this treated?  Treatment for this condition depends on the severity and type of injury you have. The main goal of treatment is to prevent complications and allow the brain time to heal.  For mild head injury, you may be sent home and treatment may include:   Observation. A responsible adult should stay with you for 24 hours after your injury and check on you often.   Physical rest.   Brain rest.   Pain medicines.    For severe brain injury, treatment may include:   Close observation. This includes hospitalization with frequent physical exams. You may need to go to a hospital that specializes in head injury.   Pain medicines.   Breathing support. This may include using a ventilator.   Managing the pressure inside the brain (intracranial pressure, or ICP). This may include:  ? Monitoring the ICP.  ? Giving medicines to decrease the ICP.  ? Positioning you to decrease the ICP.   Medicine to prevent seizures.   Surgery to stop bleeding or to remove blood clots (craniotomy).   Surgery to remove part of the skull (decompressive   craniectomy). This allows room for the brain to swell.    Follow these instructions at home:  Activity   Rest as much as possible and avoid activities that are physically hard or tiring.   Make sure you get enough sleep.   Limit activities that require a lot of thought or attention, such as:  ? Watching TV.  ? Playing memory games and puzzles.  ? Job-related work or homework.  ? Working on the computer, social media, and texting.   Avoid activities that could cause another head injury, such as playing sports, until your health care provider approves. Having another head injury, especially before the first one has healed, can be dangerous.   Ask your health care provider when it is safe for you to return to your regular activities,  including work or school. Ask your health care provider for a step-by-step plan for gradually returning to activities.   Ask your health care provider when you can drive, ride a bicycle, or use heavy machinery. Your ability to react may be slower after a brain injury. Never do these activities if you are dizzy.    Lifestyle   Do not drink alcohol until your health care provider approves, and avoid drug use. Alcohol and certain drugs may slow your recovery and can put you at risk of further injury.   If it is harder than usual to remember things, write them down.   If you are easily distracted, try to do one thing at a time.   Talk with family members or close friends when making important decisions.   Tell your friends, family, a trusted colleague, and work manager about your injury, symptoms, and restrictions. Have them watch for any new or worsening problems.    General instructions   Take over-the-counter and prescription medicines only as told by your health care provider.   Have someone stay with you for 24 hours after your head injury. This person should watch you for any changes in your symptoms and be ready to seek medical help, as needed.   Keep all follow-up visits as told by your health care provider. This is important.    Prevention   Work on improving your balance and strength to avoid falls.   Wear a seatbelt when you are in a moving vehicle.   Wear a helmet when riding a bicycle, skiing, or doing any other sport or activity that has a risk of injury.   Drink alcohol only in moderation.   Take safety measures in your home, such as:  ? Removing clutter and tripping hazards from floors and stairways.  ? Using grab bars in bathrooms and handrails by stairs.  ? Placing non-slip mats on floors and in bathtubs.  ? Improving lighting in dim areas.  Get help right away if:   You have:  ? A severe headache that is not helped by medicine.  ? Trouble walking, have weakness in your arms and legs, or  lose your balance.  ? Clear or bloody fluid coming from your nose or ears.  ? Changes in your vision.  ? A seizure.   You vomit.   Your symptoms get worse.   Your speech is slurred.   You pass out.   You are sleepier and have trouble staying awake.   Your pupils change size.  These symptoms may represent a serious problem that is an emergency. Do not wait to see if the symptoms will go away. Get medical help right away.

## 2016-10-21 NOTE — Progress Notes (Signed)
Subjective:     Patient ID: Peter Snyder, male   DOB: [birth year], 61 y.o.   MRN: [id number]  HPI Patient seen following fall last night. He was at a friend's house and basically was on sitting on the edge of the chair and slipped out of the chair fell forward. When he slipped out of the chair the chair fell over and the back of the chair hit against his head. He fell down striking his sacral region and then apparently fell backwards and hit the occipital region of his head on the floor. No loss of consciousness.  He has some slight fatigue today and possibly some mild queasiness but no true nausea or vomiting. No headache. No focal weakness. No confusion. No prior history of reported concussion. No other injuries reported  He did his usual walk for exercise morning going about 2 miles  Past Medical History:  Diagnosis Date  . Arthritis   . CAD (coronary artery disease) 6/14   Cardiac catheterization 6/27/ 2014 ejection fraction 35-40%, 30% proximal left circumflex, 100% tiny obtuse marginal 1 with collaterals, 50% LAD, 50% D1, 100% RCA with collaterals  . Chicken pox   . Colon polyps   . Depression   . Diabetes mellitus ([place] )    Diet controlled  . GERD (gastroesophageal reflux disease)   . Hyperlipidemia   . Hypertension   . Kidney stone    Past Surgical History:  Procedure Laterality Date  . TONSILLECTOMY  1963    reports that he has never smoked. He has never used smokeless tobacco. He reports that he does not drink alcohol or use drugs. family history includes Arthritis in his father and paternal grandmother; Heart disease in his maternal grandmother; Hyperlipidemia in his maternal grandmother. Allergies  Allergen Reactions  . Sulfa Antibiotics Rash     Review of Systems  Constitutional: Negative for fatigue.  Eyes: Negative for visual disturbance.  Respiratory: Negative for cough, chest tightness and shortness of breath.   Cardiovascular: Negative for chest pain,  palpitations and leg swelling.  Neurological: Negative for dizziness, seizures, syncope, weakness, light-headedness and headaches.       Objective:   Physical Exam  Constitutional: He is oriented to person, place, and time. He appears well-developed and well-nourished.  HENT:  Head: Normocephalic and atraumatic.  Right Ear: External ear normal.  Left Ear: External ear normal.  Mouth/Throat: Oropharynx is clear and moist.  Eyes: Pupils are equal, round, and reactive to light.  Neck: Neck supple. No thyromegaly present.  Cardiovascular: Normal rate and regular rhythm.   Pulmonary/Chest: Effort normal and breath sounds normal. No respiratory distress. He has no wheezes. He has no rales.  Musculoskeletal: He exhibits no edema.  Neurological: He is alert and oriented to person, place, and time. No cranial nerve deficit. Coordination normal.       Assessment:     Recent falls above with possible mild concussion. Nonfocal neurologic exam    Plan:     -Reassurance. No indication for CAT scan at this time. -Handout given on head injury. Follow-up promptly for any changes  [name] MD [facility] at [facility]

## 2016-10-24 DIAGNOSIS — G894 Chronic pain syndrome: Secondary | ICD-10-CM | POA: Diagnosis not present

## 2016-10-24 DIAGNOSIS — M5136 Other intervertebral disc degeneration, lumbar region: Secondary | ICD-10-CM | POA: Diagnosis not present

## 2016-10-24 DIAGNOSIS — M50322 Other cervical disc degeneration at C5-C6 level: Secondary | ICD-10-CM | POA: Diagnosis not present

## 2016-10-27 DIAGNOSIS — M50922 Unspecified cervical disc disorder at C5-C6 level: Secondary | ICD-10-CM | POA: Diagnosis not present

## 2016-10-27 DIAGNOSIS — M50923 Unspecified cervical disc disorder at C6-C7 level: Secondary | ICD-10-CM | POA: Diagnosis not present

## 2016-10-27 DIAGNOSIS — M5136 Other intervertebral disc degeneration, lumbar region: Secondary | ICD-10-CM | POA: Diagnosis not present

## 2016-10-27 DIAGNOSIS — M25511 Pain in right shoulder: Secondary | ICD-10-CM | POA: Diagnosis not present

## 2016-10-29 ENCOUNTER — Ambulatory Visit (INDEPENDENT_AMBULATORY_CARE_PROVIDER_SITE_OTHER): Payer: BLUE CROSS/BLUE SHIELD | Admitting: Family Medicine

## 2016-10-29 ENCOUNTER — Encounter: Payer: Self-pay | Admitting: Family Medicine

## 2016-10-29 VITALS — BP 120/80 | HR 72 | Temp 98.3°F | Wt 209.6 lb

## 2016-10-29 DIAGNOSIS — R110 Nausea: Secondary | ICD-10-CM | POA: Diagnosis not present

## 2016-10-29 DIAGNOSIS — F411 Generalized anxiety disorder: Secondary | ICD-10-CM | POA: Diagnosis not present

## 2016-10-29 MED ORDER — ATORVASTATIN CALCIUM 80 MG PO TABS: 80.0000 mg | ORAL_TABLET | Freq: Every day | ORAL | 1 refills | Status: DC

## 2016-10-29 NOTE — Progress Notes (Signed)
Subjective:     Patient ID: Peter Snyder, male   DOB: Apr 25, 1955, 61 y.o.   MRN: [id number]  HPI Patient seen with some low-grade nausea over the past few days. He's had occasional lightheadedness and symptoms have been very inconsistent. He had a recent fall. Refer to previous note from 10/21/16 for details  HPI "Patient seen following fall last night. He was at a friend's house and basically was on sitting on the edge of the chair and slipped out of the chair fell forward. When he slipped out of the chair the chair fell over and the back of the chair hit against his head. He fell down striking his sacral region and then apparently fell backwards and hit the occipital region of his head on the floor. No loss of consciousness.  He has some slight fatigue today and possibly some mild queasiness but no true nausea or vomiting. No headache. No focal weakness. No confusion. No prior history of reported concussion. No other injuries reported"  He had nonfocal neuro exam and we decided not to get any imaging. His headache has fully resolved. He denies any cognitive changes. He is walking about a mile and a half per day without difficulty. No abdominal pain. No fevers or chills. No stool changes. Has cardiac history. No recent chest pains.  Past Medical History:  Diagnosis Date  . Arthritis   . CAD (coronary artery disease) 6/14   Cardiac catheterization 6/27/ 2014 ejection fraction 35-40%, 30% proximal left circumflex, 100% tiny obtuse marginal 1 with collaterals, 50% LAD, 50% D1, 100% RCA with collaterals  . Chicken pox   . Colon polyps   . Depression   . Diabetes mellitus ([place])    Diet controlled  . GERD (gastroesophageal reflux disease)   . Hyperlipidemia   . Hypertension   . Kidney stone    Past Surgical History:  Procedure Laterality Date  . TONSILLECTOMY  1963    reports that he has never smoked. He has never used smokeless tobacco. He reports that he does not drink alcohol or use  drugs. family history includes Arthritis in his father and paternal grandmother; Heart disease in his maternal grandmother; Hyperlipidemia in his maternal grandmother. Allergies  Allergen Reactions  . Sulfa Antibiotics Rash     Review of Systems  Constitutional: Negative for appetite change and unexpected weight change.  Respiratory: Negative for shortness of breath.   Cardiovascular: Negative for chest pain.  Gastrointestinal: Positive for nausea. Negative for abdominal pain, blood in stool and vomiting.       Objective:   Physical Exam  Constitutional: He is oriented to person, place, and time. He appears well-developed and well-nourished.  Eyes: Pupils are equal, round, and reactive to light.  Neck: Neck supple.  Cardiovascular: Normal rate and regular rhythm.   Pulmonary/Chest: Effort normal and breath sounds normal. No respiratory distress. He has no wheezes. He has no rales.  Abdominal: Soft. There is no tenderness.  Neurological: He is alert and oriented to person, place, and time. No cranial nerve deficit. Coordination normal.       Assessment:     Patient presents with some mild nausea past few days. He did have recent closed head injury but has no other worrisome features by history or exam. He has no headache and nonfocal neuro exam    Plan:     -We recommended observation. Follow-up promptly for any headache or new symptoms -He is due for repeat colonoscopy and has follow-up with GI tomorrow  [name] MD [facility] at [facility]

## 2016-10-29 NOTE — Telephone Encounter (Signed)
Patient coming in for an office visit

## 2016-10-29 NOTE — Patient Instructions (Signed)
Take the Protonix prior to dinner at night Consider supplement with Zantac or Pepcid in the AM.

## 2016-10-30 ENCOUNTER — Ambulatory Visit (INDEPENDENT_AMBULATORY_CARE_PROVIDER_SITE_OTHER): Payer: BLUE CROSS/BLUE SHIELD | Admitting: Gastroenterology

## 2016-10-30 ENCOUNTER — Encounter: Payer: Self-pay | Admitting: Gastroenterology

## 2016-10-30 VITALS — BP 114/66 | HR 72 | Ht 73.0 in | Wt 209.0 lb

## 2016-10-30 DIAGNOSIS — I2583 Coronary atherosclerosis due to lipid rich plaque: Secondary | ICD-10-CM

## 2016-10-30 DIAGNOSIS — R140 Abdominal distension (gaseous): Secondary | ICD-10-CM

## 2016-10-30 DIAGNOSIS — I2510 Atherosclerotic heart disease of native coronary artery without angina pectoris: Secondary | ICD-10-CM | POA: Diagnosis not present

## 2016-10-30 DIAGNOSIS — Z860100 Personal history of colon polyps, unspecified: Secondary | ICD-10-CM

## 2016-10-30 DIAGNOSIS — K219 Gastro-esophageal reflux disease without esophagitis: Secondary | ICD-10-CM | POA: Diagnosis not present

## 2016-10-30 DIAGNOSIS — K625 Hemorrhage of anus and rectum: Secondary | ICD-10-CM

## 2016-10-30 MED ORDER — NA SULFATE-K SULFATE-MG SULF 17.5-3.13-1.6 GM/177ML PO SOLN: 1.0000 | Freq: Once | ORAL | 0 refills | Status: AC

## 2016-10-30 NOTE — Patient Instructions (Addendum)
If you are age 61 or older, your body mass index should be between 23-30. Your Body mass index is 27.57 kg/m. If this is out of the aforementioned range listed, please consider follow up with your Primary Care Provider.  If you are age 84 or younger, your body mass index should be between 19-25. Your Body mass index is 27.57 kg/m. If this is out of the aformentioned range listed, please consider follow up with your Primary Care Provider.   You have been scheduled for a colonoscopy. Please follow written instructions given to you at your visit today.  Please pick up your prep supplies at the pharmacy within the next 1-3 days. If you use inhalers (even only as needed), please bring them with you on the day of your procedure. Your physician has requested that you go to www.startemmi.com and enter the access code given to you at your visit today. This web site gives a general overview about your procedure. However, you should still follow specific instructions given to you by our office regarding your preparation for the procedure.   Food Guidelines for a sensitive stomach  Many people have difficulty digesting certain foods, causing a variety of distressing and embarrassing symptoms such as abdominal pain, bloating and gas.  These foods may need to be avoided or consumed in small amounts.  Here are some tips that might be helpful for you.  1.   Lactose intolerance is the difficulty or complete inability to digest lactose, the natural sugar in milk and anything made from milk.  This condition is harmless, common, and can begin any time during life.  Some people can digest a modest amount of lactose while others cannot tolerate any.  Also, not all dairy products contain equal amounts of lactose.  For example, hard cheeses such as parmesan have less lactose than soft cheeses such as cheddar.  Yogurt has less lactose than milk or cheese.  Many packaged foods (even many brands of bread) have milk, so read  ingredient lists carefully.  It is difficult to test for lactose intolerance, so just try avoiding lactose as much as possible for a week and see what happens with your symptoms.  If you seem to be lactose intolerant, the best plan is to avoid it (but make sure you get calcium from another source).  The next best thing is to use lactase enzyme supplements, available over the counter everywhere.  Just know that many lactose intolerant people need to take several tablets with each serving of dairy to avoid symptoms.  Lastly, a lot of restaurant food is made with milk or butter.  Many are things you might not suspect, such as mashed potatoes, rice and pasta (cooked with butter) and "grilled" items.  If you are lactose intolerant, it never hurts to ask your server what has milk or butter.  2.   Fiber is an important part of your diet, but not all fiber is well-tolerated.  Insoluble fiber such as bran is often consumed by normal gut bacteria and converted into gas.  Soluble fiber such as oats, squash, carrots and green beans are typically tolerated better.  3.   Some types of carbohydrates can be poorly digested.  Examples include: fructose (apples, cherries, pears, raisins and other dried fruits), fructans (onions, zucchini, large amounts of wheat), sorbitol/mannitol/xylitol and sucralose/Splenda (common artificial sweeteners), and raffinose (lentils, broccoli, cabbage, asparagus, brussel sprouts, many types of beans).  Do a Development worker, community for [employer] and you will find helpful  information. Beano, a dietary supplement, will often help with raffinose-containing foods.  As with lactase tablets, you may need several per serving.  4.   Whenever possible, avoid processed food&meats and chemical additives.  High fructose corn syrup, a common sweetener, may be difficult to digest.  Eggs and soy (comes from the soybean, and added to many foods now) are the other most common bloating/gassy foods.  - Dr. [facility]  _______________________________________________________________________________________  For the perianal skin tear:  Recticare ointment before and after BMs.  Put a small amount of Desitin cream over the area after applying the Recticare ointment.

## 2016-10-30 NOTE — Progress Notes (Signed)
[facility] Consult Note:  History: Peter Snyder 10/30/2016  Referring physician: [name], MD  Reason for consult/chief complaint: Rectal Bleeding (bright red in color; isolated incident; has history of hemorrhoids.)   Subjective  HPI:  This is a 61 year old man referred by Dr. Elease Hashimoto for rectal bleeding and a history of colon polyp. [name] recently had an incident of small-volume rectal bleeding with some perianal pain. It may recur during a period of constipation, but generally he has been regular. He was told it may be hemorrhoids on exam, and he brought reports from his previous colonoscopy in 2013. A 6 mm adenomatous polyp was removed and a 3 mm hyperplastic polyp. He has some tendencies to bloating and "gurgling", but no generalized abdominal pain, dysphagia, odynophagia,  vomiting or weight loss. He has lately been on a PPI for some nausea that was felt related to a concussion he sustained after a fall.   ROS:  Review of Systems  Constitutional: Negative for appetite change and unexpected weight change.  HENT: Negative for mouth sores and voice change.   Eyes: Negative for pain and redness.  Respiratory: Negative for cough and shortness of breath.   Cardiovascular: Negative for chest pain and palpitations.  Genitourinary: Negative for dysuria and hematuria.  Musculoskeletal: Negative for arthralgias and myalgias.  Skin: Negative for pallor and rash.  Neurological: Negative for weakness and headaches.  Hematological: Negative for adenopathy.     Past Medical History: Past Medical History:  Diagnosis Date  . Arthritis   . CAD (coronary artery disease) 6/14   Cardiac catheterization 6/27/ 2014 ejection fraction 35-40%, 30% proximal left circumflex, 100% tiny obtuse marginal 1 with collaterals, 50% LAD, 50% D1, 100% RCA with collaterals  . Chicken pox   . Colon polyps   . Depression   . Diabetes mellitus ([place])    Diet controlled  .  GERD (gastroesophageal reflux disease)   . Hyperlipidemia   . Hypertension   . Kidney stone      Past Surgical History: Past Surgical History:  Procedure Laterality Date  . TONSILLECTOMY  1963     Family History: Family History  Problem Relation Age of Onset  . Melanoma Mother        metastatic  . Arthritis Father   . Colon polyps Father   . Hyperlipidemia Maternal Grandmother   . Heart disease Maternal Grandmother   . Arthritis Paternal Grandmother   . Clotting disorder Paternal Grandmother   . Clotting disorder Paternal Grandfather   . Liver cancer Paternal Grandfather   . Colon cancer Neg Hx   . Esophageal cancer Neg Hx   . Rectal cancer Neg Hx     Social History: Social History   Social History  . Marital status: Married    Spouse name: N/A  . Number of children: 3  . Years of education: N/A   Occupational History  . retired     Retired   Social History Main Topics  . Smoking status: Former Research scientist (life sciences)  . Smokeless tobacco: Never Used     Comment: quit 15 years ago -socially  . Alcohol use No  . Drug use: No  . Sexual activity: Not Asked   Other Topics Concern  . None   Social History Narrative  . None    Allergies: Allergies  Allergen Reactions  . Sulfa Antibiotics Rash    Outpatient Meds: Current Outpatient Prescriptions  Medication Sig Dispense Refill  . acetaminophen (TYLENOL) 500 MG tablet Take 500  mg by mouth every 6 (six) hours as needed for moderate pain.    Marland Kitchen ALPRAZolam (XANAX) 0.25 MG tablet Take 0.25 mg by mouth daily.     Marland Kitchen aspirin 81 MG tablet Take 81 mg by mouth daily.    Marland Kitchen atorvastatin (LIPITOR) 80 MG tablet Take 1 tablet (80 mg total) by mouth daily. 90 tablet 1  . Bismuth Tribromoph-Petrolatum (XEROFORM PETROLAT PATCH 4"X4") PADS USE AS DIRECTED TWICE A DAY EXTERNALLY 30 DAYS  4  . carvedilol (COREG) 12.5 MG tablet TAKE 1 TABLET (12.5 MG TOTAL) BY MOUTH 2 (TWO) TIMES DAILY WITH A MEAL. 180 tablet 1  . celecoxib (CELEBREX) 200  MG capsule Take 200 mg by mouth daily.     . clonazePAM (KLONOPIN) 0.5 MG tablet Take 0.5 mg by mouth at bedtime.     . Dermatological Products, Misc. (TETRIX) CREA Apply topically See admin instructions.  3  . ELIDEL 1 % cream APPLY TO AFFECTED AREA TWICE A DAY AS NEEDED FOR 14 DAYS  2  . escitalopram (LEXAPRO) 20 MG tablet Take 20 mg by mouth daily.    Marland Kitchen ezetimibe (ZETIA) 10 MG tablet TAKE 1 TABLET (10 MG TOTAL) BY MOUTH DAILY. 90 tablet 1  . fluticasone (FLONASE) 50 MCG/ACT nasal spray Place 2 sprays into both nostrils daily. 16 g 6  . gabapentin (NEURONTIN) 600 MG tablet Take 1 tablet (600 mg total) by mouth 2 (two) times daily. 180 tablet 1  . lisinopril (PRINIVIL,ZESTRIL) 20 MG tablet Take 20 mg by mouth daily.    Marland Kitchen LOTEMAX 0.5 % GEL     . pantoprazole (PROTONIX) 40 MG tablet TAKE 1 TABLET (40 MG TOTAL) BY MOUTH DAILY. 90 tablet 0  . RESTASIS 0.05 % ophthalmic emulsion Place 1 drop into both eyes 2 (two) times daily.     . tacrolimus (PROTOPIC) 0.1 % ointment     . tamsulosin (FLOMAX) 0.4 MG CAPS capsule Take 1 capsule (0.4 mg total) by mouth daily after supper. 90 capsule 3  . triamcinolone cream (KENALOG) 0.1 % Apply 1 application topically 2 (two) times daily. 30 g 0  . urea (CARMOL) 10 % cream APPLY TO AFFECTED AREA TWICE A DAY AS NEEDED  3  . VOLTAREN 1 % GEL Apply 2 g topically daily.     Marland Kitchen zolpidem (AMBIEN) 10 MG tablet Take 10 mg by mouth at bedtime as needed for sleep.    . Na Sulfate-K Sulfate-Mg Sulf 17.5-3.13-1.6 GM/180ML SOLN Take 1 kit by mouth once. 354 mL 0   No current facility-administered medications for this visit.       ___________________________________________________________________ Objective   Exam:  BP 114/66   Pulse 72   Ht _0  (1.854 m)   Wt 209 lb (94.8 kg)   BMI 27.57 kg/m    General: this is a(n) Well-appearing man   Eyes: sclera anicteric, no redness  ENT: oral mucosa moist without lesions, no cervical or supraclavicular  lymphadenopathy, good dentition  CV: RRR without murmur, S1/S2, no JVD, no peripheral edema  Resp: clear to auscultation bilaterally, normal RR and effort noted  GI: soft, no tenderness, with active bowel sounds. No guarding or palpable organomegaly noted.  Skin; warm and dry, no rash or jaundice noted  Neuro: awake, alert and oriented x 3. Normal gross motor function and fluent speech Rectal: Small anterior skin tear with a spot of adherent blood  No anal fissure, tenderness or palpable internal lesions  Labs: CBC Latest Ref Rng & Units  03/20/2016 05/31/2014 04/03/2014  WBC 4.0 - 10.5 K/uL 4.8 5.3 7.2  Hemoglobin 13.0 - 17.0 g/dL 13.8 14.1 13.4  Hematocrit 39.0 - 52.0 % 39.7 41.7 40.6  Platelets 150.0 - 400.0 K/uL 175.0 148.0(L) 111(L)    Assessment: Encounter Diagnoses  Name Primary?  Marland Kitchen Anal bleeding Yes  . Personal history of colonic polyps   . Gastroesophageal reflux disease, esophagitis presence not specified   . Abdominal bloating   . Coronary artery disease due to lipid rich plaque     His anal bleeding appears to be from a small anterior skin tear without a deep fissure. This coincides with his feelings of discomfort when walking. I think this probably occurred because of localized trauma from toilet hygiene. He is due for a surveillance colonoscopy, but that should wait 4-6 weeks to allow time for this skin lesion to heal. He also sounds at the has mild IBS and possibly maldigestion.  Plan:  I wrote down a plan for Recticare/Desitin Colonoscopy scheduled for late October  Thank you for the courtesy of this consult.  Please call me with any questions or concerns.  Nelida Meuse III  CC: [name], MD

## 2016-11-03 DIAGNOSIS — M5136 Other intervertebral disc degeneration, lumbar region: Secondary | ICD-10-CM | POA: Diagnosis not present

## 2016-11-03 DIAGNOSIS — M50923 Unspecified cervical disc disorder at C6-C7 level: Secondary | ICD-10-CM | POA: Diagnosis not present

## 2016-11-03 DIAGNOSIS — M50922 Unspecified cervical disc disorder at C5-C6 level: Secondary | ICD-10-CM | POA: Diagnosis not present

## 2016-11-03 DIAGNOSIS — Z713 Dietary counseling and surveillance: Secondary | ICD-10-CM | POA: Diagnosis not present

## 2016-11-03 DIAGNOSIS — M25511 Pain in right shoulder: Secondary | ICD-10-CM | POA: Diagnosis not present

## 2016-11-05 DIAGNOSIS — L600 Ingrowing nail: Secondary | ICD-10-CM | POA: Diagnosis not present

## 2016-11-06 DIAGNOSIS — M65311 Trigger thumb, right thumb: Secondary | ICD-10-CM | POA: Diagnosis not present

## 2016-11-10 DIAGNOSIS — Z713 Dietary counseling and surveillance: Secondary | ICD-10-CM | POA: Diagnosis not present

## 2016-11-11 DIAGNOSIS — M25511 Pain in right shoulder: Secondary | ICD-10-CM | POA: Diagnosis not present

## 2016-11-11 DIAGNOSIS — M50923 Unspecified cervical disc disorder at C6-C7 level: Secondary | ICD-10-CM | POA: Diagnosis not present

## 2016-11-11 DIAGNOSIS — M5136 Other intervertebral disc degeneration, lumbar region: Secondary | ICD-10-CM | POA: Diagnosis not present

## 2016-11-11 DIAGNOSIS — M50922 Unspecified cervical disc disorder at C5-C6 level: Secondary | ICD-10-CM | POA: Diagnosis not present

## 2016-11-12 DIAGNOSIS — F411 Generalized anxiety disorder: Secondary | ICD-10-CM | POA: Diagnosis not present

## 2016-11-18 ENCOUNTER — Encounter: Payer: Self-pay | Admitting: Family Medicine

## 2016-11-18 DIAGNOSIS — M5136 Other intervertebral disc degeneration, lumbar region: Secondary | ICD-10-CM | POA: Diagnosis not present

## 2016-11-18 DIAGNOSIS — M25511 Pain in right shoulder: Secondary | ICD-10-CM | POA: Diagnosis not present

## 2016-11-18 DIAGNOSIS — M50923 Unspecified cervical disc disorder at C6-C7 level: Secondary | ICD-10-CM | POA: Diagnosis not present

## 2016-11-18 DIAGNOSIS — M50922 Unspecified cervical disc disorder at C5-C6 level: Secondary | ICD-10-CM | POA: Diagnosis not present

## 2016-11-19 ENCOUNTER — Encounter: Payer: Self-pay | Admitting: Family Medicine

## 2016-11-24 ENCOUNTER — Ambulatory Visit (INDEPENDENT_AMBULATORY_CARE_PROVIDER_SITE_OTHER): Payer: BLUE CROSS/BLUE SHIELD | Admitting: Family Medicine

## 2016-11-24 ENCOUNTER — Encounter: Payer: Self-pay | Admitting: Family Medicine

## 2016-11-24 VITALS — BP 98/68 | HR 67 | Temp 98.2°F | Wt 209.2 lb

## 2016-11-24 DIAGNOSIS — M25511 Pain in right shoulder: Secondary | ICD-10-CM | POA: Diagnosis not present

## 2016-11-24 DIAGNOSIS — M50922 Unspecified cervical disc disorder at C5-C6 level: Secondary | ICD-10-CM | POA: Diagnosis not present

## 2016-11-24 DIAGNOSIS — K120 Recurrent oral aphthae: Secondary | ICD-10-CM | POA: Diagnosis not present

## 2016-11-24 DIAGNOSIS — M5136 Other intervertebral disc degeneration, lumbar region: Secondary | ICD-10-CM | POA: Diagnosis not present

## 2016-11-24 DIAGNOSIS — M50923 Unspecified cervical disc disorder at C6-C7 level: Secondary | ICD-10-CM | POA: Diagnosis not present

## 2016-11-24 DIAGNOSIS — Z713 Dietary counseling and surveillance: Secondary | ICD-10-CM | POA: Diagnosis not present

## 2016-11-24 DIAGNOSIS — Z23 Encounter for immunization: Secondary | ICD-10-CM | POA: Diagnosis not present

## 2016-11-24 NOTE — Progress Notes (Signed)
Subjective:     Patient ID: Peter Snyder, male   DOB: 1955/06/16, 61 y.o.   MRN: [id number]  HPI Patient seen with one-day history of sore throat, nasal congestion, and a couple of mouth ulcers. He's had episodes are the past. He has some leftover dexamethasone liquid and started that topically. Good appetite. No fever. Denies any cough.  Past Medical History:  Diagnosis Date  . Arthritis   . CAD (coronary artery disease) 6/14   Cardiac catheterization 6/27/ 2014 ejection fraction 35-40%, 30% proximal left circumflex, 100% tiny obtuse marginal 1 with collaterals, 50% LAD, 50% D1, 100% RCA with collaterals  . Chicken pox   . Colon polyps   . Depression   . Diabetes mellitus ([place])    Diet controlled  . GERD (gastroesophageal reflux disease)   . Hyperlipidemia   . Hypertension   . Kidney stone    Past Surgical History:  Procedure Laterality Date  . TONSILLECTOMY  1963    reports that he has quit smoking. He has never used smokeless tobacco. He reports that he does not drink alcohol or use drugs. family history includes Arthritis in his father and paternal grandmother; Clotting disorder in his paternal grandfather and paternal grandmother; Colon polyps in his father; Heart disease in his maternal grandmother; Hyperlipidemia in his maternal grandmother; Liver cancer in his paternal grandfather; Melanoma in his mother. Allergies  Allergen Reactions  . Sulfa Antibiotics Rash     Review of Systems  Constitutional: Negative for chills and fever.  HENT: Positive for congestion and sore throat.   Respiratory: Negative for cough and shortness of breath.        Objective:   Physical Exam  Constitutional: He appears well-developed and well-nourished.  HENT:  Right Ear: External ear normal.  Left Ear: External ear normal.  Patient has a couple small shallow aphthous ulcers including right side of tongue and left lower lip. No posterior pharynx exudate  Cardiovascular: Normal rate and  regular rhythm.   Pulmonary/Chest: Effort normal and breath sounds normal. No respiratory distress. He has no wheezes. He has no rales.  Skin: No rash noted.       Assessment:     Aphthous ulcers. Suspect acute viral syndrome    Plan:     -Treat symptomatically. Continue dexamethasone suspension as needed -Flu vaccine given  [name] MD [facility] at [facility]

## 2016-11-24 NOTE — Patient Instructions (Signed)
Continue with Dexamethasone topically.

## 2016-11-26 DIAGNOSIS — F411 Generalized anxiety disorder: Secondary | ICD-10-CM | POA: Diagnosis not present

## 2016-12-01 DIAGNOSIS — M25511 Pain in right shoulder: Secondary | ICD-10-CM | POA: Diagnosis not present

## 2016-12-01 DIAGNOSIS — M5136 Other intervertebral disc degeneration, lumbar region: Secondary | ICD-10-CM | POA: Diagnosis not present

## 2016-12-01 DIAGNOSIS — M50923 Unspecified cervical disc disorder at C6-C7 level: Secondary | ICD-10-CM | POA: Diagnosis not present

## 2016-12-01 DIAGNOSIS — M50922 Unspecified cervical disc disorder at C5-C6 level: Secondary | ICD-10-CM | POA: Diagnosis not present

## 2016-12-04 ENCOUNTER — Encounter: Payer: Self-pay | Admitting: Family Medicine

## 2016-12-04 DIAGNOSIS — L97521 Non-pressure chronic ulcer of other part of left foot limited to breakdown of skin: Secondary | ICD-10-CM | POA: Diagnosis not present

## 2016-12-05 DIAGNOSIS — M65311 Trigger thumb, right thumb: Secondary | ICD-10-CM | POA: Diagnosis not present

## 2016-12-05 DIAGNOSIS — M65332 Trigger finger, left middle finger: Secondary | ICD-10-CM | POA: Diagnosis not present

## 2016-12-08 DIAGNOSIS — M25511 Pain in right shoulder: Secondary | ICD-10-CM | POA: Diagnosis not present

## 2016-12-08 DIAGNOSIS — M5136 Other intervertebral disc degeneration, lumbar region: Secondary | ICD-10-CM | POA: Diagnosis not present

## 2016-12-08 DIAGNOSIS — M50923 Unspecified cervical disc disorder at C6-C7 level: Secondary | ICD-10-CM | POA: Diagnosis not present

## 2016-12-08 DIAGNOSIS — M50922 Unspecified cervical disc disorder at C5-C6 level: Secondary | ICD-10-CM | POA: Diagnosis not present

## 2016-12-10 DIAGNOSIS — Z713 Dietary counseling and surveillance: Secondary | ICD-10-CM | POA: Diagnosis not present

## 2016-12-10 DIAGNOSIS — F419 Anxiety disorder, unspecified: Secondary | ICD-10-CM | POA: Diagnosis not present

## 2016-12-12 DIAGNOSIS — L600 Ingrowing nail: Secondary | ICD-10-CM | POA: Diagnosis not present

## 2016-12-15 DIAGNOSIS — M25511 Pain in right shoulder: Secondary | ICD-10-CM | POA: Diagnosis not present

## 2016-12-15 DIAGNOSIS — M50922 Unspecified cervical disc disorder at C5-C6 level: Secondary | ICD-10-CM | POA: Diagnosis not present

## 2016-12-15 DIAGNOSIS — M5136 Other intervertebral disc degeneration, lumbar region: Secondary | ICD-10-CM | POA: Diagnosis not present

## 2016-12-15 DIAGNOSIS — M50923 Unspecified cervical disc disorder at C6-C7 level: Secondary | ICD-10-CM | POA: Diagnosis not present

## 2016-12-22 DIAGNOSIS — Z713 Dietary counseling and surveillance: Secondary | ICD-10-CM | POA: Diagnosis not present

## 2016-12-23 DIAGNOSIS — M50922 Unspecified cervical disc disorder at C5-C6 level: Secondary | ICD-10-CM | POA: Diagnosis not present

## 2016-12-23 DIAGNOSIS — M5136 Other intervertebral disc degeneration, lumbar region: Secondary | ICD-10-CM | POA: Diagnosis not present

## 2016-12-23 DIAGNOSIS — M50923 Unspecified cervical disc disorder at C6-C7 level: Secondary | ICD-10-CM | POA: Diagnosis not present

## 2016-12-23 DIAGNOSIS — M25511 Pain in right shoulder: Secondary | ICD-10-CM | POA: Diagnosis not present

## 2016-12-24 ENCOUNTER — Encounter: Payer: Self-pay | Admitting: Family Medicine

## 2016-12-24 ENCOUNTER — Ambulatory Visit (INDEPENDENT_AMBULATORY_CARE_PROVIDER_SITE_OTHER): Payer: BLUE CROSS/BLUE SHIELD | Admitting: Family Medicine

## 2016-12-24 VITALS — BP 116/60 | HR 65 | Temp 98.3°F | Resp 16 | Ht 73.0 in | Wt 205.0 lb

## 2016-12-24 DIAGNOSIS — F411 Generalized anxiety disorder: Secondary | ICD-10-CM | POA: Diagnosis not present

## 2016-12-24 DIAGNOSIS — R195 Other fecal abnormalities: Secondary | ICD-10-CM | POA: Diagnosis not present

## 2016-12-24 MED ORDER — CILIDINIUM-CHLORDIAZEPOXIDE 2.5-5 MG PO CAPS: 1.0000 | ORAL_CAPSULE | Freq: Three times a day (TID) | ORAL | 1 refills | Status: DC

## 2016-12-24 NOTE — Patient Instructions (Signed)
Food Choices to Help Relieve Diarrhea, Adult When you have diarrhea, the foods you eat and your eating habits are very important. Choosing the right foods and drinks can help:  Relieve diarrhea.  Replace lost fluids and nutrients.  Prevent dehydration.  What general guidelines should I follow? Relieving diarrhea  Choose foods with less than 2 g or .07 oz. of fiber per serving.  Limit fats to less than 8 tsp (38 g or 1.34 oz.) a day.  Avoid the following: ? Foods and beverages sweetened with high-fructose corn syrup, honey, or sugar alcohols such as xylitol, sorbitol, and mannitol. ? Foods that contain a lot of fat or sugar. ? Fried, greasy, or spicy foods. ? High-fiber grains, breads, and cereals. ? Raw fruits and vegetables.  Eat foods that are rich in probiotics. These foods include dairy products such as yogurt and fermented milk products. They help increase healthy bacteria in the stomach and intestines (gastrointestinal tract, or GI tract).  If you have lactose intolerance, avoid dairy products. These may make your diarrhea worse.  Take medicine to help stop diarrhea (antidiarrheal medicine) only as told by your health care provider. Replacing nutrients  Eat small meals or snacks every 3-4 hours.  Eat bland foods, such as white rice, toast, or baked potato, until your diarrhea starts to get better. Gradually reintroduce nutrient-rich foods as tolerated or as told by your health care provider. This includes: ? Well-cooked protein foods. ? Peeled, seeded, and soft-cooked fruits and vegetables. ? Low-fat dairy products.  Take vitamin and mineral supplements as told by your health care provider. Preventing dehydration   Start by sipping water or a special solution to prevent dehydration (oral rehydration solution, ORS). Urine that is clear or pale yellow means that you are getting enough fluid.  Try to drink at least 8-10 cups of fluid each day to help replace lost  fluids.  You may add other liquids in addition to water, such as clear juice or decaffeinated sports drinks, as tolerated or as told by your health care provider.  Avoid drinks with caffeine, such as coffee, tea, or soft drinks.  Avoid alcohol. What foods are recommended? The items listed may not be a complete list. Talk with your health care provider about what dietary choices are best for you. Grains White rice. White, French, or pita breads (fresh or toasted), including plain rolls, buns, or bagels. White pasta. Saltine, soda, or graham crackers. Pretzels. Low-fiber cereal. Cooked cereals made with water (such as cornmeal, farina, or cream cereals). Plain muffins. Matzo. Melba toast. Zwieback. Vegetables Potatoes (without the skin). Most well-cooked and canned vegetables without skins or seeds. Tender lettuce. Fruits Apple sauce. Fruits canned in juice. Cooked apricots, cherries, grapefruit, peaches, pears, or plums. Fresh bananas and cantaloupe. Meats and other protein foods Baked or boiled chicken. Eggs. Tofu. Fish. Seafood. Smooth nut butters. Ground or well-cooked tender beef, ham, veal, lamb, pork, or poultry. Dairy Plain yogurt, kefir, and unsweetened liquid yogurt. Lactose-free milk, buttermilk, skim milk, or soy milk. Low-fat or nonfat hard cheese. Beverages Water. Low-calorie sports drinks. Fruit juices without pulp. Strained tomato and vegetable juices. Decaffeinated teas. Sugar-free beverages not sweetened with sugar alcohols. Oral rehydration solutions, if approved by your health care provider. Seasoning and other foods Bouillon, broth, or soups made from recommended foods. What foods are not recommended? The items listed may not be a complete list. Talk with your health care provider about what dietary choices are best for you. Grains Whole grain, whole wheat,   bran, or rye breads, rolls, pastas, and crackers. Wild or brown rice. Whole grain or bran cereals. Barley. Oats and  oatmeal. Corn tortillas or taco shells. Granola. Popcorn. Vegetables Raw vegetables. Fried vegetables. Cabbage, broccoli, Brussels sprouts, artichokes, baked beans, beet greens, corn, kale, legumes, peas, sweet potatoes, and yams. Potato skins. Cooked spinach and cabbage. Fruits Dried fruit, including raisins and dates. Raw fruits. Stewed or dried prunes. Canned fruits with syrup. Meat and other protein foods Fried or fatty meats. Deli meats. Chunky nut butters. Nuts and seeds. Beans and lentils. Bacon. Hot dogs. Sausage. Dairy High-fat cheeses. Whole milk, chocolate milk, and beverages made with milk, such as milk shakes. Half-and-half. Cream. sour cream. Ice cream. Beverages Caffeinated beverages (such as coffee, tea, soda, or energy drinks). Alcoholic beverages. Fruit juices with pulp. Prune juice. Soft drinks sweetened with high-fructose corn syrup or sugar alcohols. High-calorie sports drinks. Fats and oils Butter. Cream sauces. Margarine. Salad oils. Plain salad dressings. Olives. Avocados. Mayonnaise. Sweets and desserts Sweet rolls, doughnuts, and sweet breads. Sugar-free desserts sweetened with sugar alcohols such as xylitol and sorbitol. Seasoning and other foods Honey. Hot sauce. Chili powder. Gravy. Cream-based or milk-based soups. Pancakes and waffles. Summary  When you have diarrhea, the foods you eat and your eating habits are very important.  Make sure you get at least 8-10 cups of fluid each day, or enough to keep your urine clear or pale yellow.  Eat bland foods and gradually reintroduce healthy, nutrient-rich foods as tolerated, or as told by your health care provider.  Avoid high-fiber, fried, greasy, or spicy foods. This information is not intended to replace advice given to you by your health care provider. Make sure you discuss any questions you have with your health care provider. Document Released: 05/24/2003 Document Revised: 02/29/2016 Document Reviewed:  02/29/2016 Elsevier Interactive Patient Education  2017 [employer] Inc[employer]  

## 2016-12-24 NOTE — Progress Notes (Signed)
Subjective:     Patient ID: Peter Snyder, male   DOB: 10-26-1955, 61 y.o.   MRN: [id number]  HPI Patient seen with couple day history of loose stools. He states he was at high school reunion on Friday and Saturday. By Sunday had some loose bowel movement but only about 1 or 2 loose stools per day. No bloody stools. No fevers or chills. Mild intermittent abdominal cramping. Had leftover Librax which helped. No nausea or vomiting. No recent travels. No recent antibiotics. He is scheduled for colonoscopy on the 23rd.  Past Medical History:  Diagnosis Date  . Arthritis   . CAD (coronary artery disease) 6/14   Cardiac catheterization 6/27/ 2014 ejection fraction 35-40%, 30% proximal left circumflex, 100% tiny obtuse marginal 1 with collaterals, 50% LAD, 50% D1, 100% RCA with collaterals  . Chicken pox   . Colon polyps   . Depression   . Diabetes mellitus ([place])    Diet controlled  . GERD (gastroesophageal reflux disease)   . Hyperlipidemia   . Hypertension   . Kidney stone    Past Surgical History:  Procedure Laterality Date  . TONSILLECTOMY  1963    reports that he has quit smoking. He has never used smokeless tobacco. He reports that he does not drink alcohol or use drugs. family history includes Arthritis in his father and paternal grandmother; Clotting disorder in his paternal grandfather and paternal grandmother; Colon polyps in his father; Heart disease in his maternal grandmother; Hyperlipidemia in his maternal grandmother; Liver cancer in his paternal grandfather; Melanoma in his mother. Allergies  Allergen Reactions  . Sulfa Antibiotics Rash     Review of Systems  Constitutional: Negative for appetite change, chills and fever.  Respiratory: Negative for shortness of breath.   Gastrointestinal: Negative for blood in stool, constipation, nausea and vomiting.       Objective:   Physical Exam  Constitutional: He appears well-developed and well-nourished.  HENT:   Mouth/Throat: Oropharynx is clear and moist.  Cardiovascular: Normal rate and regular rhythm.   Pulmonary/Chest: Effort normal and breath sounds normal. No respiratory distress. He has no wheezes. He has no rales.  Abdominal: Soft. Bowel sounds are normal. He exhibits no distension and no mass. There is no tenderness. There is no rebound and no guarding.       Assessment:     Loose stools. Patient also has some associated intermittent abdominal cramping. Question related to something he ate versus viral.    Plan:     -Discussed appropriate diet for loose stools and diarrhea and things to avoid. Focus on bland diet. -Stay well-hydrated -Refill Librax but avoid regular use -Follow-up for any fever, increasing frequency diarrhea, or other concerns  [name] MD [facility] at [facility]

## 2016-12-26 ENCOUNTER — Encounter: Payer: Self-pay | Admitting: Family Medicine

## 2016-12-29 DIAGNOSIS — M25511 Pain in right shoulder: Secondary | ICD-10-CM | POA: Diagnosis not present

## 2016-12-29 DIAGNOSIS — M50922 Unspecified cervical disc disorder at C5-C6 level: Secondary | ICD-10-CM | POA: Diagnosis not present

## 2016-12-29 DIAGNOSIS — M5136 Other intervertebral disc degeneration, lumbar region: Secondary | ICD-10-CM | POA: Diagnosis not present

## 2016-12-29 DIAGNOSIS — M50923 Unspecified cervical disc disorder at C6-C7 level: Secondary | ICD-10-CM | POA: Diagnosis not present

## 2016-12-31 ENCOUNTER — Encounter: Payer: Self-pay | Admitting: Cardiology

## 2017-01-02 ENCOUNTER — Ambulatory Visit (INDEPENDENT_AMBULATORY_CARE_PROVIDER_SITE_OTHER): Payer: BLUE CROSS/BLUE SHIELD | Admitting: Family Medicine

## 2017-01-02 ENCOUNTER — Other Ambulatory Visit: Payer: Self-pay | Admitting: Family Medicine

## 2017-01-02 ENCOUNTER — Encounter: Payer: Self-pay | Admitting: Family Medicine

## 2017-01-02 VITALS — BP 110/70 | HR 82 | Temp 98.2°F | Wt 209.8 lb

## 2017-01-02 DIAGNOSIS — R195 Other fecal abnormalities: Secondary | ICD-10-CM

## 2017-01-02 NOTE — Progress Notes (Signed)
Subjective:     Patient ID: Peter Snyder, male   DOB: 1955-06-08, 61 y.o.   MRN: [id number]  HPI Patient is seen with episode of loose stools yesterday. He's had tendency toward loose stools in the past. He's actually scheduled for colonoscopy next Tuesday. He states Wednesday night he went to a "pot luck "supper. He ate a variety of foods then. He did not have any bloody stools, nausea, vomiting, or any significant abdominal pain-other than a couple of mild abdominal cramps. He took a Librax and Imodium and now has not had any stools today. No fever.  Past Medical History:  Diagnosis Date  . Arthritis   . CAD (coronary artery disease) 6/14   Cardiac catheterization 6/27/ 2014 ejection fraction 35-40%, 30% proximal left circumflex, 100% tiny obtuse marginal 1 with collaterals, 50% LAD, 50% D1, 100% RCA with collaterals  . Chicken pox   . Colon polyps   . Depression   . Diabetes mellitus ([place])    Diet controlled  . GERD (gastroesophageal reflux disease)   . Hyperlipidemia   . Hypertension   . Kidney stone    Past Surgical History:  Procedure Laterality Date  . TONSILLECTOMY  1963    reports that he has quit smoking. He has never used smokeless tobacco. He reports that he does not drink alcohol or use drugs. family history includes Arthritis in his father and paternal grandmother; Clotting disorder in his paternal grandfather and paternal grandmother; Colon polyps in his father; Heart disease in his maternal grandmother; Hyperlipidemia in his maternal grandmother; Liver cancer in his paternal grandfather; Melanoma in his mother. Allergies  Allergen Reactions  . Sulfa Antibiotics Rash     Review of Systems  Constitutional: Negative for appetite change, chills, fever and unexpected weight change.  Respiratory: Negative for cough.   Cardiovascular: Negative for chest pain.  Gastrointestinal: Positive for diarrhea. Negative for abdominal pain, nausea and vomiting.       Objective:    Physical Exam  Constitutional: He appears well-developed and well-nourished.  Cardiovascular: Normal rate and regular rhythm.   Pulmonary/Chest: Effort normal and breath sounds normal. No respiratory distress. He has no wheezes. He has no rales.  Abdominal: Soft. Bowel sounds are normal. He exhibits no distension and no mass. There is no tenderness. There is no rebound and no guarding.       Assessment:     Frequent loose stools yesterday but none today    Plan:     -Patient has just started his low-residue diet for preparation of upcoming colonoscopy -We recommended he not take concomitant Librax and Imodium and try to avoid any further antidiarrheal medications this point if possible   [name] MD [facility] at [facility]

## 2017-01-05 DIAGNOSIS — Z713 Dietary counseling and surveillance: Secondary | ICD-10-CM | POA: Diagnosis not present

## 2017-01-06 ENCOUNTER — Encounter: Payer: Self-pay | Admitting: Family Medicine

## 2017-01-06 ENCOUNTER — Ambulatory Visit (AMBULATORY_SURGERY_CENTER): Payer: BLUE CROSS/BLUE SHIELD | Admitting: Gastroenterology

## 2017-01-06 ENCOUNTER — Encounter: Payer: Self-pay | Admitting: Gastroenterology

## 2017-01-06 VITALS — BP 112/67 | HR 58 | Temp 97.3°F | Resp 10 | Ht 73.0 in | Wt 205.0 lb

## 2017-01-06 DIAGNOSIS — Z86010 Personal history of colonic polyps: Secondary | ICD-10-CM | POA: Diagnosis present

## 2017-01-06 DIAGNOSIS — D122 Benign neoplasm of ascending colon: Secondary | ICD-10-CM

## 2017-01-06 DIAGNOSIS — D123 Benign neoplasm of transverse colon: Secondary | ICD-10-CM

## 2017-01-06 MED ORDER — SODIUM CHLORIDE 0.9 % IV SOLN: 500.0000 mL | INTRAVENOUS | Status: DC

## 2017-01-06 NOTE — Progress Notes (Signed)
Called to room to assist during endoscopic procedure.  Patient ID and intended procedure confirmed with present staff. Received instructions for my participation in the procedure from the performing physician.  

## 2017-01-06 NOTE — Op Note (Signed)
[place] Patient Name: [facility] Procedure Date: 01/06/2017 7:56 AM MRN: [id number] Endoscopist: Mallie Mussel L. Loletha Carrow , MD Age: 61 Referring MD:  Date of Birth: Jun 01, 1955 Gender: Male Account #: [id number] Procedure:                Colonoscopy Indications:              Surveillance: Personal history of adenomatous                            polyps on last colonoscopy 5 years ago (33mm TA 2013) Medicines:                Monitored Anesthesia Care Procedure:                Pre-Anesthesia Assessment:                           - Prior to the procedure, a History and Physical                            was performed, and patient medications and                            allergies were reviewed. The patient's tolerance of                            previous anesthesia was also reviewed. The risks                            and benefits of the procedure and the sedation                            options and risks were discussed with the patient.                            All questions were answered, and informed consent                            was obtained. Anticoagulants: The patient has taken                            aspirin. It was decided not to withhold this                            medication prior to the procedure. ASA Grade                            Assessment: II - A patient with mild systemic                            disease. After reviewing the risks and benefits,                            the patient was deemed in satisfactory condition to  undergo the procedure.                           After obtaining informed consent, the colonoscope                            was passed under direct vision. Throughout the                            procedure, the patient's blood pressure, pulse, and                            oxygen saturations were monitored continuously. The                            Colonoscope was introduced through the  anus and                            advanced to the the cecum, identified by                            appendiceal orifice and ileocecal valve. The                            colonoscopy was performed without difficulty. The                            patient tolerated the procedure well. The quality                            of the bowel preparation was excellent. The                            ileocecal valve, appendiceal orifice, and rectum                            were photographed. The quality of the bowel                            preparation was evaluated using the BBPS [facility]                            Bowel Preparation Scale) with scores of: Right                            Colon = 3, Transverse Colon = 3 and Left Colon = 3                            (entire mucosa seen well with no residual staining,                            small fragments of stool or opaque liquid). The  total BBPS score equals 9. The bowel preparation                            used was SUPREP. Scope In: 8:03:28 AM Scope Out: 8:21:10 AM Scope Withdrawal Time: 0 hours 16 minutes 6 seconds  Total Procedure Duration: 0 hours 17 minutes 42 seconds  Findings:                 The perianal and digital rectal examinations were                            normal.                           Four sessile polyps were found in the transverse                            colon and ascending colon. The polyps were 4 mm in                            size. These polyps were removed with a cold snare.                            Resection and retrieval were complete.                           Internal hemorrhoids were found. The hemorrhoids                            were medium-sized and Grade I (internal hemorrhoids                            that do not prolapse).                           The exam was otherwise without abnormality on                            direct and retroflexion  views. Complications:            No immediate complications. Estimated Blood Loss:     Estimated blood loss was minimal. Impression:               - Four 4 mm polyps in the transverse colon and in                            the ascending colon, removed with a cold snare.                            Resected and retrieved.                           - Internal hemorrhoids.                           - The examination was otherwise normal on direct  and retroflexion views. Recommendation:           - Patient has a contact number available for                            emergencies. The signs and symptoms of potential                            delayed complications were discussed with the                            patient. Return to normal activities tomorrow.                            Written discharge instructions were provided to the                            patient.                           - Resume previous diet.                           - Continue present medications.                           - Await pathology results.                           - Repeat colonoscopy is recommended for                            surveillance. The colonoscopy date will be                            determined after pathology results from today's                            exam become available for review. Hendrix Console L. Loletha Carrow, MD 01/06/2017 8:26:45 AM This report has been signed electronically.

## 2017-01-06 NOTE — Progress Notes (Signed)
Spontaneous respirations throughout. VSS. Resting comfortably. To PACU on room air. Report to  RN. 

## 2017-01-06 NOTE — Patient Instructions (Signed)
YOU HAD AN ENDOSCOPIC PROCEDURE TODAY AT [facility]:   Refer to the procedure report that was given to you for any specific questions about what was found during the examination.  If the procedure report does not answer your questions, please call your gastroenterologist to clarify.  If you requested that your care partner not be given the details of your procedure findings, then the procedure report has been included in a sealed envelope for you to review at your convenience later.  YOU SHOULD EXPECT: Some feelings of bloating in the abdomen. Passage of more gas than usual.  Walking can help get rid of the air that was put into your GI tract during the procedure and reduce the bloating. If you had a lower endoscopy (such as a colonoscopy or flexible sigmoidoscopy) you may notice spotting of blood in your stool or on the toilet paper. If you underwent a bowel prep for your procedure, you may not have a normal bowel movement for a few days.  Please Note:  You might notice some irritation and congestion in your nose or some drainage.  This is from the oxygen used during your procedure.  There is no need for concern and it should clear up in a day or so.  SYMPTOMS TO REPORT IMMEDIATELY:   Following lower endoscopy (colonoscopy or flexible sigmoidoscopy):  Excessive amounts of blood in the stool  Significant tenderness or worsening of abdominal pains  Swelling of the abdomen that is new, acute  Fever of 100F or higher    For urgent or emergent issues, a gastroenterologist can be reached at any hour by calling [phone].   DIET:  We do recommend a small meal at first, but then you may proceed to your regular diet.  Drink plenty of fluids but you should avoid alcoholic beverages for 24 hours.  ACTIVITY:  You should plan to take it easy for the rest of today and you should NOT DRIVE or use heavy machinery until tomorrow (because of the sedation medicines used during the test).     FOLLOW UP: Our staff will call the number listed on your records the next business day following your procedure to check on you and address any questions or concerns that you may have regarding the information given to you following your procedure. If we do not reach you, we will leave a message.  However, if you are feeling well and you are not experiencing any problems, there is no need to return our call.  We will assume that you have returned to your regular daily activities without incident.  If any biopsies were taken you will be contacted by phone or by letter within the next 1-3 weeks.  Please call us at [phone] if you have not heard about the biopsies in 3 weeks.    SIGNATURES/CONFIDENTIALITY: You and/or your care partner have signed paperwork which will be entered into your electronic medical record.  These signatures attest to the fact that that the information above on your After Visit Summary has been reviewed and is understood.  Full responsibility of the confidentiality of this discharge information lies with you and/or your care-partner.   Information on polyps given to you today   Await pathology results

## 2017-01-07 ENCOUNTER — Telehealth: Payer: Self-pay

## 2017-01-07 DIAGNOSIS — M50922 Unspecified cervical disc disorder at C5-C6 level: Secondary | ICD-10-CM | POA: Diagnosis not present

## 2017-01-07 DIAGNOSIS — F411 Generalized anxiety disorder: Secondary | ICD-10-CM | POA: Diagnosis not present

## 2017-01-07 DIAGNOSIS — M25511 Pain in right shoulder: Secondary | ICD-10-CM | POA: Diagnosis not present

## 2017-01-07 DIAGNOSIS — M5136 Other intervertebral disc degeneration, lumbar region: Secondary | ICD-10-CM | POA: Diagnosis not present

## 2017-01-07 DIAGNOSIS — M50923 Unspecified cervical disc disorder at C6-C7 level: Secondary | ICD-10-CM | POA: Diagnosis not present

## 2017-01-07 NOTE — Telephone Encounter (Signed)
  Follow up Call-  Call back number 01/06/2017  Post procedure Call Back phone  # [phone]  Permission to leave phone message Yes  Some recent data might be hidden     Left message

## 2017-01-07 NOTE — Telephone Encounter (Signed)
  Follow up Call-  Call back number 01/06/2017  Post procedure Call Back phone  # [phone]  Permission to leave phone message Yes  Some recent data might be hidden     Left message

## 2017-01-07 NOTE — Telephone Encounter (Signed)
Pt returned your call and said he is doing good today

## 2017-01-08 ENCOUNTER — Encounter: Payer: Self-pay | Admitting: Gastroenterology

## 2017-01-14 DIAGNOSIS — M50922 Unspecified cervical disc disorder at C5-C6 level: Secondary | ICD-10-CM | POA: Diagnosis not present

## 2017-01-14 DIAGNOSIS — M5136 Other intervertebral disc degeneration, lumbar region: Secondary | ICD-10-CM | POA: Diagnosis not present

## 2017-01-14 DIAGNOSIS — M25511 Pain in right shoulder: Secondary | ICD-10-CM | POA: Diagnosis not present

## 2017-01-14 DIAGNOSIS — M50923 Unspecified cervical disc disorder at C6-C7 level: Secondary | ICD-10-CM | POA: Diagnosis not present

## 2017-01-15 DIAGNOSIS — M50923 Unspecified cervical disc disorder at C6-C7 level: Secondary | ICD-10-CM | POA: Diagnosis not present

## 2017-01-15 DIAGNOSIS — M5136 Other intervertebral disc degeneration, lumbar region: Secondary | ICD-10-CM | POA: Diagnosis not present

## 2017-01-15 DIAGNOSIS — M50922 Unspecified cervical disc disorder at C5-C6 level: Secondary | ICD-10-CM | POA: Diagnosis not present

## 2017-01-15 DIAGNOSIS — M25511 Pain in right shoulder: Secondary | ICD-10-CM | POA: Diagnosis not present

## 2017-01-16 DIAGNOSIS — M65332 Trigger finger, left middle finger: Secondary | ICD-10-CM | POA: Diagnosis not present

## 2017-01-16 DIAGNOSIS — M65311 Trigger thumb, right thumb: Secondary | ICD-10-CM | POA: Diagnosis not present

## 2017-01-16 DIAGNOSIS — M65312 Trigger thumb, left thumb: Secondary | ICD-10-CM | POA: Diagnosis not present

## 2017-01-18 ENCOUNTER — Other Ambulatory Visit: Payer: Self-pay | Admitting: Cardiology

## 2017-01-19 DIAGNOSIS — L820 Inflamed seborrheic keratosis: Secondary | ICD-10-CM | POA: Diagnosis not present

## 2017-01-19 DIAGNOSIS — M545 Low back pain: Secondary | ICD-10-CM | POA: Diagnosis not present

## 2017-01-19 DIAGNOSIS — L309 Dermatitis, unspecified: Secondary | ICD-10-CM | POA: Diagnosis not present

## 2017-01-19 DIAGNOSIS — L570 Actinic keratosis: Secondary | ICD-10-CM | POA: Diagnosis not present

## 2017-01-20 DIAGNOSIS — Z713 Dietary counseling and surveillance: Secondary | ICD-10-CM | POA: Diagnosis not present

## 2017-01-20 DIAGNOSIS — M50922 Unspecified cervical disc disorder at C5-C6 level: Secondary | ICD-10-CM | POA: Diagnosis not present

## 2017-01-20 DIAGNOSIS — M5136 Other intervertebral disc degeneration, lumbar region: Secondary | ICD-10-CM | POA: Diagnosis not present

## 2017-01-20 DIAGNOSIS — M25511 Pain in right shoulder: Secondary | ICD-10-CM | POA: Diagnosis not present

## 2017-01-20 DIAGNOSIS — M50923 Unspecified cervical disc disorder at C6-C7 level: Secondary | ICD-10-CM | POA: Diagnosis not present

## 2017-01-21 ENCOUNTER — Encounter: Payer: Self-pay | Admitting: *Deleted

## 2017-01-21 ENCOUNTER — Telehealth: Payer: Self-pay | Admitting: Family Medicine

## 2017-01-21 DIAGNOSIS — F411 Generalized anxiety disorder: Secondary | ICD-10-CM | POA: Diagnosis not present

## 2017-01-21 NOTE — Telephone Encounter (Signed)
See if there is any associated dizziness.  Make sure well hydrated and liberalize sodium slightly.

## 2017-01-21 NOTE — Telephone Encounter (Signed)
° ° ° ° °  Pt call to say he had some low bp reading this morning after he took his bp reading    9am 88/57  9:30 92/60  10am 92/64

## 2017-01-21 NOTE — Telephone Encounter (Signed)
Message sent via my chart

## 2017-01-26 ENCOUNTER — Encounter: Payer: Self-pay | Admitting: Cardiology

## 2017-01-26 ENCOUNTER — Other Ambulatory Visit: Payer: Self-pay | Admitting: Family Medicine

## 2017-01-26 DIAGNOSIS — M5136 Other intervertebral disc degeneration, lumbar region: Secondary | ICD-10-CM | POA: Diagnosis not present

## 2017-01-26 DIAGNOSIS — M50923 Unspecified cervical disc disorder at C6-C7 level: Secondary | ICD-10-CM | POA: Diagnosis not present

## 2017-01-26 DIAGNOSIS — M25511 Pain in right shoulder: Secondary | ICD-10-CM | POA: Diagnosis not present

## 2017-01-26 DIAGNOSIS — M50922 Unspecified cervical disc disorder at C5-C6 level: Secondary | ICD-10-CM | POA: Diagnosis not present

## 2017-01-26 NOTE — Progress Notes (Signed)
HPI: FU coronary artery disease. Previous cardiac care in [place]. Cardiac catheterization performed in June of 2014 showed an ejection fraction of 35-40%. The inferior wall is akinetic. The left main was normal. There was a 30% proximal circumflex, occluded small first obtuse marginal with collaterals, a 50% ramus intermedius, 50% LAD, 50% first diagonal and an occluded right coronary artery with collaterals. Treated medically. Nuclear study January 2017 showed ejection fraction 35%. There was a prior inferior infarct with mild peri-infarct ischemia. Echocardiogram In January 2017 interpretednormal LV systolic function, trace aortic insufficiency mild mitral regurgitation and mild left atrial enlargement. I previously reviewed the echocardiogram and there appearsedto be akinesis of the inferior and basal inferior lateral wall. Ejection fraction in the 35-40% range. Cardiac MRI May 2017 showed akinetic inferior wall with ejection fraction 40%. Biatrial enlargement and mild aortic/mitral regurgitation. MRA March 2018 showed mildly dilated aortic root measuring 3.9 cm. Since last seen, the patient denies any dyspnea on exertion, orthopnea, PND, pedal edema, palpitations, syncope or chest pain.   Current Outpatient Medications  Medication Sig Dispense Refill  . acetaminophen (TYLENOL) 500 MG tablet Take 500 mg by mouth every 6 (six) hours as needed for moderate pain.    Marland Kitchen ALPRAZolam (XANAX) 0.25 MG tablet Take 0.25 mg by mouth daily.     Marland Kitchen aspirin 81 MG tablet Take 81 mg by mouth daily.    Marland Kitchen atorvastatin (LIPITOR) 80 MG tablet Take 1 tablet (80 mg total) by mouth daily. 90 tablet 1  . Bismuth Tribromoph-Petrolatum (XEROFORM PETROLAT PATCH 4"X4") PADS USE AS DIRECTED TWICE A DAY EXTERNALLY 30 DAYS  4  . carvedilol (COREG) 12.5 MG tablet TAKE 1 TABLET (12.5 MG TOTAL) BY MOUTH 2 (TWO) TIMES DAILY WITH A MEAL. 180 tablet 1  . celecoxib (CELEBREX) 200 MG capsule Take 200 mg by mouth daily.     .  clonazePAM (KLONOPIN) 0.5 MG tablet Take 0.5 mg by mouth at bedtime.     . Dermatological Products, Misc. (LEVICYN) GEL Apply 1 application topically 2 (two) times daily after a meal.  2  . Dermatological Products, Misc. (TETRIX) CREA Apply topically See admin instructions.  3  . ELIDEL 1 % cream APPLY TO AFFECTED AREA TWICE A DAY AS NEEDED FOR 14 DAYS  2  . escitalopram (LEXAPRO) 20 MG tablet Take 20 mg by mouth daily.    Marland Kitchen ezetimibe (ZETIA) 10 MG tablet TAKE 1 TABLET (10 MG TOTAL) BY MOUTH DAILY. 90 tablet 1  . fluticasone (FLONASE) 50 MCG/ACT nasal spray Place 2 sprays into both nostrils daily. 16 g 6  . gabapentin (NEURONTIN) 600 MG tablet TAKE 1 TABLET BY MOUTH TWICE A DAY 180 tablet 1  . lisinopril (PRINIVIL,ZESTRIL) 20 MG tablet Take 20 mg by mouth daily.    Marland Kitchen LOTEMAX 0.5 % GEL     . pantoprazole (PROTONIX) 40 MG tablet TAKE 1 TABLET (40 MG TOTAL) BY MOUTH DAILY. 90 tablet 3  . RESTASIS 0.05 % ophthalmic emulsion Place 1 drop into both eyes 2 (two) times daily.     . tacrolimus (PROTOPIC) 0.1 % ointment     . tamsulosin (FLOMAX) 0.4 MG CAPS capsule Take 1 capsule (0.4 mg total) by mouth daily after supper. 90 capsule 3  . triamcinolone cream (KENALOG) 0.1 % Apply 1 application topically 2 (two) times daily. 30 g 0  . urea (CARMOL) 10 % cream APPLY TO AFFECTED AREA TWICE A DAY AS NEEDED  3  . Urea 40 % LOTN  Apply 1 application topically 2 (two) times a week.  3  . VOLTAREN 1 % GEL Apply 2 g topically daily.     Marland Kitchen zolpidem (AMBIEN) 10 MG tablet Take 10 mg by mouth at bedtime as needed for sleep.     No current facility-administered medications for this visit.      Past Medical History:  Diagnosis Date  . Arthritis   . CAD (coronary artery disease) 6/14   Cardiac catheterization 6/27/ 2014 ejection fraction 35-40%, 30% proximal left circumflex, 100% tiny obtuse marginal 1 with collaterals, 50% LAD, 50% D1, 100% RCA with collaterals  . Chicken pox   . Colon polyps   . Depression     . Diabetes mellitus ([place])    Diet controlled  . GERD (gastroesophageal reflux disease)   . Hyperlipidemia   . Hypertension   . Kidney stone     Past Surgical History:  Procedure Laterality Date  . TONSILLECTOMY  1963    Social History   Socioeconomic History  . Marital status: Married    Spouse name: Not on file  . Number of children: 3  . Years of education: Not on file  . Highest education level: Not on file  Social Needs  . Financial resource strain: Not on file  . Food insecurity - worry: Not on file  . Food insecurity - inability: Not on file  . Transportation needs - medical: Not on file  . Transportation needs - non-medical: Not on file  Occupational History  . Occupation: retired    Comment: Retired  Tobacco Use  . Smoking status: Former Research scientist (life sciences)  . Smokeless tobacco: Never Used  . Tobacco comment: quit 15 years ago -socially  Substance and Sexual Activity  . Alcohol use: No    Alcohol/week: 0.0 oz  . Drug use: No  . Sexual activity: Not on file  Other Topics Concern  . Not on file  Social History Narrative  . Not on file    Family History  Problem Relation Age of Onset  . Melanoma Mother        metastatic  . Arthritis Father   . Colon polyps Father   . Hyperlipidemia Maternal Grandmother   . Heart disease Maternal Grandmother   . Arthritis Paternal Grandmother   . Clotting disorder Paternal Grandmother   . Clotting disorder Paternal Grandfather   . Liver cancer Paternal Grandfather   . Colon cancer Neg Hx   . Esophageal cancer Neg Hx   . Rectal cancer Neg Hx     ROS: no fevers or chills, productive cough, hemoptysis, dysphasia, odynophagia, melena, hematochezia, dysuria, hematuria, rash, seizure activity, orthopnea, PND, pedal edema, claudication. Remaining systems are negative.  Physical Exam: Well-developed well-nourished in no acute distress.  Skin is warm and dry.  HEENT is normal.  Neck is supple.  Chest is clear to auscultation with  normal expansion.  Cardiovascular exam is regular rate and rhythm.  Abdominal exam nontender or distended. No masses palpated. Extremities show no edema. neuro grossly intact   A/P  1 coronary artery disease-patient doing well with no chest pain or dyspnea. Continue aspirin and statin.  2 ischemic cardiomyopathy-continue beta blocker and ACE inhibitor.  3 dilated aortic root-repeat MRA March 2019.  4 hyperlipidemia-continue statin.   5 hypertension-blood pressure controlled. Continue present meds.    Kirk Ruths, MD

## 2017-01-27 ENCOUNTER — Other Ambulatory Visit: Payer: Self-pay | Admitting: *Deleted

## 2017-01-27 ENCOUNTER — Encounter: Payer: Self-pay | Admitting: Family Medicine

## 2017-01-27 MED ORDER — PANTOPRAZOLE SODIUM 40 MG PO TBEC: DELAYED_RELEASE_TABLET | ORAL | 3 refills | Status: DC

## 2017-01-29 DIAGNOSIS — M50922 Unspecified cervical disc disorder at C5-C6 level: Secondary | ICD-10-CM | POA: Diagnosis not present

## 2017-01-29 DIAGNOSIS — M50923 Unspecified cervical disc disorder at C6-C7 level: Secondary | ICD-10-CM | POA: Diagnosis not present

## 2017-01-29 DIAGNOSIS — M25511 Pain in right shoulder: Secondary | ICD-10-CM | POA: Diagnosis not present

## 2017-01-29 DIAGNOSIS — M5136 Other intervertebral disc degeneration, lumbar region: Secondary | ICD-10-CM | POA: Diagnosis not present

## 2017-02-02 ENCOUNTER — Encounter: Payer: Self-pay | Admitting: Family Medicine

## 2017-02-02 ENCOUNTER — Ambulatory Visit (INDEPENDENT_AMBULATORY_CARE_PROVIDER_SITE_OTHER): Payer: BLUE CROSS/BLUE SHIELD | Admitting: Family Medicine

## 2017-02-02 VITALS — BP 110/60 | HR 65 | Temp 97.9°F | Wt 212.2 lb

## 2017-02-02 DIAGNOSIS — K219 Gastro-esophageal reflux disease without esophagitis: Secondary | ICD-10-CM | POA: Diagnosis not present

## 2017-02-02 DIAGNOSIS — R0981 Nasal congestion: Secondary | ICD-10-CM

## 2017-02-02 DIAGNOSIS — R300 Dysuria: Secondary | ICD-10-CM | POA: Diagnosis not present

## 2017-02-02 DIAGNOSIS — M50922 Unspecified cervical disc disorder at C5-C6 level: Secondary | ICD-10-CM | POA: Diagnosis not present

## 2017-02-02 DIAGNOSIS — M50923 Unspecified cervical disc disorder at C6-C7 level: Secondary | ICD-10-CM | POA: Diagnosis not present

## 2017-02-02 DIAGNOSIS — M5136 Other intervertebral disc degeneration, lumbar region: Secondary | ICD-10-CM | POA: Diagnosis not present

## 2017-02-02 DIAGNOSIS — M25511 Pain in right shoulder: Secondary | ICD-10-CM | POA: Diagnosis not present

## 2017-02-02 LAB — POCT URINALYSIS DIPSTICK
Bilirubin, UA: NEGATIVE
Blood, UA: NEGATIVE
Glucose, UA: NEGATIVE
Ketones, UA: NEGATIVE
LEUKOCYTES UA: NEGATIVE
NITRITE UA: NEGATIVE
PH UA: 5 (ref 5.0–8.0)
PROTEIN UA: NEGATIVE
Spec Grav, UA: 1.01 (ref 1.010–1.025)
Urobilinogen, UA: 0.2 E.U./dL

## 2017-02-02 NOTE — Patient Instructions (Signed)
Your urine is clear- but let me know if you have any recurrent symptoms

## 2017-02-02 NOTE — Progress Notes (Signed)
Subjective:     Patient ID: Peter Snyder, male   DOB: 1955-04-11, 61 y.o.   MRN: [id number]  HPI Patient seen for several items as follows  Complains of some mild burning with urination with onset over the weekend. None this morning. No gross hematuria. No penile discharge. No fevers or chills. No flank pain. Denies history of UTI. Denies any slow stream.  Symptoms were relatively mild.  Long history of GERD and takes Protonix 40 mg daily. Saturday night he ate dinner with some friends and recalls having a salad with some role of intense and N/A some spaghetti. He had some breakthrough GERD symptoms at night. Symptoms of been improved somewhat since then. He occasionally supplements with Tagamet. No dysphagia. He had some fleeting chest pain which lasted only a few seconds. No exertional chest pain  Nasal congestion past 4-5 days. No facial pain. No fever. Uses nasal steroids intermittently Also noted small aphthous ulcer right hard palate region.  Past Medical History:  Diagnosis Date  . Arthritis   . CAD (coronary artery disease) 6/14   Cardiac catheterization 6/27/ 2014 ejection fraction 35-40%, 30% proximal left circumflex, 100% tiny obtuse marginal 1 with collaterals, 50% LAD, 50% D1, 100% RCA with collaterals  . Chicken pox   . Colon polyps   . Depression   . Diabetes mellitus ([place])    Diet controlled  . GERD (gastroesophageal reflux disease)   . Hyperlipidemia   . Hypertension   . Kidney stone    Past Surgical History:  Procedure Laterality Date  . TONSILLECTOMY  1963    reports that he has quit smoking. he has never used smokeless tobacco. He reports that he does not drink alcohol or use drugs. family history includes Arthritis in his father and paternal grandmother; Clotting disorder in his paternal grandfather and paternal grandmother; Colon polyps in his father; Heart disease in his maternal grandmother; Hyperlipidemia in his maternal grandmother; Liver cancer in his  paternal grandfather; Melanoma in his mother. Allergies  Allergen Reactions  . Sulfa Antibiotics Rash     Review of Systems  Constitutional: Negative for chills and fever.  HENT: Positive for congestion.   Respiratory: Negative for cough and shortness of breath.   Gastrointestinal: Negative for abdominal pain, nausea and vomiting.  Genitourinary: Positive for dysuria. Negative for decreased urine volume, flank pain and hematuria.       Objective:   Physical Exam  Constitutional: He appears well-developed and well-nourished.  HENT:  Right Ear: External ear normal.  Left Ear: External ear normal.  Patient has small aphthous ulcer right hard palate. Posterior pharynx clear  Neck: Neck supple.  Cardiovascular: Normal rate and regular rhythm.  Pulmonary/Chest: Effort normal and breath sounds normal. No respiratory distress. He has no wheezes. He has no rales.  Musculoskeletal: He exhibits no edema.  Lymphadenopathy:    He has no cervical adenopathy.  Skin: No rash noted.       Assessment:     #1 dysuria. Urine dipstick appears completely normal. No evidence for UTI  #2 GERD probably exacerbated by recent dietary choices  #3 nasal congestion    Plan:     -Continue Protonix and dietary modification regarding his GERD -Reassurance regarding his urinalysis which is completely normal -Continue nasal steroids for nasal congestive symptoms  [name] MD [facility]

## 2017-02-03 ENCOUNTER — Telehealth: Payer: Self-pay | Admitting: *Deleted

## 2017-02-03 NOTE — Telephone Encounter (Signed)
Called patient and left message to return call to schedule nurse visit for Shingrix injection. When patient returns call, ok to schedule nurse visit. Please let me know if/when appt is scheduled. 

## 2017-02-03 NOTE — Telephone Encounter (Signed)
yes

## 2017-02-03 NOTE — Telephone Encounter (Signed)
Patient is on wait list for Shingrix, but I do not see orders in chart.  Ok to schedule for Shingrix at [facility]? Thanks.

## 2017-02-04 ENCOUNTER — Encounter: Payer: Self-pay | Admitting: Family Medicine

## 2017-02-04 DIAGNOSIS — R7303 Prediabetes: Secondary | ICD-10-CM

## 2017-02-04 DIAGNOSIS — I10 Essential (primary) hypertension: Secondary | ICD-10-CM

## 2017-02-04 DIAGNOSIS — E785 Hyperlipidemia, unspecified: Secondary | ICD-10-CM

## 2017-02-04 DIAGNOSIS — N400 Enlarged prostate without lower urinary tract symptoms: Secondary | ICD-10-CM

## 2017-02-09 ENCOUNTER — Encounter: Payer: Self-pay | Admitting: Cardiology

## 2017-02-09 ENCOUNTER — Ambulatory Visit (INDEPENDENT_AMBULATORY_CARE_PROVIDER_SITE_OTHER): Payer: BLUE CROSS/BLUE SHIELD | Admitting: Cardiology

## 2017-02-09 VITALS — BP 116/75 | HR 69 | Ht 74.0 in | Wt 213.6 lb

## 2017-02-09 DIAGNOSIS — I255 Ischemic cardiomyopathy: Secondary | ICD-10-CM | POA: Diagnosis not present

## 2017-02-09 DIAGNOSIS — I10 Essential (primary) hypertension: Secondary | ICD-10-CM

## 2017-02-09 DIAGNOSIS — I712 Thoracic aortic aneurysm, without rupture, unspecified: Secondary | ICD-10-CM

## 2017-02-09 DIAGNOSIS — I2510 Atherosclerotic heart disease of native coronary artery without angina pectoris: Secondary | ICD-10-CM | POA: Diagnosis not present

## 2017-02-09 DIAGNOSIS — E7800 Pure hypercholesterolemia, unspecified: Secondary | ICD-10-CM

## 2017-02-09 NOTE — Patient Instructions (Signed)
Medication Instructions:  Your physician recommends that you continue on your current medications as directed. Please refer to the Current Medication list given to you today.   Labwork: none  Testing/Procedures: none  Follow-Up: Your physician wants you to follow-up in: 12 months with Dr. Stanford Breed. You will receive a reminder letter in the mail two months in advance. If you don't receive a letter, please call our office to schedule the follow-up appointment.   Any Other Special Instructions Will Be Listed Below (If Applicable).     If you need a refill on your cardiac medications before your next appointment, please call your pharmacy.

## 2017-02-12 DIAGNOSIS — M50922 Unspecified cervical disc disorder at C5-C6 level: Secondary | ICD-10-CM | POA: Diagnosis not present

## 2017-02-12 DIAGNOSIS — M5136 Other intervertebral disc degeneration, lumbar region: Secondary | ICD-10-CM | POA: Diagnosis not present

## 2017-02-12 DIAGNOSIS — M25511 Pain in right shoulder: Secondary | ICD-10-CM | POA: Diagnosis not present

## 2017-02-12 DIAGNOSIS — M50923 Unspecified cervical disc disorder at C6-C7 level: Secondary | ICD-10-CM | POA: Diagnosis not present

## 2017-02-13 ENCOUNTER — Ambulatory Visit (INDEPENDENT_AMBULATORY_CARE_PROVIDER_SITE_OTHER): Payer: BLUE CROSS/BLUE SHIELD | Admitting: Family Medicine

## 2017-02-13 ENCOUNTER — Encounter: Payer: Self-pay | Admitting: Family Medicine

## 2017-02-13 VITALS — BP 112/80 | HR 61 | Temp 98.2°F | Ht 74.0 in | Wt 212.3 lb

## 2017-02-13 DIAGNOSIS — B349 Viral infection, unspecified: Secondary | ICD-10-CM

## 2017-02-13 DIAGNOSIS — M5136 Other intervertebral disc degeneration, lumbar region: Secondary | ICD-10-CM | POA: Diagnosis not present

## 2017-02-13 DIAGNOSIS — R300 Dysuria: Secondary | ICD-10-CM | POA: Diagnosis not present

## 2017-02-13 DIAGNOSIS — G894 Chronic pain syndrome: Secondary | ICD-10-CM | POA: Diagnosis not present

## 2017-02-13 DIAGNOSIS — B359 Dermatophytosis, unspecified: Secondary | ICD-10-CM

## 2017-02-13 DIAGNOSIS — M50322 Other cervical disc degeneration at C5-C6 level: Secondary | ICD-10-CM | POA: Diagnosis not present

## 2017-02-13 LAB — POCT URINALYSIS DIPSTICK
BILIRUBIN UA: NEGATIVE
GLUCOSE UA: NEGATIVE
Ketones, UA: NEGATIVE
Leukocytes, UA: NEGATIVE
Nitrite, UA: NEGATIVE
Protein, UA: NEGATIVE
RBC UA: NEGATIVE
SPEC GRAV UA: 1.02 (ref 1.010–1.025)
UROBILINOGEN UA: 0.2 U/dL
pH, UA: 5 (ref 5.0–8.0)

## 2017-02-13 NOTE — Patient Instructions (Signed)
I hope you are feeling better soon! Seek care promptly if your symptoms worsen, new concerns arise or you are not improving with treatment.   For the rash, use Lotrimin cream twice daily for 4 weeks.  Please follow-up promptly with your primary doctor if this rash is not completely resolved.  INSTRUCTIONS FOR viral infection INFECTION:  -plenty of rest and fluids  -for the ulcer can use the steroid cream once daily, swish and spit 1/2 water/hydrogen peroxide once daily for 5 days  -nasal saline wash 2-3 times daily (use prepackaged nasal saline or bottled/distilled water if making your own)   -can use AFRIN nasal spray for drainage and nasal congestion - but do NOT use longer then 3-4 days  -can use tylenol (in no history of liver disease)  as directed for aches and sorethroat  -in the winter time, using a humidifier at night is helpful (please follow cleaning instructions)  -if you are taking a cough medication - use only as directed, may also try a teaspoon of honey to coat the throat and throat lozenges. If given a cough medication with codeine or hydrocodone or other narcotic please be advised that this contains a strong and  potentially addicting medication. Please follow instructions carefully, take as little as possible and only use AS NEEDED for severe cough. Discuss potential side effects with your pharmacy. Please do not drive or operate machinery while taking these types of medications. Please do not take other sedating medications, drugs or alcohol while taking this medication without discussing with your doctor.  -for sore throat, salt water gargles can help  -follow up if you have fevers, facial pain, tooth pain, difficulty breathing or are worsening or symptoms persist longer then expected  Upper Respiratory Infection, Adult An upper respiratory infection (URI) is also known as the common cold. It is often caused by a type of germ (virus). Colds are easily spread  (contagious). You can pass it to others by kissing, coughing, sneezing, or drinking out of the same glass. Usually, you get better in 1 to 3  weeks.  However, the cough can last for even longer. HOME CARE   Only take medicine as told by your doctor. Follow instructions provided above.  Drink enough water and fluids to keep your pee (urine) clear or pale yellow.  Get plenty of rest.  Return to work when your temperature is < 100 for 24 hours or as told by your doctor. You may use a face mask and wash your hands to stop your cold from spreading. GET HELP RIGHT AWAY IF:   After the first few days, you feel you are getting worse.  You have questions about your medicine.  You have chills, shortness of breath, or red spit (mucus).  You have pain in the face for more then 1-2 days, especially when you bend forward.  You have a fever, puffy (swollen) neck, pain when you swallow, or white spots in the back of your throat.  You have a bad headache, ear pain, sinus pain, or chest pain.  You have a high-pitched whistling sound when you breathe in and out (wheezing).  You cough up blood.  You have sore muscles or a stiff neck. MAKE SURE YOU:   Understand these instructions.  Will watch your condition.  Will get help right away if you are not doing well or get worse. Document Released: 08/20/2007 Document Revised: 05/26/2011 Document Reviewed: 06/08/2013 [facility] Patient Information 2015 [place], [place]. This information is not intended  to replace advice given to you by your health care provider. Make sure you discuss any questions you have with your health care provider.  

## 2017-02-13 NOTE — Progress Notes (Signed)
HPI:  Acute visit for 2 concerns:  1: Upper resp illness: -Started last few days after keeping his granddaughter over the week -Symptoms include runny nose, slight cough, nasal congestion, sore throat -Shortness of breath, fevers, nausea, vomiting or body aches  2.  Rash: -Gets from time to time on penis, scrotum and in the groin region -Avid exerciser -sweats a lot -He applies a fungal powder which usually clears it up -Started recently, also a little burning when he urinates today -Purulent discharge, fevers, malaise, nausea, vomiting, flank pain, hematuria   ROS: See pertinent positives and negatives per HPI.  Past Medical History:  Diagnosis Date  . Arthritis   . CAD (coronary artery disease) 6/14   Cardiac catheterization 6/27/ 2014 ejection fraction 35-40%, 30% proximal left circumflex, 100% tiny obtuse marginal 1 with collaterals, 50% LAD, 50% D1, 100% RCA with collaterals  . Chicken pox   . Colon polyps   . Depression   . Diabetes mellitus ([place])    Diet controlled  . GERD (gastroesophageal reflux disease)   . Hyperlipidemia   . Hypertension   . Kidney stone     Past Surgical History:  Procedure Laterality Date  . TONSILLECTOMY  1963    Family History  Problem Relation Age of Onset  . Melanoma Mother        metastatic  . Arthritis Father   . Colon polyps Father   . Hyperlipidemia Maternal Grandmother   . Heart disease Maternal Grandmother   . Arthritis Paternal Grandmother   . Clotting disorder Paternal Grandmother   . Clotting disorder Paternal Grandfather   . Liver cancer Paternal Grandfather   . Colon cancer Neg Hx   . Esophageal cancer Neg Hx   . Rectal cancer Neg Hx     Social History   Socioeconomic History  . Marital status: Married    Spouse name: None  . Number of children: 3  . Years of education: None  . Highest education level: None  Social Needs  . Financial resource strain: None  . Food insecurity - worry: None  . Food  insecurity - inability: None  . Transportation needs - medical: None  . Transportation needs - non-medical: None  Occupational History  . Occupation: retired    Comment: Retired  Tobacco Use  . Smoking status: Former Research scientist (life sciences)  . Smokeless tobacco: Never Used  . Tobacco comment: quit 15 years ago -socially  Substance and Sexual Activity  . Alcohol use: No    Alcohol/week: 0.0 oz  . Drug use: No  . Sexual activity: None  Other Topics Concern  . None  Social History Narrative  . None     Current Outpatient Medications:  .  acetaminophen (TYLENOL) 500 MG tablet, Take 500 mg by mouth every 6 (six) hours as needed for moderate pain., Disp: , Rfl:  .  ALPRAZolam (XANAX) 0.25 MG tablet, Take 0.25 mg by mouth daily. , Disp: , Rfl:  .  aspirin 81 MG tablet, Take 81 mg by mouth daily., Disp: , Rfl:  .  atorvastatin (LIPITOR) 80 MG tablet, Take 1 tablet (80 mg total) by mouth daily., Disp: 90 tablet, Rfl: 1 .  Bismuth Tribromoph-Petrolatum (XEROFORM PETROLAT PATCH 4"X4") PADS, USE AS DIRECTED TWICE A DAY EXTERNALLY 30 DAYS, Disp: , Rfl: 4 .  carvedilol (COREG) 12.5 MG tablet, TAKE 1 TABLET (12.5 MG TOTAL) BY MOUTH 2 (TWO) TIMES DAILY WITH A MEAL., Disp: 180 tablet, Rfl: 1 .  celecoxib (CELEBREX) 200 MG capsule, Take  200 mg by mouth daily. , Disp: , Rfl:  .  clonazePAM (KLONOPIN) 0.5 MG tablet, Take 0.5 mg by mouth at bedtime. , Disp: , Rfl:  .  Dermatological Products, Misc. (LEVICYN) GEL, Apply 1 application topically 2 (two) times daily after a meal., Disp: , Rfl: 2 .  Dermatological Products, Misc. (TETRIX) CREA, Apply topically See admin instructions., Disp: , Rfl: 3 .  ELIDEL 1 % cream, APPLY TO AFFECTED AREA TWICE A DAY AS NEEDED FOR 14 DAYS, Disp: , Rfl: 2 .  escitalopram (LEXAPRO) 20 MG tablet, Take 20 mg by mouth daily., Disp: , Rfl:  .  ezetimibe (ZETIA) 10 MG tablet, TAKE 1 TABLET (10 MG TOTAL) BY MOUTH DAILY., Disp: 90 tablet, Rfl: 1 .  fluticasone (FLONASE) 50 MCG/ACT nasal spray,  Place 2 sprays into both nostrils daily., Disp: 16 g, Rfl: 6 .  gabapentin (NEURONTIN) 600 MG tablet, TAKE 1 TABLET BY MOUTH TWICE A DAY, Disp: 180 tablet, Rfl: 1 .  lisinopril (PRINIVIL,ZESTRIL) 20 MG tablet, Take 20 mg by mouth daily., Disp: , Rfl:  .  LOTEMAX 0.5 % GEL, , Disp: , Rfl:  .  pantoprazole (PROTONIX) 40 MG tablet, TAKE 1 TABLET (40 MG TOTAL) BY MOUTH DAILY., Disp: 90 tablet, Rfl: 3 .  RESTASIS 0.05 % ophthalmic emulsion, Place 1 drop into both eyes 2 (two) times daily. , Disp: , Rfl:  .  tacrolimus (PROTOPIC) 0.1 % ointment, , Disp: , Rfl:  .  tamsulosin (FLOMAX) 0.4 MG CAPS capsule, Take 1 capsule (0.4 mg total) by mouth daily after supper., Disp: 90 capsule, Rfl: 3 .  triamcinolone cream (KENALOG) 0.1 %, Apply 1 application topically 2 (two) times daily., Disp: 30 g, Rfl: 0 .  urea (CARMOL) 10 % cream, APPLY TO AFFECTED AREA TWICE A DAY AS NEEDED, Disp: , Rfl: 3 .  Urea 40 % LOTN, Apply 1 application topically 2 (two) times a week., Disp: , Rfl: 3 .  VOLTAREN 1 % GEL, Apply 2 g topically daily. , Disp: , Rfl:  .  zolpidem (AMBIEN) 10 MG tablet, Take 10 mg by mouth at bedtime as needed for sleep., Disp: , Rfl:   EXAM:  Vitals:   02/13/17 1520  BP: 112/80  Pulse: 61  Temp: 98.2 F (36.8 C)    Body mass index is 27.26 kg/m.  GENERAL: vitals reviewed and listed above, alert, oriented, appears well hydrated and in no acute distress  HEENT: atraumatic, conjunttiva clear, no obvious abnormalities on inspection of external nose and ears, normal appearance of ear canals and TMs, clear nasal congestion, mild post oropharyngeal erythema with PND, aphthous ulcer left soft palate, no tonsillar edema or exudate, no sinus TTP  NECK: no obvious masses on inspection  LUNGS: clear to auscultation bilaterally, no wheezes, rales or rhonchi, good air movement  CV: HRRR, no peripheral edema  MS: moves all extremities without noticeable abnormality  GU: Patch of mildly erythema on  the shaft of the penis  PSYCH: pleasant and cooperative, no obvious depression or anxiety  ASSESSMENT AND PLAN:  Discussed the following assessment and plan:  Dysuria - Plan: POC Urinalysis Dipstick  Tinea  Viral illness  -Suspect jock itch as the cause of the recurrent rash, advised 4 weeks of Lotrimin for this cool cotton were clothes -this is likely the cause of the mild burning with urination as well, urine dip was clear -Suspect viral illness as cause of the upper respiratory symptoms and the ulcer, he reports he has had  ulcers in the past several times and was given a steroid cream that usually helps clear the month, he will try this along with symptomatic care -Patient advised to return or notify a doctor immediately if symptoms worsen or persist or new concerns arise.  Patient Instructions  I hope you are feeling better soon! Seek care promptly if your symptoms worsen, new concerns arise or you are not improving with treatment.   For the rash, use Lotrimin cream twice daily for 4 weeks.  Please follow-up promptly with your primary doctor if this rash is not completely resolved.  INSTRUCTIONS FOR viral infection INFECTION:  -plenty of rest and fluids  -for the ulcer can use the steroid cream once daily, swish and spit 1/2 water/hydrogen peroxide once daily for 5 days  -nasal saline wash 2-3 times daily (use prepackaged nasal saline or bottled/distilled water if making your own)   -can use AFRIN nasal spray for drainage and nasal congestion - but do NOT use longer then 3-4 days  -can use tylenol (in no history of liver disease)  as directed for aches and sorethroat  -in the winter time, using a humidifier at night is helpful (please follow cleaning instructions)  -if you are taking a cough medication - use only as directed, may also try a teaspoon of honey to coat the throat and throat lozenges. If given a cough medication with codeine or hydrocodone or other narcotic  please be advised that this contains a strong and  potentially addicting medication. Please follow instructions carefully, take as little as possible and only use AS NEEDED for severe cough. Discuss potential side effects with your pharmacy. Please do not drive or operate machinery while taking these types of medications. Please do not take other sedating medications, drugs or alcohol while taking this medication without discussing with your doctor.  -for sore throat, salt water gargles can help  -follow up if you have fevers, facial pain, tooth pain, difficulty breathing or are worsening or symptoms persist longer then expected  Upper Respiratory Infection, Adult An upper respiratory infection (URI) is also known as the common cold. It is often caused by a type of germ (virus). Colds are easily spread (contagious). You can pass it to others by kissing, coughing, sneezing, or drinking out of the same glass. Usually, you get better in 1 to 3  weeks.  However, the cough can last for even longer. HOME CARE   Only take medicine as told by your doctor. Follow instructions provided above.  Drink enough water and fluids to keep your pee (urine) clear or pale yellow.  Get plenty of rest.  Return to work when your temperature is < 100 for 24 hours or as told by your doctor. You may use a face mask and wash your hands to stop your cold from spreading. GET HELP RIGHT AWAY IF:   After the first few days, you feel you are getting worse.  You have questions about your medicine.  You have chills, shortness of breath, or red spit (mucus).  You have pain in the face for more then 1-2 days, especially when you bend forward.  You have a fever, puffy (swollen) neck, pain when you swallow, or white spots in the back of your throat.  You have a bad headache, ear pain, sinus pain, or chest pain.  You have a high-pitched whistling sound when you breathe in and out (wheezing).  You cough up blood.  You  have sore muscles or a stiff  neck. MAKE SURE YOU:   Understand these instructions.  Will watch your condition.  Will get help right away if you are not doing well or get worse. Document Released: 08/20/2007 Document Revised: 05/26/2011 Document Reviewed: 06/08/2013 [facility] Patient Information 2015 [place], [place]. This information is not intended to replace advice given to you by your health care provider. Make sure you discuss any questions you have with your health care provider.    Colin Benton R., DO

## 2017-02-16 ENCOUNTER — Ambulatory Visit (INDEPENDENT_AMBULATORY_CARE_PROVIDER_SITE_OTHER): Payer: BLUE CROSS/BLUE SHIELD | Admitting: Family Medicine

## 2017-02-16 ENCOUNTER — Ambulatory Visit: Payer: BLUE CROSS/BLUE SHIELD | Admitting: Family Medicine

## 2017-02-16 VITALS — BP 120/80 | HR 66 | Temp 97.8°F | Wt 213.5 lb

## 2017-02-16 DIAGNOSIS — M722 Plantar fascial fibromatosis: Secondary | ICD-10-CM | POA: Diagnosis not present

## 2017-02-16 DIAGNOSIS — R21 Rash and other nonspecific skin eruption: Secondary | ICD-10-CM | POA: Diagnosis not present

## 2017-02-16 DIAGNOSIS — Z713 Dietary counseling and surveillance: Secondary | ICD-10-CM | POA: Diagnosis not present

## 2017-02-16 DIAGNOSIS — M25572 Pain in left ankle and joints of left foot: Secondary | ICD-10-CM | POA: Diagnosis not present

## 2017-02-16 NOTE — Patient Instructions (Signed)
Try OTC 1% hydrocortisone cream twice daily Let me know if rash progresses on the cream.

## 2017-02-16 NOTE — Progress Notes (Signed)
Subjective:     Patient ID: Peter Snyder, male   DOB: 22-Dec-1955, 61 y.o.   MRN: [id number]  HPI Patient seen with ulcerative lesion posterior pharynx and also persistent rash penile region. He's had history of tenia in the past. He walks a lot for exercise. Current rash is confined to the shaft of the penis and not in the groin region. He has some mild burning sensation. Denies any change of soaps or detergents. He tried some Lotrimin cream past few days which seems to be irritating worse.  Long history of recurrent aphthous ulcers. He has dexamethasone topically which she uses. No fever. No difficulty swallowing.  Past Medical History:  Diagnosis Date  . Arthritis   . CAD (coronary artery disease) 6/14   Cardiac catheterization 6/27/ 2014 ejection fraction 35-40%, 30% proximal left circumflex, 100% tiny obtuse marginal 1 with collaterals, 50% LAD, 50% D1, 100% RCA with collaterals  . Chicken pox   . Colon polyps   . Depression   . Diabetes mellitus ([place])    Diet controlled  . GERD (gastroesophageal reflux disease)   . Hyperlipidemia   . Hypertension   . Kidney stone    Past Surgical History:  Procedure Laterality Date  . TONSILLECTOMY  1963    reports that he has quit smoking. he has never used smokeless tobacco. He reports that he does not drink alcohol or use drugs. family history includes Arthritis in his father and paternal grandmother; Clotting disorder in his paternal grandfather and paternal grandmother; Colon polyps in his father; Heart disease in his maternal grandmother; Hyperlipidemia in his maternal grandmother; Liver cancer in his paternal grandfather; Melanoma in his mother. Allergies  Allergen Reactions  . Sulfa Antibiotics Rash     Review of Systems  Constitutional: Negative for chills and fever.  HENT: Positive for sore throat.   Skin: Positive for rash.       Objective:   Physical Exam  Constitutional: He appears well-developed and well-nourished. No  distress.  HENT:  Fairly large aphthous ulcer left posterior fair region. No exudate  Neck: Neck supple.  Cardiovascular: Normal rate and regular rhythm.  Pulmonary/Chest: Effort normal and breath sounds normal.  Lymphadenopathy:    He has no cervical adenopathy.  Skin: Rash noted.  Patient has mildly erythematous blanching nonscaly nonpustular rash on the shaft of his penis. No groin involvement       Assessment:     #1 benign appearing aphthous ulcer left posterior pharynx  #2 nonspecific irritant rash penis. No groin involvement. Not clear this is Candida    Plan:     -Continue topical dexamethasone for aphthous ulcer and recommended saltwater gargles several times daily -Keep groin and pelvic area dry as possible -Consider over-the-counter 1% hydrocortisone cream -Follow-up immediately for any worsening rash or other concerns  [name] MD [facility] at [facility]

## 2017-02-18 ENCOUNTER — Ambulatory Visit: Payer: BLUE CROSS/BLUE SHIELD

## 2017-02-18 DIAGNOSIS — F419 Anxiety disorder, unspecified: Secondary | ICD-10-CM | POA: Diagnosis not present

## 2017-02-19 ENCOUNTER — Ambulatory Visit (INDEPENDENT_AMBULATORY_CARE_PROVIDER_SITE_OTHER): Payer: BLUE CROSS/BLUE SHIELD | Admitting: Family Medicine

## 2017-02-19 ENCOUNTER — Encounter: Payer: Self-pay | Admitting: Family Medicine

## 2017-02-19 VITALS — BP 126/79 | HR 61 | Temp 98.4°F | Ht 74.0 in | Wt 215.4 lb

## 2017-02-19 DIAGNOSIS — R21 Rash and other nonspecific skin eruption: Secondary | ICD-10-CM

## 2017-02-19 NOTE — Progress Notes (Signed)
    Subjective:  Peter Snyder is a 61 y.o. male who presents today with a chief complaint of rash.   HPI:  Rash, established problem Patient seen 6 days ago for rash on shaft of penis.  Was started on Lotrimin which stung and he suddenly stopped taking it.  He saw his PCP 3 days ago for follow-up and was started on hydrocortisone cream.  States that he tried hydrocortisone "0.1%" which helped with his symptoms.  He then got over-the-counter 1% which caused some burning.  No dysuria.  Rash is stable since he was last evaluated 3 days ago.  ROS: Per HPI  Objective:  Physical Exam: BP 126/79   Pulse 61   Temp 98.4 F (36.9 C) (Oral)   Ht 6\' 2"  (1.88 m)   Wt 215 lb 6.4 oz (97.7 kg)   SpO2 97%   BMI 27.66 kg/m   Gen: NAD, resting comfortably GU: Erythematous nonpustular rash along shaft of penis.  No involvement of scrotum or groin.  Assessment/Plan:  Penile rash Likely irritant dermatitis versus topical fungal infection.  Advised patient that he should try treatment for either for at least 1-2 weeks before switching therapy.  Advised him to start over-the-counter hydrocortisone 1% 1-2 times daily for the next 2 weeks.  Recommended that he mix it with a bit of Vaseline to help with the irritation and burning sensation.  Return precautions reviewed.  Recommend that he follow-up with his PCP after 2 weeks if symptoms persist.  Algis Greenhouse. Jerline Pain, MD 02/19/2017 4:57 PM

## 2017-02-25 DIAGNOSIS — M50922 Unspecified cervical disc disorder at C5-C6 level: Secondary | ICD-10-CM | POA: Diagnosis not present

## 2017-02-25 DIAGNOSIS — M50923 Unspecified cervical disc disorder at C6-C7 level: Secondary | ICD-10-CM | POA: Diagnosis not present

## 2017-02-25 DIAGNOSIS — M5136 Other intervertebral disc degeneration, lumbar region: Secondary | ICD-10-CM | POA: Diagnosis not present

## 2017-02-25 DIAGNOSIS — M25511 Pain in right shoulder: Secondary | ICD-10-CM | POA: Diagnosis not present

## 2017-03-02 DIAGNOSIS — Z713 Dietary counseling and surveillance: Secondary | ICD-10-CM | POA: Diagnosis not present

## 2017-03-03 DIAGNOSIS — L821 Other seborrheic keratosis: Secondary | ICD-10-CM | POA: Diagnosis not present

## 2017-03-03 DIAGNOSIS — D225 Melanocytic nevi of trunk: Secondary | ICD-10-CM | POA: Diagnosis not present

## 2017-03-03 DIAGNOSIS — L570 Actinic keratosis: Secondary | ICD-10-CM | POA: Diagnosis not present

## 2017-03-03 DIAGNOSIS — D1801 Hemangioma of skin and subcutaneous tissue: Secondary | ICD-10-CM | POA: Diagnosis not present

## 2017-03-03 DIAGNOSIS — L814 Other melanin hyperpigmentation: Secondary | ICD-10-CM | POA: Diagnosis not present

## 2017-03-04 ENCOUNTER — Other Ambulatory Visit (INDEPENDENT_AMBULATORY_CARE_PROVIDER_SITE_OTHER): Payer: BLUE CROSS/BLUE SHIELD

## 2017-03-04 DIAGNOSIS — M25511 Pain in right shoulder: Secondary | ICD-10-CM | POA: Diagnosis not present

## 2017-03-04 DIAGNOSIS — M50923 Unspecified cervical disc disorder at C6-C7 level: Secondary | ICD-10-CM | POA: Diagnosis not present

## 2017-03-04 DIAGNOSIS — E785 Hyperlipidemia, unspecified: Secondary | ICD-10-CM | POA: Diagnosis not present

## 2017-03-04 DIAGNOSIS — N400 Enlarged prostate without lower urinary tract symptoms: Secondary | ICD-10-CM | POA: Diagnosis not present

## 2017-03-04 DIAGNOSIS — M50922 Unspecified cervical disc disorder at C5-C6 level: Secondary | ICD-10-CM | POA: Diagnosis not present

## 2017-03-04 DIAGNOSIS — R7303 Prediabetes: Secondary | ICD-10-CM

## 2017-03-04 DIAGNOSIS — I10 Essential (primary) hypertension: Secondary | ICD-10-CM | POA: Diagnosis not present

## 2017-03-04 DIAGNOSIS — M5136 Other intervertebral disc degeneration, lumbar region: Secondary | ICD-10-CM | POA: Diagnosis not present

## 2017-03-04 LAB — BASIC METABOLIC PANEL
BUN: 20 mg/dL (ref 6–23)
CALCIUM: 9.1 mg/dL (ref 8.4–10.5)
CO2: 30 meq/L (ref 19–32)
CREATININE: 1.04 mg/dL (ref 0.40–1.50)
Chloride: 104 mEq/L (ref 96–112)
GFR: 76.96 mL/min (ref >60.00)
GLUCOSE: 99 mg/dL (ref 70–99)
Potassium: 4.6 mEq/L (ref 3.5–5.1)
Sodium: 141 mEq/L (ref 135–145)

## 2017-03-04 LAB — TSH: TSH: 1.41 u[IU]/mL (ref 0.35–4.50)

## 2017-03-04 LAB — HEMOGLOBIN A1C: HEMOGLOBIN A1C: 6.2 % (ref 4.6–6.5)

## 2017-03-04 LAB — HEPATIC FUNCTION PANEL
ALK PHOS: 66 U/L (ref 39–117)
ALT: 19 U/L (ref 0–53)
AST: 19 U/L (ref 0–37)
Albumin: 4.5 g/dL (ref 3.5–5.2)
BILIRUBIN TOTAL: 0.5 mg/dL (ref 0.2–1.2)
Bilirubin, Direct: 0.1 mg/dL (ref 0.0–0.3)
Total Protein: 6.8 g/dL (ref 6.0–8.3)

## 2017-03-04 LAB — CBC WITH DIFFERENTIAL/PLATELET
BASOS ABS: 0 10*3/uL (ref 0.0–0.1)
Basophils Relative: 0.7 % (ref 0.0–3.0)
EOS ABS: 0.1 10*3/uL (ref 0.0–0.7)
Eosinophils Relative: 1.6 % (ref 0.0–5.0)
HCT: 39.5 % (ref 39.0–52.0)
Hemoglobin: 13.3 g/dL (ref 13.0–17.0)
LYMPHS ABS: 1.7 10*3/uL (ref 0.7–4.0)
Lymphocytes Relative: 30.4 % (ref 12.0–46.0)
MCHC: 33.6 g/dL (ref 30.0–36.0)
MCV: 94.5 fl (ref 78.0–100.0)
Monocytes Absolute: 0.4 10*3/uL (ref 0.1–1.0)
Monocytes Relative: 7.2 % (ref 3.0–12.0)
NEUTROS PCT: 60.1 % (ref 43.0–77.0)
Neutro Abs: 3.3 10*3/uL (ref 1.4–7.7)
PLATELETS: 146 10*3/uL — AB (ref 150.0–400.0)
RBC: 4.18 Mil/uL — ABNORMAL LOW (ref 4.22–5.81)
RDW: 12.4 % (ref 11.5–15.5)
WBC: 5.5 10*3/uL (ref 4.0–10.5)

## 2017-03-04 LAB — LIPID PANEL
Cholesterol: 113 mg/dL (ref 0–200)
HDL: 34.2 mg/dL — ABNORMAL LOW (ref >39.00)
LDL Cholesterol: 55 mg/dL (ref 0–99)
NONHDL: 78.47
Total CHOL/HDL Ratio: 3
Triglycerides: 115 mg/dL (ref 0.0–149.0)
VLDL: 23 mg/dL (ref 0.0–40.0)

## 2017-03-04 LAB — PSA: PSA: 1.42 ng/mL (ref 0.10–4.00)

## 2017-03-06 ENCOUNTER — Ambulatory Visit (INDEPENDENT_AMBULATORY_CARE_PROVIDER_SITE_OTHER): Payer: BLUE CROSS/BLUE SHIELD | Admitting: Family Medicine

## 2017-03-06 ENCOUNTER — Encounter: Payer: Self-pay | Admitting: Family Medicine

## 2017-03-06 VITALS — BP 102/62 | HR 69 | Temp 98.0°F | Wt 213.7 lb

## 2017-03-06 DIAGNOSIS — Z0000 Encounter for general adult medical examination without abnormal findings: Secondary | ICD-10-CM

## 2017-03-06 DIAGNOSIS — Z23 Encounter for immunization: Secondary | ICD-10-CM | POA: Diagnosis not present

## 2017-03-06 NOTE — Patient Instructions (Signed)
Keep up the good work We will call you with Hep C antibody results.

## 2017-03-06 NOTE — Progress Notes (Signed)
Subjective:     Patient ID: Peter Snyder, male   DOB: 1955/10/03, 61 y.o.   MRN: [id number]  HPI Patient here for physical exam. He has multiple chronic problems as outlined below. has done well recently. Still walking regularly. No recent chest pains. Would like to get new shingles vaccine. He had previous shingles vaccine about 5 years ago. He had recent colonoscopy which showed adenomatous polyp with recommended 3 year follow-up  No documented history of HIV or hepatitis C antibody screening. 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 Flu vaccine already given. He's had previous Pneumovax  Past Medical History:  Diagnosis Date  . Arthritis   . CAD (coronary artery disease) 6/14   Cardiac catheterization 6/27/ 2014 ejection fraction 35-40%, 30% proximal left circumflex, 100% tiny obtuse marginal 1 with collaterals, 50% LAD, 50% D1, 100% RCA with collaterals  . Chicken pox   . Colon polyps   . Depression   . Diabetes mellitus ([place])    Diet controlled  . GERD (gastroesophageal reflux disease)   . Hyperlipidemia   . Hypertension   . Kidney stone    Past Surgical History:  Procedure Laterality Date  . TONSILLECTOMY  1963    reports that he has quit smoking. he has never used smokeless tobacco. He reports that he does not drink alcohol or use drugs. family history includes Arthritis in his father and paternal grandmother; Clotting disorder in his paternal grandfather and paternal grandmother; Colon polyps in his father; Heart disease in his maternal grandmother; Hyperlipidemia in his maternal grandmother; Liver cancer in his paternal grandfather; Melanoma in his mother. Allergies  Allergen Reactions  . Sulfa Antibiotics Rash     Review of Systems  Constitutional: Negative for activity change, appetite change, fatigue and fever.  HENT: Negative for congestion, ear pain and trouble swallowing.   Eyes: Negative for pain and visual  disturbance.  Respiratory: Negative for cough, shortness of breath and wheezing.   Cardiovascular: Negative for chest pain and palpitations.  Gastrointestinal: Negative for abdominal distention, abdominal pain, blood in stool, constipation, diarrhea, nausea, rectal pain and vomiting.  Genitourinary: Negative for dysuria, hematuria and testicular pain.  Musculoskeletal: Negative for arthralgias and joint swelling.  Skin: Negative for rash.  Neurological: Negative for dizziness, syncope and headaches.  Hematological: Negative for adenopathy.  Psychiatric/Behavioral: Negative for confusion and dysphoric mood.       Objective:   Physical Exam  Constitutional: He is oriented to person, place, and time. He appears well-developed and well-nourished. No distress.  HENT:  Head: Normocephalic and atraumatic.  Right Ear: External ear normal.  Left Ear: External ear normal.  Mouth/Throat: Oropharynx is clear and moist.  Eyes: Conjunctivae and EOM are normal. Pupils are equal, round, and reactive to light.  Neck: Normal range of motion. Neck supple. No thyromegaly present.  Cardiovascular: Normal rate, regular rhythm and normal heart sounds.  No murmur heard. Pulmonary/Chest: No respiratory distress. He has no wheezes. He has no rales.  Abdominal: Soft. Bowel sounds are normal. He exhibits no distension and no mass. There is no tenderness. There is no rebound and no guarding.  Musculoskeletal: He exhibits no edema.  Lymphadenopathy:    He has no cervical adenopathy.  Neurological: He is alert and oriented to person, place, and time. He displays normal reflexes. No cranial nerve deficit.  Skin: No rash noted.  Psychiatric: He has a normal mood and affect.       Assessment:  Physical exam. Following issues were addressed    Plan:     -Shingles vaccine given and recommended booster in 2-6 months -Check hepatitis C antibody and HIV antibody -Labs reviewed with patient with no major  concerns -Continue regular exercise habits  [name] MD [facility] at [facility]

## 2017-03-07 LAB — HEPATITIS C ANTIBODY
Hepatitis C Ab: NONREACTIVE
SIGNAL TO CUT-OFF: 0.01 (ref <1.00)

## 2017-03-07 LAB — HIV ANTIBODY (ROUTINE TESTING W REFLEX): HIV: NONREACTIVE

## 2017-03-08 ENCOUNTER — Encounter: Payer: Self-pay | Admitting: Family Medicine

## 2017-03-11 DIAGNOSIS — M50922 Unspecified cervical disc disorder at C5-C6 level: Secondary | ICD-10-CM | POA: Diagnosis not present

## 2017-03-11 DIAGNOSIS — M5136 Other intervertebral disc degeneration, lumbar region: Secondary | ICD-10-CM | POA: Diagnosis not present

## 2017-03-11 DIAGNOSIS — M50923 Unspecified cervical disc disorder at C6-C7 level: Secondary | ICD-10-CM | POA: Diagnosis not present

## 2017-03-11 DIAGNOSIS — M25511 Pain in right shoulder: Secondary | ICD-10-CM | POA: Diagnosis not present

## 2017-03-12 ENCOUNTER — Ambulatory Visit (INDEPENDENT_AMBULATORY_CARE_PROVIDER_SITE_OTHER): Payer: BLUE CROSS/BLUE SHIELD | Admitting: Family Medicine

## 2017-03-12 ENCOUNTER — Encounter: Payer: Self-pay | Admitting: Family Medicine

## 2017-03-12 VITALS — BP 100/60 | HR 69 | Temp 97.9°F | Ht 74.0 in | Wt 213.8 lb

## 2017-03-12 DIAGNOSIS — L299 Pruritus, unspecified: Secondary | ICD-10-CM

## 2017-03-12 NOTE — Patient Instructions (Signed)
Avoid anti-perspirants.

## 2017-03-12 NOTE — Progress Notes (Signed)
Subjective:     Patient ID: Peter Snyder, male   DOB: 1956-01-28, 61 y.o.   MRN: [id number]  HPI Patient has long history of very sensitive skin. He complains of some bilateral axillary pruritus past few days. No recent change of deodorant. He has not noted any rash present increased itching. Denies any other current areas of itching. No aggravating or alleviating factors. Symptoms are mild to moderate  Past Medical History:  Diagnosis Date  . Arthritis   . CAD (coronary artery disease) 6/14   Cardiac catheterization 6/27/ 2014 ejection fraction 35-40%, 30% proximal left circumflex, 100% tiny obtuse marginal 1 with collaterals, 50% LAD, 50% D1, 100% RCA with collaterals  . Chicken pox   . Colon polyps   . Depression   . Diabetes mellitus ([place])    Diet controlled  . GERD (gastroesophageal reflux disease)   . Hyperlipidemia   . Hypertension   . Kidney stone    Past Surgical History:  Procedure Laterality Date  . TONSILLECTOMY  1963    reports that he has quit smoking. he has never used smokeless tobacco. He reports that he does not drink alcohol or use drugs. family history includes Arthritis in his father and paternal grandmother; Clotting disorder in his paternal grandfather and paternal grandmother; Colon polyps in his father; Heart disease in his maternal grandmother; Hyperlipidemia in his maternal grandmother; Liver cancer in his paternal grandfather; Melanoma in his mother. Allergies  Allergen Reactions  . Sulfa Antibiotics Rash     Review of Systems  Skin: Negative for rash.  Hematological: Negative for adenopathy.       Objective:   Physical Exam  Constitutional: He appears well-developed and well-nourished.  Cardiovascular: Normal rate and regular rhythm.  Pulmonary/Chest: Effort normal and breath sounds normal. No respiratory distress. He has no wheezes. He has no rales.  Skin: No rash noted.  Axillary region examined. No rash. No erythema. No adenopathy        Assessment:     Pruritus involving axillary region with no rash     Plan:     -Avoid antiperspirant to axillary region -Consider trying a different deodorant -Follow-up for any rash or persistent symptoms  [name] MD [facility] at [facility]

## 2017-03-17 HISTORY — PX: TRIGGER FINGER RELEASE: SHX641

## 2017-03-19 DIAGNOSIS — M50923 Unspecified cervical disc disorder at C6-C7 level: Secondary | ICD-10-CM | POA: Diagnosis not present

## 2017-03-19 DIAGNOSIS — M50922 Unspecified cervical disc disorder at C5-C6 level: Secondary | ICD-10-CM | POA: Diagnosis not present

## 2017-03-19 DIAGNOSIS — M5136 Other intervertebral disc degeneration, lumbar region: Secondary | ICD-10-CM | POA: Diagnosis not present

## 2017-03-19 DIAGNOSIS — M25511 Pain in right shoulder: Secondary | ICD-10-CM | POA: Diagnosis not present

## 2017-03-23 DIAGNOSIS — M25511 Pain in right shoulder: Secondary | ICD-10-CM | POA: Diagnosis not present

## 2017-03-23 DIAGNOSIS — M7751 Other enthesopathy of right foot: Secondary | ICD-10-CM | POA: Diagnosis not present

## 2017-03-23 DIAGNOSIS — M50923 Unspecified cervical disc disorder at C6-C7 level: Secondary | ICD-10-CM | POA: Diagnosis not present

## 2017-03-23 DIAGNOSIS — M5136 Other intervertebral disc degeneration, lumbar region: Secondary | ICD-10-CM | POA: Diagnosis not present

## 2017-03-23 DIAGNOSIS — M50922 Unspecified cervical disc disorder at C5-C6 level: Secondary | ICD-10-CM | POA: Diagnosis not present

## 2017-03-23 DIAGNOSIS — G5761 Lesion of plantar nerve, right lower limb: Secondary | ICD-10-CM | POA: Diagnosis not present

## 2017-03-23 DIAGNOSIS — M19071 Primary osteoarthritis, right ankle and foot: Secondary | ICD-10-CM | POA: Diagnosis not present

## 2017-03-23 DIAGNOSIS — Z713 Dietary counseling and surveillance: Secondary | ICD-10-CM | POA: Diagnosis not present

## 2017-03-25 DIAGNOSIS — M50923 Unspecified cervical disc disorder at C6-C7 level: Secondary | ICD-10-CM | POA: Diagnosis not present

## 2017-03-25 DIAGNOSIS — M25511 Pain in right shoulder: Secondary | ICD-10-CM | POA: Diagnosis not present

## 2017-03-25 DIAGNOSIS — M50922 Unspecified cervical disc disorder at C5-C6 level: Secondary | ICD-10-CM | POA: Diagnosis not present

## 2017-03-25 DIAGNOSIS — M5136 Other intervertebral disc degeneration, lumbar region: Secondary | ICD-10-CM | POA: Diagnosis not present

## 2017-03-28 DIAGNOSIS — M6530 Trigger finger, unspecified finger: Secondary | ICD-10-CM | POA: Insufficient documentation

## 2017-04-01 DIAGNOSIS — M50923 Unspecified cervical disc disorder at C6-C7 level: Secondary | ICD-10-CM | POA: Diagnosis not present

## 2017-04-01 DIAGNOSIS — M25511 Pain in right shoulder: Secondary | ICD-10-CM | POA: Diagnosis not present

## 2017-04-01 DIAGNOSIS — M50922 Unspecified cervical disc disorder at C5-C6 level: Secondary | ICD-10-CM | POA: Diagnosis not present

## 2017-04-01 DIAGNOSIS — M5136 Other intervertebral disc degeneration, lumbar region: Secondary | ICD-10-CM | POA: Diagnosis not present

## 2017-04-01 DIAGNOSIS — F411 Generalized anxiety disorder: Secondary | ICD-10-CM | POA: Diagnosis not present

## 2017-04-06 DIAGNOSIS — M7752 Other enthesopathy of left foot: Secondary | ICD-10-CM | POA: Diagnosis not present

## 2017-04-06 DIAGNOSIS — M50922 Unspecified cervical disc disorder at C5-C6 level: Secondary | ICD-10-CM | POA: Diagnosis not present

## 2017-04-06 DIAGNOSIS — M25572 Pain in left ankle and joints of left foot: Secondary | ICD-10-CM | POA: Diagnosis not present

## 2017-04-06 DIAGNOSIS — M5136 Other intervertebral disc degeneration, lumbar region: Secondary | ICD-10-CM | POA: Diagnosis not present

## 2017-04-06 DIAGNOSIS — M50923 Unspecified cervical disc disorder at C6-C7 level: Secondary | ICD-10-CM | POA: Diagnosis not present

## 2017-04-06 DIAGNOSIS — Z713 Dietary counseling and surveillance: Secondary | ICD-10-CM | POA: Diagnosis not present

## 2017-04-06 DIAGNOSIS — M25511 Pain in right shoulder: Secondary | ICD-10-CM | POA: Diagnosis not present

## 2017-04-08 DIAGNOSIS — F411 Generalized anxiety disorder: Secondary | ICD-10-CM | POA: Diagnosis not present

## 2017-04-09 DIAGNOSIS — M50922 Unspecified cervical disc disorder at C5-C6 level: Secondary | ICD-10-CM | POA: Diagnosis not present

## 2017-04-09 DIAGNOSIS — M50923 Unspecified cervical disc disorder at C6-C7 level: Secondary | ICD-10-CM | POA: Diagnosis not present

## 2017-04-09 DIAGNOSIS — M25511 Pain in right shoulder: Secondary | ICD-10-CM | POA: Diagnosis not present

## 2017-04-09 DIAGNOSIS — M5136 Other intervertebral disc degeneration, lumbar region: Secondary | ICD-10-CM | POA: Diagnosis not present

## 2017-04-15 DIAGNOSIS — M50922 Unspecified cervical disc disorder at C5-C6 level: Secondary | ICD-10-CM | POA: Diagnosis not present

## 2017-04-15 DIAGNOSIS — M25511 Pain in right shoulder: Secondary | ICD-10-CM | POA: Diagnosis not present

## 2017-04-15 DIAGNOSIS — M50923 Unspecified cervical disc disorder at C6-C7 level: Secondary | ICD-10-CM | POA: Diagnosis not present

## 2017-04-15 DIAGNOSIS — M5136 Other intervertebral disc degeneration, lumbar region: Secondary | ICD-10-CM | POA: Diagnosis not present

## 2017-04-16 DIAGNOSIS — M79641 Pain in right hand: Secondary | ICD-10-CM | POA: Diagnosis not present

## 2017-04-16 DIAGNOSIS — M65312 Trigger thumb, left thumb: Secondary | ICD-10-CM | POA: Diagnosis not present

## 2017-04-20 DIAGNOSIS — M50922 Unspecified cervical disc disorder at C5-C6 level: Secondary | ICD-10-CM | POA: Diagnosis not present

## 2017-04-20 DIAGNOSIS — M25511 Pain in right shoulder: Secondary | ICD-10-CM | POA: Diagnosis not present

## 2017-04-20 DIAGNOSIS — M50923 Unspecified cervical disc disorder at C6-C7 level: Secondary | ICD-10-CM | POA: Diagnosis not present

## 2017-04-20 DIAGNOSIS — M5136 Other intervertebral disc degeneration, lumbar region: Secondary | ICD-10-CM | POA: Diagnosis not present

## 2017-04-21 ENCOUNTER — Other Ambulatory Visit: Payer: Self-pay | Admitting: Family Medicine

## 2017-04-22 DIAGNOSIS — F411 Generalized anxiety disorder: Secondary | ICD-10-CM | POA: Diagnosis not present

## 2017-04-23 ENCOUNTER — Ambulatory Visit: Payer: BLUE CROSS/BLUE SHIELD | Admitting: Family Medicine

## 2017-04-23 ENCOUNTER — Encounter: Payer: Self-pay | Admitting: Family Medicine

## 2017-04-23 VITALS — BP 98/52 | HR 68 | Temp 97.4°F | Wt 211.8 lb

## 2017-04-23 DIAGNOSIS — R300 Dysuria: Secondary | ICD-10-CM | POA: Diagnosis not present

## 2017-04-23 LAB — POCT URINALYSIS DIPSTICK
Bilirubin, UA: NEGATIVE
Blood, UA: NEGATIVE
Glucose, UA: NEGATIVE
Ketones, UA: NEGATIVE
LEUKOCYTES UA: NEGATIVE
NITRITE UA: NEGATIVE
PROTEIN UA: NEGATIVE
Spec Grav, UA: >=1.03 — AB (ref 1.010–1.025)
Urobilinogen, UA: 0.2 E.U./dL
pH, UA: 6 (ref 5.0–8.0)

## 2017-04-23 MED ORDER — TAMSULOSIN HCL 0.4 MG PO CAPS: 0.8000 mg | ORAL_CAPSULE | Freq: Every day | ORAL | 3 refills | Status: DC

## 2017-04-23 NOTE — Addendum Note (Signed)
Addended by: Alysia Penna A on: 04/23/2017 11:11 AM   Modules accepted: Orders

## 2017-04-23 NOTE — Progress Notes (Signed)
   Subjective:    Patient ID: Peter Snyder, male    DOB: 1955/11/30, 62 y.o.   MRN: [id number]  HPI Here for a spell of pelvic cramping yesterday that lasted about 2 minutes. He says it felt like his bladder was having spasms. No burning on urination, no urgency. No blood or DC seen. No back pain. He takes Flomax 0.4 mg daily. He tries to drink plenty of water.    Review of Systems  Constitutional: Negative.   Respiratory: Negative.   Cardiovascular: Negative.   Gastrointestinal: Positive for abdominal pain. Negative for abdominal distention, anal bleeding, blood in stool, constipation, diarrhea, nausea, rectal pain and vomiting.  Genitourinary: Negative.        Objective:   Physical Exam  Constitutional: He appears well-developed and well-nourished.  Cardiovascular: Normal rate, regular rhythm, normal heart sounds and intact distal pulses.  Pulmonary/Chest: Effort normal and breath sounds normal. No respiratory distress. He has no wheezes. He has no rales.  Abdominal: Soft. Bowel sounds are normal. He exhibits no distension and no mass. There is no tenderness. There is no rebound and no guarding.  Genitourinary: Prostate normal.  Genitourinary Comments: Prostate is not swollen and not tender           Assessment & Plan:  Bladder spasms. He will increase the Flomax to a total of 0.8 mg (2 pills) daily. Recheck prn.  Alysia Penna, MD

## 2017-04-27 DIAGNOSIS — M50923 Unspecified cervical disc disorder at C6-C7 level: Secondary | ICD-10-CM | POA: Diagnosis not present

## 2017-04-27 DIAGNOSIS — M50922 Unspecified cervical disc disorder at C5-C6 level: Secondary | ICD-10-CM | POA: Diagnosis not present

## 2017-04-27 DIAGNOSIS — M25511 Pain in right shoulder: Secondary | ICD-10-CM | POA: Diagnosis not present

## 2017-04-27 DIAGNOSIS — M5136 Other intervertebral disc degeneration, lumbar region: Secondary | ICD-10-CM | POA: Diagnosis not present

## 2017-04-28 DIAGNOSIS — M79644 Pain in right finger(s): Secondary | ICD-10-CM | POA: Diagnosis not present

## 2017-04-30 DIAGNOSIS — M50922 Unspecified cervical disc disorder at C5-C6 level: Secondary | ICD-10-CM | POA: Diagnosis not present

## 2017-04-30 DIAGNOSIS — M25511 Pain in right shoulder: Secondary | ICD-10-CM | POA: Diagnosis not present

## 2017-04-30 DIAGNOSIS — M50923 Unspecified cervical disc disorder at C6-C7 level: Secondary | ICD-10-CM | POA: Diagnosis not present

## 2017-04-30 DIAGNOSIS — M5136 Other intervertebral disc degeneration, lumbar region: Secondary | ICD-10-CM | POA: Diagnosis not present

## 2017-05-04 DIAGNOSIS — M5136 Other intervertebral disc degeneration, lumbar region: Secondary | ICD-10-CM | POA: Diagnosis not present

## 2017-05-04 DIAGNOSIS — M25511 Pain in right shoulder: Secondary | ICD-10-CM | POA: Diagnosis not present

## 2017-05-04 DIAGNOSIS — M50922 Unspecified cervical disc disorder at C5-C6 level: Secondary | ICD-10-CM | POA: Diagnosis not present

## 2017-05-04 DIAGNOSIS — M50923 Unspecified cervical disc disorder at C6-C7 level: Secondary | ICD-10-CM | POA: Diagnosis not present

## 2017-05-06 DIAGNOSIS — F411 Generalized anxiety disorder: Secondary | ICD-10-CM | POA: Diagnosis not present

## 2017-05-06 DIAGNOSIS — M25511 Pain in right shoulder: Secondary | ICD-10-CM | POA: Diagnosis not present

## 2017-05-06 DIAGNOSIS — M5136 Other intervertebral disc degeneration, lumbar region: Secondary | ICD-10-CM | POA: Diagnosis not present

## 2017-05-06 DIAGNOSIS — M50923 Unspecified cervical disc disorder at C6-C7 level: Secondary | ICD-10-CM | POA: Diagnosis not present

## 2017-05-06 DIAGNOSIS — M50922 Unspecified cervical disc disorder at C5-C6 level: Secondary | ICD-10-CM | POA: Diagnosis not present

## 2017-05-08 ENCOUNTER — Other Ambulatory Visit: Payer: Self-pay | Admitting: Family Medicine

## 2017-05-08 DIAGNOSIS — M65332 Trigger finger, left middle finger: Secondary | ICD-10-CM | POA: Insufficient documentation

## 2017-05-08 DIAGNOSIS — M79645 Pain in left finger(s): Secondary | ICD-10-CM | POA: Insufficient documentation

## 2017-05-11 ENCOUNTER — Encounter: Payer: Self-pay | Admitting: Family Medicine

## 2017-05-11 ENCOUNTER — Ambulatory Visit: Payer: BLUE CROSS/BLUE SHIELD | Admitting: Family Medicine

## 2017-05-11 VITALS — BP 94/64 | HR 64 | Temp 97.9°F | Wt 216.1 lb

## 2017-05-11 DIAGNOSIS — L299 Pruritus, unspecified: Secondary | ICD-10-CM | POA: Diagnosis not present

## 2017-05-11 DIAGNOSIS — M5412 Radiculopathy, cervical region: Secondary | ICD-10-CM | POA: Diagnosis not present

## 2017-05-11 DIAGNOSIS — M47812 Spondylosis without myelopathy or radiculopathy, cervical region: Secondary | ICD-10-CM | POA: Diagnosis not present

## 2017-05-11 DIAGNOSIS — Z713 Dietary counseling and surveillance: Secondary | ICD-10-CM | POA: Diagnosis not present

## 2017-05-11 NOTE — Patient Instructions (Signed)
Consider cool/cold compresses to see if this provides some relief. Follow up for any rashes.

## 2017-05-11 NOTE — Progress Notes (Signed)
Subjective:     Patient ID: Peter Snyder, male   DOB: 1955/04/04, 62 y.o.   MRN: [id number]  HPI Patient seen with mostly itching left side. He first noted about a week ago. No rash. He has some mild irritation and discomfort but mostly itching. He was concerned he may have some early shingles. He has had previous shingles vaccine. He tried over-the-counter topical hydrocortisone without relief. Symptoms are relatively mild.  Past Medical History:  Diagnosis Date  . Arthritis   . CAD (coronary artery disease) 6/14   Cardiac catheterization 6/27/ 2014 ejection fraction 35-40%, 30% proximal left circumflex, 100% tiny obtuse marginal 1 with collaterals, 50% LAD, 50% D1, 100% RCA with collaterals  . Chicken pox   . Colon polyps   . Depression   . Diabetes mellitus ([place])    Diet controlled  . GERD (gastroesophageal reflux disease)   . Hyperlipidemia   . Hypertension   . Kidney stone    Past Surgical History:  Procedure Laterality Date  . TONSILLECTOMY  1963    reports that he has quit smoking. he has never used smokeless tobacco. He reports that he does not drink alcohol or use drugs. family history includes Arthritis in his father and paternal grandmother; Clotting disorder in his paternal grandfather and paternal grandmother; Colon polyps in his father; Heart disease in his maternal grandmother; Hyperlipidemia in his maternal grandmother; Liver cancer in his paternal grandfather; Melanoma in his mother. Allergies  Allergen Reactions  . Sulfa Antibiotics Rash     Review of Systems  Constitutional: Negative for chills and fever.  Skin: Negative for rash.       Objective:   Physical Exam  Constitutional: He appears well-developed and well-nourished.  Cardiovascular: Normal rate and regular rhythm.  Pulmonary/Chest: Effort normal and breath sounds normal. No respiratory distress. He has no wheezes. He has no rales.  Skin: No rash noted.       Assessment:     Patient with  pruritus left midthoracic region without rash. Doubt shingles    Plan:     -Recommend cool or cold compresses -Follow-up immediately for any rash or change of symptoms otherwise -this could be neuropathic related pruritis but already on Gabapentin.  [name] MD [facility] at [facility]

## 2017-05-12 ENCOUNTER — Encounter: Payer: Self-pay | Admitting: *Deleted

## 2017-05-12 DIAGNOSIS — M50922 Unspecified cervical disc disorder at C5-C6 level: Secondary | ICD-10-CM | POA: Diagnosis not present

## 2017-05-12 DIAGNOSIS — M50923 Unspecified cervical disc disorder at C6-C7 level: Secondary | ICD-10-CM | POA: Diagnosis not present

## 2017-05-12 DIAGNOSIS — M25511 Pain in right shoulder: Secondary | ICD-10-CM | POA: Diagnosis not present

## 2017-05-12 DIAGNOSIS — M5136 Other intervertebral disc degeneration, lumbar region: Secondary | ICD-10-CM | POA: Diagnosis not present

## 2017-05-13 ENCOUNTER — Other Ambulatory Visit: Payer: Self-pay | Admitting: *Deleted

## 2017-05-13 DIAGNOSIS — I712 Thoracic aortic aneurysm, without rupture, unspecified: Secondary | ICD-10-CM

## 2017-05-14 DIAGNOSIS — M25511 Pain in right shoulder: Secondary | ICD-10-CM | POA: Diagnosis not present

## 2017-05-14 DIAGNOSIS — M50923 Unspecified cervical disc disorder at C6-C7 level: Secondary | ICD-10-CM | POA: Diagnosis not present

## 2017-05-14 DIAGNOSIS — M5136 Other intervertebral disc degeneration, lumbar region: Secondary | ICD-10-CM | POA: Diagnosis not present

## 2017-05-14 DIAGNOSIS — M50922 Unspecified cervical disc disorder at C5-C6 level: Secondary | ICD-10-CM | POA: Diagnosis not present

## 2017-05-18 DIAGNOSIS — L859 Epidermal thickening, unspecified: Secondary | ICD-10-CM | POA: Diagnosis not present

## 2017-05-18 DIAGNOSIS — L309 Dermatitis, unspecified: Secondary | ICD-10-CM | POA: Diagnosis not present

## 2017-05-18 DIAGNOSIS — L820 Inflamed seborrheic keratosis: Secondary | ICD-10-CM | POA: Diagnosis not present

## 2017-05-18 DIAGNOSIS — L700 Acne vulgaris: Secondary | ICD-10-CM | POA: Diagnosis not present

## 2017-05-18 DIAGNOSIS — L570 Actinic keratosis: Secondary | ICD-10-CM | POA: Diagnosis not present

## 2017-05-18 DIAGNOSIS — L299 Pruritus, unspecified: Secondary | ICD-10-CM | POA: Diagnosis not present

## 2017-05-19 DIAGNOSIS — M50923 Unspecified cervical disc disorder at C6-C7 level: Secondary | ICD-10-CM | POA: Diagnosis not present

## 2017-05-19 DIAGNOSIS — M50922 Unspecified cervical disc disorder at C5-C6 level: Secondary | ICD-10-CM | POA: Diagnosis not present

## 2017-05-19 DIAGNOSIS — M25511 Pain in right shoulder: Secondary | ICD-10-CM | POA: Diagnosis not present

## 2017-05-19 DIAGNOSIS — M5136 Other intervertebral disc degeneration, lumbar region: Secondary | ICD-10-CM | POA: Diagnosis not present

## 2017-05-21 ENCOUNTER — Other Ambulatory Visit: Payer: BLUE CROSS/BLUE SHIELD

## 2017-05-21 DIAGNOSIS — M5136 Other intervertebral disc degeneration, lumbar region: Secondary | ICD-10-CM | POA: Diagnosis not present

## 2017-05-21 DIAGNOSIS — F411 Generalized anxiety disorder: Secondary | ICD-10-CM | POA: Diagnosis not present

## 2017-05-21 DIAGNOSIS — M25511 Pain in right shoulder: Secondary | ICD-10-CM | POA: Diagnosis not present

## 2017-05-21 DIAGNOSIS — M50922 Unspecified cervical disc disorder at C5-C6 level: Secondary | ICD-10-CM | POA: Diagnosis not present

## 2017-05-21 DIAGNOSIS — M50923 Unspecified cervical disc disorder at C6-C7 level: Secondary | ICD-10-CM | POA: Diagnosis not present

## 2017-05-22 DIAGNOSIS — M5033 Other cervical disc degeneration, cervicothoracic region: Secondary | ICD-10-CM | POA: Diagnosis not present

## 2017-05-22 DIAGNOSIS — M5010 Cervical disc disorder with radiculopathy, unspecified cervical region: Secondary | ICD-10-CM | POA: Diagnosis not present

## 2017-05-22 DIAGNOSIS — M5412 Radiculopathy, cervical region: Secondary | ICD-10-CM | POA: Diagnosis not present

## 2017-05-22 DIAGNOSIS — M542 Cervicalgia: Secondary | ICD-10-CM | POA: Diagnosis not present

## 2017-05-24 ENCOUNTER — Ambulatory Visit
Admission: RE | Admit: 2017-05-24 | Discharge: 2017-05-24 | Disposition: A | Discharge status: Home / Self Care | Payer: BLUE CROSS/BLUE SHIELD | Source: Ambulatory Visit | Attending: Cardiology | Admitting: Cardiology

## 2017-05-24 DIAGNOSIS — I712 Thoracic aortic aneurysm, without rupture, unspecified: Secondary | ICD-10-CM

## 2017-05-24 MED ORDER — GADOBENATE DIMEGLUMINE 529 MG/ML IV SOLN
19.0000 mL | Freq: Once | INTRAVENOUS | Status: AC | PRN
  Administered 2017-05-24: 19 mL via INTRAVENOUS

## 2017-05-25 DIAGNOSIS — M5136 Other intervertebral disc degeneration, lumbar region: Secondary | ICD-10-CM | POA: Insufficient documentation

## 2017-05-25 DIAGNOSIS — M50923 Unspecified cervical disc disorder at C6-C7 level: Secondary | ICD-10-CM | POA: Diagnosis not present

## 2017-05-25 DIAGNOSIS — M25511 Pain in right shoulder: Secondary | ICD-10-CM | POA: Diagnosis not present

## 2017-05-25 DIAGNOSIS — M50922 Unspecified cervical disc disorder at C5-C6 level: Secondary | ICD-10-CM | POA: Diagnosis not present

## 2017-05-26 ENCOUNTER — Encounter: Payer: Self-pay | Admitting: Cardiology

## 2017-05-26 ENCOUNTER — Ambulatory Visit: Payer: BLUE CROSS/BLUE SHIELD | Admitting: Family Medicine

## 2017-05-26 DIAGNOSIS — M545 Low back pain: Secondary | ICD-10-CM | POA: Diagnosis not present

## 2017-05-26 MED ORDER — TRAMADOL HCL 50 MG PO TABS: 50.0000 mg | ORAL_TABLET | Freq: Four times a day (QID) | ORAL | 0 refills | Status: DC | PRN

## 2017-05-26 NOTE — Progress Notes (Signed)
Subjective:     Patient ID: Peter Snyder, male   DOB: 08-25-1955, 62 y.o.   MRN: [id number]  HPI Patient seen with right lower back pain radiating anterior toward the hip. Onset about 2 weeks. He did some community work with some lifting but no specific injury. He describes pain as relatively constant but worse at times. Sharp pain which radiates toward the hip. No radiculitis symptoms. No numbness. No weakness. No urine or stool incontinence. He's taken Celebrex and Tylenol without much relief. He went to physical therapy last week and had some dry needling without improvement. He had epidural in the neck Friday and mentioned this to his orthopedist and apparently he is being scheduled for epidural lumbar spine. He's had previous MRI scan but this was several years ago.  Past Medical History:  Diagnosis Date  . Arthritis   . CAD (coronary artery disease) 6/14   Cardiac catheterization 6/27/ 2014 ejection fraction 35-40%, 30% proximal left circumflex, 100% tiny obtuse marginal 1 with collaterals, 50% LAD, 50% D1, 100% RCA with collaterals  . Chicken pox   . Colon polyps   . Depression   . Diabetes mellitus ([place])    Diet controlled  . GERD (gastroesophageal reflux disease)   . Hyperlipidemia   . Hypertension   . Kidney stone    Past Surgical History:  Procedure Laterality Date  . TONSILLECTOMY  1963    reports that he has quit smoking. he has never used smokeless tobacco. He reports that he does not drink alcohol or use drugs. family history includes Arthritis in his father and paternal grandmother; Clotting disorder in his paternal grandfather and paternal grandmother; Colon polyps in his father; Heart disease in his maternal grandmother; Hyperlipidemia in his maternal grandmother; Liver cancer in his paternal grandfather; Melanoma in his mother. Allergies  Allergen Reactions  . Sulfa Antibiotics Rash     Review of Systems  Constitutional: Negative for activity change, appetite  change and fever.  Respiratory: Negative for cough and shortness of breath.   Cardiovascular: Negative for chest pain and leg swelling.  Gastrointestinal: Negative for abdominal pain and vomiting.  Genitourinary: Negative for dysuria, flank pain and hematuria.  Musculoskeletal: Positive for back pain. Negative for joint swelling.  Neurological: Negative for weakness and numbness.       Objective:   Physical Exam  Constitutional: He is oriented to person, place, and time. He appears well-developed and well-nourished. No distress.  Neck: No thyromegaly present.  Cardiovascular: Normal rate, regular rhythm and normal heart sounds.  No murmur heard. Pulmonary/Chest: Effort normal and breath sounds normal. No respiratory distress. He has no wheezes. He has no rales.  Musculoskeletal: He exhibits no edema.  Negative straight leg raise on the right. No edema Excellent range of motion right hip  Neurological: He is alert and oriented to person, place, and time. He has normal reflexes. No cranial nerve deficit.  Full strength with knee extension, plantar flexion, dorsiflexion on the right  Skin: No rash noted.       Assessment:     Right lower lumbar back pain. He describes some radiation toward the hip. Nonfocal neuro exam     Plan:     -Limited tramadol 50 mg one every 6 hours as they for severe pain -Continue walking as tolerated -Continue Celebrex as needed -He has pending referral to orthopedist as above  [name] MD [facility] at [facility]

## 2017-05-26 NOTE — Patient Instructions (Signed)

## 2017-05-28 DIAGNOSIS — M25511 Pain in right shoulder: Secondary | ICD-10-CM | POA: Diagnosis not present

## 2017-05-28 DIAGNOSIS — M50923 Unspecified cervical disc disorder at C6-C7 level: Secondary | ICD-10-CM | POA: Diagnosis not present

## 2017-05-28 DIAGNOSIS — M5136 Other intervertebral disc degeneration, lumbar region: Secondary | ICD-10-CM | POA: Diagnosis not present

## 2017-05-28 DIAGNOSIS — M50922 Unspecified cervical disc disorder at C5-C6 level: Secondary | ICD-10-CM | POA: Diagnosis not present

## 2017-06-01 DIAGNOSIS — M50922 Unspecified cervical disc disorder at C5-C6 level: Secondary | ICD-10-CM | POA: Diagnosis not present

## 2017-06-01 DIAGNOSIS — M25511 Pain in right shoulder: Secondary | ICD-10-CM | POA: Diagnosis not present

## 2017-06-01 DIAGNOSIS — M50923 Unspecified cervical disc disorder at C6-C7 level: Secondary | ICD-10-CM | POA: Diagnosis not present

## 2017-06-01 DIAGNOSIS — M5136 Other intervertebral disc degeneration, lumbar region: Secondary | ICD-10-CM | POA: Diagnosis not present

## 2017-06-02 DIAGNOSIS — M5030 Other cervical disc degeneration, unspecified cervical region: Secondary | ICD-10-CM | POA: Insufficient documentation

## 2017-06-04 DIAGNOSIS — M50922 Unspecified cervical disc disorder at C5-C6 level: Secondary | ICD-10-CM | POA: Diagnosis not present

## 2017-06-04 DIAGNOSIS — M25511 Pain in right shoulder: Secondary | ICD-10-CM | POA: Diagnosis not present

## 2017-06-04 DIAGNOSIS — M50923 Unspecified cervical disc disorder at C6-C7 level: Secondary | ICD-10-CM | POA: Diagnosis not present

## 2017-06-04 DIAGNOSIS — M5136 Other intervertebral disc degeneration, lumbar region: Secondary | ICD-10-CM | POA: Diagnosis not present

## 2017-06-09 DIAGNOSIS — Z713 Dietary counseling and surveillance: Secondary | ICD-10-CM | POA: Diagnosis not present

## 2017-06-10 DIAGNOSIS — M25511 Pain in right shoulder: Secondary | ICD-10-CM | POA: Diagnosis not present

## 2017-06-10 DIAGNOSIS — M50923 Unspecified cervical disc disorder at C6-C7 level: Secondary | ICD-10-CM | POA: Diagnosis not present

## 2017-06-10 DIAGNOSIS — M50922 Unspecified cervical disc disorder at C5-C6 level: Secondary | ICD-10-CM | POA: Diagnosis not present

## 2017-06-10 DIAGNOSIS — M5136 Other intervertebral disc degeneration, lumbar region: Secondary | ICD-10-CM | POA: Diagnosis not present

## 2017-06-11 ENCOUNTER — Telehealth: Payer: Self-pay | Admitting: *Deleted

## 2017-06-11 DIAGNOSIS — F411 Generalized anxiety disorder: Secondary | ICD-10-CM | POA: Diagnosis not present

## 2017-06-11 NOTE — Telephone Encounter (Signed)
Prior auth for Tramadol sent to [id number].  Approval was given and I called [facility] and informed of this.

## 2017-06-12 DIAGNOSIS — M5412 Radiculopathy, cervical region: Secondary | ICD-10-CM | POA: Diagnosis not present

## 2017-06-12 DIAGNOSIS — M5417 Radiculopathy, lumbosacral region: Secondary | ICD-10-CM | POA: Diagnosis not present

## 2017-06-12 DIAGNOSIS — M542 Cervicalgia: Secondary | ICD-10-CM | POA: Diagnosis not present

## 2017-06-15 DIAGNOSIS — M50923 Unspecified cervical disc disorder at C6-C7 level: Secondary | ICD-10-CM | POA: Diagnosis not present

## 2017-06-15 DIAGNOSIS — M5136 Other intervertebral disc degeneration, lumbar region: Secondary | ICD-10-CM | POA: Diagnosis not present

## 2017-06-15 DIAGNOSIS — M50922 Unspecified cervical disc disorder at C5-C6 level: Secondary | ICD-10-CM | POA: Diagnosis not present

## 2017-06-15 DIAGNOSIS — M25511 Pain in right shoulder: Secondary | ICD-10-CM | POA: Diagnosis not present

## 2017-06-18 DIAGNOSIS — M50922 Unspecified cervical disc disorder at C5-C6 level: Secondary | ICD-10-CM | POA: Diagnosis not present

## 2017-06-18 DIAGNOSIS — M5136 Other intervertebral disc degeneration, lumbar region: Secondary | ICD-10-CM | POA: Diagnosis not present

## 2017-06-18 DIAGNOSIS — M25511 Pain in right shoulder: Secondary | ICD-10-CM | POA: Diagnosis not present

## 2017-06-18 DIAGNOSIS — M50923 Unspecified cervical disc disorder at C6-C7 level: Secondary | ICD-10-CM | POA: Diagnosis not present

## 2017-06-22 DIAGNOSIS — M25511 Pain in right shoulder: Secondary | ICD-10-CM | POA: Diagnosis not present

## 2017-06-22 DIAGNOSIS — M50923 Unspecified cervical disc disorder at C6-C7 level: Secondary | ICD-10-CM | POA: Diagnosis not present

## 2017-06-22 DIAGNOSIS — M5136 Other intervertebral disc degeneration, lumbar region: Secondary | ICD-10-CM | POA: Diagnosis not present

## 2017-06-22 DIAGNOSIS — M50922 Unspecified cervical disc disorder at C5-C6 level: Secondary | ICD-10-CM | POA: Diagnosis not present

## 2017-06-24 ENCOUNTER — Ambulatory Visit (INDEPENDENT_AMBULATORY_CARE_PROVIDER_SITE_OTHER): Payer: BLUE CROSS/BLUE SHIELD | Admitting: Family Medicine

## 2017-06-24 ENCOUNTER — Encounter: Payer: Self-pay | Admitting: Family Medicine

## 2017-06-24 VITALS — BP 98/56 | HR 62 | Temp 97.6°F | Ht 74.0 in | Wt 215.6 lb

## 2017-06-24 DIAGNOSIS — J019 Acute sinusitis, unspecified: Secondary | ICD-10-CM

## 2017-06-24 MED ORDER — AMOXICILLIN-POT CLAVULANATE 875-125 MG PO TABS: 1.0000 | ORAL_TABLET | Freq: Two times a day (BID) | ORAL | 0 refills | Status: DC

## 2017-06-24 NOTE — Progress Notes (Signed)
   Subjective:    Patient ID: Gloriann Loan, male    DOB: 12-08-1955, 62 y.o.   MRN: [id number]  HPI Here for 2 days of sinus pressure, PND, and ST. No cough. On Mucinex and Flonase.   Review of Systems  Constitutional: Negative.   HENT: Positive for congestion, postnasal drip, sinus pressure, sinus pain and sore throat.   Eyes: Negative.   Respiratory: Negative.        Objective:   Physical Exam  Constitutional: He appears well-developed and well-nourished.  HENT:  Right Ear: External ear normal.  Left Ear: External ear normal.  Nose: Nose normal.  Mouth/Throat: Oropharynx is clear and moist.  Eyes: Conjunctivae are normal.  Neck: No thyromegaly present.  Pulmonary/Chest: Effort normal and breath sounds normal. No respiratory distress. He has no wheezes. He has no rales.  Lymphadenopathy:    He has no cervical adenopathy.          Assessment & Plan:  Sinusitis, treat with Augmentin. Alysia Penna, MD

## 2017-06-25 DIAGNOSIS — G894 Chronic pain syndrome: Secondary | ICD-10-CM | POA: Diagnosis not present

## 2017-06-25 DIAGNOSIS — M47816 Spondylosis without myelopathy or radiculopathy, lumbar region: Secondary | ICD-10-CM | POA: Diagnosis not present

## 2017-06-25 DIAGNOSIS — M542 Cervicalgia: Secondary | ICD-10-CM | POA: Diagnosis not present

## 2017-06-26 ENCOUNTER — Encounter: Payer: Self-pay | Admitting: Family Medicine

## 2017-06-26 ENCOUNTER — Other Ambulatory Visit: Payer: Self-pay | Admitting: *Deleted

## 2017-06-26 ENCOUNTER — Other Ambulatory Visit: Payer: Self-pay | Admitting: Family Medicine

## 2017-06-26 NOTE — Telephone Encounter (Signed)
Left message on machine for patient to return our call.  We received a refill request from [facility].  Would patient like mailorder or local pharmacy?  CRM created

## 2017-06-29 DIAGNOSIS — M5136 Other intervertebral disc degeneration, lumbar region: Secondary | ICD-10-CM | POA: Diagnosis not present

## 2017-06-29 DIAGNOSIS — M50922 Unspecified cervical disc disorder at C5-C6 level: Secondary | ICD-10-CM | POA: Diagnosis not present

## 2017-06-29 DIAGNOSIS — M25511 Pain in right shoulder: Secondary | ICD-10-CM | POA: Diagnosis not present

## 2017-06-29 DIAGNOSIS — M50923 Unspecified cervical disc disorder at C6-C7 level: Secondary | ICD-10-CM | POA: Diagnosis not present

## 2017-06-29 NOTE — Telephone Encounter (Signed)
Try to finish the Augmentin

## 2017-07-01 ENCOUNTER — Ambulatory Visit (INDEPENDENT_AMBULATORY_CARE_PROVIDER_SITE_OTHER): Payer: BLUE CROSS/BLUE SHIELD | Admitting: Family Medicine

## 2017-07-01 ENCOUNTER — Encounter: Payer: Self-pay | Admitting: Family Medicine

## 2017-07-01 VITALS — BP 100/60 | HR 58 | Temp 98.3°F | Wt 215.3 lb

## 2017-07-01 DIAGNOSIS — R0981 Nasal congestion: Secondary | ICD-10-CM

## 2017-07-01 DIAGNOSIS — F411 Generalized anxiety disorder: Secondary | ICD-10-CM | POA: Diagnosis not present

## 2017-07-01 MED ORDER — MONTELUKAST SODIUM 10 MG PO TABS: 10.0000 mg | ORAL_TABLET | Freq: Every day | ORAL | 3 refills | Status: DC

## 2017-07-01 NOTE — Progress Notes (Signed)
Subjective:     Patient ID: Peter Snyder, male   DOB: January 05, 1956, 62 y.o.   MRN: [id number]  HPI Patient seen with persistent sinus congestive symptoms and sore throat. He was seen here last week and started on Augmentin. However, he develops some stomach upset and stop after 4 days. His GI symptoms have improved. He had some sneezing, nasal congestion and postnasal drip. ER he takes nasal steroid and oral antihistamine.  Denies any fever or chills. No headaches. No consistent purulence secretions. Mostly clear discharge  Review of Systems  Constitutional: Negative for chills and fever.  HENT: Positive for congestion, postnasal drip and sore throat.        Objective:   Physical Exam  Constitutional: He appears well-developed and well-nourished.  HENT:  Right Ear: External ear normal.  Left Ear: External ear normal.  Mouth/Throat: Oropharynx is clear and moist.  Nonspecific mild erythematous changes nasal mucosa bilaterally. Minimally yellow tends to mucus left naris  Neck: Neck supple.  Cardiovascular: Normal rate.  Pulmonary/Chest: Effort normal and breath sounds normal. No respiratory distress. He has no wheezes. He has no rales.  Lymphadenopathy:    He has no cervical adenopathy.       Assessment:     Rhinitis. Suspect largely allergic    Plan:     -Continue nasal steroid over-the-counter -Continue oral antihistamine -Consider trial Singulair 10 mg once daily -Touch base of symptoms not improving in 2-3 weeks  [name] MD [facility] at [facility]

## 2017-07-03 ENCOUNTER — Encounter: Payer: Self-pay | Admitting: Family Medicine

## 2017-07-06 ENCOUNTER — Other Ambulatory Visit: Payer: Self-pay | Admitting: Cardiology

## 2017-07-06 DIAGNOSIS — I255 Ischemic cardiomyopathy: Secondary | ICD-10-CM

## 2017-07-06 DIAGNOSIS — M25511 Pain in right shoulder: Secondary | ICD-10-CM | POA: Diagnosis not present

## 2017-07-06 DIAGNOSIS — M5136 Other intervertebral disc degeneration, lumbar region: Secondary | ICD-10-CM | POA: Diagnosis not present

## 2017-07-06 DIAGNOSIS — M50922 Unspecified cervical disc disorder at C5-C6 level: Secondary | ICD-10-CM | POA: Diagnosis not present

## 2017-07-06 DIAGNOSIS — M50923 Unspecified cervical disc disorder at C6-C7 level: Secondary | ICD-10-CM | POA: Diagnosis not present

## 2017-07-06 DIAGNOSIS — Z713 Dietary counseling and surveillance: Secondary | ICD-10-CM | POA: Diagnosis not present

## 2017-07-07 ENCOUNTER — Other Ambulatory Visit: Payer: Self-pay | Admitting: Cardiology

## 2017-07-07 ENCOUNTER — Other Ambulatory Visit: Payer: Self-pay | Admitting: Family Medicine

## 2017-07-07 MED ORDER — BENZONATATE 100 MG PO CAPS: 100.0000 mg | ORAL_CAPSULE | Freq: Three times a day (TID) | ORAL | 0 refills | Status: DC | PRN

## 2017-07-07 NOTE — Telephone Encounter (Signed)
Should not need refills if filled for #90 and 3 refills in Feb.

## 2017-07-07 NOTE — Telephone Encounter (Signed)
REFILL 

## 2017-07-07 NOTE — Telephone Encounter (Signed)
Last OV 07/01/17 for Sinus, No Future OV  Last filled by Laurey Morale MD, on 04/23/17, # 90 with 3 refills.  Please advise if refuse or okay to fill.

## 2017-07-08 ENCOUNTER — Encounter: Payer: Self-pay | Admitting: Family Medicine

## 2017-07-08 ENCOUNTER — Ambulatory Visit (INDEPENDENT_AMBULATORY_CARE_PROVIDER_SITE_OTHER): Payer: BLUE CROSS/BLUE SHIELD | Admitting: Family Medicine

## 2017-07-08 VITALS — BP 110/62 | HR 66 | Temp 97.9°F | Ht 74.0 in | Wt 218.4 lb

## 2017-07-08 DIAGNOSIS — R0981 Nasal congestion: Secondary | ICD-10-CM

## 2017-07-08 DIAGNOSIS — F411 Generalized anxiety disorder: Secondary | ICD-10-CM | POA: Diagnosis not present

## 2017-07-08 NOTE — Progress Notes (Signed)
Subjective:     Patient ID: Peter Snyder, male   DOB: February 25, 1956, 62 y.o.   MRN: [id number]  HPI Patient seen recently for sinusitis. He was taking Augmentin and had some GI side effects and stopped this on his own. He felt worse last week and started back on Augmentin. He is looking at having some type of neuro ablation procedure done through orthopedics Friday and was questioning whether he should go through that. He has not had any fever. No purulent nasal discharge. Cough has resolved  Past Medical History:  Diagnosis Date  . Arthritis   . CAD (coronary artery disease) 6/14   Cardiac catheterization 6/27/ 2014 ejection fraction 35-40%, 30% proximal left circumflex, 100% tiny obtuse marginal 1 with collaterals, 50% LAD, 50% D1, 100% RCA with collaterals  . Chicken pox   . Colon polyps   . Depression   . Diabetes mellitus ([place])    Diet controlled  . GERD (gastroesophageal reflux disease)   . Hyperlipidemia   . Hypertension   . Kidney stone    Past Surgical History:  Procedure Laterality Date  . TONSILLECTOMY  1963    reports that he has quit smoking. He has never used smokeless tobacco. He reports that he does not drink alcohol or use drugs. family history includes Arthritis in his father and paternal grandmother; Clotting disorder in his paternal grandfather and paternal grandmother; Colon polyps in his father; Heart disease in his maternal grandmother; Hyperlipidemia in his maternal grandmother; Liver cancer in his paternal grandfather; Melanoma in his mother. Allergies  Allergen Reactions  . Sulfa Antibiotics Rash     Review of Systems  Constitutional: Negative for chills and fever.  HENT: Positive for congestion.   Respiratory: Negative for cough and shortness of breath.   Cardiovascular: Negative for chest pain.       Objective:   Physical Exam  Constitutional: He appears well-developed and well-nourished.  HENT:  Right Ear: External ear normal.  Left Ear: External  ear normal.  Mouth/Throat: Oropharynx is clear and moist.  Mild erythema nasal mucosa bilaterally. He has some thick white mucus in both nares. No polyps  Cardiovascular: Normal rate.  Pulmonary/Chest: Effort normal and breath sounds normal. No respiratory distress. He has no wheezes. He has no rales.       Assessment:     Persistent sinus congestion. Probable resolving acute sinusitis    Plan:     -No clear indication for further antibiotics at this time. Finish out current antibiotic and follow-up as needed -continue with saline nasal irrigation and nasal steroids.   [name] MD [facility] at [facility]

## 2017-07-10 DIAGNOSIS — M5136 Other intervertebral disc degeneration, lumbar region: Secondary | ICD-10-CM | POA: Diagnosis not present

## 2017-07-10 DIAGNOSIS — M47816 Spondylosis without myelopathy or radiculopathy, lumbar region: Secondary | ICD-10-CM | POA: Diagnosis not present

## 2017-07-14 ENCOUNTER — Other Ambulatory Visit: Payer: Self-pay | Admitting: Family Medicine

## 2017-07-14 DIAGNOSIS — M25511 Pain in right shoulder: Secondary | ICD-10-CM | POA: Diagnosis not present

## 2017-07-14 DIAGNOSIS — M5136 Other intervertebral disc degeneration, lumbar region: Secondary | ICD-10-CM | POA: Diagnosis not present

## 2017-07-14 DIAGNOSIS — M50923 Unspecified cervical disc disorder at C6-C7 level: Secondary | ICD-10-CM | POA: Diagnosis not present

## 2017-07-14 DIAGNOSIS — M50922 Unspecified cervical disc disorder at C5-C6 level: Secondary | ICD-10-CM | POA: Diagnosis not present

## 2017-07-16 DIAGNOSIS — M25511 Pain in right shoulder: Secondary | ICD-10-CM | POA: Diagnosis not present

## 2017-07-16 DIAGNOSIS — M50923 Unspecified cervical disc disorder at C6-C7 level: Secondary | ICD-10-CM | POA: Diagnosis not present

## 2017-07-16 DIAGNOSIS — M5136 Other intervertebral disc degeneration, lumbar region: Secondary | ICD-10-CM | POA: Diagnosis not present

## 2017-07-16 DIAGNOSIS — M50922 Unspecified cervical disc disorder at C5-C6 level: Secondary | ICD-10-CM | POA: Diagnosis not present

## 2017-07-17 DIAGNOSIS — F411 Generalized anxiety disorder: Secondary | ICD-10-CM | POA: Diagnosis not present

## 2017-07-18 ENCOUNTER — Other Ambulatory Visit: Payer: Self-pay | Admitting: Family Medicine

## 2017-07-19 DIAGNOSIS — M545 Low back pain: Secondary | ICD-10-CM | POA: Diagnosis not present

## 2017-07-20 DIAGNOSIS — M25562 Pain in left knee: Secondary | ICD-10-CM | POA: Diagnosis not present

## 2017-07-20 DIAGNOSIS — M1712 Unilateral primary osteoarthritis, left knee: Secondary | ICD-10-CM | POA: Diagnosis not present

## 2017-07-21 DIAGNOSIS — M5136 Other intervertebral disc degeneration, lumbar region: Secondary | ICD-10-CM | POA: Diagnosis not present

## 2017-07-21 DIAGNOSIS — L03032 Cellulitis of left toe: Secondary | ICD-10-CM | POA: Diagnosis not present

## 2017-07-21 DIAGNOSIS — M25572 Pain in left ankle and joints of left foot: Secondary | ICD-10-CM | POA: Diagnosis not present

## 2017-07-21 DIAGNOSIS — M50923 Unspecified cervical disc disorder at C6-C7 level: Secondary | ICD-10-CM | POA: Diagnosis not present

## 2017-07-21 DIAGNOSIS — M19072 Primary osteoarthritis, left ankle and foot: Secondary | ICD-10-CM | POA: Diagnosis not present

## 2017-07-21 DIAGNOSIS — M25511 Pain in right shoulder: Secondary | ICD-10-CM | POA: Diagnosis not present

## 2017-07-21 DIAGNOSIS — M50922 Unspecified cervical disc disorder at C5-C6 level: Secondary | ICD-10-CM | POA: Diagnosis not present

## 2017-07-22 ENCOUNTER — Ambulatory Visit: Payer: Self-pay

## 2017-07-22 NOTE — Telephone Encounter (Signed)
Pt calling after episode of lightheadedness after standing. Pt stated that it lasted 30 seconds to a minute. At time of call pt stated he felt fine and was not dizzy. Pt had toe surgery yesterday and is on Keflex and stated he is on HTN meds and feels that may be contributing to his sx. Pt states that he has been evaluated for this before. Care advice given per protocol. Pt wanted to be seen tomorrow am. Offered PCP appt for Friday but pt refused.  Appt made for tomorrow am. Reason for Disposition . [1] MILD dizziness (e.g., walking normally) AND [2] has been evaluated by physician for this  Answer Assessment - Initial Assessment Questions 1. DESCRIPTION: "Describe your dizziness."     Lightheaded and legs felt weak when he stood up suddenly- lasted 30 seconds to 1 minute 2. LIGHTHEADED: "Do you feel lightheaded?" (e.g., somewhat faint, woozy, weak upon standing)     Not at time of call but pt had sx when he stood up x 1 episode 3. VERTIGO: "Do you feel like either you or the room is spinning or tilting?" (i.e. vertigo)     no 4. SEVERITY: "How bad is it?"  "Do you feel like you are going to faint?" "Can you stand and walk?"   - MILD - walking normally   - MODERATE - interferes with normal activities (e.g., work, school)    - SEVERE - unable to stand, requires support to walk, feels like passing out now.      mild 5. ONSET:  "When did the dizziness begin?"     today 6. AGGRAVATING FACTORS: "Does anything make it worse?" (e.g., standing, change in head position)     standing 8. CAUSE: "What do you think is causing the dizziness?"     Keflex and had 2 steroid shots this week 9. RECURRENT SYMPTOM: "Have you had dizziness before?" If so, ask: "When was the last time?" "What happened that time?"     Yes-6 months ago- MD examined pt and did orthostatic BP readings 10. OTHER SYMPTOMS: "Do you have any other symptoms?" (e.g., fever, chest pain, vomiting, diarrhea, bleeding)       no 11.  PREGNANCY: "Is there any chance you are pregnant?" "When was your last menstrual period?"       n/a  Protocols used: DIZZINESS [facility]

## 2017-07-23 ENCOUNTER — Ambulatory Visit (INDEPENDENT_AMBULATORY_CARE_PROVIDER_SITE_OTHER): Payer: BLUE CROSS/BLUE SHIELD | Admitting: Adult Health

## 2017-07-23 ENCOUNTER — Encounter: Payer: Self-pay | Admitting: Adult Health

## 2017-07-23 VITALS — BP 116/60 | Temp 98.1°F | Wt 214.0 lb

## 2017-07-23 DIAGNOSIS — M5136 Other intervertebral disc degeneration, lumbar region: Secondary | ICD-10-CM | POA: Diagnosis not present

## 2017-07-23 DIAGNOSIS — M50922 Unspecified cervical disc disorder at C5-C6 level: Secondary | ICD-10-CM | POA: Diagnosis not present

## 2017-07-23 DIAGNOSIS — M25511 Pain in right shoulder: Secondary | ICD-10-CM | POA: Diagnosis not present

## 2017-07-23 DIAGNOSIS — R42 Dizziness and giddiness: Secondary | ICD-10-CM

## 2017-07-23 DIAGNOSIS — R5383 Other fatigue: Secondary | ICD-10-CM | POA: Diagnosis not present

## 2017-07-23 DIAGNOSIS — M50923 Unspecified cervical disc disorder at C6-C7 level: Secondary | ICD-10-CM | POA: Diagnosis not present

## 2017-07-23 MED ORDER — DOXYCYCLINE HYCLATE 100 MG PO CAPS: 100.0000 mg | ORAL_CAPSULE | Freq: Two times a day (BID) | ORAL | 0 refills | Status: DC

## 2017-07-23 NOTE — Progress Notes (Signed)
Subjective:    Patient ID: Peter Snyder, male    DOB: 14-Mar-1956, 62 y.o.   MRN: [id number]  HPI  62 year old male who  has a past medical history of Arthritis, CAD (coronary artery disease) (6/14), Chicken pox, Colon polyps, Depression, Diabetes mellitus ([place]), GERD (gastroesophageal reflux disease), Hyperlipidemia, Hypertension, and Kidney stone. Presents for an acute issue of dizziness and fatigue.  She reports that he is recently placed on a course of Keflex by podiatry after he had an ingrown toenail cut out.  Reports took approximately 3 days of this and during that timeframe he had a single episode of dizziness without syncopal episode and overall feeling fatigued.  Yesterday he stopped taking Keflex as he read that it could cause fatigue and dizziness, today he feels "back to normal".  Is unable to contact his podiatrist for a different antibiotic; supposedly only in the office on Tuesday.    Review of Systems See HPI   Past Medical History:  Diagnosis Date  . Arthritis   . CAD (coronary artery disease) 6/14   Cardiac catheterization 6/27/ 2014 ejection fraction 35-40%, 30% proximal left circumflex, 100% tiny obtuse marginal 1 with collaterals, 50% LAD, 50% D1, 100% RCA with collaterals  . Chicken pox   . Colon polyps   . Depression   . Diabetes mellitus ([place])    Diet controlled  . GERD (gastroesophageal reflux disease)   . Hyperlipidemia   . Hypertension   . Kidney stone     Social History   Socioeconomic History  . Marital status: Married    Spouse name: Not on file  . Number of children: 3  . Years of education: Not on file  . Highest education level: Not on file  Occupational History  . Occupation: retired    Comment: Retired  Scientific laboratory technician  . Financial resource strain: Not on file  . Food insecurity:    Worry: Not on file    Inability: Not on file  . Transportation needs:    Medical: Not on file    Non-medical: Not on file  Tobacco Use  . Smoking  status: Former Research scientist (life sciences)  . Smokeless tobacco: Never Used  . Tobacco comment: quit 15 years ago -socially  Substance and Sexual Activity  . Alcohol use: No    Alcohol/week: 0.0 oz  . Drug use: No  . Sexual activity: Not on file  Lifestyle  . Physical activity:    Days per week: Not on file    Minutes per session: Not on file  . Stress: Not on file  Relationships  . Social connections:    Talks on phone: Not on file    Gets together: Not on file    Attends religious service: Not on file    Active member of club or organization: Not on file    Attends meetings of clubs or organizations: Not on file    Relationship status: Not on file  . Intimate partner violence:    Fear of current or ex partner: Not on file    Emotionally abused: Not on file    Physically abused: Not on file    Forced sexual activity: Not on file  Other Topics Concern  . Not on file  Social History Narrative  . Not on file    Past Surgical History:  Procedure Laterality Date  . TONSILLECTOMY  1963    Family History  Problem Relation Age of Onset  . Melanoma Mother  metastatic  . Arthritis Father   . Colon polyps Father   . Hyperlipidemia Maternal Grandmother   . Heart disease Maternal Grandmother   . Arthritis Paternal Grandmother   . Clotting disorder Paternal Grandmother   . Clotting disorder Paternal Grandfather   . Liver cancer Paternal Grandfather   . Colon cancer Neg Hx   . Esophageal cancer Neg Hx   . Rectal cancer Neg Hx     Allergies  Allergen Reactions  . Sulfa Antibiotics Rash    Current Outpatient Medications on File Prior to Visit  Medication Sig Dispense Refill  . acetaminophen (TYLENOL) 500 MG tablet Take 500 mg by mouth every 6 (six) hours as needed for moderate pain.    Marland Kitchen ALPRAZolam (XANAX) 0.25 MG tablet Take 0.25 mg by mouth daily.     Marland Kitchen aspirin 81 MG tablet Take 81 mg by mouth daily.    Marland Kitchen atorvastatin (LIPITOR) 80 MG tablet TAKE 1 TABLET BY MOUTH EVERY DAY 90  tablet 1  . benzonatate (TESSALON) 100 MG capsule Take 1 capsule (100 mg total) by mouth every 8 (eight) hours as needed for cough. 30 capsule 0  . Bismuth Tribromoph-Petrolatum (XEROFORM PETROLAT PATCH 4"X4") PADS USE AS DIRECTED TWICE A DAY EXTERNALLY 30 DAYS  4  . carvedilol (COREG) 12.5 MG tablet TAKE 1 TABLET (12.5 MG TOTAL) BY MOUTH 2 (TWO) TIMES DAILY WITH A MEAL. 180 tablet 2  . celecoxib (CELEBREX) 200 MG capsule Take 200 mg by mouth daily.     . clonazePAM (KLONOPIN) 0.5 MG tablet Take 0.5 mg by mouth at bedtime.     . Dermatological Products, Misc. (LEVICYN) GEL Apply 1 application topically 2 (two) times daily after a meal.  2  . Dermatological Products, Misc. (TETRIX) CREA Apply topically See admin instructions.  3  . ELIDEL 1 % cream APPLY TO AFFECTED AREA TWICE A DAY AS NEEDED FOR 14 DAYS  2  . escitalopram (LEXAPRO) 20 MG tablet Take 20 mg by mouth daily.    Marland Kitchen ezetimibe (ZETIA) 10 MG tablet TAKE 1 TABLET (10 MG TOTAL) BY MOUTH DAILY. 90 tablet 1  . fluticasone (FLONASE) 50 MCG/ACT nasal spray INHALE 2 SPRAYS IN EACH NOSTRIL EVERY DAY 48 g 2  . gabapentin (NEURONTIN) 600 MG tablet TAKE 1 TABLET BY MOUTH TWICE A DAY 180 tablet 1  . lisinopril (PRINIVIL,ZESTRIL) 20 MG tablet Take 20 mg by mouth daily.    Marland Kitchen lisinopril (PRINIVIL,ZESTRIL) 20 MG tablet TAKE 1 TABLET BY MOUTH EVERY DAY 90 tablet 1  . LOTEMAX 0.5 % GEL     . montelukast (SINGULAIR) 10 MG tablet Take 1 tablet (10 mg total) by mouth at bedtime. 30 tablet 3  . pantoprazole (PROTONIX) 40 MG tablet TAKE 1 TABLET (40 MG TOTAL) BY MOUTH DAILY. 90 tablet 3  . RESTASIS 0.05 % ophthalmic emulsion Place 1 drop into both eyes 2 (two) times daily.     . tacrolimus (PROTOPIC) 0.1 % ointment     . tamsulosin (FLOMAX) 0.4 MG CAPS capsule TAKE 1 CAPSULE (0.4 MG TOTAL) BY MOUTH DAILY AFTER SUPPER. 90 capsule 1  . traMADol (ULTRAM) 50 MG tablet Take 1 tablet (50 mg total) by mouth every 6 (six) hours as needed. 40 tablet 0  . triamcinolone  cream (KENALOG) 0.1 % Apply 1 application topically 2 (two) times daily. 30 g 0  . urea (CARMOL) 10 % cream APPLY TO AFFECTED AREA TWICE A DAY AS NEEDED  3  . Urea 40 % LOTN  Apply 1 application topically 2 (two) times a week.  3  . VOLTAREN 1 % GEL Apply 2 g topically daily.     Marland Kitchen zolpidem (AMBIEN) 10 MG tablet Take 10 mg by mouth at bedtime as needed for sleep.     No current facility-administered medications on file prior to visit.     BP 116/60   Temp 98.1 F (36.7 C) (Oral)   Wt 214 lb (97.1 kg)   BMI 27.48 kg/m       Objective:   Physical Exam  Constitutional: He is oriented to person, place, and time. He appears well-developed and well-nourished. No distress.  Cardiovascular: Normal rate, regular rhythm, normal heart sounds and intact distal pulses. Exam reveals no gallop and no friction rub.  No murmur heard. Pulmonary/Chest: Effort normal and breath sounds normal. No stridor. No respiratory distress. He has no wheezes. He has no rales. He exhibits no tenderness.  Neurological: He is alert and oriented to person, place, and time.  Skin: Skin is warm and dry. Capillary refill takes less than 2 seconds. No rash noted. He is not diaphoretic. No erythema. No pallor.  Psychiatric: He has a normal mood and affect. His behavior is normal. Judgment and thought content normal.  Nursing note and vitals reviewed.     Assessment & Plan:  Not overtly concerned that Keflex was the causes of dizziness especially since he was a single episode.  May be the cause of fatigue.  Patient would feel better going on a different antibiotic at this point in time.  We will send in doxycycline 100 mg twice daily x7 days.  Advised to follow-up as needed  Dorothyann Peng, NP

## 2017-07-24 ENCOUNTER — Ambulatory Visit: Payer: BLUE CROSS/BLUE SHIELD | Admitting: Family Medicine

## 2017-07-24 DIAGNOSIS — M47816 Spondylosis without myelopathy or radiculopathy, lumbar region: Secondary | ICD-10-CM | POA: Diagnosis not present

## 2017-07-24 DIAGNOSIS — M5030 Other cervical disc degeneration, unspecified cervical region: Secondary | ICD-10-CM | POA: Diagnosis not present

## 2017-07-24 DIAGNOSIS — M5136 Other intervertebral disc degeneration, lumbar region: Secondary | ICD-10-CM | POA: Diagnosis not present

## 2017-07-27 DIAGNOSIS — M25511 Pain in right shoulder: Secondary | ICD-10-CM | POA: Diagnosis not present

## 2017-07-27 DIAGNOSIS — M50922 Unspecified cervical disc disorder at C5-C6 level: Secondary | ICD-10-CM | POA: Diagnosis not present

## 2017-07-27 DIAGNOSIS — M50923 Unspecified cervical disc disorder at C6-C7 level: Secondary | ICD-10-CM | POA: Diagnosis not present

## 2017-07-27 DIAGNOSIS — M5136 Other intervertebral disc degeneration, lumbar region: Secondary | ICD-10-CM | POA: Diagnosis not present

## 2017-07-28 ENCOUNTER — Encounter: Payer: Self-pay | Admitting: Family Medicine

## 2017-07-28 ENCOUNTER — Ambulatory Visit (INDEPENDENT_AMBULATORY_CARE_PROVIDER_SITE_OTHER): Payer: BLUE CROSS/BLUE SHIELD | Admitting: Family Medicine

## 2017-07-28 VITALS — BP 120/70 | HR 67 | Temp 97.9°F | Wt 213.5 lb

## 2017-07-28 DIAGNOSIS — M545 Low back pain, unspecified: Secondary | ICD-10-CM

## 2017-07-28 DIAGNOSIS — R300 Dysuria: Secondary | ICD-10-CM | POA: Diagnosis not present

## 2017-07-28 DIAGNOSIS — L97521 Non-pressure chronic ulcer of other part of left foot limited to breakdown of skin: Secondary | ICD-10-CM | POA: Diagnosis not present

## 2017-07-28 DIAGNOSIS — M25572 Pain in left ankle and joints of left foot: Secondary | ICD-10-CM | POA: Diagnosis not present

## 2017-07-28 LAB — POCT URINALYSIS DIPSTICK
Bilirubin, UA: NEGATIVE
GLUCOSE UA: NEGATIVE
KETONES UA: NEGATIVE
LEUKOCYTES UA: NEGATIVE
NITRITE UA: NEGATIVE
Protein, UA: NEGATIVE
RBC UA: NEGATIVE
SPEC GRAV UA: 1.01 (ref 1.010–1.025)
Urobilinogen, UA: 0.2 E.U./dL
pH, UA: 6 (ref 5.0–8.0)

## 2017-07-28 NOTE — Patient Instructions (Signed)

## 2017-07-28 NOTE — Progress Notes (Signed)
Subjective:     Patient ID: Peter Snyder, male   DOB: 11-03-1955, 62 y.o.   MRN: [id number]  HPI Patient seen with low back pain and also some mild intermittent burning with urination for the past few days. He denies any fevers or chills. No penile discharge. Patient relates he's had some ongoing back difficulties for several years. He had recent nerve ablation procedure through [facility] but then 2 weeks ago today he was eating at [employer] and states a 62 year old lady slumped forward. He ended up helping her get to the floor and was in an awkward position with his back flexed and  worsening pain since then. He has pain which is bilateral lumbar area and radiates toward the buttock bilaterally. He is doing better with ambulation and worse with sitting.  He's taken some Tylenol and also Celebrex with some improvement. No fevers or chills. No radiculitis symptoms. No numbness or weakness. Currently getting physical therapy once per week through [facility] physical therapy  Past Medical History:  Diagnosis Date  . Arthritis   . CAD (coronary artery disease) 6/14   Cardiac catheterization 6/27/ 2014 ejection fraction 35-40%, 30% proximal left circumflex, 100% tiny obtuse marginal 1 with collaterals, 50% LAD, 50% D1, 100% RCA with collaterals  . Chicken pox   . Colon polyps   . Depression   . Diabetes mellitus ([place])    Diet controlled  . GERD (gastroesophageal reflux disease)   . Hyperlipidemia   . Hypertension   . Kidney stone    Past Surgical History:  Procedure Laterality Date  . TONSILLECTOMY  1963    reports that he has quit smoking. He has never used smokeless tobacco. He reports that he does not drink alcohol or use drugs. family history includes Arthritis in his father and paternal grandmother; Clotting disorder in his paternal grandfather and paternal grandmother; Colon polyps in his father; Heart disease in his maternal grandmother; Hyperlipidemia in his maternal  grandmother; Liver cancer in his paternal grandfather; Melanoma in his mother. Allergies  Allergen Reactions  . Sulfa Antibiotics Rash     Review of Systems  Constitutional: Negative for activity change, appetite change and fever.  Respiratory: Negative for cough and shortness of breath.   Cardiovascular: Negative for chest pain and leg swelling.  Gastrointestinal: Negative for abdominal pain and vomiting.  Genitourinary: Negative for dysuria, flank pain and hematuria.  Musculoskeletal: Positive for back pain. Negative for joint swelling.  Neurological: Negative for weakness and numbness.       Objective:   Physical Exam  Constitutional: He is oriented to person, place, and time. He appears well-developed and well-nourished. No distress.  Neck: No thyromegaly present.  Cardiovascular: Normal rate, regular rhythm and normal heart sounds.  No murmur heard. Pulmonary/Chest: Effort normal and breath sounds normal. No respiratory distress. He has no wheezes. He has no rales.  Musculoskeletal: He exhibits no edema.  Neurological: He is alert and oriented to person, place, and time. He has normal reflexes. No cranial nerve deficit.  Skin: No rash noted.       Assessment:     #1 mild dysuria. Urine dipstick today is completely clear. No specific risk factors for STD and has no penile discharge or other concerning symptoms  #2 bilateral lumbar back pain. Nonfocal exam neurologically. History of reported spondylosis of the lower back and probably aggravated by recent events above    Plan:     -Continue with physical therapy -Continue with walking as tolerated -Continue conservative  therapy for his back. He is taking Celebrex and has some tramadol supplement to use as needed. -Follow-up immediately for any weakness, numbness, or other concerns  [name] MD [facility] at [facility]

## 2017-07-29 DIAGNOSIS — M5136 Other intervertebral disc degeneration, lumbar region: Secondary | ICD-10-CM | POA: Diagnosis not present

## 2017-07-29 DIAGNOSIS — M25511 Pain in right shoulder: Secondary | ICD-10-CM | POA: Diagnosis not present

## 2017-07-29 DIAGNOSIS — M50922 Unspecified cervical disc disorder at C5-C6 level: Secondary | ICD-10-CM | POA: Diagnosis not present

## 2017-07-29 DIAGNOSIS — M50923 Unspecified cervical disc disorder at C6-C7 level: Secondary | ICD-10-CM | POA: Diagnosis not present

## 2017-07-31 DIAGNOSIS — M47816 Spondylosis without myelopathy or radiculopathy, lumbar region: Secondary | ICD-10-CM | POA: Diagnosis not present

## 2017-07-31 DIAGNOSIS — M5030 Other cervical disc degeneration, unspecified cervical region: Secondary | ICD-10-CM | POA: Diagnosis not present

## 2017-07-31 DIAGNOSIS — M5136 Other intervertebral disc degeneration, lumbar region: Secondary | ICD-10-CM | POA: Diagnosis not present

## 2017-08-03 DIAGNOSIS — M50922 Unspecified cervical disc disorder at C5-C6 level: Secondary | ICD-10-CM | POA: Diagnosis not present

## 2017-08-03 DIAGNOSIS — M25511 Pain in right shoulder: Secondary | ICD-10-CM | POA: Diagnosis not present

## 2017-08-03 DIAGNOSIS — M50923 Unspecified cervical disc disorder at C6-C7 level: Secondary | ICD-10-CM | POA: Diagnosis not present

## 2017-08-03 DIAGNOSIS — Z713 Dietary counseling and surveillance: Secondary | ICD-10-CM | POA: Diagnosis not present

## 2017-08-03 DIAGNOSIS — M5136 Other intervertebral disc degeneration, lumbar region: Secondary | ICD-10-CM | POA: Diagnosis not present

## 2017-08-05 DIAGNOSIS — M47816 Spondylosis without myelopathy or radiculopathy, lumbar region: Secondary | ICD-10-CM | POA: Diagnosis not present

## 2017-08-05 DIAGNOSIS — M5030 Other cervical disc degeneration, unspecified cervical region: Secondary | ICD-10-CM | POA: Diagnosis not present

## 2017-08-05 DIAGNOSIS — M5136 Other intervertebral disc degeneration, lumbar region: Secondary | ICD-10-CM | POA: Diagnosis not present

## 2017-08-06 DIAGNOSIS — F411 Generalized anxiety disorder: Secondary | ICD-10-CM | POA: Diagnosis not present

## 2017-08-12 ENCOUNTER — Ambulatory Visit (INDEPENDENT_AMBULATORY_CARE_PROVIDER_SITE_OTHER): Payer: BLUE CROSS/BLUE SHIELD | Admitting: Family Medicine

## 2017-08-12 ENCOUNTER — Encounter: Payer: Self-pay | Admitting: Family Medicine

## 2017-08-12 VITALS — Temp 98.1°F | Wt 211.7 lb

## 2017-08-12 DIAGNOSIS — M50922 Unspecified cervical disc disorder at C5-C6 level: Secondary | ICD-10-CM | POA: Diagnosis not present

## 2017-08-12 DIAGNOSIS — I10 Essential (primary) hypertension: Secondary | ICD-10-CM

## 2017-08-12 DIAGNOSIS — R42 Dizziness and giddiness: Secondary | ICD-10-CM

## 2017-08-12 DIAGNOSIS — M25511 Pain in right shoulder: Secondary | ICD-10-CM | POA: Diagnosis not present

## 2017-08-12 DIAGNOSIS — M50923 Unspecified cervical disc disorder at C6-C7 level: Secondary | ICD-10-CM | POA: Diagnosis not present

## 2017-08-12 DIAGNOSIS — M5136 Other intervertebral disc degeneration, lumbar region: Secondary | ICD-10-CM | POA: Diagnosis not present

## 2017-08-12 NOTE — Progress Notes (Signed)
  Subjective:     Patient ID: Peter Snyder, male   DOB: 07-20-1955, 62 y.o.   MRN: [id number]  HPI Patient seen with a few recent episodes of lightheadedness when standing. No recent syncope. No chest pains. No dyspnea. He has lost about 7 or 8 pounds over the past month or so due to his efforts. He had some back difficulties which had limited his walking and now picking that back up. Also eating very well.  He has cardiac history and is maintained on lisinopril and carvedilol. Also takes tamsulosin for prostate issues. He feels he is staying well-hydrated. No palpitations. Symptoms are usually very transient.  Past Medical History:  Diagnosis Date  . Arthritis   . CAD (coronary artery disease) 6/14   Cardiac catheterization 6/27/ 2014 ejection fraction 35-40%, 30% proximal left circumflex, 100% tiny obtuse marginal 1 with collaterals, 50% LAD, 50% D1, 100% RCA with collaterals  . Chicken pox   . Colon polyps   . Depression   . Diabetes mellitus ([place])    Diet controlled  . GERD (gastroesophageal reflux disease)   . Hyperlipidemia   . Hypertension   . Kidney stone    Past Surgical History:  Procedure Laterality Date  . TONSILLECTOMY  1963    reports that he has quit smoking. He has never used smokeless tobacco. He reports that he does not drink alcohol or use drugs. family history includes Arthritis in his father and paternal grandmother; Clotting disorder in his paternal grandfather and paternal grandmother; Colon polyps in his father; Heart disease in his maternal grandmother; Hyperlipidemia in his maternal grandmother; Liver cancer in his paternal grandfather; Melanoma in his mother. Allergies  Allergen Reactions  . Sulfa Antibiotics Rash     Review of Systems  Constitutional: Negative for appetite change and fever.  Respiratory: Negative for shortness of breath.   Cardiovascular: Negative for chest pain.  Genitourinary: Negative for dysuria.  Neurological: Positive for  dizziness and light-headedness. Negative for syncope and weakness.       Objective:   Physical Exam  Constitutional: He appears well-developed and well-nourished.  Cardiovascular: Normal rate and regular rhythm.  Pulse=56 by palpation.  Pulmonary/Chest: Effort normal and breath sounds normal. He has no wheezes. He has no rales.  Musculoskeletal: He exhibits no edema.  Neurological: He is alert. No cranial nerve deficit.       Assessment:     Episodic lightheadedness with standing. No orthostatic change on exam today. Repeat blood pressure by me seated 105/60 and standing 105/60 (no change).    Plan:     -Continue current medication regimen -Reminder to change positions slowly -Stay well-hydrated and continue good electrolyte replacement with sodium and potassium with his increased walking. Also avoid walking outside middle of the day  [name] MD [facility] at [facility]

## 2017-08-12 NOTE — Patient Instructions (Signed)
Stay well hydrated  Change positions (eg sitting to standing) slowly  Continue with good electrolyte replacement with sodium and potassium.

## 2017-08-17 DIAGNOSIS — M50923 Unspecified cervical disc disorder at C6-C7 level: Secondary | ICD-10-CM | POA: Diagnosis not present

## 2017-08-17 DIAGNOSIS — M5136 Other intervertebral disc degeneration, lumbar region: Secondary | ICD-10-CM | POA: Diagnosis not present

## 2017-08-17 DIAGNOSIS — M50922 Unspecified cervical disc disorder at C5-C6 level: Secondary | ICD-10-CM | POA: Diagnosis not present

## 2017-08-17 DIAGNOSIS — M25511 Pain in right shoulder: Secondary | ICD-10-CM | POA: Diagnosis not present

## 2017-08-18 DIAGNOSIS — D692 Other nonthrombocytopenic purpura: Secondary | ICD-10-CM | POA: Diagnosis not present

## 2017-08-18 DIAGNOSIS — L723 Sebaceous cyst: Secondary | ICD-10-CM | POA: Diagnosis not present

## 2017-08-18 DIAGNOSIS — L570 Actinic keratosis: Secondary | ICD-10-CM | POA: Diagnosis not present

## 2017-08-18 DIAGNOSIS — L309 Dermatitis, unspecified: Secondary | ICD-10-CM | POA: Diagnosis not present

## 2017-08-24 DIAGNOSIS — M50922 Unspecified cervical disc disorder at C5-C6 level: Secondary | ICD-10-CM | POA: Diagnosis not present

## 2017-08-24 DIAGNOSIS — M5136 Other intervertebral disc degeneration, lumbar region: Secondary | ICD-10-CM | POA: Diagnosis not present

## 2017-08-24 DIAGNOSIS — M50923 Unspecified cervical disc disorder at C6-C7 level: Secondary | ICD-10-CM | POA: Diagnosis not present

## 2017-08-24 DIAGNOSIS — M25511 Pain in right shoulder: Secondary | ICD-10-CM | POA: Diagnosis not present

## 2017-08-27 DIAGNOSIS — M25511 Pain in right shoulder: Secondary | ICD-10-CM | POA: Diagnosis not present

## 2017-08-27 DIAGNOSIS — M50923 Unspecified cervical disc disorder at C6-C7 level: Secondary | ICD-10-CM | POA: Diagnosis not present

## 2017-08-27 DIAGNOSIS — M5136 Other intervertebral disc degeneration, lumbar region: Secondary | ICD-10-CM | POA: Diagnosis not present

## 2017-08-27 DIAGNOSIS — M50922 Unspecified cervical disc disorder at C5-C6 level: Secondary | ICD-10-CM | POA: Diagnosis not present

## 2017-08-28 DIAGNOSIS — F411 Generalized anxiety disorder: Secondary | ICD-10-CM | POA: Diagnosis not present

## 2017-08-31 DIAGNOSIS — M5136 Other intervertebral disc degeneration, lumbar region: Secondary | ICD-10-CM | POA: Diagnosis not present

## 2017-08-31 DIAGNOSIS — M25511 Pain in right shoulder: Secondary | ICD-10-CM | POA: Diagnosis not present

## 2017-08-31 DIAGNOSIS — M50922 Unspecified cervical disc disorder at C5-C6 level: Secondary | ICD-10-CM | POA: Diagnosis not present

## 2017-08-31 DIAGNOSIS — M50923 Unspecified cervical disc disorder at C6-C7 level: Secondary | ICD-10-CM | POA: Diagnosis not present

## 2017-09-02 ENCOUNTER — Encounter: Payer: Self-pay | Admitting: Family Medicine

## 2017-09-02 ENCOUNTER — Ambulatory Visit (INDEPENDENT_AMBULATORY_CARE_PROVIDER_SITE_OTHER): Payer: BLUE CROSS/BLUE SHIELD | Admitting: Family Medicine

## 2017-09-02 ENCOUNTER — Encounter: Payer: Self-pay | Admitting: Neurology

## 2017-09-02 VITALS — BP 120/72 | HR 67 | Temp 97.5°F | Wt 210.7 lb

## 2017-09-02 DIAGNOSIS — R251 Tremor, unspecified: Secondary | ICD-10-CM

## 2017-09-02 NOTE — Progress Notes (Signed)
Subjective:     Patient ID: Peter Snyder, male   DOB: [birth year], 62 y.o.   MRN: [id number]  HPI Patient is here to discuss tremor predominantly right upper extremity  He states about 10 years ago he saw a neurologist in [place] and was diagnosed with "familial tremor ". He does recall maternal grandfather with tremor involving one of his hands. Patient has very minimal involvement left and almost exclusively right. He has recently noted difficulties with writing which been progressive. He states for several years now he has trained himself to eat with his left hand because of issues. He states this is more of an embarrassment than anything. He has not noted any head or neck tremor. No alcohol use. Moderate caffeine use. Normal TSH 12/18. He has noticed that things like anxiety (eg, when he is observed) his tremor seems to be worse  Patient already takes gabapentin 600 mg twice a day. We tried to increase to 3 times a day once previously but he had increased fatigue issues and could not tolerate. He takes carvedilol per cardiology  Past Medical History:  Diagnosis Date  . Arthritis   . CAD (coronary artery disease) 6/14   Cardiac catheterization 6/27/ 2014 ejection fraction 35-40%, 30% proximal left circumflex, 100% tiny obtuse marginal 1 with collaterals, 50% LAD, 50% D1, 100% RCA with collaterals  . Chicken pox   . Colon polyps   . Depression   . Diabetes mellitus ([place])    Diet controlled  . GERD (gastroesophageal reflux disease)   . Hyperlipidemia   . Hypertension   . Kidney stone    Past Surgical History:  Procedure Laterality Date  . TONSILLECTOMY  1963    reports that he has quit smoking. He has never used smokeless tobacco. He reports that he does not drink alcohol or use drugs. family history includes Arthritis in his father and paternal grandmother; Clotting disorder in his paternal grandfather and paternal grandmother; Colon polyps in his father; Heart disease in his maternal  grandmother; Hyperlipidemia in his maternal grandmother; Liver cancer in his paternal grandfather; Melanoma in his mother. Allergies  Allergen Reactions  . Sulfa Antibiotics Rash     Review of Systems  Constitutional: Negative for fatigue.  Eyes: Negative for visual disturbance.  Respiratory: Negative for cough, chest tightness and shortness of breath.   Cardiovascular: Negative for chest pain, palpitations and leg swelling.  Neurological: Positive for tremors. Negative for dizziness, syncope, speech difficulty, weakness, light-headedness and headaches.       Objective:   Physical Exam  Constitutional: He is oriented to person, place, and time. He appears well-developed and well-nourished.  HENT:  Right Ear: External ear normal.  Left Ear: External ear normal.  Mouth/Throat: Oropharynx is clear and moist.  Eyes: Pupils are equal, round, and reactive to light.  Neck: Neck supple. No thyromegaly present.  Cardiovascular: Normal rate and regular rhythm.  Pulmonary/Chest: Effort normal and breath sounds normal. No respiratory distress. He has no wheezes. He has no rales.  Musculoskeletal: He exhibits no edema.  Neurological: He is alert and oriented to person, place, and time. No cranial nerve deficit. Coordination normal.  Patient has tremor right upper extremity with intention. None noted on the left. No cogwheel rigidity. Gait normal.       Assessment:     Tremor involving right upper extremity-suspect hereditary tremor. Patient already on gabapentin and takes carvedilol.    Plan:     -Discussed options. We gave option of increasing gabapentin to  3 times a day but he's had previous intolerance with fatigue. We also discussed other potential treatment options such as Mysoline -We discussed option of getting another neurology opinion regarding diagnosis and management and he would like to go that route. Will set up with Dr.Tat -Discussed importance of avoiding excessive caffeine  and other things that could exacerbate  [name] MD [facility] at [facility]

## 2017-09-02 NOTE — Patient Instructions (Signed)
Tremor A tremor is trembling or shaking that you cannot control. Most tremors affect the hands or arms. Tremors can also affect the head, vocal cords, face, and other parts of the body. There are many types of tremors. Common types include:  Essential tremor. These usually occur in people over the age of 42. It may run in families and can happen in otherwise healthy people.  Resting tremor. These occur when the muscles are at rest, such as when your hands are resting in your lap. People with Parkinson disease often have resting tremors.  Postural tremor. These occur when you try to hold a pose, such as keeping your hands outstretched.  Kinetic tremor. These occur during purposeful movement, such as trying to touch a finger to your nose.  Task-specific tremor. These may occur when you perform tasks such as handwriting, speaking, or standing.  Psychogenic tremor. These dramatically lessen or disappear when you are distracted. They can happen in people of all ages.  Some types of tremors have no known cause. Tremors can also be a symptom of nervous system problems (neurological disorders) that may occur with aging. Some tremors go away with treatment while others do not. Follow these instructions at home: Watch your tremor for any changes. The following actions may help to lessen any discomfort you are feeling:  Take medicines only as directed by your health care provider.  Limit alcohol intake to no more than 1 drink per day for nonpregnant women and 2 drinks per day for men. One drink equals 12 oz of beer, 5 oz of wine, or 1 oz of hard liquor.  Do not use any tobacco products, including cigarettes, chewing tobacco, or electronic cigarettes. If you need help quitting, ask your health care provider.  Avoid extreme heat or cold.  Limit the amount of caffeine you consumeas directed by your health care provider.  Try to get 8 hours of sleep each night.  Find ways to manage your stress,  such as meditation or yoga.  Keep all follow-up visits as directed by your health care provider. This is important.  Contact a health care provider if:  You start having a tremor after starting a new medicine.  You have tremor with other symptoms such as: ? Numbness. ? Tingling. ? Pain. ? Weakness.  Your tremor gets worse.  Your tremor interferes with your day-to-day life. This information is not intended to replace advice given to you by your health care provider. Make sure you discuss any questions you have with your health care provider. Document Released: 02/21/2002 Document Revised: 11/04/2015 Document Reviewed: 08/29/2013 Elsevier Interactive Patient Education  [employer].  We will set up follow up with Dr Tat regarding your tremor.

## 2017-09-07 ENCOUNTER — Encounter: Payer: Self-pay | Admitting: Family Medicine

## 2017-09-07 ENCOUNTER — Ambulatory Visit (INDEPENDENT_AMBULATORY_CARE_PROVIDER_SITE_OTHER): Payer: BLUE CROSS/BLUE SHIELD | Admitting: Family Medicine

## 2017-09-07 VITALS — BP 120/70 | HR 61 | Temp 98.0°F | Wt 216.4 lb

## 2017-09-07 DIAGNOSIS — R635 Abnormal weight gain: Secondary | ICD-10-CM

## 2017-09-07 DIAGNOSIS — R0981 Nasal congestion: Secondary | ICD-10-CM

## 2017-09-07 DIAGNOSIS — L299 Pruritus, unspecified: Secondary | ICD-10-CM

## 2017-09-07 NOTE — Progress Notes (Signed)
  Subjective:     Patient ID: Peter Snyder, male   DOB: December 11, 1955, 62 y.o.   MRN: [id number]  HPI Patient seen with 3 separate items as follows  Weight gain per his home scales of 3-1/2 pounds over the past day. He weighs himself each morning. He does have history of some systolic dysfunction but has had no peripheral edema whatsoever and no orthopnea. He walked 4 miles yesterday without difficulty. Walked earlier today without difficulty as well.  Sinus congestion of one day duration. He has nasal stuffiness. He uses nasal steroid at baseline. No purulent secretions. No fevers or chills. No cough.  Pruritus involving right elbow region. He's not seen any visible rash. No other areas of pruritus.  Past Medical History:  Diagnosis Date  . Arthritis   . CAD (coronary artery disease) 6/14   Cardiac catheterization 6/27/ 2014 ejection fraction 35-40%, 30% proximal left circumflex, 100% tiny obtuse marginal 1 with collaterals, 50% LAD, 50% D1, 100% RCA with collaterals  . Chicken pox   . Colon polyps   . Depression   . Diabetes mellitus ([place])    Diet controlled  . GERD (gastroesophageal reflux disease)   . Hyperlipidemia   . Hypertension   . Kidney stone    Past Surgical History:  Procedure Laterality Date  . TONSILLECTOMY  1963    reports that he has quit smoking. He has never used smokeless tobacco. He reports that he does not drink alcohol or use drugs. family history includes Arthritis in his father and paternal grandmother; Clotting disorder in his paternal grandfather and paternal grandmother; Colon polyps in his father; Heart disease in his maternal grandmother; Hyperlipidemia in his maternal grandmother; Liver cancer in his paternal grandfather; Melanoma in his mother. Allergies  Allergen Reactions  . Sulfa Antibiotics Rash     Review of Systems  Constitutional: Positive for unexpected weight change. Negative for appetite change and fever.  HENT: Positive for congestion.  Negative for nosebleeds, sinus pressure and sinus pain.   Respiratory: Negative for cough and shortness of breath.   Cardiovascular: Negative for chest pain and leg swelling.  Skin: Negative for rash.  Neurological: Negative for headaches.  Hematological: Negative for adenopathy.       Objective:   Physical Exam  Constitutional: He appears well-developed and well-nourished.  HENT:  Right Ear: External ear normal.  Left Ear: External ear normal.  Mouth/Throat: Oropharynx is clear and moist.  Cardiovascular: Normal rate and regular rhythm.  Pulmonary/Chest: Effort normal and breath sounds normal. He has no wheezes. He has no rales.  Skin: No rash noted.  He has couple of excoriated areas right elbow posterior but no actual skin rash.       Assessment:     #1 reported weight gain of 3.5 pounds by home scales. He does have history of systolic dysfunction but no evidence for heart failure at this time. He has no peripheral edema whatsoever no orthopnea and un-remarkable exam.  #2 pruritus involving right elbow in absence of rash-?neuropathic pruritis  #3 nasal congestion. History of frequent issues with allergic rhinitis     Plan:     -We recommend continued close monitoring of weight. Follow-up immediately for any peripheral edema, dyspnea, orthopnea. -Try saline nasal spray once or twice daily for nasal congestive symptoms. Limit Afrin but no more than 2-3 days -Observation regarding right elbow. Follow-up for any rash or other changes  [name] MD [facility] at [facility]

## 2017-09-07 NOTE — Patient Instructions (Signed)
Continue to monitor weight gain and follow up for any shortness of breath or increased leg/ankle edema.  Continue with saline nasal spray.  If you use Afrin nasal limit to 2-3 days.

## 2017-09-08 ENCOUNTER — Encounter: Payer: Self-pay | Admitting: Family Medicine

## 2017-09-08 DIAGNOSIS — M25511 Pain in right shoulder: Secondary | ICD-10-CM | POA: Diagnosis not present

## 2017-09-08 DIAGNOSIS — M50922 Unspecified cervical disc disorder at C5-C6 level: Secondary | ICD-10-CM | POA: Diagnosis not present

## 2017-09-08 DIAGNOSIS — F411 Generalized anxiety disorder: Secondary | ICD-10-CM | POA: Diagnosis not present

## 2017-09-08 DIAGNOSIS — M5136 Other intervertebral disc degeneration, lumbar region: Secondary | ICD-10-CM | POA: Diagnosis not present

## 2017-09-08 DIAGNOSIS — M50923 Unspecified cervical disc disorder at C6-C7 level: Secondary | ICD-10-CM | POA: Diagnosis not present

## 2017-09-09 MED ORDER — HYDROCORTISONE ACETATE 30 MG RE SUPP: 1.0000 | Freq: Two times a day (BID) | RECTAL | 0 refills | Status: DC | PRN

## 2017-09-10 NOTE — Progress Notes (Signed)
Subjective:   [facility] was seen in consultation in the movement disorder clinic at the request of [name], MD.  The evaluation is for tremor.  The records that were made available to me were reviewed. Tremor started approximately 12 years ago and involves the right hand.  He saw a neurologist in [place], [place] about 10 years ago and was diagnosed with essential tremor.   He was placed on gabapentin for tremor and neuropathy.  Pt doesn't notice neuropathy at all.   Tremor is most noticeable when writing or eating.  He never notices it when at rest.  Tremor bothers him when people are watching him.   There is a family hx of tremor in his maternal grandfather but he had EtOHism as well  Affected by caffeine:  No.  Affected by alcohol:  Doesn't drink Affected by stress:  Yes.   Affected by fatigue:  No. Spills soup if on spoon:  Yes.  , but able to do it with the L hand Affects ADL's (tying shoes, brushing teeth, etc):  No. (shaves with electric razor)  Current/Previously tried tremor medications: Gabapentin, 600 mg twice per day (did not tolerate higher dosages); on carvedilol for cardiac purposes  Current medications that may exacerbate tremor:  n/a  Outside reports reviewed: historical medical records, lab reports, office notes and referral letter/letters.  No neuroimaging of brain for review  Allergies  Allergen Reactions  . Sulfa Antibiotics Rash    Outpatient Encounter Medications as of 09/14/2017  Medication Sig  . acetaminophen (TYLENOL) 500 MG tablet Take 500 mg by mouth every 6 (six) hours as needed for moderate pain.  Marland Kitchen ALPRAZolam (XANAX) 0.25 MG tablet Take 0.25 mg by mouth daily.   Marland Kitchen aspirin 81 MG tablet Take 81 mg by mouth daily.  Marland Kitchen atorvastatin (LIPITOR) 80 MG tablet TAKE 1 TABLET BY MOUTH EVERY DAY  . Bismuth Tribromoph-Petrolatum (XEROFORM PETROLAT PATCH 4"X4") PADS USE AS DIRECTED TWICE A DAY EXTERNALLY 30 DAYS  . carvedilol (COREG) 12.5 MG tablet TAKE 1  TABLET (12.5 MG TOTAL) BY MOUTH 2 (TWO) TIMES DAILY WITH A MEAL.  . celecoxib (CELEBREX) 200 MG capsule Take 200 mg by mouth daily.   . clonazePAM (KLONOPIN) 0.5 MG tablet Take 0.5 mg by mouth at bedtime.   . Dermatological Products, Misc. (LEVICYN) GEL Apply 1 application topically 2 (two) times daily after a meal.  . Dermatological Products, Misc. (TETRIX) CREA Apply topically See admin instructions.  Marland Kitchen ELIDEL 1 % cream APPLY TO AFFECTED AREA TWICE A DAY AS NEEDED FOR 14 DAYS  . escitalopram (LEXAPRO) 20 MG tablet Take 20 mg by mouth daily.  Marland Kitchen ezetimibe (ZETIA) 10 MG tablet TAKE 1 TABLET (10 MG TOTAL) BY MOUTH DAILY.  . fluticasone (FLONASE) 50 MCG/ACT nasal spray INHALE 2 SPRAYS IN EACH NOSTRIL EVERY DAY  . gabapentin (NEURONTIN) 600 MG tablet TAKE 1 TABLET BY MOUTH TWICE A DAY  . HYDROCORTISONE ACE, RECTAL, 30 MG SUPP Place 1 suppository (30 mg total) rectally 2 (two) times daily as needed.  Marland Kitchen lisinopril (PRINIVIL,ZESTRIL) 20 MG tablet Take 20 mg by mouth daily.  Marland Kitchen LOTEMAX 0.5 % GEL   . pantoprazole (PROTONIX) 40 MG tablet TAKE 1 TABLET (40 MG TOTAL) BY MOUTH DAILY.  Marland Kitchen RESTASIS 0.05 % ophthalmic emulsion Place 1 drop into both eyes 2 (two) times daily.   . tacrolimus (PROTOPIC) 0.1 % ointment   . tamsulosin (FLOMAX) 0.4 MG CAPS capsule TAKE 1 CAPSULE (0.4 MG TOTAL) BY MOUTH  DAILY AFTER SUPPER.  Marland Kitchen triamcinolone cream (KENALOG) 0.1 % Apply 1 application topically 2 (two) times daily.  . urea (CARMOL) 10 % cream APPLY TO AFFECTED AREA TWICE A DAY AS NEEDED  . Urea 40 % LOTN Apply 1 application topically 2 (two) times a week.  . VOLTAREN 1 % GEL Apply 2 g topically daily.   Marland Kitchen zolpidem (AMBIEN) 10 MG tablet Take 10 mg by mouth at bedtime as needed for sleep.   No facility-administered encounter medications on file as of 09/14/2017.     Past Medical History:  Diagnosis Date  . Arthritis   . CAD (coronary artery disease) 6/14   Cardiac catheterization 6/27/ 2014 ejection fraction 35-40%, 30%  proximal left circumflex, 100% tiny obtuse marginal 1 with collaterals, 50% LAD, 50% D1, 100% RCA with collaterals  . Chicken pox   . Colon polyps   . Depression   . Diabetes mellitus ([place])    Diet controlled  . GERD (gastroesophageal reflux disease)   . Hyperlipidemia   . Hypertension   . Kidney stone     Past Surgical History:  Procedure Laterality Date  . TONSILLECTOMY  1963    Social History   Socioeconomic History  . Marital status: Married    Spouse name: Not on file  . Number of children: 3  . Years of education: Not on file  . Highest education level: Not on file  Occupational History  . Occupation: retired    Comment: Retired  Scientific laboratory technician  . Financial resource strain: Not on file  . Food insecurity:    Worry: Not on file    Inability: Not on file  . Transportation needs:    Medical: Not on file    Non-medical: Not on file  Tobacco Use  . Smoking status: Former Smoker    Last attempt to quit: 09/15/2002    Years since quitting: 15.0  . Smokeless tobacco: Never Used  . Tobacco comment: quit 15 years ago -socially  Substance and Sexual Activity  . Alcohol use: No    Alcohol/week: 0.0 oz  . Drug use: No  . Sexual activity: Not on file  Lifestyle  . Physical activity:    Days per week: Not on file    Minutes per session: Not on file  . Stress: Not on file  Relationships  . Social connections:    Talks on phone: Not on file    Gets together: Not on file    Attends religious service: Not on file    Active member of club or organization: Not on file    Attends meetings of clubs or organizations: Not on file    Relationship status: Not on file  . Intimate partner violence:    Fear of current or ex partner: Not on file    Emotionally abused: Not on file    Physically abused: Not on file    Forced sexual activity: Not on file  Other Topics Concern  . Not on file  Social History Narrative  . Not on file    Family Status  Relation Name Status  .  Mother  Deceased  . Father  Deceased  . MGM  Deceased  . PGM  Deceased  . PGF  Deceased  . MGF  Deceased  . Son  Alive  . Neg Hx  (Not Specified)    Review of Systems Review of Systems  Constitutional: Negative.   HENT: Negative.   Eyes: Negative.   Respiratory: Negative.  Cardiovascular: Negative.   Gastrointestinal: Negative.   Genitourinary: Negative.   Musculoskeletal: Negative.   Skin: Negative.   Neurological: Positive for tremors.  Endo/Heme/Allergies: Negative.   Psychiatric/Behavioral: The patient is nervous/anxious.      Objective:   VITALS:   Vitals:   09/14/17 1014  BP: 128/66  Pulse: 66  SpO2: 98%  Weight: 214 lb (97.1 kg)  Height: 6\' 2"  (1.88 m)   Gen:  Appears stated age and in NAD. HEENT:  Normocephalic, atraumatic. The mucous membranes are moist. The superficial temporal arteries are without ropiness or tenderness. Cardiovascular: Regular rate and rhythm. Lungs: Clear to auscultation bilaterally. Neck: There are no carotid bruits noted bilaterally.  NEUROLOGICAL:  Orientation:  The patient is alert and oriented x 3.  Recent and remote memory are intact.  Attention span and concentration are normal.  Able to name objects and repeat without trouble.  Fund of knowledge is appropriate Cranial nerves: There is good facial symmetry. The pupils are equal round and reactive to light bilaterally. Fundoscopic exam reveals clear disc margins bilaterally. Extraocular muscles are intact and visual fields are full to confrontational testing. Speech is fluent and clear. Soft palate rises symmetrically and there is no tongue deviation. Hearing is intact to conversational tone. Tone: Tone is good throughout. Sensation: Sensation is intact to light touch and pinprick throughout (facial, trunk, extremities). Vibration is intact at the bilateral big toe. There is no extinction with double simultaneous stimulation. There is no sensory dermatomal level  identified. Coordination:  The patient has no dysdiadichokinesia or dysmetria. Motor: Strength is 5/5 in the bilateral upper and lower extremities.  Shoulder shrug is equal bilaterally.  There is no pronator drift.  There are no fasciculations noted. DTR's: Deep tendon reflexes are 2/4 at the bilateral biceps, triceps, brachioradialis, patella and achilles.  Plantar responses are downgoing bilaterally. Gait and Station: The patient is able to ambulate without difficulty. The patient is able to heel toe walk without any difficulty. The patient is able to ambulate in a tandem fashion. The patient is able to stand in the Romberg position.   MOVEMENT EXAM: Tremor:  There is intention tremor on the right.  It is worse when he is given a weight.  He has trouble getting his hand on the paper with the right hand for Archimedes spirals.  There is almost no tremor on the left hand.  He has trouble pouring water from one glass to another when the full glass of water is in the right hand.  Lab Results  Component Value Date   TSH 1.41 03/04/2017     Chemistry      Component Value Date/Time   NA 141 03/04/2017 0802   K 4.6 03/04/2017 0802   CL 104 03/04/2017 0802   CO2 30 03/04/2017 0802   BUN 20 03/04/2017 0802   CREATININE 1.04 03/04/2017 0802   CREATININE 1.04 07/19/2015 1111      Component Value Date/Time   CALCIUM 9.1 03/04/2017 0802   ALKPHOS 66 03/04/2017 0802   AST 19 03/04/2017 0802   ALT 19 03/04/2017 0802   BILITOT 0.5 03/04/2017 0802          Assessment/Plan:   1.  Essential Tremor.  - We discussed nature and pathophysiology.  We discussed that this can continue to gradually get worse with time.  We discussed that this can be asymmetric and 30% of cases, such as his.  We discussed that some medications can worsen this, as can caffeine use.  We discussed medication therapy as well as surgical therapy.  He is not interested in surgical therapy.  We discussed primidone in detail.   Ultimately, the patient decided to hold off on any medication.  He just wanted reassurance that this was not a neurodegenerative process, since his was so asymmetric.  I reassured him that I did not see evidence of a neurodegenerative process, such as Parkinson's disease.  Patient education was provided.  We did discuss things like weighted gloves.  He will let me know if he needs me in the future.  He will follow-up with me on an as-needed basis.  CC:  [name], MD

## 2017-09-11 DIAGNOSIS — M5412 Radiculopathy, cervical region: Secondary | ICD-10-CM | POA: Diagnosis not present

## 2017-09-11 DIAGNOSIS — M5010 Cervical disc disorder with radiculopathy, unspecified cervical region: Secondary | ICD-10-CM | POA: Diagnosis not present

## 2017-09-11 DIAGNOSIS — M5033 Other cervical disc degeneration, cervicothoracic region: Secondary | ICD-10-CM | POA: Diagnosis not present

## 2017-09-14 ENCOUNTER — Ambulatory Visit (INDEPENDENT_AMBULATORY_CARE_PROVIDER_SITE_OTHER): Payer: BLUE CROSS/BLUE SHIELD | Admitting: Neurology

## 2017-09-14 ENCOUNTER — Encounter: Payer: Self-pay | Admitting: Neurology

## 2017-09-14 ENCOUNTER — Encounter

## 2017-09-14 ENCOUNTER — Ambulatory Visit: Payer: BLUE CROSS/BLUE SHIELD | Admitting: Neurology

## 2017-09-14 VITALS — BP 128/66 | HR 66 | Ht 74.0 in | Wt 214.0 lb

## 2017-09-14 DIAGNOSIS — G250 Essential tremor: Secondary | ICD-10-CM

## 2017-09-15 ENCOUNTER — Ambulatory Visit (INDEPENDENT_AMBULATORY_CARE_PROVIDER_SITE_OTHER): Payer: BLUE CROSS/BLUE SHIELD | Admitting: Family Medicine

## 2017-09-15 ENCOUNTER — Encounter: Payer: Self-pay | Admitting: Family Medicine

## 2017-09-15 VITALS — BP 100/70 | HR 65 | Temp 97.5°F | Ht 74.0 in | Wt 213.6 lb

## 2017-09-15 DIAGNOSIS — R300 Dysuria: Secondary | ICD-10-CM | POA: Diagnosis not present

## 2017-09-15 LAB — POCT URINALYSIS DIPSTICK
Bilirubin, UA: NEGATIVE
Blood, UA: NEGATIVE
GLUCOSE UA: NEGATIVE
Ketones, UA: NEGATIVE
LEUKOCYTES UA: NEGATIVE
Nitrite, UA: NEGATIVE
Protein, UA: NEGATIVE
SPEC GRAV UA: 1.01 (ref 1.010–1.025)
Urobilinogen, UA: 0.2 E.U./dL
pH, UA: 6 (ref 5.0–8.0)

## 2017-09-15 MED ORDER — AZELASTINE HCL 0.1 % NA SOLN: 1.0000 | Freq: Two times a day (BID) | NASAL | 12 refills | Status: DC

## 2017-09-15 NOTE — Progress Notes (Signed)
  Subjective:     Patient ID: Peter Snyder, male   DOB: [birth year], 62 y.o.   MRN: [id number]  HPI Patient seen for the following issues  He's had some nasal congestion, postnasal drip, chest congestion with occasional cough. This has not limited his walking. He walked 2 miles this morning at a very fast pace. No recent chest pains. No fevers or chills. He has perennial allergies. Already takes Flonase for that. Still had some sneezing and nasal drip symptoms. No facial pain.  Second issue is he has had some pain occasionally very intermittently at the end of urination. No actual burning while urinating. Denies urine frequency. No gross hematuria. No flank pain. Remote history of kidney stones  Past Medical History:  Diagnosis Date  . Arthritis   . CAD (coronary artery disease) 6/14   Cardiac catheterization 6/27/ 2014 ejection fraction 35-40%, 30% proximal left circumflex, 100% tiny obtuse marginal 1 with collaterals, 50% LAD, 50% D1, 100% RCA with collaterals  . Chicken pox   . Colon polyps   . Depression   . Diabetes mellitus ([place])    Diet controlled  . GERD (gastroesophageal reflux disease)   . Hyperlipidemia   . Hypertension   . Kidney stone    Past Surgical History:  Procedure Laterality Date  . TONSILLECTOMY  1963    reports that he quit smoking about 15 years ago. He has never used smokeless tobacco. He reports that he does not drink alcohol or use drugs. family history includes Arthritis in his father and paternal grandmother; Clotting disorder in his paternal grandfather and paternal grandmother; Colon polyps in his father; Heart disease in his maternal grandmother; Hyperlipidemia in his maternal grandmother; Liver cancer in his paternal grandfather; Melanoma in his mother. Allergies  Allergen Reactions  . Sulfa Antibiotics Rash     Review of Systems  Constitutional: Negative for chills and fever.  HENT: Positive for congestion, postnasal drip and rhinorrhea. Negative  for sinus pain.   Respiratory: Positive for cough.   Genitourinary: Positive for dysuria. Negative for difficulty urinating, discharge, flank pain and hematuria.       Objective:   Physical Exam  Constitutional: He appears well-developed and well-nourished.  HENT:  Right Ear: External ear normal.  Left Ear: External ear normal.  Slightly erythematous nasal mucosa. No nasal polyps.  Neck: Neck supple.  Cardiovascular: Normal rate and regular rhythm.  Pulmonary/Chest: Effort normal and breath sounds normal. He has no wheezes. He has no rales.  Lymphadenopathy:    He has no cervical adenopathy.       Assessment:     #1 rhinorrhea. Suspect allergic.  #2 occasional dysuria. He describes what sounds like possible urethra spasm at the end of urination. Urine dipstick today completely normal. No evidence for infection. Recent PSA normal. No obstructive urinary symptoms    Plan:     -Add Astelin nasal 1-2 sprays per nostril twice daily as needed. Continue Flonase. -Stay well-hydrated. Offered antispasm drugs for urinary tract but this point he wishes to observe  [name] MD [facility]

## 2017-09-15 NOTE — Patient Instructions (Signed)

## 2017-09-16 DIAGNOSIS — M25511 Pain in right shoulder: Secondary | ICD-10-CM | POA: Diagnosis not present

## 2017-09-16 DIAGNOSIS — M50922 Unspecified cervical disc disorder at C5-C6 level: Secondary | ICD-10-CM | POA: Diagnosis not present

## 2017-09-16 DIAGNOSIS — M5136 Other intervertebral disc degeneration, lumbar region: Secondary | ICD-10-CM | POA: Diagnosis not present

## 2017-09-16 DIAGNOSIS — M50923 Unspecified cervical disc disorder at C6-C7 level: Secondary | ICD-10-CM | POA: Diagnosis not present

## 2017-09-22 DIAGNOSIS — M5136 Other intervertebral disc degeneration, lumbar region: Secondary | ICD-10-CM | POA: Diagnosis not present

## 2017-09-22 DIAGNOSIS — M50923 Unspecified cervical disc disorder at C6-C7 level: Secondary | ICD-10-CM | POA: Diagnosis not present

## 2017-09-22 DIAGNOSIS — M50922 Unspecified cervical disc disorder at C5-C6 level: Secondary | ICD-10-CM | POA: Diagnosis not present

## 2017-09-22 DIAGNOSIS — M25511 Pain in right shoulder: Secondary | ICD-10-CM | POA: Diagnosis not present

## 2017-09-28 DIAGNOSIS — M25511 Pain in right shoulder: Secondary | ICD-10-CM | POA: Diagnosis not present

## 2017-09-28 DIAGNOSIS — M5136 Other intervertebral disc degeneration, lumbar region: Secondary | ICD-10-CM | POA: Diagnosis not present

## 2017-09-28 DIAGNOSIS — M50923 Unspecified cervical disc disorder at C6-C7 level: Secondary | ICD-10-CM | POA: Diagnosis not present

## 2017-09-28 DIAGNOSIS — M50922 Unspecified cervical disc disorder at C5-C6 level: Secondary | ICD-10-CM | POA: Diagnosis not present

## 2017-09-30 DIAGNOSIS — M5136 Other intervertebral disc degeneration, lumbar region: Secondary | ICD-10-CM | POA: Diagnosis not present

## 2017-09-30 DIAGNOSIS — Z713 Dietary counseling and surveillance: Secondary | ICD-10-CM | POA: Diagnosis not present

## 2017-09-30 DIAGNOSIS — M50922 Unspecified cervical disc disorder at C5-C6 level: Secondary | ICD-10-CM | POA: Diagnosis not present

## 2017-09-30 DIAGNOSIS — M50923 Unspecified cervical disc disorder at C6-C7 level: Secondary | ICD-10-CM | POA: Diagnosis not present

## 2017-09-30 DIAGNOSIS — M25511 Pain in right shoulder: Secondary | ICD-10-CM | POA: Diagnosis not present

## 2017-10-05 DIAGNOSIS — D485 Neoplasm of uncertain behavior of skin: Secondary | ICD-10-CM | POA: Diagnosis not present

## 2017-10-05 DIAGNOSIS — L820 Inflamed seborrheic keratosis: Secondary | ICD-10-CM | POA: Diagnosis not present

## 2017-10-05 DIAGNOSIS — M5136 Other intervertebral disc degeneration, lumbar region: Secondary | ICD-10-CM | POA: Diagnosis not present

## 2017-10-05 DIAGNOSIS — M50922 Unspecified cervical disc disorder at C5-C6 level: Secondary | ICD-10-CM | POA: Diagnosis not present

## 2017-10-05 DIAGNOSIS — R202 Paresthesia of skin: Secondary | ICD-10-CM | POA: Diagnosis not present

## 2017-10-05 DIAGNOSIS — M25511 Pain in right shoulder: Secondary | ICD-10-CM | POA: Diagnosis not present

## 2017-10-05 DIAGNOSIS — M50923 Unspecified cervical disc disorder at C6-C7 level: Secondary | ICD-10-CM | POA: Diagnosis not present

## 2017-10-07 DIAGNOSIS — M79645 Pain in left finger(s): Secondary | ICD-10-CM | POA: Diagnosis not present

## 2017-10-07 DIAGNOSIS — M65332 Trigger finger, left middle finger: Secondary | ICD-10-CM | POA: Diagnosis not present

## 2017-10-08 DIAGNOSIS — M7751 Other enthesopathy of right foot: Secondary | ICD-10-CM | POA: Diagnosis not present

## 2017-10-08 DIAGNOSIS — G5761 Lesion of plantar nerve, right lower limb: Secondary | ICD-10-CM | POA: Diagnosis not present

## 2017-10-08 DIAGNOSIS — M25571 Pain in right ankle and joints of right foot: Secondary | ICD-10-CM | POA: Diagnosis not present

## 2017-10-09 ENCOUNTER — Other Ambulatory Visit: Payer: Self-pay | Admitting: Family Medicine

## 2017-10-12 DIAGNOSIS — M25511 Pain in right shoulder: Secondary | ICD-10-CM | POA: Diagnosis not present

## 2017-10-12 DIAGNOSIS — M50922 Unspecified cervical disc disorder at C5-C6 level: Secondary | ICD-10-CM | POA: Diagnosis not present

## 2017-10-12 DIAGNOSIS — M5136 Other intervertebral disc degeneration, lumbar region: Secondary | ICD-10-CM | POA: Diagnosis not present

## 2017-10-12 DIAGNOSIS — M50923 Unspecified cervical disc disorder at C6-C7 level: Secondary | ICD-10-CM | POA: Diagnosis not present

## 2017-10-15 DIAGNOSIS — M25562 Pain in left knee: Secondary | ICD-10-CM | POA: Diagnosis not present

## 2017-10-19 DIAGNOSIS — M50923 Unspecified cervical disc disorder at C6-C7 level: Secondary | ICD-10-CM | POA: Diagnosis not present

## 2017-10-19 DIAGNOSIS — M50922 Unspecified cervical disc disorder at C5-C6 level: Secondary | ICD-10-CM | POA: Diagnosis not present

## 2017-10-19 DIAGNOSIS — M25511 Pain in right shoulder: Secondary | ICD-10-CM | POA: Diagnosis not present

## 2017-10-19 DIAGNOSIS — M5136 Other intervertebral disc degeneration, lumbar region: Secondary | ICD-10-CM | POA: Diagnosis not present

## 2017-10-22 DIAGNOSIS — F411 Generalized anxiety disorder: Secondary | ICD-10-CM | POA: Diagnosis not present

## 2017-10-26 DIAGNOSIS — M50922 Unspecified cervical disc disorder at C5-C6 level: Secondary | ICD-10-CM | POA: Diagnosis not present

## 2017-10-26 DIAGNOSIS — M50923 Unspecified cervical disc disorder at C6-C7 level: Secondary | ICD-10-CM | POA: Diagnosis not present

## 2017-10-26 DIAGNOSIS — M5136 Other intervertebral disc degeneration, lumbar region: Secondary | ICD-10-CM | POA: Diagnosis not present

## 2017-10-26 DIAGNOSIS — M25511 Pain in right shoulder: Secondary | ICD-10-CM | POA: Diagnosis not present

## 2017-10-27 DIAGNOSIS — D044 Carcinoma in situ of skin of scalp and neck: Secondary | ICD-10-CM | POA: Diagnosis not present

## 2017-11-02 ENCOUNTER — Ambulatory Visit (INDEPENDENT_AMBULATORY_CARE_PROVIDER_SITE_OTHER): Payer: BLUE CROSS/BLUE SHIELD | Admitting: Family Medicine

## 2017-11-02 VITALS — BP 110/66 | HR 65 | Temp 98.3°F | Wt 217.1 lb

## 2017-11-02 DIAGNOSIS — K120 Recurrent oral aphthae: Secondary | ICD-10-CM

## 2017-11-02 DIAGNOSIS — M5136 Other intervertebral disc degeneration, lumbar region: Secondary | ICD-10-CM | POA: Diagnosis not present

## 2017-11-02 DIAGNOSIS — M5030 Other cervical disc degeneration, unspecified cervical region: Secondary | ICD-10-CM | POA: Diagnosis not present

## 2017-11-02 DIAGNOSIS — M47816 Spondylosis without myelopathy or radiculopathy, lumbar region: Secondary | ICD-10-CM | POA: Diagnosis not present

## 2017-11-02 MED ORDER — DEXAMETHASONE 0.5 MG/5ML PO ELIX: ORAL_SOLUTION | ORAL | 2 refills | Status: DC

## 2017-11-02 NOTE — Progress Notes (Signed)
  Subjective:     Patient ID: Peter Snyder, male   DOB: 23-Jan-1956, 62 y.o.   MRN: [id number]  HPI Patient seen with 3 day history of small aphthous type ulcer underneath the tongue on the left side. He's had similar ulcers in the past. No sore throat. No fevers or chills. No appetite changes. Otherwise feels well. Quit smoking 2004. No oral tobacco use  Past Medical History:  Diagnosis Date  . Arthritis   . CAD (coronary artery disease) 6/14   Cardiac catheterization 6/27/ 2014 ejection fraction 35-40%, 30% proximal left circumflex, 100% tiny obtuse marginal 1 with collaterals, 50% LAD, 50% D1, 100% RCA with collaterals  . Chicken pox   . Colon polyps   . Depression   . Diabetes mellitus ([place])    Diet controlled  . GERD (gastroesophageal reflux disease)   . Hyperlipidemia   . Hypertension   . Kidney stone    Past Surgical History:  Procedure Laterality Date  . TONSILLECTOMY  1963    reports that he quit smoking about 15 years ago. He has never used smokeless tobacco. He reports that he does not drink alcohol or use drugs. family history includes Arthritis in his father and paternal grandmother; Clotting disorder in his paternal grandfather and paternal grandmother; Colon polyps in his father; Heart disease in his maternal grandmother; Hyperlipidemia in his maternal grandmother; Liver cancer in his paternal grandfather; Melanoma in his mother. Allergies  Allergen Reactions  . Sulfa Antibiotics Rash     Review of Systems  Constitutional: Negative for appetite change and unexpected weight change.  HENT: Negative for sore throat.   Hematological: Negative for adenopathy.       Objective:   Physical Exam  Constitutional: He appears well-developed and well-nourished.  HENT:  One small aphthous type ulcer left sublingual area. No atypical features. No induration.  Neck: Neck supple.  Cardiovascular: Normal rate and regular rhythm.  Pulmonary/Chest: Effort normal and breath  sounds normal.  Lymphadenopathy:    He has no cervical adenopathy.       Assessment:     Benign-appearing small aphthous ulcer left sublingual area    Plan:     -We refilled dexamethasone elixir 0.5 mg per 5 mL 2-3 teaspoons swish and spit 4 times daily as needed. -Follow-up as needed-if ulcer not fully healed couple weeks  [name] MD [facility]

## 2017-11-02 NOTE — Patient Instructions (Signed)
Oral Ulcers Oral ulcers are sores inside the mouth or near the mouth. They may be called canker sores or cold sores, which are two types of oral ulcers. Many oral ulcers are harmless and go away on their own. In some cases, oral ulcers may require medical care to determine the cause and proper treatment. What are the causes? Common causes of this condition include:  Viral, bacterial, or fungal infection.  Emotional stress.  Foods or chemicals that irritate the mouth.  Injury or physical irritation of the mouth.  Medicines.  Allergies.  Tobacco use.  Less common causes include:  Skin disease.  A type of herpes virus infection (herpes simplexor herpes zoster).  Oral cancer.  In some cases, the cause of this condition may not be known. What increases the risk? Oral ulcers are more likely to develop in:  People who wear dental braces, dentures, or retainers.  People who do not keep their mouth clean or brush their teeth regularly.  People who have sensitive skin.  People who have conditions that affect the entire body (systemic conditions), such as immune disorders.  What are the signs or symptoms? The main symptom of this condition is one or more oval-shaped or round ulcers that have red borders. Details about symptoms may vary depending on the cause.  Location of the ulcers. They may be inside the mouth, on the gums, or on the insides of the lips or cheeks. They may also be on the lips or on skin that is near the mouth, such as the cheeks and chin.  Pain. Ulcers can be painful and uncomfortable, or they can be painless.  Appearance of the ulcers. They may look like red blisters and be filled with fluid, or they may be white or yellow patches.  Frequency of outbreaks. Ulcers may go away permanently after one outbreak, or they may come back (recur) often or rarely.  How is this diagnosed? This condition is diagnosed with a physical exam. Your health care provider may  ask you questions about your lifestyle and your medical history. You may have tests, including:  Blood tests.  Removal of a small number of cells from an ulcer to be examined under a microscope (biopsy).  How is this treated? This condition is treated by managing any pain and discomfort, and by treating the underlying cause of the ulcers, if necessary. Usually, oral ulcers resolve by themselves in 1-2 weeks. You may be told to keep your mouth clean and avoid things that cause or irritate your ulcers. Your health care provider may prescribe medicines to reduce pain and discomfort or treat the underlying cause, if this applies. Follow these instructions at home: Lifestyle  Follow instructions from your health care provider about eating or drinking restrictions. ? Drink enough fluid to keep your urine clear or pale yellow. ? Avoid foods and drinks that irritate your ulcers.  Avoid tobacco products, including cigarettes, chewing tobacco, or e-cigarettes. If you need help quitting, ask your health care provider.  Avoid excessive alcohol use. Oral Hygiene  Avoid physical or chemical irritants that may have caused the ulcers or made them worse, such as mouthwashes that contain alcohol (ethanol). If you wear dental braces, dentures, or retainers, work with your health care provider to make sure these devices are fitted correctly.  Brush and floss your teeth at least once every day, and get regular dental cleanings and checkups.  Gargle with a salt-water mixture 3-4 times per day or as told by your health  care provider. To make a salt-water mixture, completely dissolve -1 tsp of salt in 1 cup of warm water. General instructions  Take over-the-counter and prescription medicines only as told by your health care provider.  If you have pain, wrap a cold compress in a towel and gently press it against your face to help reduce pain.  Keep all follow-up visits as told by your health care provider.  This is important. Contact a health care provider if:  You have pain that gets worse or does not get better with medicine.  You have 4 or more ulcers at one time.  You have a fever.  You have new ulcers that look or feel different from other ulcers you have.  You have inflammation in one eye or both eyes.  You have ulcers that do not go away after 10 days.  You develop new symptoms in your mouth, such as: ? Bleeding or crusting around your lips or gums. ? Tooth pain. ? Difficulty swallowing.  You develop symptoms on your skin or genitals, such as: ? A rash or blisters. ? Burning or itching sensations.  Your ulcers begin or get worse after you start a new medicine. Get help right away if:  You have difficulty breathing.  You have swelling in your face or neck.  You have excessive bleeding from your mouth.  You have severe pain. This information is not intended to replace advice given to you by your health care provider. Make sure you discuss any questions you have with your health care provider. Document Released: 04/10/2004 Document Revised: 08/06/2015 Document Reviewed: 07/19/2014 Elsevier Interactive Patient Education  [employer].

## 2017-11-03 DIAGNOSIS — M50923 Unspecified cervical disc disorder at C6-C7 level: Secondary | ICD-10-CM | POA: Diagnosis not present

## 2017-11-03 DIAGNOSIS — M25511 Pain in right shoulder: Secondary | ICD-10-CM | POA: Diagnosis not present

## 2017-11-03 DIAGNOSIS — G5761 Lesion of plantar nerve, right lower limb: Secondary | ICD-10-CM | POA: Diagnosis not present

## 2017-11-03 DIAGNOSIS — M65871 Other synovitis and tenosynovitis, right ankle and foot: Secondary | ICD-10-CM | POA: Diagnosis not present

## 2017-11-03 DIAGNOSIS — M5136 Other intervertebral disc degeneration, lumbar region: Secondary | ICD-10-CM | POA: Diagnosis not present

## 2017-11-03 DIAGNOSIS — M50922 Unspecified cervical disc disorder at C5-C6 level: Secondary | ICD-10-CM | POA: Diagnosis not present

## 2017-11-09 DIAGNOSIS — M50923 Unspecified cervical disc disorder at C6-C7 level: Secondary | ICD-10-CM | POA: Diagnosis not present

## 2017-11-09 DIAGNOSIS — M5136 Other intervertebral disc degeneration, lumbar region: Secondary | ICD-10-CM | POA: Diagnosis not present

## 2017-11-09 DIAGNOSIS — M25511 Pain in right shoulder: Secondary | ICD-10-CM | POA: Diagnosis not present

## 2017-11-09 DIAGNOSIS — Z713 Dietary counseling and surveillance: Secondary | ICD-10-CM | POA: Diagnosis not present

## 2017-11-09 DIAGNOSIS — M50922 Unspecified cervical disc disorder at C5-C6 level: Secondary | ICD-10-CM | POA: Diagnosis not present

## 2017-11-10 DIAGNOSIS — M5136 Other intervertebral disc degeneration, lumbar region: Secondary | ICD-10-CM | POA: Diagnosis not present

## 2017-11-11 DIAGNOSIS — H1045 Other chronic allergic conjunctivitis: Secondary | ICD-10-CM | POA: Diagnosis not present

## 2017-11-12 DIAGNOSIS — M50922 Unspecified cervical disc disorder at C5-C6 level: Secondary | ICD-10-CM | POA: Diagnosis not present

## 2017-11-12 DIAGNOSIS — M25511 Pain in right shoulder: Secondary | ICD-10-CM | POA: Diagnosis not present

## 2017-11-12 DIAGNOSIS — M5136 Other intervertebral disc degeneration, lumbar region: Secondary | ICD-10-CM | POA: Diagnosis not present

## 2017-11-12 DIAGNOSIS — M50923 Unspecified cervical disc disorder at C6-C7 level: Secondary | ICD-10-CM | POA: Diagnosis not present

## 2017-11-13 ENCOUNTER — Ambulatory Visit (INDEPENDENT_AMBULATORY_CARE_PROVIDER_SITE_OTHER): Payer: BLUE CROSS/BLUE SHIELD | Admitting: Family Medicine

## 2017-11-13 ENCOUNTER — Encounter: Payer: Self-pay | Admitting: Family Medicine

## 2017-11-13 VITALS — BP 98/58 | HR 82 | Temp 97.6°F | Ht 74.0 in | Wt 213.2 lb

## 2017-11-13 DIAGNOSIS — L290 Pruritus ani: Secondary | ICD-10-CM

## 2017-11-13 NOTE — Progress Notes (Signed)
   Subjective:    Patient ID: Peter Snyder, male    DOB: 07/15/1955, 62 y.o.   MRN: [id number]  HPI Here for 2 days of irritation at the anus. He has this from time to time and it seems to be triggered by constipation. There has been no bleeding. He drinks plenty of water.    Review of Systems  Constitutional: Negative.   Respiratory: Negative.   Cardiovascular: Negative.   Gastrointestinal: Positive for rectal pain.       Objective:   Physical Exam  Constitutional: He appears well-developed and well-nourished.  Cardiovascular: Normal rate, regular rhythm, normal heart sounds and intact distal pulses.  Pulmonary/Chest: Effort normal and breath sounds normal.  Abdominal: Soft. Bowel sounds are normal. He exhibits no distension and no mass. There is no tenderness. There is no rebound and no guarding. No hernia.  Genitourinary:  Genitourinary Comments: The median raphe at the anal verge is inflamed and tender. No hemorrhoids or fissures seen           Assessment & Plan:  Perianal irritation. He will try Preparation H as needed. I suggested he try Miralax daily to soften the stools.  Alysia Penna, MD

## 2017-11-17 ENCOUNTER — Ambulatory Visit (INDEPENDENT_AMBULATORY_CARE_PROVIDER_SITE_OTHER): Payer: BLUE CROSS/BLUE SHIELD | Admitting: Family Medicine

## 2017-11-17 ENCOUNTER — Encounter: Payer: Self-pay | Admitting: Family Medicine

## 2017-11-17 VITALS — BP 102/70 | HR 56 | Temp 97.7°F | Wt 214.0 lb

## 2017-11-17 DIAGNOSIS — L293 Anogenital pruritus, unspecified: Secondary | ICD-10-CM

## 2017-11-17 DIAGNOSIS — M25511 Pain in right shoulder: Secondary | ICD-10-CM | POA: Diagnosis not present

## 2017-11-17 DIAGNOSIS — Z23 Encounter for immunization: Secondary | ICD-10-CM

## 2017-11-17 DIAGNOSIS — M50923 Unspecified cervical disc disorder at C6-C7 level: Secondary | ICD-10-CM | POA: Diagnosis not present

## 2017-11-17 DIAGNOSIS — M50922 Unspecified cervical disc disorder at C5-C6 level: Secondary | ICD-10-CM | POA: Diagnosis not present

## 2017-11-17 DIAGNOSIS — M5136 Other intervertebral disc degeneration, lumbar region: Secondary | ICD-10-CM | POA: Diagnosis not present

## 2017-11-17 MED ORDER — TRIAMCINOLONE ACETONIDE 0.1 % EX CREA: 1.0000 "application " | TOPICAL_CREAM | Freq: Two times a day (BID) | CUTANEOUS | 1 refills | Status: DC | PRN

## 2017-11-17 NOTE — Progress Notes (Signed)
  Subjective:     Patient ID: Peter Snyder, male   DOB: 02/01/1956, 62 y.o.   MRN: [id number]  HPI Patient seen with some recent irritation of the perineum region. He was seen here Friday for that. He had some evidence for inflammation at the  median raphae of the anal verge.  He had some recent constipation. Constipation symptoms improved with MiraLAX. Using Preparation H without much improvement. He states he had some severe pruritus over the weekend.  No other rash.  No exacerbating or alleviating factors  Past Medical History:  Diagnosis Date  . Arthritis   . CAD (coronary artery disease) 6/14   Cardiac catheterization 6/27/ 2014 ejection fraction 35-40%, 30% proximal left circumflex, 100% tiny obtuse marginal 1 with collaterals, 50% LAD, 50% D1, 100% RCA with collaterals  . Chicken pox   . Colon polyps   . Depression   . Diabetes mellitus ([place])    Diet controlled  . GERD (gastroesophageal reflux disease)   . Hyperlipidemia   . Hypertension   . Kidney stone    Past Surgical History:  Procedure Laterality Date  . TONSILLECTOMY  1963    reports that he quit smoking about 15 years ago. He has never used smokeless tobacco. He reports that he does not drink alcohol or use drugs. family history includes Arthritis in his father and paternal grandmother; Clotting disorder in his paternal grandfather and paternal grandmother; Colon polyps in his father; Heart disease in his maternal grandmother; Hyperlipidemia in his maternal grandmother; Liver cancer in his paternal grandfather; Melanoma in his mother. Allergies  Allergen Reactions  . Sulfa Antibiotics Rash    Review of Systems  Gastrointestinal: Negative for blood in stool, constipation and diarrhea.       Objective:   Physical Exam  Constitutional: He appears well-developed and well-nourished.  Cardiovascular: Normal rate.  Pulmonary/Chest: Effort normal and breath sounds normal.  Skin:  Mild inflammation of the median  raphae at the anal verge. No rash. No anal fissure. No external hemorrhoids.       Assessment:     Perineum irritation    Plan:     Triamcinolone 0.1% cream once or twice daily as needed for the next week or so but avoid prolonged use.  [name] MD [facility] at [facility]

## 2017-11-18 DIAGNOSIS — M5416 Radiculopathy, lumbar region: Secondary | ICD-10-CM | POA: Diagnosis not present

## 2017-11-19 DIAGNOSIS — F411 Generalized anxiety disorder: Secondary | ICD-10-CM | POA: Diagnosis not present

## 2017-11-20 DIAGNOSIS — M5416 Radiculopathy, lumbar region: Secondary | ICD-10-CM | POA: Diagnosis not present

## 2017-11-23 DIAGNOSIS — M50922 Unspecified cervical disc disorder at C5-C6 level: Secondary | ICD-10-CM | POA: Diagnosis not present

## 2017-11-23 DIAGNOSIS — M25511 Pain in right shoulder: Secondary | ICD-10-CM | POA: Diagnosis not present

## 2017-11-23 DIAGNOSIS — M5136 Other intervertebral disc degeneration, lumbar region: Secondary | ICD-10-CM | POA: Diagnosis not present

## 2017-11-23 DIAGNOSIS — M50923 Unspecified cervical disc disorder at C6-C7 level: Secondary | ICD-10-CM | POA: Diagnosis not present

## 2017-11-25 ENCOUNTER — Ambulatory Visit (INDEPENDENT_AMBULATORY_CARE_PROVIDER_SITE_OTHER): Payer: BLUE CROSS/BLUE SHIELD | Admitting: Family Medicine

## 2017-11-25 ENCOUNTER — Encounter: Payer: Self-pay | Admitting: Family Medicine

## 2017-11-25 VITALS — BP 104/68 | HR 64 | Temp 98.3°F | Wt 215.6 lb

## 2017-11-25 DIAGNOSIS — R197 Diarrhea, unspecified: Secondary | ICD-10-CM

## 2017-11-25 DIAGNOSIS — L089 Local infection of the skin and subcutaneous tissue, unspecified: Secondary | ICD-10-CM

## 2017-11-25 NOTE — Patient Instructions (Signed)
Food Choices to Help Relieve Diarrhea, Adult When you have diarrhea, the foods you eat and your eating habits are very important. Choosing the right foods and drinks can help:  Relieve diarrhea.  Replace lost fluids and nutrients.  Prevent dehydration.  What general guidelines should I follow? Relieving diarrhea  Choose foods with less than 2 g or .07 oz. of fiber per serving.  Limit fats to less than 8 tsp (38 g or 1.34 oz.) a day.  Avoid the following: ? Foods and beverages sweetened with high-fructose corn syrup, honey, or sugar alcohols such as xylitol, sorbitol, and mannitol. ? Foods that contain a lot of fat or sugar. ? Fried, greasy, or spicy foods. ? High-fiber grains, breads, and cereals. ? Raw fruits and vegetables.  Eat foods that are rich in probiotics. These foods include dairy products such as yogurt and fermented milk products. They help increase healthy bacteria in the stomach and intestines (gastrointestinal tract, or GI tract).  If you have lactose intolerance, avoid dairy products. These may make your diarrhea worse.  Take medicine to help stop diarrhea (antidiarrheal medicine) only as told by your health care provider. Replacing nutrients  Eat small meals or snacks every 3-4 hours.  Eat bland foods, such as white rice, toast, or baked potato, until your diarrhea starts to get better. Gradually reintroduce nutrient-rich foods as tolerated or as told by your health care provider. This includes: ? Well-cooked protein foods. ? Peeled, seeded, and soft-cooked fruits and vegetables. ? Low-fat dairy products.  Take vitamin and mineral supplements as told by your health care provider. Preventing dehydration   Start by sipping water or a special solution to prevent dehydration (oral rehydration solution, ORS). Urine that is clear or pale yellow means that you are getting enough fluid.  Try to drink at least 8-10 cups of fluid each day to help replace lost  fluids.  You may add other liquids in addition to water, such as clear juice or decaffeinated sports drinks, as tolerated or as told by your health care provider.  Avoid drinks with caffeine, such as coffee, tea, or soft drinks.  Avoid alcohol. What foods are recommended? The items listed may not be a complete list. Talk with your health care provider about what dietary choices are best for you. Grains White rice. White, French, or pita breads (fresh or toasted), including plain rolls, buns, or bagels. White pasta. Saltine, soda, or graham crackers. Pretzels. Low-fiber cereal. Cooked cereals made with water (such as cornmeal, farina, or cream cereals). Plain muffins. Matzo. Melba toast. Zwieback. Vegetables Potatoes (without the skin). Most well-cooked and canned vegetables without skins or seeds. Tender lettuce. Fruits Apple sauce. Fruits canned in juice. Cooked apricots, cherries, grapefruit, peaches, pears, or plums. Fresh bananas and cantaloupe. Meats and other protein foods Baked or boiled chicken. Eggs. Tofu. Fish. Seafood. Smooth nut butters. Ground or well-cooked tender beef, ham, veal, lamb, pork, or poultry. Dairy Plain yogurt, kefir, and unsweetened liquid yogurt. Lactose-free milk, buttermilk, skim milk, or soy milk. Low-fat or nonfat hard cheese. Beverages Water. Low-calorie sports drinks. Fruit juices without pulp. Strained tomato and vegetable juices. Decaffeinated teas. Sugar-free beverages not sweetened with sugar alcohols. Oral rehydration solutions, if approved by your health care provider. Seasoning and other foods Bouillon, broth, or soups made from recommended foods. What foods are not recommended? The items listed may not be a complete list. Talk with your health care provider about what dietary choices are best for you. Grains Whole grain, whole wheat,   bran, or rye breads, rolls, pastas, and crackers. Wild or brown rice. Whole grain or bran cereals. Barley. Oats and  oatmeal. Corn tortillas or taco shells. Granola. Popcorn. Vegetables Raw vegetables. Fried vegetables. Cabbage, broccoli, Brussels sprouts, artichokes, baked beans, beet greens, corn, kale, legumes, peas, sweet potatoes, and yams. Potato skins. Cooked spinach and cabbage. Fruits Dried fruit, including raisins and dates. Raw fruits. Stewed or dried prunes. Canned fruits with syrup. Meat and other protein foods Fried or fatty meats. Deli meats. Chunky nut butters. Nuts and seeds. Beans and lentils. Berniece Salines. Hot dogs. Sausage. Dairy High-fat cheeses. Whole milk, chocolate milk, and beverages made with milk, such as milk shakes. Half-and-half. Cream. sour cream. Ice cream. Beverages Caffeinated beverages (such as coffee, tea, soda, or energy drinks). Alcoholic beverages. Fruit juices with pulp. Prune juice. Soft drinks sweetened with high-fructose corn syrup or sugar alcohols. High-calorie sports drinks. Fats and oils Butter. Cream sauces. Margarine. Salad oils. Plain salad dressings. Olives. Avocados. Mayonnaise. Sweets and desserts Sweet rolls, doughnuts, and sweet breads. Sugar-free desserts sweetened with sugar alcohols such as xylitol and sorbitol. Seasoning and other foods Honey. Hot sauce. Chili powder. Gravy. Cream-based or milk-based soups. Pancakes and waffles. Summary  When you have diarrhea, the foods you eat and your eating habits are very important.  Make sure you get at least 8-10 cups of fluid each day, or enough to keep your urine clear or pale yellow.  Eat bland foods and gradually reintroduce healthy, nutrient-rich foods as tolerated, or as told by your health care provider.  Avoid high-fiber, fried, greasy, or spicy foods. This information is not intended to replace advice given to you by your health care provider. Make sure you discuss any questions you have with your health care provider. Document Released: 05/24/2003 Document Revised: 02/29/2016 Document Reviewed:  02/29/2016 Elsevier Interactive Patient Education  2018 [employer] Diet A bland diet consists of foods that do not have a lot of fat or fiber. Foods without fat or fiber are easier for the body to digest. They are also less likely to irritate your mouth, throat, stomach, and other parts of your gastrointestinal tract. A bland diet is sometimes called a BRAT diet. What is my plan? Your health care provider or dietitian may recommend specific changes to your diet to prevent and treat your symptoms, such as:  Eating small meals often.  Cooking food until it is soft enough to chew easily.  Chewing your food well.  Drinking fluids slowly.  Not eating foods that are very spicy, sour, or fatty.  Not eating citrus fruits, such as oranges and grapefruit.  What do I need to know about this diet?  Eat a variety of foods from the bland diet food list.  Do not follow a bland diet longer than you have to.  Ask your health care provider whether you should take vitamins. What foods can I eat? Grains  Hot cereals, such as cream of wheat. Bread, crackers, or tortillas made from refined white flour. Rice. Vegetables Canned or cooked vegetables. Mashed or boiled potatoes. Fruits Bananas. Applesauce. Other types of cooked or canned fruit with the skin and seeds removed, such as canned peaches or pears. Meats and Other Protein Sources Scrambled eggs. Creamy peanut butter or other nut butters. Lean, well-cooked meats, such as chicken or fish. Tofu. Soups or broths. Dairy Low-fat dairy products, such as milk, cottage cheese, or yogurt. Beverages Water. Herbal tea. Apple juice. Sweets and Desserts Pudding. Custard. Fruit gelatin. Ice cream. Fats  and Oils Mild salad dressings. Canola or olive oil. The items listed above may not be a complete list of allowed foods or beverages. Contact your dietitian for more options. What foods are not recommended? Foods and ingredients that are often  not recommended include:  Spicy foods, such as hot sauce or salsa.  Fried foods.  Sour foods, such as pickled or fermented foods.  Raw vegetables or fruits, especially citrus or berries.  Caffeinated drinks.  Alcohol.  Strongly flavored seasonings or condiments.  The items listed above may not be a complete list of foods and beverages that are not allowed. Contact your dietitian for more information. This information is not intended to replace advice given to you by your health care provider. Make sure you discuss any questions you have with your health care provider. Document Released: 06/25/2015 Document Revised: 08/09/2015 Document Reviewed: 03/15/2014 Elsevier Interactive Patient Education  2018 [employer].

## 2017-11-25 NOTE — Progress Notes (Signed)
  Subjective:     Patient ID: Peter Snyder, male   DOB: September 09, 1955, 62 y.o.   MRN: [id number]  HPI Patient seen for the following issues  One-day history of some GI upset. He's had couple of loose stools today to watery stools but nonbloody. He took a Librax which he thinks may have helped slightly. Mild abdominal cramping. No nausea or vomiting. No recent travels. No recent antibiotics. Keeping down fluids well. He did eat a salad for lunch.  Patient also complains of rash left anterior thigh. This is actually more of a pustular lesion. Non-tender. No fevers or chills. No known history of MRSA.  Past Medical History:  Diagnosis Date  . Arthritis   . CAD (coronary artery disease) 6/14   Cardiac catheterization 6/27/ 2014 ejection fraction 35-40%, 30% proximal left circumflex, 100% tiny obtuse marginal 1 with collaterals, 50% LAD, 50% D1, 100% RCA with collaterals  . Chicken pox   . Colon polyps   . Depression   . Diabetes mellitus ([place])    Diet controlled  . GERD (gastroesophageal reflux disease)   . Hyperlipidemia   . Hypertension   . Kidney stone    Past Surgical History:  Procedure Laterality Date  . TONSILLECTOMY  1963    reports that he quit smoking about 15 years ago. He has never used smokeless tobacco. He reports that he does not drink alcohol or use drugs. family history includes Arthritis in his father and paternal grandmother; Clotting disorder in his paternal grandfather and paternal grandmother; Colon polyps in his father; Heart disease in his maternal grandmother; Hyperlipidemia in his maternal grandmother; Liver cancer in his paternal grandfather; Melanoma in his mother. Allergies  Allergen Reactions  . Sulfa Antibiotics Rash     Review of Systems  Constitutional: Negative for chills and fever.  Respiratory: Negative for cough and shortness of breath.   Cardiovascular: Negative for chest pain.  Gastrointestinal: Positive for diarrhea. Negative for blood in  stool, nausea and vomiting.  Skin: Positive for rash.  Neurological: Negative for dizziness.       Objective:   Physical Exam  Constitutional: He appears well-developed and well-nourished.  Cardiovascular: Normal rate and regular rhythm.  Pulmonary/Chest: Effort normal and breath sounds normal.  Abdominal: Soft. Bowel sounds are normal. He exhibits no distension and no mass. There is no tenderness. There is no rebound and no guarding. No hernia.  Skin:  Patient has very small pustule left upper anterior thigh. This is only about 1 mm diameter. Erythematous base. Non-tender.       Assessment:     #1 diarrhea of one day duration. Probably viral. Clinically, not dehydrated. Unremarkable exam  #2 very small pustule left anterior thigh-suspect this will be self-limited.  No indication for antibiotic at this time.      Plan:     -handout given on appropriate diet for diarrhea. Recommend bland diet over the next few days. Stay well-hydrated. -Follow-up promptly for any fever, bloody stools, or other changes -Follow-up for any progressive erythema or swelling of left anterior thigh. He has fairly small pustule which we explained are usually self resolving.  [name] MD [facility] at [facility]

## 2017-11-26 DIAGNOSIS — M25511 Pain in right shoulder: Secondary | ICD-10-CM | POA: Diagnosis not present

## 2017-11-26 DIAGNOSIS — M5136 Other intervertebral disc degeneration, lumbar region: Secondary | ICD-10-CM | POA: Diagnosis not present

## 2017-11-26 DIAGNOSIS — M50923 Unspecified cervical disc disorder at C6-C7 level: Secondary | ICD-10-CM | POA: Diagnosis not present

## 2017-11-26 DIAGNOSIS — M50922 Unspecified cervical disc disorder at C5-C6 level: Secondary | ICD-10-CM | POA: Diagnosis not present

## 2017-11-30 DIAGNOSIS — M50923 Unspecified cervical disc disorder at C6-C7 level: Secondary | ICD-10-CM | POA: Diagnosis not present

## 2017-11-30 DIAGNOSIS — M25511 Pain in right shoulder: Secondary | ICD-10-CM | POA: Diagnosis not present

## 2017-11-30 DIAGNOSIS — M50922 Unspecified cervical disc disorder at C5-C6 level: Secondary | ICD-10-CM | POA: Diagnosis not present

## 2017-11-30 DIAGNOSIS — M5136 Other intervertebral disc degeneration, lumbar region: Secondary | ICD-10-CM | POA: Diagnosis not present

## 2017-12-01 DIAGNOSIS — S0501XA Injury of conjunctiva and corneal abrasion without foreign body, right eye, initial encounter: Secondary | ICD-10-CM | POA: Diagnosis not present

## 2017-12-02 DIAGNOSIS — M50922 Unspecified cervical disc disorder at C5-C6 level: Secondary | ICD-10-CM | POA: Diagnosis not present

## 2017-12-02 DIAGNOSIS — M5136 Other intervertebral disc degeneration, lumbar region: Secondary | ICD-10-CM | POA: Diagnosis not present

## 2017-12-02 DIAGNOSIS — M25511 Pain in right shoulder: Secondary | ICD-10-CM | POA: Diagnosis not present

## 2017-12-02 DIAGNOSIS — M50923 Unspecified cervical disc disorder at C6-C7 level: Secondary | ICD-10-CM | POA: Diagnosis not present

## 2017-12-03 DIAGNOSIS — M47816 Spondylosis without myelopathy or radiculopathy, lumbar region: Secondary | ICD-10-CM | POA: Diagnosis not present

## 2017-12-10 DIAGNOSIS — M25511 Pain in right shoulder: Secondary | ICD-10-CM | POA: Diagnosis not present

## 2017-12-10 DIAGNOSIS — M5136 Other intervertebral disc degeneration, lumbar region: Secondary | ICD-10-CM | POA: Diagnosis not present

## 2017-12-10 DIAGNOSIS — M50922 Unspecified cervical disc disorder at C5-C6 level: Secondary | ICD-10-CM | POA: Diagnosis not present

## 2017-12-10 DIAGNOSIS — M50923 Unspecified cervical disc disorder at C6-C7 level: Secondary | ICD-10-CM | POA: Diagnosis not present

## 2017-12-14 ENCOUNTER — Other Ambulatory Visit: Payer: Self-pay

## 2017-12-14 ENCOUNTER — Ambulatory Visit (INDEPENDENT_AMBULATORY_CARE_PROVIDER_SITE_OTHER): Payer: BLUE CROSS/BLUE SHIELD | Admitting: Family Medicine

## 2017-12-14 ENCOUNTER — Encounter

## 2017-12-14 ENCOUNTER — Encounter: Payer: Self-pay | Admitting: Family Medicine

## 2017-12-14 VITALS — BP 102/62 | HR 73 | Temp 97.8°F | Ht 74.0 in | Wt 220.4 lb

## 2017-12-14 DIAGNOSIS — M5136 Other intervertebral disc degeneration, lumbar region: Secondary | ICD-10-CM | POA: Diagnosis not present

## 2017-12-14 DIAGNOSIS — M50923 Unspecified cervical disc disorder at C6-C7 level: Secondary | ICD-10-CM | POA: Diagnosis not present

## 2017-12-14 DIAGNOSIS — M25511 Pain in right shoulder: Secondary | ICD-10-CM | POA: Diagnosis not present

## 2017-12-14 DIAGNOSIS — K219 Gastro-esophageal reflux disease without esophagitis: Secondary | ICD-10-CM

## 2017-12-14 DIAGNOSIS — M50922 Unspecified cervical disc disorder at C5-C6 level: Secondary | ICD-10-CM | POA: Diagnosis not present

## 2017-12-14 NOTE — Patient Instructions (Signed)
Increase the Protonix to one twice daily for a few days and then decrease back to one daily  May supplement with Tagamet and Gaviscon.

## 2017-12-14 NOTE — Progress Notes (Signed)
  Subjective:     Patient ID: Peter Snyder, male   DOB: 04/08/1955, 62 y.o.   MRN: [id number]  HPI   Patient is seen with some breakthrough heartburn symptoms this past Thursday.  He had some burning mid epigastric area radiating up substernally. He takes Protonix 40 mg daily.  He and his wife were in [place] visiting family last week and he thinks he had perhaps more coffee and also ate several things outside of his usual diet.  Denies any dysphagia.  No exertional symptoms.  Past Medical History:  Diagnosis Date  . Arthritis   . CAD (coronary artery disease) 6/14   Cardiac catheterization 6/27/ 2014 ejection fraction 35-40%, 30% proximal left circumflex, 100% tiny obtuse marginal 1 with collaterals, 50% LAD, 50% D1, 100% RCA with collaterals  . Chicken pox   . Colon polyps   . Depression   . Diabetes mellitus ([place])    Diet controlled  . GERD (gastroesophageal reflux disease)   . Hyperlipidemia   . Hypertension   . Kidney stone    Past Surgical History:  Procedure Laterality Date  . TONSILLECTOMY  1963    reports that he quit smoking about 15 years ago. He has never used smokeless tobacco. He reports that he does not drink alcohol or use drugs. family history includes Arthritis in his father and paternal grandmother; Clotting disorder in his paternal grandfather and paternal grandmother; Colon polyps in his father; Heart disease in his maternal grandmother; Hyperlipidemia in his maternal grandmother; Liver cancer in his paternal grandfather; Melanoma in his mother. Allergies  Allergen Reactions  . Latex Other (See Comments)    Other  . Sulfa Antibiotics Rash    Review of Systems  Respiratory: Negative for shortness of breath.   Gastrointestinal: Negative for abdominal distention, blood in stool, nausea and vomiting.       Objective:   Physical Exam  Constitutional: He appears well-developed and well-nourished.  Cardiovascular: Normal rate and regular rhythm.   Pulmonary/Chest: Effort normal and breath sounds normal.  Abdominal: Soft. Bowel sounds are normal. There is no tenderness.       Assessment:     GERD.  Patient has had some recent breakthrough symptoms on Protonix    Plan:     -He will consider doubling up Protonix for the next few days and also can supplement with Tagamet as needed -Gradually reduce caffeine intake -We discussed other dietary factors that could exacerbate -touch base if symptoms not improving by next week.  [name] MD [facility] at [facility]

## 2017-12-16 DIAGNOSIS — S0501XA Injury of conjunctiva and corneal abrasion without foreign body, right eye, initial encounter: Secondary | ICD-10-CM | POA: Diagnosis not present

## 2017-12-16 DIAGNOSIS — F411 Generalized anxiety disorder: Secondary | ICD-10-CM | POA: Diagnosis not present

## 2017-12-17 ENCOUNTER — Encounter: Payer: Self-pay | Admitting: Family Medicine

## 2017-12-17 ENCOUNTER — Ambulatory Visit (INDEPENDENT_AMBULATORY_CARE_PROVIDER_SITE_OTHER): Payer: BLUE CROSS/BLUE SHIELD | Admitting: Family Medicine

## 2017-12-17 VITALS — BP 94/60 | HR 65 | Temp 97.8°F | Wt 216.4 lb

## 2017-12-17 DIAGNOSIS — B349 Viral infection, unspecified: Secondary | ICD-10-CM

## 2017-12-17 MED ORDER — BENZONATATE 200 MG PO CAPS: 200.0000 mg | ORAL_CAPSULE | Freq: Two times a day (BID) | ORAL | 0 refills | Status: DC | PRN

## 2017-12-17 NOTE — Progress Notes (Signed)
   Subjective:    Patient ID: Peter Snyder, male    DOB: 03-22-1955, 62 y.o.   MRN: [id number]  HPI Here for 2 days of fever to 99.8 degrees, body aches, stuffy head, a dry cough, and some diarrhea. Drinking fluids.    Review of Systems  Constitutional: Positive for fever.  HENT: Positive for congestion, postnasal drip and sinus pressure. Negative for sinus pain and sore throat.   Eyes: Negative.   Respiratory: Positive for cough.   Cardiovascular: Negative.   Gastrointestinal: Positive for diarrhea and nausea. Negative for abdominal distention, abdominal pain, constipation and vomiting.  Genitourinary: Negative.   Neurological: Negative.        Objective:   Physical Exam  Constitutional: He appears well-developed and well-nourished.  HENT:  Right Ear: External ear normal.  Left Ear: External ear normal.  Nose: Nose normal.  Mouth/Throat: Oropharynx is clear and moist.  Eyes: Conjunctivae are normal.  Neck: No thyromegaly present.  Pulmonary/Chest: Effort normal and breath sounds normal. No stridor. No respiratory distress. He has no wheezes. He has no rales.  Abdominal: Soft. Bowel sounds are normal. He exhibits no distension and no mass. There is no tenderness. There is no rebound and no guarding.  Lymphadenopathy:    He has no cervical adenopathy.          Assessment & Plan:  Viral illness. Rest and drink fluids. Use Benzonatate for cough. Use Imodium prn.  Alysia Penna, MD

## 2017-12-18 ENCOUNTER — Ambulatory Visit: Payer: BLUE CROSS/BLUE SHIELD | Admitting: Family Medicine

## 2017-12-20 ENCOUNTER — Other Ambulatory Visit: Payer: Self-pay | Admitting: Family Medicine

## 2017-12-21 ENCOUNTER — Encounter: Payer: Self-pay | Admitting: Family Medicine

## 2017-12-21 ENCOUNTER — Ambulatory Visit (INDEPENDENT_AMBULATORY_CARE_PROVIDER_SITE_OTHER): Payer: BLUE CROSS/BLUE SHIELD | Admitting: Family Medicine

## 2017-12-21 ENCOUNTER — Other Ambulatory Visit: Payer: Self-pay

## 2017-12-21 VITALS — BP 110/62 | HR 75 | Temp 98.7°F | Ht 74.0 in | Wt 215.4 lb

## 2017-12-21 DIAGNOSIS — J019 Acute sinusitis, unspecified: Secondary | ICD-10-CM

## 2017-12-21 DIAGNOSIS — H15109 Unspecified episcleritis, unspecified eye: Secondary | ICD-10-CM | POA: Diagnosis not present

## 2017-12-21 MED ORDER — DOXYCYCLINE HYCLATE 100 MG PO CAPS: 100.0000 mg | ORAL_CAPSULE | Freq: Two times a day (BID) | ORAL | 0 refills | Status: DC

## 2017-12-21 NOTE — Patient Instructions (Addendum)
Touch bas e by next week if not better.

## 2017-12-21 NOTE — Progress Notes (Signed)
  Subjective:     Patient ID: Peter Snyder, male   DOB: [birth year], 62 y.o.   MRN: [id number]  HPI Patient is seen with progressive respiratory symptoms.  He was seen last Thursday with presumed viral illness.  He has some cough and nasal congestion.  He now has had some increased facial pressure and worsening cough.  He has had some yellowish and bloody nasal discharge.  Increased malaise.  No fever.  Past Medical History:  Diagnosis Date  . Arthritis   . CAD (coronary artery disease) 6/14   Cardiac catheterization 6/27/ 2014 ejection fraction 35-40%, 30% proximal left circumflex, 100% tiny obtuse marginal 1 with collaterals, 50% LAD, 50% D1, 100% RCA with collaterals  . Chicken pox   . Colon polyps   . Depression   . Diabetes mellitus ([place])    Diet controlled  . GERD (gastroesophageal reflux disease)   . Hyperlipidemia   . Hypertension   . Kidney stone    Past Surgical History:  Procedure Laterality Date  . TONSILLECTOMY  1963    reports that he quit smoking about 15 years ago. He has never used smokeless tobacco. He reports that he does not drink alcohol or use drugs. family history includes Arthritis in his father and paternal grandmother; Clotting disorder in his paternal grandfather and paternal grandmother; Colon polyps in his father; Heart disease in his maternal grandmother; Hyperlipidemia in his maternal grandmother; Liver cancer in his paternal grandfather; Melanoma in his mother. Allergies  Allergen Reactions  . Latex Other (See Comments)    Other  . Sulfa Antibiotics Rash     Review of Systems  Constitutional: Positive for fatigue. Negative for chills and fever.  HENT: Positive for congestion, sinus pressure and sinus pain.   Respiratory: Positive for cough. Negative for shortness of breath and wheezing.   Cardiovascular: Negative for chest pain.       Objective:   Physical Exam  Constitutional: He appears well-developed and well-nourished.  HENT:  Right  Ear: External ear normal.  Left Ear: External ear normal.  Mouth/Throat: Oropharynx is clear and moist.  Neck: Neck supple.  Cardiovascular: Normal rate and regular rhythm.  Pulmonary/Chest: Effort normal and breath sounds normal.  Lymphadenopathy:    He has no cervical adenopathy.       Assessment:     Acute sinusitis    Plan:     -Start doxycycline 100 mg twice daily with food -Stay well-hydrated -Follow-up for any fever or any persistent or worsening symptoms  [name] MD [facility] at [facility]

## 2017-12-22 DIAGNOSIS — Z713 Dietary counseling and surveillance: Secondary | ICD-10-CM | POA: Diagnosis not present

## 2017-12-23 ENCOUNTER — Encounter: Payer: Self-pay | Admitting: Family Medicine

## 2017-12-23 DIAGNOSIS — M25511 Pain in right shoulder: Secondary | ICD-10-CM | POA: Diagnosis not present

## 2017-12-23 DIAGNOSIS — M5136 Other intervertebral disc degeneration, lumbar region: Secondary | ICD-10-CM | POA: Diagnosis not present

## 2017-12-23 DIAGNOSIS — M50922 Unspecified cervical disc disorder at C5-C6 level: Secondary | ICD-10-CM | POA: Diagnosis not present

## 2017-12-23 DIAGNOSIS — M50923 Unspecified cervical disc disorder at C6-C7 level: Secondary | ICD-10-CM | POA: Diagnosis not present

## 2017-12-25 ENCOUNTER — Encounter: Payer: Self-pay | Admitting: Family Medicine

## 2017-12-27 ENCOUNTER — Other Ambulatory Visit: Payer: Self-pay | Admitting: Cardiology

## 2017-12-27 DIAGNOSIS — I255 Ischemic cardiomyopathy: Secondary | ICD-10-CM

## 2017-12-28 DIAGNOSIS — M5136 Other intervertebral disc degeneration, lumbar region: Secondary | ICD-10-CM | POA: Diagnosis not present

## 2017-12-28 DIAGNOSIS — M7751 Other enthesopathy of right foot: Secondary | ICD-10-CM | POA: Diagnosis not present

## 2017-12-28 DIAGNOSIS — M50923 Unspecified cervical disc disorder at C6-C7 level: Secondary | ICD-10-CM | POA: Diagnosis not present

## 2017-12-28 DIAGNOSIS — M50922 Unspecified cervical disc disorder at C5-C6 level: Secondary | ICD-10-CM | POA: Diagnosis not present

## 2017-12-28 DIAGNOSIS — G5761 Lesion of plantar nerve, right lower limb: Secondary | ICD-10-CM | POA: Diagnosis not present

## 2017-12-28 DIAGNOSIS — M25511 Pain in right shoulder: Secondary | ICD-10-CM | POA: Diagnosis not present

## 2017-12-29 DIAGNOSIS — M47816 Spondylosis without myelopathy or radiculopathy, lumbar region: Secondary | ICD-10-CM | POA: Diagnosis not present

## 2018-01-01 ENCOUNTER — Encounter: Payer: Self-pay | Admitting: Adult Health

## 2018-01-01 ENCOUNTER — Ambulatory Visit (INDEPENDENT_AMBULATORY_CARE_PROVIDER_SITE_OTHER): Payer: BLUE CROSS/BLUE SHIELD | Admitting: Adult Health

## 2018-01-01 VITALS — BP 134/70 | Temp 97.9°F | Wt 215.0 lb

## 2018-01-01 DIAGNOSIS — R0981 Nasal congestion: Secondary | ICD-10-CM | POA: Diagnosis not present

## 2018-01-01 NOTE — Progress Notes (Signed)
Subjective:    Patient ID: Peter Snyder, male    DOB: Aug 05, 1955, 62 y.o.   MRN: [id number]  HPI 62 year old male  who  has a past medical history of Arthritis, CAD (coronary artery disease) (6/14), Chicken pox, Colon polyps, Depression, Diabetes mellitus ([place]), GERD (gastroesophageal reflux disease), Hyperlipidemia, Hypertension, and Kidney stone.  He is a patient of Dr. Elease Hashimoto who I am seeing today for an acute issue. He complains of nasal congestion and sore throat. Symptoms have been present for 2 days. Finished course of Doxycycline one day prior to symptoms starting.   Reports clear rhinorrhea.   Denies fevers, headaches, n/v/d   He has been using Flonase and Astelin nasal spray   Review of Systems See HPI   Past Medical History:  Diagnosis Date  . Arthritis   . CAD (coronary artery disease) 6/14   Cardiac catheterization 6/27/ 2014 ejection fraction 35-40%, 30% proximal left circumflex, 100% tiny obtuse marginal 1 with collaterals, 50% LAD, 50% D1, 100% RCA with collaterals  . Chicken pox   . Colon polyps   . Depression   . Diabetes mellitus ([place])    Diet controlled  . GERD (gastroesophageal reflux disease)   . Hyperlipidemia   . Hypertension   . Kidney stone     Social History   Socioeconomic History  . Marital status: Married    Spouse name: Not on file  . Number of children: 3  . Years of education: Not on file  . Highest education level: Not on file  Occupational History  . Occupation: retired    Comment: Retired  Scientific laboratory technician  . Financial resource strain: Not on file  . Food insecurity:    Worry: Not on file    Inability: Not on file  . Transportation needs:    Medical: Not on file    Non-medical: Not on file  Tobacco Use  . Smoking status: Former Smoker    Last attempt to quit: 09/15/2002    Years since quitting: 15.3  . Smokeless tobacco: Never Used  . Tobacco comment: quit 15 years ago -socially  Substance and Sexual Activity  . Alcohol  use: No    Alcohol/week: 0.0 standard drinks  . Drug use: No  . Sexual activity: Not on file  Lifestyle  . Physical activity:    Days per week: Not on file    Minutes per session: Not on file  . Stress: Not on file  Relationships  . Social connections:    Talks on phone: Not on file    Gets together: Not on file    Attends religious service: Not on file    Active member of club or organization: Not on file    Attends meetings of clubs or organizations: Not on file    Relationship status: Not on file  . Intimate partner violence:    Fear of current or ex partner: Not on file    Emotionally abused: Not on file    Physically abused: Not on file    Forced sexual activity: Not on file  Other Topics Concern  . Not on file  Social History Narrative  . Not on file    Past Surgical History:  Procedure Laterality Date  . TONSILLECTOMY  1963    Family History  Problem Relation Age of Onset  . Melanoma Mother        metastatic  . Arthritis Father   . Colon polyps Father   . Hyperlipidemia Maternal  Grandmother   . Heart disease Maternal Grandmother   . Arthritis Paternal Grandmother   . Clotting disorder Paternal Grandmother   . Clotting disorder Paternal Grandfather   . Liver cancer Paternal Grandfather   . Colon cancer Neg Hx   . Esophageal cancer Neg Hx   . Rectal cancer Neg Hx     Allergies  Allergen Reactions  . Latex Other (See Comments)    Other  . Sulfa Antibiotics Rash    Current Outpatient Medications on File Prior to Visit  Medication Sig Dispense Refill  . acetaminophen (TYLENOL) 500 MG tablet Take 500 mg by mouth every 6 (six) hours as needed for moderate pain.    Marland Kitchen ALPRAZolam (XANAX) 0.25 MG tablet Take 0.25 mg by mouth daily.     Marland Kitchen aspirin 81 MG tablet Take 81 mg by mouth daily.    Marland Kitchen atorvastatin (LIPITOR) 80 MG tablet TAKE 1 TABLET BY MOUTH EVERY DAY 90 tablet 1  . azelastine (ASTELIN) 0.1 % nasal spray Place 1 spray into both nostrils 2 (two) times  daily. Use in each nostril as directed 30 mL 12  . benzonatate (TESSALON) 200 MG capsule Take 1 capsule (200 mg total) by mouth 2 (two) times daily as needed for cough. 60 capsule 0  . Bismuth Tribromoph-Petrolatum (XEROFORM PETROLAT PATCH 4"X4") PADS USE AS DIRECTED TWICE A DAY EXTERNALLY 30 DAYS  4  . carvedilol (COREG) 12.5 MG tablet TAKE 1 TABLET (12.5 MG TOTAL) BY MOUTH 2 (TWO) TIMES DAILY WITH A MEAL. 180 tablet 2  . celecoxib (CELEBREX) 200 MG capsule Take 200 mg by mouth daily.     . clonazePAM (KLONOPIN) 0.5 MG tablet Take 0.5 mg by mouth at bedtime.     . Dermatological Products, Misc. (LEVICYN) GEL Apply 1 application topically 2 (two) times daily after a meal.  2  . Dermatological Products, Misc. (TETRIX) CREA Apply topically See admin instructions.  3  . dexamethasone 0.5 MG/5ML elixir 2-3 tsps swish and spit QID prn. 237 mL 2  . ELIDEL 1 % cream APPLY TO AFFECTED AREA TWICE A DAY AS NEEDED FOR 14 DAYS  2  . escitalopram (LEXAPRO) 20 MG tablet Take 20 mg by mouth daily.    Marland Kitchen ezetimibe (ZETIA) 10 MG tablet TAKE 1 TABLET (10 MG TOTAL) BY MOUTH DAILY. 90 tablet 1  . fluticasone (FLONASE) 50 MCG/ACT nasal spray INHALE 2 SPRAYS IN EACH NOSTRIL EVERY DAY 48 g 2  . gabapentin (NEURONTIN) 600 MG tablet TAKE 1 TABLET BY MOUTH TWICE A DAY 180 tablet 1  . HYDROCORTISONE ACE, RECTAL, 30 MG SUPP Place 1 suppository (30 mg total) rectally 2 (two) times daily as needed. 24 each 0  . lisinopril (PRINIVIL,ZESTRIL) 20 MG tablet Take 20 mg by mouth daily.    Marland Kitchen lisinopril (PRINIVIL,ZESTRIL) 20 MG tablet TAKE 1 TABLET BY MOUTH EVERY DAY 90 tablet 1  . LOTEMAX 0.5 % GEL     . pantoprazole (PROTONIX) 40 MG tablet TAKE 1 TABLET (40 MG TOTAL) BY MOUTH DAILY. 90 tablet 3  . RESTASIS 0.05 % ophthalmic emulsion Place 1 drop into both eyes 2 (two) times daily.     . tacrolimus (PROTOPIC) 0.1 % ointment     . tamsulosin (FLOMAX) 0.4 MG CAPS capsule TAKE 1 CAPSULE (0.4 MG TOTAL) BY MOUTH DAILY AFTER SUPPER. 90  capsule 1  . triamcinolone cream (KENALOG) 0.1 % Apply 1 application topically 2 (two) times daily as needed. 30 g 1  . urea (CARMOL) 10 %  cream APPLY TO AFFECTED AREA TWICE A DAY AS NEEDED  3  . Urea 40 % LOTN Apply 1 application topically 2 (two) times a week.  3  . VOLTAREN 1 % GEL Apply 2 g topically daily.     Marland Kitchen zolpidem (AMBIEN) 10 MG tablet Take 10 mg by mouth at bedtime as needed for sleep.     No current facility-administered medications on file prior to visit.     BP 134/70   Temp 97.9 F (36.6 C) (Oral)   Wt 215 lb (97.5 kg)   BMI 27.60 kg/m       Objective:   Physical Exam  Constitutional: He appears well-developed and well-nourished. No distress.  HENT:  Right Ear: Hearing, tympanic membrane, external ear and ear canal normal.  Left Ear: Hearing, tympanic membrane, external ear and ear canal normal.  Nose: Rhinorrhea (clear) present. No mucosal edema. Right sinus exhibits no maxillary sinus tenderness and no frontal sinus tenderness. Left sinus exhibits no maxillary sinus tenderness and no frontal sinus tenderness.  Mouth/Throat: Uvula is midline and mucous membranes are normal.  + clear PND  Eyes: Pupils are equal, round, and reactive to light. Conjunctivae are normal.  Cardiovascular: Normal rate, regular rhythm, normal heart sounds and intact distal pulses.  Pulmonary/Chest: Effort normal and breath sounds normal.  Skin: He is not diaphoretic.  Nursing note and vitals reviewed.     Assessment & Plan:  1. Nasal congestion - Likely seasonal allergy or viral. No need for additional abx at this time  - Advised to try normal saline nasal spray   Dorothyann Peng, NP

## 2018-01-02 ENCOUNTER — Other Ambulatory Visit: Payer: Self-pay | Admitting: Family Medicine

## 2018-01-06 DIAGNOSIS — M50922 Unspecified cervical disc disorder at C5-C6 level: Secondary | ICD-10-CM | POA: Diagnosis not present

## 2018-01-06 DIAGNOSIS — M5136 Other intervertebral disc degeneration, lumbar region: Secondary | ICD-10-CM | POA: Diagnosis not present

## 2018-01-06 DIAGNOSIS — Z23 Encounter for immunization: Secondary | ICD-10-CM | POA: Diagnosis not present

## 2018-01-06 DIAGNOSIS — L570 Actinic keratosis: Secondary | ICD-10-CM | POA: Diagnosis not present

## 2018-01-06 DIAGNOSIS — M25511 Pain in right shoulder: Secondary | ICD-10-CM | POA: Diagnosis not present

## 2018-01-06 DIAGNOSIS — L821 Other seborrheic keratosis: Secondary | ICD-10-CM | POA: Diagnosis not present

## 2018-01-06 DIAGNOSIS — L700 Acne vulgaris: Secondary | ICD-10-CM | POA: Diagnosis not present

## 2018-01-06 DIAGNOSIS — M50923 Unspecified cervical disc disorder at C6-C7 level: Secondary | ICD-10-CM | POA: Diagnosis not present

## 2018-01-07 ENCOUNTER — Ambulatory Visit (INDEPENDENT_AMBULATORY_CARE_PROVIDER_SITE_OTHER): Payer: BLUE CROSS/BLUE SHIELD | Admitting: Family Medicine

## 2018-01-07 ENCOUNTER — Encounter: Payer: Self-pay | Admitting: Family Medicine

## 2018-01-07 VITALS — BP 110/78 | HR 68 | Temp 98.2°F | Ht 74.0 in | Wt 212.5 lb

## 2018-01-07 DIAGNOSIS — R221 Localized swelling, mass and lump, neck: Secondary | ICD-10-CM | POA: Diagnosis not present

## 2018-01-07 NOTE — Progress Notes (Signed)
HPI:  Using dictation device. Unfortunately this device frequently misinterprets words/phrases.  Acute visit for ? Neck swelling: -his wife noticed bilateral "bump" in lower neck today -he does not feel this looks different from usual -sees Dr. Nelva Bush and PT for neck and back issues and had bilat needling traps yesterday -denies pain in this area, SOB, difficulty turning neck, dysphagia, sore throat, redness, fevers, malaise the does not thinks looks any different, but wanted to check it out  ROS: See pertinent positives and negatives per HPI.  Past Medical History:  Diagnosis Date  . Arthritis   . CAD (coronary artery disease) 6/14   Cardiac catheterization 6/27/ 2014 ejection fraction 35-40%, 30% proximal left circumflex, 100% tiny obtuse marginal 1 with collaterals, 50% LAD, 50% D1, 100% RCA with collaterals  . Chicken pox   . Colon polyps   . Depression   . Diabetes mellitus ([place])    Diet controlled  . GERD (gastroesophageal reflux disease)   . Hyperlipidemia   . Hypertension   . Kidney stone     Past Surgical History:  Procedure Laterality Date  . TONSILLECTOMY  1963    Family History  Problem Relation Age of Onset  . Melanoma Mother        metastatic  . Arthritis Father   . Colon polyps Father   . Hyperlipidemia Maternal Grandmother   . Heart disease Maternal Grandmother   . Arthritis Paternal Grandmother   . Clotting disorder Paternal Grandmother   . Clotting disorder Paternal Grandfather   . Liver cancer Paternal Grandfather   . Colon cancer Neg Hx   . Esophageal cancer Neg Hx   . Rectal cancer Neg Hx     SOCIAL HX: see hpi   Current Outpatient Medications:  .  acetaminophen (TYLENOL) 500 MG tablet, Take 500 mg by mouth every 6 (six) hours as needed for moderate pain., Disp: , Rfl:  .  ALPRAZolam (XANAX) 0.25 MG tablet, Take 0.25 mg by mouth daily. , Disp: , Rfl:  .  aspirin 81 MG tablet, Take 81 mg by mouth daily., Disp: , Rfl:  .  atorvastatin  (LIPITOR) 80 MG tablet, TAKE 1 TABLET BY MOUTH EVERY DAY, Disp: 90 tablet, Rfl: 1 .  azelastine (ASTELIN) 0.1 % nasal spray, Place 1 spray into both nostrils 2 (two) times daily. Use in each nostril as directed, Disp: 30 mL, Rfl: 12 .  benzonatate (TESSALON) 200 MG capsule, Take 1 capsule (200 mg total) by mouth 2 (two) times daily as needed for cough., Disp: 60 capsule, Rfl: 0 .  Bismuth Tribromoph-Petrolatum (XEROFORM PETROLAT PATCH 4"X4") PADS, USE AS DIRECTED TWICE A DAY EXTERNALLY 30 DAYS, Disp: , Rfl: 4 .  carvedilol (COREG) 12.5 MG tablet, TAKE 1 TABLET (12.5 MG TOTAL) BY MOUTH 2 (TWO) TIMES DAILY WITH A MEAL., Disp: 180 tablet, Rfl: 2 .  celecoxib (CELEBREX) 200 MG capsule, Take 200 mg by mouth daily. , Disp: , Rfl:  .  clonazePAM (KLONOPIN) 0.5 MG tablet, Take 0.5 mg by mouth at bedtime. , Disp: , Rfl:  .  Dermatological Products, Misc. (LEVICYN) GEL, Apply 1 application topically 2 (two) times daily after a meal., Disp: , Rfl: 2 .  Dermatological Products, Misc. (TETRIX) CREA, Apply topically See admin instructions., Disp: , Rfl: 3 .  dexamethasone 0.5 MG/5ML elixir, 2-3 tsps swish and spit QID prn., Disp: 237 mL, Rfl: 2 .  ELIDEL 1 % cream, APPLY TO AFFECTED AREA TWICE A DAY AS NEEDED FOR 14 DAYS, Disp: ,  Rfl: 2 .  escitalopram (LEXAPRO) 20 MG tablet, Take 20 mg by mouth daily., Disp: , Rfl:  .  ezetimibe (ZETIA) 10 MG tablet, TAKE 1 TABLET (10 MG TOTAL) BY MOUTH DAILY., Disp: 90 tablet, Rfl: 1 .  fluticasone (FLONASE) 50 MCG/ACT nasal spray, INHALE 2 SPRAYS IN EACH NOSTRIL EVERY DAY, Disp: 48 g, Rfl: 2 .  gabapentin (NEURONTIN) 600 MG tablet, TAKE 1 TABLET BY MOUTH TWICE A DAY, Disp: 180 tablet, Rfl: 1 .  HYDROCORTISONE ACE, RECTAL, 30 MG SUPP, Place 1 suppository (30 mg total) rectally 2 (two) times daily as needed., Disp: 24 each, Rfl: 0 .  lisinopril (PRINIVIL,ZESTRIL) 20 MG tablet, Take 20 mg by mouth daily., Disp: , Rfl:  .  lisinopril (PRINIVIL,ZESTRIL) 20 MG tablet, TAKE 1 TABLET  BY MOUTH EVERY DAY, Disp: 90 tablet, Rfl: 1 .  LOTEMAX 0.5 % GEL, , Disp: , Rfl:  .  pantoprazole (PROTONIX) 40 MG tablet, TAKE 1 TABLET (40 MG TOTAL) BY MOUTH DAILY., Disp: 90 tablet, Rfl: 3 .  RESTASIS 0.05 % ophthalmic emulsion, Place 1 drop into both eyes 2 (two) times daily. , Disp: , Rfl:  .  tacrolimus (PROTOPIC) 0.1 % ointment, , Disp: , Rfl:  .  tamsulosin (FLOMAX) 0.4 MG CAPS capsule, TAKE 1 CAPSULE (0.4 MG TOTAL) BY MOUTH DAILY AFTER SUPPER., Disp: 90 capsule, Rfl: 1 .  triamcinolone cream (KENALOG) 0.1 %, Apply 1 application topically 2 (two) times daily as needed., Disp: 30 g, Rfl: 1 .  urea (CARMOL) 10 % cream, APPLY TO AFFECTED AREA TWICE A DAY AS NEEDED, Disp: , Rfl: 3 .  Urea 40 % LOTN, Apply 1 application topically 2 (two) times a week., Disp: , Rfl: 3 .  VOLTAREN 1 % GEL, Apply 2 g topically daily. , Disp: , Rfl:  .  zolpidem (AMBIEN) 10 MG tablet, Take 10 mg by mouth at bedtime as needed for sleep., Disp: , Rfl:   EXAM:  Vitals:   01/07/18 1453  BP: 110/78  Pulse: 68  Temp: 98.2 F (36.8 C)    Body mass index is 27.28 kg/m.  GENERAL: vitals reviewed and listed above, alert, oriented, appears well hydrated and in no acute distress  HEENT: atraumatic, conjunttiva clear, no obvious abnormalities on inspection of external nose and ears  NECK: no obvious masses on inspection, bilateral soft mound that our symmetric in the lower lateral neck, no tenderness, crepitus, redness, etc  LUNGS: clear to auscultation bilaterally, no wheezes, rales or rhonchi, good air movement  CV: HRRR, no peripheral edema  MS: moves all extremities without noticeable abnormality  PSYCH: pleasant and cooperative, no obvious depression or anxiety  ASSESSMENT AND PLAN:  Discussed the following assessment and plan:  Neck swelling  -we discussed possible serious and likely etiologies, workup and treatment, treatment risks and return precautions - given he feels there is no change here  this is likely fat pads. Offered CT scan or other imaging to evaluate. -after this discussion, [place] opted for observation as he feels it is his normal. Advised to let us know if any changes or decides to look into it. -of course, we advised Yogi  to return or notify a doctor immediately if symptoms worsen or persist or new concerns arise.   Patient Instructions  I hope you are feeling better soon! Seek care promptly if your symptoms worsen, new concerns arise or you are not improving with treatment.  Let us know if you want to get imaging.  Suresh Audi R Yomaris Palecek, DO   

## 2018-01-07 NOTE — Patient Instructions (Signed)
I hope you are feeling better soon! Seek care promptly if your symptoms worsen, new concerns arise or you are not improving with treatment.  Let us know if you want to get imaging.

## 2018-01-08 DIAGNOSIS — M5136 Other intervertebral disc degeneration, lumbar region: Secondary | ICD-10-CM | POA: Diagnosis not present

## 2018-01-08 DIAGNOSIS — M47816 Spondylosis without myelopathy or radiculopathy, lumbar region: Secondary | ICD-10-CM | POA: Diagnosis not present

## 2018-01-09 ENCOUNTER — Other Ambulatory Visit: Payer: Self-pay | Admitting: Family Medicine

## 2018-01-11 DIAGNOSIS — M25511 Pain in right shoulder: Secondary | ICD-10-CM | POA: Diagnosis not present

## 2018-01-11 DIAGNOSIS — M50923 Unspecified cervical disc disorder at C6-C7 level: Secondary | ICD-10-CM | POA: Diagnosis not present

## 2018-01-11 DIAGNOSIS — M5136 Other intervertebral disc degeneration, lumbar region: Secondary | ICD-10-CM | POA: Diagnosis not present

## 2018-01-11 DIAGNOSIS — M50922 Unspecified cervical disc disorder at C5-C6 level: Secondary | ICD-10-CM | POA: Diagnosis not present

## 2018-01-12 ENCOUNTER — Ambulatory Visit (INDEPENDENT_AMBULATORY_CARE_PROVIDER_SITE_OTHER): Payer: BLUE CROSS/BLUE SHIELD | Admitting: Family Medicine

## 2018-01-12 ENCOUNTER — Encounter: Payer: Self-pay | Admitting: Family Medicine

## 2018-01-12 ENCOUNTER — Other Ambulatory Visit: Payer: Self-pay

## 2018-01-12 ENCOUNTER — Encounter

## 2018-01-12 VITALS — BP 100/60 | HR 67 | Temp 98.4°F | Ht 74.0 in | Wt 213.6 lb

## 2018-01-12 DIAGNOSIS — R221 Localized swelling, mass and lump, neck: Secondary | ICD-10-CM

## 2018-01-12 NOTE — Progress Notes (Signed)
  Subjective:     Patient ID: Peter Snyder, male   DOB: 05-31-1955, 62 y.o.   MRN: [id number]  HPI Patient is seen with concerns of her some neck "fullness ".  He states his wife incidentally noted that he seemed to look swollen lower neck region bilaterally about a week ago.  Patient came in Friday for assessment and basically was reassured and no further studies were ordered.  He wanted to come in today for another opinion.  He has not noted any lymphadenopathy.  No rash.  No dyspnea.  No pleuritic pain.  No fevers or chills.  No difficulty swallowing.  He has been walking 3 to 4 miles daily without difficulty.  No chest pain  Past Medical History:  Diagnosis Date  . Arthritis   . CAD (coronary artery disease) 6/14   Cardiac catheterization 6/27/ 2014 ejection fraction 35-40%, 30% proximal left circumflex, 100% tiny obtuse marginal 1 with collaterals, 50% LAD, 50% D1, 100% RCA with collaterals  . Chicken pox   . Colon polyps   . Depression   . Diabetes mellitus ([place])    Diet controlled  . GERD (gastroesophageal reflux disease)   . Hyperlipidemia   . Hypertension   . Kidney stone    Past Surgical History:  Procedure Laterality Date  . TONSILLECTOMY  1963    reports that he quit smoking about 15 years ago. He has never used smokeless tobacco. He reports that he does not drink alcohol or use drugs. family history includes Arthritis in his father and paternal grandmother; Clotting disorder in his paternal grandfather and paternal grandmother; Colon polyps in his father; Heart disease in his maternal grandmother; Hyperlipidemia in his maternal grandmother; Liver cancer in his paternal grandfather; Melanoma in his mother. Allergies  Allergen Reactions  . Latex Other (See Comments)    Other  . Sulfa Antibiotics Rash     Review of Systems  Constitutional: Negative for appetite change, chills, fever and unexpected weight change.  HENT: Negative for sore throat and trouble swallowing.    Respiratory: Negative for cough, shortness of breath and wheezing.   Cardiovascular: Negative for chest pain and leg swelling.  Hematological: Negative for adenopathy.       Objective:   Physical Exam  Constitutional: He appears well-developed and well-nourished.  Neck: Neck supple. No thyromegaly present.  Area in question patient points to is really more supraclavicular bilaterally.  He has no palpable masses.  No adenopathy.  Minimal fatty tissue.  Nontender.  Cardiovascular: Normal rate and regular rhythm.  Pulmonary/Chest: Effort normal and breath sounds normal.  Lymphadenopathy:    He has no cervical adenopathy.       Assessment:     Patient presents with concern for bilateral lower neck prominence.  No pathologic findings.  He does not have any subcutaneous crepitus/emphysema, adenopathy, or any other concerns.  No masses palpated.    Plan:     -Reassurance given.  We have not recommended any further imaging at this point. -Set up complete physical for December  [name] MD [facility]

## 2018-01-12 NOTE — Patient Instructions (Signed)
Set up physical for December this year.

## 2018-01-13 DIAGNOSIS — F411 Generalized anxiety disorder: Secondary | ICD-10-CM | POA: Diagnosis not present

## 2018-01-14 DIAGNOSIS — H15109 Unspecified episcleritis, unspecified eye: Secondary | ICD-10-CM | POA: Diagnosis not present

## 2018-01-14 NOTE — Progress Notes (Signed)
HPI: FU coronary artery disease. Previous cardiac care in [place]. Cardiac catheterization performed in June of 2014 showed an ejection fraction of 35-40%. The inferior wall is akinetic. The left main was normal. There was a 30% proximal circumflex, occluded small first obtuse marginal with collaterals, a 50% ramus intermedius, 50% LAD, 50% first diagonal and an occluded right coronary artery with collaterals. Treated medically. Nuclear study January 2017 showed ejection fraction 35%. There was a prior inferior infarct with mild peri-infarct ischemia. Echocardiogram In January 2017 interpretednormal LV systolic function, trace aortic insufficiency mild mitral regurgitation and mild left atrial enlargement. I previously reviewed the echocardiogram and there appearsedto be akinesis of the inferior and basal inferior lateral wall. Ejection fraction in the 35-40% range. Cardiac MRI May 2017 showed akinetic inferior wall with ejection fraction 40%. Biatrial enlargement and mild aortic/mitral regurgitation.MRA 3/19 showed mildly dilated aortic rootmeasuring 3.9 cm.Since last seen,the patient denies any dyspnea on exertion, orthopnea, PND, pedal edema, palpitations, syncope or chest pain.   Current Outpatient Medications  Medication Sig Dispense Refill  . acetaminophen (TYLENOL) 500 MG tablet Take 500 mg by mouth every 6 (six) hours as needed for moderate pain.    Marland Kitchen ALPRAZolam (XANAX) 0.25 MG tablet Take 0.25 mg by mouth daily.     Marland Kitchen aspirin 81 MG tablet Take 81 mg by mouth daily.    Marland Kitchen atorvastatin (LIPITOR) 80 MG tablet TAKE 1 TABLET BY MOUTH EVERY DAY 90 tablet 1  . azelastine (ASTELIN) 0.1 % nasal spray Place 1 spray into both nostrils 2 (two) times daily. Use in each nostril as directed 30 mL 12  . Bismuth Tribromoph-Petrolatum (XEROFORM PETROLAT PATCH 4"X4") PADS USE AS DIRECTED TWICE A DAY EXTERNALLY 30 DAYS  4  . carvedilol (COREG) 12.5 MG tablet TAKE 1 TABLET (12.5 MG TOTAL) BY MOUTH 2  (TWO) TIMES DAILY WITH A MEAL. 180 tablet 2  . celecoxib (CELEBREX) 200 MG capsule Take 200 mg by mouth daily.     . clonazePAM (KLONOPIN) 0.5 MG tablet Take 0.5 mg by mouth at bedtime.     . Dermatological Products, Misc. (LEVICYN) GEL Apply 1 application topically 2 (two) times daily after a meal.  2  . Dermatological Products, Misc. (TETRIX) CREA Apply topically See admin instructions.  3  . dexamethasone 0.5 MG/5ML elixir 2-3 tsps swish and spit QID prn. 237 mL 2  . ELIDEL 1 % cream APPLY TO AFFECTED AREA TWICE A DAY AS NEEDED FOR 14 DAYS  2  . escitalopram (LEXAPRO) 20 MG tablet Take 20 mg by mouth daily.    Marland Kitchen ezetimibe (ZETIA) 10 MG tablet TAKE 1 TABLET (10 MG TOTAL) BY MOUTH DAILY. 90 tablet 1  . fluticasone (FLONASE) 50 MCG/ACT nasal spray INHALE 2 SPRAYS IN EACH NOSTRIL EVERY DAY 48 g 2  . gabapentin (NEURONTIN) 600 MG tablet TAKE 1 TABLET BY MOUTH TWICE A DAY 180 tablet 1  . lisinopril (PRINIVIL,ZESTRIL) 20 MG tablet Take 20 mg by mouth daily.    Marland Kitchen LOTEMAX 0.5 % GEL     . pantoprazole (PROTONIX) 40 MG tablet TAKE 1 TABLET BY MOUTH EVERY DAY 90 tablet 3  . RESTASIS 0.05 % ophthalmic emulsion Place 1 drop into both eyes 2 (two) times daily.     . tacrolimus (PROTOPIC) 0.1 % ointment     . tamsulosin (FLOMAX) 0.4 MG CAPS capsule TAKE 1 CAPSULE (0.4 MG TOTAL) BY MOUTH DAILY AFTER SUPPER. 90 capsule 1  . triamcinolone cream (KENALOG) 0.1 %  Apply 1 application topically 2 (two) times daily as needed. 30 g 1  . urea (CARMOL) 10 % cream APPLY TO AFFECTED AREA TWICE A DAY AS NEEDED  3  . Urea 40 % LOTN Apply 1 application topically 2 (two) times a week.  3  . VOLTAREN 1 % GEL Apply 2 g topically daily.     Marland Kitchen zolpidem (AMBIEN) 10 MG tablet Take 10 mg by mouth at bedtime as needed for sleep.     No current facility-administered medications for this visit.      Past Medical History:  Diagnosis Date  . Arthritis   . CAD (coronary artery disease) 6/14   Cardiac catheterization 6/27/ 2014  ejection fraction 35-40%, 30% proximal left circumflex, 100% tiny obtuse marginal 1 with collaterals, 50% LAD, 50% D1, 100% RCA with collaterals  . Chicken pox   . Colon polyps   . Depression   . Diabetes mellitus ([place])    Diet controlled  . GERD (gastroesophageal reflux disease)   . Hyperlipidemia   . Hypertension   . Kidney stone     Past Surgical History:  Procedure Laterality Date  . TONSILLECTOMY  1963    Social History   Socioeconomic History  . Marital status: Married    Spouse name: Not on file  . Number of children: 3  . Years of education: Not on file  . Highest education level: Not on file  Occupational History  . Occupation: retired    Comment: Retired  Scientific laboratory technician  . Financial resource strain: Not on file  . Food insecurity:    Worry: Not on file    Inability: Not on file  . Transportation needs:    Medical: Not on file    Non-medical: Not on file  Tobacco Use  . Smoking status: Former Smoker    Last attempt to quit: 09/15/2002    Years since quitting: 15.3  . Smokeless tobacco: Never Used  . Tobacco comment: quit 15 years ago -socially  Substance and Sexual Activity  . Alcohol use: No    Alcohol/week: 0.0 standard drinks  . Drug use: No  . Sexual activity: Not on file  Lifestyle  . Physical activity:    Days per week: Not on file    Minutes per session: Not on file  . Stress: Not on file  Relationships  . Social connections:    Talks on phone: Not on file    Gets together: Not on file    Attends religious service: Not on file    Active member of club or organization: Not on file    Attends meetings of clubs or organizations: Not on file    Relationship status: Not on file  . Intimate partner violence:    Fear of current or ex partner: Not on file    Emotionally abused: Not on file    Physically abused: Not on file    Forced sexual activity: Not on file  Other Topics Concern  . Not on file  Social History Narrative  . Not on file     Family History  Problem Relation Age of Onset  . Melanoma Mother        metastatic  . Arthritis Father   . Colon polyps Father   . Hyperlipidemia Maternal Grandmother   . Heart disease Maternal Grandmother   . Arthritis Paternal Grandmother   . Clotting disorder Paternal Grandmother   . Clotting disorder Paternal Grandfather   . Liver cancer Paternal Grandfather   .  Colon cancer Neg Hx   . Esophageal cancer Neg Hx   . Rectal cancer Neg Hx     ROS: no fevers or chills, productive cough, hemoptysis, dysphasia, odynophagia, melena, hematochezia, dysuria, hematuria, rash, seizure activity, orthopnea, PND, pedal edema, claudication. Remaining systems are negative.  Physical Exam: Well-developed well-nourished in no acute distress.  Skin is warm and dry.  HEENT is normal.  Neck is supple.  Chest is clear to auscultation with normal expansion.  Cardiovascular exam is regular rate and rhythm.  Abdominal exam nontender or distended. No masses palpated. Extremities show no edema. neuro grossly intact  ECG-sinus rhythm with occasional PVC, RV conduction delay, inferior infarct.  Personally reviewed  A/P  1 coronary artery disease-patient denies dyspnea or chest pain.  Continue medical therapy with aspirin and statin.  2 ischemic cardiomyopathy-plan to continue beta-blocker and ACE inhibitor.  No evidence of congestive heart failure. Plan repeat echo when he returns in six months.  3 dilated aortic root-we will repeat MRA March 2021.  4 hypertension-patient's blood pressure is controlled.  Continue present medications and follow.  5 hyperlipidemia-continue statin.  Kirk Ruths, MD

## 2018-01-16 ENCOUNTER — Other Ambulatory Visit: Payer: Self-pay | Admitting: Family Medicine

## 2018-01-18 DIAGNOSIS — M25511 Pain in right shoulder: Secondary | ICD-10-CM | POA: Diagnosis not present

## 2018-01-18 DIAGNOSIS — M5136 Other intervertebral disc degeneration, lumbar region: Secondary | ICD-10-CM | POA: Diagnosis not present

## 2018-01-18 DIAGNOSIS — M50923 Unspecified cervical disc disorder at C6-C7 level: Secondary | ICD-10-CM | POA: Diagnosis not present

## 2018-01-18 DIAGNOSIS — M50922 Unspecified cervical disc disorder at C5-C6 level: Secondary | ICD-10-CM | POA: Diagnosis not present

## 2018-01-22 DIAGNOSIS — M542 Cervicalgia: Secondary | ICD-10-CM | POA: Diagnosis not present

## 2018-01-22 DIAGNOSIS — G894 Chronic pain syndrome: Secondary | ICD-10-CM | POA: Diagnosis not present

## 2018-01-22 DIAGNOSIS — M545 Low back pain: Secondary | ICD-10-CM | POA: Diagnosis not present

## 2018-01-25 ENCOUNTER — Encounter: Payer: Self-pay | Admitting: Family Medicine

## 2018-01-25 DIAGNOSIS — M50923 Unspecified cervical disc disorder at C6-C7 level: Secondary | ICD-10-CM | POA: Diagnosis not present

## 2018-01-25 DIAGNOSIS — M25511 Pain in right shoulder: Secondary | ICD-10-CM | POA: Diagnosis not present

## 2018-01-25 DIAGNOSIS — M50922 Unspecified cervical disc disorder at C5-C6 level: Secondary | ICD-10-CM | POA: Diagnosis not present

## 2018-01-25 DIAGNOSIS — M5136 Other intervertebral disc degeneration, lumbar region: Secondary | ICD-10-CM | POA: Diagnosis not present

## 2018-01-26 ENCOUNTER — Ambulatory Visit (INDEPENDENT_AMBULATORY_CARE_PROVIDER_SITE_OTHER): Payer: BLUE CROSS/BLUE SHIELD | Admitting: Cardiology

## 2018-01-26 ENCOUNTER — Encounter: Payer: Self-pay | Admitting: Cardiology

## 2018-01-26 VITALS — BP 138/78 | HR 58 | Ht 74.0 in | Wt 217.0 lb

## 2018-01-26 DIAGNOSIS — I712 Thoracic aortic aneurysm, without rupture, unspecified: Secondary | ICD-10-CM

## 2018-01-26 DIAGNOSIS — I10 Essential (primary) hypertension: Secondary | ICD-10-CM | POA: Diagnosis not present

## 2018-01-26 DIAGNOSIS — I255 Ischemic cardiomyopathy: Secondary | ICD-10-CM

## 2018-01-26 DIAGNOSIS — I2583 Coronary atherosclerosis due to lipid rich plaque: Secondary | ICD-10-CM

## 2018-01-26 DIAGNOSIS — I2510 Atherosclerotic heart disease of native coronary artery without angina pectoris: Secondary | ICD-10-CM | POA: Diagnosis not present

## 2018-01-26 DIAGNOSIS — E7800 Pure hypercholesterolemia, unspecified: Secondary | ICD-10-CM

## 2018-01-26 NOTE — Patient Instructions (Signed)
Medication Instructions:  Your physician recommends that you continue on your current medications as directed. Please refer to the Current Medication list given to you today.  If you need a refill on your cardiac medications before your next appointment, please call your pharmacy.   Lab work: None ordered If you have labs (blood work) drawn today and your tests are completely normal, you will receive your results only by: Marland Kitchen MyChart Message (if you have MyChart) OR . A paper copy in the mail If you have any lab test that is abnormal or we need to change your treatment, we will call you to review the results.  Testing/Procedures: None ordered  Follow-Up: At [facility], you and your health needs are our priority.  As part of our continuing mission to provide you with exceptional heart care, we have created designated Provider Care Teams.  These Care Teams include your primary Cardiologist (physician) and Advanced Practice Providers (APPs -  Physician Assistants and Nurse Practitioners) who all work together to provide you with the care you need, when you need it. You will need a follow up appointment in 6 months.  Please call our office 2 months in advance to schedule this appointment.  You may see Dr.Crenshaw or one of the following Advanced Practice Providers on your designated Care Team:   Kerin Ransom, PA-C Roby Lofts, [place] . Sande Rives, PA-C  Any Other Special Instructions Will Be Listed Below (If Applicable).

## 2018-01-27 DIAGNOSIS — Z713 Dietary counseling and surveillance: Secondary | ICD-10-CM | POA: Diagnosis not present

## 2018-02-01 DIAGNOSIS — M65332 Trigger finger, left middle finger: Secondary | ICD-10-CM | POA: Diagnosis not present

## 2018-02-02 DIAGNOSIS — G5761 Lesion of plantar nerve, right lower limb: Secondary | ICD-10-CM | POA: Diagnosis not present

## 2018-02-02 DIAGNOSIS — M25571 Pain in right ankle and joints of right foot: Secondary | ICD-10-CM | POA: Diagnosis not present

## 2018-02-03 DIAGNOSIS — M1712 Unilateral primary osteoarthritis, left knee: Secondary | ICD-10-CM | POA: Diagnosis not present

## 2018-02-04 DIAGNOSIS — M50922 Unspecified cervical disc disorder at C5-C6 level: Secondary | ICD-10-CM | POA: Diagnosis not present

## 2018-02-04 DIAGNOSIS — M25511 Pain in right shoulder: Secondary | ICD-10-CM | POA: Diagnosis not present

## 2018-02-04 DIAGNOSIS — M5136 Other intervertebral disc degeneration, lumbar region: Secondary | ICD-10-CM | POA: Diagnosis not present

## 2018-02-04 DIAGNOSIS — M50923 Unspecified cervical disc disorder at C6-C7 level: Secondary | ICD-10-CM | POA: Diagnosis not present

## 2018-02-09 DIAGNOSIS — M50923 Unspecified cervical disc disorder at C6-C7 level: Secondary | ICD-10-CM | POA: Diagnosis not present

## 2018-02-09 DIAGNOSIS — M5136 Other intervertebral disc degeneration, lumbar region: Secondary | ICD-10-CM | POA: Diagnosis not present

## 2018-02-09 DIAGNOSIS — M50922 Unspecified cervical disc disorder at C5-C6 level: Secondary | ICD-10-CM | POA: Diagnosis not present

## 2018-02-09 DIAGNOSIS — M25511 Pain in right shoulder: Secondary | ICD-10-CM | POA: Diagnosis not present

## 2018-02-15 ENCOUNTER — Other Ambulatory Visit: Payer: Self-pay

## 2018-02-15 ENCOUNTER — Encounter: Payer: Self-pay | Admitting: Family Medicine

## 2018-02-15 ENCOUNTER — Ambulatory Visit (INDEPENDENT_AMBULATORY_CARE_PROVIDER_SITE_OTHER): Payer: BLUE CROSS/BLUE SHIELD | Admitting: Family Medicine

## 2018-02-15 ENCOUNTER — Ambulatory Visit: Payer: BLUE CROSS/BLUE SHIELD | Admitting: Family Medicine

## 2018-02-15 VITALS — BP 104/62 | HR 75 | Temp 97.8°F | Ht 74.4 in | Wt 214.8 lb

## 2018-02-15 DIAGNOSIS — H6982 Other specified disorders of Eustachian tube, left ear: Secondary | ICD-10-CM

## 2018-02-15 DIAGNOSIS — M25511 Pain in right shoulder: Secondary | ICD-10-CM | POA: Diagnosis not present

## 2018-02-15 DIAGNOSIS — M50922 Unspecified cervical disc disorder at C5-C6 level: Secondary | ICD-10-CM | POA: Diagnosis not present

## 2018-02-15 DIAGNOSIS — M5136 Other intervertebral disc degeneration, lumbar region: Secondary | ICD-10-CM | POA: Diagnosis not present

## 2018-02-15 DIAGNOSIS — M25642 Stiffness of left hand, not elsewhere classified: Secondary | ICD-10-CM | POA: Diagnosis not present

## 2018-02-15 DIAGNOSIS — M50923 Unspecified cervical disc disorder at C6-C7 level: Secondary | ICD-10-CM | POA: Diagnosis not present

## 2018-02-15 NOTE — Progress Notes (Signed)
  Subjective:     Patient ID: Peter Snyder, male   DOB: 1955/09/28, 62 y.o.   MRN: [id number]  HPI Here for 1 day history of sinus congestive symptoms.  Has had some facial pressure and mild headache.  No fever.  Yesterday felt like his left eye was swollen and he feels that he may have some fluid in his left ear.  He has some chronic tinnitus.  No vertigo.  No purulent secretions and no bloody discharge.  Past Medical History:  Diagnosis Date  . Arthritis   . CAD (coronary artery disease) 6/14   Cardiac catheterization 6/27/ 2014 ejection fraction 35-40%, 30% proximal left circumflex, 100% tiny obtuse marginal 1 with collaterals, 50% LAD, 50% D1, 100% RCA with collaterals  . Chicken pox   . Colon polyps   . Depression   . Diabetes mellitus ([place])    Diet controlled  . GERD (gastroesophageal reflux disease)   . Hyperlipidemia   . Hypertension   . Kidney stone    Past Surgical History:  Procedure Laterality Date  . TONSILLECTOMY  1963  . TRIGGER FINGER RELEASE  2019    reports that he quit smoking about 15 years ago. He has never used smokeless tobacco. He reports that he does not drink alcohol or use drugs. family history includes Arthritis in his father and paternal grandmother; Clotting disorder in his paternal grandfather and paternal grandmother; Colon polyps in his father; Heart disease in his maternal grandmother; Hyperlipidemia in his maternal grandmother; Liver cancer in his paternal grandfather; Melanoma in his mother. Allergies  Allergen Reactions  . Latex Other (See Comments)    Other  . Sulfa Antibiotics Rash     Review of Systems  Constitutional: Negative for chills and fever.  HENT: Positive for congestion, sinus pressure and sinus pain.   Respiratory: Negative for cough and shortness of breath.        Objective:   Physical Exam  Constitutional: He appears well-developed and well-nourished.  HENT:  Mouth/Throat: Oropharynx is clear and moist.  Eardrums  appear normal.  No visible effusion  Neck: Neck supple.  Cardiovascular: Normal rate.  Pulmonary/Chest: Effort normal and breath sounds normal.  Lymphadenopathy:    He has no cervical adenopathy.       Assessment:     Probable left eustachian tube dysfunction.  He has sinus congestive symptoms but only 1 day duration    Plan:     - recommend conservative management with increase fluids and observation at this time -Follow-up for any persistent or worsening symptoms  [name] MD [facility]

## 2018-02-15 NOTE — Patient Instructions (Signed)
Eustachian Tube Dysfunction The eustachian tube connects the middle ear to the back of the nose. It regulates air pressure in the middle ear by allowing air to move between the ear and nose. It also helps to drain fluid from the middle ear space. When the eustachian tube does not function properly, air pressure, fluid, or both can build up in the middle ear. Eustachian tube dysfunction can affect one or both ears. What are the causes? This condition happens when the eustachian tube becomes blocked or cannot open normally. This may result from:  Ear infections.  Colds and other upper respiratory infections.  Allergies.  Irritation, such as from cigarette smoke or acid from the stomach coming up into the esophagus (gastroesophageal reflux).  Sudden changes in air pressure, such as from descending in an airplane.  Abnormal growths in the nose or throat, such as nasal polyps, tumors, or enlarged tissue at the back of the throat (adenoids).  What increases the risk? This condition may be more likely to develop in people who smoke and people who are overweight. Eustachian tube dysfunction may also be more likely to develop in children, especially children who have:  Certain birth defects of the mouth, such as cleft palate.  Large tonsils and adenoids.  What are the signs or symptoms? Symptoms of this condition may include:  A feeling of fullness in the ear.  Ear pain.  Clicking or popping noises in the ear.  Ringing in the ear.  Hearing loss.  Loss of balance.  Symptoms may get worse when the air pressure around you changes, such as when you travel to an area of high elevation or fly on an airplane. How is this diagnosed? This condition may be diagnosed based on:  Your symptoms.  A physical exam of your ear, nose, and throat.  Tests, such as those that measure: ? The movement of your eardrum (tympanogram). ? Your hearing (audiometry).  How is this treated? Treatment  depends on the cause and severity of your condition. If your symptoms are mild, you may be able to relieve your symptoms by moving air into ("popping") your ears. If you have symptoms of fluid in your ears, treatment may include:  Decongestants.  Antihistamines.  Nasal sprays or ear drops that contain medicines that reduce swelling (steroids).  In some cases, you may need to have a procedure to drain the fluid in your eardrum (myringotomy). In this procedure, a small tube is placed in the eardrum to:  Drain the fluid.  Restore the air in the middle ear space.  Follow these instructions at home:  Take over-the-counter and prescription medicines only as told by your health care provider.  Use techniques to help pop your ears as recommended by your health care provider. These may include: ? Chewing gum. ? Yawning. ? Frequent, forceful swallowing. ? Closing your mouth, holding your nose closed, and gently blowing as if you are trying to blow air out of your nose.  Do not do any of the following until your health care provider approves: ? Travel to high altitudes. ? Fly in airplanes. ? Work in a pressurized cabin or room. ? Scuba dive.  Keep your ears dry. Dry your ears completely after showering or bathing.  Do not smoke.  Keep all follow-up visits as told by your health care provider. This is important. Contact a health care provider if:  Your symptoms do not go away after treatment.  Your symptoms come back after treatment.  You are   unable to pop your ears.  You have: ? A fever. ? Pain in your ear. ? Pain in your head or neck. ? Fluid draining from your ear.  Your hearing suddenly changes.  You become very dizzy.  You lose your balance. This information is not intended to replace advice given to you by your health care provider. Make sure you discuss any questions you have with your health care provider. Document Released: 03/30/2015 Document Revised: 08/09/2015  Document Reviewed: 03/22/2014 Elsevier Interactive Patient Education  2018 Elsevier Inc.  

## 2018-02-16 DIAGNOSIS — M5030 Other cervical disc degeneration, unspecified cervical region: Secondary | ICD-10-CM | POA: Diagnosis not present

## 2018-02-16 DIAGNOSIS — M5136 Other intervertebral disc degeneration, lumbar region: Secondary | ICD-10-CM | POA: Diagnosis not present

## 2018-02-16 DIAGNOSIS — M47816 Spondylosis without myelopathy or radiculopathy, lumbar region: Secondary | ICD-10-CM | POA: Diagnosis not present

## 2018-02-16 DIAGNOSIS — M791 Myalgia, unspecified site: Secondary | ICD-10-CM | POA: Diagnosis not present

## 2018-02-17 DIAGNOSIS — Z713 Dietary counseling and surveillance: Secondary | ICD-10-CM | POA: Diagnosis not present

## 2018-02-18 ENCOUNTER — Ambulatory Visit (INDEPENDENT_AMBULATORY_CARE_PROVIDER_SITE_OTHER): Payer: BLUE CROSS/BLUE SHIELD | Admitting: Internal Medicine

## 2018-02-18 ENCOUNTER — Encounter: Payer: Self-pay | Admitting: Internal Medicine

## 2018-02-18 VITALS — BP 120/70 | HR 70 | Temp 98.3°F | Wt 219.0 lb

## 2018-02-18 DIAGNOSIS — R300 Dysuria: Secondary | ICD-10-CM

## 2018-02-18 DIAGNOSIS — M50923 Unspecified cervical disc disorder at C6-C7 level: Secondary | ICD-10-CM | POA: Diagnosis not present

## 2018-02-18 DIAGNOSIS — M25562 Pain in left knee: Secondary | ICD-10-CM | POA: Diagnosis not present

## 2018-02-18 DIAGNOSIS — M25511 Pain in right shoulder: Secondary | ICD-10-CM | POA: Diagnosis not present

## 2018-02-18 DIAGNOSIS — M50922 Unspecified cervical disc disorder at C5-C6 level: Secondary | ICD-10-CM | POA: Diagnosis not present

## 2018-02-18 DIAGNOSIS — M7052 Other bursitis of knee, left knee: Secondary | ICD-10-CM | POA: Diagnosis not present

## 2018-02-18 DIAGNOSIS — M5136 Other intervertebral disc degeneration, lumbar region: Secondary | ICD-10-CM | POA: Diagnosis not present

## 2018-02-18 LAB — POCT URINALYSIS DIPSTICK
BILIRUBIN UA: NEGATIVE
Blood, UA: NEGATIVE
GLUCOSE UA: NEGATIVE
Ketones, UA: NEGATIVE
Leukocytes, UA: NEGATIVE
Nitrite, UA: NEGATIVE
Protein, UA: NEGATIVE
SPEC GRAV UA: 1.01 (ref 1.010–1.025)
Urobilinogen, UA: 0.2 E.U./dL
pH, UA: 6 (ref 5.0–8.0)

## 2018-02-18 NOTE — Patient Instructions (Signed)
-  It was nice meeting you today!  -Make sure to plenty of water exercise, we will let you know if there are any abnormalities on the urine culture.

## 2018-02-18 NOTE — Progress Notes (Signed)
Acute Office Visit     CC/Reason for Visit: Dysuria   HPI: Peter Snyder is a 62 y.o. male who is coming in today as a work in for dysuria.  He states he was working out today at around 1230 and when he went to urinate felt a sharp pain like a cramping sensation in his urethra.  He has a history of kidney stones and is on Flomax.  Denies any obstructive symptoms.  He has since urinated without complaints.  He wanted to make sure he did not have a UTI.  He denies abdominal pain, fevers or chills.  Past Medical/Surgical History: Past Medical History:  Diagnosis Date  . Arthritis   . CAD (coronary artery disease) 6/14   Cardiac catheterization 6/27/ 2014 ejection fraction 35-40%, 30% proximal left circumflex, 100% tiny obtuse marginal 1 with collaterals, 50% LAD, 50% D1, 100% RCA with collaterals  . Chicken pox   . Colon polyps   . Depression   . Diabetes mellitus ([place])    Diet controlled  . GERD (gastroesophageal reflux disease)   . Hyperlipidemia   . Hypertension   . Kidney stone     Past Surgical History:  Procedure Laterality Date  . TONSILLECTOMY  1963  . TRIGGER FINGER RELEASE  2019    Social History:  reports that he quit smoking about 15 years ago. He has never used smokeless tobacco. He reports that he does not drink alcohol or use drugs.  Allergies: Allergies  Allergen Reactions  . Latex Other (See Comments)    Other  . Sulfa Antibiotics Rash    Family History:  Family History  Problem Relation Age of Onset  . Melanoma Mother        metastatic  . Arthritis Father   . Colon polyps Father   . Hyperlipidemia Maternal Grandmother   . Heart disease Maternal Grandmother   . Arthritis Paternal Grandmother   . Clotting disorder Paternal Grandmother   . Clotting disorder Paternal Grandfather   . Liver cancer Paternal Grandfather   . Colon cancer Neg Hx   . Esophageal cancer Neg Hx   . Rectal cancer Neg Hx      Current Outpatient Medications:  .   acetaminophen (TYLENOL) 500 MG tablet, Take 500 mg by mouth every 6 (six) hours as needed for moderate pain., Disp: , Rfl:  .  ALPRAZolam (XANAX) 0.25 MG tablet, Take 0.25 mg by mouth daily. , Disp: , Rfl:  .  aspirin 81 MG tablet, Take 81 mg by mouth daily., Disp: , Rfl:  .  atorvastatin (LIPITOR) 80 MG tablet, TAKE 1 TABLET BY MOUTH EVERY DAY, Disp: 90 tablet, Rfl: 1 .  azelastine (ASTELIN) 0.1 % nasal spray, Place 1 spray into both nostrils 2 (two) times daily. Use in each nostril as directed, Disp: 30 mL, Rfl: 12 .  Bismuth Tribromoph-Petrolatum (XEROFORM PETROLAT PATCH 4"X4") PADS, USE AS DIRECTED TWICE A DAY EXTERNALLY 30 DAYS, Disp: , Rfl: 4 .  carvedilol (COREG) 12.5 MG tablet, TAKE 1 TABLET (12.5 MG TOTAL) BY MOUTH 2 (TWO) TIMES DAILY WITH A MEAL., Disp: 180 tablet, Rfl: 2 .  celecoxib (CELEBREX) 200 MG capsule, Take 200 mg by mouth daily. , Disp: , Rfl:  .  clonazePAM (KLONOPIN) 0.5 MG tablet, Take 0.5 mg by mouth at bedtime. , Disp: , Rfl:  .  Dermatological Products, Misc. (LEVICYN) GEL, Apply 1 application topically 2 (two) times daily after a meal., Disp: , Rfl: 2 .  Dermatological  Products, Misc. (TETRIX) CREA, Apply topically See admin instructions., Disp: , Rfl: 3 .  dexamethasone 0.5 MG/5ML elixir, 2-3 tsps swish and spit QID prn., Disp: 237 mL, Rfl: 2 .  ELIDEL 1 % cream, APPLY TO AFFECTED AREA TWICE A DAY AS NEEDED FOR 14 DAYS, Disp: , Rfl: 2 .  escitalopram (LEXAPRO) 20 MG tablet, Take 20 mg by mouth daily., Disp: , Rfl:  .  ezetimibe (ZETIA) 10 MG tablet, TAKE 1 TABLET (10 MG TOTAL) BY MOUTH DAILY., Disp: 90 tablet, Rfl: 1 .  fluticasone (FLONASE) 50 MCG/ACT nasal spray, INHALE 2 SPRAYS IN EACH NOSTRIL EVERY DAY, Disp: 48 g, Rfl: 2 .  gabapentin (NEURONTIN) 600 MG tablet, TAKE 1 TABLET BY MOUTH TWICE A DAY, Disp: 180 tablet, Rfl: 1 .  lisinopril (PRINIVIL,ZESTRIL) 20 MG tablet, Take 20 mg by mouth daily., Disp: , Rfl:  .  LOTEMAX 0.5 % GEL, , Disp: , Rfl:  .  pantoprazole  (PROTONIX) 40 MG tablet, TAKE 1 TABLET BY MOUTH EVERY DAY, Disp: 90 tablet, Rfl: 3 .  RESTASIS 0.05 % ophthalmic emulsion, Place 1 drop into both eyes 2 (two) times daily. , Disp: , Rfl:  .  tacrolimus (PROTOPIC) 0.1 % ointment, , Disp: , Rfl:  .  tamsulosin (FLOMAX) 0.4 MG CAPS capsule, TAKE 1 CAPSULE (0.4 MG TOTAL) BY MOUTH DAILY AFTER SUPPER., Disp: 90 capsule, Rfl: 1 .  triamcinolone cream (KENALOG) 0.1 %, Apply 1 application topically 2 (two) times daily as needed., Disp: 30 g, Rfl: 1 .  urea (CARMOL) 10 % cream, APPLY TO AFFECTED AREA TWICE A DAY AS NEEDED, Disp: , Rfl: 3 .  Urea 40 % LOTN, Apply 1 application topically 2 (two) times a week., Disp: , Rfl: 3 .  VOLTAREN 1 % GEL, Apply 2 g topically daily. , Disp: , Rfl:  .  zolpidem (AMBIEN) 10 MG tablet, Take 10 mg by mouth at bedtime as needed for sleep., Disp: , Rfl:   Review of systems is negative except as mentioned in HPI.   Physical Exam: Vitals:   02/18/18 1358  BP: 120/70  Pulse: 70  Temp: 98.3 F (36.8 C)  TempSrc: Oral  SpO2: 97%  Weight: 219 lb (99.3 kg)    Body mass index is 27.82 kg/m.   Constitutional: NAD, calm, comfortable, pleasant Abdomen: no tenderness, no masses palpated. No hepatosplenomegaly. Bowel sounds positive.  Musculoskeletal: no clubbing / cyanosis. No joint deformity upper and lower extremities. Good ROM, no contractures. Normal muscle tone.  Neurologic: Grossly intact and nonfocal Psychiatric: Normal judgment and insight. Alert and oriented x 3. Normal mood.    Impression and Plan:  Dysuria - Plan: POCT urinalysis dipstick, Urinalysis with Reflex Microscopic, Culture, Urine -Urine dipstick normal. -Will send for formal UA and culture but will withhold antibiotic therapy for now. -Etiology unclear, question from transient rhabdo after heavy workout.     Patient Instructions  -It was nice meeting you today!  -Make sure to drink plenty of water when you exercise, we will let you know  if there are any abnormalities on the urine culture.     Lelon Frohlich, MD [place] [facility]

## 2018-02-22 DIAGNOSIS — M50923 Unspecified cervical disc disorder at C6-C7 level: Secondary | ICD-10-CM | POA: Diagnosis not present

## 2018-02-22 DIAGNOSIS — M5136 Other intervertebral disc degeneration, lumbar region: Secondary | ICD-10-CM | POA: Diagnosis not present

## 2018-02-22 DIAGNOSIS — M25511 Pain in right shoulder: Secondary | ICD-10-CM | POA: Diagnosis not present

## 2018-02-22 DIAGNOSIS — M50922 Unspecified cervical disc disorder at C5-C6 level: Secondary | ICD-10-CM | POA: Diagnosis not present

## 2018-02-23 DIAGNOSIS — M50923 Unspecified cervical disc disorder at C6-C7 level: Secondary | ICD-10-CM | POA: Diagnosis not present

## 2018-02-23 DIAGNOSIS — M5136 Other intervertebral disc degeneration, lumbar region: Secondary | ICD-10-CM | POA: Diagnosis not present

## 2018-02-23 DIAGNOSIS — M50922 Unspecified cervical disc disorder at C5-C6 level: Secondary | ICD-10-CM | POA: Diagnosis not present

## 2018-02-23 DIAGNOSIS — M25511 Pain in right shoulder: Secondary | ICD-10-CM | POA: Diagnosis not present

## 2018-02-26 DIAGNOSIS — M25562 Pain in left knee: Secondary | ICD-10-CM | POA: Insufficient documentation

## 2018-02-26 DIAGNOSIS — M50922 Unspecified cervical disc disorder at C5-C6 level: Secondary | ICD-10-CM | POA: Diagnosis not present

## 2018-02-26 DIAGNOSIS — M7052 Other bursitis of knee, left knee: Secondary | ICD-10-CM | POA: Diagnosis not present

## 2018-02-26 DIAGNOSIS — M50923 Unspecified cervical disc disorder at C6-C7 level: Secondary | ICD-10-CM | POA: Diagnosis not present

## 2018-02-26 DIAGNOSIS — M5136 Other intervertebral disc degeneration, lumbar region: Secondary | ICD-10-CM | POA: Diagnosis not present

## 2018-02-26 DIAGNOSIS — M25511 Pain in right shoulder: Secondary | ICD-10-CM | POA: Diagnosis not present

## 2018-03-01 DIAGNOSIS — M545 Low back pain: Secondary | ICD-10-CM | POA: Diagnosis not present

## 2018-03-03 DIAGNOSIS — M7751 Other enthesopathy of right foot: Secondary | ICD-10-CM | POA: Diagnosis not present

## 2018-03-03 DIAGNOSIS — M25511 Pain in right shoulder: Secondary | ICD-10-CM | POA: Diagnosis not present

## 2018-03-03 DIAGNOSIS — M50923 Unspecified cervical disc disorder at C6-C7 level: Secondary | ICD-10-CM | POA: Diagnosis not present

## 2018-03-03 DIAGNOSIS — M5136 Other intervertebral disc degeneration, lumbar region: Secondary | ICD-10-CM | POA: Diagnosis not present

## 2018-03-03 DIAGNOSIS — G5761 Lesion of plantar nerve, right lower limb: Secondary | ICD-10-CM | POA: Diagnosis not present

## 2018-03-03 DIAGNOSIS — M50922 Unspecified cervical disc disorder at C5-C6 level: Secondary | ICD-10-CM | POA: Diagnosis not present

## 2018-03-08 ENCOUNTER — Ambulatory Visit (INDEPENDENT_AMBULATORY_CARE_PROVIDER_SITE_OTHER): Payer: BLUE CROSS/BLUE SHIELD | Admitting: Family Medicine

## 2018-03-08 ENCOUNTER — Encounter: Payer: Self-pay | Admitting: Family Medicine

## 2018-03-08 ENCOUNTER — Other Ambulatory Visit: Payer: Self-pay

## 2018-03-08 VITALS — BP 114/68 | HR 77 | Temp 98.4°F | Ht 74.0 in | Wt 212.2 lb

## 2018-03-08 DIAGNOSIS — M50922 Unspecified cervical disc disorder at C5-C6 level: Secondary | ICD-10-CM | POA: Diagnosis not present

## 2018-03-08 DIAGNOSIS — Z23 Encounter for immunization: Secondary | ICD-10-CM | POA: Diagnosis not present

## 2018-03-08 DIAGNOSIS — M25511 Pain in right shoulder: Secondary | ICD-10-CM | POA: Diagnosis not present

## 2018-03-08 DIAGNOSIS — M5136 Other intervertebral disc degeneration, lumbar region: Secondary | ICD-10-CM | POA: Diagnosis not present

## 2018-03-08 DIAGNOSIS — M50923 Unspecified cervical disc disorder at C6-C7 level: Secondary | ICD-10-CM | POA: Diagnosis not present

## 2018-03-08 DIAGNOSIS — Z0000 Encounter for general adult medical examination without abnormal findings: Secondary | ICD-10-CM | POA: Diagnosis not present

## 2018-03-08 LAB — CBC WITH DIFFERENTIAL/PLATELET
BASOS ABS: 0 10*3/uL (ref 0.0–0.1)
Basophils Relative: 0.7 % (ref 0.0–3.0)
Eosinophils Absolute: 0.1 10*3/uL (ref 0.0–0.7)
Eosinophils Relative: 1.2 % (ref 0.0–5.0)
HEMATOCRIT: 39.6 % (ref 39.0–52.0)
Hemoglobin: 13.4 g/dL (ref 13.0–17.0)
LYMPHS PCT: 34.7 % (ref 12.0–46.0)
Lymphs Abs: 1.9 10*3/uL (ref 0.7–4.0)
MCHC: 33.8 g/dL (ref 30.0–36.0)
MCV: 92.7 fl (ref 78.0–100.0)
MONOS PCT: 6.7 % (ref 3.0–12.0)
Monocytes Absolute: 0.4 10*3/uL (ref 0.1–1.0)
NEUTROS ABS: 3.2 10*3/uL (ref 1.4–7.7)
Neutrophils Relative %: 56.7 % (ref 43.0–77.0)
PLATELETS: 157 10*3/uL (ref 150.0–400.0)
RBC: 4.27 Mil/uL (ref 4.22–5.81)
RDW: 13.2 % (ref 11.5–15.5)
WBC: 5.6 10*3/uL (ref 4.0–10.5)

## 2018-03-08 LAB — BASIC METABOLIC PANEL
BUN: 18 mg/dL (ref 6–23)
CALCIUM: 9.5 mg/dL (ref 8.4–10.5)
CO2: 31 meq/L (ref 19–32)
CREATININE: 1.01 mg/dL (ref 0.40–1.50)
Chloride: 103 mEq/L (ref 96–112)
GFR: 79.34 mL/min (ref >60.00)
GLUCOSE: 114 mg/dL — AB (ref 70–99)
Potassium: 4.6 mEq/L (ref 3.5–5.1)
Sodium: 141 mEq/L (ref 135–145)

## 2018-03-08 LAB — HEPATIC FUNCTION PANEL
ALT: 18 U/L (ref 0–53)
AST: 16 U/L (ref 0–37)
Albumin: 4.7 g/dL (ref 3.5–5.2)
Alkaline Phosphatase: 57 U/L (ref 39–117)
Bilirubin, Direct: 0.1 mg/dL (ref 0.0–0.3)
Total Bilirubin: 0.5 mg/dL (ref 0.2–1.2)
Total Protein: 6.8 g/dL (ref 6.0–8.3)

## 2018-03-08 LAB — LIPID PANEL
CHOL/HDL RATIO: 3
Cholesterol: 139 mg/dL (ref 0–200)
HDL: 46.3 mg/dL (ref >39.00)
LDL CALC: 69 mg/dL (ref 0–99)
NONHDL: 92.95
TRIGLYCERIDES: 118 mg/dL (ref 0.0–149.0)
VLDL: 23.6 mg/dL (ref 0.0–40.0)

## 2018-03-08 LAB — PSA: PSA: 1.31 ng/mL (ref 0.10–4.00)

## 2018-03-08 LAB — HEMOGLOBIN A1C: Hgb A1c MFr Bld: 6.1 % (ref 4.6–6.5)

## 2018-03-08 LAB — TSH: TSH: 1.24 u[IU]/mL (ref 0.35–4.50)

## 2018-03-08 NOTE — Progress Notes (Signed)
Subjective:     Patient ID: Peter Snyder, male   DOB: 08-18-1955, 62 y.o.   MRN: [id number]  HPI Patient is here for physical exam.  He has multiple chronic medical problems.  We reviewed several health maintenance issues as follows  -Flu vaccine given this past September -Hepatitis C screen negative last year -Tetanus 2015 -Colonoscopy 01/06/2017 with recommended 3-year follow-up -Patient had one Shingrix vaccine last year but never got a second  He walks most days of the week for exercise.  No recent chest pains.  He is followed regularly by cardiology.  He has systolic dysfunction but not impairing his day-to-day activity such as walking.  Past Medical History:  Diagnosis Date  . Arthritis   . CAD (coronary artery disease) 6/14   Cardiac catheterization 6/27/ 2014 ejection fraction 35-40%, 30% proximal left circumflex, 100% tiny obtuse marginal 1 with collaterals, 50% LAD, 50% D1, 100% RCA with collaterals  . Chicken pox   . Colon polyps   . Depression   . Diabetes mellitus ([place])    Diet controlled  . GERD (gastroesophageal reflux disease)   . Hyperlipidemia   . Hypertension   . Kidney stone    Past Surgical History:  Procedure Laterality Date  . TONSILLECTOMY  1963  . TRIGGER FINGER RELEASE  2019    reports that he quit smoking about 15 years ago. He has never used smokeless tobacco. He reports that he does not drink alcohol or use drugs. family history includes Arthritis in his father and paternal grandmother; Clotting disorder in his paternal grandfather and paternal grandmother; Colon polyps in his father; Heart disease in his maternal grandmother; Hyperlipidemia in his maternal grandmother; Liver cancer in his paternal grandfather; Melanoma in his mother. Allergies  Allergen Reactions  . Latex Other (See Comments)    Other  . Sulfa Antibiotics Rash  '   Review of Systems  Constitutional: Negative for appetite change, fatigue and unexpected weight change.  Eyes:  Negative for visual disturbance.  Respiratory: Negative for cough, chest tightness and shortness of breath.   Cardiovascular: Negative for chest pain, palpitations and leg swelling.  Gastrointestinal: Negative for abdominal pain.  Endocrine: Negative for polydipsia and polyuria.  Genitourinary: Negative for dysuria.  Neurological: Negative for dizziness, syncope, weakness, light-headedness and headaches.       Objective:   Physical Exam Constitutional:      General: He is not in acute distress.    Appearance: He is well-developed.  HENT:     Head: Normocephalic and atraumatic.     Right Ear: External ear normal.     Left Ear: External ear normal.  Eyes:     Conjunctiva/sclera: Conjunctivae normal.     Pupils: Pupils are equal, round, and reactive to light.  Neck:     Musculoskeletal: Normal range of motion and neck supple.     Thyroid: No thyromegaly.  Cardiovascular:     Rate and Rhythm: Normal rate and regular rhythm.     Heart sounds: Normal heart sounds. No murmur.  Pulmonary:     Effort: No respiratory distress.     Breath sounds: No wheezing or rales.  Abdominal:     General: Bowel sounds are normal. There is no distension.     Palpations: Abdomen is soft. There is no mass.     Tenderness: There is no abdominal tenderness. There is no guarding or rebound.  Lymphadenopathy:     Cervical: No cervical adenopathy.  Skin:    Findings: No rash.  Neurological:     Mental Status: He is alert and oriented to person, place, and time.     Cranial Nerves: No cranial nerve deficit.     Deep Tendon Reflexes: Reflexes normal.        Assessment:     Physical exam.  The following health maintenance issues were addressed    Plan:     -Obtain follow-up labs.  We discussed pros and cons of PSA testing and he would like to proceed.  We will also check A1c with past history of prediabetes range blood sugars -Complete Shingrix vaccine series -Flu vaccine already given -Continue  regular exercise habits  [name] MD [facility] at [facility]

## 2018-03-08 NOTE — Addendum Note (Signed)
Addended by: Anibal Henderson on: 03/08/2018 10:23 AM   Modules accepted: Orders

## 2018-03-15 DIAGNOSIS — M25511 Pain in right shoulder: Secondary | ICD-10-CM | POA: Diagnosis not present

## 2018-03-15 DIAGNOSIS — M50922 Unspecified cervical disc disorder at C5-C6 level: Secondary | ICD-10-CM | POA: Diagnosis not present

## 2018-03-15 DIAGNOSIS — M50923 Unspecified cervical disc disorder at C6-C7 level: Secondary | ICD-10-CM | POA: Diagnosis not present

## 2018-03-15 DIAGNOSIS — M5136 Other intervertebral disc degeneration, lumbar region: Secondary | ICD-10-CM | POA: Diagnosis not present

## 2018-03-18 DIAGNOSIS — M50922 Unspecified cervical disc disorder at C5-C6 level: Secondary | ICD-10-CM | POA: Diagnosis not present

## 2018-03-18 DIAGNOSIS — M5136 Other intervertebral disc degeneration, lumbar region: Secondary | ICD-10-CM | POA: Diagnosis not present

## 2018-03-18 DIAGNOSIS — M50923 Unspecified cervical disc disorder at C6-C7 level: Secondary | ICD-10-CM | POA: Diagnosis not present

## 2018-03-18 DIAGNOSIS — M25511 Pain in right shoulder: Secondary | ICD-10-CM | POA: Diagnosis not present

## 2018-03-22 DIAGNOSIS — M25511 Pain in right shoulder: Secondary | ICD-10-CM | POA: Diagnosis not present

## 2018-03-22 DIAGNOSIS — M50922 Unspecified cervical disc disorder at C5-C6 level: Secondary | ICD-10-CM | POA: Diagnosis not present

## 2018-03-22 DIAGNOSIS — M5136 Other intervertebral disc degeneration, lumbar region: Secondary | ICD-10-CM | POA: Diagnosis not present

## 2018-03-22 DIAGNOSIS — M50923 Unspecified cervical disc disorder at C6-C7 level: Secondary | ICD-10-CM | POA: Diagnosis not present

## 2018-03-23 DIAGNOSIS — G894 Chronic pain syndrome: Secondary | ICD-10-CM | POA: Diagnosis not present

## 2018-03-23 DIAGNOSIS — M542 Cervicalgia: Secondary | ICD-10-CM | POA: Diagnosis not present

## 2018-03-23 DIAGNOSIS — Z713 Dietary counseling and surveillance: Secondary | ICD-10-CM | POA: Diagnosis not present

## 2018-03-23 DIAGNOSIS — M545 Low back pain: Secondary | ICD-10-CM | POA: Diagnosis not present

## 2018-03-24 ENCOUNTER — Encounter: Payer: Self-pay | Admitting: Family Medicine

## 2018-03-24 ENCOUNTER — Ambulatory Visit (INDEPENDENT_AMBULATORY_CARE_PROVIDER_SITE_OTHER): Payer: BLUE CROSS/BLUE SHIELD | Admitting: Family Medicine

## 2018-03-24 VITALS — BP 120/70 | HR 72 | Temp 98.3°F | Wt 221.1 lb

## 2018-03-24 DIAGNOSIS — M25511 Pain in right shoulder: Secondary | ICD-10-CM | POA: Diagnosis not present

## 2018-03-24 DIAGNOSIS — J209 Acute bronchitis, unspecified: Secondary | ICD-10-CM | POA: Diagnosis not present

## 2018-03-24 DIAGNOSIS — M50923 Unspecified cervical disc disorder at C6-C7 level: Secondary | ICD-10-CM | POA: Diagnosis not present

## 2018-03-24 DIAGNOSIS — M5136 Other intervertebral disc degeneration, lumbar region: Secondary | ICD-10-CM | POA: Diagnosis not present

## 2018-03-24 DIAGNOSIS — M50922 Unspecified cervical disc disorder at C5-C6 level: Secondary | ICD-10-CM | POA: Diagnosis not present

## 2018-03-24 MED ORDER — BENZONATATE 200 MG PO CAPS: 200.0000 mg | ORAL_CAPSULE | Freq: Two times a day (BID) | ORAL | 1 refills | Status: DC | PRN

## 2018-03-24 MED ORDER — AZITHROMYCIN 250 MG PO TABS: ORAL_TABLET | ORAL | 0 refills | Status: DC

## 2018-03-24 NOTE — Progress Notes (Signed)
   Subjective:    Patient ID: Peter Snyder, male    DOB: 1956/01/20, 63 y.o.   MRN: [id number]  HPI Here for 4 days of stuffy head, PND, chest congestion and coughing up green sputum. No fever.    Review of Systems  Constitutional: Negative.   HENT: Positive for congestion and postnasal drip. Negative for sinus pressure, sinus pain and sore throat.   Eyes: Negative.   Respiratory: Positive for cough and chest tightness.        Objective:   Physical Exam Constitutional:      Appearance: Normal appearance.  HENT:     Right Ear: Tympanic membrane and ear canal normal.     Left Ear: Tympanic membrane and ear canal normal.     Nose: Nose normal.     Mouth/Throat:     Pharynx: Oropharynx is clear.  Eyes:     Conjunctiva/sclera: Conjunctivae normal.  Pulmonary:     Effort: Pulmonary effort is normal. No respiratory distress.     Breath sounds: No wheezing or rales.     Comments: Scattered rhonchi  Neurological:     Mental Status: He is alert.           Assessment & Plan:  Bronchitis, treat with a Zpack.  Alysia Penna, MD

## 2018-03-29 DIAGNOSIS — M50923 Unspecified cervical disc disorder at C6-C7 level: Secondary | ICD-10-CM | POA: Diagnosis not present

## 2018-03-29 DIAGNOSIS — M5136 Other intervertebral disc degeneration, lumbar region: Secondary | ICD-10-CM | POA: Diagnosis not present

## 2018-03-29 DIAGNOSIS — M50922 Unspecified cervical disc disorder at C5-C6 level: Secondary | ICD-10-CM | POA: Diagnosis not present

## 2018-03-29 DIAGNOSIS — M25511 Pain in right shoulder: Secondary | ICD-10-CM | POA: Diagnosis not present

## 2018-03-30 ENCOUNTER — Other Ambulatory Visit: Payer: Self-pay | Admitting: Cardiology

## 2018-03-30 ENCOUNTER — Encounter: Payer: Self-pay | Admitting: Family Medicine

## 2018-03-30 ENCOUNTER — Other Ambulatory Visit: Payer: Self-pay | Admitting: Family Medicine

## 2018-03-30 ENCOUNTER — Ambulatory Visit (INDEPENDENT_AMBULATORY_CARE_PROVIDER_SITE_OTHER): Payer: BLUE CROSS/BLUE SHIELD | Admitting: Family Medicine

## 2018-03-30 ENCOUNTER — Other Ambulatory Visit: Payer: Self-pay

## 2018-03-30 VITALS — BP 124/68 | HR 74 | Temp 98.3°F | Ht 74.0 in | Wt 216.2 lb

## 2018-03-30 DIAGNOSIS — R05 Cough: Secondary | ICD-10-CM | POA: Diagnosis not present

## 2018-03-30 DIAGNOSIS — R059 Cough, unspecified: Secondary | ICD-10-CM

## 2018-03-30 NOTE — Progress Notes (Signed)
  Subjective:     Patient ID: Peter Snyder, male   DOB: 08/19/1955, 63 y.o.   MRN: [id number]  HPI Patient seen for follow-up regarding some recent cough.  Duration of 1 week.  He was seen here and started on Zithromax.  His cough remains dry.  Using Tessalon with fairly good relief.  Has some general fatigue and nasal congestion.  Mild upset stomach.  No vomiting.  Couple episodes of loose stools.  No bloody stools.  Past Medical History:  Diagnosis Date  . Arthritis   . CAD (coronary artery disease) 6/14   Cardiac catheterization 6/27/ 2014 ejection fraction 35-40%, 30% proximal left circumflex, 100% tiny obtuse marginal 1 with collaterals, 50% LAD, 50% D1, 100% RCA with collaterals  . Chicken pox   . Colon polyps   . Depression   . Diabetes mellitus ([place])    Diet controlled  . GERD (gastroesophageal reflux disease)   . Hyperlipidemia   . Hypertension   . Kidney stone    Past Surgical History:  Procedure Laterality Date  . TONSILLECTOMY  1963  . TRIGGER FINGER RELEASE  2019    reports that he quit smoking about 15 years ago. He has never used smokeless tobacco. He reports that he does not drink alcohol or use drugs. family history includes Arthritis in his father and paternal grandmother; Clotting disorder in his paternal grandfather and paternal grandmother; Colon polyps in his father; Heart disease in his maternal grandmother; Hyperlipidemia in his maternal grandmother; Liver cancer in his paternal grandfather; Melanoma in his mother. Allergies  Allergen Reactions  . Latex Other (See Comments)    Other  . Sulfa Antibiotics Rash     Review of Systems  Constitutional: Positive for fatigue. Negative for chills and fever.  HENT: Positive for congestion.   Respiratory: Positive for cough. Negative for shortness of breath and wheezing.        Objective:   Physical Exam Constitutional:      Appearance: Normal appearance.  HENT:     Right Ear: Tympanic membrane normal.   Left Ear: Tympanic membrane normal.  Cardiovascular:     Rate and Rhythm: Normal rate and regular rhythm.  Pulmonary:     Effort: Pulmonary effort is normal.     Breath sounds: Normal breath sounds. No wheezing or rales.  Neurological:     Mental Status: He is alert.        Assessment:     Cough.  Suspect acute viral bronchitis.  Nonfocal exam    Plan:     -We explained that acute bronchitis frequently lasts 3 to 4 weeks.  Observe for now.  No further antibiotics at this point.  Follow-up promptly for any fever or any worsening symptoms  [name] MD [facility]

## 2018-03-30 NOTE — Patient Instructions (Signed)

## 2018-03-31 DIAGNOSIS — F411 Generalized anxiety disorder: Secondary | ICD-10-CM | POA: Diagnosis not present

## 2018-04-07 DIAGNOSIS — M25511 Pain in right shoulder: Secondary | ICD-10-CM | POA: Diagnosis not present

## 2018-04-07 DIAGNOSIS — M5136 Other intervertebral disc degeneration, lumbar region: Secondary | ICD-10-CM | POA: Diagnosis not present

## 2018-04-07 DIAGNOSIS — M50923 Unspecified cervical disc disorder at C6-C7 level: Secondary | ICD-10-CM | POA: Diagnosis not present

## 2018-04-07 DIAGNOSIS — M50922 Unspecified cervical disc disorder at C5-C6 level: Secondary | ICD-10-CM | POA: Diagnosis not present

## 2018-04-09 DIAGNOSIS — M5136 Other intervertebral disc degeneration, lumbar region: Secondary | ICD-10-CM | POA: Diagnosis not present

## 2018-04-09 DIAGNOSIS — M5030 Other cervical disc degeneration, unspecified cervical region: Secondary | ICD-10-CM | POA: Diagnosis not present

## 2018-04-09 DIAGNOSIS — M961 Postlaminectomy syndrome, not elsewhere classified: Secondary | ICD-10-CM | POA: Diagnosis not present

## 2018-04-13 DIAGNOSIS — M50922 Unspecified cervical disc disorder at C5-C6 level: Secondary | ICD-10-CM | POA: Diagnosis not present

## 2018-04-13 DIAGNOSIS — M25511 Pain in right shoulder: Secondary | ICD-10-CM | POA: Diagnosis not present

## 2018-04-13 DIAGNOSIS — L309 Dermatitis, unspecified: Secondary | ICD-10-CM | POA: Diagnosis not present

## 2018-04-13 DIAGNOSIS — M50923 Unspecified cervical disc disorder at C6-C7 level: Secondary | ICD-10-CM | POA: Diagnosis not present

## 2018-04-13 DIAGNOSIS — M5136 Other intervertebral disc degeneration, lumbar region: Secondary | ICD-10-CM | POA: Diagnosis not present

## 2018-04-13 DIAGNOSIS — L820 Inflamed seborrheic keratosis: Secondary | ICD-10-CM | POA: Diagnosis not present

## 2018-04-13 DIAGNOSIS — L570 Actinic keratosis: Secondary | ICD-10-CM | POA: Diagnosis not present

## 2018-04-19 DIAGNOSIS — M50922 Unspecified cervical disc disorder at C5-C6 level: Secondary | ICD-10-CM | POA: Diagnosis not present

## 2018-04-19 DIAGNOSIS — M5136 Other intervertebral disc degeneration, lumbar region: Secondary | ICD-10-CM | POA: Diagnosis not present

## 2018-04-19 DIAGNOSIS — M25511 Pain in right shoulder: Secondary | ICD-10-CM | POA: Diagnosis not present

## 2018-04-19 DIAGNOSIS — M50923 Unspecified cervical disc disorder at C6-C7 level: Secondary | ICD-10-CM | POA: Diagnosis not present

## 2018-04-21 DIAGNOSIS — M5136 Other intervertebral disc degeneration, lumbar region: Secondary | ICD-10-CM | POA: Diagnosis not present

## 2018-04-21 DIAGNOSIS — M50923 Unspecified cervical disc disorder at C6-C7 level: Secondary | ICD-10-CM | POA: Diagnosis not present

## 2018-04-21 DIAGNOSIS — M50922 Unspecified cervical disc disorder at C5-C6 level: Secondary | ICD-10-CM | POA: Diagnosis not present

## 2018-04-21 DIAGNOSIS — M25511 Pain in right shoulder: Secondary | ICD-10-CM | POA: Diagnosis not present

## 2018-04-23 ENCOUNTER — Other Ambulatory Visit: Payer: Self-pay

## 2018-04-23 ENCOUNTER — Encounter: Payer: Self-pay | Admitting: Family Medicine

## 2018-04-23 ENCOUNTER — Ambulatory Visit: Payer: BLUE CROSS/BLUE SHIELD | Admitting: Family Medicine

## 2018-04-23 VITALS — BP 118/70 | HR 59 | Temp 97.6°F | Ht 74.0 in | Wt 218.8 lb

## 2018-04-23 DIAGNOSIS — R05 Cough: Secondary | ICD-10-CM

## 2018-04-23 DIAGNOSIS — R059 Cough, unspecified: Secondary | ICD-10-CM

## 2018-04-23 NOTE — Patient Instructions (Signed)
Cough, Adult  Coughing is a reflex that clears your throat and your airways. Coughing helps to heal and protect your lungs. It is normal to cough occasionally, but a cough that happens with other symptoms or lasts a long time may be a sign of a condition that needs treatment. A cough may last only 2-3 weeks (acute), or it may last longer than 8 weeks (chronic). What are the causes? Coughing is commonly caused by:  Breathing in substances that irritate your lungs.  A viral or bacterial respiratory infection.  Allergies.  Asthma.  Postnasal drip.  Smoking.  Acid backing up from the stomach into the esophagus (gastroesophageal reflux).  Certain medicines.  Chronic lung problems, including COPD (or rarely, lung cancer).  Other medical conditions such as heart failure. Follow these instructions at home: Pay attention to any changes in your symptoms. Take these actions to help with your discomfort:  Take medicines only as told by your health care provider. ? If you were prescribed an antibiotic medicine, take it as told by your health care provider. Do not stop taking the antibiotic even if you start to feel better. ? Talk with your health care provider before you take a cough suppressant medicine.  Drink enough fluid to keep your urine clear or pale yellow.  If the air is dry, use a cold steam vaporizer or humidifier in your bedroom or your home to help loosen secretions.  Avoid anything that causes you to cough at work or at home.  If your cough is worse at night, try sleeping in a semi-upright position.  Avoid cigarette smoke. If you smoke, quit smoking. If you need help quitting, ask your health care provider.  Avoid caffeine.  Avoid alcohol.  Rest as needed. Contact a health care provider if:  You have new symptoms.  You cough up pus.  Your cough does not get better after 2-3 weeks, or your cough gets worse.  You cannot control your cough with suppressant  medicines and you are losing sleep.  You develop pain that is getting worse or pain that is not controlled with pain medicines.  You have a fever.  You have unexplained weight loss.  You have night sweats. Get help right away if:  You cough up blood.  You have difficulty breathing.  Your heartbeat is very fast. This information is not intended to replace advice given to you by your health care provider. Make sure you discuss any questions you have with your health care provider. Document Released: 08/30/2010 Document Revised: 08/09/2015 Document Reviewed: 05/10/2014 Elsevier Interactive Patient Education  2019 Elsevier Inc.  

## 2018-04-23 NOTE — Progress Notes (Signed)
  Subjective:     Patient ID: Peter Snyder, male   DOB: 02-22-1956, 63 y.o.   MRN: [id number]  HPI Patient is a non-smoker who was seen with some persistent cough.  Onset about a month ago.  He started with typical URI type symptoms.  He still has some nasal congestion but other symptoms improved.  No fevers or chills.  No dyspnea.  Still exercise with treadmill most days of the week.  Has had some recent weight gain which he attributes to eating poorly over the holidays.  No peripheral edema.  No orthopnea.  Recent TSH in December was normal  Past Medical History:  Diagnosis Date  . Arthritis   . CAD (coronary artery disease) 6/14   Cardiac catheterization 6/27/ 2014 ejection fraction 35-40%, 30% proximal left circumflex, 100% tiny obtuse marginal 1 with collaterals, 50% LAD, 50% D1, 100% RCA with collaterals  . Chicken pox   . Colon polyps   . Depression   . Diabetes mellitus ([place])    Diet controlled  . GERD (gastroesophageal reflux disease)   . Hyperlipidemia   . Hypertension   . Kidney stone    Past Surgical History:  Procedure Laterality Date  . TONSILLECTOMY  1963  . TRIGGER FINGER RELEASE  2019    reports that he quit smoking about 15 years ago. He has never used smokeless tobacco. He reports that he does not drink alcohol or use drugs. family history includes Arthritis in his father and paternal grandmother; Clotting disorder in his paternal grandfather and paternal grandmother; Colon polyps in his father; Heart disease in his maternal grandmother; Hyperlipidemia in his maternal grandmother; Liver cancer in his paternal grandfather; Melanoma in his mother. Allergies  Allergen Reactions  . Latex Other (See Comments)    Other  . Sulfa Antibiotics Rash     Review of Systems  Constitutional: Negative for appetite change, chills and fever.  Respiratory: Positive for cough. Negative for shortness of breath and wheezing.   Cardiovascular: Negative for chest pain and leg  swelling.  Endocrine: Negative for cold intolerance.       Objective:   Physical Exam Constitutional:      Appearance: Normal appearance.  Neck:     Musculoskeletal: Neck supple.  Cardiovascular:     Rate and Rhythm: Normal rate and regular rhythm.  Pulmonary:     Effort: Pulmonary effort is normal.     Breath sounds: Normal breath sounds. No wheezing or rales.  Musculoskeletal:     Right lower leg: No edema.     Left lower leg: No edema.  Neurological:     Mental Status: He is alert.        Assessment:     Cough.  Suspect related to recent viral URI.  Nonfocal exam    Plan:     -We recommend against another couple weeks.  Suspect will resolve with time.  Follow-up for any dyspnea, fever, or other concerns  [name] MD  Primary Care at [facility]

## 2018-04-26 DIAGNOSIS — M50923 Unspecified cervical disc disorder at C6-C7 level: Secondary | ICD-10-CM | POA: Diagnosis not present

## 2018-04-26 DIAGNOSIS — M50922 Unspecified cervical disc disorder at C5-C6 level: Secondary | ICD-10-CM | POA: Diagnosis not present

## 2018-04-26 DIAGNOSIS — M25511 Pain in right shoulder: Secondary | ICD-10-CM | POA: Diagnosis not present

## 2018-04-26 DIAGNOSIS — M5136 Other intervertebral disc degeneration, lumbar region: Secondary | ICD-10-CM | POA: Diagnosis not present

## 2018-04-27 DIAGNOSIS — L570 Actinic keratosis: Secondary | ICD-10-CM | POA: Diagnosis not present

## 2018-05-03 DIAGNOSIS — M50922 Unspecified cervical disc disorder at C5-C6 level: Secondary | ICD-10-CM | POA: Diagnosis not present

## 2018-05-03 DIAGNOSIS — M25511 Pain in right shoulder: Secondary | ICD-10-CM | POA: Diagnosis not present

## 2018-05-03 DIAGNOSIS — M5136 Other intervertebral disc degeneration, lumbar region: Secondary | ICD-10-CM | POA: Diagnosis not present

## 2018-05-03 DIAGNOSIS — M50923 Unspecified cervical disc disorder at C6-C7 level: Secondary | ICD-10-CM | POA: Diagnosis not present

## 2018-05-05 DIAGNOSIS — M25511 Pain in right shoulder: Secondary | ICD-10-CM | POA: Diagnosis not present

## 2018-05-05 DIAGNOSIS — M50923 Unspecified cervical disc disorder at C6-C7 level: Secondary | ICD-10-CM | POA: Diagnosis not present

## 2018-05-05 DIAGNOSIS — M50922 Unspecified cervical disc disorder at C5-C6 level: Secondary | ICD-10-CM | POA: Diagnosis not present

## 2018-05-05 DIAGNOSIS — M5136 Other intervertebral disc degeneration, lumbar region: Secondary | ICD-10-CM | POA: Diagnosis not present

## 2018-05-05 DIAGNOSIS — F411 Generalized anxiety disorder: Secondary | ICD-10-CM | POA: Diagnosis not present

## 2018-05-06 DIAGNOSIS — M791 Myalgia, unspecified site: Secondary | ICD-10-CM | POA: Diagnosis not present

## 2018-05-10 DIAGNOSIS — M50922 Unspecified cervical disc disorder at C5-C6 level: Secondary | ICD-10-CM | POA: Diagnosis not present

## 2018-05-10 DIAGNOSIS — Z713 Dietary counseling and surveillance: Secondary | ICD-10-CM | POA: Diagnosis not present

## 2018-05-10 DIAGNOSIS — M50923 Unspecified cervical disc disorder at C6-C7 level: Secondary | ICD-10-CM | POA: Diagnosis not present

## 2018-05-10 DIAGNOSIS — M25511 Pain in right shoulder: Secondary | ICD-10-CM | POA: Diagnosis not present

## 2018-05-10 DIAGNOSIS — M5136 Other intervertebral disc degeneration, lumbar region: Secondary | ICD-10-CM | POA: Diagnosis not present

## 2018-05-11 ENCOUNTER — Encounter: Payer: Self-pay | Admitting: Family Medicine

## 2018-05-11 ENCOUNTER — Ambulatory Visit (INDEPENDENT_AMBULATORY_CARE_PROVIDER_SITE_OTHER): Payer: BLUE CROSS/BLUE SHIELD | Admitting: Family Medicine

## 2018-05-11 VITALS — BP 104/60 | HR 59 | Temp 98.3°F | Wt 222.4 lb

## 2018-05-11 DIAGNOSIS — G5761 Lesion of plantar nerve, right lower limb: Secondary | ICD-10-CM | POA: Diagnosis not present

## 2018-05-11 DIAGNOSIS — B029 Zoster without complications: Secondary | ICD-10-CM

## 2018-05-11 DIAGNOSIS — M7751 Other enthesopathy of right foot: Secondary | ICD-10-CM | POA: Diagnosis not present

## 2018-05-11 MED ORDER — VALACYCLOVIR HCL 1 G PO TABS: 1000.0000 mg | ORAL_TABLET | Freq: Three times a day (TID) | ORAL | 0 refills | Status: DC

## 2018-05-11 NOTE — Progress Notes (Signed)
   Subjective:    Patient ID: Peter Snyder, male    DOB: 03-31-1955, 63 y.o.   MRN: [id number]  HPI Here for 2 days of a rash on the left buttock which itches and burns. He had shingles about 15 years ago.    Review of Systems  Constitutional: Negative.   Respiratory: Negative.   Cardiovascular: Negative.   Skin: Positive for rash.       Objective:   Physical Exam Constitutional:      Appearance: Normal appearance.  Cardiovascular:     Rate and Rhythm: Normal rate and regular rhythm.     Pulses: Normal pulses.     Heart sounds: Normal heart sounds.  Pulmonary:     Effort: Pulmonary effort is normal.     Breath sounds: Normal breath sounds.  Skin:    Comments: Cluster of red papules on the left buttock   Neurological:     Mental Status: He is alert.           Assessment & Plan:  Shingles, treat with Valtrex for 10 days. Alysia Penna, MD

## 2018-05-13 ENCOUNTER — Ambulatory Visit (INDEPENDENT_AMBULATORY_CARE_PROVIDER_SITE_OTHER): Payer: BLUE CROSS/BLUE SHIELD | Admitting: Internal Medicine

## 2018-05-13 ENCOUNTER — Encounter: Payer: Self-pay | Admitting: Internal Medicine

## 2018-05-13 VITALS — BP 130/64 | HR 77 | Temp 97.8°F | Wt 217.6 lb

## 2018-05-13 DIAGNOSIS — R21 Rash and other nonspecific skin eruption: Secondary | ICD-10-CM

## 2018-05-13 DIAGNOSIS — B029 Zoster without complications: Secondary | ICD-10-CM

## 2018-05-13 DIAGNOSIS — F411 Generalized anxiety disorder: Secondary | ICD-10-CM | POA: Diagnosis not present

## 2018-05-13 NOTE — Progress Notes (Signed)
Chief Complaint  Patient presents with  . Rash    pt states that the red spots that is believed to be shingles is on his left hip and is spreading and not getting better.    HPI: [facility] 63 y.[facility]. come in forSDA PCP Dr B  appt NA  Saw dr Sarajane Jews 2 days ago for same problem .now just wants to be sure that it could be something else. Started a number of days ago with itchy slightly tender bumps left buttocks side area has a few more no associated fever.  Dr. Sarajane Jews felt it was probably shingles and placed on Valtrex appropriate dose. Since that time no dramatic spreading the area on his left back is tender a bit but no neurologic symptoms new rashes pustules fever. He has had some recent serious stress that could be a trigger  Hx shingels Shingrix vaccine completed in December.  ROS: See pertinent positives and negatives per HPI.  Past Medical History:  Diagnosis Date  . Arthritis   . CAD (coronary artery disease) 6/14   Cardiac catheterization 6/27/ 2014 ejection fraction 35-40%, 30% proximal left circumflex, 100% tiny obtuse marginal 1 with collaterals, 50% LAD, 50% D1, 100% RCA with collaterals  . Chicken pox   . Colon polyps   . Depression   . Diabetes mellitus ([place])    Diet controlled  . GERD (gastroesophageal reflux disease)   . Hyperlipidemia   . Hypertension   . Kidney stone     Family History  Problem Relation Age of Onset  . Melanoma Mother        metastatic  . Arthritis Father   . Colon polyps Father   . Hyperlipidemia Maternal Grandmother   . Heart disease Maternal Grandmother   . Arthritis Paternal Grandmother   . Clotting disorder Paternal Grandmother   . Clotting disorder Paternal Grandfather   . Liver cancer Paternal Grandfather   . Colon cancer Neg Hx   . Esophageal cancer Neg Hx   . Rectal cancer Neg Hx     Social History   Socioeconomic History  . Marital status: Married    Spouse name: Not on file  . Number of children: 3  . Years of education:  Not on file  . Highest education level: Not on file  Occupational History  . Occupation: retired    Comment: Retired  Scientific laboratory technician  . Financial resource strain: Not on file  . Food insecurity:    Worry: Not on file    Inability: Not on file  . Transportation needs:    Medical: Not on file    Non-medical: Not on file  Tobacco Use  . Smoking status: Former Smoker    Last attempt to quit: 09/15/2002    Years since quitting: 15.6  . Smokeless tobacco: Never Used  . Tobacco comment: quit 15 years ago -socially  Substance and Sexual Activity  . Alcohol use: No    Alcohol/week: 0.0 standard drinks  . Drug use: No  . Sexual activity: Not on file  Lifestyle  . Physical activity:    Days per week: Not on file    Minutes per session: Not on file  . Stress: Not on file  Relationships  . Social connections:    Talks on phone: Not on file    Gets together: Not on file    Attends religious service: Not on file    Active member of club or organization: Not on file    Attends  meetings of clubs or organizations: Not on file    Relationship status: Not on file  Other Topics Concern  . Not on file  Social History Narrative  . Not on file    Outpatient Medications Prior to Visit  Medication Sig Dispense Refill  . acetaminophen (TYLENOL) 500 MG tablet Take 500 mg by mouth every 6 (six) hours as needed for moderate pain.    Marland Kitchen ALPRAZolam (XANAX) 0.25 MG tablet Take 0.25 mg by mouth daily.     Marland Kitchen aspirin 81 MG tablet Take 81 mg by mouth daily.    Marland Kitchen atorvastatin (LIPITOR) 80 MG tablet TAKE 1 TABLET BY MOUTH EVERY DAY 90 tablet 1  . azelastine (ASTELIN) 0.1 % nasal spray Place 1 spray into both nostrils 2 (two) times daily. Use in each nostril as directed 30 mL 12  . benzonatate (TESSALON) 200 MG capsule Take 1 capsule (200 mg total) by mouth 2 (two) times daily as needed for cough. 60 capsule 1  . Bismuth Tribromoph-Petrolatum (XEROFORM PETROLAT PATCH 4"X4") PADS USE AS DIRECTED TWICE A DAY  EXTERNALLY 30 DAYS  4  . carvedilol (COREG) 12.5 MG tablet TAKE 1 TABLET (12.5 MG TOTAL) BY MOUTH 2 (TWO) TIMES DAILY WITH A MEAL. 180 tablet 2  . celecoxib (CELEBREX) 200 MG capsule Take 200 mg by mouth daily.     . clonazePAM (KLONOPIN) 0.5 MG tablet Take 0.5 mg by mouth at bedtime.     . Dermatological Products, Misc. (LEVICYN) GEL Apply 1 application topically 2 (two) times daily after a meal.  2  . Dermatological Products, Misc. (TETRIX) CREA Apply topically See admin instructions.  3  . dexamethasone 0.5 MG/5ML elixir 2-3 tsps swish and spit QID prn. 237 mL 2  . ELIDEL 1 % cream APPLY TO AFFECTED AREA TWICE A DAY AS NEEDED FOR 14 DAYS  2  . escitalopram (LEXAPRO) 20 MG tablet Take 20 mg by mouth daily.    Marland Kitchen ezetimibe (ZETIA) 10 MG tablet TAKE 1 TABLET (10 MG TOTAL) BY MOUTH DAILY. 90 tablet 1  . fluticasone (FLONASE) 50 MCG/ACT nasal spray INHALE 2 SPRAYS IN EACH NOSTRIL EVERY DAY 48 g 2  . gabapentin (NEURONTIN) 600 MG tablet TAKE 1 TABLET BY MOUTH TWICE A DAY 180 tablet 1  . lisinopril (PRINIVIL,ZESTRIL) 20 MG tablet Take 20 mg by mouth daily.    Marland Kitchen LOTEMAX 0.5 % GEL     . pantoprazole (PROTONIX) 40 MG tablet TAKE 1 TABLET BY MOUTH EVERY DAY 90 tablet 3  . RESTASIS 0.05 % ophthalmic emulsion Place 1 drop into both eyes 2 (two) times daily.     . tacrolimus (PROTOPIC) 0.1 % ointment     . tamsulosin (FLOMAX) 0.4 MG CAPS capsule TAKE 1 CAPSULE (0.4 MG TOTAL) BY MOUTH DAILY AFTER SUPPER. 90 capsule 1  . triamcinolone cream (KENALOG) 0.1 % Apply 1 application topically 2 (two) times daily as needed. 30 g 1  . urea (CARMOL) 10 % cream APPLY TO AFFECTED AREA TWICE A DAY AS NEEDED  3  . Urea 40 % LOTN Apply 1 application topically 2 (two) times a week.  3  . valACYclovir (VALTREX) 1000 MG tablet Take 1 tablet (1,000 mg total) by mouth 3 (three) times daily. 30 tablet 0  . VOLTAREN 1 % GEL Apply 2 g topically daily.     Marland Kitchen zolpidem (AMBIEN) 10 MG tablet Take 10 mg by mouth at bedtime as needed  for sleep.     No facility-administered medications  prior to visit.      EXAM:  BP 130/64 (BP Location: Right Arm, Patient Position: Sitting, Cuff Size: Large)   Pulse 77   Temp 97.8 F (36.6 C) (Oral)   Wt 217 lb 9.6 oz (98.7 kg)   SpO2 99%   BMI 27.94 kg/m   Body mass index is 27.94 kg/m.  GENERAL: vitals reviewed and listed above, alert, oriented, appears well hydrated and in no acute distress SKIN back a number of discrete dots that look like bug bites without vesicle left hip buttocks area 8-10 lesions without significant dysesthesia and no swelling.  PSYCH: pleasant and cooperative, no obvious depression or anxiety  BP Readings from Last 3 Encounters:  05/13/18 130/64  05/11/18 104/60  04/23/18 118/70    ASSESSMENT AND PLAN:  Discussed the following assessment and plan:  Herpes zoster without complication probable  - had shingrix vaccine and seems quite mild no vesicle to culture   Rash Rash consider mitigated atypical shingles very mild  and appropriate to be on Valtrex.  However if not shingles does not appear to be from a serious cause or skin infection at this time.  Expectant management patient has a previous medical background will look for signs of alarm and get back with [place]  If needed.    Expectant management.   -Patient advised to return or notify health care team  if  new concerns arise. Disc with patient   There are no Patient Instructions on file for this visit.   Standley Brooking. Manas Hickling M.D.

## 2018-05-13 NOTE — Patient Instructions (Signed)
See assessment.

## 2018-05-17 ENCOUNTER — Other Ambulatory Visit: Payer: Self-pay | Admitting: *Deleted

## 2018-05-17 DIAGNOSIS — I712 Thoracic aortic aneurysm, without rupture, unspecified: Secondary | ICD-10-CM

## 2018-05-18 DIAGNOSIS — M25511 Pain in right shoulder: Secondary | ICD-10-CM | POA: Diagnosis not present

## 2018-05-18 DIAGNOSIS — M5136 Other intervertebral disc degeneration, lumbar region: Secondary | ICD-10-CM | POA: Diagnosis not present

## 2018-05-18 DIAGNOSIS — M50923 Unspecified cervical disc disorder at C6-C7 level: Secondary | ICD-10-CM | POA: Diagnosis not present

## 2018-05-18 DIAGNOSIS — M50922 Unspecified cervical disc disorder at C5-C6 level: Secondary | ICD-10-CM | POA: Diagnosis not present

## 2018-05-19 DIAGNOSIS — F411 Generalized anxiety disorder: Secondary | ICD-10-CM | POA: Diagnosis not present

## 2018-05-20 DIAGNOSIS — M50922 Unspecified cervical disc disorder at C5-C6 level: Secondary | ICD-10-CM | POA: Diagnosis not present

## 2018-05-20 DIAGNOSIS — M25511 Pain in right shoulder: Secondary | ICD-10-CM | POA: Diagnosis not present

## 2018-05-20 DIAGNOSIS — M868X6 Other osteomyelitis, lower leg: Secondary | ICD-10-CM | POA: Diagnosis not present

## 2018-05-20 DIAGNOSIS — M7052 Other bursitis of knee, left knee: Secondary | ICD-10-CM | POA: Diagnosis not present

## 2018-05-20 DIAGNOSIS — M5136 Other intervertebral disc degeneration, lumbar region: Secondary | ICD-10-CM | POA: Diagnosis not present

## 2018-05-20 DIAGNOSIS — M25562 Pain in left knee: Secondary | ICD-10-CM | POA: Diagnosis not present

## 2018-05-20 DIAGNOSIS — M50923 Unspecified cervical disc disorder at C6-C7 level: Secondary | ICD-10-CM | POA: Diagnosis not present

## 2018-05-21 DIAGNOSIS — H1045 Other chronic allergic conjunctivitis: Secondary | ICD-10-CM | POA: Diagnosis not present

## 2018-05-25 DIAGNOSIS — M50922 Unspecified cervical disc disorder at C5-C6 level: Secondary | ICD-10-CM | POA: Diagnosis not present

## 2018-05-25 DIAGNOSIS — G5761 Lesion of plantar nerve, right lower limb: Secondary | ICD-10-CM | POA: Diagnosis not present

## 2018-05-25 DIAGNOSIS — M25571 Pain in right ankle and joints of right foot: Secondary | ICD-10-CM | POA: Diagnosis not present

## 2018-05-25 DIAGNOSIS — M25511 Pain in right shoulder: Secondary | ICD-10-CM | POA: Diagnosis not present

## 2018-05-25 DIAGNOSIS — M5136 Other intervertebral disc degeneration, lumbar region: Secondary | ICD-10-CM | POA: Diagnosis not present

## 2018-05-25 DIAGNOSIS — M50923 Unspecified cervical disc disorder at C6-C7 level: Secondary | ICD-10-CM | POA: Diagnosis not present

## 2018-05-27 DIAGNOSIS — M7052 Other bursitis of knee, left knee: Secondary | ICD-10-CM | POA: Diagnosis not present

## 2018-05-27 DIAGNOSIS — M50922 Unspecified cervical disc disorder at C5-C6 level: Secondary | ICD-10-CM | POA: Diagnosis not present

## 2018-05-27 DIAGNOSIS — M25511 Pain in right shoulder: Secondary | ICD-10-CM | POA: Diagnosis not present

## 2018-05-27 DIAGNOSIS — M50923 Unspecified cervical disc disorder at C6-C7 level: Secondary | ICD-10-CM | POA: Diagnosis not present

## 2018-05-27 DIAGNOSIS — M5136 Other intervertebral disc degeneration, lumbar region: Secondary | ICD-10-CM | POA: Diagnosis not present

## 2018-05-27 DIAGNOSIS — M25562 Pain in left knee: Secondary | ICD-10-CM | POA: Diagnosis not present

## 2018-06-01 ENCOUNTER — Ambulatory Visit: Payer: BLUE CROSS/BLUE SHIELD | Admitting: Family Medicine

## 2018-06-01 ENCOUNTER — Encounter: Payer: Self-pay | Admitting: Family Medicine

## 2018-06-01 ENCOUNTER — Other Ambulatory Visit: Payer: Self-pay

## 2018-06-01 VITALS — BP 110/50 | HR 78 | Temp 97.6°F | Wt 220.5 lb

## 2018-06-01 DIAGNOSIS — J069 Acute upper respiratory infection, unspecified: Secondary | ICD-10-CM | POA: Diagnosis not present

## 2018-06-01 DIAGNOSIS — B9789 Other viral agents as the cause of diseases classified elsewhere: Secondary | ICD-10-CM | POA: Diagnosis not present

## 2018-06-01 MED ORDER — BENZONATATE 100 MG PO CAPS: 100.0000 mg | ORAL_CAPSULE | Freq: Three times a day (TID) | ORAL | 0 refills | Status: DC | PRN

## 2018-06-01 NOTE — Progress Notes (Signed)
  Subjective:     Patient ID: Peter Snyder, male   DOB: 1956-01-28, 63 y.o.   MRN: [id number]  HPI Patient is seen with 1 day history of some nasal congestion and cough.  Woke up this morning with some cough.  Mostly nonproductive.  No dyspnea.  No fever.  No chills.  No myalgias.  Denies any recent travels.  No known sick contacts.  Cough is relatively mild at this time.  Has used Tessalon in the past for similar cough and requesting refill.  Past Medical History:  Diagnosis Date  . Arthritis   . CAD (coronary artery disease) 6/14   Cardiac catheterization 6/27/ 2014 ejection fraction 35-40%, 30% proximal left circumflex, 100% tiny obtuse marginal 1 with collaterals, 50% LAD, 50% D1, 100% RCA with collaterals  . Chicken pox   . Colon polyps   . Depression   . Diabetes mellitus ([place])    Diet controlled  . GERD (gastroesophageal reflux disease)   . Hyperlipidemia   . Hypertension   . Kidney stone    Past Surgical History:  Procedure Laterality Date  . TONSILLECTOMY  1963  . TRIGGER FINGER RELEASE  2019    reports that he quit smoking about 15 years ago. He has never used smokeless tobacco. He reports that he does not drink alcohol or use drugs. family history includes Arthritis in his father and paternal grandmother; Clotting disorder in his paternal grandfather and paternal grandmother; Colon polyps in his father; Heart disease in his maternal grandmother; Hyperlipidemia in his maternal grandmother; Liver cancer in his paternal grandfather; Melanoma in his mother. Allergies  Allergen Reactions  . Latex Other (See Comments)    Other  . Sulfa Antibiotics Rash     Review of Systems  Constitutional: Negative for chills and fever.  HENT: Positive for congestion. Negative for sinus pressure and sinus pain.   Respiratory: Positive for cough. Negative for shortness of breath and wheezing.        Objective:   Physical Exam Constitutional:      Appearance: Normal appearance.   HENT:     Right Ear: Tympanic membrane normal.     Left Ear: Tympanic membrane normal.  Cardiovascular:     Rate and Rhythm: Normal rate and regular rhythm.  Pulmonary:     Effort: Pulmonary effort is normal.     Breath sounds: Normal breath sounds. No wheezing or rales.  Neurological:     Mental Status: He is alert.        Assessment:     Probable viral URI with cough.  Patient does not have any fever or dyspnea.  No recent travels.    Plan:     -We recommended observation at this point.  Follow-up promptly for any fever or increased shortness of breath -Refill Tessalon Perles 100 mg 1 every 8 hours as needed for cough  [name] MD  Primary Care at [facility]

## 2018-06-01 NOTE — Patient Instructions (Signed)
Follow up for any fever or increased shortness of breath. 

## 2018-06-03 DIAGNOSIS — M50923 Unspecified cervical disc disorder at C6-C7 level: Secondary | ICD-10-CM | POA: Diagnosis not present

## 2018-06-03 DIAGNOSIS — M5136 Other intervertebral disc degeneration, lumbar region: Secondary | ICD-10-CM | POA: Diagnosis not present

## 2018-06-03 DIAGNOSIS — M50922 Unspecified cervical disc disorder at C5-C6 level: Secondary | ICD-10-CM | POA: Diagnosis not present

## 2018-06-03 DIAGNOSIS — M25511 Pain in right shoulder: Secondary | ICD-10-CM | POA: Diagnosis not present

## 2018-06-07 ENCOUNTER — Ambulatory Visit: Payer: Self-pay

## 2018-06-07 ENCOUNTER — Other Ambulatory Visit: Payer: Self-pay

## 2018-06-07 ENCOUNTER — Ambulatory Visit (INDEPENDENT_AMBULATORY_CARE_PROVIDER_SITE_OTHER): Payer: BLUE CROSS/BLUE SHIELD | Admitting: Family Medicine

## 2018-06-07 DIAGNOSIS — H9201 Otalgia, right ear: Secondary | ICD-10-CM

## 2018-06-07 NOTE — Telephone Encounter (Signed)
Noted  

## 2018-06-07 NOTE — Telephone Encounter (Signed)
Pt. called to report that he has had onset of right earache yesterday.  Also c/o stuffy nose with clear nasal drainage.  Denied fever, cough, shortness of breath, or any other symptoms.  Called FC at PCP office.  Advised to schedule a Web Ex appt. This afternoon with Dr. Elease Hashimoto.  Pt. advised of a 3:45 PM Web Ex appt.  Pt. agrees with plan.  Phone number and email verified.     Reason for Disposition . Earache  (Exceptions: brief ear pain of < 60 minutes duration, earache occurring during air travel  Answer Assessment - Initial Assessment Questions 1. LOCATION: "Which ear is involved?"     Right ear 2. ONSET: "When did the ear start hurting"      06/06/18 3. SEVERITY: "How bad is the pain?"  (Scale 1-10; mild, moderate or severe)   - MILD (1-3): doesn't interfere with normal activities    - MODERATE (4-7): interferes with normal activities or awakens from sleep    - SEVERE (8-10): excruciating pain, unable to do any normal activities      moderate 4. URI SYMPTOMS: " Do you have a runny nose or cough?"     Runny nose; clear 5. FEVER: "Do you have a fever?" If so, ask: "What is your temperature, how was it measured, and when did it start?"     Denied  6. CAUSE: "Have you been swimming recently?", "How often do you use Q-TIPS?", "Have you had any recent air travel or scuba diving?"     Denied  7. OTHER SYMPTOMS: "Do you have any other symptoms?" (e.g., headache, stiff neck, dizziness, vomiting, runny nose, decreased hearing)     Runny nose, right earache 8. PREGNANCY: "Is there any chance you are pregnant?" "When was your last menstrual period?"     N/a  Protocols used: EARACHE-A-AH

## 2018-06-07 NOTE — Progress Notes (Signed)
Patient ID: Peter Snyder, male   DOB: 11-10-1955, 63 y.o.   MRN: [id number] Virtual Visit via Telephone Note  I connected with [facility] on 06/07/18 at  3:45 PM EDT by telephone and verified that I am speaking with the correct person using two identifiers. We are attempting a WebEx visit but he was unable to obtain video capabilities on his end-therefore we converted this to a phone visit   I discussed the limitations, risks, security and privacy concerns of performing an evaluation and management service by telephone and the availability of in person appointments. I also discussed with the patient that there may be a patient responsible charge related to this service. The patient expressed understanding and agreed to proceed.  Location patient: home Location provider: work or home office Participants present for the call: patient, provider Patient did not have a visit in the prior 7 days to address this/these issue(s).   History of Present Illness: Patient had URI type symptoms a little over week ago.  Those symptoms have cleared.  He presents now with some right earache past day.  Mild rhinorrhea.  Denies any cough, fever, dyspnea, fatigue, body aches, dizziness.  No drainage.  No vertigo.  Denies any history of recurrent ear infections   Observations/Objective: Patient sounds cheerful and well on the phone. I do not appreciate any SOB. Speech and thought processing are grossly intact. Patient reported vitals:  Assessment and Plan: Right otalgia.  We explained in adults this most likely is otitis media with effusion but no way to know without looking on exam. -Recommend observation for now -He will try Tylenol as needed for discomfort  Follow Up Instructions:  -Follow-up for any fever, hearing changes, vertigo, or any persistent pain   [id number] [id number] [id number] 11-20 [id number] 21-30 I did not refer this patient for an OV in the next 24 hours for this/these issue(s).  I discussed the  assessment and treatment plan with the patient. The patient was provided an opportunity to ask questions and all were answered. The patient agreed with the plan and demonstrated an understanding of the instructions.   The patient was advised to call back or seek an in-person evaluation if the symptoms worsen or if the condition fails to improve as anticipated.  I provided 11 minutes of non-face-to-face time during this encounter.   Carolann Littler, MD

## 2018-06-09 DIAGNOSIS — M50922 Unspecified cervical disc disorder at C5-C6 level: Secondary | ICD-10-CM | POA: Diagnosis not present

## 2018-06-09 DIAGNOSIS — M5136 Other intervertebral disc degeneration, lumbar region: Secondary | ICD-10-CM | POA: Diagnosis not present

## 2018-06-09 DIAGNOSIS — M50923 Unspecified cervical disc disorder at C6-C7 level: Secondary | ICD-10-CM | POA: Diagnosis not present

## 2018-06-09 DIAGNOSIS — M25511 Pain in right shoulder: Secondary | ICD-10-CM | POA: Diagnosis not present

## 2018-06-10 DIAGNOSIS — M5136 Other intervertebral disc degeneration, lumbar region: Secondary | ICD-10-CM | POA: Diagnosis not present

## 2018-06-10 DIAGNOSIS — M50923 Unspecified cervical disc disorder at C6-C7 level: Secondary | ICD-10-CM | POA: Diagnosis not present

## 2018-06-10 DIAGNOSIS — M25511 Pain in right shoulder: Secondary | ICD-10-CM | POA: Diagnosis not present

## 2018-06-10 DIAGNOSIS — M50922 Unspecified cervical disc disorder at C5-C6 level: Secondary | ICD-10-CM | POA: Diagnosis not present

## 2018-06-11 DIAGNOSIS — M961 Postlaminectomy syndrome, not elsewhere classified: Secondary | ICD-10-CM | POA: Diagnosis not present

## 2018-06-11 DIAGNOSIS — M533 Sacrococcygeal disorders, not elsewhere classified: Secondary | ICD-10-CM | POA: Diagnosis not present

## 2018-06-13 ENCOUNTER — Other Ambulatory Visit: Payer: Self-pay | Admitting: Family Medicine

## 2018-06-15 DIAGNOSIS — M50923 Unspecified cervical disc disorder at C6-C7 level: Secondary | ICD-10-CM | POA: Diagnosis not present

## 2018-06-15 DIAGNOSIS — M5136 Other intervertebral disc degeneration, lumbar region: Secondary | ICD-10-CM | POA: Diagnosis not present

## 2018-06-15 DIAGNOSIS — M50922 Unspecified cervical disc disorder at C5-C6 level: Secondary | ICD-10-CM | POA: Diagnosis not present

## 2018-06-15 DIAGNOSIS — M25511 Pain in right shoulder: Secondary | ICD-10-CM | POA: Diagnosis not present

## 2018-06-17 DIAGNOSIS — G5761 Lesion of plantar nerve, right lower limb: Secondary | ICD-10-CM | POA: Diagnosis not present

## 2018-06-17 DIAGNOSIS — M7751 Other enthesopathy of right foot: Secondary | ICD-10-CM | POA: Diagnosis not present

## 2018-06-17 DIAGNOSIS — L600 Ingrowing nail: Secondary | ICD-10-CM | POA: Diagnosis not present

## 2018-06-19 ENCOUNTER — Other Ambulatory Visit: Payer: Self-pay | Admitting: Cardiology

## 2018-06-21 DIAGNOSIS — M50923 Unspecified cervical disc disorder at C6-C7 level: Secondary | ICD-10-CM | POA: Diagnosis not present

## 2018-06-21 DIAGNOSIS — M50922 Unspecified cervical disc disorder at C5-C6 level: Secondary | ICD-10-CM | POA: Diagnosis not present

## 2018-06-21 DIAGNOSIS — M5136 Other intervertebral disc degeneration, lumbar region: Secondary | ICD-10-CM | POA: Diagnosis not present

## 2018-06-21 DIAGNOSIS — M25511 Pain in right shoulder: Secondary | ICD-10-CM | POA: Diagnosis not present

## 2018-06-21 NOTE — Telephone Encounter (Signed)
Lisinopril refilled.

## 2018-06-26 ENCOUNTER — Other Ambulatory Visit: Payer: Self-pay | Admitting: Family Medicine

## 2018-06-28 DIAGNOSIS — M50922 Unspecified cervical disc disorder at C5-C6 level: Secondary | ICD-10-CM | POA: Diagnosis not present

## 2018-06-28 DIAGNOSIS — M50923 Unspecified cervical disc disorder at C6-C7 level: Secondary | ICD-10-CM | POA: Diagnosis not present

## 2018-06-28 DIAGNOSIS — M5136 Other intervertebral disc degeneration, lumbar region: Secondary | ICD-10-CM | POA: Diagnosis not present

## 2018-06-28 DIAGNOSIS — M25511 Pain in right shoulder: Secondary | ICD-10-CM | POA: Diagnosis not present

## 2018-07-01 DIAGNOSIS — G5761 Lesion of plantar nerve, right lower limb: Secondary | ICD-10-CM | POA: Diagnosis not present

## 2018-07-01 DIAGNOSIS — L600 Ingrowing nail: Secondary | ICD-10-CM | POA: Diagnosis not present

## 2018-07-05 DIAGNOSIS — M50922 Unspecified cervical disc disorder at C5-C6 level: Secondary | ICD-10-CM | POA: Diagnosis not present

## 2018-07-05 DIAGNOSIS — M50923 Unspecified cervical disc disorder at C6-C7 level: Secondary | ICD-10-CM | POA: Diagnosis not present

## 2018-07-05 DIAGNOSIS — M25511 Pain in right shoulder: Secondary | ICD-10-CM | POA: Diagnosis not present

## 2018-07-05 DIAGNOSIS — M5136 Other intervertebral disc degeneration, lumbar region: Secondary | ICD-10-CM | POA: Diagnosis not present

## 2018-07-05 DIAGNOSIS — L600 Ingrowing nail: Secondary | ICD-10-CM | POA: Diagnosis not present

## 2018-07-08 DIAGNOSIS — M533 Sacrococcygeal disorders, not elsewhere classified: Secondary | ICD-10-CM | POA: Diagnosis not present

## 2018-07-09 DIAGNOSIS — M25511 Pain in right shoulder: Secondary | ICD-10-CM | POA: Diagnosis not present

## 2018-07-09 DIAGNOSIS — M5136 Other intervertebral disc degeneration, lumbar region: Secondary | ICD-10-CM | POA: Diagnosis not present

## 2018-07-09 DIAGNOSIS — M50923 Unspecified cervical disc disorder at C6-C7 level: Secondary | ICD-10-CM | POA: Diagnosis not present

## 2018-07-09 DIAGNOSIS — M50922 Unspecified cervical disc disorder at C5-C6 level: Secondary | ICD-10-CM | POA: Diagnosis not present

## 2018-07-12 DIAGNOSIS — M25511 Pain in right shoulder: Secondary | ICD-10-CM | POA: Diagnosis not present

## 2018-07-12 DIAGNOSIS — M5136 Other intervertebral disc degeneration, lumbar region: Secondary | ICD-10-CM | POA: Diagnosis not present

## 2018-07-12 DIAGNOSIS — M50922 Unspecified cervical disc disorder at C5-C6 level: Secondary | ICD-10-CM | POA: Diagnosis not present

## 2018-07-12 DIAGNOSIS — M50923 Unspecified cervical disc disorder at C6-C7 level: Secondary | ICD-10-CM | POA: Diagnosis not present

## 2018-07-13 DIAGNOSIS — L570 Actinic keratosis: Secondary | ICD-10-CM | POA: Diagnosis not present

## 2018-07-13 DIAGNOSIS — L820 Inflamed seborrheic keratosis: Secondary | ICD-10-CM | POA: Diagnosis not present

## 2018-07-15 DIAGNOSIS — M50922 Unspecified cervical disc disorder at C5-C6 level: Secondary | ICD-10-CM | POA: Diagnosis not present

## 2018-07-15 DIAGNOSIS — M722 Plantar fascial fibromatosis: Secondary | ICD-10-CM | POA: Diagnosis not present

## 2018-07-15 DIAGNOSIS — M25511 Pain in right shoulder: Secondary | ICD-10-CM | POA: Diagnosis not present

## 2018-07-15 DIAGNOSIS — M50923 Unspecified cervical disc disorder at C6-C7 level: Secondary | ICD-10-CM | POA: Diagnosis not present

## 2018-07-15 DIAGNOSIS — M5136 Other intervertebral disc degeneration, lumbar region: Secondary | ICD-10-CM | POA: Diagnosis not present

## 2018-07-15 DIAGNOSIS — M65871 Other synovitis and tenosynovitis, right ankle and foot: Secondary | ICD-10-CM | POA: Diagnosis not present

## 2018-07-20 DIAGNOSIS — M25511 Pain in right shoulder: Secondary | ICD-10-CM | POA: Diagnosis not present

## 2018-07-20 DIAGNOSIS — M5136 Other intervertebral disc degeneration, lumbar region: Secondary | ICD-10-CM | POA: Diagnosis not present

## 2018-07-20 DIAGNOSIS — M50922 Unspecified cervical disc disorder at C5-C6 level: Secondary | ICD-10-CM | POA: Diagnosis not present

## 2018-07-20 DIAGNOSIS — M50923 Unspecified cervical disc disorder at C6-C7 level: Secondary | ICD-10-CM | POA: Diagnosis not present

## 2018-07-21 DIAGNOSIS — M7751 Other enthesopathy of right foot: Secondary | ICD-10-CM | POA: Diagnosis not present

## 2018-07-21 DIAGNOSIS — M25571 Pain in right ankle and joints of right foot: Secondary | ICD-10-CM | POA: Diagnosis not present

## 2018-07-22 DIAGNOSIS — M50922 Unspecified cervical disc disorder at C5-C6 level: Secondary | ICD-10-CM | POA: Diagnosis not present

## 2018-07-22 DIAGNOSIS — M50923 Unspecified cervical disc disorder at C6-C7 level: Secondary | ICD-10-CM | POA: Diagnosis not present

## 2018-07-22 DIAGNOSIS — M5136 Other intervertebral disc degeneration, lumbar region: Secondary | ICD-10-CM | POA: Diagnosis not present

## 2018-07-22 DIAGNOSIS — M25511 Pain in right shoulder: Secondary | ICD-10-CM | POA: Diagnosis not present

## 2018-07-23 DIAGNOSIS — F411 Generalized anxiety disorder: Secondary | ICD-10-CM | POA: Diagnosis not present

## 2018-07-27 DIAGNOSIS — M50923 Unspecified cervical disc disorder at C6-C7 level: Secondary | ICD-10-CM | POA: Diagnosis not present

## 2018-07-27 DIAGNOSIS — M25511 Pain in right shoulder: Secondary | ICD-10-CM | POA: Diagnosis not present

## 2018-07-27 DIAGNOSIS — M5136 Other intervertebral disc degeneration, lumbar region: Secondary | ICD-10-CM | POA: Diagnosis not present

## 2018-07-27 DIAGNOSIS — M50922 Unspecified cervical disc disorder at C5-C6 level: Secondary | ICD-10-CM | POA: Diagnosis not present

## 2018-07-29 DIAGNOSIS — M47896 Other spondylosis, lumbar region: Secondary | ICD-10-CM | POA: Diagnosis not present

## 2018-07-29 DIAGNOSIS — M5030 Other cervical disc degeneration, unspecified cervical region: Secondary | ICD-10-CM | POA: Diagnosis not present

## 2018-07-29 DIAGNOSIS — M5416 Radiculopathy, lumbar region: Secondary | ICD-10-CM | POA: Diagnosis not present

## 2018-07-29 DIAGNOSIS — M5136 Other intervertebral disc degeneration, lumbar region: Secondary | ICD-10-CM | POA: Diagnosis not present

## 2018-07-30 ENCOUNTER — Telehealth: Payer: Self-pay | Admitting: Cardiology

## 2018-07-30 NOTE — Telephone Encounter (Signed)
New message   Patient is returning call to pre-register for virtual visit on Monday. Please call.

## 2018-07-30 NOTE — Telephone Encounter (Signed)
Routed to [facility] who called patient to pre-register

## 2018-07-30 NOTE — Telephone Encounter (Signed)
Mychart, smartphone, pre reg complete 07/30/18 AF

## 2018-07-30 NOTE — Progress Notes (Signed)
Virtual Visit via Video Note   This visit type was conducted due to national recommendations for restrictions regarding the COVID-19 Pandemic (e.g. social distancing) in an effort to limit this patient's exposure and mitigate transmission in our community.  Due to his co-morbid illnesses, this patient is at least at moderate risk for complications without adequate follow up.  This format is felt to be most appropriate for this patient at this time.  All issues noted in this document were discussed and addressed.  A limited physical exam was performed with this format.  Please refer to the patient's chart for his consent to telehealth for [facility].   Date:  08/02/2018   ID:  Gloriann Loan, DOB [birth year], MRN [id number]  Patient Location: Home Provider Location: Home  PCP:  [name], MD  Cardiologist:  Dr Stanford Breed  Evaluation Performed:  Follow-Up Visit  Chief Complaint:  CAD  History of Present Illness:    FU coronary artery disease. Previous cardiac care in [place]. Cardiac catheterization performed in June of 2014 showed an ejection fraction of 35-40%. The inferior wall is akinetic. The left main was normal. There was a 30% proximal circumflex, occluded small first obtuse marginal with collaterals, a 50% ramus intermedius, 50% LAD, 50% first diagonal and an occluded right coronary artery with collaterals. Treated medically. Nuclear study January 2017 showed ejection fraction 35%. There was a prior inferior infarct with mild peri-infarct ischemia. Echocardiogram In January 2017 interpretednormal LV systolic function, trace aortic insufficiency mild mitral regurgitation and mild left atrial enlargement. I previously reviewed the echocardiogram and there appearsedto be akinesis of the inferior and basal inferior lateral wall. Ejection fraction in the 35-40% range. Cardiac MRI May 2017 showed akinetic inferior wall with ejection fraction 40%. Biatrial enlargement and mild  aortic/mitral regurgitation.MRA 3/19 showed mildly dilated aortic rootmeasuring 3.9 cm.Since last seen, the patient denies any dyspnea on exertion, orthopnea, PND, pedal edema, palpitations, syncope or chest pain.   The patient does not have symptoms concerning for COVID-19 infection (fever, chills, cough, or new shortness of breath).    Past Medical History:  Diagnosis Date   Arthritis    CAD (coronary artery disease) 6/14   Cardiac catheterization 6/27/ 2014 ejection fraction 35-40%, 30% proximal left circumflex, 100% tiny obtuse marginal 1 with collaterals, 50% LAD, 50% D1, 100% RCA with collaterals   Chicken pox    Colon polyps    Depression    Diabetes mellitus ([place])    Diet controlled   GERD (gastroesophageal reflux disease)    Hyperlipidemia    Hypertension    Kidney stone    Past Surgical History:  Procedure Laterality Date   TONSILLECTOMY  1963   TRIGGER FINGER RELEASE  2019     Current Meds  Medication Sig   acetaminophen (TYLENOL) 500 MG tablet Take 500 mg by mouth every 6 (six) hours as needed for moderate pain.   ALPRAZolam (XANAX) 0.25 MG tablet Take 0.25 mg by mouth daily.    aspirin 81 MG tablet Take 81 mg by mouth daily.   atorvastatin (LIPITOR) 80 MG tablet TAKE 1 TABLET BY MOUTH EVERY DAY   azelastine (ASTELIN) 0.1 % nasal spray Place 1 spray into both nostrils 2 (two) times daily. Use in each nostril as directed   Bismuth Tribromoph-Petrolatum (XEROFORM PETROLAT PATCH 4"X4") PADS USE AS DIRECTED TWICE A DAY EXTERNALLY 30 DAYS   carvedilol (COREG) 12.5 MG tablet TAKE 1 TABLET (12.5 MG TOTAL) BY MOUTH 2 (TWO) TIMES DAILY WITH A  MEAL.   celecoxib (CELEBREX) 200 MG capsule Take 200 mg by mouth daily.    clonazePAM (KLONOPIN) 0.5 MG tablet Take 0.5 mg by mouth at bedtime.    Dermatological Products, Misc. (LEVICYN) GEL Apply 1 application topically 2 (two) times daily after a meal.   Dermatological Products, Misc. (TETRIX) CREA Apply  topically See admin instructions.   ELIDEL 1 % cream APPLY TO AFFECTED AREA TWICE A DAY AS NEEDED FOR 14 DAYS   escitalopram (LEXAPRO) 20 MG tablet Take 20 mg by mouth daily.   fluticasone (FLONASE) 50 MCG/ACT nasal spray INHALE 2 SPRAYS IN EACH NOSTRIL EVERY DAY   gabapentin (NEURONTIN) 600 MG tablet TAKE 1 TABLET BY MOUTH TWICE A DAY   [DISCONTINUED] ezetimibe (ZETIA) 10 MG tablet TAKE 1 TABLET (10 MG TOTAL) BY MOUTH DAILY.     Allergies:   Latex and Sulfa antibiotics   Social History   Tobacco Use   Smoking status: Former Smoker    Last attempt to quit: 09/15/2002    Years since quitting: 15.8   Smokeless tobacco: Never Used   Tobacco comment: quit 15 years ago -socially  Substance Use Topics   Alcohol use: No    Alcohol/week: 0.0 standard drinks   Drug use: No     Family Hx: The patient's family history includes Arthritis in his father and paternal grandmother; Clotting disorder in his paternal grandfather and paternal grandmother; Colon polyps in his father; Heart disease in his maternal grandmother; Hyperlipidemia in his maternal grandmother; Liver cancer in his paternal grandfather; Melanoma in his mother. There is no history of Colon cancer, Esophageal cancer, or Rectal cancer.  ROS:   Please see the history of present illness.    No fevers, chills or productive cough.  Back pain. All other systems reviewed and are negative.  Recent Labs: 03/08/2018: ALT 18; BUN 18; Creatinine, Ser 1.01; Hemoglobin 13.4; Platelets 157.0; Potassium 4.6; Sodium 141; TSH 1.24   Recent Lipid Panel Lab Results  Component Value Date/Time   CHOL 139 03/08/2018 10:08 AM   TRIG 118.0 03/08/2018 10:08 AM   HDL 46.30 03/08/2018 10:08 AM   CHOLHDL 3 03/08/2018 10:08 AM   LDLCALC 69 03/08/2018 10:08 AM   LDLDIRECT 70.0 03/20/2016 08:10 AM    Wt Readings from Last 3 Encounters:  08/02/18 209 lb (94.8 kg)  06/01/18 220 lb 8 oz (100 kg)  05/13/18 217 lb 9.6 oz (98.7 kg)      Objective:    Vital Signs:  BP 114/78    Pulse 67    Ht 6\' 2"  (1.88 m)    Wt 209 lb (94.8 kg)    BMI 26.83 kg/m    VITAL SIGNS:  reviewed  No acute distress Answers questions appropriately Normal affect Remainder of physical examination not performed (telehealth visit; coronavirus pandemic)  ASSESSMENT & PLAN:    1. Coronary artery disease-patient doing well with no chest pain.  Plan to continue medical therapy with aspirin and statin. 2. Ischemic cardiomyopathy-continue ACE inhibitor and beta-blocker.  Is not have symptoms of congestive heart failure.  Repeat echocardiogram to quantify LV function. 3. Dilated aortic root-we will repeat MRA March 2021. 4. Hypertension-blood pressure is controlled.  Continue present medications and follow. 5. Hyperlipidemia-continue statin.  Laboratories December 2019 personally reviewed.  LDL 69 with normal liver functions.  COVID-19 Education: The importance of social distancing was discussed today.  Time:   Today, I have spent 15 minutes with the patient with telehealth technology discussing the above problems.  Medication Adjustments/Labs and Tests Ordered: Current medicines are reviewed at length with the patient today.  Concerns regarding medicines are outlined above.   Tests Ordered: No orders of the defined types were placed in this encounter.   Medication Changes: No orders of the defined types were placed in this encounter.   Disposition:  Follow up in 1 year(s)  Signed, Kirk Ruths, MD  08/02/2018 10:18 AM    [facility]

## 2018-08-02 ENCOUNTER — Telehealth (INDEPENDENT_AMBULATORY_CARE_PROVIDER_SITE_OTHER): Payer: BLUE CROSS/BLUE SHIELD | Admitting: Cardiology

## 2018-08-02 ENCOUNTER — Ambulatory Visit: Payer: BLUE CROSS/BLUE SHIELD | Admitting: Cardiology

## 2018-08-02 ENCOUNTER — Encounter: Payer: Self-pay | Admitting: Cardiology

## 2018-08-02 ENCOUNTER — Other Ambulatory Visit: Payer: Self-pay | Admitting: Family Medicine

## 2018-08-02 VITALS — BP 114/78 | HR 67 | Ht 74.0 in | Wt 209.0 lb

## 2018-08-02 DIAGNOSIS — E7800 Pure hypercholesterolemia, unspecified: Secondary | ICD-10-CM

## 2018-08-02 DIAGNOSIS — I10 Essential (primary) hypertension: Secondary | ICD-10-CM | POA: Diagnosis not present

## 2018-08-02 DIAGNOSIS — I255 Ischemic cardiomyopathy: Secondary | ICD-10-CM

## 2018-08-02 DIAGNOSIS — I2510 Atherosclerotic heart disease of native coronary artery without angina pectoris: Secondary | ICD-10-CM

## 2018-08-02 NOTE — Patient Instructions (Signed)
Medication Instructions:  NO CHANGE If you need a refill on your cardiac medications before your next appointment, please call your pharmacy.   Lab work: If you have labs (blood work) drawn today and your tests are completely normal, you will receive your results only by: . MyChart Message (if you have MyChart) OR . A paper copy in the mail If you have any lab test that is abnormal or we need to change your treatment, we will call you to review the results.  Testing/Procedures: Your physician has requested that you have an echocardiogram. Echocardiography is a painless test that uses sound waves to create images of your heart. It provides your doctor with information about the size and shape of your heart and how well your heart's chambers and valves are working. This procedure takes approximately one hour. There are no restrictions for this procedure.  [address]  Follow-Up: At [facility], you and your health needs are our priority.  As part of our continuing mission to provide you with exceptional heart care, we have created designated Provider Care Teams.  These Care Teams include your primary Cardiologist (physician) and Advanced Practice Providers (APPs -  Physician Assistants and Nurse Practitioners) who all work together to provide you with the care you need, when you need it. You will need a follow up appointment in 12 months.  Please call our office 2 months in advance to schedule this appointment.  You may see BRIAN CRENSHAW MD or one of the following Advanced Practice Providers on your designated Care Team:   Luke Kilroy, PA-C Krista Kroeger, PA-C . Callie Goodrich, PA-C     

## 2018-08-03 DIAGNOSIS — M50923 Unspecified cervical disc disorder at C6-C7 level: Secondary | ICD-10-CM | POA: Diagnosis not present

## 2018-08-03 DIAGNOSIS — M25511 Pain in right shoulder: Secondary | ICD-10-CM | POA: Diagnosis not present

## 2018-08-03 DIAGNOSIS — M65871 Other synovitis and tenosynovitis, right ankle and foot: Secondary | ICD-10-CM | POA: Diagnosis not present

## 2018-08-03 DIAGNOSIS — M7751 Other enthesopathy of right foot: Secondary | ICD-10-CM | POA: Diagnosis not present

## 2018-08-03 DIAGNOSIS — M50922 Unspecified cervical disc disorder at C5-C6 level: Secondary | ICD-10-CM | POA: Diagnosis not present

## 2018-08-03 DIAGNOSIS — M5136 Other intervertebral disc degeneration, lumbar region: Secondary | ICD-10-CM | POA: Diagnosis not present

## 2018-08-05 DIAGNOSIS — M50922 Unspecified cervical disc disorder at C5-C6 level: Secondary | ICD-10-CM | POA: Diagnosis not present

## 2018-08-05 DIAGNOSIS — M50923 Unspecified cervical disc disorder at C6-C7 level: Secondary | ICD-10-CM | POA: Diagnosis not present

## 2018-08-05 DIAGNOSIS — M25511 Pain in right shoulder: Secondary | ICD-10-CM | POA: Diagnosis not present

## 2018-08-05 DIAGNOSIS — M5136 Other intervertebral disc degeneration, lumbar region: Secondary | ICD-10-CM | POA: Diagnosis not present

## 2018-08-10 DIAGNOSIS — M50923 Unspecified cervical disc disorder at C6-C7 level: Secondary | ICD-10-CM | POA: Diagnosis not present

## 2018-08-10 DIAGNOSIS — M25511 Pain in right shoulder: Secondary | ICD-10-CM | POA: Diagnosis not present

## 2018-08-10 DIAGNOSIS — M5136 Other intervertebral disc degeneration, lumbar region: Secondary | ICD-10-CM | POA: Diagnosis not present

## 2018-08-10 DIAGNOSIS — M50922 Unspecified cervical disc disorder at C5-C6 level: Secondary | ICD-10-CM | POA: Diagnosis not present

## 2018-08-12 DIAGNOSIS — M25511 Pain in right shoulder: Secondary | ICD-10-CM | POA: Diagnosis not present

## 2018-08-12 DIAGNOSIS — M50922 Unspecified cervical disc disorder at C5-C6 level: Secondary | ICD-10-CM | POA: Diagnosis not present

## 2018-08-12 DIAGNOSIS — M50923 Unspecified cervical disc disorder at C6-C7 level: Secondary | ICD-10-CM | POA: Diagnosis not present

## 2018-08-12 DIAGNOSIS — M5136 Other intervertebral disc degeneration, lumbar region: Secondary | ICD-10-CM | POA: Diagnosis not present

## 2018-08-17 DIAGNOSIS — M50922 Unspecified cervical disc disorder at C5-C6 level: Secondary | ICD-10-CM | POA: Diagnosis not present

## 2018-08-17 DIAGNOSIS — M50923 Unspecified cervical disc disorder at C6-C7 level: Secondary | ICD-10-CM | POA: Diagnosis not present

## 2018-08-17 DIAGNOSIS — M5136 Other intervertebral disc degeneration, lumbar region: Secondary | ICD-10-CM | POA: Diagnosis not present

## 2018-08-17 DIAGNOSIS — M25511 Pain in right shoulder: Secondary | ICD-10-CM | POA: Diagnosis not present

## 2018-08-18 ENCOUNTER — Encounter: Payer: Self-pay | Admitting: Family Medicine

## 2018-08-18 DIAGNOSIS — M65871 Other synovitis and tenosynovitis, right ankle and foot: Secondary | ICD-10-CM | POA: Diagnosis not present

## 2018-08-18 DIAGNOSIS — M25571 Pain in right ankle and joints of right foot: Secondary | ICD-10-CM | POA: Diagnosis not present

## 2018-08-19 DIAGNOSIS — M25511 Pain in right shoulder: Secondary | ICD-10-CM | POA: Diagnosis not present

## 2018-08-19 DIAGNOSIS — M50922 Unspecified cervical disc disorder at C5-C6 level: Secondary | ICD-10-CM | POA: Diagnosis not present

## 2018-08-19 DIAGNOSIS — M5136 Other intervertebral disc degeneration, lumbar region: Secondary | ICD-10-CM | POA: Diagnosis not present

## 2018-08-19 DIAGNOSIS — M50923 Unspecified cervical disc disorder at C6-C7 level: Secondary | ICD-10-CM | POA: Diagnosis not present

## 2018-08-20 DIAGNOSIS — H1045 Other chronic allergic conjunctivitis: Secondary | ICD-10-CM | POA: Diagnosis not present

## 2018-08-23 DIAGNOSIS — F411 Generalized anxiety disorder: Secondary | ICD-10-CM | POA: Diagnosis not present

## 2018-08-24 DIAGNOSIS — M50922 Unspecified cervical disc disorder at C5-C6 level: Secondary | ICD-10-CM | POA: Diagnosis not present

## 2018-08-24 DIAGNOSIS — M50923 Unspecified cervical disc disorder at C6-C7 level: Secondary | ICD-10-CM | POA: Diagnosis not present

## 2018-08-24 DIAGNOSIS — M25511 Pain in right shoulder: Secondary | ICD-10-CM | POA: Diagnosis not present

## 2018-08-24 DIAGNOSIS — M5136 Other intervertebral disc degeneration, lumbar region: Secondary | ICD-10-CM | POA: Diagnosis not present

## 2018-08-25 DIAGNOSIS — L739 Follicular disorder, unspecified: Secondary | ICD-10-CM | POA: Diagnosis not present

## 2018-08-25 DIAGNOSIS — L820 Inflamed seborrheic keratosis: Secondary | ICD-10-CM | POA: Diagnosis not present

## 2018-08-25 DIAGNOSIS — L700 Acne vulgaris: Secondary | ICD-10-CM | POA: Diagnosis not present

## 2018-08-25 DIAGNOSIS — L570 Actinic keratosis: Secondary | ICD-10-CM | POA: Diagnosis not present

## 2018-08-26 DIAGNOSIS — M25511 Pain in right shoulder: Secondary | ICD-10-CM | POA: Diagnosis not present

## 2018-08-26 DIAGNOSIS — H1045 Other chronic allergic conjunctivitis: Secondary | ICD-10-CM | POA: Diagnosis not present

## 2018-08-26 DIAGNOSIS — M5136 Other intervertebral disc degeneration, lumbar region: Secondary | ICD-10-CM | POA: Diagnosis not present

## 2018-08-26 DIAGNOSIS — M50923 Unspecified cervical disc disorder at C6-C7 level: Secondary | ICD-10-CM | POA: Diagnosis not present

## 2018-08-26 DIAGNOSIS — M50922 Unspecified cervical disc disorder at C5-C6 level: Secondary | ICD-10-CM | POA: Diagnosis not present

## 2018-08-31 DIAGNOSIS — M5136 Other intervertebral disc degeneration, lumbar region: Secondary | ICD-10-CM | POA: Diagnosis not present

## 2018-08-31 DIAGNOSIS — M50923 Unspecified cervical disc disorder at C6-C7 level: Secondary | ICD-10-CM | POA: Diagnosis not present

## 2018-08-31 DIAGNOSIS — F329 Major depressive disorder, single episode, unspecified: Secondary | ICD-10-CM | POA: Diagnosis not present

## 2018-08-31 DIAGNOSIS — M50922 Unspecified cervical disc disorder at C5-C6 level: Secondary | ICD-10-CM | POA: Diagnosis not present

## 2018-08-31 DIAGNOSIS — M25511 Pain in right shoulder: Secondary | ICD-10-CM | POA: Diagnosis not present

## 2018-08-31 DIAGNOSIS — F411 Generalized anxiety disorder: Secondary | ICD-10-CM | POA: Diagnosis not present

## 2018-09-01 DIAGNOSIS — L570 Actinic keratosis: Secondary | ICD-10-CM | POA: Diagnosis not present

## 2018-09-01 DIAGNOSIS — L820 Inflamed seborrheic keratosis: Secondary | ICD-10-CM | POA: Diagnosis not present

## 2018-09-02 DIAGNOSIS — M25511 Pain in right shoulder: Secondary | ICD-10-CM | POA: Diagnosis not present

## 2018-09-02 DIAGNOSIS — M50922 Unspecified cervical disc disorder at C5-C6 level: Secondary | ICD-10-CM | POA: Diagnosis not present

## 2018-09-02 DIAGNOSIS — M50923 Unspecified cervical disc disorder at C6-C7 level: Secondary | ICD-10-CM | POA: Diagnosis not present

## 2018-09-02 DIAGNOSIS — M5136 Other intervertebral disc degeneration, lumbar region: Secondary | ICD-10-CM | POA: Diagnosis not present

## 2018-09-07 DIAGNOSIS — M5136 Other intervertebral disc degeneration, lumbar region: Secondary | ICD-10-CM | POA: Diagnosis not present

## 2018-09-07 DIAGNOSIS — M50922 Unspecified cervical disc disorder at C5-C6 level: Secondary | ICD-10-CM | POA: Diagnosis not present

## 2018-09-07 DIAGNOSIS — M50923 Unspecified cervical disc disorder at C6-C7 level: Secondary | ICD-10-CM | POA: Diagnosis not present

## 2018-09-07 DIAGNOSIS — M25511 Pain in right shoulder: Secondary | ICD-10-CM | POA: Diagnosis not present

## 2018-09-08 ENCOUNTER — Telehealth: Payer: Self-pay

## 2018-09-08 ENCOUNTER — Other Ambulatory Visit: Payer: Self-pay | Admitting: Family Medicine

## 2018-09-08 ENCOUNTER — Encounter: Payer: Self-pay | Admitting: Family Medicine

## 2018-09-08 DIAGNOSIS — F901 Attention-deficit hyperactivity disorder, predominantly hyperactive type: Secondary | ICD-10-CM | POA: Diagnosis not present

## 2018-09-08 DIAGNOSIS — F429 Obsessive-compulsive disorder, unspecified: Secondary | ICD-10-CM | POA: Diagnosis not present

## 2018-09-08 NOTE — Telephone Encounter (Signed)
Copied from [place] [id number]. Topic: General - Other >> Sep 08, 2018 11:54 AM Jodie Echevaria wrote: Reason for CRM: Patient called to ask Dr Elease Hashimoto if its ok for him to take Protonix twice daily. Asking for a call back at  Ph# [phone]

## 2018-09-08 NOTE — Telephone Encounter (Signed)
Patient has already had a response to his MyChart message.

## 2018-09-09 DIAGNOSIS — M5136 Other intervertebral disc degeneration, lumbar region: Secondary | ICD-10-CM | POA: Diagnosis not present

## 2018-09-09 DIAGNOSIS — M50923 Unspecified cervical disc disorder at C6-C7 level: Secondary | ICD-10-CM | POA: Diagnosis not present

## 2018-09-09 DIAGNOSIS — M50922 Unspecified cervical disc disorder at C5-C6 level: Secondary | ICD-10-CM | POA: Diagnosis not present

## 2018-09-09 DIAGNOSIS — M25511 Pain in right shoulder: Secondary | ICD-10-CM | POA: Diagnosis not present

## 2018-09-15 DIAGNOSIS — M5136 Other intervertebral disc degeneration, lumbar region: Secondary | ICD-10-CM | POA: Diagnosis not present

## 2018-09-15 DIAGNOSIS — M50922 Unspecified cervical disc disorder at C5-C6 level: Secondary | ICD-10-CM | POA: Diagnosis not present

## 2018-09-15 DIAGNOSIS — M50923 Unspecified cervical disc disorder at C6-C7 level: Secondary | ICD-10-CM | POA: Diagnosis not present

## 2018-09-15 DIAGNOSIS — M25511 Pain in right shoulder: Secondary | ICD-10-CM | POA: Diagnosis not present

## 2018-09-20 ENCOUNTER — Other Ambulatory Visit: Payer: Self-pay

## 2018-09-20 ENCOUNTER — Ambulatory Visit (HOSPITAL_COMMUNITY): Payer: BC Managed Care – PPO | Attending: Internal Medicine

## 2018-09-20 DIAGNOSIS — F901 Attention-deficit hyperactivity disorder, predominantly hyperactive type: Secondary | ICD-10-CM | POA: Diagnosis not present

## 2018-09-20 DIAGNOSIS — I255 Ischemic cardiomyopathy: Secondary | ICD-10-CM | POA: Diagnosis not present

## 2018-09-20 DIAGNOSIS — F429 Obsessive-compulsive disorder, unspecified: Secondary | ICD-10-CM | POA: Diagnosis not present

## 2018-09-21 ENCOUNTER — Other Ambulatory Visit: Payer: Self-pay | Admitting: Family Medicine

## 2018-09-21 DIAGNOSIS — M50923 Unspecified cervical disc disorder at C6-C7 level: Secondary | ICD-10-CM | POA: Diagnosis not present

## 2018-09-21 DIAGNOSIS — M25511 Pain in right shoulder: Secondary | ICD-10-CM | POA: Diagnosis not present

## 2018-09-21 DIAGNOSIS — M5136 Other intervertebral disc degeneration, lumbar region: Secondary | ICD-10-CM | POA: Diagnosis not present

## 2018-09-21 DIAGNOSIS — M50922 Unspecified cervical disc disorder at C5-C6 level: Secondary | ICD-10-CM | POA: Diagnosis not present

## 2018-09-23 ENCOUNTER — Other Ambulatory Visit: Payer: Self-pay | Admitting: Family Medicine

## 2018-09-23 DIAGNOSIS — M25511 Pain in right shoulder: Secondary | ICD-10-CM | POA: Diagnosis not present

## 2018-09-23 DIAGNOSIS — M5136 Other intervertebral disc degeneration, lumbar region: Secondary | ICD-10-CM | POA: Diagnosis not present

## 2018-09-23 DIAGNOSIS — M50923 Unspecified cervical disc disorder at C6-C7 level: Secondary | ICD-10-CM | POA: Diagnosis not present

## 2018-09-23 DIAGNOSIS — M50922 Unspecified cervical disc disorder at C5-C6 level: Secondary | ICD-10-CM | POA: Diagnosis not present

## 2018-09-27 DIAGNOSIS — F429 Obsessive-compulsive disorder, unspecified: Secondary | ICD-10-CM | POA: Diagnosis not present

## 2018-09-29 DIAGNOSIS — M5136 Other intervertebral disc degeneration, lumbar region: Secondary | ICD-10-CM | POA: Diagnosis not present

## 2018-09-29 DIAGNOSIS — M25511 Pain in right shoulder: Secondary | ICD-10-CM | POA: Diagnosis not present

## 2018-09-29 DIAGNOSIS — M50922 Unspecified cervical disc disorder at C5-C6 level: Secondary | ICD-10-CM | POA: Diagnosis not present

## 2018-09-29 DIAGNOSIS — M50923 Unspecified cervical disc disorder at C6-C7 level: Secondary | ICD-10-CM | POA: Diagnosis not present

## 2018-10-01 DIAGNOSIS — M50923 Unspecified cervical disc disorder at C6-C7 level: Secondary | ICD-10-CM | POA: Diagnosis not present

## 2018-10-01 DIAGNOSIS — M25511 Pain in right shoulder: Secondary | ICD-10-CM | POA: Diagnosis not present

## 2018-10-01 DIAGNOSIS — M7752 Other enthesopathy of left foot: Secondary | ICD-10-CM | POA: Diagnosis not present

## 2018-10-01 DIAGNOSIS — M25572 Pain in left ankle and joints of left foot: Secondary | ICD-10-CM | POA: Diagnosis not present

## 2018-10-01 DIAGNOSIS — M50922 Unspecified cervical disc disorder at C5-C6 level: Secondary | ICD-10-CM | POA: Diagnosis not present

## 2018-10-01 DIAGNOSIS — M5136 Other intervertebral disc degeneration, lumbar region: Secondary | ICD-10-CM | POA: Diagnosis not present

## 2018-10-04 ENCOUNTER — Other Ambulatory Visit: Payer: Self-pay

## 2018-10-04 ENCOUNTER — Ambulatory Visit (INDEPENDENT_AMBULATORY_CARE_PROVIDER_SITE_OTHER): Payer: BC Managed Care – PPO | Admitting: Family Medicine

## 2018-10-04 DIAGNOSIS — M50922 Unspecified cervical disc disorder at C5-C6 level: Secondary | ICD-10-CM | POA: Diagnosis not present

## 2018-10-04 DIAGNOSIS — F429 Obsessive-compulsive disorder, unspecified: Secondary | ICD-10-CM | POA: Diagnosis not present

## 2018-10-04 DIAGNOSIS — K219 Gastro-esophageal reflux disease without esophagitis: Secondary | ICD-10-CM

## 2018-10-04 DIAGNOSIS — M5136 Other intervertebral disc degeneration, lumbar region: Secondary | ICD-10-CM | POA: Diagnosis not present

## 2018-10-04 DIAGNOSIS — M25511 Pain in right shoulder: Secondary | ICD-10-CM | POA: Diagnosis not present

## 2018-10-04 DIAGNOSIS — M50923 Unspecified cervical disc disorder at C6-C7 level: Secondary | ICD-10-CM | POA: Diagnosis not present

## 2018-10-04 NOTE — Progress Notes (Signed)
Patient ID: Peter Snyder, male   DOB: 1955-09-21, 63 y.o.   MRN: [id number]  This visit type was conducted due to national recommendations for restrictions regarding the COVID-19 pandemic in an effort to limit this patient's exposure and mitigate transmission in our community.   Virtual Visit via Video Note  I connected with [facility] on 10/04/18 at 11:00 AM EDT by a video enabled telemedicine application and verified that I am speaking with the correct person using two identifiers.  Location patient: home Location provider:work or home office Persons participating in the virtual visit: patient, provider  I discussed the limitations of evaluation and management by telemedicine and the availability of in person appointments. The patient expressed understanding and agreed to proceed.   HPI: He has chronic problems including history of CAD, hypertension, GERD, essential tremor, kidney stones, dyslipidemia.  He called today to discuss GERD issues.  We had recently had him increase his Protonix to 40 mg twice daily.  He states he did not have any breakthrough symptoms until over the weekend.  Yesterday he ate a fairly heavy lunch and then went out walking afterwards.  He noticed some "sour "stomach and also has had occasional fleeting epigastric pain which lasts just seconds.  No chest pain.  No exertional symptoms.  Denies any recent melena.  Appetite and weight stable.  He does take Celebrex frequently for arthritis complaints.  No recent dysphagia   ROS: See pertinent positives and negatives per HPI.  Past Medical History:  Diagnosis Date  . Arthritis   . CAD (coronary artery disease) 6/14   Cardiac catheterization 6/27/ 2014 ejection fraction 35-40%, 30% proximal left circumflex, 100% tiny obtuse marginal 1 with collaterals, 50% LAD, 50% D1, 100% RCA with collaterals  . Chicken pox   . Colon polyps   . Depression   . Diabetes mellitus ([place])    Diet controlled  . GERD (gastroesophageal  reflux disease)   . Hyperlipidemia   . Hypertension   . Kidney stone     Past Surgical History:  Procedure Laterality Date  . TONSILLECTOMY  1963  . TRIGGER FINGER RELEASE  2019    Family History  Problem Relation Age of Onset  . Melanoma Mother        metastatic  . Arthritis Father   . Colon polyps Father   . Hyperlipidemia Maternal Grandmother   . Heart disease Maternal Grandmother   . Arthritis Paternal Grandmother   . Clotting disorder Paternal Grandmother   . Clotting disorder Paternal Grandfather   . Liver cancer Paternal Grandfather   . Colon cancer Neg Hx   . Esophageal cancer Neg Hx   . Rectal cancer Neg Hx     SOCIAL HX: Non-smoker.  Retired from pharmaceuticals   Current Outpatient Medications:  .  acetaminophen (TYLENOL) 500 MG tablet, Take 500 mg by mouth every 6 (six) hours as needed for moderate pain., Disp: , Rfl:  .  ALPRAZolam (XANAX) 0.25 MG tablet, Take 0.25 mg by mouth daily. , Disp: , Rfl:  .  aspirin 81 MG tablet, Take 81 mg by mouth daily., Disp: , Rfl:  .  atorvastatin (LIPITOR) 80 MG tablet, TAKE 1 TABLET BY MOUTH EVERY DAY, Disp: 90 tablet, Rfl: 1 .  azelastine (ASTELIN) 0.1 % nasal spray, PLACE 1 SPRAY INTO BOTH NOSTRILS 2 (TWO) TIMES DAILY AS DIRECTED, Disp: 30 mL, Rfl: 4 .  Bismuth Tribromoph-Petrolatum (XEROFORM PETROLAT PATCH 4"X4") PADS, USE AS DIRECTED TWICE A DAY EXTERNALLY 30 DAYS, Disp: ,  Rfl: 4 .  carvedilol (COREG) 12.5 MG tablet, TAKE 1 TABLET (12.5 MG TOTAL) BY MOUTH 2 (TWO) TIMES DAILY WITH A MEAL., Disp: 180 tablet, Rfl: 2 .  celecoxib (CELEBREX) 200 MG capsule, Take 200 mg by mouth daily. , Disp: , Rfl:  .  clonazePAM (KLONOPIN) 0.5 MG tablet, Take 0.5 mg by mouth at bedtime. , Disp: , Rfl:  .  Dermatological Products, Misc. (LEVICYN) GEL, Apply 1 application topically 2 (two) times daily after a meal., Disp: , Rfl: 2 .  Dermatological Products, Misc. (TETRIX) CREA, Apply topically See admin instructions., Disp: , Rfl: 3 .   ELIDEL 1 % cream, APPLY TO AFFECTED AREA TWICE A DAY AS NEEDED FOR 14 DAYS, Disp: , Rfl: 2 .  escitalopram (LEXAPRO) 20 MG tablet, Take 20 mg by mouth daily., Disp: , Rfl:  .  ezetimibe (ZETIA) 10 MG tablet, TAKE 1 TABLET (10 MG TOTAL) BY MOUTH DAILY., Disp: 90 tablet, Rfl: 1 .  fluticasone (FLONASE) 50 MCG/ACT nasal spray, INHALE 2 SPRAYS IN EACH NOSTRIL EVERY DAY, Disp: 48 g, Rfl: 2 .  gabapentin (NEURONTIN) 600 MG tablet, TAKE 1 TABLET BY MOUTH TWICE A DAY, Disp: 180 tablet, Rfl: 1 .  lisinopril (PRINIVIL,ZESTRIL) 20 MG tablet, TAKE 1 TABLET BY MOUTH EVERY DAY, Disp: 90 tablet, Rfl: 1 .  pantoprazole (PROTONIX) 40 MG tablet, TAKE 1 TABLET BY MOUTH EVERY DAY, Disp: 90 tablet, Rfl: 3 .  RESTASIS 0.05 % ophthalmic emulsion, Place 1 drop into both eyes 2 (two) times daily. , Disp: , Rfl:  .  tacrolimus (PROTOPIC) 0.1 % ointment, , Disp: , Rfl:  .  tamsulosin (FLOMAX) 0.4 MG CAPS capsule, TAKE 1 CAPSULE (0.4 MG TOTAL) BY MOUTH DAILY AFTER SUPPER., Disp: 90 capsule, Rfl: 1 .  triamcinolone cream (KENALOG) 0.1 %, APPLY 1 APPLICATION TOPICALLY 2 (TWO) TIMES DAILY AS NEEDED., Disp: 30 g, Rfl: 1 .  urea (CARMOL) 10 % cream, APPLY TO AFFECTED AREA TWICE A DAY AS NEEDED, Disp: , Rfl: 3 .  Urea 40 % LOTN, Apply 1 application topically 2 (two) times a week., Disp: , Rfl: 3 .  zolpidem (AMBIEN) 10 MG tablet, Take 10 mg by mouth at bedtime as needed for sleep., Disp: , Rfl:   EXAM:  VITALS per patient if applicable:  GENERAL: alert, oriented, appears well and in no acute distress  HEENT: atraumatic, conjunttiva clear, no obvious abnormalities on inspection of external nose and ears  NECK: normal movements of the head and neck  LUNGS: on inspection no signs of respiratory distress, breathing rate appears normal, no obvious gross SOB, gasping or wheezing  CV: no obvious cyanosis  MS: moves all visible extremities without noticeable abnormality  PSYCH/NEURO: pleasant and cooperative, no obvious  depression or anxiety, speech and thought processing grossly intact  ASSESSMENT AND PLAN:  Discussed the following assessment and plan:  Longstanding history of GERD.  He had some recent breakthrough symptoms.  He is not any red flags such as appetite change, weight loss, dysphagia, etc.  No melena.  -Avoid eating within 2 to 3 hours of exercise if possible -Consider adding over-the-counter Pepcid 20 mg twice daily for breakthrough symptoms -Leave off Celebrex as much as possible -We will recommend GI follow-up if he has any breakthrough symptoms with the above    I discussed the assessment and treatment plan with the patient. The patient was provided an opportunity to ask questions and all were answered. The patient agreed with the plan and demonstrated an understanding  of the instructions.   The patient was advised to call back or seek an in-person evaluation if the symptoms worsen or if the condition fails to improve as anticipated.   Carolann Littler, MD

## 2018-10-06 ENCOUNTER — Encounter: Payer: Self-pay | Admitting: Family Medicine

## 2018-10-06 DIAGNOSIS — M5136 Other intervertebral disc degeneration, lumbar region: Secondary | ICD-10-CM | POA: Diagnosis not present

## 2018-10-06 DIAGNOSIS — M50922 Unspecified cervical disc disorder at C5-C6 level: Secondary | ICD-10-CM | POA: Diagnosis not present

## 2018-10-06 DIAGNOSIS — M50923 Unspecified cervical disc disorder at C6-C7 level: Secondary | ICD-10-CM | POA: Diagnosis not present

## 2018-10-06 DIAGNOSIS — M25511 Pain in right shoulder: Secondary | ICD-10-CM | POA: Diagnosis not present

## 2018-10-11 DIAGNOSIS — F429 Obsessive-compulsive disorder, unspecified: Secondary | ICD-10-CM | POA: Diagnosis not present

## 2018-10-11 DIAGNOSIS — M25511 Pain in right shoulder: Secondary | ICD-10-CM | POA: Diagnosis not present

## 2018-10-11 DIAGNOSIS — M5136 Other intervertebral disc degeneration, lumbar region: Secondary | ICD-10-CM | POA: Diagnosis not present

## 2018-10-11 DIAGNOSIS — M50922 Unspecified cervical disc disorder at C5-C6 level: Secondary | ICD-10-CM | POA: Diagnosis not present

## 2018-10-11 DIAGNOSIS — M50923 Unspecified cervical disc disorder at C6-C7 level: Secondary | ICD-10-CM | POA: Diagnosis not present

## 2018-10-12 DIAGNOSIS — L570 Actinic keratosis: Secondary | ICD-10-CM | POA: Diagnosis not present

## 2018-10-13 DIAGNOSIS — M25571 Pain in right ankle and joints of right foot: Secondary | ICD-10-CM | POA: Diagnosis not present

## 2018-10-13 DIAGNOSIS — M25511 Pain in right shoulder: Secondary | ICD-10-CM | POA: Diagnosis not present

## 2018-10-13 DIAGNOSIS — M65871 Other synovitis and tenosynovitis, right ankle and foot: Secondary | ICD-10-CM | POA: Diagnosis not present

## 2018-10-13 DIAGNOSIS — M50923 Unspecified cervical disc disorder at C6-C7 level: Secondary | ICD-10-CM | POA: Diagnosis not present

## 2018-10-13 DIAGNOSIS — M50922 Unspecified cervical disc disorder at C5-C6 level: Secondary | ICD-10-CM | POA: Diagnosis not present

## 2018-10-13 DIAGNOSIS — M5136 Other intervertebral disc degeneration, lumbar region: Secondary | ICD-10-CM | POA: Diagnosis not present

## 2018-10-18 ENCOUNTER — Other Ambulatory Visit: Payer: Self-pay

## 2018-10-18 ENCOUNTER — Encounter: Payer: Self-pay | Admitting: Family Medicine

## 2018-10-18 DIAGNOSIS — M50922 Unspecified cervical disc disorder at C5-C6 level: Secondary | ICD-10-CM | POA: Diagnosis not present

## 2018-10-18 DIAGNOSIS — F429 Obsessive-compulsive disorder, unspecified: Secondary | ICD-10-CM | POA: Diagnosis not present

## 2018-10-18 DIAGNOSIS — M50923 Unspecified cervical disc disorder at C6-C7 level: Secondary | ICD-10-CM | POA: Diagnosis not present

## 2018-10-18 DIAGNOSIS — M5136 Other intervertebral disc degeneration, lumbar region: Secondary | ICD-10-CM | POA: Diagnosis not present

## 2018-10-18 DIAGNOSIS — M25511 Pain in right shoulder: Secondary | ICD-10-CM | POA: Diagnosis not present

## 2018-10-18 MED ORDER — PANTOPRAZOLE SODIUM 40 MG PO TBEC: DELAYED_RELEASE_TABLET | ORAL | 1 refills | Status: DC

## 2018-10-19 DIAGNOSIS — M62830 Muscle spasm of back: Secondary | ICD-10-CM | POA: Diagnosis not present

## 2018-10-20 DIAGNOSIS — M50923 Unspecified cervical disc disorder at C6-C7 level: Secondary | ICD-10-CM | POA: Diagnosis not present

## 2018-10-20 DIAGNOSIS — M25511 Pain in right shoulder: Secondary | ICD-10-CM | POA: Diagnosis not present

## 2018-10-20 DIAGNOSIS — M5136 Other intervertebral disc degeneration, lumbar region: Secondary | ICD-10-CM | POA: Diagnosis not present

## 2018-10-20 DIAGNOSIS — M50922 Unspecified cervical disc disorder at C5-C6 level: Secondary | ICD-10-CM | POA: Diagnosis not present

## 2018-10-21 DIAGNOSIS — M5136 Other intervertebral disc degeneration, lumbar region: Secondary | ICD-10-CM | POA: Diagnosis not present

## 2018-10-21 DIAGNOSIS — M25511 Pain in right shoulder: Secondary | ICD-10-CM | POA: Diagnosis not present

## 2018-10-21 DIAGNOSIS — M47817 Spondylosis without myelopathy or radiculopathy, lumbosacral region: Secondary | ICD-10-CM | POA: Diagnosis not present

## 2018-10-21 DIAGNOSIS — M50923 Unspecified cervical disc disorder at C6-C7 level: Secondary | ICD-10-CM | POA: Diagnosis not present

## 2018-10-21 DIAGNOSIS — M50922 Unspecified cervical disc disorder at C5-C6 level: Secondary | ICD-10-CM | POA: Diagnosis not present

## 2018-10-22 DIAGNOSIS — M25562 Pain in left knee: Secondary | ICD-10-CM | POA: Diagnosis not present

## 2018-10-25 DIAGNOSIS — M50922 Unspecified cervical disc disorder at C5-C6 level: Secondary | ICD-10-CM | POA: Diagnosis not present

## 2018-10-25 DIAGNOSIS — M25511 Pain in right shoulder: Secondary | ICD-10-CM | POA: Diagnosis not present

## 2018-10-25 DIAGNOSIS — M50923 Unspecified cervical disc disorder at C6-C7 level: Secondary | ICD-10-CM | POA: Diagnosis not present

## 2018-10-25 DIAGNOSIS — M5136 Other intervertebral disc degeneration, lumbar region: Secondary | ICD-10-CM | POA: Diagnosis not present

## 2018-10-28 DIAGNOSIS — M50922 Unspecified cervical disc disorder at C5-C6 level: Secondary | ICD-10-CM | POA: Diagnosis not present

## 2018-10-28 DIAGNOSIS — M50923 Unspecified cervical disc disorder at C6-C7 level: Secondary | ICD-10-CM | POA: Diagnosis not present

## 2018-10-28 DIAGNOSIS — M5136 Other intervertebral disc degeneration, lumbar region: Secondary | ICD-10-CM | POA: Diagnosis not present

## 2018-10-28 DIAGNOSIS — M25511 Pain in right shoulder: Secondary | ICD-10-CM | POA: Diagnosis not present

## 2018-10-31 ENCOUNTER — Other Ambulatory Visit: Payer: Self-pay | Admitting: Family Medicine

## 2018-11-01 DIAGNOSIS — M25562 Pain in left knee: Secondary | ICD-10-CM | POA: Diagnosis not present

## 2018-11-02 DIAGNOSIS — M5136 Other intervertebral disc degeneration, lumbar region: Secondary | ICD-10-CM | POA: Diagnosis not present

## 2018-11-02 DIAGNOSIS — M50922 Unspecified cervical disc disorder at C5-C6 level: Secondary | ICD-10-CM | POA: Diagnosis not present

## 2018-11-02 DIAGNOSIS — M25511 Pain in right shoulder: Secondary | ICD-10-CM | POA: Diagnosis not present

## 2018-11-02 DIAGNOSIS — M50923 Unspecified cervical disc disorder at C6-C7 level: Secondary | ICD-10-CM | POA: Diagnosis not present

## 2018-11-04 DIAGNOSIS — M50922 Unspecified cervical disc disorder at C5-C6 level: Secondary | ICD-10-CM | POA: Diagnosis not present

## 2018-11-04 DIAGNOSIS — M50923 Unspecified cervical disc disorder at C6-C7 level: Secondary | ICD-10-CM | POA: Diagnosis not present

## 2018-11-04 DIAGNOSIS — M25511 Pain in right shoulder: Secondary | ICD-10-CM | POA: Diagnosis not present

## 2018-11-04 DIAGNOSIS — M5136 Other intervertebral disc degeneration, lumbar region: Secondary | ICD-10-CM | POA: Diagnosis not present

## 2018-11-05 DIAGNOSIS — M47816 Spondylosis without myelopathy or radiculopathy, lumbar region: Secondary | ICD-10-CM | POA: Diagnosis not present

## 2018-11-05 DIAGNOSIS — M5136 Other intervertebral disc degeneration, lumbar region: Secondary | ICD-10-CM | POA: Diagnosis not present

## 2018-11-09 DIAGNOSIS — M50923 Unspecified cervical disc disorder at C6-C7 level: Secondary | ICD-10-CM | POA: Diagnosis not present

## 2018-11-09 DIAGNOSIS — M5136 Other intervertebral disc degeneration, lumbar region: Secondary | ICD-10-CM | POA: Diagnosis not present

## 2018-11-09 DIAGNOSIS — M25511 Pain in right shoulder: Secondary | ICD-10-CM | POA: Diagnosis not present

## 2018-11-09 DIAGNOSIS — M50922 Unspecified cervical disc disorder at C5-C6 level: Secondary | ICD-10-CM | POA: Diagnosis not present

## 2018-11-10 DIAGNOSIS — M25562 Pain in left knee: Secondary | ICD-10-CM | POA: Diagnosis not present

## 2018-11-11 DIAGNOSIS — M5136 Other intervertebral disc degeneration, lumbar region: Secondary | ICD-10-CM | POA: Diagnosis not present

## 2018-11-11 DIAGNOSIS — M50923 Unspecified cervical disc disorder at C6-C7 level: Secondary | ICD-10-CM | POA: Diagnosis not present

## 2018-11-11 DIAGNOSIS — M50922 Unspecified cervical disc disorder at C5-C6 level: Secondary | ICD-10-CM | POA: Diagnosis not present

## 2018-11-11 DIAGNOSIS — M25511 Pain in right shoulder: Secondary | ICD-10-CM | POA: Diagnosis not present

## 2018-11-16 DIAGNOSIS — M25511 Pain in right shoulder: Secondary | ICD-10-CM | POA: Diagnosis not present

## 2018-11-16 DIAGNOSIS — M5136 Other intervertebral disc degeneration, lumbar region: Secondary | ICD-10-CM | POA: Diagnosis not present

## 2018-11-16 DIAGNOSIS — M50922 Unspecified cervical disc disorder at C5-C6 level: Secondary | ICD-10-CM | POA: Diagnosis not present

## 2018-11-16 DIAGNOSIS — M50923 Unspecified cervical disc disorder at C6-C7 level: Secondary | ICD-10-CM | POA: Diagnosis not present

## 2018-11-18 DIAGNOSIS — M5136 Other intervertebral disc degeneration, lumbar region: Secondary | ICD-10-CM | POA: Diagnosis not present

## 2018-11-18 DIAGNOSIS — M25511 Pain in right shoulder: Secondary | ICD-10-CM | POA: Diagnosis not present

## 2018-11-18 DIAGNOSIS — M50923 Unspecified cervical disc disorder at C6-C7 level: Secondary | ICD-10-CM | POA: Diagnosis not present

## 2018-11-18 DIAGNOSIS — M50922 Unspecified cervical disc disorder at C5-C6 level: Secondary | ICD-10-CM | POA: Diagnosis not present

## 2018-11-19 DIAGNOSIS — M47816 Spondylosis without myelopathy or radiculopathy, lumbar region: Secondary | ICD-10-CM | POA: Diagnosis not present

## 2018-11-19 DIAGNOSIS — M5136 Other intervertebral disc degeneration, lumbar region: Secondary | ICD-10-CM | POA: Diagnosis not present

## 2018-11-24 DIAGNOSIS — M50923 Unspecified cervical disc disorder at C6-C7 level: Secondary | ICD-10-CM | POA: Diagnosis not present

## 2018-11-24 DIAGNOSIS — M25572 Pain in left ankle and joints of left foot: Secondary | ICD-10-CM | POA: Diagnosis not present

## 2018-11-24 DIAGNOSIS — M25511 Pain in right shoulder: Secondary | ICD-10-CM | POA: Diagnosis not present

## 2018-11-24 DIAGNOSIS — M5136 Other intervertebral disc degeneration, lumbar region: Secondary | ICD-10-CM | POA: Diagnosis not present

## 2018-11-24 DIAGNOSIS — M50922 Unspecified cervical disc disorder at C5-C6 level: Secondary | ICD-10-CM | POA: Diagnosis not present

## 2018-11-25 DIAGNOSIS — L820 Inflamed seborrheic keratosis: Secondary | ICD-10-CM | POA: Diagnosis not present

## 2018-11-25 DIAGNOSIS — L821 Other seborrheic keratosis: Secondary | ICD-10-CM | POA: Diagnosis not present

## 2018-11-25 DIAGNOSIS — L570 Actinic keratosis: Secondary | ICD-10-CM | POA: Diagnosis not present

## 2018-11-26 DIAGNOSIS — M50923 Unspecified cervical disc disorder at C6-C7 level: Secondary | ICD-10-CM | POA: Diagnosis not present

## 2018-11-26 DIAGNOSIS — M5136 Other intervertebral disc degeneration, lumbar region: Secondary | ICD-10-CM | POA: Diagnosis not present

## 2018-11-26 DIAGNOSIS — M25511 Pain in right shoulder: Secondary | ICD-10-CM | POA: Diagnosis not present

## 2018-11-26 DIAGNOSIS — M50922 Unspecified cervical disc disorder at C5-C6 level: Secondary | ICD-10-CM | POA: Diagnosis not present

## 2018-11-29 DIAGNOSIS — M25511 Pain in right shoulder: Secondary | ICD-10-CM | POA: Diagnosis not present

## 2018-11-29 DIAGNOSIS — F901 Attention-deficit hyperactivity disorder, predominantly hyperactive type: Secondary | ICD-10-CM | POA: Diagnosis not present

## 2018-11-29 DIAGNOSIS — F429 Obsessive-compulsive disorder, unspecified: Secondary | ICD-10-CM | POA: Diagnosis not present

## 2018-11-29 DIAGNOSIS — M5136 Other intervertebral disc degeneration, lumbar region: Secondary | ICD-10-CM | POA: Diagnosis not present

## 2018-11-29 DIAGNOSIS — M50923 Unspecified cervical disc disorder at C6-C7 level: Secondary | ICD-10-CM | POA: Diagnosis not present

## 2018-11-29 DIAGNOSIS — M50922 Unspecified cervical disc disorder at C5-C6 level: Secondary | ICD-10-CM | POA: Diagnosis not present

## 2018-12-01 DIAGNOSIS — M25562 Pain in left knee: Secondary | ICD-10-CM | POA: Diagnosis not present

## 2018-12-01 DIAGNOSIS — M7052 Other bursitis of knee, left knee: Secondary | ICD-10-CM | POA: Diagnosis not present

## 2018-12-02 DIAGNOSIS — M50923 Unspecified cervical disc disorder at C6-C7 level: Secondary | ICD-10-CM | POA: Diagnosis not present

## 2018-12-02 DIAGNOSIS — M50922 Unspecified cervical disc disorder at C5-C6 level: Secondary | ICD-10-CM | POA: Diagnosis not present

## 2018-12-02 DIAGNOSIS — M25511 Pain in right shoulder: Secondary | ICD-10-CM | POA: Diagnosis not present

## 2018-12-02 DIAGNOSIS — M5136 Other intervertebral disc degeneration, lumbar region: Secondary | ICD-10-CM | POA: Diagnosis not present

## 2018-12-03 ENCOUNTER — Other Ambulatory Visit: Payer: Self-pay | Admitting: Family Medicine

## 2018-12-03 DIAGNOSIS — M545 Low back pain: Secondary | ICD-10-CM | POA: Diagnosis not present

## 2018-12-07 DIAGNOSIS — M5136 Other intervertebral disc degeneration, lumbar region: Secondary | ICD-10-CM | POA: Diagnosis not present

## 2018-12-07 DIAGNOSIS — M50922 Unspecified cervical disc disorder at C5-C6 level: Secondary | ICD-10-CM | POA: Diagnosis not present

## 2018-12-07 DIAGNOSIS — M25511 Pain in right shoulder: Secondary | ICD-10-CM | POA: Diagnosis not present

## 2018-12-07 DIAGNOSIS — M50923 Unspecified cervical disc disorder at C6-C7 level: Secondary | ICD-10-CM | POA: Diagnosis not present

## 2018-12-08 DIAGNOSIS — M65871 Other synovitis and tenosynovitis, right ankle and foot: Secondary | ICD-10-CM | POA: Diagnosis not present

## 2018-12-08 DIAGNOSIS — G5762 Lesion of plantar nerve, left lower limb: Secondary | ICD-10-CM | POA: Diagnosis not present

## 2018-12-08 DIAGNOSIS — G5761 Lesion of plantar nerve, right lower limb: Secondary | ICD-10-CM | POA: Diagnosis not present

## 2018-12-08 DIAGNOSIS — M65872 Other synovitis and tenosynovitis, left ankle and foot: Secondary | ICD-10-CM | POA: Diagnosis not present

## 2018-12-09 ENCOUNTER — Other Ambulatory Visit: Payer: Self-pay | Admitting: Family Medicine

## 2018-12-09 DIAGNOSIS — M5136 Other intervertebral disc degeneration, lumbar region: Secondary | ICD-10-CM | POA: Diagnosis not present

## 2018-12-09 DIAGNOSIS — M50923 Unspecified cervical disc disorder at C6-C7 level: Secondary | ICD-10-CM | POA: Diagnosis not present

## 2018-12-09 DIAGNOSIS — M25511 Pain in right shoulder: Secondary | ICD-10-CM | POA: Diagnosis not present

## 2018-12-09 DIAGNOSIS — M50922 Unspecified cervical disc disorder at C5-C6 level: Secondary | ICD-10-CM | POA: Diagnosis not present

## 2018-12-10 ENCOUNTER — Other Ambulatory Visit: Payer: Self-pay | Admitting: Cardiology

## 2018-12-10 DIAGNOSIS — M62830 Muscle spasm of back: Secondary | ICD-10-CM | POA: Diagnosis not present

## 2018-12-13 DIAGNOSIS — F429 Obsessive-compulsive disorder, unspecified: Secondary | ICD-10-CM | POA: Diagnosis not present

## 2018-12-13 DIAGNOSIS — F901 Attention-deficit hyperactivity disorder, predominantly hyperactive type: Secondary | ICD-10-CM | POA: Diagnosis not present

## 2018-12-14 DIAGNOSIS — M50923 Unspecified cervical disc disorder at C6-C7 level: Secondary | ICD-10-CM | POA: Diagnosis not present

## 2018-12-14 DIAGNOSIS — M5136 Other intervertebral disc degeneration, lumbar region: Secondary | ICD-10-CM | POA: Diagnosis not present

## 2018-12-14 DIAGNOSIS — M25511 Pain in right shoulder: Secondary | ICD-10-CM | POA: Diagnosis not present

## 2018-12-14 DIAGNOSIS — M50922 Unspecified cervical disc disorder at C5-C6 level: Secondary | ICD-10-CM | POA: Diagnosis not present

## 2018-12-16 DIAGNOSIS — M25511 Pain in right shoulder: Secondary | ICD-10-CM | POA: Diagnosis not present

## 2018-12-16 DIAGNOSIS — M5136 Other intervertebral disc degeneration, lumbar region: Secondary | ICD-10-CM | POA: Diagnosis not present

## 2018-12-16 DIAGNOSIS — M50923 Unspecified cervical disc disorder at C6-C7 level: Secondary | ICD-10-CM | POA: Diagnosis not present

## 2018-12-16 DIAGNOSIS — M50922 Unspecified cervical disc disorder at C5-C6 level: Secondary | ICD-10-CM | POA: Diagnosis not present

## 2018-12-20 DIAGNOSIS — M50923 Unspecified cervical disc disorder at C6-C7 level: Secondary | ICD-10-CM | POA: Diagnosis not present

## 2018-12-20 DIAGNOSIS — M50922 Unspecified cervical disc disorder at C5-C6 level: Secondary | ICD-10-CM | POA: Diagnosis not present

## 2018-12-20 DIAGNOSIS — M25511 Pain in right shoulder: Secondary | ICD-10-CM | POA: Diagnosis not present

## 2018-12-20 DIAGNOSIS — M5136 Other intervertebral disc degeneration, lumbar region: Secondary | ICD-10-CM | POA: Diagnosis not present

## 2018-12-22 ENCOUNTER — Other Ambulatory Visit: Payer: Self-pay | Admitting: Family Medicine

## 2018-12-22 DIAGNOSIS — M25511 Pain in right shoulder: Secondary | ICD-10-CM | POA: Diagnosis not present

## 2018-12-22 DIAGNOSIS — Z713 Dietary counseling and surveillance: Secondary | ICD-10-CM | POA: Diagnosis not present

## 2018-12-22 DIAGNOSIS — M50922 Unspecified cervical disc disorder at C5-C6 level: Secondary | ICD-10-CM | POA: Diagnosis not present

## 2018-12-22 DIAGNOSIS — M5136 Other intervertebral disc degeneration, lumbar region: Secondary | ICD-10-CM | POA: Diagnosis not present

## 2018-12-22 DIAGNOSIS — M50923 Unspecified cervical disc disorder at C6-C7 level: Secondary | ICD-10-CM | POA: Diagnosis not present

## 2018-12-23 ENCOUNTER — Other Ambulatory Visit: Payer: Self-pay | Admitting: Cardiology

## 2018-12-23 DIAGNOSIS — M5136 Other intervertebral disc degeneration, lumbar region: Secondary | ICD-10-CM | POA: Diagnosis not present

## 2018-12-23 DIAGNOSIS — M25511 Pain in right shoulder: Secondary | ICD-10-CM | POA: Diagnosis not present

## 2018-12-23 DIAGNOSIS — M65871 Other synovitis and tenosynovitis, right ankle and foot: Secondary | ICD-10-CM | POA: Diagnosis not present

## 2018-12-23 DIAGNOSIS — M50922 Unspecified cervical disc disorder at C5-C6 level: Secondary | ICD-10-CM | POA: Diagnosis not present

## 2018-12-23 DIAGNOSIS — M7751 Other enthesopathy of right foot: Secondary | ICD-10-CM | POA: Diagnosis not present

## 2018-12-23 DIAGNOSIS — M50923 Unspecified cervical disc disorder at C6-C7 level: Secondary | ICD-10-CM | POA: Diagnosis not present

## 2018-12-26 ENCOUNTER — Other Ambulatory Visit: Payer: Self-pay | Admitting: Family Medicine

## 2018-12-27 DIAGNOSIS — M50922 Unspecified cervical disc disorder at C5-C6 level: Secondary | ICD-10-CM | POA: Diagnosis not present

## 2018-12-27 DIAGNOSIS — F901 Attention-deficit hyperactivity disorder, predominantly hyperactive type: Secondary | ICD-10-CM | POA: Diagnosis not present

## 2018-12-27 DIAGNOSIS — M5136 Other intervertebral disc degeneration, lumbar region: Secondary | ICD-10-CM | POA: Diagnosis not present

## 2018-12-27 DIAGNOSIS — F429 Obsessive-compulsive disorder, unspecified: Secondary | ICD-10-CM | POA: Diagnosis not present

## 2018-12-27 DIAGNOSIS — M25511 Pain in right shoulder: Secondary | ICD-10-CM | POA: Diagnosis not present

## 2018-12-27 DIAGNOSIS — M50923 Unspecified cervical disc disorder at C6-C7 level: Secondary | ICD-10-CM | POA: Diagnosis not present

## 2018-12-28 ENCOUNTER — Other Ambulatory Visit: Payer: Self-pay

## 2018-12-28 ENCOUNTER — Telehealth (INDEPENDENT_AMBULATORY_CARE_PROVIDER_SITE_OTHER): Payer: BC Managed Care – PPO | Admitting: Family Medicine

## 2018-12-28 ENCOUNTER — Other Ambulatory Visit: Payer: Self-pay | Admitting: Family Medicine

## 2018-12-28 DIAGNOSIS — R0981 Nasal congestion: Secondary | ICD-10-CM | POA: Diagnosis not present

## 2018-12-28 DIAGNOSIS — Z7189 Other specified counseling: Secondary | ICD-10-CM | POA: Diagnosis not present

## 2018-12-28 DIAGNOSIS — R195 Other fecal abnormalities: Secondary | ICD-10-CM

## 2018-12-28 NOTE — Progress Notes (Signed)
Virtual Visit via Video Note  I connected with [facility] on 12/28/18 at  2:30 PM EDT by a video enabled telemedicine application 2/2 [id number] pandemic and verified that I am speaking with the correct person using two identifiers.  Location patient: home Location provider:work or home office Persons participating in the virtual visit: patient, provider  I discussed the limitations of evaluation and management by telemedicine and the availability of in person appointments. The patient expressed understanding and agreed to proceed.   HPI: Pt is a 63 yo male with pmh sig for GERD, HTN, CAD, diet controlled DM, arthritis, HLD, insomnia, h/o depression seen by Dr. Elease Hashimoto.  Pt seen for acute concern of diarrhea.  Pt notes 3-4 episodes of loose stools x 1 day.  Pt noticed stomach pain this am, but was able to walk 4 miles, then eat a salad for lunch prior to symptoms starting.  Pt notes he has been eating more activia yogurt (3 cups/day).  Pt also endorses nasal congestion.  Denies sore throat, cough, fever, HAs, ear pain pressure, n/v, blood in stools, dizziness.  Pt unsure of sick contacts- went walking with a friend who had "allergies".  Pt inquires about COVID-19 testing as his granddaughter is suppose to come visit on Thursday.  Pt has not tried anything for his symptoms.   ROS: See pertinent positives and negatives per HPI.  Past Medical History:  Diagnosis Date  . Arthritis   . CAD (coronary artery disease) 6/14   Cardiac catheterization 6/27/ 2014 ejection fraction 35-40%, 30% proximal left circumflex, 100% tiny obtuse marginal 1 with collaterals, 50% LAD, 50% D1, 100% RCA with collaterals  . Chicken pox   . Colon polyps   . Depression   . Diabetes mellitus ([place])    Diet controlled  . GERD (gastroesophageal reflux disease)   . Hyperlipidemia   . Hypertension   . Kidney stone     Past Surgical History:  Procedure Laterality Date  . TONSILLECTOMY  1963  . TRIGGER FINGER RELEASE   2019    Family History  Problem Relation Age of Onset  . Melanoma Mother        metastatic  . Arthritis Father   . Colon polyps Father   . Hyperlipidemia Maternal Grandmother   . Heart disease Maternal Grandmother   . Arthritis Paternal Grandmother   . Clotting disorder Paternal Grandmother   . Clotting disorder Paternal Grandfather   . Liver cancer Paternal Grandfather   . Colon cancer Neg Hx   . Esophageal cancer Neg Hx   . Rectal cancer Neg Hx     Current Outpatient Medications:  .  acetaminophen (TYLENOL) 500 MG tablet, Take 500 mg by mouth every 6 (six) hours as needed for moderate pain., Disp: , Rfl:  .  ALPRAZolam (XANAX) 0.25 MG tablet, Take 0.25 mg by mouth daily. , Disp: , Rfl:  .  aspirin 81 MG tablet, Take 81 mg by mouth daily., Disp: , Rfl:  .  atorvastatin (LIPITOR) 80 MG tablet, TAKE 1 TABLET BY MOUTH EVERY DAY, Disp: 90 tablet, Rfl: 1 .  azelastine (ASTELIN) 0.1 % nasal spray, PLACE 1 SPRAY INTO BOTH NOSTRILS 2 (TWO) TIMES DAILY AS DIRECTED, Disp: 30 mL, Rfl: 1 .  Bismuth Tribromoph-Petrolatum (XEROFORM PETROLAT PATCH 4"X4") PADS, USE AS DIRECTED TWICE A DAY EXTERNALLY 30 DAYS, Disp: , Rfl: 4 .  carvedilol (COREG) 12.5 MG tablet, TAKE 1 TABLET (12.5 MG TOTAL) BY MOUTH 2 (TWO) TIMES DAILY WITH A MEAL., Disp: 180  tablet, Rfl: 2 .  celecoxib (CELEBREX) 200 MG capsule, Take 200 mg by mouth daily. , Disp: , Rfl:  .  clonazePAM (KLONOPIN) 0.5 MG tablet, Take 0.5 mg by mouth at bedtime. , Disp: , Rfl:  .  Dermatological Products, Misc. (LEVICYN) GEL, Apply 1 application topically 2 (two) times daily after a meal., Disp: , Rfl: 2 .  Dermatological Products, Misc. (TETRIX) CREA, Apply topically See admin instructions., Disp: , Rfl: 3 .  ELIDEL 1 % cream, APPLY TO AFFECTED AREA TWICE A DAY AS NEEDED FOR 14 DAYS, Disp: , Rfl: 2 .  escitalopram (LEXAPRO) 20 MG tablet, Take 20 mg by mouth daily., Disp: , Rfl:  .  ezetimibe (ZETIA) 10 MG tablet, TAKE 1 TABLET (10 MG TOTAL) BY  MOUTH DAILY., Disp: 90 tablet, Rfl: 1 .  fluticasone (FLONASE) 50 MCG/ACT nasal spray, USE 2 SPRAYS IN EACH NOSTRIL EVERY DAY, Disp: 48 mL, Rfl: 2 .  gabapentin (NEURONTIN) 600 MG tablet, TAKE 1 TABLET BY MOUTH TWICE A DAY, Disp: 180 tablet, Rfl: 1 .  lisinopril (ZESTRIL) 20 MG tablet, TAKE 1 TABLET BY MOUTH EVERY DAY, Disp: 90 tablet, Rfl: 1 .  pantoprazole (PROTONIX) 40 MG tablet, TAKE 1 TABLET BY MOUTH TWO TIMES DAILY., Disp: 180 tablet, Rfl: 1 .  RESTASIS 0.05 % ophthalmic emulsion, Place 1 drop into both eyes 2 (two) times daily. , Disp: , Rfl:  .  tacrolimus (PROTOPIC) 0.1 % ointment, , Disp: , Rfl:  .  tamsulosin (FLOMAX) 0.4 MG CAPS capsule, TAKE 1 CAPSULE (0.4 MG TOTAL) BY MOUTH DAILY AFTER SUPPER., Disp: 90 capsule, Rfl: 1 .  triamcinolone cream (KENALOG) 0.1 %, APPLY 1 APPLICATION TOPICALLY 2 (TWO) TIMES DAILY AS NEEDED., Disp: 30 g, Rfl: 1 .  urea (CARMOL) 10 % cream, APPLY TO AFFECTED AREA TWICE A DAY AS NEEDED, Disp: , Rfl: 3 .  Urea 40 % LOTN, Apply 1 application topically 2 (two) times a week., Disp: , Rfl: 3 .  zolpidem (AMBIEN) 10 MG tablet, Take 10 mg by mouth at bedtime as needed for sleep., Disp: , Rfl:   EXAM:  VITALS per patient if applicable:  GENERAL: alert, oriented, appears well and in no acute distress  HEENT: atraumatic, conjunctiva clear, no obvious abnormalities on inspection of external nose and ears  NECK: normal movements of the head and neck  LUNGS: on inspection no signs of respiratory distress, breathing rate appears normal, no obvious gross SOB, gasping or wheezing  CV: no obvious cyanosis  MS: moves all visible extremities without noticeable abnormality  PSYCH/NEURO: pleasant and cooperative, no obvious depression or anxiety, speech and thought processing grossly intact  ASSESSMENT AND PLAN:  Discussed the following assessment and plan:  Loose stools -3-4 episodes that started today -discussed diet modifications.  Pt to decrease the amount  of yogurt eaten in a day and limit salad intake for the next few days. -discussed eating a bland diet and advancing as tolerated. -pt will try imodium prn -encouraged to stay hydrated. -Given precautions.  Pt to monitor for worsening symptoms.  For continued symptoms consider COVID testing.    Nasal congestion -possibly 2/2 seasonal allergies vs viral etiology. Cannot r/o COVID infection -consider OTC allergy med prn.  Ok to use flonase.  Educated about COVID-19 virus infection -discussed possible symptoms -pt given info on community testing. -if symptoms continue or worsen tomorrow pt to have testing done.  F/u prn with pcp    I discussed the assessment and treatment plan with the  patient. The patient was provided an opportunity to ask questions and all were answered. The patient agreed with the plan and demonstrated an understanding of the instructions.   The patient was advised to call back or seek an in-person evaluation if the symptoms worsen or if the condition fails to improve as anticipated.   Billie Ruddy, MD

## 2018-12-29 DIAGNOSIS — M50922 Unspecified cervical disc disorder at C5-C6 level: Secondary | ICD-10-CM | POA: Diagnosis not present

## 2018-12-29 DIAGNOSIS — M50923 Unspecified cervical disc disorder at C6-C7 level: Secondary | ICD-10-CM | POA: Diagnosis not present

## 2018-12-29 DIAGNOSIS — M25511 Pain in right shoulder: Secondary | ICD-10-CM | POA: Diagnosis not present

## 2018-12-29 DIAGNOSIS — M5136 Other intervertebral disc degeneration, lumbar region: Secondary | ICD-10-CM | POA: Diagnosis not present

## 2019-01-05 DIAGNOSIS — M5136 Other intervertebral disc degeneration, lumbar region: Secondary | ICD-10-CM | POA: Diagnosis not present

## 2019-01-05 DIAGNOSIS — M50922 Unspecified cervical disc disorder at C5-C6 level: Secondary | ICD-10-CM | POA: Diagnosis not present

## 2019-01-05 DIAGNOSIS — M50923 Unspecified cervical disc disorder at C6-C7 level: Secondary | ICD-10-CM | POA: Diagnosis not present

## 2019-01-05 DIAGNOSIS — M25511 Pain in right shoulder: Secondary | ICD-10-CM | POA: Diagnosis not present

## 2019-01-07 DIAGNOSIS — M25571 Pain in right ankle and joints of right foot: Secondary | ICD-10-CM | POA: Diagnosis not present

## 2019-01-07 DIAGNOSIS — G5761 Lesion of plantar nerve, right lower limb: Secondary | ICD-10-CM | POA: Diagnosis not present

## 2019-01-08 ENCOUNTER — Other Ambulatory Visit: Payer: Self-pay | Admitting: Family Medicine

## 2019-01-10 DIAGNOSIS — F429 Obsessive-compulsive disorder, unspecified: Secondary | ICD-10-CM | POA: Diagnosis not present

## 2019-01-10 DIAGNOSIS — F901 Attention-deficit hyperactivity disorder, predominantly hyperactive type: Secondary | ICD-10-CM | POA: Diagnosis not present

## 2019-01-11 DIAGNOSIS — M5136 Other intervertebral disc degeneration, lumbar region: Secondary | ICD-10-CM | POA: Diagnosis not present

## 2019-01-11 DIAGNOSIS — M25511 Pain in right shoulder: Secondary | ICD-10-CM | POA: Diagnosis not present

## 2019-01-11 DIAGNOSIS — M50922 Unspecified cervical disc disorder at C5-C6 level: Secondary | ICD-10-CM | POA: Diagnosis not present

## 2019-01-11 DIAGNOSIS — M50923 Unspecified cervical disc disorder at C6-C7 level: Secondary | ICD-10-CM | POA: Diagnosis not present

## 2019-01-12 ENCOUNTER — Encounter: Payer: Self-pay | Admitting: Family Medicine

## 2019-01-12 ENCOUNTER — Other Ambulatory Visit: Payer: Self-pay

## 2019-01-12 ENCOUNTER — Ambulatory Visit (INDEPENDENT_AMBULATORY_CARE_PROVIDER_SITE_OTHER): Payer: BC Managed Care – PPO | Admitting: Family Medicine

## 2019-01-12 VITALS — BP 118/76 | HR 66 | Temp 97.6°F | Resp 16 | Ht 74.0 in | Wt 222.5 lb

## 2019-01-12 DIAGNOSIS — H938X1 Other specified disorders of right ear: Secondary | ICD-10-CM | POA: Diagnosis not present

## 2019-01-12 DIAGNOSIS — L570 Actinic keratosis: Secondary | ICD-10-CM | POA: Diagnosis not present

## 2019-01-12 NOTE — Patient Instructions (Signed)
Suspect serous otitis which is related to the nasal congeston  May take several weeks to resolve.

## 2019-01-12 NOTE — Progress Notes (Signed)
  Subjective:     Patient ID: Peter Snyder, male   DOB: 1955-05-26, 63 y.o.   MRN: [id number]  HPI   Peter Snyder is seen with some right ear pressure for past couple days.  He has some chronic sinus issues but really very little nasal congestion recently.  He felt he had some fluid in his ear yesterday.  He had some mild pain this morning.  No fevers or chills.  He takes Flonase and Astelin regularly.  No recent vertigo symptoms.  He has some chronic tinnitus intermittently but none recently.  No acute hearing changes.  Has been walking 4-5 miles per day and feels great overall.  Past Medical History:  Diagnosis Date  . Arthritis   . CAD (coronary artery disease) 6/14   Cardiac catheterization 6/27/ 2014 ejection fraction 35-40%, 30% proximal left circumflex, 100% tiny obtuse marginal 1 with collaterals, 50% LAD, 50% D1, 100% RCA with collaterals  . Chicken pox   . Colon polyps   . Depression   . Diabetes mellitus ([place])    Diet controlled  . GERD (gastroesophageal reflux disease)   . Hyperlipidemia   . Hypertension   . Kidney stone    Past Surgical History:  Procedure Laterality Date  . TONSILLECTOMY  1963  . TRIGGER FINGER RELEASE  2019    reports that he quit smoking about 16 years ago. He has never used smokeless tobacco. He reports that he does not drink alcohol or use drugs. family history includes Arthritis in his father and paternal grandmother; Clotting disorder in his paternal grandfather and paternal grandmother; Colon polyps in his father; Heart disease in his maternal grandmother; Hyperlipidemia in his maternal grandmother; Liver cancer in his paternal grandfather; Melanoma in his mother. Allergies  Allergen Reactions  . Latex Other (See Comments)    Other  . Sulfa Antibiotics Rash     Review of Systems  Constitutional: Negative for chills and fever.  HENT: Negative for congestion, ear discharge and hearing loss.   Respiratory: Negative for cough.   Cardiovascular:  Negative for chest pain.       Objective:   Physical Exam Vitals signs reviewed.  Constitutional:      Appearance: Normal appearance.  HENT:     Right Ear: Tympanic membrane and ear canal normal.     Left Ear: Tympanic membrane and ear canal normal.  Cardiovascular:     Rate and Rhythm: Normal rate and regular rhythm.  Pulmonary:     Effort: Pulmonary effort is normal.     Breath sounds: Normal breath sounds.  Neurological:     Mental Status: He is alert.        Assessment:     Right ear symptoms.  Normal exam.  No obvious effusion.  Question eustachian tube dysfunction    Plan:     -He will try Valsalva maneuver intermittently.  Continue Flonase and Astelin.  Touch base if symptoms persist  Peter Post MD [facility]

## 2019-01-13 ENCOUNTER — Ambulatory Visit: Payer: BC Managed Care – PPO | Admitting: Family Medicine

## 2019-01-13 DIAGNOSIS — M50923 Unspecified cervical disc disorder at C6-C7 level: Secondary | ICD-10-CM | POA: Diagnosis not present

## 2019-01-13 DIAGNOSIS — M5136 Other intervertebral disc degeneration, lumbar region: Secondary | ICD-10-CM | POA: Diagnosis not present

## 2019-01-13 DIAGNOSIS — M25511 Pain in right shoulder: Secondary | ICD-10-CM | POA: Diagnosis not present

## 2019-01-13 DIAGNOSIS — M50922 Unspecified cervical disc disorder at C5-C6 level: Secondary | ICD-10-CM | POA: Diagnosis not present

## 2019-01-17 ENCOUNTER — Ambulatory Visit (INDEPENDENT_AMBULATORY_CARE_PROVIDER_SITE_OTHER): Payer: BC Managed Care – PPO | Admitting: Internal Medicine

## 2019-01-17 ENCOUNTER — Encounter: Payer: Self-pay | Admitting: Family Medicine

## 2019-01-17 ENCOUNTER — Encounter: Payer: Self-pay | Admitting: Internal Medicine

## 2019-01-17 ENCOUNTER — Other Ambulatory Visit: Payer: Self-pay

## 2019-01-17 VITALS — BP 120/62 | HR 72 | Temp 97.7°F | Wt 223.6 lb

## 2019-01-17 DIAGNOSIS — G4762 Sleep related leg cramps: Secondary | ICD-10-CM

## 2019-01-17 DIAGNOSIS — M50922 Unspecified cervical disc disorder at C5-C6 level: Secondary | ICD-10-CM | POA: Diagnosis not present

## 2019-01-17 DIAGNOSIS — M5136 Other intervertebral disc degeneration, lumbar region: Secondary | ICD-10-CM | POA: Diagnosis not present

## 2019-01-17 DIAGNOSIS — M25511 Pain in right shoulder: Secondary | ICD-10-CM | POA: Diagnosis not present

## 2019-01-17 DIAGNOSIS — M50923 Unspecified cervical disc disorder at C6-C7 level: Secondary | ICD-10-CM | POA: Diagnosis not present

## 2019-01-17 NOTE — Patient Instructions (Addendum)
Cconsideration of checking   Potassium magnesium.    And  Sugar.  But an wiat  Until  you are due. In December.  Can try hydration  Can do a trial off of   Atorvastatin off  And then re institute .   To see if makes a difference.    good shoes  with exercise.   Circulation looks good today .   Make cpx and labs end  Of December .      Leg Cramps Leg cramps occur when one or more muscles tighten and you have no control over this tightening (involuntary muscle contraction). Muscle cramps can develop in any muscle, but the most common place is in the calf muscles of the leg. Those cramps can occur during exercise or when you are at rest. Leg cramps are painful, and they may last for a few seconds to a few minutes. Cramps may return several times before they finally stop. Usually, leg cramps are not caused by a serious medical problem. In many cases, the cause is not known. Some common causes include:  Excessive physical effort (overexertion), such as during intense exercise.  Overuse from repetitive motions, or doing the same thing over and over.  Staying in a certain position for a long period of time.  Improper preparation, form, or technique while performing a sport or an activity.  Dehydration.  Injury.  Side effects of certain medicines.  Abnormally low levels of minerals in your blood (electrolytes), especially potassium and calcium. This could result from: ? Pregnancy. ? Taking diuretic medicines. Follow these instructions at home: Eating and drinking  Drink enough fluid to keep your urine pale yellow. Staying hydrated may help prevent cramps.  Eat a healthy diet that includes plenty of nutrients to help your muscles function. A healthy diet includes fruits and vegetables, lean protein, whole grains, and low-fat or nonfat dairy products. Managing pain, stiffness, and swelling      Try massaging, stretching, and relaxing the affected muscle. Do this for several minutes  at a time.  If directed, put ice on areas that are sore or painful after a cramp: ? Put ice in a plastic bag. ? Place a towel between your skin and the bag. ? Leave the ice on for 20 minutes, 2-3 times a day.  If directed, apply heat to muscles that are tense or tight. Do this before you exercise, or as often as told by your health care provider. Use the heat source that your health care provider recommends, such as a moist heat pack or a heating pad. ? Place a towel between your skin and the heat source. ? Leave the heat on for 20-30 minutes. ? Remove the heat if your skin turns bright red. This is especially important if you are unable to feel pain, heat, or cold. You may have a greater risk of getting burned.  Try taking hot showers or baths to help relax tight muscles. General instructions  If you are having frequent leg cramps, avoid intense exercise for several days.  Take over-the-counter and prescription medicines only as told by your health care provider.  Keep all follow-up visits as told by your health care provider. This is important. Contact a health care provider if:  Your leg cramps get more severe or more frequent, or they do not improve over time.  Your foot becomes cold, numb, or blue. Summary  Muscle cramps can develop in any muscle, but the most common place is in the  calf muscles of the leg.  Leg cramps are painful, and they may last for a few seconds to a few minutes.  Usually, leg cramps are not caused by a serious medical problem. Often, the cause is not known.  Stay hydrated and take over-the-counter and prescription medicines only as told by your health care provider. This information is not intended to replace advice given to you by your health care provider. Make sure you discuss any questions you have with your health care provider. Document Released: 04/10/2004 Document Revised: 02/13/2017 Document Reviewed: 12/11/2016 Elsevier Patient Education   2020 [employer].

## 2019-01-17 NOTE — Progress Notes (Signed)
Chief Complaint  Patient presents with  . leg cramp    pt states that this has been going on for 3 weeks mostly in right leg     HPI: Peter Snyder 63 y.o. come in for SDA  PCP NA  Concern about  Ongoing  Legs cramps at night  Right leg more than left   At night .   X 3 weeks.    Then last night  Again  Feels  Like  Twitching   But no weakness  Or injury .  Walks  For  4.3 miles per day.  Active . And pushing an hour + of walking   7 years of   Atorvastatin   And wonder if could contribute   No sig day sx   Nocturnal cramps.  No sig eth some tea at lunch time    No change in  meds  On gabapentin for neuropathy/  No numbness or weakness  Is not on diuretic  ROS: See pertinent positives and negatives per HPI. No cp sob  Swelling    Past Medical History:  Diagnosis Date  . Arthritis   . CAD (coronary artery disease) 6/14   Cardiac catheterization 6/27/ 2014 ejection fraction 35-40%, 30% proximal left circumflex, 100% tiny obtuse marginal 1 with collaterals, 50% LAD, 50% D1, 100% RCA with collaterals  . Chicken pox   . Colon polyps   . Depression   . Diabetes mellitus ([place])    Diet controlled  . GERD (gastroesophageal reflux disease)   . Hyperlipidemia   . Hypertension   . Kidney stone     Family History  Problem Relation Age of Onset  . Melanoma Mother        metastatic  . Arthritis Father   . Colon polyps Father   . Hyperlipidemia Maternal Grandmother   . Heart disease Maternal Grandmother   . Arthritis Paternal Grandmother   . Clotting disorder Paternal Grandmother   . Clotting disorder Paternal Grandfather   . Liver cancer Paternal Grandfather   . Colon cancer Neg Hx   . Esophageal cancer Neg Hx   . Rectal cancer Neg Hx     Social History   Socioeconomic History  . Marital status: Married    Spouse name: Not on file  . Number of children: 3  . Years of education: Not on file  . Highest education level: Not on file  Occupational History  . Occupation:  retired    Comment: Retired  Scientific laboratory technician  . Financial resource strain: Not on file  . Food insecurity    Worry: Not on file    Inability: Not on file  . Transportation needs    Medical: Not on file    Non-medical: Not on file  Tobacco Use  . Smoking status: Former Smoker    Quit date: 09/15/2002    Years since quitting: 16.3  . Smokeless tobacco: Never Used  . Tobacco comment: quit 15 years ago -socially  Substance and Sexual Activity  . Alcohol use: No    Alcohol/week: 0.0 standard drinks  . Drug use: No  . Sexual activity: Not on file  Lifestyle  . Physical activity    Days per week: Not on file    Minutes per session: Not on file  . Stress: Not on file  Relationships  . Social Herbalist on phone: Not on file    Gets together: Not on file    Attends religious service:  Not on file    Active member of club or organization: Not on file    Attends meetings of clubs or organizations: Not on file    Relationship status: Not on file  Other Topics Concern  . Not on file  Social History Narrative  . Not on file    Outpatient Medications Prior to Visit  Medication Sig Dispense Refill  . acetaminophen (TYLENOL) 500 MG tablet Take 500 mg by mouth every 6 (six) hours as needed for moderate pain.    Marland Kitchen ALPRAZolam (XANAX) 0.25 MG tablet Take 0.25 mg by mouth daily.     Marland Kitchen aspirin 81 MG tablet Take 81 mg by mouth daily.    Marland Kitchen atorvastatin (LIPITOR) 80 MG tablet TAKE 1 TABLET BY MOUTH EVERY DAY 90 tablet 1  . azelastine (ASTELIN) 0.1 % nasal spray PLACE 1 SPRAY INTO BOTH NOSTRILS 2 (TWO) TIMES DAILY AS DIRECTED 30 mL 1  . Bismuth Tribromoph-Petrolatum (XEROFORM PETROLAT PATCH 4"X4") PADS USE AS DIRECTED TWICE A DAY EXTERNALLY 30 DAYS  4  . carvedilol (COREG) 12.5 MG tablet TAKE 1 TABLET (12.5 MG TOTAL) BY MOUTH 2 (TWO) TIMES DAILY WITH A MEAL. 180 tablet 2  . celecoxib (CELEBREX) 200 MG capsule Take 200 mg by mouth daily.     . clonazePAM (KLONOPIN) 0.5 MG tablet Take 0.5 mg  by mouth at bedtime.     . Dermatological Products, Misc. (LEVICYN) GEL Apply 1 application topically 2 (two) times daily after a meal.  2  . Dermatological Products, Misc. (TETRIX) CREA Apply topically See admin instructions.  3  . ELIDEL 1 % cream APPLY TO AFFECTED AREA TWICE A DAY AS NEEDED FOR 14 DAYS  2  . escitalopram (LEXAPRO) 20 MG tablet Take 20 mg by mouth daily.    Marland Kitchen ezetimibe (ZETIA) 10 MG tablet TAKE 1 TABLET BY MOUTH EVERY DAY 90 tablet 1  . fluticasone (FLONASE) 50 MCG/ACT nasal spray USE 2 SPRAYS IN EACH NOSTRIL EVERY DAY 48 mL 2  . gabapentin (NEURONTIN) 600 MG tablet TAKE 1 TABLET BY MOUTH TWICE A DAY 180 tablet 1  . lisinopril (ZESTRIL) 20 MG tablet TAKE 1 TABLET BY MOUTH EVERY DAY 90 tablet 1  . pantoprazole (PROTONIX) 40 MG tablet TAKE 1 TABLET BY MOUTH EVERY DAY 90 tablet 3  . RESTASIS 0.05 % ophthalmic emulsion Place 1 drop into both eyes 2 (two) times daily.     . tamsulosin (FLOMAX) 0.4 MG CAPS capsule TAKE 1 CAPSULE (0.4 MG TOTAL) BY MOUTH DAILY AFTER SUPPER. 90 capsule 1  . urea (CARMOL) 10 % cream APPLY TO AFFECTED AREA TWICE A DAY AS NEEDED  3  . Urea 40 % LOTN Apply 1 application topically 2 (two) times a week.  3  . zolpidem (AMBIEN) 10 MG tablet Take 10 mg by mouth at bedtime as needed for sleep.     No facility-administered medications prior to visit.      EXAM:  BP 120/62 (BP Location: Right Arm, Patient Position: Sitting, Cuff Size: Normal)   Pulse 72   Temp 97.7 F (36.5 C) (Temporal)   Wt 223 lb 9.6 oz (101.4 kg)   SpO2 97%   BMI 28.71 kg/m   Body mass index is 28.71 kg/m.  GENERAL: vitals reviewed and listed above, alert, oriented, appears well hydrated and in no acute distress HEENT: atraumatic, conjunctiva  clear, no obvious abnormalities on inspection of external nose and ears OP : masked  NECK: no obvious masses on inspection palpation  LUNGS: clear to auscultation bilaterally, no wheezes, rales or rhonchi, good air movement CV: HRRR, no  clubbing cyanosis or  peripheral edema nl cap refill  Nl pulses and no bruits noted  Abdomen:  Sof,t normal bowel sounds without hepatosplenomegaly, no guarding rebound or masses no CVA tenderness MS: moves all extremities without noticeable focal  Abnormality rom nl  Neg homans feet normal no ulcers or lesions   PSYCH: pleasant and cooperative, no obvious depression or anxiety Lab Results  Component Value Date   WBC 5.6 03/08/2018   HGB 13.4 03/08/2018   HCT 39.6 03/08/2018   PLT 157.0 03/08/2018   GLUCOSE 114 (H) 03/08/2018   CHOL 139 03/08/2018   TRIG 118.0 03/08/2018   HDL 46.30 03/08/2018   LDLDIRECT 70.0 03/20/2016   LDLCALC 69 03/08/2018   ALT 18 03/08/2018   AST 16 03/08/2018   NA 141 03/08/2018   K 4.6 03/08/2018   CL 103 03/08/2018   CREATININE 1.01 03/08/2018   BUN 18 03/08/2018   CO2 31 03/08/2018   TSH 1.24 03/08/2018   PSA 1.31 03/08/2018   HGBA1C 6.1 03/08/2018   MICROALBUR <0.7 03/20/2016   BP Readings from Last 3 Encounters:  01/17/19 120/62  01/12/19 118/76  08/02/18 114/78    ASSESSMENT AND PLAN:  Discussed the following assessment and plan:  Nocturnal leg cramps Newer onset disc  Diff dx  No obvious  vascular issue seen today  Options to dcheck labs  Early but decided to work on hydration and stretching  consider trial off and on atorva but may not be related  No evidence of dvt based on exam and context  And no claudication.  -Patient advised to return or notify health care team  if  new concerns arise.  Patient Instructions  Cconsideration of checking   Potassium magnesium.    And  Sugar.  But an wiat  Until  you are due. In December.  Can try hydration  Can do a trial off of   Atorvastatin off  And then re institute .   To see if makes a difference.    good shoes  with exercise.   Circulation looks good today .   Make cpx and labs end  Of December .      Leg Cramps Leg cramps occur when one or more muscles tighten and you have no  control over this tightening (involuntary muscle contraction). Muscle cramps can develop in any muscle, but the most common place is in the calf muscles of the leg. Those cramps can occur during exercise or when you are at rest. Leg cramps are painful, and they may last for a few seconds to a few minutes. Cramps may return several times before they finally stop. Usually, leg cramps are not caused by a serious medical problem. In many cases, the cause is not known. Some common causes include:  Excessive physical effort (overexertion), such as during intense exercise.  Overuse from repetitive motions, or doing the same thing over and over.  Staying in a certain position for a long period of time.  Improper preparation, form, or technique while performing a sport or an activity.  Dehydration.  Injury.  Side effects of certain medicines.  Abnormally low levels of minerals in your blood (electrolytes), especially potassium and calcium. This could result from: ? Pregnancy. ? Taking diuretic medicines. Follow these instructions at home: Eating and drinking  Drink enough fluid to keep your urine pale yellow. Staying hydrated may help prevent  cramps.  Eat a healthy diet that includes plenty of nutrients to help your muscles function. A healthy diet includes fruits and vegetables, lean protein, whole grains, and low-fat or nonfat dairy products. Managing pain, stiffness, and swelling      Try massaging, stretching, and relaxing the affected muscle. Do this for several minutes at a time.  If directed, put ice on areas that are sore or painful after a cramp: ? Put ice in a plastic bag. ? Place a towel between your skin and the bag. ? Leave the ice on for 20 minutes, 2-3 times a day.  If directed, apply heat to muscles that are tense or tight. Do this before you exercise, or as often as told by your health care provider. Use the heat source that your health care provider recommends, such as  a moist heat pack or a heating pad. ? Place a towel between your skin and the heat source. ? Leave the heat on for 20-30 minutes. ? Remove the heat if your skin turns bright red. This is especially important if you are unable to feel pain, heat, or cold. You may have a greater risk of getting burned.  Try taking hot showers or baths to help relax tight muscles. General instructions  If you are having frequent leg cramps, avoid intense exercise for several days.  Take over-the-counter and prescription medicines only as told by your health care provider.  Keep all follow-up visits as told by your health care provider. This is important. Contact a health care provider if:  Your leg cramps get more severe or more frequent, or they do not improve over time.  Your foot becomes cold, numb, or blue. Summary  Muscle cramps can develop in any muscle, but the most common place is in the calf muscles of the leg.  Leg cramps are painful, and they may last for a few seconds to a few minutes.  Usually, leg cramps are not caused by a serious medical problem. Often, the cause is not known.  Stay hydrated and take over-the-counter and prescription medicines only as told by your health care provider. This information is not intended to replace advice given to you by your health care provider. Make sure you discuss any questions you have with your health care provider. Document Released: 04/10/2004 Document Revised: 02/13/2017 Document Reviewed: 12/11/2016 Elsevier Patient Education  2020 [facility] Peter Snyder M.D.

## 2019-01-20 DIAGNOSIS — M25511 Pain in right shoulder: Secondary | ICD-10-CM | POA: Diagnosis not present

## 2019-01-20 DIAGNOSIS — M50922 Unspecified cervical disc disorder at C5-C6 level: Secondary | ICD-10-CM | POA: Diagnosis not present

## 2019-01-20 DIAGNOSIS — M50923 Unspecified cervical disc disorder at C6-C7 level: Secondary | ICD-10-CM | POA: Diagnosis not present

## 2019-01-20 DIAGNOSIS — M5136 Other intervertebral disc degeneration, lumbar region: Secondary | ICD-10-CM | POA: Diagnosis not present

## 2019-01-21 ENCOUNTER — Other Ambulatory Visit: Payer: Self-pay | Admitting: Family Medicine

## 2019-01-24 DIAGNOSIS — M5136 Other intervertebral disc degeneration, lumbar region: Secondary | ICD-10-CM | POA: Diagnosis not present

## 2019-01-24 DIAGNOSIS — M50922 Unspecified cervical disc disorder at C5-C6 level: Secondary | ICD-10-CM | POA: Diagnosis not present

## 2019-01-24 DIAGNOSIS — M25511 Pain in right shoulder: Secondary | ICD-10-CM | POA: Diagnosis not present

## 2019-01-24 DIAGNOSIS — M50923 Unspecified cervical disc disorder at C6-C7 level: Secondary | ICD-10-CM | POA: Diagnosis not present

## 2019-01-26 ENCOUNTER — Ambulatory Visit (INDEPENDENT_AMBULATORY_CARE_PROVIDER_SITE_OTHER): Payer: BC Managed Care – PPO | Admitting: Family Medicine

## 2019-01-26 ENCOUNTER — Encounter: Payer: Self-pay | Admitting: Family Medicine

## 2019-01-26 ENCOUNTER — Other Ambulatory Visit: Payer: Self-pay

## 2019-01-26 DIAGNOSIS — R252 Cramp and spasm: Secondary | ICD-10-CM

## 2019-01-26 NOTE — Progress Notes (Signed)
Subjective:    Patient ID: Peter Snyder, male    DOB: [birth year], 63 y.o.   MRN: [id number]  HPI Here for leg cramps that started a few weeks ago. These come and go at any time of day. they are usually in the calves.He drinks plenty of water every day and eats a banana daily. He walks 4-5 miles every day.  Virtual Visit via Video Note  I connected with the patient on 01/26/19 at  3:00 PM EST by a video enabled telemedicine application and verified that I am speaking with the correct person using two identifiers.  Location patient: home Location provider:work or home office Persons participating in the virtual visit: patient, provider  I discussed the limitations of evaluation and management by telemedicine and the availability of in person appointments. The patient expressed understanding and agreed to proceed.   HPI:    ROS: See pertinent positives and negatives per HPI.  Past Medical History:  Diagnosis Date  . Arthritis   . CAD (coronary artery disease) 6/14   Cardiac catheterization 6/27/ 2014 ejection fraction 35-40%, 30% proximal left circumflex, 100% tiny obtuse marginal 1 with collaterals, 50% LAD, 50% D1, 100% RCA with collaterals  . Chicken pox   . Colon polyps   . Depression   . Diabetes mellitus ([place])    Diet controlled  . GERD (gastroesophageal reflux disease)   . Hyperlipidemia   . Hypertension   . Kidney stone     Past Surgical History:  Procedure Laterality Date  . TONSILLECTOMY  1963  . TRIGGER FINGER RELEASE  2019    Family History  Problem Relation Age of Onset  . Melanoma Mother        metastatic  . Arthritis Father   . Colon polyps Father   . Hyperlipidemia Maternal Grandmother   . Heart disease Maternal Grandmother   . Arthritis Paternal Grandmother   . Clotting disorder Paternal Grandmother   . Clotting disorder Paternal Grandfather   . Liver cancer Paternal Grandfather   . Colon cancer Neg Hx   . Esophageal cancer Neg Hx   . Rectal  cancer Neg Hx      Current Outpatient Medications:  .  acetaminophen (TYLENOL) 500 MG tablet, Take 500 mg by mouth every 6 (six) hours as needed for moderate pain., Disp: , Rfl:  .  ALPRAZolam (XANAX) 0.25 MG tablet, Take 0.25 mg by mouth daily. , Disp: , Rfl:  .  aspirin 81 MG tablet, Take 81 mg by mouth daily., Disp: , Rfl:  .  atorvastatin (LIPITOR) 80 MG tablet, TAKE 1 TABLET BY MOUTH EVERY DAY, Disp: 90 tablet, Rfl: 1 .  azelastine (ASTELIN) 0.1 % nasal spray, PLACE 1 SPRAY INTO BOTH NOSTRILS 2 (TWO) TIMES DAILY AS DIRECTED, Disp: 30 mL, Rfl: 1 .  Bismuth Tribromoph-Petrolatum (XEROFORM PETROLAT PATCH 4"X4") PADS, USE AS DIRECTED TWICE A DAY EXTERNALLY 30 DAYS, Disp: , Rfl: 4 .  carvedilol (COREG) 12.5 MG tablet, TAKE 1 TABLET (12.5 MG TOTAL) BY MOUTH 2 (TWO) TIMES DAILY WITH A MEAL., Disp: 180 tablet, Rfl: 2 .  celecoxib (CELEBREX) 200 MG capsule, Take 200 mg by mouth daily. , Disp: , Rfl:  .  clonazePAM (KLONOPIN) 0.5 MG tablet, Take 0.5 mg by mouth at bedtime. , Disp: , Rfl:  .  Dermatological Products, Misc. (LEVICYN) GEL, Apply 1 application topically 2 (two) times daily after a meal., Disp: , Rfl: 2 .  Dermatological Products, Misc. (TETRIX) CREA, Apply topically See admin instructions., Disp: ,  Rfl: 3 .  ELIDEL 1 % cream, APPLY TO AFFECTED AREA TWICE A DAY AS NEEDED FOR 14 DAYS, Disp: , Rfl: 2 .  escitalopram (LEXAPRO) 20 MG tablet, Take 20 mg by mouth daily., Disp: , Rfl:  .  ezetimibe (ZETIA) 10 MG tablet, TAKE 1 TABLET BY MOUTH EVERY DAY, Disp: 90 tablet, Rfl: 1 .  fluticasone (FLONASE) 50 MCG/ACT nasal spray, USE 2 SPRAYS IN EACH NOSTRIL EVERY DAY, Disp: 48 mL, Rfl: 2 .  gabapentin (NEURONTIN) 600 MG tablet, TAKE 1 TABLET BY MOUTH TWICE A DAY, Disp: 180 tablet, Rfl: 1 .  lisinopril (ZESTRIL) 20 MG tablet, TAKE 1 TABLET BY MOUTH EVERY DAY, Disp: 90 tablet, Rfl: 1 .  pantoprazole (PROTONIX) 40 MG tablet, TAKE 1 TABLET BY MOUTH EVERY DAY, Disp: 90 tablet, Rfl: 3 .  RESTASIS 0.05 %  ophthalmic emulsion, Place 1 drop into both eyes 2 (two) times daily. , Disp: , Rfl:  .  tamsulosin (FLOMAX) 0.4 MG CAPS capsule, TAKE 1 CAPSULE (0.4 MG TOTAL) BY MOUTH DAILY AFTER SUPPER., Disp: 90 capsule, Rfl: 1 .  urea (CARMOL) 10 % cream, APPLY TO AFFECTED AREA TWICE A DAY AS NEEDED, Disp: , Rfl: 3 .  Urea 40 % LOTN, Apply 1 application topically 2 (two) times a week., Disp: , Rfl: 3 .  zolpidem (AMBIEN) 10 MG tablet, Take 10 mg by mouth at bedtime as needed for sleep., Disp: , Rfl:   EXAM:  VITALS per patient if applicable:  GENERAL: alert, oriented, appears well and in no acute distress  HEENT: atraumatic, conjunttiva clear, no obvious abnormalities on inspection of external nose and ears  NECK: normal movements of the head and neck  LUNGS: on inspection no signs of respiratory distress, breathing rate appears normal, no obvious gross SOB, gasping or wheezing  CV: no obvious cyanosis  MS: moves all visible extremities without noticeable abnormality  PSYCH/NEURO: pleasant and cooperative, no obvious depression or anxiety, speech and thought processing grossly intact  ASSESSMENT AND PLAN: Leg cramps. I suggested he get some 400 mg magnesium tablets OTC and take 2-3 of these every day. Recheck prn.  Alysia Penna, MD  Discussed the following assessment and plan:  No diagnosis found.     I discussed the assessment and treatment plan with the patient. The patient was provided an opportunity to ask questions and all were answered. The patient agreed with the plan and demonstrated an understanding of the instructions.   The patient was advised to call back or seek an in-person evaluation if the symptoms worsen or if the condition fails to improve as anticipated.   Review of Systems     Objective:   Physical Exam        Assessment & Plan:

## 2019-01-27 DIAGNOSIS — M50923 Unspecified cervical disc disorder at C6-C7 level: Secondary | ICD-10-CM | POA: Diagnosis not present

## 2019-01-27 DIAGNOSIS — M25511 Pain in right shoulder: Secondary | ICD-10-CM | POA: Diagnosis not present

## 2019-01-27 DIAGNOSIS — M5136 Other intervertebral disc degeneration, lumbar region: Secondary | ICD-10-CM | POA: Diagnosis not present

## 2019-01-27 DIAGNOSIS — M50922 Unspecified cervical disc disorder at C5-C6 level: Secondary | ICD-10-CM | POA: Diagnosis not present

## 2019-01-28 ENCOUNTER — Ambulatory Visit: Payer: BC Managed Care – PPO | Admitting: Family Medicine

## 2019-01-31 DIAGNOSIS — F901 Attention-deficit hyperactivity disorder, predominantly hyperactive type: Secondary | ICD-10-CM | POA: Diagnosis not present

## 2019-01-31 DIAGNOSIS — M50923 Unspecified cervical disc disorder at C6-C7 level: Secondary | ICD-10-CM | POA: Diagnosis not present

## 2019-01-31 DIAGNOSIS — F429 Obsessive-compulsive disorder, unspecified: Secondary | ICD-10-CM | POA: Diagnosis not present

## 2019-01-31 DIAGNOSIS — M5136 Other intervertebral disc degeneration, lumbar region: Secondary | ICD-10-CM | POA: Diagnosis not present

## 2019-01-31 DIAGNOSIS — M50922 Unspecified cervical disc disorder at C5-C6 level: Secondary | ICD-10-CM | POA: Diagnosis not present

## 2019-01-31 DIAGNOSIS — M25511 Pain in right shoulder: Secondary | ICD-10-CM | POA: Diagnosis not present

## 2019-02-01 ENCOUNTER — Ambulatory Visit (INDEPENDENT_AMBULATORY_CARE_PROVIDER_SITE_OTHER): Payer: BC Managed Care – PPO | Admitting: Family Medicine

## 2019-02-01 ENCOUNTER — Encounter: Payer: Self-pay | Admitting: Family Medicine

## 2019-02-01 ENCOUNTER — Other Ambulatory Visit: Payer: Self-pay

## 2019-02-01 VITALS — BP 122/60 | HR 68 | Temp 97.9°F | Ht 74.0 in | Wt 223.9 lb

## 2019-02-01 DIAGNOSIS — R253 Fasciculation: Secondary | ICD-10-CM

## 2019-02-01 NOTE — Progress Notes (Signed)
  Subjective:     Patient ID: Peter Snyder, male   DOB: 1955/09/12, 63 y.o.   MRN: [id number]  [place] is seen with over couple weeks now of what sounds like benign muscle fasciculations mostly in the calf muscles.  He was seen here on the second of this month and then by video follow-up virtual visit on the 11th.  He was diagnosed with muscle "cramps ".  However, he is describing what sounds like nonpainful fasciculations.  He describes this as a muscle "twitch "which is nonpainful.  He is walking over 4 miles most days per week at a brisk pace with no difficulties.  No claudication symptoms.  No weakness.  No numbness.  He started taking some magnesium at suggestion from prior visit but had some diarrhea symptoms.  He does have history of some intermittent muscle cramps but not describing any muscle cramps lately.  Past Medical History:  Diagnosis Date  . Arthritis   . CAD (coronary artery disease) 6/14   Cardiac catheterization 6/27/ 2014 ejection fraction 35-40%, 30% proximal left circumflex, 100% tiny obtuse marginal 1 with collaterals, 50% LAD, 50% D1, 100% RCA with collaterals  . Chicken pox   . Colon polyps   . Depression   . Diabetes mellitus ([place])    Diet controlled  . GERD (gastroesophageal reflux disease)   . Hyperlipidemia   . Hypertension   . Kidney stone    Past Surgical History:  Procedure Laterality Date  . TONSILLECTOMY  1963  . TRIGGER FINGER RELEASE  2019    reports that he quit smoking about 16 years ago. He has never used smokeless tobacco. He reports that he does not drink alcohol or use drugs. family history includes Arthritis in his father and paternal grandmother; Clotting disorder in his paternal grandfather and paternal grandmother; Colon polyps in his father; Heart disease in his maternal grandmother; Hyperlipidemia in his maternal grandmother; Liver cancer in his paternal grandfather; Melanoma in his mother. Allergies  Allergen Reactions  . Latex Other  (See Comments)    Other  . Sulfa Antibiotics Rash     Review of Systems  Constitutional: Negative for chills and fever.  Respiratory: Negative for shortness of breath.   Cardiovascular: Negative for chest pain.  Neurological: Negative for weakness and numbness.       Objective:   Physical Exam Vitals signs reviewed.  Constitutional:      Appearance: Normal appearance.  Cardiovascular:     Rate and Rhythm: Normal rate and regular rhythm.  Pulmonary:     Effort: Pulmonary effort is normal.     Breath sounds: Normal breath sounds.  Musculoskeletal:     Comments: No significant leg edema.  He does have contusion left calf area with very small hematoma but nontender. Distal foot pulses are normal.  Neurological:     Mental Status: He is alert.     Comments: Full strength with plantarflexion and dorsiflexion bilaterally.  Symmetric reflexes lower extremities        Assessment:     He is describing likely benign muscle fasciculations involving calf muscles    Plan:     -Reassurance -Continue regular walking and exercise habits -Stressed importance of good hydration -He has scheduled physical in December and reassess then  [name] MD [facility]

## 2019-02-01 NOTE — Patient Instructions (Signed)
Your symptoms sound like benign muscle fasciculations  Keep up your walking and stay well hydrated  Suspect these will go away over next couple of weeks.

## 2019-02-02 DIAGNOSIS — L570 Actinic keratosis: Secondary | ICD-10-CM | POA: Diagnosis not present

## 2019-02-02 DIAGNOSIS — D485 Neoplasm of uncertain behavior of skin: Secondary | ICD-10-CM | POA: Diagnosis not present

## 2019-02-02 DIAGNOSIS — Z23 Encounter for immunization: Secondary | ICD-10-CM | POA: Diagnosis not present

## 2019-02-02 DIAGNOSIS — L820 Inflamed seborrheic keratosis: Secondary | ICD-10-CM | POA: Diagnosis not present

## 2019-02-03 DIAGNOSIS — M5136 Other intervertebral disc degeneration, lumbar region: Secondary | ICD-10-CM | POA: Diagnosis not present

## 2019-02-03 DIAGNOSIS — M50923 Unspecified cervical disc disorder at C6-C7 level: Secondary | ICD-10-CM | POA: Diagnosis not present

## 2019-02-03 DIAGNOSIS — M25511 Pain in right shoulder: Secondary | ICD-10-CM | POA: Diagnosis not present

## 2019-02-03 DIAGNOSIS — M50922 Unspecified cervical disc disorder at C5-C6 level: Secondary | ICD-10-CM | POA: Diagnosis not present

## 2019-02-07 DIAGNOSIS — M50922 Unspecified cervical disc disorder at C5-C6 level: Secondary | ICD-10-CM | POA: Diagnosis not present

## 2019-02-07 DIAGNOSIS — M25511 Pain in right shoulder: Secondary | ICD-10-CM | POA: Diagnosis not present

## 2019-02-07 DIAGNOSIS — M50923 Unspecified cervical disc disorder at C6-C7 level: Secondary | ICD-10-CM | POA: Diagnosis not present

## 2019-02-07 DIAGNOSIS — M5136 Other intervertebral disc degeneration, lumbar region: Secondary | ICD-10-CM | POA: Diagnosis not present

## 2019-02-09 ENCOUNTER — Encounter: Payer: Self-pay | Admitting: Family Medicine

## 2019-02-09 ENCOUNTER — Telehealth: Payer: Self-pay | Admitting: Family Medicine

## 2019-02-09 ENCOUNTER — Ambulatory Visit (INDEPENDENT_AMBULATORY_CARE_PROVIDER_SITE_OTHER): Payer: BC Managed Care – PPO | Admitting: Family Medicine

## 2019-02-09 ENCOUNTER — Other Ambulatory Visit: Payer: Self-pay

## 2019-02-09 VITALS — BP 124/82 | HR 68 | Temp 97.3°F | Ht 74.0 in | Wt 223.1 lb

## 2019-02-09 DIAGNOSIS — R195 Other fecal abnormalities: Secondary | ICD-10-CM

## 2019-02-09 MED ORDER — CILIDINIUM-CHLORDIAZEPOXIDE 2.5-5 MG PO CAPS: 1.0000 | ORAL_CAPSULE | Freq: Three times a day (TID) | ORAL | 0 refills | Status: DC

## 2019-02-09 NOTE — Telephone Encounter (Signed)
Patient made an appointment in office today.

## 2019-02-09 NOTE — Telephone Encounter (Signed)
Copied from [place] [id number]. Topic: General - Other >> Feb 09, 2019 10:18 AM Keene Breath wrote: Reason for CRM: Patient called to ask the nurse what he can do for his loose bowels since he was taken off of the magnesium by the doctor.  Please advise and call patient at [phone]

## 2019-02-09 NOTE — Telephone Encounter (Signed)
Not sure how severe diarrhea is, but if several times daily and no fever or bloody stools could try some OTC Imodium.

## 2019-02-09 NOTE — Progress Notes (Signed)
  Subjective:     Patient ID: Peter Snyder, male   DOB: 05/30/1955, 63 y.o.   MRN: [id number]  HPI   Dai complains of some loose stools.  He was taking some magnesium recently for possible muscle cramps but stopped this last Tuesday.  He is only having about 1 and sometimes 2 loose nonwatery stools per day.  He has had some increased stress issues recently and wonders if some of this may be stress related.  He has had similar issues in the past and has taken Librax previously which seemed to help.  He denies any bloody stools.  No abdominal pain.  No nausea or vomiting.  No body aches or fever.  No respiratory symptoms such as nasal congestion or cough.  Still exercising regularly without difficulty.  Colonoscopy is up-to-date with most recent colonoscopy 01/06/2017.  Past Medical History:  Diagnosis Date  . Arthritis   . CAD (coronary artery disease) 6/14   Cardiac catheterization 6/27/ 2014 ejection fraction 35-40%, 30% proximal left circumflex, 100% tiny obtuse marginal 1 with collaterals, 50% LAD, 50% D1, 100% RCA with collaterals  . Chicken pox   . Colon polyps   . Depression   . Diabetes mellitus ([place])    Diet controlled  . GERD (gastroesophageal reflux disease)   . Hyperlipidemia   . Hypertension   . Kidney stone    Past Surgical History:  Procedure Laterality Date  . TONSILLECTOMY  1963  . TRIGGER FINGER RELEASE  2019    reports that he quit smoking about 16 years ago. He has never used smokeless tobacco. He reports that he does not drink alcohol or use drugs. family history includes Arthritis in his father and paternal grandmother; Clotting disorder in his paternal grandfather and paternal grandmother; Colon polyps in his father; Heart disease in his maternal grandmother; Hyperlipidemia in his maternal grandmother; Liver cancer in his paternal grandfather; Melanoma in his mother. Allergies  Allergen Reactions  . Latex Other (See Comments)    Other  . Sulfa Antibiotics  Rash  ]   Review of Systems  Constitutional: Negative for chills and fever.  Cardiovascular: Negative for chest pain.  Gastrointestinal: Positive for diarrhea. Negative for abdominal pain, blood in stool, nausea and vomiting.       Objective:   Physical Exam Vitals signs reviewed.  Constitutional:      Appearance: Normal appearance.  Cardiovascular:     Rate and Rhythm: Normal rate and regular rhythm.  Pulmonary:     Breath sounds: Normal breath sounds.  Abdominal:     General: There is no distension.     Palpations: Abdomen is soft.     Tenderness: There is no abdominal tenderness. There is no guarding or rebound.  Neurological:     Mental Status: He is alert.        Assessment:     Loose stools.  Not sure this is related to his recent magnesium as he has not taken any for a week now.  Does not have any red flags such as bloody stools, fever, abdominal pain    Plan:     -Discussed dietary factors- foods to avoid with loose stools/diarrhea -Agreed to refill Librax for short-term as needed use -Touch base for any persistent or worsening diarrhea  [name] MD [facility]

## 2019-02-09 NOTE — Patient Instructions (Signed)
Food Choices to Help Relieve Diarrhea, Adult When you have diarrhea, the foods you eat and your eating habits are very important. Choosing the right foods and drinks can help:  Relieve diarrhea.  Replace lost fluids and nutrients.  Prevent dehydration. What general guidelines should I follow?  Relieving diarrhea  Choose foods with less than 2 g or .07 oz. of fiber per serving.  Limit fats to less than 8 tsp (38 g or 1.34 oz.) a day.  Avoid the following: ? Foods and beverages sweetened with high-fructose corn syrup, honey, or sugar alcohols such as xylitol, sorbitol, and mannitol. ? Foods that contain a lot of fat or sugar. ? Fried, greasy, or spicy foods. ? High-fiber grains, breads, and cereals. ? Raw fruits and vegetables.  Eat foods that are rich in probiotics. These foods include dairy products such as yogurt and fermented milk products. They help increase healthy bacteria in the stomach and intestines (gastrointestinal tract, or GI tract).  If you have lactose intolerance, avoid dairy products. These may make your diarrhea worse.  Take medicine to help stop diarrhea (antidiarrheal medicine) only as told by your health care provider. Replacing nutrients  Eat small meals or snacks every 3-4 hours.  Eat bland foods, such as white rice, toast, or baked potato, until your diarrhea starts to get better. Gradually reintroduce nutrient-rich foods as tolerated or as told by your health care provider. This includes: ? Well-cooked protein foods. ? Peeled, seeded, and soft-cooked fruits and vegetables. ? Low-fat dairy products.  Take vitamin and mineral supplements as told by your health care provider. Preventing dehydration  Start by sipping water or a special solution to prevent dehydration (oral rehydration solution, ORS). Urine that is clear or pale yellow means that you are getting enough fluid.  Try to drink at least 8-10 cups of fluid each day to help replace lost  fluids.  You may add other liquids in addition to water, such as clear juice or decaffeinated sports drinks, as tolerated or as told by your health care provider.  Avoid drinks with caffeine, such as coffee, tea, or soft drinks.  Avoid alcohol. What foods are recommended?     The items listed may not be a complete list. Talk with your health care provider about what dietary choices are best for you. Grains White rice. White, French, or pita breads (fresh or toasted), including plain rolls, buns, or bagels. White pasta. Saltine, soda, or graham crackers. Pretzels. Low-fiber cereal. Cooked cereals made with water (such as cornmeal, farina, or cream cereals). Plain muffins. Matzo. Melba toast. Zwieback. Vegetables Potatoes (without the skin). Most well-cooked and canned vegetables without skins or seeds. Tender lettuce. Fruits Apple sauce. Fruits canned in juice. Cooked apricots, cherries, grapefruit, peaches, pears, or plums. Fresh bananas and cantaloupe. Meats and other protein foods Baked or boiled chicken. Eggs. Tofu. Fish. Seafood. Smooth nut butters. Ground or well-cooked tender beef, ham, veal, lamb, pork, or poultry. Dairy Plain yogurt, kefir, and unsweetened liquid yogurt. Lactose-free milk, buttermilk, skim milk, or soy milk. Low-fat or nonfat hard cheese. Beverages Water. Low-calorie sports drinks. Fruit juices without pulp. Strained tomato and vegetable juices. Decaffeinated teas. Sugar-free beverages not sweetened with sugar alcohols. Oral rehydration solutions, if approved by your health care provider. Seasoning and other foods Bouillon, broth, or soups made from recommended foods. What foods are not recommended? The items listed may not be a complete list. Talk with your health care provider about what dietary choices are best for you. Grains Whole   grain, whole wheat, bran, or rye breads, rolls, pastas, and crackers. Wild or brown rice. Whole grain or bran cereals. Barley.  Oats and oatmeal. Corn tortillas or taco shells. Granola. Popcorn. Vegetables Raw vegetables. Fried vegetables. Cabbage, broccoli, Brussels sprouts, artichokes, baked beans, beet greens, corn, kale, legumes, peas, sweet potatoes, and yams. Potato skins. Cooked spinach and cabbage. Fruits Dried fruit, including raisins and dates. Raw fruits. Stewed or dried prunes. Canned fruits with syrup. Meat and other protein foods Fried or fatty meats. Deli meats. Chunky nut butters. Nuts and seeds. Beans and lentils. Bacon. Hot dogs. Sausage. Dairy High-fat cheeses. Whole milk, chocolate milk, and beverages made with milk, such as milk shakes. Half-and-half. Cream. sour cream. Ice cream. Beverages Caffeinated beverages (such as coffee, tea, soda, or energy drinks). Alcoholic beverages. Fruit juices with pulp. Prune juice. Soft drinks sweetened with high-fructose corn syrup or sugar alcohols. High-calorie sports drinks. Fats and oils Butter. Cream sauces. Margarine. Salad oils. Plain salad dressings. Olives. Avocados. Mayonnaise. Sweets and desserts Sweet rolls, doughnuts, and sweet breads. Sugar-free desserts sweetened with sugar alcohols such as xylitol and sorbitol. Seasoning and other foods Honey. Hot sauce. Chili powder. Gravy. Cream-based or milk-based soups. Pancakes and waffles. Summary  When you have diarrhea, the foods you eat and your eating habits are very important.  Make sure you get at least 8-10 cups of fluid each day, or enough to keep your urine clear or pale yellow.  Eat bland foods and gradually reintroduce healthy, nutrient-rich foods as tolerated, or as told by your health care provider.  Avoid high-fiber, fried, greasy, or spicy foods. This information is not intended to replace advice given to you by your health care provider. Make sure you discuss any questions you have with your health care provider. Document Released: 05/24/2003 Document Revised: 06/24/2018 Document Reviewed:  02/29/2016 Elsevier Patient Education  2020 Else[employer] Inc.  

## 2019-02-09 NOTE — Telephone Encounter (Signed)
Please advise 

## 2019-02-12 DIAGNOSIS — R0989 Other specified symptoms and signs involving the circulatory and respiratory systems: Secondary | ICD-10-CM

## 2019-02-12 DIAGNOSIS — M545 Low back pain: Secondary | ICD-10-CM | POA: Diagnosis not present

## 2019-02-14 DIAGNOSIS — M5416 Radiculopathy, lumbar region: Secondary | ICD-10-CM | POA: Diagnosis not present

## 2019-02-14 NOTE — Telephone Encounter (Signed)
Schedule abdominal ultrasound to exclude AAA. Follow-up MRA of thoracic aorta as outlined in previous note March 2021.  Kirk Ruths

## 2019-02-15 DIAGNOSIS — M5136 Other intervertebral disc degeneration, lumbar region: Secondary | ICD-10-CM | POA: Diagnosis not present

## 2019-02-15 DIAGNOSIS — M50922 Unspecified cervical disc disorder at C5-C6 level: Secondary | ICD-10-CM | POA: Diagnosis not present

## 2019-02-15 DIAGNOSIS — M25511 Pain in right shoulder: Secondary | ICD-10-CM | POA: Diagnosis not present

## 2019-02-15 DIAGNOSIS — M50923 Unspecified cervical disc disorder at C6-C7 level: Secondary | ICD-10-CM | POA: Diagnosis not present

## 2019-02-17 DIAGNOSIS — M50922 Unspecified cervical disc disorder at C5-C6 level: Secondary | ICD-10-CM | POA: Diagnosis not present

## 2019-02-17 DIAGNOSIS — M50923 Unspecified cervical disc disorder at C6-C7 level: Secondary | ICD-10-CM | POA: Diagnosis not present

## 2019-02-17 DIAGNOSIS — M5136 Other intervertebral disc degeneration, lumbar region: Secondary | ICD-10-CM | POA: Diagnosis not present

## 2019-02-17 DIAGNOSIS — M25511 Pain in right shoulder: Secondary | ICD-10-CM | POA: Diagnosis not present

## 2019-02-21 DIAGNOSIS — M25511 Pain in right shoulder: Secondary | ICD-10-CM | POA: Diagnosis not present

## 2019-02-21 DIAGNOSIS — M50922 Unspecified cervical disc disorder at C5-C6 level: Secondary | ICD-10-CM | POA: Diagnosis not present

## 2019-02-21 DIAGNOSIS — F901 Attention-deficit hyperactivity disorder, predominantly hyperactive type: Secondary | ICD-10-CM | POA: Diagnosis not present

## 2019-02-21 DIAGNOSIS — F429 Obsessive-compulsive disorder, unspecified: Secondary | ICD-10-CM | POA: Diagnosis not present

## 2019-02-21 DIAGNOSIS — M5136 Other intervertebral disc degeneration, lumbar region: Secondary | ICD-10-CM | POA: Diagnosis not present

## 2019-02-21 DIAGNOSIS — M50923 Unspecified cervical disc disorder at C6-C7 level: Secondary | ICD-10-CM | POA: Diagnosis not present

## 2019-02-22 ENCOUNTER — Ambulatory Visit (HOSPITAL_COMMUNITY)
Admission: RE | Admit: 2019-02-22 | Discharge: 2019-02-22 | Disposition: A | Discharge status: Home / Self Care | Payer: BC Managed Care – PPO | Source: Ambulatory Visit | Attending: Cardiovascular Disease | Admitting: Cardiovascular Disease

## 2019-02-22 ENCOUNTER — Other Ambulatory Visit: Payer: Self-pay

## 2019-02-22 DIAGNOSIS — R0989 Other specified symptoms and signs involving the circulatory and respiratory systems: Secondary | ICD-10-CM | POA: Diagnosis not present

## 2019-02-22 DIAGNOSIS — M5416 Radiculopathy, lumbar region: Secondary | ICD-10-CM | POA: Diagnosis not present

## 2019-02-22 DIAGNOSIS — M5136 Other intervertebral disc degeneration, lumbar region: Secondary | ICD-10-CM | POA: Diagnosis not present

## 2019-02-22 DIAGNOSIS — M5030 Other cervical disc degeneration, unspecified cervical region: Secondary | ICD-10-CM | POA: Diagnosis not present

## 2019-02-24 DIAGNOSIS — M5136 Other intervertebral disc degeneration, lumbar region: Secondary | ICD-10-CM | POA: Diagnosis not present

## 2019-02-24 DIAGNOSIS — M50923 Unspecified cervical disc disorder at C6-C7 level: Secondary | ICD-10-CM | POA: Diagnosis not present

## 2019-02-24 DIAGNOSIS — M50922 Unspecified cervical disc disorder at C5-C6 level: Secondary | ICD-10-CM | POA: Diagnosis not present

## 2019-02-24 DIAGNOSIS — M25511 Pain in right shoulder: Secondary | ICD-10-CM | POA: Diagnosis not present

## 2019-02-25 DIAGNOSIS — M5416 Radiculopathy, lumbar region: Secondary | ICD-10-CM | POA: Diagnosis not present

## 2019-02-28 ENCOUNTER — Other Ambulatory Visit: Payer: Self-pay | Admitting: Family Medicine

## 2019-02-28 DIAGNOSIS — M50922 Unspecified cervical disc disorder at C5-C6 level: Secondary | ICD-10-CM | POA: Diagnosis not present

## 2019-02-28 DIAGNOSIS — M50923 Unspecified cervical disc disorder at C6-C7 level: Secondary | ICD-10-CM | POA: Diagnosis not present

## 2019-02-28 DIAGNOSIS — F901 Attention-deficit hyperactivity disorder, predominantly hyperactive type: Secondary | ICD-10-CM | POA: Diagnosis not present

## 2019-02-28 DIAGNOSIS — M5136 Other intervertebral disc degeneration, lumbar region: Secondary | ICD-10-CM | POA: Diagnosis not present

## 2019-02-28 DIAGNOSIS — M25511 Pain in right shoulder: Secondary | ICD-10-CM | POA: Diagnosis not present

## 2019-02-28 DIAGNOSIS — F429 Obsessive-compulsive disorder, unspecified: Secondary | ICD-10-CM | POA: Diagnosis not present

## 2019-02-28 NOTE — Telephone Encounter (Signed)
Refill okay?  

## 2019-02-28 NOTE — Telephone Encounter (Signed)
Patient requesting refill. Not on medication list. OK to fill?

## 2019-03-01 DIAGNOSIS — M7052 Other bursitis of knee, left knee: Secondary | ICD-10-CM | POA: Diagnosis not present

## 2019-03-01 DIAGNOSIS — M25562 Pain in left knee: Secondary | ICD-10-CM | POA: Diagnosis not present

## 2019-03-03 DIAGNOSIS — M50922 Unspecified cervical disc disorder at C5-C6 level: Secondary | ICD-10-CM | POA: Diagnosis not present

## 2019-03-03 DIAGNOSIS — M50923 Unspecified cervical disc disorder at C6-C7 level: Secondary | ICD-10-CM | POA: Diagnosis not present

## 2019-03-03 DIAGNOSIS — M5136 Other intervertebral disc degeneration, lumbar region: Secondary | ICD-10-CM | POA: Diagnosis not present

## 2019-03-03 DIAGNOSIS — M25511 Pain in right shoulder: Secondary | ICD-10-CM | POA: Diagnosis not present

## 2019-03-07 DIAGNOSIS — M50922 Unspecified cervical disc disorder at C5-C6 level: Secondary | ICD-10-CM | POA: Diagnosis not present

## 2019-03-07 DIAGNOSIS — M25511 Pain in right shoulder: Secondary | ICD-10-CM | POA: Diagnosis not present

## 2019-03-07 DIAGNOSIS — M5136 Other intervertebral disc degeneration, lumbar region: Secondary | ICD-10-CM | POA: Diagnosis not present

## 2019-03-07 DIAGNOSIS — M50923 Unspecified cervical disc disorder at C6-C7 level: Secondary | ICD-10-CM | POA: Diagnosis not present

## 2019-03-08 DIAGNOSIS — M5136 Other intervertebral disc degeneration, lumbar region: Secondary | ICD-10-CM | POA: Diagnosis not present

## 2019-03-08 DIAGNOSIS — M5416 Radiculopathy, lumbar region: Secondary | ICD-10-CM | POA: Diagnosis not present

## 2019-03-09 DIAGNOSIS — M50923 Unspecified cervical disc disorder at C6-C7 level: Secondary | ICD-10-CM | POA: Diagnosis not present

## 2019-03-09 DIAGNOSIS — M25511 Pain in right shoulder: Secondary | ICD-10-CM | POA: Diagnosis not present

## 2019-03-09 DIAGNOSIS — M5136 Other intervertebral disc degeneration, lumbar region: Secondary | ICD-10-CM | POA: Diagnosis not present

## 2019-03-09 DIAGNOSIS — M50922 Unspecified cervical disc disorder at C5-C6 level: Secondary | ICD-10-CM | POA: Diagnosis not present

## 2019-03-14 DIAGNOSIS — M50922 Unspecified cervical disc disorder at C5-C6 level: Secondary | ICD-10-CM | POA: Diagnosis not present

## 2019-03-14 DIAGNOSIS — M25511 Pain in right shoulder: Secondary | ICD-10-CM | POA: Diagnosis not present

## 2019-03-14 DIAGNOSIS — M50923 Unspecified cervical disc disorder at C6-C7 level: Secondary | ICD-10-CM | POA: Diagnosis not present

## 2019-03-14 DIAGNOSIS — M5136 Other intervertebral disc degeneration, lumbar region: Secondary | ICD-10-CM | POA: Diagnosis not present

## 2019-03-15 ENCOUNTER — Encounter: Payer: Self-pay | Admitting: Family Medicine

## 2019-03-15 ENCOUNTER — Other Ambulatory Visit: Payer: Self-pay

## 2019-03-15 ENCOUNTER — Ambulatory Visit (INDEPENDENT_AMBULATORY_CARE_PROVIDER_SITE_OTHER): Payer: BC Managed Care – PPO | Admitting: Family Medicine

## 2019-03-15 VITALS — BP 118/70 | HR 78 | Temp 97.3°F | Ht 72.5 in | Wt 218.6 lb

## 2019-03-15 DIAGNOSIS — Z0000 Encounter for general adult medical examination without abnormal findings: Secondary | ICD-10-CM | POA: Diagnosis not present

## 2019-03-15 DIAGNOSIS — Z125 Encounter for screening for malignant neoplasm of prostate: Secondary | ICD-10-CM | POA: Diagnosis not present

## 2019-03-15 LAB — BASIC METABOLIC PANEL
BUN: 14 mg/dL (ref 6–23)
CO2: 29 mEq/L (ref 19–32)
Calcium: 9.4 mg/dL (ref 8.4–10.5)
Chloride: 101 mEq/L (ref 96–112)
Creatinine, Ser: 1.02 mg/dL (ref 0.40–1.50)
GFR: 73.57 mL/min (ref >60.00)
Glucose, Bld: 134 mg/dL — ABNORMAL HIGH (ref 70–99)
Potassium: 4.4 mEq/L (ref 3.5–5.1)
Sodium: 137 mEq/L (ref 135–145)

## 2019-03-15 LAB — HEPATIC FUNCTION PANEL
ALT: 16 U/L (ref 0–53)
AST: 17 U/L (ref 0–37)
Albumin: 4.4 g/dL (ref 3.5–5.2)
Alkaline Phosphatase: 58 U/L (ref 39–117)
Bilirubin, Direct: 0.1 mg/dL (ref 0.0–0.3)
Total Bilirubin: 0.5 mg/dL (ref 0.2–1.2)
Total Protein: 6.7 g/dL (ref 6.0–8.3)

## 2019-03-15 LAB — LIPID PANEL
Cholesterol: 160 mg/dL (ref 0–200)
HDL: 51.7 mg/dL (ref >39.00)
LDL Cholesterol: 86 mg/dL (ref 0–99)
NonHDL: 108.16
Total CHOL/HDL Ratio: 3
Triglycerides: 109 mg/dL (ref 0.0–149.0)
VLDL: 21.8 mg/dL (ref 0.0–40.0)

## 2019-03-15 LAB — CBC WITH DIFFERENTIAL/PLATELET
Basophils Absolute: 0 10*3/uL (ref 0.0–0.1)
Basophils Relative: 0.6 % (ref 0.0–3.0)
Eosinophils Absolute: 0.1 10*3/uL (ref 0.0–0.7)
Eosinophils Relative: 1 % (ref 0.0–5.0)
HCT: 39.8 % (ref 39.0–52.0)
Hemoglobin: 13.1 g/dL (ref 13.0–17.0)
Lymphocytes Relative: 22.7 % (ref 12.0–46.0)
Lymphs Abs: 1.8 10*3/uL (ref 0.7–4.0)
MCHC: 32.9 g/dL (ref 30.0–36.0)
MCV: 94.3 fl (ref 78.0–100.0)
Monocytes Absolute: 0.7 10*3/uL (ref 0.1–1.0)
Monocytes Relative: 8.4 % (ref 3.0–12.0)
Neutro Abs: 5.4 10*3/uL (ref 1.4–7.7)
Neutrophils Relative %: 67.3 % (ref 43.0–77.0)
Platelets: 141 10*3/uL — ABNORMAL LOW (ref 150.0–400.0)
RBC: 4.22 Mil/uL (ref 4.22–5.81)
RDW: 13 % (ref 11.5–15.5)
WBC: 8 10*3/uL (ref 4.0–10.5)

## 2019-03-15 LAB — PSA: PSA: 1.4 ng/mL (ref 0.10–4.00)

## 2019-03-15 LAB — TSH: TSH: 1.4 u[IU]/mL (ref 0.35–4.50)

## 2019-03-15 LAB — HEMOGLOBIN A1C: Hgb A1c MFr Bld: 6.6 % — ABNORMAL HIGH (ref 4.6–6.5)

## 2019-03-15 NOTE — Patient Instructions (Signed)
Preventive Care 41-63 Years Old, Male Preventive care refers to lifestyle choices and visits with your health care provider that can promote health and wellness. This includes:  A yearly physical exam. This is also called an annual well check.  Regular dental and eye exams.  Immunizations.  Screening for certain conditions.  Healthy lifestyle choices, such as eating a healthy diet, getting regular exercise, not using drugs or products that contain nicotine and tobacco, and limiting alcohol use. What can I expect for my preventive care visit? Physical exam Your health care provider will check:  Height and weight. These may be used to calculate body mass index (BMI), which is a measurement that tells if you are at a healthy weight.  Heart rate and blood pressure.  Your skin for abnormal spots. Counseling Your health care provider may ask you questions about:  Alcohol, tobacco, and drug use.  Emotional well-being.  Home and relationship well-being.  Sexual activity.  Eating habits.  Work and work Statistician. What immunizations do I need?  Influenza (flu) vaccine  This is recommended every year. Tetanus, diphtheria, and pertussis (Tdap) vaccine  You may need a Td booster every 10 years. Varicella (chickenpox) vaccine  You may need this vaccine if you have not already been vaccinated. Zoster (shingles) vaccine  You may need this after age 64. Measles, mumps, and rubella (MMR) vaccine  You may need at least one dose of MMR if you were born in 1957 or later. You may also need a second dose. Pneumococcal conjugate (PCV13) vaccine  You may need this if you have certain conditions and were not previously vaccinated. Pneumococcal polysaccharide (PPSV23) vaccine  You may need one or two doses if you smoke cigarettes or if you have certain conditions. Meningococcal conjugate (MenACWY) vaccine  You may need this if you have certain conditions. Hepatitis A  vaccine  You may need this if you have certain conditions or if you travel or work in places where you may be exposed to hepatitis A. Hepatitis B vaccine  You may need this if you have certain conditions or if you travel or work in places where you may be exposed to hepatitis B. Haemophilus influenzae type b (Hib) vaccine  You may need this if you have certain risk factors. Human papillomavirus (HPV) vaccine  If recommended by your health care provider, you may need three doses over 6 months. You may receive vaccines as individual doses or as more than one vaccine together in one shot (combination vaccines). Talk with your health care provider about the risks and benefits of combination vaccines. What tests do I need? Blood tests  Lipid and cholesterol levels. These may be checked every 5 years, or more frequently if you are over 60 years old.  Hepatitis C test.  Hepatitis B test. Screening  Lung cancer screening. You may have this screening every year starting at age 43 if you have a 30-pack-year history of smoking and currently smoke or have quit within the past 15 years.  Prostate cancer screening. Recommendations will vary depending on your family history and other risks.  Colorectal cancer screening. All adults should have this screening starting at age 72 and continuing until age 2. Your health care provider may recommend screening at age 14 if you are at increased risk. You will have tests every 1-10 years, depending on your results and the type of screening test.  Diabetes screening. This is done by checking your blood sugar (glucose) after you have not eaten  for a while (fasting). You may have this done every 1-3 years.  Sexually transmitted disease (STD) testing. Follow these instructions at home: Eating and drinking  Eat a diet that includes fresh fruits and vegetables, whole grains, lean protein, and low-fat dairy products.  Take vitamin and mineral supplements as recommended  by your health care provider.  Do not drink alcohol if your health care provider tells you not to drink.  If you drink alcohol: ? Limit how much you have to 0-2 drinks a day. ? Be aware of how much alcohol is in your drink. In the [place] one drink equals one 12 oz bottle of beer (355 mL), one 5 oz glass of wine (148 mL), or one 1 oz glass of hard liquor (44 mL). Lifestyle  Take daily care of your teeth and gums.  Stay active. Exercise for at least 30 minutes on 5 or more days each week.  Do not use any products that contain nicotine or tobacco, such as cigarettes, e-cigarettes, and chewing tobacco. If you need help quitting, ask your health care provider.  If you are sexually active, practice safe sex. Use a condom or other form of protection to prevent STIs (sexually transmitted infections).  Talk with your health care provider about taking a low-dose aspirin every day starting at age 50. What's next?  Go to your health care provider once a year for a well check visit.  Ask your health care provider how often you should have your eyes and teeth checked.  Stay up to date on all vaccines. This information is not intended to replace advice given to you by your health care provider. Make sure you discuss any questions you have with your health care provider. Document Released: 03/30/2015 Document Revised: 02/25/2018 Document Reviewed: 02/25/2018 Elsevier Patient Education  2020 Elsevier Inc.   

## 2019-03-15 NOTE — Progress Notes (Signed)
Subjective:     Patient ID: Peter Snyder, male   DOB: 1955/08/19, 63 y.o.   MRN: [id number]  [name] is seen for physical exam.  He has been recently battling some low back pain.  He went to urgent care and had plain x-rays and there was concern initially for possible enlarged aorta.  He ended up getting abdominal aortic aneurysm duplex scan which did not show any abdominal aneurysm.  His back pain is stable at this time.  He is resumed walking and is up to about 3 miles per day.  No recent chest pains.  He does have history of CAD.  Health maintenance reviewed  -Influenza vaccine already given -Previous hepatitis C screen negative -Tetanus due 2025 -Repeat colonoscopy due 10/21 -Pneumovax already given -He has received previous Zostavax and Shingrix vaccines  Past Medical History:  Diagnosis Date  . Arthritis   . CAD (coronary artery disease) 6/14   Cardiac catheterization 6/27/ 2014 ejection fraction 35-40%, 30% proximal left circumflex, 100% tiny obtuse marginal 1 with collaterals, 50% LAD, 50% D1, 100% RCA with collaterals  . Chicken pox   . Colon polyps   . Depression   . Diabetes mellitus (Peter Snyder)    Diet controlled  . GERD (gastroesophageal reflux disease)   . Hyperlipidemia   . Hypertension   . Kidney stone    Past Surgical History:  Procedure Laterality Date  . CRYO INTERCOSTAL NERVE BLOCK     double sacrum nerve block  . TONSILLECTOMY  1963  . TRIGGER FINGER RELEASE  2019    reports that he quit smoking about 16 years ago. He has never used smokeless tobacco. He reports that he does not drink alcohol or use drugs. family history includes Arthritis in his father and paternal grandmother; Clotting disorder in his paternal grandfather and paternal grandmother; Colon polyps in his father; Heart disease in his maternal grandmother; Hyperlipidemia in his maternal grandmother; Liver cancer in his paternal grandfather; Melanoma in his mother. Allergies  Allergen Reactions   . Latex Other (See Comments)    Other  . Sulfa Antibiotics Rash   Wt Readings from Last 3 Encounters:  03/15/19 218 lb 9.6 oz (99.2 kg)  02/09/19 223 lb 1.6 oz (101.2 kg)  02/01/19 223 lb 14.4 oz (101.6 kg)      Review of Systems  Constitutional: Negative for activity change, appetite change, chills, fatigue, fever and unexpected weight change.  HENT: Negative for congestion, ear pain and trouble swallowing.   Eyes: Negative for pain and visual disturbance.  Respiratory: Negative for cough, shortness of breath and wheezing.   Cardiovascular: Negative for chest pain and palpitations.  Gastrointestinal: Negative for abdominal distention, abdominal pain, blood in stool, constipation, diarrhea, nausea, rectal pain and vomiting.  Endocrine: Negative for polydipsia and polyuria.  Genitourinary: Negative for dysuria, hematuria and testicular pain.  Musculoskeletal: Positive for back pain. Negative for arthralgias and joint swelling.  Skin: Negative for rash.  Neurological: Negative for dizziness, syncope and headaches.  Hematological: Negative for adenopathy.  Psychiatric/Behavioral: Negative for confusion and dysphoric mood.       Objective:   Physical Exam Vitals reviewed.  Constitutional:      General: He is not in acute distress.    Appearance: He is well-developed.  HENT:     Head: Normocephalic and atraumatic.     Right Ear: External ear normal.     Left Ear: External ear normal.  Eyes:     Conjunctiva/sclera: Conjunctivae normal.  Pupils: Pupils are equal, round, and reactive to light.  Neck:     Thyroid: No thyromegaly.  Cardiovascular:     Rate and Rhythm: Normal rate and regular rhythm.     Heart sounds: Normal heart sounds. No murmur.  Pulmonary:     Effort: No respiratory distress.     Breath sounds: No wheezing or rales.  Abdominal:     General: Bowel sounds are normal. There is no distension.     Palpations: Abdomen is soft. There is no mass.      Tenderness: There is no abdominal tenderness. There is no guarding or rebound.  Musculoskeletal:     Cervical back: Normal range of motion and neck supple.     Right lower leg: No edema.     Left lower leg: No edema.  Lymphadenopathy:     Cervical: No cervical adenopathy.  Skin:    Findings: No rash.  Neurological:     Mental Status: He is alert and oriented to person, place, and time.     Cranial Nerves: No cranial nerve deficit.     Deep Tendon Reflexes: Reflexes normal.        Assessment:     Physical exam.  He has multiple chronic medical problems which are stable.  We addressed the following health maintenance issues    Plan:     -Vaccines up-to-date.  Continue with annual flu vaccine -We will need repeat colonoscopy next year -We recommend repeat Pneumovax at 65 -Continue regular exercise habits -Obtain follow-up screening lab work  [name] MD [facility]

## 2019-03-16 DIAGNOSIS — M5136 Other intervertebral disc degeneration, lumbar region: Secondary | ICD-10-CM | POA: Diagnosis not present

## 2019-03-16 DIAGNOSIS — M50923 Unspecified cervical disc disorder at C6-C7 level: Secondary | ICD-10-CM | POA: Diagnosis not present

## 2019-03-16 DIAGNOSIS — M25511 Pain in right shoulder: Secondary | ICD-10-CM | POA: Diagnosis not present

## 2019-03-16 DIAGNOSIS — M50922 Unspecified cervical disc disorder at C5-C6 level: Secondary | ICD-10-CM | POA: Diagnosis not present

## 2019-03-18 ENCOUNTER — Other Ambulatory Visit: Payer: Self-pay | Admitting: Family Medicine

## 2019-03-21 DIAGNOSIS — M25511 Pain in right shoulder: Secondary | ICD-10-CM | POA: Diagnosis not present

## 2019-03-21 DIAGNOSIS — M50922 Unspecified cervical disc disorder at C5-C6 level: Secondary | ICD-10-CM | POA: Diagnosis not present

## 2019-03-21 DIAGNOSIS — M50923 Unspecified cervical disc disorder at C6-C7 level: Secondary | ICD-10-CM | POA: Diagnosis not present

## 2019-03-21 DIAGNOSIS — M5136 Other intervertebral disc degeneration, lumbar region: Secondary | ICD-10-CM | POA: Diagnosis not present

## 2019-03-23 DIAGNOSIS — M50923 Unspecified cervical disc disorder at C6-C7 level: Secondary | ICD-10-CM | POA: Diagnosis not present

## 2019-03-23 DIAGNOSIS — M25511 Pain in right shoulder: Secondary | ICD-10-CM | POA: Diagnosis not present

## 2019-03-23 DIAGNOSIS — M50922 Unspecified cervical disc disorder at C5-C6 level: Secondary | ICD-10-CM | POA: Diagnosis not present

## 2019-03-23 DIAGNOSIS — M5136 Other intervertebral disc degeneration, lumbar region: Secondary | ICD-10-CM | POA: Diagnosis not present

## 2019-03-23 DIAGNOSIS — Z713 Dietary counseling and surveillance: Secondary | ICD-10-CM | POA: Diagnosis not present

## 2019-03-24 DIAGNOSIS — G5762 Lesion of plantar nerve, left lower limb: Secondary | ICD-10-CM | POA: Diagnosis not present

## 2019-03-24 DIAGNOSIS — M25571 Pain in right ankle and joints of right foot: Secondary | ICD-10-CM | POA: Diagnosis not present

## 2019-03-24 DIAGNOSIS — M7752 Other enthesopathy of left foot: Secondary | ICD-10-CM | POA: Diagnosis not present

## 2019-03-28 DIAGNOSIS — F901 Attention-deficit hyperactivity disorder, predominantly hyperactive type: Secondary | ICD-10-CM | POA: Diagnosis not present

## 2019-03-28 DIAGNOSIS — M50923 Unspecified cervical disc disorder at C6-C7 level: Secondary | ICD-10-CM | POA: Diagnosis not present

## 2019-03-28 DIAGNOSIS — M5136 Other intervertebral disc degeneration, lumbar region: Secondary | ICD-10-CM | POA: Diagnosis not present

## 2019-03-28 DIAGNOSIS — F429 Obsessive-compulsive disorder, unspecified: Secondary | ICD-10-CM | POA: Diagnosis not present

## 2019-03-28 DIAGNOSIS — M25511 Pain in right shoulder: Secondary | ICD-10-CM | POA: Diagnosis not present

## 2019-03-28 DIAGNOSIS — M50922 Unspecified cervical disc disorder at C5-C6 level: Secondary | ICD-10-CM | POA: Diagnosis not present

## 2019-03-30 DIAGNOSIS — M25511 Pain in right shoulder: Secondary | ICD-10-CM | POA: Diagnosis not present

## 2019-03-30 DIAGNOSIS — M5136 Other intervertebral disc degeneration, lumbar region: Secondary | ICD-10-CM | POA: Diagnosis not present

## 2019-03-30 DIAGNOSIS — M50922 Unspecified cervical disc disorder at C5-C6 level: Secondary | ICD-10-CM | POA: Diagnosis not present

## 2019-03-30 DIAGNOSIS — M50923 Unspecified cervical disc disorder at C6-C7 level: Secondary | ICD-10-CM | POA: Diagnosis not present

## 2019-04-01 ENCOUNTER — Encounter: Payer: Self-pay | Admitting: Family Medicine

## 2019-04-04 ENCOUNTER — Encounter: Payer: Self-pay | Admitting: Family Medicine

## 2019-04-04 DIAGNOSIS — M5136 Other intervertebral disc degeneration, lumbar region: Secondary | ICD-10-CM | POA: Diagnosis not present

## 2019-04-04 DIAGNOSIS — M50923 Unspecified cervical disc disorder at C6-C7 level: Secondary | ICD-10-CM | POA: Diagnosis not present

## 2019-04-04 DIAGNOSIS — M50922 Unspecified cervical disc disorder at C5-C6 level: Secondary | ICD-10-CM | POA: Diagnosis not present

## 2019-04-04 DIAGNOSIS — M25511 Pain in right shoulder: Secondary | ICD-10-CM | POA: Diagnosis not present

## 2019-04-07 DIAGNOSIS — M5136 Other intervertebral disc degeneration, lumbar region: Secondary | ICD-10-CM | POA: Diagnosis not present

## 2019-04-07 DIAGNOSIS — M50923 Unspecified cervical disc disorder at C6-C7 level: Secondary | ICD-10-CM | POA: Diagnosis not present

## 2019-04-07 DIAGNOSIS — M25511 Pain in right shoulder: Secondary | ICD-10-CM | POA: Diagnosis not present

## 2019-04-07 DIAGNOSIS — M50922 Unspecified cervical disc disorder at C5-C6 level: Secondary | ICD-10-CM | POA: Diagnosis not present

## 2019-04-11 DIAGNOSIS — M25511 Pain in right shoulder: Secondary | ICD-10-CM | POA: Diagnosis not present

## 2019-04-11 DIAGNOSIS — M5136 Other intervertebral disc degeneration, lumbar region: Secondary | ICD-10-CM | POA: Diagnosis not present

## 2019-04-11 DIAGNOSIS — M50923 Unspecified cervical disc disorder at C6-C7 level: Secondary | ICD-10-CM | POA: Diagnosis not present

## 2019-04-11 DIAGNOSIS — M50922 Unspecified cervical disc disorder at C5-C6 level: Secondary | ICD-10-CM | POA: Diagnosis not present

## 2019-04-13 DIAGNOSIS — L299 Pruritus, unspecified: Secondary | ICD-10-CM | POA: Diagnosis not present

## 2019-04-13 DIAGNOSIS — L570 Actinic keratosis: Secondary | ICD-10-CM | POA: Diagnosis not present

## 2019-04-13 DIAGNOSIS — L739 Follicular disorder, unspecified: Secondary | ICD-10-CM | POA: Diagnosis not present

## 2019-04-13 DIAGNOSIS — B078 Other viral warts: Secondary | ICD-10-CM | POA: Diagnosis not present

## 2019-04-13 DIAGNOSIS — L820 Inflamed seborrheic keratosis: Secondary | ICD-10-CM | POA: Diagnosis not present

## 2019-04-14 ENCOUNTER — Ambulatory Visit: Payer: BC Managed Care – PPO

## 2019-04-14 DIAGNOSIS — M50922 Unspecified cervical disc disorder at C5-C6 level: Secondary | ICD-10-CM | POA: Diagnosis not present

## 2019-04-14 DIAGNOSIS — M50923 Unspecified cervical disc disorder at C6-C7 level: Secondary | ICD-10-CM | POA: Diagnosis not present

## 2019-04-14 DIAGNOSIS — M5136 Other intervertebral disc degeneration, lumbar region: Secondary | ICD-10-CM | POA: Diagnosis not present

## 2019-04-14 DIAGNOSIS — M25511 Pain in right shoulder: Secondary | ICD-10-CM | POA: Diagnosis not present

## 2019-04-15 ENCOUNTER — Ambulatory Visit: Payer: BC Managed Care – PPO

## 2019-04-18 ENCOUNTER — Other Ambulatory Visit: Payer: Self-pay

## 2019-04-18 ENCOUNTER — Encounter: Payer: Self-pay | Admitting: Family Medicine

## 2019-04-18 MED ORDER — PANTOPRAZOLE SODIUM 40 MG PO TBEC: DELAYED_RELEASE_TABLET | ORAL | 3 refills | Status: DC

## 2019-04-19 DIAGNOSIS — M25511 Pain in right shoulder: Secondary | ICD-10-CM | POA: Diagnosis not present

## 2019-04-19 DIAGNOSIS — M50923 Unspecified cervical disc disorder at C6-C7 level: Secondary | ICD-10-CM | POA: Diagnosis not present

## 2019-04-19 DIAGNOSIS — M5136 Other intervertebral disc degeneration, lumbar region: Secondary | ICD-10-CM | POA: Diagnosis not present

## 2019-04-19 DIAGNOSIS — M50922 Unspecified cervical disc disorder at C5-C6 level: Secondary | ICD-10-CM | POA: Diagnosis not present

## 2019-04-20 DIAGNOSIS — H1045 Other chronic allergic conjunctivitis: Secondary | ICD-10-CM | POA: Diagnosis not present

## 2019-04-21 DIAGNOSIS — M5136 Other intervertebral disc degeneration, lumbar region: Secondary | ICD-10-CM | POA: Diagnosis not present

## 2019-04-21 DIAGNOSIS — M50923 Unspecified cervical disc disorder at C6-C7 level: Secondary | ICD-10-CM | POA: Diagnosis not present

## 2019-04-21 DIAGNOSIS — M50922 Unspecified cervical disc disorder at C5-C6 level: Secondary | ICD-10-CM | POA: Diagnosis not present

## 2019-04-21 DIAGNOSIS — M25511 Pain in right shoulder: Secondary | ICD-10-CM | POA: Diagnosis not present

## 2019-04-22 ENCOUNTER — Ambulatory Visit: Payer: BC Managed Care – PPO | Attending: Internal Medicine

## 2019-04-22 DIAGNOSIS — Z23 Encounter for immunization: Secondary | ICD-10-CM | POA: Insufficient documentation

## 2019-04-22 NOTE — Progress Notes (Signed)
   Covid-19 Vaccination Clinic  Name:  Peter Snyder    MRN: [id number] DOB: 1955-11-05  04/22/2019  [name] was observed post Covid-19 immunization for 15 minutes without incidence. He was provided with Vaccine Information Sheet and instruction to access the V-Safe system.   [name] was instructed to call 911 with any severe reactions post vaccine: Marland Kitchen Difficulty breathing  . Swelling of your face and throat  . A fast heartbeat  . A bad rash all over your body  . Dizziness and weakness    Immunizations Administered    Name Date Dose VIS Date Route   Pfizer COVID-19 Vaccine 04/22/2019  3:52 PM 0.3 mL 02/25/2019 Intramuscular   Manufacturer: [place]   Lot: [id number]   [place]: [id number]

## 2019-04-25 ENCOUNTER — Ambulatory Visit: Payer: BC Managed Care – PPO

## 2019-04-25 DIAGNOSIS — M25511 Pain in right shoulder: Secondary | ICD-10-CM | POA: Diagnosis not present

## 2019-04-25 DIAGNOSIS — F901 Attention-deficit hyperactivity disorder, predominantly hyperactive type: Secondary | ICD-10-CM | POA: Diagnosis not present

## 2019-04-25 DIAGNOSIS — F429 Obsessive-compulsive disorder, unspecified: Secondary | ICD-10-CM | POA: Diagnosis not present

## 2019-04-25 DIAGNOSIS — M50923 Unspecified cervical disc disorder at C6-C7 level: Secondary | ICD-10-CM | POA: Diagnosis not present

## 2019-04-25 DIAGNOSIS — M5136 Other intervertebral disc degeneration, lumbar region: Secondary | ICD-10-CM | POA: Diagnosis not present

## 2019-04-25 DIAGNOSIS — M50922 Unspecified cervical disc disorder at C5-C6 level: Secondary | ICD-10-CM | POA: Diagnosis not present

## 2019-04-26 ENCOUNTER — Ambulatory Visit: Payer: BC Managed Care – PPO

## 2019-04-27 DIAGNOSIS — M5136 Other intervertebral disc degeneration, lumbar region: Secondary | ICD-10-CM | POA: Diagnosis not present

## 2019-04-27 DIAGNOSIS — M50923 Unspecified cervical disc disorder at C6-C7 level: Secondary | ICD-10-CM | POA: Diagnosis not present

## 2019-04-27 DIAGNOSIS — M50922 Unspecified cervical disc disorder at C5-C6 level: Secondary | ICD-10-CM | POA: Diagnosis not present

## 2019-04-27 DIAGNOSIS — M25511 Pain in right shoulder: Secondary | ICD-10-CM | POA: Diagnosis not present

## 2019-04-29 ENCOUNTER — Ambulatory Visit: Payer: BC Managed Care – PPO | Admitting: Family Medicine

## 2019-05-02 DIAGNOSIS — M50923 Unspecified cervical disc disorder at C6-C7 level: Secondary | ICD-10-CM | POA: Diagnosis not present

## 2019-05-02 DIAGNOSIS — M5136 Other intervertebral disc degeneration, lumbar region: Secondary | ICD-10-CM | POA: Diagnosis not present

## 2019-05-02 DIAGNOSIS — M50922 Unspecified cervical disc disorder at C5-C6 level: Secondary | ICD-10-CM | POA: Diagnosis not present

## 2019-05-02 DIAGNOSIS — M25511 Pain in right shoulder: Secondary | ICD-10-CM | POA: Diagnosis not present

## 2019-05-02 DIAGNOSIS — F429 Obsessive-compulsive disorder, unspecified: Secondary | ICD-10-CM | POA: Diagnosis not present

## 2019-05-02 DIAGNOSIS — F901 Attention-deficit hyperactivity disorder, predominantly hyperactive type: Secondary | ICD-10-CM | POA: Diagnosis not present

## 2019-05-09 DIAGNOSIS — M25511 Pain in right shoulder: Secondary | ICD-10-CM | POA: Diagnosis not present

## 2019-05-09 DIAGNOSIS — D229 Melanocytic nevi, unspecified: Secondary | ICD-10-CM | POA: Diagnosis not present

## 2019-05-09 DIAGNOSIS — M50922 Unspecified cervical disc disorder at C5-C6 level: Secondary | ICD-10-CM | POA: Diagnosis not present

## 2019-05-09 DIAGNOSIS — M5136 Other intervertebral disc degeneration, lumbar region: Secondary | ICD-10-CM | POA: Diagnosis not present

## 2019-05-09 DIAGNOSIS — M50923 Unspecified cervical disc disorder at C6-C7 level: Secondary | ICD-10-CM | POA: Diagnosis not present

## 2019-05-09 DIAGNOSIS — L309 Dermatitis, unspecified: Secondary | ICD-10-CM | POA: Diagnosis not present

## 2019-05-09 DIAGNOSIS — L820 Inflamed seborrheic keratosis: Secondary | ICD-10-CM | POA: Diagnosis not present

## 2019-05-11 ENCOUNTER — Encounter: Payer: Self-pay | Admitting: Family Medicine

## 2019-05-11 ENCOUNTER — Telehealth (INDEPENDENT_AMBULATORY_CARE_PROVIDER_SITE_OTHER): Payer: BC Managed Care – PPO | Admitting: Family Medicine

## 2019-05-11 ENCOUNTER — Other Ambulatory Visit: Payer: Self-pay

## 2019-05-11 DIAGNOSIS — J0120 Acute ethmoidal sinusitis, unspecified: Secondary | ICD-10-CM

## 2019-05-11 MED ORDER — AMOXICILLIN-POT CLAVULANATE 875-125 MG PO TABS: 1.0000 | ORAL_TABLET | Freq: Two times a day (BID) | ORAL | 0 refills | Status: DC

## 2019-05-11 NOTE — Progress Notes (Signed)
This visit type was conducted due to national recommendations for restrictions regarding the COVID-19 pandemic in an effort to limit this patient's exposure and mitigate transmission in our community.   Virtual Visit via Video Note  I connected with [facility] on 05/11/19 at  1:15 PM EST by a video enabled telemedicine application and verified that I am speaking with the correct person using two identifiers.  Location patient: home Location provider:work or home office Persons participating in the virtual visit: patient, provider  I discussed the limitations of evaluation and management by telemedicine and the availability of in person appointments. The patient expressed understanding and agreed to proceed.   HPI: Peter Snyder calls with over 2-week history of sinus congestion.  This is bilateral.  He takes multiple things at baseline for his allergies including Flonase and Astelin.  He is also doing saline irrigation 2-3 times daily.  He has a humidifier at night.  He does describe some bloody nasal discharge and frequent brownish nasal discharge over the past week.  No fevers or chills.  He does have frequent headaches.  No cough.  Previous intolerance with oral prednisone.  He has had sinusitis frequently in the past but not over the past year.  Past Medical History:  Diagnosis Date  . Arthritis   . CAD (coronary artery disease) 6/14   Cardiac catheterization 6/27/ 2014 ejection fraction 35-40%, 30% proximal left circumflex, 100% tiny obtuse marginal 1 with collaterals, 50% LAD, 50% D1, 100% RCA with collaterals  . Chicken pox   . Colon polyps   . Depression   . Diabetes mellitus ([place])    Diet controlled  . GERD (gastroesophageal reflux disease)   . Hyperlipidemia   . Hypertension   . Kidney stone    Past Surgical History:  Procedure Laterality Date  . CRYO INTERCOSTAL NERVE BLOCK     double sacrum nerve block  . TONSILLECTOMY  1963  . TRIGGER FINGER RELEASE  2019    reports that  he quit smoking about 16 years ago. He has never used smokeless tobacco. He reports that he does not drink alcohol or use drugs. family history includes Arthritis in his father and paternal grandmother; Clotting disorder in his paternal grandfather and paternal grandmother; Colon polyps in his father; Heart disease in his maternal grandmother; Hyperlipidemia in his maternal grandmother; Liver cancer in his paternal grandfather; Melanoma in his mother. Allergies  Allergen Reactions  . Latex Other (See Comments)    Other  . Sulfa Antibiotics Rash      ROS: See pertinent positives and negatives per HPI.  Past Medical History:  Diagnosis Date  . Arthritis   . CAD (coronary artery disease) 6/14   Cardiac catheterization 6/27/ 2014 ejection fraction 35-40%, 30% proximal left circumflex, 100% tiny obtuse marginal 1 with collaterals, 50% LAD, 50% D1, 100% RCA with collaterals  . Chicken pox   . Colon polyps   . Depression   . Diabetes mellitus ([place])    Diet controlled  . GERD (gastroesophageal reflux disease)   . Hyperlipidemia   . Hypertension   . Kidney stone     Past Surgical History:  Procedure Laterality Date  . CRYO INTERCOSTAL NERVE BLOCK     double sacrum nerve block  . TONSILLECTOMY  1963  . TRIGGER FINGER RELEASE  2019    Family History  Problem Relation Age of Onset  . Melanoma Mother        metastatic  . Arthritis Father   . Colon polyps  Father   . Hyperlipidemia Maternal Grandmother   . Heart disease Maternal Grandmother   . Arthritis Paternal Grandmother   . Clotting disorder Paternal Grandmother   . Clotting disorder Paternal Grandfather   . Liver cancer Paternal Grandfather   . Colon cancer Neg Hx   . Esophageal cancer Neg Hx   . Rectal cancer Neg Hx     SOCIAL HX: Non-smoker   Current Outpatient Medications:  .  acetaminophen (TYLENOL) 500 MG tablet, Take 500 mg by mouth every 6 (six) hours as needed for moderate pain., Disp: , Rfl:  .  ALPRAZolam  (XANAX) 0.25 MG tablet, Take 0.25 mg by mouth daily. , Disp: , Rfl:  .  amoxicillin-clavulanate (AUGMENTIN) 875-125 MG tablet, Take 1 tablet by mouth 2 (two) times daily., Disp: 20 tablet, Rfl: 0 .  aspirin 81 MG tablet, Take 81 mg by mouth daily., Disp: , Rfl:  .  atorvastatin (LIPITOR) 80 MG tablet, TAKE 1 TABLET BY MOUTH EVERY DAY, Disp: 90 tablet, Rfl: 1 .  azelastine (ASTELIN) 0.1 % nasal spray, PLACE 1 SPRAY INTO BOTH NOSTRILS 2 (TWO) TIMES DAILY AS DIRECTED, Disp: 30 mL, Rfl: 1 .  Bismuth Tribromoph-Petrolatum (XEROFORM PETROLAT PATCH 4"X4") PADS, USE AS DIRECTED TWICE A DAY EXTERNALLY 30 DAYS, Disp: , Rfl: 4 .  carvedilol (COREG) 12.5 MG tablet, TAKE 1 TABLET (12.5 MG TOTAL) BY MOUTH 2 (TWO) TIMES DAILY WITH A MEAL., Disp: 180 tablet, Rfl: 2 .  celecoxib (CELEBREX) 200 MG capsule, Take 200 mg by mouth daily. , Disp: , Rfl:  .  clidinium-chlordiazePOXIDE (LIBRAX) 5-2.5 MG capsule, Take 1 capsule by mouth 3 (three) times daily before meals., Disp: 60 capsule, Rfl: 0 .  clonazePAM (KLONOPIN) 0.5 MG tablet, Take 0.5 mg by mouth at bedtime. , Disp: , Rfl:  .  Dermatological Products, Misc. (LEVICYN) GEL, Apply 1 application topically 2 (two) times daily after a meal., Disp: , Rfl: 2 .  Dermatological Products, Misc. (TETRIX) CREA, Apply topically See admin instructions., Disp: , Rfl: 3 .  diclofenac Sodium (VOLTAREN) 1 % GEL, diclofenac 1 % topical gel, Disp: , Rfl:  .  ELIDEL 1 % cream, APPLY TO AFFECTED AREA TWICE A DAY AS NEEDED FOR 14 DAYS, Disp: , Rfl: 2 .  escitalopram (LEXAPRO) 20 MG tablet, Take 20 mg by mouth daily., Disp: , Rfl:  .  ezetimibe (ZETIA) 10 MG tablet, TAKE 1 TABLET BY MOUTH EVERY DAY, Disp: 90 tablet, Rfl: 1 .  fluticasone (FLONASE) 50 MCG/ACT nasal spray, USE 2 SPRAYS IN EACH NOSTRIL EVERY DAY, Disp: 48 mL, Rfl: 2 .  gabapentin (NEURONTIN) 600 MG tablet, TAKE 1 TABLET BY MOUTH TWICE A DAY, Disp: 180 tablet, Rfl: 1 .  lisinopril (ZESTRIL) 20 MG tablet, TAKE 1 TABLET BY  MOUTH EVERY DAY, Disp: 90 tablet, Rfl: 1 .  LOTEMAX 0.5 % GEL, SMARTSIG:1 Drop(s) In Eye(s) Twice Daily PRN, Disp: , Rfl:  .  pantoprazole (PROTONIX) 40 MG tablet, TAKE 1 TABLET BY MOUTH TWO TIMES DAILY., Disp: 180 tablet, Rfl: 3 .  RESTASIS 0.05 % ophthalmic emulsion, Place 1 drop into both eyes 2 (two) times daily. , Disp: , Rfl:  .  tacrolimus (PROTOPIC) 0.1 % ointment, APPLY TO AFFECTED AREA ONCE A DAY, Disp: , Rfl:  .  tamsulosin (FLOMAX) 0.4 MG CAPS capsule, TAKE 1 CAPSULE (0.4 MG TOTAL) BY MOUTH DAILY AFTER SUPPER., Disp: 90 capsule, Rfl: 1 .  triamcinolone cream (KENALOG) 0.1 %, APPLY TOPICALLY TO THE AFFECTED AREA 2 (TWO) TIMES  DAILY AS NEEDED., Disp: 30 g, Rfl: 1 .  urea (CARMOL) 10 % cream, APPLY TO AFFECTED AREA TWICE A DAY AS NEEDED, Disp: , Rfl: 3 .  urea (CARMOL) 40 % CREA, APPLY TO AFFECTED AREA AS NEEDED EVERY DAY, Disp: , Rfl:  .  Urea 40 % LOTN, Apply 1 application topically 2 (two) times a week., Disp: , Rfl: 3 .  zolpidem (AMBIEN) 10 MG tablet, Take 10 mg by mouth at bedtime as needed for sleep., Disp: , Rfl:   EXAM:  VITALS per patient if applicable:  GENERAL: alert, oriented, appears well and in no acute distress  HEENT: atraumatic, conjunttiva clear, no obvious abnormalities on inspection of external nose and ears  NECK: normal movements of the head and neck  LUNGS: on inspection no signs of respiratory distress, breathing rate appears normal, no obvious gross SOB, gasping or wheezing  CV: no obvious cyanosis  MS: moves all visible extremities without noticeable abnormality  PSYCH/NEURO: pleasant and cooperative, no obvious depression or anxiety, speech and thought processing grossly intact  ASSESSMENT AND PLAN:  Discussed the following assessment and plan:  Nasal congestion.  Bloody nasal discharge and brownish discharge along with frequent daily headaches suggest possible acute sinusitis  -Augmentin 875 mg twice daily for 10 days and take with  food -Continue saline nasal irrigation and his usual nasal sprays -Recommend office follow-up to further assess if not improving over the next week     I discussed the assessment and treatment plan with the patient. The patient was provided an opportunity to ask questions and all were answered. The patient agreed with the plan and demonstrated an understanding of the instructions.   The patient was advised to call back or seek an in-person evaluation if the symptoms worsen or if the condition fails to improve as anticipated.     Carolann Littler, MD

## 2019-05-12 ENCOUNTER — Encounter: Payer: Self-pay | Admitting: Family Medicine

## 2019-05-13 ENCOUNTER — Other Ambulatory Visit: Payer: Self-pay

## 2019-05-13 ENCOUNTER — Ambulatory Visit (HOSPITAL_COMMUNITY)
Admission: EM | Admit: 2019-05-13 | Discharge: 2019-05-13 | Disposition: A | Discharge status: Home / Self Care | Payer: BC Managed Care – PPO | Attending: Physician Assistant | Admitting: Physician Assistant

## 2019-05-13 ENCOUNTER — Encounter (HOSPITAL_COMMUNITY): Payer: Self-pay

## 2019-05-13 DIAGNOSIS — J322 Chronic ethmoidal sinusitis: Secondary | ICD-10-CM | POA: Diagnosis not present

## 2019-05-13 MED ORDER — CEFPODOXIME PROXETIL 200 MG PO TABS: 200.0000 mg | ORAL_TABLET | Freq: Two times a day (BID) | ORAL | 0 refills | Status: AC

## 2019-05-13 MED ORDER — METHYLPREDNISOLONE SODIUM SUCC 125 MG IJ SOLR
INTRAMUSCULAR | Status: AC
  Filled 2019-05-13: qty 2

## 2019-05-13 MED ORDER — METHYLPREDNISOLONE SODIUM SUCC 125 MG IJ SOLR
80.0000 mg | Freq: Once | INTRAMUSCULAR | Status: AC
  Administered 2019-05-13: 80 mg via INTRAMUSCULAR

## 2019-05-13 NOTE — ED Triage Notes (Signed)
Pt presents with nasal congestion X 1 week.  Pt has Hx of sinus infection; pt has had first covid vaccine this month.

## 2019-05-13 NOTE — ED Provider Notes (Addendum)
[facility]    CSN: [id number] Arrival date & time: 05/13/19  [id number]      History   Chief Complaint Chief Complaint  Patient presents with  . Sinuitis    HPI Peter Snyder is a 64 y.o. male.   Patient presents today for follow-up from a recent telemedicine visit with his primary care provider.  He had a telemedicine visit on 2/24 for 2 weeks of sinus congestion.  He has primarily had the feeling of a stuffy nose and dry nose.  He reports some nasal discharge at times.  At times his nasal discharge is darker yellow. however this is infrequent.  Reports that around the initial time of symptom onset he was sneezing and had watery eyes frequently.  He denies facial pain, fever, chills, cough, headache, nausea, vomiting.  He also denies ear fullness, hearing loss, ear pain.  He has had no sick contacts.  His provider on 05/11/2019 prescribed Augmentin given that he has had a history of sinus infections dating to 1 year ago.  Patient states that he was unable to tolerate the Augmentin due to severe diarrhea.  And he was suggested to come into urgent care to verify that he needs to be on antibiotics.  He is on a daily Flonase, azelastine regiment.  He does report symptoms seem to get worse after going for a walk out in the cold the other day.     Past Medical History:  Diagnosis Date  . Arthritis   . CAD (coronary artery disease) 6/14   Cardiac catheterization 6/27/ 2014 ejection fraction 35-40%, 30% proximal left circumflex, 100% tiny obtuse marginal 1 with collaterals, 50% LAD, 50% D1, 100% RCA with collaterals  . Chicken pox   . Colon polyps   . Depression   . Diabetes mellitus ([place])    Diet controlled  . GERD (gastroesophageal reflux disease)   . Hyperlipidemia   . Hypertension   . Kidney stone     Patient Active Problem List   Diagnosis Date Noted  . Viral URI 03/16/2015  . Gastroenteritis 04/03/2014  . Chronic insomnia 03/14/2014  . Peripheral neuropathy  09/02/2013  . Cardiomyopathy, ischemic 01/18/2013  . CAD (coronary artery disease) 12/29/2012  . GERD (gastroesophageal reflux disease) 12/29/2012  . Essential hypertension, benign 12/29/2012  . Dyslipidemia 12/29/2012  . Personal history of colonic polyps 12/29/2012  . Kidney stones 12/29/2012  . Depression 12/29/2012  . Degenerative arthritis of lumbar spine 12/29/2012  . BPH (benign prostatic hyperplasia) 12/29/2012  . Essential tremor 12/29/2012  . Prediabetes 12/29/2012    Past Surgical History:  Procedure Laterality Date  . CRYO INTERCOSTAL NERVE BLOCK     double sacrum nerve block  . TONSILLECTOMY  1963  . TRIGGER FINGER RELEASE  2019       Home Medications    Prior to Admission medications   Medication Sig Start Date End Date Taking? Authorizing Provider  acetaminophen (TYLENOL) 500 MG tablet Take 500 mg by mouth every 6 (six) hours as needed for moderate pain.    [provider]  ALPRAZolam Duanne Moron) 0.25 MG tablet Take 0.25 mg by mouth daily.     [provider]  amoxicillin-clavulanate (AUGMENTIN) 875-125 MG tablet Take 1 tablet by mouth 2 (two) times daily. 05/11/19   Burchette, Alinda Sierras, MD  aspirin 81 MG tablet Take 81 mg by mouth daily.    [provider]  atorvastatin (LIPITOR) 80 MG tablet TAKE 1 TABLET BY MOUTH EVERY DAY 03/21/19  Burchette, Alinda Sierras, MD  azelastine (ASTELIN) 0.1 % nasal spray PLACE 1 SPRAY INTO BOTH NOSTRILS 2 (TWO) TIMES DAILY AS DIRECTED 12/28/18   Burchette, Alinda Sierras, MD  Bismuth Tribromoph-Petrolatum (XEROFORM PETROLAT PATCH 4"X4") PADS USE AS DIRECTED TWICE A DAY EXTERNALLY 30 DAYS 08/27/15   [provider]  carvedilol (COREG) 12.5 MG tablet TAKE 1 TABLET (12.5 MG TOTAL) BY MOUTH 2 (TWO) TIMES DAILY WITH A MEAL. 12/23/18   Lelon Perla, MD  cefpodoxime (VANTIN) 200 MG tablet Take 1 tablet (200 mg total) by mouth 2 (two) times daily for 7 days. 05/13/19 05/20/19  Anayeli Arel, Marguerita Beards, PA-C  celecoxib (CELEBREX) 200  MG capsule Take 200 mg by mouth daily.     [provider]  clidinium-chlordiazePOXIDE (LIBRAX) 5-2.5 MG capsule Take 1 capsule by mouth 3 (three) times daily before meals. 02/09/19   Burchette, Alinda Sierras, MD  clonazePAM (KLONOPIN) 0.5 MG tablet Take 0.5 mg by mouth at bedtime.     [provider]  Dermatological Products, [place]. (LEVICYN) GEL Apply 1 application topically 2 (two) times daily after a meal. 01/02/17   [provider]  Dermatological Products, [place]. (TETRIX) CREA Apply topically See admin instructions. 04/14/16   [provider]  diclofenac Sodium (VOLTAREN) 1 % GEL diclofenac 1 % topical gel    [provider]  ELIDEL 1 % cream APPLY TO AFFECTED AREA TWICE A DAY AS NEEDED FOR 14 DAYS 08/24/15   [provider]  escitalopram (LEXAPRO) 20 MG tablet Take 20 mg by mouth daily.    [provider]  ezetimibe (ZETIA) 10 MG tablet TAKE 1 TABLET BY MOUTH EVERY DAY 01/10/19   Burchette, Alinda Sierras, MD  fluticasone (FLONASE) 50 MCG/ACT nasal spray USE 2 SPRAYS IN EACH NOSTRIL EVERY DAY 11/01/18   Burchette, Alinda Sierras, MD  gabapentin (NEURONTIN) 600 MG tablet TAKE 1 TABLET BY MOUTH TWICE A DAY 12/14/18   Burchette, Alinda Sierras, MD  lisinopril (ZESTRIL) 20 MG tablet TAKE 1 TABLET BY MOUTH EVERY DAY 12/10/18   Lelon Perla, MD  LOTEMAX 0.5 % GEL SMARTSIG:1 Drop(s) In Eye(s) Twice Daily PRN 12/06/18   [provider]  pantoprazole (PROTONIX) 40 MG tablet TAKE 1 TABLET BY MOUTH TWO TIMES DAILY. 04/18/19   Burchette, Alinda Sierras, MD  RESTASIS 0.05 % ophthalmic emulsion Place 1 drop into both eyes 2 (two) times daily.  11/20/12   [provider]  tacrolimus (PROTOPIC) 0.1 % ointment APPLY TO AFFECTED AREA ONCE A DAY 01/13/19   [provider]  tamsulosin (FLOMAX) 0.4 MG CAPS capsule TAKE 1 CAPSULE (0.4 MG TOTAL) BY MOUTH DAILY AFTER SUPPER. 12/22/18   Burchette, Alinda Sierras, MD  triamcinolone cream (KENALOG) 0.1 % APPLY TOPICALLY TO THE  AFFECTED AREA 2 (TWO) TIMES DAILY AS NEEDED. 02/28/19   Burchette, Alinda Sierras, MD  urea (CARMOL) 10 % cream APPLY TO AFFECTED AREA TWICE A DAY AS NEEDED 04/28/16   [provider]  urea (CARMOL) 40 % CREA APPLY TO AFFECTED AREA AS NEEDED EVERY DAY 11/29/18   [provider]  Urea 40 % LOTN Apply 1 application topically 2 (two) times a week. 01/02/17   [provider]  zolpidem (AMBIEN) 10 MG tablet Take 10 mg by mouth at bedtime as needed for sleep.    [provider]    Family History Family History  Problem Relation Age of Onset  . Melanoma Mother        metastatic  . Arthritis Father   .  Colon polyps Father   . Hyperlipidemia Maternal Grandmother   . Heart disease Maternal Grandmother   . Arthritis Paternal Grandmother   . Clotting disorder Paternal Grandmother   . Clotting disorder Paternal Grandfather   . Liver cancer Paternal Grandfather   . Colon cancer Neg Hx   . Esophageal cancer Neg Hx   . Rectal cancer Neg Hx     Social History Social History   Tobacco Use  . Smoking status: Former Smoker    Quit date: 09/15/2002    Years since quitting: 16.6  . Smokeless tobacco: Never Used  . Tobacco comment: quit 15 years ago -socially  Substance Use Topics  . Alcohol use: No    Alcohol/week: 0.0 standard drinks  . Drug use: No     Allergies   Latex and Sulfa antibiotics   Review of Systems Review of Systems  Constitutional: Negative for chills and fever.  HENT: Positive for congestion, rhinorrhea and sneezing. Negative for ear pain, hearing loss, postnasal drip, sinus pressure, sinus pain and sore throat.   Eyes: Negative for pain and visual disturbance.  Respiratory: Negative for cough and shortness of breath.   Cardiovascular: Negative for chest pain and palpitations.  Gastrointestinal: Negative for abdominal pain, diarrhea, nausea and vomiting.  Musculoskeletal: Negative for arthralgias, back pain and myalgias.  Skin: Negative for  color change and rash.  Neurological: Negative for seizures, syncope and headaches.  All other systems reviewed and are negative.    Physical Exam Triage Vital Signs ED Triage Vitals  Enc Vitals Group     BP 05/13/19 0920 112/66     Pulse Rate 05/13/19 0920 80     Resp 05/13/19 0920 17     Temp 05/13/19 0920 98.6 F (37 C)     Temp Source 05/13/19 0920 Oral     SpO2 05/13/19 0920 97 %     Weight --      Height --      Head Circumference --      Peak Flow --      Pain Score 05/13/19 0922 1     Pain Loc --      Pain Edu? --      Excl. in [place]? --    No data found.  Updated Vital Signs BP 112/66 (BP Location: Right Arm)   Pulse 80   Temp 98.6 F (37 C) (Oral)   Resp 17   SpO2 97%   Visual Acuity Right Eye Distance:   Left Eye Distance:   Bilateral Distance:    Right Eye Near:   Left Eye Near:    Bilateral Near:     Physical Exam Vitals and nursing note reviewed.  Constitutional:      Appearance: He is well-developed.  HENT:     Head: Normocephalic and atraumatic.     Comments: No facial tenderness over the maxillary or frontal sinuses.      Right Ear: Tympanic membrane normal.     Left Ear: Tympanic membrane normal.     Nose:     Comments: Turbinates with mild erythema but swollen.  No mucopurulent discharge noted.  Dry blood visible.    Mouth/Throat:     Mouth: Mucous membranes are moist.     Pharynx: Oropharynx is clear.  Eyes:     General: No scleral icterus.    Extraocular Movements: Extraocular movements intact.     Conjunctiva/sclera: Conjunctivae normal.     Pupils: Pupils are equal, round, and reactive to light.  Cardiovascular:     Rate and Rhythm: Normal rate and regular rhythm.     Heart sounds: No murmur.  Pulmonary:     Effort: Pulmonary effort is normal. No respiratory distress.     Breath sounds: Normal breath sounds. No wheezing, rhonchi or rales.  Musculoskeletal:     Cervical back: Normal range of motion and neck supple.     Right  lower leg: No edema.     Left lower leg: No edema.  Skin:    General: Skin is warm and dry.  Neurological:     General: No focal deficit present.     Mental Status: He is alert and oriented to person, place, and time.  Psychiatric:        Mood and Affect: Mood normal.        Behavior: Behavior normal.        Thought Content: Thought content normal.        Judgment: Judgment normal.      UC Treatments / Results  Labs (all labs ordered are listed, but only abnormal results are displayed) Labs Reviewed - No data to display  EKG   Radiology No results found.  Procedures Procedures (including critical care time)  Medications Ordered in UC Medications  methylPREDNISolone sodium succinate (SOLU-MEDROL) 125 mg/2 mL injection 80 mg (80 mg Intramuscular Given 05/13/19 1107)    Initial Impression / Assessment and Plan / UC Course  I have reviewed the triage vital signs and the nursing notes.  Pertinent labs & imaging results that were available during my care of the patient were reviewed by me and considered in my medical decision making (see chart for details).  Since primary care note from 05/11/2019 was reviewed during this visit.    #Sinusitis-allergic vs bacterial  Patient is a 64 year old gentleman presenting with symptoms consistent with sinusitis.  Given there is a 2-week or more duration of symptoms that have not necessarily worsened and exam today unconvincing for acute bacterial sinusitis will recommend holding antibiotics for 2 to 3 days for symptom improvement or worsening.  Cefpodoxime  was sent as an alternative to his pharmacy to be filled if no improvement or worsening nasal congestion in the next 2 to 3 days.  Given that his symptoms have been present for over [redacted] weeks along with no other symptoms been present and there appears to be some allergic aspect Covid testing was felt not to be indicated today. -Methylprednisolone given today in office for sinus  inflammation -Hold Flonase for 2 to 3 days -Continue azelastine and include nasal saline sprays daily -Follow-up with primary care if not improving.   Final Clinical Impressions(s) / UC Diagnoses   Final diagnoses:  Ethmoid sinusitis, unspecified chronicity     Discharge Instructions     I have prescribed a antibiotic that I would like for you to only start if you are not having improvement in your sinus congestion for if you have worsening symptoms in the next 2 to 3 days.  I would like for you to hold your Flonase for the next 2 to 3 days pending improvement but continue your other nasal treatments.  I would like very much for you to utilize nasal saline spray throughout the day as well.  Like for you to begin taking Zyrtec 1 time daily.  Please follow-up with your primary care for any further concerns about your chronic sinus conditions      ED Prescriptions    Medication Sig Dispense Auth. Provider  cefpodoxime (VANTIN) 200 MG tablet Take 1 tablet (200 mg total) by mouth 2 (two) times daily for 7 days. 14 tablet Zay Yeargan, Marguerita Beards, PA-C     PDMP not reviewed this encounter.   Purnell Shoemaker, PA-C 05/13/19 1112    Zalyn Amend, Marguerita Beards, PA-C 05/13/19 1118

## 2019-05-13 NOTE — Discharge Instructions (Signed)
I have prescribed a antibiotic that I would like for you to only start if you are not having improvement in your sinus congestion for if you have worsening symptoms in the next 2 to 3 days.  I would like for you to hold your Flonase for the next 2 to 3 days pending improvement but continue your other nasal treatments.  I would like very much for you to utilize nasal saline spray throughout the day as well.  Like for you to begin taking Zyrtec 1 time daily.  Please follow-up with your primary care for any further concerns about your chronic sinus conditions

## 2019-05-16 DIAGNOSIS — M50922 Unspecified cervical disc disorder at C5-C6 level: Secondary | ICD-10-CM | POA: Diagnosis not present

## 2019-05-16 DIAGNOSIS — M50923 Unspecified cervical disc disorder at C6-C7 level: Secondary | ICD-10-CM | POA: Diagnosis not present

## 2019-05-16 DIAGNOSIS — M25511 Pain in right shoulder: Secondary | ICD-10-CM | POA: Diagnosis not present

## 2019-05-16 DIAGNOSIS — F901 Attention-deficit hyperactivity disorder, predominantly hyperactive type: Secondary | ICD-10-CM | POA: Diagnosis not present

## 2019-05-16 DIAGNOSIS — F429 Obsessive-compulsive disorder, unspecified: Secondary | ICD-10-CM | POA: Diagnosis not present

## 2019-05-16 DIAGNOSIS — M5136 Other intervertebral disc degeneration, lumbar region: Secondary | ICD-10-CM | POA: Diagnosis not present

## 2019-05-17 ENCOUNTER — Ambulatory Visit: Payer: BC Managed Care – PPO | Attending: Internal Medicine

## 2019-05-17 DIAGNOSIS — Z23 Encounter for immunization: Secondary | ICD-10-CM

## 2019-05-17 NOTE — Progress Notes (Signed)
   Covid-19 Vaccination Clinic  Name:  Kiryn Lersch    MRN: [id number] DOB: 09-Jul-1955  05/17/2019  Mr. Lizotte was observed post Covid-19 immunization for 15 minutes without incident. He was provided with Vaccine Information Sheet and instruction to access the V-Safe system.   Mr. Newfield was instructed to call 911 with any severe reactions post vaccine: Marland Kitchen Difficulty breathing  . Swelling of face and throat  . A fast heartbeat  . A bad rash all over body  . Dizziness and weakness   Immunizations Administered    Name Date Dose VIS Date Route   Pfizer COVID-19 Vaccine 05/17/2019  3:19 PM 0.3 mL 02/25/2019 Intramuscular   Manufacturer: [place]   Lot: [id number]   [place]: [id number]

## 2019-05-18 ENCOUNTER — Encounter: Payer: Self-pay | Admitting: Family Medicine

## 2019-05-18 ENCOUNTER — Encounter: Payer: Self-pay | Admitting: *Deleted

## 2019-05-18 DIAGNOSIS — I712 Thoracic aortic aneurysm, without rupture, unspecified: Secondary | ICD-10-CM

## 2019-05-18 DIAGNOSIS — Z713 Dietary counseling and surveillance: Secondary | ICD-10-CM | POA: Diagnosis not present

## 2019-05-19 DIAGNOSIS — G5761 Lesion of plantar nerve, right lower limb: Secondary | ICD-10-CM | POA: Diagnosis not present

## 2019-05-19 DIAGNOSIS — M7751 Other enthesopathy of right foot: Secondary | ICD-10-CM | POA: Diagnosis not present

## 2019-05-23 DIAGNOSIS — M50922 Unspecified cervical disc disorder at C5-C6 level: Secondary | ICD-10-CM | POA: Diagnosis not present

## 2019-05-23 DIAGNOSIS — M50923 Unspecified cervical disc disorder at C6-C7 level: Secondary | ICD-10-CM | POA: Diagnosis not present

## 2019-05-23 DIAGNOSIS — M25511 Pain in right shoulder: Secondary | ICD-10-CM | POA: Diagnosis not present

## 2019-05-23 DIAGNOSIS — M5136 Other intervertebral disc degeneration, lumbar region: Secondary | ICD-10-CM | POA: Diagnosis not present

## 2019-05-24 ENCOUNTER — Telehealth (INDEPENDENT_AMBULATORY_CARE_PROVIDER_SITE_OTHER): Payer: BC Managed Care – PPO | Admitting: Family Medicine

## 2019-05-24 ENCOUNTER — Other Ambulatory Visit: Payer: Self-pay

## 2019-05-24 DIAGNOSIS — R0981 Nasal congestion: Secondary | ICD-10-CM | POA: Diagnosis not present

## 2019-05-24 MED ORDER — MUPIROCIN 2 % EX OINT: 1.0000 "application " | TOPICAL_OINTMENT | Freq: Two times a day (BID) | CUTANEOUS | 1 refills | Status: DC

## 2019-05-24 NOTE — Progress Notes (Signed)
This visit type was conducted due to national recommendations for restrictions regarding the COVID-19 pandemic in an effort to limit this patient's exposure and mitigate transmission in our community.   Virtual Visit via Video Note  I connected with [facility] on 05/24/19 at  3:45 PM EST by a video enabled telemedicine application and verified that I am speaking with the correct person using two identifiers.  Location patient: home Location provider:work or home office Persons participating in the virtual visit: patient, provider  I discussed the limitations of evaluation and management by telemedicine and the availability of in person appointments. The patient expressed understanding and agreed to proceed.   HPI: Peter Snyder has had several weeks of bilateral nasal congestion.  He was treated empirically with Augmentin but had some stomach upset.  He ended up going to urgent care afterwards and had some nasal blood on exam and was prescribed Keflex but this ended up causing some nausea.  He has been using Flonase nasal as well as saline rinses several times daily.  He has some crusted blood and significant dryness nasal passages bilaterally.  No facial pain.  No significant headaches.  No fever.   ROS: See pertinent positives and negatives per HPI.  Past Medical History:  Diagnosis Date  . Arthritis   . CAD (coronary artery disease) 6/14   Cardiac catheterization 6/27/ 2014 ejection fraction 35-40%, 30% proximal left circumflex, 100% tiny obtuse marginal 1 with collaterals, 50% LAD, 50% D1, 100% RCA with collaterals  . Chicken pox   . Colon polyps   . Depression   . Diabetes mellitus ([place])    Diet controlled  . GERD (gastroesophageal reflux disease)   . Hyperlipidemia   . Hypertension   . Kidney stone     Past Surgical History:  Procedure Laterality Date  . CRYO INTERCOSTAL NERVE BLOCK     double sacrum nerve block  . TONSILLECTOMY  1963  . TRIGGER FINGER RELEASE  2019     Family History  Problem Relation Age of Onset  . Melanoma Mother        metastatic  . Arthritis Father   . Colon polyps Father   . Hyperlipidemia Maternal Grandmother   . Heart disease Maternal Grandmother   . Arthritis Paternal Grandmother   . Clotting disorder Paternal Grandmother   . Clotting disorder Paternal Grandfather   . Liver cancer Paternal Grandfather   . Colon cancer Neg Hx   . Esophageal cancer Neg Hx   . Rectal cancer Neg Hx     SOCIAL HX: Non-smoker   Current Outpatient Medications:  .  acetaminophen (TYLENOL) 500 MG tablet, Take 500 mg by mouth every 6 (six) hours as needed for moderate pain., Disp: , Rfl:  .  ALPRAZolam (XANAX) 0.25 MG tablet, Take 0.25 mg by mouth daily. , Disp: , Rfl:  .  amoxicillin-clavulanate (AUGMENTIN) 875-125 MG tablet, Take 1 tablet by mouth 2 (two) times daily., Disp: 20 tablet, Rfl: 0 .  aspirin 81 MG tablet, Take 81 mg by mouth daily., Disp: , Rfl:  .  atorvastatin (LIPITOR) 80 MG tablet, TAKE 1 TABLET BY MOUTH EVERY DAY, Disp: 90 tablet, Rfl: 1 .  azelastine (ASTELIN) 0.1 % nasal spray, PLACE 1 SPRAY INTO BOTH NOSTRILS 2 (TWO) TIMES DAILY AS DIRECTED, Disp: 30 mL, Rfl: 1 .  Bismuth Tribromoph-Petrolatum (XEROFORM PETROLAT PATCH 4"X4") PADS, USE AS DIRECTED TWICE A DAY EXTERNALLY 30 DAYS, Disp: , Rfl: 4 .  carvedilol (COREG) 12.5 MG tablet, TAKE 1 TABLET (  12.5 MG TOTAL) BY MOUTH 2 (TWO) TIMES DAILY WITH A MEAL., Disp: 180 tablet, Rfl: 2 .  celecoxib (CELEBREX) 200 MG capsule, Take 200 mg by mouth daily. , Disp: , Rfl:  .  clidinium-chlordiazePOXIDE (LIBRAX) 5-2.5 MG capsule, Take 1 capsule by mouth 3 (three) times daily before meals., Disp: 60 capsule, Rfl: 0 .  clonazePAM (KLONOPIN) 0.5 MG tablet, Take 0.5 mg by mouth at bedtime. , Disp: , Rfl:  .  Dermatological Products, Misc. (LEVICYN) GEL, Apply 1 application topically 2 (two) times daily after a meal., Disp: , Rfl: 2 .  Dermatological Products, Misc. (TETRIX) CREA, Apply  topically See admin instructions., Disp: , Rfl: 3 .  diclofenac Sodium (VOLTAREN) 1 % GEL, diclofenac 1 % topical gel, Disp: , Rfl:  .  ELIDEL 1 % cream, APPLY TO AFFECTED AREA TWICE A DAY AS NEEDED FOR 14 DAYS, Disp: , Rfl: 2 .  escitalopram (LEXAPRO) 20 MG tablet, Take 20 mg by mouth daily., Disp: , Rfl:  .  ezetimibe (ZETIA) 10 MG tablet, TAKE 1 TABLET BY MOUTH EVERY DAY, Disp: 90 tablet, Rfl: 1 .  fluticasone (FLONASE) 50 MCG/ACT nasal spray, USE 2 SPRAYS IN EACH NOSTRIL EVERY DAY, Disp: 48 mL, Rfl: 2 .  gabapentin (NEURONTIN) 600 MG tablet, TAKE 1 TABLET BY MOUTH TWICE A DAY, Disp: 180 tablet, Rfl: 1 .  lisinopril (ZESTRIL) 20 MG tablet, TAKE 1 TABLET BY MOUTH EVERY DAY, Disp: 90 tablet, Rfl: 1 .  LOTEMAX 0.5 % GEL, SMARTSIG:1 Drop(s) In Eye(s) Twice Daily PRN, Disp: , Rfl:  .  mupirocin ointment (BACTROBAN) 2 %, Place 1 application into the nose 2 (two) times daily. Apply intranasally bid for 5 days., Disp: 22 g, Rfl: 1 .  pantoprazole (PROTONIX) 40 MG tablet, TAKE 1 TABLET BY MOUTH TWO TIMES DAILY., Disp: 180 tablet, Rfl: 3 .  RESTASIS 0.05 % ophthalmic emulsion, Place 1 drop into both eyes 2 (two) times daily. , Disp: , Rfl:  .  tacrolimus (PROTOPIC) 0.1 % ointment, APPLY TO AFFECTED AREA ONCE A DAY, Disp: , Rfl:  .  tamsulosin (FLOMAX) 0.4 MG CAPS capsule, TAKE 1 CAPSULE (0.4 MG TOTAL) BY MOUTH DAILY AFTER SUPPER., Disp: 90 capsule, Rfl: 1 .  triamcinolone cream (KENALOG) 0.1 %, APPLY TOPICALLY TO THE AFFECTED AREA 2 (TWO) TIMES DAILY AS NEEDED., Disp: 30 g, Rfl: 1 .  urea (CARMOL) 10 % cream, APPLY TO AFFECTED AREA TWICE A DAY AS NEEDED, Disp: , Rfl: 3 .  urea (CARMOL) 40 % CREA, APPLY TO AFFECTED AREA AS NEEDED EVERY DAY, Disp: , Rfl:  .  Urea 40 % LOTN, Apply 1 application topically 2 (two) times a week., Disp: , Rfl: 3 .  zolpidem (AMBIEN) 10 MG tablet, Take 10 mg by mouth at bedtime as needed for sleep., Disp: , Rfl:   EXAM:  VITALS per patient if applicable:  GENERAL: alert,  oriented, appears well and in no acute distress  HEENT: atraumatic, conjunttiva clear, no obvious abnormalities on inspection of external nose and ears  NECK: normal movements of the head and neck  LUNGS: on inspection no signs of respiratory distress, breathing rate appears normal, no obvious gross SOB, gasping or wheezing  CV: no obvious cyanosis  MS: moves all visible extremities without noticeable abnormality  PSYCH/NEURO: pleasant and cooperative, no obvious depression or anxiety, speech and thought processing grossly intact  ASSESSMENT AND PLAN:  Discussed the following assessment and plan:  Persistent nasal congestion.  He is describing some nasal mucosal  drying and bloody crusted discharge at times.  He has had recent intolerance with Augmentin and Keflex.  -Continue with nasal saline irrigation -Bactroban ointment apply intranasally twice daily for 5 to 6 days -We will set up ENT referral -Continue Flonase     I discussed the assessment and treatment plan with the patient. The patient was provided an opportunity to ask questions and all were answered. The patient agreed with the plan and demonstrated an understanding of the instructions.   The patient was advised to call back or seek an in-person evaluation if the symptoms worsen or if the condition fails to improve as anticipated.    Carolann Littler, MD

## 2019-05-27 DIAGNOSIS — G5761 Lesion of plantar nerve, right lower limb: Secondary | ICD-10-CM | POA: Diagnosis not present

## 2019-05-27 DIAGNOSIS — M25572 Pain in left ankle and joints of left foot: Secondary | ICD-10-CM | POA: Diagnosis not present

## 2019-05-29 ENCOUNTER — Other Ambulatory Visit: Payer: Self-pay

## 2019-05-29 ENCOUNTER — Ambulatory Visit
Admission: RE | Admit: 2019-05-29 | Discharge: 2019-05-29 | Disposition: A | Discharge status: Home / Self Care | Payer: BC Managed Care – PPO | Source: Ambulatory Visit | Attending: Cardiology | Admitting: Cardiology

## 2019-05-29 DIAGNOSIS — I712 Thoracic aortic aneurysm, without rupture, unspecified: Secondary | ICD-10-CM

## 2019-05-29 MED ORDER — GADOBENATE DIMEGLUMINE 529 MG/ML IV SOLN
20.0000 mL | Freq: Once | INTRAVENOUS | Status: AC | PRN
  Administered 2019-05-29: 20 mL via INTRAVENOUS

## 2019-05-30 ENCOUNTER — Other Ambulatory Visit: Payer: Self-pay | Admitting: *Deleted

## 2019-05-30 DIAGNOSIS — M50923 Unspecified cervical disc disorder at C6-C7 level: Secondary | ICD-10-CM | POA: Diagnosis not present

## 2019-05-30 DIAGNOSIS — M5136 Other intervertebral disc degeneration, lumbar region: Secondary | ICD-10-CM | POA: Diagnosis not present

## 2019-05-30 DIAGNOSIS — M25511 Pain in right shoulder: Secondary | ICD-10-CM | POA: Diagnosis not present

## 2019-05-30 DIAGNOSIS — M50922 Unspecified cervical disc disorder at C5-C6 level: Secondary | ICD-10-CM | POA: Diagnosis not present

## 2019-05-30 DIAGNOSIS — I712 Thoracic aortic aneurysm, without rupture, unspecified: Secondary | ICD-10-CM

## 2019-05-31 ENCOUNTER — Other Ambulatory Visit: Payer: Self-pay

## 2019-05-31 ENCOUNTER — Ambulatory Visit (INDEPENDENT_AMBULATORY_CARE_PROVIDER_SITE_OTHER): Payer: BC Managed Care – PPO | Admitting: Otolaryngology

## 2019-05-31 ENCOUNTER — Encounter (INDEPENDENT_AMBULATORY_CARE_PROVIDER_SITE_OTHER): Payer: Self-pay | Admitting: Otolaryngology

## 2019-05-31 VITALS — Temp 97.7°F

## 2019-05-31 DIAGNOSIS — J310 Chronic rhinitis: Secondary | ICD-10-CM | POA: Diagnosis not present

## 2019-05-31 DIAGNOSIS — H9313 Tinnitus, bilateral: Secondary | ICD-10-CM | POA: Diagnosis not present

## 2019-05-31 NOTE — Progress Notes (Signed)
HPI: Ziyon Barrone is a 64 y.o. male who presents is referred by Dr. Elease Hashimoto for evaluation of symptoms of sinus infection.  He complained of nasal congestion which has been chronic as well as pressure in his "sinuses".  He has not had any colored discharge recently.  Most of the mucus discharge is being clear.  He was initially treated with Augmentin several weeks ago but did not tolerate this well as it caused him to be sick on his stomach.  He was subsequently seen at urgent care and treated with Keflex but has not quite completed this but is feeling better overall. He also has a long history of tinnitus which is worse in his left ear.  He apparently used to listen to hard rock at a loud level.  Denies any loud noise exposure presently.  He had a hearing test previously which showed reasonably good hearing in both ears.  Past Medical History:  Diagnosis Date  . Arthritis   . CAD (coronary artery disease) 6/14   Cardiac catheterization 6/27/ 2014 ejection fraction 35-40%, 30% proximal left circumflex, 100% tiny obtuse marginal 1 with collaterals, 50% LAD, 50% D1, 100% RCA with collaterals  . Chicken pox   . Colon polyps   . Depression   . Diabetes mellitus ([place])    Diet controlled  . GERD (gastroesophageal reflux disease)   . Hyperlipidemia   . Hypertension   . Kidney stone    Past Surgical History:  Procedure Laterality Date  . CRYO INTERCOSTAL NERVE BLOCK     double sacrum nerve block  . TONSILLECTOMY  1963  . TRIGGER FINGER RELEASE  2019   Social History   Socioeconomic History  . Marital status: Married    Spouse name: Not on file  . Number of children: 3  . Years of education: Not on file  . Highest education level: Not on file  Occupational History  . Occupation: retired    Comment: Retired  Tobacco Use  . Smoking status: Former Smoker    Packs/day: 0.25    Years: 28.00    Pack years: 7.00    Start date: 78    Quit date: 2000    Years since quitting: 21.2  .  Smokeless tobacco: Never Used  Substance and Sexual Activity  . Alcohol use: No    Alcohol/week: 0.0 standard drinks  . Drug use: No  . Sexual activity: Not on file  Other Topics Concern  . Not on file  Social History Narrative  . Not on file   Social Determinants of Health   Financial Resource Strain:   . Difficulty of Paying Living Expenses:   Food Insecurity:   . Worried About Charity fundraiser in the Last Year:   . Arboriculturist in the Last Year:   Transportation Needs:   . Film/video editor (Medical):   Marland Kitchen Lack of Transportation (Non-Medical):   Physical Activity:   . Days of Exercise per Week:   . Minutes of Exercise per Session:   Stress:   . Feeling of Stress :   Social Connections:   . Frequency of Communication with Friends and Family:   . Frequency of Social Gatherings with Friends and Family:   . Attends Religious Services:   . Active Member of Clubs or Organizations:   . Attends Archivist Meetings:   Marland Kitchen Marital Status:    Family History  Problem Relation Age of Onset  . Melanoma Mother  metastatic  . Arthritis Father   . Colon polyps Father   . Hyperlipidemia Maternal Grandmother   . Heart disease Maternal Grandmother   . Arthritis Paternal Grandmother   . Clotting disorder Paternal Grandmother   . Clotting disorder Paternal Grandfather   . Liver cancer Paternal Grandfather   . Colon cancer Neg Hx   . Esophageal cancer Neg Hx   . Rectal cancer Neg Hx    Allergies  Allergen Reactions  . Latex Other (See Comments)    Other  . Sulfa Antibiotics Rash   Prior to Admission medications   Medication Sig Start Date End Date Taking? Authorizing Provider  acetaminophen (TYLENOL) 500 MG tablet Take 500 mg by mouth every 6 (six) hours as needed for moderate pain.   Yes [provider]  ALPRAZolam (XANAX) 0.25 MG tablet Take 0.25 mg by mouth daily.    Yes [provider]  amoxicillin-clavulanate (AUGMENTIN) 875-125  MG tablet Take 1 tablet by mouth 2 (two) times daily. 05/11/19  Yes Burchette, Alinda Sierras, MD  aspirin 81 MG tablet Take 81 mg by mouth daily.   Yes [provider]  atorvastatin (LIPITOR) 80 MG tablet TAKE 1 TABLET BY MOUTH EVERY DAY 03/21/19  Yes Burchette, Alinda Sierras, MD  azelastine (ASTELIN) 0.1 % nasal spray PLACE 1 SPRAY INTO BOTH NOSTRILS 2 (TWO) TIMES DAILY AS DIRECTED 12/28/18  Yes Burchette, Alinda Sierras, MD  Bismuth Tribromoph-Petrolatum (XEROFORM PETROLAT PATCH 4"X4") PADS USE AS DIRECTED TWICE A DAY EXTERNALLY 30 DAYS 08/27/15  Yes [provider]  carvedilol (COREG) 12.5 MG tablet TAKE 1 TABLET (12.5 MG TOTAL) BY MOUTH 2 (TWO) TIMES DAILY WITH A MEAL. 12/23/18  Yes Lelon Perla, MD  celecoxib (CELEBREX) 200 MG capsule Take 200 mg by mouth daily.    Yes [provider]  clidinium-chlordiazePOXIDE (LIBRAX) 5-2.5 MG capsule Take 1 capsule by mouth 3 (three) times daily before meals. 02/09/19  Yes Burchette, Alinda Sierras, MD  clonazePAM (KLONOPIN) 0.5 MG tablet Take 0.5 mg by mouth at bedtime.    Yes [provider]  Dermatological Products, [place]. (LEVICYN) GEL Apply 1 application topically 2 (two) times daily after a meal. 01/02/17  Yes [provider]  Dermatological Products, [place]. (TETRIX) CREA Apply topically See admin instructions. 04/14/16  Yes [provider]  diclofenac Sodium (VOLTAREN) 1 % GEL diclofenac 1 % topical gel   Yes [provider]  ELIDEL 1 % cream APPLY TO AFFECTED AREA TWICE A DAY AS NEEDED FOR 14 DAYS 08/24/15  Yes [provider]  escitalopram (LEXAPRO) 20 MG tablet Take 20 mg by mouth daily.   Yes [provider]  ezetimibe (ZETIA) 10 MG tablet TAKE 1 TABLET BY MOUTH EVERY DAY 01/10/19  Yes Burchette, Alinda Sierras, MD  fluticasone (FLONASE) 50 MCG/ACT nasal spray USE 2 SPRAYS IN EACH NOSTRIL EVERY DAY 11/01/18  Yes Burchette, Alinda Sierras, MD  gabapentin (NEURONTIN) 600 MG tablet TAKE 1 TABLET BY MOUTH TWICE A DAY  12/14/18  Yes Burchette, Alinda Sierras, MD  lisinopril (ZESTRIL) 20 MG tablet TAKE 1 TABLET BY MOUTH EVERY DAY 12/10/18  Yes Lelon Perla, MD  LOTEMAX 0.5 % GEL SMARTSIG:1 Drop(s) In Eye(s) Twice Daily PRN 12/06/18  Yes [provider]  mupirocin ointment (BACTROBAN) 2 % Place 1 application into the nose 2 (two) times daily. Apply intranasally bid for 5 days. 05/24/19  Yes Burchette, Alinda Sierras, MD  pantoprazole (PROTONIX) 40 MG tablet TAKE 1 TABLET BY MOUTH TWO TIMES DAILY.  04/18/19  Yes Burchette, Alinda Sierras, MD  RESTASIS 0.05 % ophthalmic emulsion Place 1 drop into both eyes 2 (two) times daily.  11/20/12  Yes [provider]  tacrolimus (PROTOPIC) 0.1 % ointment APPLY TO AFFECTED AREA ONCE A DAY 01/13/19  Yes [provider]  tamsulosin (FLOMAX) 0.4 MG CAPS capsule TAKE 1 CAPSULE (0.4 MG TOTAL) BY MOUTH DAILY AFTER SUPPER. 12/22/18  Yes Burchette, Alinda Sierras, MD  triamcinolone cream (KENALOG) 0.1 % APPLY TOPICALLY TO THE AFFECTED AREA 2 (TWO) TIMES DAILY AS NEEDED. 02/28/19  Yes Burchette, Alinda Sierras, MD  urea (CARMOL) 10 % cream APPLY TO AFFECTED AREA TWICE A DAY AS NEEDED 04/28/16  Yes [provider]  urea (CARMOL) 40 % CREA APPLY TO AFFECTED AREA AS NEEDED EVERY DAY 11/29/18  Yes [provider]  Urea 40 % LOTN Apply 1 application topically 2 (two) times a week. 01/02/17  Yes [provider]  zolpidem (AMBIEN) 10 MG tablet Take 10 mg by mouth at bedtime as needed for sleep.   Yes [provider]     Positive ROS: Otherwise negative  All other systems have been reviewed and were otherwise negative with the exception of those mentioned in the HPI and as above.  Physical Exam: Constitutional: Alert, well-appearing, no acute distress Ears: External ears without lesions or tenderness. Ear canals are clear bilaterally with intact, clear TMs bilaterally with normal mobility pneumatic otoscopy.  On hearing screening with a 512 1024 tuning fork he had  minimal hearing loss. Nasal: External nose without lesions. Septum with mild deformity to the left.  He had moderate swelling of the mucous membranes with large turbinates.  After decongesting the nose with Afrin I was able to perform nasal endoscopy.  On nasal endoscopy both middle meatus regions were clear.  Posterior ethmoid and sphenoid region was clear with no active mucopurulent discharge noted.  No polyps.  No signs of infection.  After decongesting the nose he felt much better and breathing much easier. Oral: Lips and gums without lesions. Tongue and palate mucosa without lesions. Posterior oropharynx clear. Neck: No palpable adenopathy or masses Respiratory: Breathing comfortably  Skin: No facial/neck lesions or rash noted.    Assessment: Nasal congestion secondary to chronic rhinitis.  On clinical exam today presently no evidence of active infection requiring further antibiotic therapy. Tinnitus secondary to history of previous noise exposure.  Minimal hearing problems.  Plan: Reviewed with him today concerning on clinical exam no evidence of active infection requiring further antibiotic therapy. Would recommend continuation with his use of nasal steroid spray Flonase but recommended using this at night 2 sprays each nostril.  He will also continue with use of azelastine nasal spray 2 sprays in the morning and 1 spray at night. Suggested use of saline nasal irrigation on a as needed basis and suggested trying Xlear brand. He has been using mupirocin 2% ointment for any scabbing or crusting in the nose he will continue this as needed. He will follow-up as needed any worsening of his sinus symptoms. Of note he had a CT scan of his sinuses performed in 2014 by [facility] that showed only minimal sinus disease.   Radene Journey, MD   CC:

## 2019-06-05 ENCOUNTER — Other Ambulatory Visit: Payer: Self-pay | Admitting: Cardiology

## 2019-06-06 DIAGNOSIS — F901 Attention-deficit hyperactivity disorder, predominantly hyperactive type: Secondary | ICD-10-CM | POA: Diagnosis not present

## 2019-06-06 DIAGNOSIS — M5136 Other intervertebral disc degeneration, lumbar region: Secondary | ICD-10-CM | POA: Diagnosis not present

## 2019-06-06 DIAGNOSIS — M50923 Unspecified cervical disc disorder at C6-C7 level: Secondary | ICD-10-CM | POA: Diagnosis not present

## 2019-06-06 DIAGNOSIS — M25511 Pain in right shoulder: Secondary | ICD-10-CM | POA: Diagnosis not present

## 2019-06-06 DIAGNOSIS — F429 Obsessive-compulsive disorder, unspecified: Secondary | ICD-10-CM | POA: Diagnosis not present

## 2019-06-06 DIAGNOSIS — M50922 Unspecified cervical disc disorder at C5-C6 level: Secondary | ICD-10-CM | POA: Diagnosis not present

## 2019-06-10 ENCOUNTER — Other Ambulatory Visit: Payer: BC Managed Care – PPO

## 2019-06-10 DIAGNOSIS — G5761 Lesion of plantar nerve, right lower limb: Secondary | ICD-10-CM | POA: Diagnosis not present

## 2019-06-10 DIAGNOSIS — M7052 Other bursitis of knee, left knee: Secondary | ICD-10-CM | POA: Diagnosis not present

## 2019-06-10 DIAGNOSIS — M25571 Pain in right ankle and joints of right foot: Secondary | ICD-10-CM | POA: Diagnosis not present

## 2019-06-10 DIAGNOSIS — M25562 Pain in left knee: Secondary | ICD-10-CM | POA: Diagnosis not present

## 2019-06-12 ENCOUNTER — Other Ambulatory Visit: Payer: Self-pay | Admitting: Family Medicine

## 2019-06-13 DIAGNOSIS — M50923 Unspecified cervical disc disorder at C6-C7 level: Secondary | ICD-10-CM | POA: Diagnosis not present

## 2019-06-13 DIAGNOSIS — M5136 Other intervertebral disc degeneration, lumbar region: Secondary | ICD-10-CM | POA: Diagnosis not present

## 2019-06-13 DIAGNOSIS — M25511 Pain in right shoulder: Secondary | ICD-10-CM | POA: Diagnosis not present

## 2019-06-13 DIAGNOSIS — M50922 Unspecified cervical disc disorder at C5-C6 level: Secondary | ICD-10-CM | POA: Diagnosis not present

## 2019-06-20 ENCOUNTER — Encounter: Payer: Self-pay | Admitting: Family Medicine

## 2019-06-20 ENCOUNTER — Other Ambulatory Visit: Payer: Self-pay | Admitting: Family Medicine

## 2019-06-20 DIAGNOSIS — M25511 Pain in right shoulder: Secondary | ICD-10-CM | POA: Diagnosis not present

## 2019-06-20 DIAGNOSIS — M50922 Unspecified cervical disc disorder at C5-C6 level: Secondary | ICD-10-CM | POA: Diagnosis not present

## 2019-06-20 DIAGNOSIS — M50923 Unspecified cervical disc disorder at C6-C7 level: Secondary | ICD-10-CM | POA: Diagnosis not present

## 2019-06-20 DIAGNOSIS — M5136 Other intervertebral disc degeneration, lumbar region: Secondary | ICD-10-CM | POA: Diagnosis not present

## 2019-06-27 DIAGNOSIS — M25571 Pain in right ankle and joints of right foot: Secondary | ICD-10-CM | POA: Diagnosis not present

## 2019-06-27 DIAGNOSIS — F901 Attention-deficit hyperactivity disorder, predominantly hyperactive type: Secondary | ICD-10-CM | POA: Diagnosis not present

## 2019-06-27 DIAGNOSIS — F429 Obsessive-compulsive disorder, unspecified: Secondary | ICD-10-CM | POA: Diagnosis not present

## 2019-06-27 DIAGNOSIS — G5761 Lesion of plantar nerve, right lower limb: Secondary | ICD-10-CM | POA: Diagnosis not present

## 2019-06-30 ENCOUNTER — Other Ambulatory Visit: Payer: Self-pay | Admitting: Family Medicine

## 2019-07-01 DIAGNOSIS — M50923 Unspecified cervical disc disorder at C6-C7 level: Secondary | ICD-10-CM | POA: Diagnosis not present

## 2019-07-01 DIAGNOSIS — M25511 Pain in right shoulder: Secondary | ICD-10-CM | POA: Diagnosis not present

## 2019-07-01 DIAGNOSIS — M5136 Other intervertebral disc degeneration, lumbar region: Secondary | ICD-10-CM | POA: Diagnosis not present

## 2019-07-01 DIAGNOSIS — M50922 Unspecified cervical disc disorder at C5-C6 level: Secondary | ICD-10-CM | POA: Diagnosis not present

## 2019-07-03 ENCOUNTER — Other Ambulatory Visit: Payer: Self-pay | Admitting: Family Medicine

## 2019-07-04 DIAGNOSIS — M25511 Pain in right shoulder: Secondary | ICD-10-CM | POA: Diagnosis not present

## 2019-07-04 DIAGNOSIS — M50923 Unspecified cervical disc disorder at C6-C7 level: Secondary | ICD-10-CM | POA: Diagnosis not present

## 2019-07-04 DIAGNOSIS — M50922 Unspecified cervical disc disorder at C5-C6 level: Secondary | ICD-10-CM | POA: Diagnosis not present

## 2019-07-04 DIAGNOSIS — M5136 Other intervertebral disc degeneration, lumbar region: Secondary | ICD-10-CM | POA: Diagnosis not present

## 2019-07-07 ENCOUNTER — Encounter: Payer: Self-pay | Admitting: Family Medicine

## 2019-07-08 ENCOUNTER — Other Ambulatory Visit: Payer: Self-pay

## 2019-07-08 ENCOUNTER — Encounter: Payer: Self-pay | Admitting: Family Medicine

## 2019-07-08 ENCOUNTER — Telehealth (INDEPENDENT_AMBULATORY_CARE_PROVIDER_SITE_OTHER): Payer: BC Managed Care – PPO | Admitting: Family Medicine

## 2019-07-08 VITALS — BP 110/60 | HR 64 | Temp 97.5°F

## 2019-07-08 DIAGNOSIS — R195 Other fecal abnormalities: Secondary | ICD-10-CM

## 2019-07-08 DIAGNOSIS — Z7189 Other specified counseling: Secondary | ICD-10-CM | POA: Diagnosis not present

## 2019-07-08 NOTE — Patient Instructions (Signed)
Food Choices to Help Relieve Diarrhea, Adult When you have diarrhea, the foods you eat and your eating habits are very important. Choosing the right foods and drinks can help:  Relieve diarrhea.  Replace lost fluids and nutrients.  Prevent dehydration. What general guidelines should I follow?  Relieving diarrhea  Choose foods with less than 2 g or .07 oz. of fiber per serving.  Limit fats to less than 8 tsp (38 g or 1.34 oz.) a day.  Avoid the following: ? Foods and beverages sweetened with high-fructose corn syrup, honey, or sugar alcohols such as xylitol, sorbitol, and mannitol. ? Foods that contain a lot of fat or sugar. ? Fried, greasy, or spicy foods. ? High-fiber grains, breads, and cereals. ? Raw fruits and vegetables.  Eat foods that are rich in probiotics. These foods include dairy products such as yogurt and fermented milk products. They help increase healthy bacteria in the stomach and intestines (gastrointestinal tract, or GI tract).  If you have lactose intolerance, avoid dairy products. These may make your diarrhea worse.  Take medicine to help stop diarrhea (antidiarrheal medicine) only as told by your health care provider. Replacing nutrients  Eat small meals or snacks every 3-4 hours.  Eat bland foods, such as white rice, toast, or baked potato, until your diarrhea starts to get better. Gradually reintroduce nutrient-rich foods as tolerated or as told by your health care provider. This includes: ? Well-cooked protein foods. ? Peeled, seeded, and soft-cooked fruits and vegetables. ? Low-fat dairy products.  Take vitamin and mineral supplements as told by your health care provider. Preventing dehydration  Start by sipping water or a special solution to prevent dehydration (oral rehydration solution, ORS). Urine that is clear or pale yellow means that you are getting enough fluid.  Try to drink at least 8-10 cups of fluid each day to help replace lost  fluids.  You may add other liquids in addition to water, such as clear juice or decaffeinated sports drinks, as tolerated or as told by your health care provider.  Avoid drinks with caffeine, such as coffee, tea, or soft drinks.  Avoid alcohol. What foods are recommended?     The items listed may not be a complete list. Talk with your health care provider about what dietary choices are best for you. Grains White rice. White, French, or pita breads (fresh or toasted), including plain rolls, buns, or bagels. White pasta. Saltine, soda, or graham crackers. Pretzels. Low-fiber cereal. Cooked cereals made with water (such as cornmeal, farina, or cream cereals). Plain muffins. Matzo. Melba toast. Zwieback. Vegetables Potatoes (without the skin). Most well-cooked and canned vegetables without skins or seeds. Tender lettuce. Fruits Apple sauce. Fruits canned in juice. Cooked apricots, cherries, grapefruit, peaches, pears, or plums. Fresh bananas and cantaloupe. Meats and other protein foods Baked or boiled chicken. Eggs. Tofu. Fish. Seafood. Smooth nut butters. Ground or well-cooked tender beef, ham, veal, lamb, pork, or poultry. Dairy Plain yogurt, kefir, and unsweetened liquid yogurt. Lactose-free milk, buttermilk, skim milk, or soy milk. Low-fat or nonfat hard cheese. Beverages Water. Low-calorie sports drinks. Fruit juices without pulp. Strained tomato and vegetable juices. Decaffeinated teas. Sugar-free beverages not sweetened with sugar alcohols. Oral rehydration solutions, if approved by your health care provider. Seasoning and other foods Bouillon, broth, or soups made from recommended foods. What foods are not recommended? The items listed may not be a complete list. Talk with your health care provider about what dietary choices are best for you. Grains Whole   grain, whole wheat, bran, or rye breads, rolls, pastas, and crackers. Wild or brown rice. Whole grain or bran cereals. Barley.  Oats and oatmeal. Corn tortillas or taco shells. Granola. Popcorn. Vegetables Raw vegetables. Fried vegetables. Cabbage, broccoli, Brussels sprouts, artichokes, baked beans, beet greens, corn, kale, legumes, peas, sweet potatoes, and yams. Potato skins. Cooked spinach and cabbage. Fruits Dried fruit, including raisins and dates. Raw fruits. Stewed or dried prunes. Canned fruits with syrup. Meat and other protein foods Fried or fatty meats. Deli meats. Chunky nut butters. Nuts and seeds. Beans and lentils. Berniece Salines. Hot dogs. Sausage. Dairy High-fat cheeses. Whole milk, chocolate milk, and beverages made with milk, such as milk shakes. Half-and-half. Cream. sour cream. Ice cream. Beverages Caffeinated beverages (such as coffee, tea, soda, or energy drinks). Alcoholic beverages. Fruit juices with pulp. Prune juice. Soft drinks sweetened with high-fructose corn syrup or sugar alcohols. High-calorie sports drinks. Fats and oils Butter. Cream sauces. Margarine. Salad oils. Plain salad dressings. Olives. Avocados. Mayonnaise. Sweets and desserts Sweet rolls, doughnuts, and sweet breads. Sugar-free desserts sweetened with sugar alcohols such as xylitol and sorbitol. Seasoning and other foods Honey. Hot sauce. Chili powder. Gravy. Cream-based or milk-based soups. Pancakes and waffles. Summary  When you have diarrhea, the foods you eat and your eating habits are very important.  Make sure you get at least 8-10 cups of fluid each day, or enough to keep your urine clear or pale yellow.  Eat bland foods and gradually reintroduce healthy, nutrient-rich foods as tolerated, or as told by your health care provider.  Avoid high-fiber, fried, greasy, or spicy foods. This information is not intended to replace advice given to you by your health care provider. Make sure you discuss any questions you have with your health care provider. Document Revised: 06/24/2018 Document Reviewed: 02/29/2016 Elsevier Patient  Education  2020 [place] Diet A bland diet consists of foods that are often soft and do not have a lot of fat, fiber, or extra seasonings. Foods without fat, fiber, or seasoning are easier for the body to digest. They are also less likely to irritate your mouth, throat, stomach, and other parts of your digestive system. A bland diet is sometimes called a BRAT diet. What is my plan? Your health care provider or food and nutrition specialist (dietitian) may recommend specific changes to your diet to prevent symptoms or to treat your symptoms. These changes may include:  Eating small meals often.  Cooking food until it is soft enough to chew easily.  Chewing your food well.  Drinking fluids slowly.  Not eating foods that are very spicy, sour, or fatty.  Not eating citrus fruits, such as oranges and grapefruit. What do I need to know about this diet?  Eat a variety of foods from the bland diet food list.  Do not follow a bland diet longer than needed.  Ask your health care provider whether you should take vitamins or supplements. What foods can I eat? Grains  Hot cereals, such as cream of wheat. Rice. Bread, crackers, or tortillas made from refined white flour. Vegetables Canned or cooked vegetables. Mashed or boiled potatoes. Fruits  Bananas. Applesauce. Other types of cooked or canned fruit with the skin and seeds removed, such as canned peaches or pears. Meats and other proteins  Scrambled eggs. Creamy peanut butter or other nut butters. Lean, well-cooked meats, such as chicken or fish. Tofu. Soups or broths. Dairy Low-fat dairy products, such as milk, cottage cheese, or yogurt. Beverages  Water. Herbal tea. Apple juice. Fats and oils Mild salad dressings. Canola or olive oil. Sweets and desserts Pudding. Custard. Fruit gelatin. Ice cream. The items listed above may not be a complete list of recommended foods and beverages. Contact a dietitian for more  options. What foods are not recommended? Grains Whole grain breads and cereals. Vegetables Raw vegetables. Fruits Raw fruits, especially citrus, berries, or dried fruits. Dairy Whole fat dairy foods. Beverages Caffeinated drinks. Alcohol. Seasonings and condiments Strongly flavored seasonings or condiments. Hot sauce. Salsa. Other foods Spicy foods. Fried foods. Sour foods, such as pickled or fermented foods. Foods with high sugar content. Foods high in fiber. The items listed above may not be a complete list of foods and beverages to avoid. Contact a dietitian for more information. Summary  A bland diet consists of foods that are often soft and do not have a lot of fat, fiber, or extra seasonings.  Foods without fat, fiber, or seasoning are easier for the body to digest.  Check with your health care provider to see how long you should follow this diet plan. It is not meant to be followed for long periods. This information is not intended to replace advice given to you by your health care provider. Make sure you discuss any questions you have with your health care provider. Document Revised: 04/01/2017 Document Reviewed: 04/01/2017 Elsevier Patient Education  [place].  Diarrhea, Adult Diarrhea is frequent loose and watery bowel movements. Diarrhea can make you feel weak and cause you to become dehydrated. Dehydration can make you tired and thirsty, cause you to have a dry mouth, and decrease how often you urinate. Diarrhea typically lasts 2-3 days. However, it can last longer if it is a sign of something more serious. It is important to treat your diarrhea as told by your health care provider. Follow these instructions at home: Eating and drinking     Follow these recommendations as told by your health care provider:  Take an oral rehydration solution (ORS). This is an over-the-counter medicine that helps return your body to its normal balance of nutrients and water.  It is found at pharmacies and retail stores.  Drink plenty of fluids, such as water, ice chips, diluted fruit juice, and low-calorie sports drinks. You can drink milk also, if desired.  Avoid drinking fluids that contain a lot of sugar or caffeine, such as energy drinks, sports drinks, and soda.  Eat bland, easy-to-digest foods in small amounts as you are able. These foods include bananas, applesauce, rice, lean meats, toast, and crackers.  Avoid alcohol.  Avoid spicy or fatty foods.  Medicines  Take over-the-counter and prescription medicines only as told by your health care provider.  If you were prescribed an antibiotic medicine, take it as told by your health care provider. Do not stop using the antibiotic even if you start to feel better. General instructions   Wash your hands often using soap and water. If soap and water are not available, use a hand sanitizer. Others in the household should wash their hands as well. Hands should be washed: ? After using the toilet or changing a diaper. ? Before preparing, cooking, or serving food. ? While caring for a sick person or while visiting someone in a hospital.  Drink enough fluid to keep your urine pale yellow.  Rest at home while you recover.  Watch your condition for any changes.  Take a warm bath to relieve any burning or pain from frequent diarrhea  episodes.  Keep all follow-up visits as told by your health care provider. This is important. Contact a health care provider if:  You have a fever.  Your diarrhea gets worse.  You have new symptoms.  You cannot keep fluids down.  You feel light-headed or dizzy.  You have a headache.  You have muscle cramps. Get help right away if:  You have chest pain.  You feel extremely weak or you faint.  You have bloody or black stools or stools that look like tar.  You have severe pain, cramping, or bloating in your abdomen.  You have trouble breathing or you are  breathing very quickly.  Your heart is beating very quickly.  Your skin feels cold and clammy.  You feel confused.  You have signs of dehydration, such as: ? Dark urine, very little urine, or no urine. ? Cracked lips. ? Dry mouth. ? Sunken eyes. ? Sleepiness. ? Weakness. Summary  Diarrhea is frequent loose and watery bowel movements. Diarrhea can make you feel weak and cause you to become dehydrated.  Drink enough fluids to keep your urine pale yellow.  Make sure that you wash your hands after using the toilet. If soap and water are not available, use hand sanitizer.  Contact a health care provider if your diarrhea gets worse or you have new symptoms.  Get help right away if you have signs of dehydration. This information is not intended to replace advice given to you by your health care provider. Make sure you discuss any questions you have with your health care provider. Document Revised: 07/20/2018 Document Reviewed: 08/07/2017 Elsevier Patient Education  [place].

## 2019-07-08 NOTE — Progress Notes (Signed)
Virtual Visit via Video Note  I connected with Peter Snyder on 07/08/19 at  3:00 PM EDT by a video enabled telemedicine application 2/2 [id number] pandemic and verified that I am speaking with the correct person using two identifiers.  Location patient: home Location provider:work or home office Persons participating in the virtual visit: patient, provider  I discussed the limitations of evaluation and management by telemedicine and the availability of in person appointments. The patient expressed understanding and agreed to proceed.   HPI: Pt is a 64 yo male with pmh sig for HTN, ischemic cardiomyopathy, CAD, GERD, diet control DM, neuropathy, essential tremor, arthritis, history of renal calculi, BPH, HLD, h/o depression, chronic insomnia who is followed by Peter Snyder.  Seen today for acute concern.  Endorses one loose stool on Tuesday with gurgling in stomach and gas.  Pt had a small formed stool this am and one loose stool today after lunch.  For lunch pt had peaches, salad, coleslaw.  Pt tried gas x and has been walking which helps with discomfort.  Pt denies fever (temp 96.5 F), n/v, HAs, sore throat, cough, sick contacts.  Pt had both COVID vaccines 1 month ago.    ROS: See pertinent positives and negatives per HPI.  Past Medical History:  Diagnosis Date  . Arthritis   . CAD (coronary artery disease) 6/14   Cardiac catheterization 6/27/ 2014 ejection fraction 35-40%, 30% proximal left circumflex, 100% tiny obtuse marginal 1 with collaterals, 50% LAD, 50% D1, 100% RCA with collaterals  . Chicken pox   . Colon polyps   . Depression   . Diabetes mellitus ([place])    Diet controlled  . GERD (gastroesophageal reflux disease)   . Hyperlipidemia   . Hypertension   . Kidney stone     Past Surgical History:  Procedure Laterality Date  . CRYO INTERCOSTAL NERVE BLOCK     double sacrum nerve block  . TONSILLECTOMY  1963  . TRIGGER FINGER RELEASE  2019    Family History  Problem  Relation Age of Onset  . Melanoma Mother        metastatic  . Arthritis Father   . Colon polyps Father   . Hyperlipidemia Maternal Grandmother   . Heart disease Maternal Grandmother   . Arthritis Paternal Grandmother   . Clotting disorder Paternal Grandmother   . Clotting disorder Paternal Grandfather   . Liver cancer Paternal Grandfather   . Colon cancer Neg Hx   . Esophageal cancer Neg Hx   . Rectal cancer Neg Hx      Current Outpatient Medications:  .  acetaminophen (TYLENOL) 500 MG tablet, Take 500 mg by mouth every 6 (six) hours as needed for moderate pain., Disp: , Rfl:  .  ALPRAZolam (XANAX) 0.25 MG tablet, Take 0.25 mg by mouth daily. , Disp: , Rfl:  .  aspirin 81 MG tablet, Take 81 mg by mouth daily., Disp: , Rfl:  .  atorvastatin (LIPITOR) 80 MG tablet, TAKE 1 TABLET BY MOUTH EVERY DAY, Disp: 90 tablet, Rfl: 1 .  azelastine (ASTELIN) 0.1 % nasal spray, PLACE 1 SPRAY INTO BOTH NOSTRILS 2 (TWO) TIMES DAILY AS DIRECTED, Disp: 30 mL, Rfl: 1 .  Bismuth Tribromoph-Petrolatum (XEROFORM PETROLAT PATCH 4"X4") PADS, USE AS DIRECTED TWICE A DAY EXTERNALLY 30 DAYS, Disp: , Rfl: 4 .  carvedilol (COREG) 12.5 MG tablet, TAKE 1 TABLET (12.5 MG TOTAL) BY MOUTH 2 (TWO) TIMES DAILY WITH A MEAL., Disp: 180 tablet, Rfl: 2 .  celecoxib (CELEBREX)  200 MG capsule, Take 200 mg by mouth daily. , Disp: , Rfl:  .  clidinium-chlordiazePOXIDE (LIBRAX) 5-2.5 MG capsule, Take 1 capsule by mouth 3 (three) times daily before meals., Disp: 60 capsule, Rfl: 0 .  clonazePAM (KLONOPIN) 0.5 MG tablet, Take 0.5 mg by mouth at bedtime. , Disp: , Rfl:  .  Dermatological Products, Misc. (LEVICYN) GEL, Apply 1 application topically 2 (two) times daily after a meal., Disp: , Rfl: 2 .  Dermatological Products, Misc. (TETRIX) CREA, Apply topically See admin instructions., Disp: , Rfl: 3 .  diclofenac Sodium (VOLTAREN) 1 % GEL, diclofenac 1 % topical gel, Disp: , Rfl:  .  ELIDEL 1 % cream, APPLY TO AFFECTED AREA TWICE A  DAY AS NEEDED FOR 14 DAYS, Disp: , Rfl: 2 .  escitalopram (LEXAPRO) 20 MG tablet, Take 20 mg by mouth daily., Disp: , Rfl:  .  ezetimibe (ZETIA) 10 MG tablet, TAKE 1 TABLET BY MOUTH EVERY DAY, Disp: 90 tablet, Rfl: 1 .  fluticasone (FLONASE) 50 MCG/ACT nasal spray, USE 2 SPRAYS IN EACH NOSTRIL EVERY DAY, Disp: 48 mL, Rfl: 2 .  gabapentin (NEURONTIN) 600 MG tablet, TAKE 1 TABLET BY MOUTH TWICE A DAY, Disp: 180 tablet, Rfl: 1 .  lisinopril (ZESTRIL) 20 MG tablet, Take 1 tablet (20 mg total) by mouth daily. **Patient needs OV for future refills**, Disp: 90 tablet, Rfl: 0 .  LOTEMAX 0.5 % GEL, SMARTSIG:1 Drop(s) In Eye(s) Twice Daily PRN, Disp: , Rfl:  .  mupirocin ointment (BACTROBAN) 2 %, Place 1 application into the nose 2 (two) times daily. Apply intranasally bid for 5 days., Disp: 22 g, Rfl: 1 .  pantoprazole (PROTONIX) 40 MG tablet, TAKE 1 TABLET BY MOUTH TWO TIMES DAILY., Disp: 180 tablet, Rfl: 3 .  RESTASIS 0.05 % ophthalmic emulsion, Place 1 drop into both eyes 2 (two) times daily. , Disp: , Rfl:  .  tacrolimus (PROTOPIC) 0.1 % ointment, APPLY TO AFFECTED AREA ONCE A DAY, Disp: , Rfl:  .  tamsulosin (FLOMAX) 0.4 MG CAPS capsule, TAKE 1 CAPSULE (0.4 MG TOTAL) BY MOUTH DAILY AFTER SUPPER., Disp: 90 capsule, Rfl: 1 .  triamcinolone cream (KENALOG) 0.1 %, APPLY TOPICALLY TO THE AFFECTED AREA 2 (TWO) TIMES DAILY AS NEEDED., Disp: 30 g, Rfl: 1 .  urea (CARMOL) 10 % cream, APPLY TO AFFECTED AREA TWICE A DAY AS NEEDED, Disp: , Rfl: 3 .  urea (CARMOL) 40 % CREA, APPLY TO AFFECTED AREA AS NEEDED EVERY DAY, Disp: , Rfl:  .  Urea 40 % LOTN, Apply 1 application topically 2 (two) times a week., Disp: , Rfl: 3 .  zolpidem (AMBIEN) 10 MG tablet, Take 10 mg by mouth at bedtime as needed for sleep., Disp: , Rfl:   EXAM:  VITALS per patient if applicable: RR between 123456 bpm, temp 96.64F  GENERAL: alert, oriented, appears well and in no acute distress  HEENT: atraumatic, conjunctiva clear, no obvious  abnormalities on inspection of external nose and ears  NECK: normal movements of the head and neck  LUNGS: on inspection no signs of respiratory distress, breathing rate appears normal, no obvious gross SOB, gasping or wheezing  CV: no obvious cyanosis  MS: moves all visible extremities without noticeable abnormality  PSYCH/NEURO: pleasant and cooperative, no obvious depression or anxiety, speech and thought processing grossly intact  ASSESSMENT AND PLAN:  Discussed the following assessment and plan:  Loose stools -Discussed possible causes including changes in diet, viral, or bacterial causes though less likely at  this time. -Discussed supportive care: Probiotic, taking small sips of water and/or electrolyte solutions, Imodium if needed, and frequent handwashing -Bland diet encouraged, advance as tolerated.  Avoid salads and other foods that are hard to digest -Given precautions for worsening or continued symptoms -For continued symptoms over the weekend consider Covid testing -Stool studies not indicated at this time.  Educated about COVID-19 virus infection -Discussed signs and symptoms of COVID-19 virus infection -Pt advised that despite receiving 2 doses of Covid vaccine it is still possible to become infected with COVID-19 -Advised vaccines will hopefully decrease symptoms if pt were to become infected as well as decrease the rights of hospitalization and death.  F/u prn   I discussed the assessment and treatment plan with the patient. The patient was provided an opportunity to ask questions and all were answered. The patient agreed with the plan and demonstrated an understanding of the instructions.   The patient was advised to call back or seek an in-person evaluation if the symptoms worsen or if the condition fails to improve as anticipated.   Billie Ruddy, MD

## 2019-07-11 ENCOUNTER — Other Ambulatory Visit (INDEPENDENT_AMBULATORY_CARE_PROVIDER_SITE_OTHER): Payer: BC Managed Care – PPO

## 2019-07-11 ENCOUNTER — Other Ambulatory Visit: Payer: Self-pay

## 2019-07-11 ENCOUNTER — Telehealth (INDEPENDENT_AMBULATORY_CARE_PROVIDER_SITE_OTHER): Payer: BC Managed Care – PPO | Admitting: Family Medicine

## 2019-07-11 DIAGNOSIS — E1165 Type 2 diabetes mellitus with hyperglycemia: Secondary | ICD-10-CM

## 2019-07-11 DIAGNOSIS — R300 Dysuria: Secondary | ICD-10-CM

## 2019-07-11 DIAGNOSIS — R195 Other fecal abnormalities: Secondary | ICD-10-CM | POA: Diagnosis not present

## 2019-07-11 DIAGNOSIS — M50922 Unspecified cervical disc disorder at C5-C6 level: Secondary | ICD-10-CM | POA: Diagnosis not present

## 2019-07-11 DIAGNOSIS — M50923 Unspecified cervical disc disorder at C6-C7 level: Secondary | ICD-10-CM | POA: Diagnosis not present

## 2019-07-11 DIAGNOSIS — M5136 Other intervertebral disc degeneration, lumbar region: Secondary | ICD-10-CM | POA: Diagnosis not present

## 2019-07-11 DIAGNOSIS — M25511 Pain in right shoulder: Secondary | ICD-10-CM | POA: Diagnosis not present

## 2019-07-11 LAB — HEMOGLOBIN A1C: Hgb A1c MFr Bld: 6.7 % — ABNORMAL HIGH (ref 4.6–6.5)

## 2019-07-11 MED ORDER — METFORMIN HCL 500 MG PO TABS: 500.0000 mg | ORAL_TABLET | Freq: Two times a day (BID) | ORAL | 3 refills | Status: DC

## 2019-07-11 NOTE — Progress Notes (Signed)
This visit type was conducted due to national recommendations for restrictions regarding the COVID-19 pandemic in an effort to limit this patient's exposure and mitigate transmission in our community.   Virtual Visit via Video Note  I connected with [facility] on 07/11/19 at  1:15 PM EDT by a video enabled telemedicine application and verified that I am speaking with the correct person using two identifiers.  Location patient: home Location provider:work or home office Persons participating in the virtual visit: patient, provider  I discussed the limitations of evaluation and management by telemedicine and the availability of in person appointments. The patient expressed understanding and agreed to proceed.   HPI: Peter Snyder had called with onset last Tuesday 1 episode of diarrhea.  This was nonbloody watery stool.  Since that time he had occasional loose stools.  No fever.  No chills.  No sick contacts.  No bloody stools.  No recent travels.  He ate a "brat" diet over the weekend and symptoms are no worse.  He is still been able to exercise.  No nausea or vomiting.  No abdominal pain.  He has history of hyperglycemia.  His A1c about a year ago was 6.1% and then it climbed to 6.6% in December.  We recommended follow-up now and he came in earlier today and had A1c 6.7%.  No polyuria or polydipsia.  He states he does eat a lot of carbs generally.  He is exercising regularly.  No family history of type 2 diabetes.  Never treated for diabetes previously.     ROS: See pertinent positives and negatives per HPI.  Past Medical History:  Diagnosis Date  . Arthritis   . CAD (coronary artery disease) 6/14   Cardiac catheterization 6/27/ 2014 ejection fraction 35-40%, 30% proximal left circumflex, 100% tiny obtuse marginal 1 with collaterals, 50% LAD, 50% D1, 100% RCA with collaterals  . Chicken pox   . Colon polyps   . Depression   . Diabetes mellitus ([place])    Diet controlled  . GERD  (gastroesophageal reflux disease)   . Hyperlipidemia   . Hypertension   . Kidney stone     Past Surgical History:  Procedure Laterality Date  . CRYO INTERCOSTAL NERVE BLOCK     double sacrum nerve block  . TONSILLECTOMY  1963  . TRIGGER FINGER RELEASE  2019    Family History  Problem Relation Age of Onset  . Melanoma Mother        metastatic  . Arthritis Father   . Colon polyps Father   . Hyperlipidemia Maternal Grandmother   . Heart disease Maternal Grandmother   . Arthritis Paternal Grandmother   . Clotting disorder Paternal Grandmother   . Clotting disorder Paternal Grandfather   . Liver cancer Paternal Grandfather   . Colon cancer Neg Hx   . Esophageal cancer Neg Hx   . Rectal cancer Neg Hx     SOCIAL HX: Non-smoker.  Retired from TEFL teacher   Current Outpatient Medications:  .  acetaminophen (TYLENOL) 500 MG tablet, Take 500 mg by mouth every 6 (six) hours as needed for moderate pain., Disp: , Rfl:  .  ALPRAZolam (XANAX) 0.25 MG tablet, Take 0.25 mg by mouth daily. , Disp: , Rfl:  .  aspirin 81 MG tablet, Take 81 mg by mouth daily., Disp: , Rfl:  .  atorvastatin (LIPITOR) 80 MG tablet, TAKE 1 TABLET BY MOUTH EVERY DAY, Disp: 90 tablet, Rfl: 1 .  azelastine (ASTELIN) 0.1 % nasal spray, PLACE  1 SPRAY INTO BOTH NOSTRILS 2 (TWO) TIMES DAILY AS DIRECTED, Disp: 30 mL, Rfl: 1 .  Bismuth Tribromoph-Petrolatum (XEROFORM PETROLAT PATCH 4"X4") PADS, USE AS DIRECTED TWICE A DAY EXTERNALLY 30 DAYS, Disp: , Rfl: 4 .  carvedilol (COREG) 12.5 MG tablet, TAKE 1 TABLET (12.5 MG TOTAL) BY MOUTH 2 (TWO) TIMES DAILY WITH A MEAL., Disp: 180 tablet, Rfl: 2 .  celecoxib (CELEBREX) 200 MG capsule, Take 200 mg by mouth daily. , Disp: , Rfl:  .  clidinium-chlordiazePOXIDE (LIBRAX) 5-2.5 MG capsule, Take 1 capsule by mouth 3 (three) times daily before meals., Disp: 60 capsule, Rfl: 0 .  clonazePAM (KLONOPIN) 0.5 MG tablet, Take 0.5 mg by mouth at bedtime. , Disp: , Rfl:  .   Dermatological Products, Misc. (LEVICYN) GEL, Apply 1 application topically 2 (two) times daily after a meal., Disp: , Rfl: 2 .  Dermatological Products, Misc. (TETRIX) CREA, Apply topically See admin instructions., Disp: , Rfl: 3 .  diclofenac Sodium (VOLTAREN) 1 % GEL, diclofenac 1 % topical gel, Disp: , Rfl:  .  ELIDEL 1 % cream, APPLY TO AFFECTED AREA TWICE A DAY AS NEEDED FOR 14 DAYS, Disp: , Rfl: 2 .  escitalopram (LEXAPRO) 20 MG tablet, Take 20 mg by mouth daily., Disp: , Rfl:  .  ezetimibe (ZETIA) 10 MG tablet, TAKE 1 TABLET BY MOUTH EVERY DAY, Disp: 90 tablet, Rfl: 1 .  fluticasone (FLONASE) 50 MCG/ACT nasal spray, USE 2 SPRAYS IN EACH NOSTRIL EVERY DAY, Disp: 48 mL, Rfl: 2 .  gabapentin (NEURONTIN) 600 MG tablet, TAKE 1 TABLET BY MOUTH TWICE A DAY, Disp: 180 tablet, Rfl: 1 .  lisinopril (ZESTRIL) 20 MG tablet, Take 1 tablet (20 mg total) by mouth daily. **Patient needs OV for future refills**, Disp: 90 tablet, Rfl: 0 .  LOTEMAX 0.5 % GEL, SMARTSIG:1 Drop(s) In Eye(s) Twice Daily PRN, Disp: , Rfl:  .  metFORMIN (GLUCOPHAGE) 500 MG tablet, Take 1 tablet (500 mg total) by mouth 2 (two) times daily with a meal., Disp: 180 tablet, Rfl: 3 .  mupirocin ointment (BACTROBAN) 2 %, Place 1 application into the nose 2 (two) times daily. Apply intranasally bid for 5 days., Disp: 22 g, Rfl: 1 .  pantoprazole (PROTONIX) 40 MG tablet, TAKE 1 TABLET BY MOUTH TWO TIMES DAILY., Disp: 180 tablet, Rfl: 3 .  RESTASIS 0.05 % ophthalmic emulsion, Place 1 drop into both eyes 2 (two) times daily. , Disp: , Rfl:  .  tacrolimus (PROTOPIC) 0.1 % ointment, APPLY TO AFFECTED AREA ONCE A DAY, Disp: , Rfl:  .  tamsulosin (FLOMAX) 0.4 MG CAPS capsule, TAKE 1 CAPSULE (0.4 MG TOTAL) BY MOUTH DAILY AFTER SUPPER., Disp: 90 capsule, Rfl: 1 .  triamcinolone cream (KENALOG) 0.1 %, APPLY TOPICALLY TO THE AFFECTED AREA 2 (TWO) TIMES DAILY AS NEEDED., Disp: 30 g, Rfl: 1 .  urea (CARMOL) 10 % cream, APPLY TO AFFECTED AREA TWICE A DAY  AS NEEDED, Disp: , Rfl: 3 .  urea (CARMOL) 40 % CREA, APPLY TO AFFECTED AREA AS NEEDED EVERY DAY, Disp: , Rfl:  .  Urea 40 % LOTN, Apply 1 application topically 2 (two) times a week., Disp: , Rfl: 3 .  zolpidem (AMBIEN) 10 MG tablet, Take 10 mg by mouth at bedtime as needed for sleep., Disp: , Rfl:   EXAM:  VITALS per patient if applicable:  GENERAL: alert, oriented, appears well and in no acute distress  HEENT: atraumatic, conjunttiva clear, no obvious abnormalities on inspection of external nose  and ears  NECK: normal movements of the head and neck  LUNGS: on inspection no signs of respiratory distress, breathing rate appears normal, no obvious gross SOB, gasping or wheezing  CV: no obvious cyanosis  MS: moves all visible extremities without noticeable abnormality  PSYCH/NEURO: pleasant and cooperative, no obvious depression or anxiety, speech and thought processing grossly intact  ASSESSMENT AND PLAN:  Discussed the following assessment and plan:  #1 loose stools.  Questionable viral.  He is on any fever, cough, dyspnea, etc.  Symptoms are slowly improving  -Continue plenty of hydration with water and bland diet -Touch base if symptoms not resolving in 1 week -Consider over-the-counter Imodium as needed  #2 type 2 diabetes currently untreated.  -We discussed setting up referral to diabetes and nutrition management center for further education and he agrees -Reduce high glycemic foods and continue regular exercise habits -We discussed starting Metformin 500 mg twice daily but not until after his current GI symptoms have fully passed for at least a week.  He has history of normal renal function -We recommended 2-month follow-up and reassess A1c then     I discussed the assessment and treatment plan with the patient. The patient was provided an opportunity to ask questions and all were answered. The patient agreed with the plan and demonstrated an understanding of the  instructions.   The patient was advised to call back or seek an in-person evaluation if the symptoms worsen or if the condition fails to improve as anticipated.     Carolann Littler, MD

## 2019-07-12 ENCOUNTER — Encounter: Payer: Self-pay | Admitting: Family Medicine

## 2019-07-12 DIAGNOSIS — F429 Obsessive-compulsive disorder, unspecified: Secondary | ICD-10-CM | POA: Diagnosis not present

## 2019-07-12 DIAGNOSIS — F901 Attention-deficit hyperactivity disorder, predominantly hyperactive type: Secondary | ICD-10-CM | POA: Diagnosis not present

## 2019-07-13 ENCOUNTER — Encounter: Payer: Self-pay | Admitting: Family Medicine

## 2019-07-13 DIAGNOSIS — L570 Actinic keratosis: Secondary | ICD-10-CM | POA: Diagnosis not present

## 2019-07-14 DIAGNOSIS — M25571 Pain in right ankle and joints of right foot: Secondary | ICD-10-CM | POA: Diagnosis not present

## 2019-07-14 DIAGNOSIS — G5761 Lesion of plantar nerve, right lower limb: Secondary | ICD-10-CM | POA: Diagnosis not present

## 2019-07-18 DIAGNOSIS — M50923 Unspecified cervical disc disorder at C6-C7 level: Secondary | ICD-10-CM | POA: Diagnosis not present

## 2019-07-18 DIAGNOSIS — M50922 Unspecified cervical disc disorder at C5-C6 level: Secondary | ICD-10-CM | POA: Diagnosis not present

## 2019-07-18 DIAGNOSIS — M25511 Pain in right shoulder: Secondary | ICD-10-CM | POA: Diagnosis not present

## 2019-07-18 DIAGNOSIS — M5136 Other intervertebral disc degeneration, lumbar region: Secondary | ICD-10-CM | POA: Diagnosis not present

## 2019-07-19 DIAGNOSIS — F429 Obsessive-compulsive disorder, unspecified: Secondary | ICD-10-CM | POA: Diagnosis not present

## 2019-07-19 DIAGNOSIS — F901 Attention-deficit hyperactivity disorder, predominantly hyperactive type: Secondary | ICD-10-CM | POA: Diagnosis not present

## 2019-07-20 ENCOUNTER — Encounter: Payer: Self-pay | Admitting: Family Medicine

## 2019-07-21 ENCOUNTER — Other Ambulatory Visit: Payer: Self-pay

## 2019-07-21 DIAGNOSIS — L570 Actinic keratosis: Secondary | ICD-10-CM | POA: Diagnosis not present

## 2019-07-22 ENCOUNTER — Ambulatory Visit (INDEPENDENT_AMBULATORY_CARE_PROVIDER_SITE_OTHER): Payer: BC Managed Care – PPO | Admitting: Family Medicine

## 2019-07-22 ENCOUNTER — Encounter: Payer: Self-pay | Admitting: Family Medicine

## 2019-07-22 VITALS — BP 110/78 | HR 59 | Temp 97.7°F | Ht 72.05 in | Wt 219.0 lb

## 2019-07-22 DIAGNOSIS — R252 Cramp and spasm: Secondary | ICD-10-CM

## 2019-07-22 DIAGNOSIS — E1165 Type 2 diabetes mellitus with hyperglycemia: Secondary | ICD-10-CM

## 2019-07-22 DIAGNOSIS — L853 Xerosis cutis: Secondary | ICD-10-CM | POA: Diagnosis not present

## 2019-07-22 MED ORDER — TRIAMCINOLONE ACETONIDE 0.1 % EX CREA: TOPICAL_CREAM | CUTANEOUS | 1 refills | Status: DC

## 2019-07-22 NOTE — Patient Instructions (Signed)

## 2019-07-22 NOTE — Progress Notes (Signed)
Subjective:     Patient ID: Peter Snyder, male   DOB: [birth year], 64 y.o.   MRN: [id number]  HPI   [name] is here for follow-up regarding type 2 diabetes.  A1c over a year ago was 6.1%.  Back in December his A1c was 6.6%.  Couple weeks ago this was up to 6.7%.  He has had multiple steroid injections including his back and feet from orthopedic specialist.  This may have had some role in exacerbating.  His diet is about the same.  He walks daily for exercise.  We recently recommended Metformin currently taking 500 mg once daily and tolerating without side effects.  He had several skin problems.  He recently saw dermatologist and had several things treated with liquid nitrogen.  He has very dry skin and has several itchy patches and requesting refills of triamcinolone 0.1% cream.  Frequent leg cramps.  He had electrolytes back in December which were stable.  He feels like he is staying fairly well-hydrated.  He is taken magnesium in the past which helped with his cramps but he had some diarrhea issues.  We had recommended multi B vitamin trial for his frequent cramps.  Past Medical History:  Diagnosis Date  . Arthritis   . CAD (coronary artery disease) 6/14   Cardiac catheterization 6/27/ 2014 ejection fraction 35-40%, 30% proximal left circumflex, 100% tiny obtuse marginal 1 with collaterals, 50% LAD, 50% D1, 100% RCA with collaterals  . Chicken pox   . Colon polyps   . Depression   . Diabetes mellitus ([place])    Diet controlled  . GERD (gastroesophageal reflux disease)   . Hyperlipidemia   . Hypertension   . Kidney stone    Past Surgical History:  Procedure Laterality Date  . CRYO INTERCOSTAL NERVE BLOCK     double sacrum nerve block  . TONSILLECTOMY  1963  . TRIGGER FINGER RELEASE  2019    reports that he quit smoking about 21 years ago. He started smoking about 49 years ago. He has a 7.00 pack-year smoking history. He has never used smokeless tobacco. He reports that he does not drink  alcohol or use drugs. family history includes Arthritis in his father and paternal grandmother; Clotting disorder in his paternal grandfather and paternal grandmother; Colon polyps in his father; Heart disease in his maternal grandmother; Hyperlipidemia in his maternal grandmother; Liver cancer in his paternal grandfather; Melanoma in his mother. Allergies  Allergen Reactions  . Latex Other (See Comments)    Other  . Sulfa Antibiotics Rash     Review of Systems  Constitutional: Negative for fatigue and unexpected weight change.  Eyes: Negative for visual disturbance.  Respiratory: Negative for cough, chest tightness and shortness of breath.   Cardiovascular: Negative for chest pain, palpitations and leg swelling.  Endocrine: Negative for polydipsia and polyuria.  Neurological: Negative for dizziness, syncope, weakness, light-headedness and headaches.       Objective:   Physical Exam Vitals reviewed.  Constitutional:      Appearance: Normal appearance.  Cardiovascular:     Rate and Rhythm: Normal rate and regular rhythm.  Pulmonary:     Effort: Pulmonary effort is normal.     Breath sounds: Normal breath sounds.  Musculoskeletal:     Right lower leg: No edema.     Left lower leg: No edema.  Skin:    Comments: Generally dry skin.  He has some areas of ichthyosis and increased scaling lower legs bilaterally  Neurological:  Mental Status: He is alert.        Assessment:     #1 type 2 diabetes.  Recent A1c 6.7%.  Is gradually increased over the past year.  Recent initiation of Metformin.  Diabetes probably exacerbated by recent steroid injections to some degree  #2 dry skin dermatitis  #3 frequent leg cramps    Plan:     -Patient is already set up to see a nutritionist regarding his diabetes and plans to work with him for a few months.  We recommended 84-month follow-up and recheck A1c.  If diabetes worsening down the road consider SGLT2 or GLP-1 medication with  cardiovascular benefits  -We discussed importance of regular aerobic exercise but also some resistance training in helping control his diabetes  -Refill triamcinolone 0.1% cream twice daily as needed for dry skin dermatitis  -Recommend multi complex B vitamin supplement and adequate hydration for leg cramps.  -89-month follow-up scheduled  [name] MD [facility]

## 2019-07-25 DIAGNOSIS — M25511 Pain in right shoulder: Secondary | ICD-10-CM | POA: Diagnosis not present

## 2019-07-25 DIAGNOSIS — M50923 Unspecified cervical disc disorder at C6-C7 level: Secondary | ICD-10-CM | POA: Diagnosis not present

## 2019-07-25 DIAGNOSIS — M50922 Unspecified cervical disc disorder at C5-C6 level: Secondary | ICD-10-CM | POA: Diagnosis not present

## 2019-07-25 DIAGNOSIS — M5136 Other intervertebral disc degeneration, lumbar region: Secondary | ICD-10-CM | POA: Diagnosis not present

## 2019-07-26 DIAGNOSIS — Z713 Dietary counseling and surveillance: Secondary | ICD-10-CM | POA: Diagnosis not present

## 2019-07-26 NOTE — Progress Notes (Signed)
HPI: FU coronary artery disease. Previous cardiac care in [place]. Cardiac catheterization performed in June of 2014 showed an ejection fraction of 35-40%. The inferior wall is akinetic. The left main was normal. There was a 30% proximal circumflex, occluded small first obtuse marginal with collaterals, a 50% ramus intermedius, 50% LAD, 50% first diagonal and an occluded right coronary artery with collaterals. Treated medically. Nuclear study January 2017 showed ejection fraction 35%. There was a prior inferior infarct with mild peri-infarct ischemia. Cardiac MRI May 2017 showed akinetic inferior wall with ejection fraction 40%. Biatrial enlargement and mild aortic/mitral regurgitation. Echocardiogram July 2020 showed ejection fraction 55% (I reviewed and felt EF 40-45), moderate left ventricular hypertrophy, grade 2 diastolic dysfunction, mild left atrial enlargement, mild to moderate mitral regurgitation and mildly dilated ascending aorta.  MRA March 2021 showed stable minor dilatation of ascending thoracic aorta measuring 3.9 cm.  Abdominal ultrasound December 2020 showed no aneurysm.  Since last seen, the patient denies any dyspnea on exertion, orthopnea, PND, pedal edema, palpitations, syncope or chest pain.   Current Outpatient Medications  Medication Sig Dispense Refill  . acetaminophen (TYLENOL) 500 MG tablet Take 500 mg by mouth every 6 (six) hours as needed for moderate pain.    Marland Kitchen ALPRAZolam (XANAX) 0.25 MG tablet Take 0.25 mg by mouth daily.     Marland Kitchen aspirin 81 MG tablet Take 81 mg by mouth daily.    Marland Kitchen atorvastatin (LIPITOR) 80 MG tablet TAKE 1 TABLET BY MOUTH EVERY DAY 90 tablet 1  . azelastine (ASTELIN) 0.1 % nasal spray PLACE 1 SPRAY INTO BOTH NOSTRILS 2 (TWO) TIMES DAILY AS DIRECTED 30 mL 1  . Bismuth Tribromoph-Petrolatum (XEROFORM PETROLAT PATCH 4"X4") PADS USE AS DIRECTED TWICE A DAY EXTERNALLY 30 DAYS  4  . carvedilol (COREG) 12.5 MG tablet TAKE 1 TABLET (12.5 MG TOTAL) BY MOUTH  2 (TWO) TIMES DAILY WITH A MEAL. 180 tablet 2  . celecoxib (CELEBREX) 200 MG capsule Take 200 mg by mouth daily.     . clidinium-chlordiazePOXIDE (LIBRAX) 5-2.5 MG capsule Take 1 capsule by mouth 3 (three) times daily before meals. 60 capsule 0  . clonazePAM (KLONOPIN) 0.5 MG tablet Take 0.5 mg by mouth at bedtime.     . Dermatological Products, Misc. (LEVICYN) GEL Apply 1 application topically 2 (two) times daily after a meal.  2  . Dermatological Products, Misc. (TETRIX) CREA Apply topically See admin instructions.  3  . diclofenac Sodium (VOLTAREN) 1 % GEL diclofenac 1 % topical gel    . ELIDEL 1 % cream APPLY TO AFFECTED AREA TWICE A DAY AS NEEDED FOR 14 DAYS  2  . escitalopram (LEXAPRO) 20 MG tablet Take 20 mg by mouth daily.    Marland Kitchen ezetimibe (ZETIA) 10 MG tablet TAKE 1 TABLET BY MOUTH EVERY DAY 90 tablet 1  . fluticasone (FLONASE) 50 MCG/ACT nasal spray USE 2 SPRAYS IN EACH NOSTRIL EVERY DAY 48 mL 2  . gabapentin (NEURONTIN) 600 MG tablet TAKE 1 TABLET BY MOUTH TWICE A DAY 180 tablet 1  . lisinopril (ZESTRIL) 20 MG tablet Take 1 tablet (20 mg total) by mouth daily. **Patient needs OV for future refills** 90 tablet 0  . LOTEMAX 0.5 % GEL SMARTSIG:1 Drop(s) In Eye(s) Twice Daily PRN    . metFORMIN (GLUCOPHAGE) 500 MG tablet Take 1 tablet (500 mg total) by mouth 2 (two) times daily with a meal. 180 tablet 3  . mupirocin ointment (BACTROBAN) 2 % Place 1 application into  the nose 2 (two) times daily. Apply intranasally bid for 5 days. 22 g 1  . pantoprazole (PROTONIX) 40 MG tablet TAKE 1 TABLET BY MOUTH TWO TIMES DAILY. 180 tablet 3  . RESTASIS 0.05 % ophthalmic emulsion Place 1 drop into both eyes 2 (two) times daily.     . tacrolimus (PROTOPIC) 0.1 % ointment APPLY TO AFFECTED AREA ONCE A DAY    . tamsulosin (FLOMAX) 0.4 MG CAPS capsule TAKE 1 CAPSULE (0.4 MG TOTAL) BY MOUTH DAILY AFTER SUPPER. 90 capsule 1  . triamcinolone cream (KENALOG) 0.1 % APPLY TOPICALLY TO THE AFFECTED AREA 2 (TWO) TIMES  DAILY AS NEEDED. 80 g 1  . urea (CARMOL) 10 % cream APPLY TO AFFECTED AREA TWICE A DAY AS NEEDED  3  . urea (CARMOL) 40 % CREA APPLY TO AFFECTED AREA AS NEEDED EVERY DAY    . Urea 40 % LOTN Apply 1 application topically 2 (two) times a week.  3  . zolpidem (AMBIEN) 10 MG tablet Take 10 mg by mouth at bedtime as needed for sleep.     No current facility-administered medications for this visit.     Past Medical History:  Diagnosis Date  . Arthritis   . CAD (coronary artery disease) 6/14   Cardiac catheterization 6/27/ 2014 ejection fraction 35-40%, 30% proximal left circumflex, 100% tiny obtuse marginal 1 with collaterals, 50% LAD, 50% D1, 100% RCA with collaterals  . Chicken pox   . Colon polyps   . Depression   . Diabetes mellitus ([place])    Diet controlled  . GERD (gastroesophageal reflux disease)   . Hyperlipidemia   . Hypertension   . Kidney stone     Past Surgical History:  Procedure Laterality Date  . CRYO INTERCOSTAL NERVE BLOCK     double sacrum nerve block  . TONSILLECTOMY  1963  . TRIGGER FINGER RELEASE  2019    Social History   Socioeconomic History  . Marital status: Married    Spouse name: Not on file  . Number of children: 3  . Years of education: Not on file  . Highest education level: Not on file  Occupational History  . Occupation: retired    Comment: Retired  Tobacco Use  . Smoking status: Former Smoker    Packs/day: 0.25    Years: 28.00    Pack years: 7.00    Start date: 12    Quit date: 2000    Years since quitting: 21.3  . Smokeless tobacco: Never Used  Substance and Sexual Activity  . Alcohol use: No    Alcohol/week: 0.0 standard drinks  . Drug use: No  . Sexual activity: Not on file  Other Topics Concern  . Not on file  Social History Narrative  . Not on file   Social Determinants of Health   Financial Resource Strain:   . Difficulty of Paying Living Expenses:   Food Insecurity:   . Worried About Charity fundraiser in the  Last Year:   . Arboriculturist in the Last Year:   Transportation Needs:   . Film/video editor (Medical):   Marland Kitchen Lack of Transportation (Non-Medical):   Physical Activity:   . Days of Exercise per Week:   . Minutes of Exercise per Session:   Stress:   . Feeling of Stress :   Social Connections:   . Frequency of Communication with Friends and Family:   . Frequency of Social Gatherings with Friends and Family:   .  Attends Religious Services:   . Active Member of Clubs or Organizations:   . Attends Archivist Meetings:   Marland Kitchen Marital Status:   Intimate Partner Violence:   . Fear of Current or Ex-Partner:   . Emotionally Abused:   Marland Kitchen Physically Abused:   . Sexually Abused:     Family History  Problem Relation Age of Onset  . Melanoma Mother        metastatic  . Arthritis Father   . Colon polyps Father   . Hyperlipidemia Maternal Grandmother   . Heart disease Maternal Grandmother   . Arthritis Paternal Grandmother   . Clotting disorder Paternal Grandmother   . Clotting disorder Paternal Grandfather   . Liver cancer Paternal Grandfather   . Colon cancer Neg Hx   . Esophageal cancer Neg Hx   . Rectal cancer Neg Hx     ROS: no fevers or chills, productive cough, hemoptysis, dysphasia, odynophagia, melena, hematochezia, dysuria, hematuria, rash, seizure activity, orthopnea, PND, pedal edema, claudication. Remaining systems are negative.  Physical Exam: Well-developed well-nourished in no acute distress.  Skin is warm and dry.  HEENT is normal.  Neck is supple.  Chest is clear to auscultation with normal expansion.  Cardiovascular exam is regular rate and rhythm.  Abdominal exam nontender or distended. No masses palpated. Extremities show no edema. neuro grossly intact  ECG-normal sinus rhythm, inferior posterior infarct.  Personally reviewed  A/P  1 coronary artery disease-patient denies chest pain.  Continue aspirin and statin.  2 hypertension-blood  pressure controlled.  Continue present medical regimen.  3 hyperlipidemia-continue statin.  Most recent LDL 86.  We will continue with Lipitor and Zetia.  He will work on his diet.  Recheck lipids in 3 months.  If LDL greater than 70 would consider Repatha or Praluent.  4 dilated aortic root-plan follow-up MRA in March 2023.  5 ischemic cardiomyopathy-continue ACE inhibitor and beta-blocker.  Most recent echocardiogram shows ejection fraction 40 to 45% by my review.  Kirk Ruths, MD

## 2019-07-27 DIAGNOSIS — H25813 Combined forms of age-related cataract, bilateral: Secondary | ICD-10-CM | POA: Diagnosis not present

## 2019-07-27 DIAGNOSIS — H43812 Vitreous degeneration, left eye: Secondary | ICD-10-CM | POA: Diagnosis not present

## 2019-07-27 DIAGNOSIS — H04123 Dry eye syndrome of bilateral lacrimal glands: Secondary | ICD-10-CM | POA: Diagnosis not present

## 2019-07-27 DIAGNOSIS — H02422 Myogenic ptosis of left eyelid: Secondary | ICD-10-CM | POA: Diagnosis not present

## 2019-07-29 DIAGNOSIS — H02422 Myogenic ptosis of left eyelid: Secondary | ICD-10-CM | POA: Diagnosis not present

## 2019-07-29 DIAGNOSIS — M65872 Other synovitis and tenosynovitis, left ankle and foot: Secondary | ICD-10-CM | POA: Diagnosis not present

## 2019-07-29 DIAGNOSIS — G5762 Lesion of plantar nerve, left lower limb: Secondary | ICD-10-CM | POA: Diagnosis not present

## 2019-07-29 DIAGNOSIS — H16223 Keratoconjunctivitis sicca, not specified as Sjogren's, bilateral: Secondary | ICD-10-CM | POA: Diagnosis not present

## 2019-07-29 DIAGNOSIS — H0102B Squamous blepharitis left eye, upper and lower eyelids: Secondary | ICD-10-CM | POA: Diagnosis not present

## 2019-07-29 DIAGNOSIS — H0102A Squamous blepharitis right eye, upper and lower eyelids: Secondary | ICD-10-CM | POA: Diagnosis not present

## 2019-07-29 LAB — HM DIABETES EYE EXAM

## 2019-08-01 DIAGNOSIS — M5136 Other intervertebral disc degeneration, lumbar region: Secondary | ICD-10-CM | POA: Diagnosis not present

## 2019-08-01 DIAGNOSIS — M25511 Pain in right shoulder: Secondary | ICD-10-CM | POA: Diagnosis not present

## 2019-08-01 DIAGNOSIS — M50923 Unspecified cervical disc disorder at C6-C7 level: Secondary | ICD-10-CM | POA: Diagnosis not present

## 2019-08-01 DIAGNOSIS — M50922 Unspecified cervical disc disorder at C5-C6 level: Secondary | ICD-10-CM | POA: Diagnosis not present

## 2019-08-02 ENCOUNTER — Encounter: Payer: Self-pay | Admitting: Cardiology

## 2019-08-02 ENCOUNTER — Other Ambulatory Visit: Payer: Self-pay

## 2019-08-02 ENCOUNTER — Ambulatory Visit (INDEPENDENT_AMBULATORY_CARE_PROVIDER_SITE_OTHER): Payer: BC Managed Care – PPO | Admitting: Cardiology

## 2019-08-02 VITALS — BP 122/62 | HR 63 | Ht 74.0 in | Wt 217.0 lb

## 2019-08-02 DIAGNOSIS — I712 Thoracic aortic aneurysm, without rupture, unspecified: Secondary | ICD-10-CM

## 2019-08-02 DIAGNOSIS — E7800 Pure hypercholesterolemia, unspecified: Secondary | ICD-10-CM | POA: Diagnosis not present

## 2019-08-02 DIAGNOSIS — I255 Ischemic cardiomyopathy: Secondary | ICD-10-CM

## 2019-08-02 DIAGNOSIS — F901 Attention-deficit hyperactivity disorder, predominantly hyperactive type: Secondary | ICD-10-CM | POA: Diagnosis not present

## 2019-08-02 DIAGNOSIS — I2510 Atherosclerotic heart disease of native coronary artery without angina pectoris: Secondary | ICD-10-CM

## 2019-08-02 DIAGNOSIS — I10 Essential (primary) hypertension: Secondary | ICD-10-CM

## 2019-08-02 DIAGNOSIS — F429 Obsessive-compulsive disorder, unspecified: Secondary | ICD-10-CM | POA: Diagnosis not present

## 2019-08-02 NOTE — Patient Instructions (Signed)

## 2019-08-03 ENCOUNTER — Encounter: Payer: Self-pay | Admitting: Family Medicine

## 2019-08-11 DIAGNOSIS — M25562 Pain in left knee: Secondary | ICD-10-CM | POA: Diagnosis not present

## 2019-08-16 DIAGNOSIS — M50922 Unspecified cervical disc disorder at C5-C6 level: Secondary | ICD-10-CM | POA: Diagnosis not present

## 2019-08-16 DIAGNOSIS — M50923 Unspecified cervical disc disorder at C6-C7 level: Secondary | ICD-10-CM | POA: Diagnosis not present

## 2019-08-16 DIAGNOSIS — M25511 Pain in right shoulder: Secondary | ICD-10-CM | POA: Diagnosis not present

## 2019-08-16 DIAGNOSIS — M5136 Other intervertebral disc degeneration, lumbar region: Secondary | ICD-10-CM | POA: Diagnosis not present

## 2019-08-18 DIAGNOSIS — M5136 Other intervertebral disc degeneration, lumbar region: Secondary | ICD-10-CM | POA: Diagnosis not present

## 2019-08-18 DIAGNOSIS — M50922 Unspecified cervical disc disorder at C5-C6 level: Secondary | ICD-10-CM | POA: Diagnosis not present

## 2019-08-18 DIAGNOSIS — M25511 Pain in right shoulder: Secondary | ICD-10-CM | POA: Diagnosis not present

## 2019-08-18 DIAGNOSIS — M50923 Unspecified cervical disc disorder at C6-C7 level: Secondary | ICD-10-CM | POA: Diagnosis not present

## 2019-08-22 DIAGNOSIS — M542 Cervicalgia: Secondary | ICD-10-CM | POA: Diagnosis not present

## 2019-08-23 DIAGNOSIS — Z713 Dietary counseling and surveillance: Secondary | ICD-10-CM | POA: Diagnosis not present

## 2019-08-29 DIAGNOSIS — M25511 Pain in right shoulder: Secondary | ICD-10-CM | POA: Diagnosis not present

## 2019-08-29 DIAGNOSIS — M50923 Unspecified cervical disc disorder at C6-C7 level: Secondary | ICD-10-CM | POA: Diagnosis not present

## 2019-08-29 DIAGNOSIS — M50922 Unspecified cervical disc disorder at C5-C6 level: Secondary | ICD-10-CM | POA: Diagnosis not present

## 2019-08-29 DIAGNOSIS — M5136 Other intervertebral disc degeneration, lumbar region: Secondary | ICD-10-CM | POA: Diagnosis not present

## 2019-08-30 DIAGNOSIS — F901 Attention-deficit hyperactivity disorder, predominantly hyperactive type: Secondary | ICD-10-CM | POA: Diagnosis not present

## 2019-08-30 DIAGNOSIS — M545 Low back pain: Secondary | ICD-10-CM | POA: Diagnosis not present

## 2019-08-30 DIAGNOSIS — F429 Obsessive-compulsive disorder, unspecified: Secondary | ICD-10-CM | POA: Diagnosis not present

## 2019-09-01 ENCOUNTER — Other Ambulatory Visit: Payer: Self-pay | Admitting: Cardiology

## 2019-09-01 ENCOUNTER — Ambulatory Visit: Payer: BC Managed Care – PPO | Admitting: Dietician

## 2019-09-02 DIAGNOSIS — H16223 Keratoconjunctivitis sicca, not specified as Sjogren's, bilateral: Secondary | ICD-10-CM | POA: Diagnosis not present

## 2019-09-02 DIAGNOSIS — H0102A Squamous blepharitis right eye, upper and lower eyelids: Secondary | ICD-10-CM | POA: Diagnosis not present

## 2019-09-02 DIAGNOSIS — H43812 Vitreous degeneration, left eye: Secondary | ICD-10-CM | POA: Diagnosis not present

## 2019-09-02 DIAGNOSIS — M542 Cervicalgia: Secondary | ICD-10-CM | POA: Diagnosis not present

## 2019-09-02 DIAGNOSIS — H0102B Squamous blepharitis left eye, upper and lower eyelids: Secondary | ICD-10-CM | POA: Diagnosis not present

## 2019-09-02 DIAGNOSIS — M545 Low back pain: Secondary | ICD-10-CM | POA: Diagnosis not present

## 2019-09-05 DIAGNOSIS — M25511 Pain in right shoulder: Secondary | ICD-10-CM | POA: Diagnosis not present

## 2019-09-05 DIAGNOSIS — M50923 Unspecified cervical disc disorder at C6-C7 level: Secondary | ICD-10-CM | POA: Diagnosis not present

## 2019-09-05 DIAGNOSIS — M50922 Unspecified cervical disc disorder at C5-C6 level: Secondary | ICD-10-CM | POA: Diagnosis not present

## 2019-09-05 DIAGNOSIS — M5136 Other intervertebral disc degeneration, lumbar region: Secondary | ICD-10-CM | POA: Diagnosis not present

## 2019-09-12 DIAGNOSIS — M50923 Unspecified cervical disc disorder at C6-C7 level: Secondary | ICD-10-CM | POA: Diagnosis not present

## 2019-09-12 DIAGNOSIS — M50922 Unspecified cervical disc disorder at C5-C6 level: Secondary | ICD-10-CM | POA: Diagnosis not present

## 2019-09-12 DIAGNOSIS — M5136 Other intervertebral disc degeneration, lumbar region: Secondary | ICD-10-CM | POA: Diagnosis not present

## 2019-09-12 DIAGNOSIS — M25511 Pain in right shoulder: Secondary | ICD-10-CM | POA: Diagnosis not present

## 2019-09-13 DIAGNOSIS — L570 Actinic keratosis: Secondary | ICD-10-CM | POA: Diagnosis not present

## 2019-09-13 DIAGNOSIS — L820 Inflamed seborrheic keratosis: Secondary | ICD-10-CM | POA: Diagnosis not present

## 2019-09-14 ENCOUNTER — Other Ambulatory Visit: Payer: Self-pay | Admitting: Family Medicine

## 2019-09-14 DIAGNOSIS — M542 Cervicalgia: Secondary | ICD-10-CM | POA: Diagnosis not present

## 2019-09-15 ENCOUNTER — Telehealth: Payer: Self-pay | Admitting: Family Medicine

## 2019-09-15 NOTE — Telephone Encounter (Signed)
FYI Pt has enrolled in case management progrm with [facility]  Per Cleone Slim  [phone] wanted to inform Dr

## 2019-09-16 DIAGNOSIS — M5137 Other intervertebral disc degeneration, lumbosacral region: Secondary | ICD-10-CM | POA: Diagnosis not present

## 2019-09-17 ENCOUNTER — Other Ambulatory Visit: Payer: Self-pay | Admitting: Cardiology

## 2019-09-20 DIAGNOSIS — Z713 Dietary counseling and surveillance: Secondary | ICD-10-CM | POA: Diagnosis not present

## 2019-09-20 DIAGNOSIS — M5136 Other intervertebral disc degeneration, lumbar region: Secondary | ICD-10-CM | POA: Diagnosis not present

## 2019-09-20 DIAGNOSIS — M25511 Pain in right shoulder: Secondary | ICD-10-CM | POA: Diagnosis not present

## 2019-09-20 DIAGNOSIS — M50922 Unspecified cervical disc disorder at C5-C6 level: Secondary | ICD-10-CM | POA: Diagnosis not present

## 2019-09-20 DIAGNOSIS — M50923 Unspecified cervical disc disorder at C6-C7 level: Secondary | ICD-10-CM | POA: Diagnosis not present

## 2019-09-26 DIAGNOSIS — M50923 Unspecified cervical disc disorder at C6-C7 level: Secondary | ICD-10-CM | POA: Diagnosis not present

## 2019-09-26 DIAGNOSIS — M5136 Other intervertebral disc degeneration, lumbar region: Secondary | ICD-10-CM | POA: Diagnosis not present

## 2019-09-26 DIAGNOSIS — M50922 Unspecified cervical disc disorder at C5-C6 level: Secondary | ICD-10-CM | POA: Diagnosis not present

## 2019-09-26 DIAGNOSIS — M25511 Pain in right shoulder: Secondary | ICD-10-CM | POA: Diagnosis not present

## 2019-09-27 DIAGNOSIS — G5761 Lesion of plantar nerve, right lower limb: Secondary | ICD-10-CM | POA: Diagnosis not present

## 2019-09-27 DIAGNOSIS — M7751 Other enthesopathy of right foot: Secondary | ICD-10-CM | POA: Diagnosis not present

## 2019-09-27 DIAGNOSIS — M7752 Other enthesopathy of left foot: Secondary | ICD-10-CM | POA: Diagnosis not present

## 2019-09-27 DIAGNOSIS — G5763 Lesion of plantar nerve, bilateral lower limbs: Secondary | ICD-10-CM | POA: Diagnosis not present

## 2019-10-03 DIAGNOSIS — M25511 Pain in right shoulder: Secondary | ICD-10-CM | POA: Diagnosis not present

## 2019-10-03 DIAGNOSIS — M50922 Unspecified cervical disc disorder at C5-C6 level: Secondary | ICD-10-CM | POA: Diagnosis not present

## 2019-10-03 DIAGNOSIS — M50923 Unspecified cervical disc disorder at C6-C7 level: Secondary | ICD-10-CM | POA: Diagnosis not present

## 2019-10-03 DIAGNOSIS — M5136 Other intervertebral disc degeneration, lumbar region: Secondary | ICD-10-CM | POA: Diagnosis not present

## 2019-10-09 ENCOUNTER — Other Ambulatory Visit: Payer: Self-pay | Admitting: Family Medicine

## 2019-10-10 DIAGNOSIS — M50922 Unspecified cervical disc disorder at C5-C6 level: Secondary | ICD-10-CM | POA: Diagnosis not present

## 2019-10-10 DIAGNOSIS — M5136 Other intervertebral disc degeneration, lumbar region: Secondary | ICD-10-CM | POA: Diagnosis not present

## 2019-10-10 DIAGNOSIS — M50923 Unspecified cervical disc disorder at C6-C7 level: Secondary | ICD-10-CM | POA: Diagnosis not present

## 2019-10-10 DIAGNOSIS — M25511 Pain in right shoulder: Secondary | ICD-10-CM | POA: Diagnosis not present

## 2019-10-12 DIAGNOSIS — L570 Actinic keratosis: Secondary | ICD-10-CM | POA: Diagnosis not present

## 2019-10-13 DIAGNOSIS — M65871 Other synovitis and tenosynovitis, right ankle and foot: Secondary | ICD-10-CM | POA: Diagnosis not present

## 2019-10-13 DIAGNOSIS — G5761 Lesion of plantar nerve, right lower limb: Secondary | ICD-10-CM | POA: Diagnosis not present

## 2019-10-14 ENCOUNTER — Encounter: Payer: Self-pay | Admitting: Family Medicine

## 2019-10-17 DIAGNOSIS — M50922 Unspecified cervical disc disorder at C5-C6 level: Secondary | ICD-10-CM | POA: Diagnosis not present

## 2019-10-17 DIAGNOSIS — M50923 Unspecified cervical disc disorder at C6-C7 level: Secondary | ICD-10-CM | POA: Diagnosis not present

## 2019-10-17 DIAGNOSIS — M25511 Pain in right shoulder: Secondary | ICD-10-CM | POA: Diagnosis not present

## 2019-10-17 DIAGNOSIS — M5136 Other intervertebral disc degeneration, lumbar region: Secondary | ICD-10-CM | POA: Diagnosis not present

## 2019-10-19 DIAGNOSIS — Z713 Dietary counseling and surveillance: Secondary | ICD-10-CM | POA: Diagnosis not present

## 2019-10-21 ENCOUNTER — Encounter: Payer: Self-pay | Admitting: Family Medicine

## 2019-10-21 DIAGNOSIS — E785 Hyperlipidemia, unspecified: Secondary | ICD-10-CM

## 2019-10-21 DIAGNOSIS — R7303 Prediabetes: Secondary | ICD-10-CM

## 2019-10-21 DIAGNOSIS — E1169 Type 2 diabetes mellitus with other specified complication: Secondary | ICD-10-CM

## 2019-10-23 NOTE — Telephone Encounter (Signed)
I placed future order for the labs.

## 2019-10-24 DIAGNOSIS — M25511 Pain in right shoulder: Secondary | ICD-10-CM | POA: Diagnosis not present

## 2019-10-24 DIAGNOSIS — M50923 Unspecified cervical disc disorder at C6-C7 level: Secondary | ICD-10-CM | POA: Diagnosis not present

## 2019-10-24 DIAGNOSIS — M5136 Other intervertebral disc degeneration, lumbar region: Secondary | ICD-10-CM | POA: Diagnosis not present

## 2019-10-24 DIAGNOSIS — M50922 Unspecified cervical disc disorder at C5-C6 level: Secondary | ICD-10-CM | POA: Diagnosis not present

## 2019-10-27 ENCOUNTER — Encounter: Payer: Self-pay | Admitting: Family Medicine

## 2019-10-28 DIAGNOSIS — M25571 Pain in right ankle and joints of right foot: Secondary | ICD-10-CM | POA: Diagnosis not present

## 2019-10-28 DIAGNOSIS — M7751 Other enthesopathy of right foot: Secondary | ICD-10-CM | POA: Diagnosis not present

## 2019-10-31 ENCOUNTER — Encounter: Payer: Self-pay | Admitting: Family Medicine

## 2019-10-31 DIAGNOSIS — M25511 Pain in right shoulder: Secondary | ICD-10-CM | POA: Diagnosis not present

## 2019-10-31 DIAGNOSIS — M50922 Unspecified cervical disc disorder at C5-C6 level: Secondary | ICD-10-CM | POA: Diagnosis not present

## 2019-10-31 DIAGNOSIS — M5136 Other intervertebral disc degeneration, lumbar region: Secondary | ICD-10-CM | POA: Diagnosis not present

## 2019-10-31 DIAGNOSIS — M50923 Unspecified cervical disc disorder at C6-C7 level: Secondary | ICD-10-CM | POA: Diagnosis not present

## 2019-11-01 ENCOUNTER — Encounter: Payer: Self-pay | Admitting: Family Medicine

## 2019-11-01 ENCOUNTER — Other Ambulatory Visit: Payer: Self-pay

## 2019-11-01 ENCOUNTER — Other Ambulatory Visit (INDEPENDENT_AMBULATORY_CARE_PROVIDER_SITE_OTHER): Payer: BC Managed Care – PPO

## 2019-11-01 ENCOUNTER — Ambulatory Visit (INDEPENDENT_AMBULATORY_CARE_PROVIDER_SITE_OTHER): Payer: BC Managed Care – PPO | Admitting: Family Medicine

## 2019-11-01 VITALS — BP 118/60 | HR 64 | Temp 98.0°F | Ht 74.0 in | Wt 217.2 lb

## 2019-11-01 DIAGNOSIS — E1165 Type 2 diabetes mellitus with hyperglycemia: Secondary | ICD-10-CM | POA: Diagnosis not present

## 2019-11-01 DIAGNOSIS — R7303 Prediabetes: Secondary | ICD-10-CM | POA: Diagnosis not present

## 2019-11-01 DIAGNOSIS — G3184 Mild cognitive impairment, so stated: Secondary | ICD-10-CM

## 2019-11-01 DIAGNOSIS — E785 Hyperlipidemia, unspecified: Secondary | ICD-10-CM

## 2019-11-01 DIAGNOSIS — E1169 Type 2 diabetes mellitus with other specified complication: Secondary | ICD-10-CM

## 2019-11-01 NOTE — Progress Notes (Signed)
Established Patient Office Visit  Subjective:  Patient ID: Peter Snyder, male    DOB: 02/13/1956  Age: 64 y.o. MRN: [id number]  CC:  Chief Complaint  Patient presents with  . Memory Loss    patient is concerned about bank transaction that occured today    HPI Peter Snyder presents for concern regarding bank transaction that occurred earlier today.  He states that he was riding a check to [employer] and mistakenly wrote them out of the check for what was the balance of his checking account plus the amount owed to [employer].  He states he is not typically make these, states in the past.  He is not aware of a pattern of doing this regularly nor has his wife brought up concerns to this effect.  He has had no confusion regarding orientation to date, day of week, year, president, etc.  Though he is retired he stays extremely engaged with multiple specific activities and has a very full schedule.  He has not been recently getting confused with regard to committed activities.  He stays fairly organized with activities on his phone.  He is walking regularly for exercise without any difficulty.  No recent chest pains.  No fever.  No headaches.  No focal neurologic concerns  He does have type 2 diabetes which has been managed without medications other than low-dose Metformin.  Last A1c 6.7%.  He had labs drawn this morning including lipids and A1c.  His last LDL cholesterol was 86 with goal less than 70.  He is currently on high-dose Lipitor as well as Zetia.  Cardiology apparently considering injectable therapy  Past Medical History:  Diagnosis Date  . Arthritis   . CAD (coronary artery disease) 6/14   Cardiac catheterization 6/27/ 2014 ejection fraction 35-40%, 30% proximal left circumflex, 100% tiny obtuse marginal 1 with collaterals, 50% LAD, 50% D1, 100% RCA with collaterals  . Chicken pox   . Colon polyps   . Depression   . Diabetes mellitus ([place])    Diet controlled  . GERD (gastroesophageal  reflux disease)   . Hyperlipidemia   . Hypertension   . Kidney stone     Past Surgical History:  Procedure Laterality Date  . CRYO INTERCOSTAL NERVE BLOCK     double sacrum nerve block  . TONSILLECTOMY  1963  . TRIGGER FINGER RELEASE  2019    Family History  Problem Relation Age of Onset  . Melanoma Mother        metastatic  . Arthritis Father   . Colon polyps Father   . Hyperlipidemia Maternal Grandmother   . Heart disease Maternal Grandmother   . Arthritis Paternal Grandmother   . Clotting disorder Paternal Grandmother   . Clotting disorder Paternal Grandfather   . Liver cancer Paternal Grandfather   . Colon cancer Neg Hx   . Esophageal cancer Neg Hx   . Rectal cancer Neg Hx     Social History   Socioeconomic History  . Marital status: Married    Spouse name: Not on file  . Number of children: 3  . Years of education: Not on file  . Highest education level: Not on file  Occupational History  . Occupation: retired    Comment: Retired  Tobacco Use  . Smoking status: Former Smoker    Packs/day: 0.25    Years: 28.00    Pack years: 7.00    Start date: 75    Quit date: 2000    Years since quitting:  21.6  . Smokeless tobacco: Never Used  Vaping Use  . Vaping Use: Never used  Substance and Sexual Activity  . Alcohol use: No    Alcohol/week: 0.0 standard drinks  . Drug use: No  . Sexual activity: Not on file  Other Topics Concern  . Not on file  Social History Narrative  . Not on file   Social Determinants of Health   Financial Resource Strain:   . Difficulty of Paying Living Expenses:   Food Insecurity:   . Worried About Charity fundraiser in the Last Year:   . Arboriculturist in the Last Year:   Transportation Needs:   . Film/video editor (Medical):   Marland Kitchen Lack of Transportation (Non-Medical):   Physical Activity:   . Days of Exercise per Week:   . Minutes of Exercise per Session:   Stress:   . Feeling of Stress :   Social Connections:     . Frequency of Communication with Friends and Family:   . Frequency of Social Gatherings with Friends and Family:   . Attends Religious Services:   . Active Member of Clubs or Organizations:   . Attends Archivist Meetings:   Marland Kitchen Marital Status:   Intimate Partner Violence:   . Fear of Current or Ex-Partner:   . Emotionally Abused:   Marland Kitchen Physically Abused:   . Sexually Abused:     Outpatient Medications Prior to Visit  Medication Sig Dispense Refill  . acetaminophen (TYLENOL) 500 MG tablet Take 500 mg by mouth every 6 (six) hours as needed for moderate pain.    Marland Kitchen ALPRAZolam (XANAX) 0.25 MG tablet Take 0.25 mg by mouth daily.     Marland Kitchen aspirin 81 MG tablet Take 81 mg by mouth daily.    Marland Kitchen atorvastatin (LIPITOR) 80 MG tablet TAKE 1 TABLET BY MOUTH EVERY DAY 90 tablet 1  . azelastine (ASTELIN) 0.1 % nasal spray PLACE 1 SPRAY INTO BOTH NOSTRILS 2 (TWO) TIMES DAILY AS DIRECTED 30 mL 1  . Bismuth Tribromoph-Petrolatum (XEROFORM PETROLAT PATCH 4"X4") PADS USE AS DIRECTED TWICE A DAY EXTERNALLY 30 DAYS  4  . carvedilol (COREG) 12.5 MG tablet TAKE 1 TABLET (12.5 MG TOTAL) BY MOUTH 2 (TWO) TIMES DAILY WITH A MEAL. 180 tablet 3  . celecoxib (CELEBREX) 200 MG capsule Take 200 mg by mouth daily.     . clidinium-chlordiazePOXIDE (LIBRAX) 5-2.5 MG capsule Take 1 capsule by mouth 3 (three) times daily before meals. 60 capsule 0  . clonazePAM (KLONOPIN) 0.5 MG tablet Take 0.5 mg by mouth at bedtime.     . Dermatological Products, Misc. (LEVICYN) GEL Apply 1 application topically 2 (two) times daily after a meal.  2  . Dermatological Products, Misc. (TETRIX) CREA Apply topically See admin instructions.  3  . diclofenac Sodium (VOLTAREN) 1 % GEL diclofenac 1 % topical gel    . ELIDEL 1 % cream APPLY TO AFFECTED AREA TWICE A DAY AS NEEDED FOR 14 DAYS  2  . escitalopram (LEXAPRO) 20 MG tablet Take 20 mg by mouth daily.    Marland Kitchen ezetimibe (ZETIA) 10 MG tablet TAKE 1 TABLET BY MOUTH EVERY DAY 90 tablet 1  .  fluticasone (FLONASE) 50 MCG/ACT nasal spray USE 2 SPRAYS IN EACH NOSTRIL EVERY DAY 48 mL 2  . gabapentin (NEURONTIN) 600 MG tablet TAKE 1 TABLET BY MOUTH TWICE A DAY 180 tablet 1  . lisinopril (ZESTRIL) 20 MG tablet TAKE 1 TABLET (20 MG TOTAL) BY  MOUTH DAILY. **PATIENT NEEDS OV FOR FUTURE REFILLS** 90 tablet 0  . LOTEMAX 0.5 % GEL SMARTSIG:1 Drop(s) In Eye(s) Twice Daily PRN    . metFORMIN (GLUCOPHAGE) 500 MG tablet Take 1 tablet (500 mg total) by mouth 2 (two) times daily with a meal. 180 tablet 3  . mupirocin ointment (BACTROBAN) 2 % Place 1 application into the nose 2 (two) times daily. Apply intranasally bid for 5 days. 22 g 1  . pantoprazole (PROTONIX) 40 MG tablet TAKE 1 TABLET BY MOUTH TWO TIMES DAILY. 180 tablet 3  . RESTASIS 0.05 % ophthalmic emulsion Place 1 drop into both eyes 2 (two) times daily.     . tacrolimus (PROTOPIC) 0.1 % ointment APPLY TO AFFECTED AREA ONCE A DAY    . tamsulosin (FLOMAX) 0.4 MG CAPS capsule TAKE 1 CAPSULE (0.4 MG TOTAL) BY MOUTH DAILY AFTER SUPPER. 90 capsule 1  . triamcinolone cream (KENALOG) 0.1 % APPLY TOPICALLY TO THE AFFECTED AREA 2 (TWO) TIMES DAILY AS NEEDED. 80 g 1  . urea (CARMOL) 10 % cream APPLY TO AFFECTED AREA TWICE A DAY AS NEEDED  3  . urea (CARMOL) 40 % CREA APPLY TO AFFECTED AREA AS NEEDED EVERY DAY    . Urea 40 % LOTN Apply 1 application topically 2 (two) times a week.  3  . zolpidem (AMBIEN) 10 MG tablet Take 10 mg by mouth at bedtime as needed for sleep.     No facility-administered medications prior to visit.    Allergies  Allergen Reactions  . Latex Other (See Comments)    Other  . Sulfa Antibiotics Rash    ROS Review of Systems  Constitutional: Negative for chills, fatigue, fever and unexpected weight change.  Respiratory: Negative for shortness of breath.   Cardiovascular: Negative for chest pain.  Neurological: Negative for dizziness, seizures, syncope, speech difficulty, weakness and headaches.  Psychiatric/Behavioral:  Negative for agitation and confusion.      Objective:    Physical Exam Vitals reviewed.  Constitutional:      Appearance: Normal appearance.  Cardiovascular:     Rate and Rhythm: Normal rate and regular rhythm.  Pulmonary:     Effort: Pulmonary effort is normal.     Breath sounds: Normal breath sounds.  Musculoskeletal:     Right lower leg: No edema.     Left lower leg: No edema.  Neurological:     General: No focal deficit present.     Mental Status: He is alert and oriented to person, place, and time.     Cranial Nerves: No cranial nerve deficit.     Motor: No weakness.     Coordination: Coordination normal.     Gait: Gait normal.  Psychiatric:        Mood and Affect: Mood normal.        Thought Content: Thought content normal.     Comments: Oriented to time, date, year, month, day of week No difficulty with serial 7 subtractions 2 out of 3 short-term word recall     BP 118/60 (BP Location: Left Arm, Patient Position: Sitting, Cuff Size: Normal)   Pulse 64   Temp 98 F (36.7 C) (Oral)   Ht 6\' 2"  (1.88 m)   Wt 217 lb 3.2 oz (98.5 kg)   BMI 27.89 kg/m  Wt Readings from Last 3 Encounters:  11/01/19 217 lb 3.2 oz (98.5 kg)  08/02/19 217 lb (98.4 kg)  07/22/19 219 lb (99.3 kg)     Health Maintenance Due  Topic Date Due  . FOOT EXAM  Never done  . INFLUENZA VACCINE  10/16/2019    There are no preventive care reminders to display for this patient.  Lab Results  Component Value Date   TSH 1.40 03/15/2019   Lab Results  Component Value Date   WBC 8.0 03/15/2019   HGB 13.1 03/15/2019   HCT 39.8 03/15/2019   MCV 94.3 03/15/2019   PLT 141.0 (L) 03/15/2019   Lab Results  Component Value Date   NA 137 03/15/2019   K 4.4 03/15/2019   CO2 29 03/15/2019   GLUCOSE 134 (H) 03/15/2019   BUN 14 03/15/2019   CREATININE 1.02 03/15/2019   BILITOT 0.5 03/15/2019   ALKPHOS 58 03/15/2019   AST 17 03/15/2019   ALT 16 03/15/2019   PROT 6.7 03/15/2019   ALBUMIN  4.4 03/15/2019   CALCIUM 9.4 03/15/2019   ANIONGAP 7 04/03/2014   GFR 73.57 03/15/2019   Lab Results  Component Value Date   CHOL 160 03/15/2019   Lab Results  Component Value Date   HDL 51.70 03/15/2019   Lab Results  Component Value Date   LDLCALC 86 03/15/2019   Lab Results  Component Value Date   TRIG 109.0 03/15/2019   Lab Results  Component Value Date   CHOLHDL 3 03/15/2019   Lab Results  Component Value Date   HGBA1C 6.7 (H) 07/11/2019      Assessment & Plan:   #1 concern expressed by patient for possible mild cognitive impairment.  He had 1 faulty transaction with his checking account this morning but has made no other mistakes to his knowledge recently.  We do not see any major concerns from brief mental status assessment.  He did have only 2 out of 3 short-term recall.  Recent thyroid function is normal.  -We discussed checking things like B12 levels but he had blood work already drawn this morning. -We recommend observation for now but if he has ongoing concerns consider B12 level and consider further neuropsychological testing  #2 type 2 diabetes.  History of good control.  He has had some recent steroid injections per orthopedics which may have some negligible effect on his A1c-which was drawn earlier today  No orders of the defined types were placed in this encounter.   Follow-up: No follow-ups on file.    Carolann Littler, MD

## 2019-11-02 ENCOUNTER — Encounter: Payer: Self-pay | Admitting: Family Medicine

## 2019-11-02 DIAGNOSIS — F429 Obsessive-compulsive disorder, unspecified: Secondary | ICD-10-CM | POA: Diagnosis not present

## 2019-11-02 DIAGNOSIS — F901 Attention-deficit hyperactivity disorder, predominantly hyperactive type: Secondary | ICD-10-CM | POA: Diagnosis not present

## 2019-11-02 LAB — HEPATIC FUNCTION PANEL
AG Ratio: 1.9 (calc) (ref 1.0–2.5)
ALT: 16 U/L (ref 9–46)
AST: 18 U/L (ref 10–35)
Albumin: 4.4 g/dL (ref 3.6–5.1)
Alkaline phosphatase (APISO): 53 U/L (ref 35–144)
Bilirubin, Direct: 0.1 mg/dL (ref 0.0–0.2)
Globulin: 2.3 g/dL (calc) (ref 1.9–3.7)
Indirect Bilirubin: 0.5 mg/dL (calc) (ref 0.2–1.2)
Total Bilirubin: 0.6 mg/dL (ref 0.2–1.2)
Total Protein: 6.7 g/dL (ref 6.1–8.1)

## 2019-11-02 LAB — LIPID PANEL
Cholesterol: 143 mg/dL (ref <200)
HDL: 44 mg/dL (ref >=40)
LDL Cholesterol (Calc): 77 mg/dL (calc)
Non-HDL Cholesterol (Calc): 99 mg/dL (calc) (ref <130)
Total CHOL/HDL Ratio: 3.3 (calc) (ref <5.0)
Triglycerides: 136 mg/dL (ref <150)

## 2019-11-02 LAB — BASIC METABOLIC PANEL
BUN: 15 mg/dL (ref 7–25)
CO2: 33 mmol/L — ABNORMAL HIGH (ref 20–32)
Calcium: 9.7 mg/dL (ref 8.6–10.3)
Chloride: 102 mmol/L (ref 98–110)
Creat: 1.05 mg/dL (ref 0.70–1.25)
Glucose, Bld: 114 mg/dL — ABNORMAL HIGH (ref 65–99)
Potassium: 4.8 mmol/L (ref 3.5–5.3)
Sodium: 139 mmol/L (ref 135–146)

## 2019-11-02 LAB — HEMOGLOBIN A1C
Hgb A1c MFr Bld: 6.2 % of total Hgb — ABNORMAL HIGH (ref <5.7)
Mean Plasma Glucose: 131 (calc)
eAG (mmol/L): 7.3 (calc)

## 2019-11-06 ENCOUNTER — Other Ambulatory Visit: Payer: Self-pay | Admitting: Family Medicine

## 2019-11-07 DIAGNOSIS — M5136 Other intervertebral disc degeneration, lumbar region: Secondary | ICD-10-CM | POA: Diagnosis not present

## 2019-11-07 DIAGNOSIS — M50923 Unspecified cervical disc disorder at C6-C7 level: Secondary | ICD-10-CM | POA: Diagnosis not present

## 2019-11-07 DIAGNOSIS — M25511 Pain in right shoulder: Secondary | ICD-10-CM | POA: Diagnosis not present

## 2019-11-07 DIAGNOSIS — M50922 Unspecified cervical disc disorder at C5-C6 level: Secondary | ICD-10-CM | POA: Diagnosis not present

## 2019-11-15 DIAGNOSIS — M5136 Other intervertebral disc degeneration, lumbar region: Secondary | ICD-10-CM | POA: Diagnosis not present

## 2019-11-15 DIAGNOSIS — M50923 Unspecified cervical disc disorder at C6-C7 level: Secondary | ICD-10-CM | POA: Diagnosis not present

## 2019-11-15 DIAGNOSIS — M50922 Unspecified cervical disc disorder at C5-C6 level: Secondary | ICD-10-CM | POA: Diagnosis not present

## 2019-11-15 DIAGNOSIS — M25511 Pain in right shoulder: Secondary | ICD-10-CM | POA: Diagnosis not present

## 2019-11-15 DIAGNOSIS — Z713 Dietary counseling and surveillance: Secondary | ICD-10-CM | POA: Diagnosis not present

## 2019-11-16 DIAGNOSIS — F901 Attention-deficit hyperactivity disorder, predominantly hyperactive type: Secondary | ICD-10-CM | POA: Diagnosis not present

## 2019-11-16 DIAGNOSIS — F429 Obsessive-compulsive disorder, unspecified: Secondary | ICD-10-CM | POA: Diagnosis not present

## 2019-11-17 ENCOUNTER — Encounter: Payer: Self-pay | Admitting: *Deleted

## 2019-11-17 DIAGNOSIS — G5762 Lesion of plantar nerve, left lower limb: Secondary | ICD-10-CM | POA: Diagnosis not present

## 2019-11-17 DIAGNOSIS — L570 Actinic keratosis: Secondary | ICD-10-CM | POA: Diagnosis not present

## 2019-11-17 DIAGNOSIS — L309 Dermatitis, unspecified: Secondary | ICD-10-CM | POA: Diagnosis not present

## 2019-11-17 DIAGNOSIS — M7752 Other enthesopathy of left foot: Secondary | ICD-10-CM | POA: Diagnosis not present

## 2019-11-22 ENCOUNTER — Telehealth: Payer: Self-pay | Admitting: Family Medicine

## 2019-11-22 DIAGNOSIS — M25511 Pain in right shoulder: Secondary | ICD-10-CM | POA: Diagnosis not present

## 2019-11-22 DIAGNOSIS — M50922 Unspecified cervical disc disorder at C5-C6 level: Secondary | ICD-10-CM | POA: Diagnosis not present

## 2019-11-22 DIAGNOSIS — M50923 Unspecified cervical disc disorder at C6-C7 level: Secondary | ICD-10-CM | POA: Diagnosis not present

## 2019-11-22 DIAGNOSIS — M5136 Other intervertebral disc degeneration, lumbar region: Secondary | ICD-10-CM | POA: Diagnosis not present

## 2019-11-22 NOTE — Telephone Encounter (Signed)
Lyndee Leo called to say she Lost contact with pt and is no longer in the case management program with [employer]  He can call at any time to restart  Just an FYI for the Dr.

## 2019-11-23 NOTE — Telephone Encounter (Signed)
Noted  

## 2019-11-28 DIAGNOSIS — M5136 Other intervertebral disc degeneration, lumbar region: Secondary | ICD-10-CM | POA: Diagnosis not present

## 2019-11-28 DIAGNOSIS — M25511 Pain in right shoulder: Secondary | ICD-10-CM | POA: Diagnosis not present

## 2019-11-28 DIAGNOSIS — M50922 Unspecified cervical disc disorder at C5-C6 level: Secondary | ICD-10-CM | POA: Diagnosis not present

## 2019-11-28 DIAGNOSIS — M50923 Unspecified cervical disc disorder at C6-C7 level: Secondary | ICD-10-CM | POA: Diagnosis not present

## 2019-11-30 ENCOUNTER — Other Ambulatory Visit: Payer: Self-pay | Admitting: Family Medicine

## 2019-11-30 DIAGNOSIS — F429 Obsessive-compulsive disorder, unspecified: Secondary | ICD-10-CM | POA: Diagnosis not present

## 2019-11-30 DIAGNOSIS — F901 Attention-deficit hyperactivity disorder, predominantly hyperactive type: Secondary | ICD-10-CM | POA: Diagnosis not present

## 2019-12-01 DIAGNOSIS — G5762 Lesion of plantar nerve, left lower limb: Secondary | ICD-10-CM | POA: Diagnosis not present

## 2019-12-01 DIAGNOSIS — M25572 Pain in left ankle and joints of left foot: Secondary | ICD-10-CM | POA: Diagnosis not present

## 2019-12-05 ENCOUNTER — Other Ambulatory Visit: Payer: Self-pay | Admitting: Family Medicine

## 2019-12-12 DIAGNOSIS — M5136 Other intervertebral disc degeneration, lumbar region: Secondary | ICD-10-CM | POA: Diagnosis not present

## 2019-12-12 DIAGNOSIS — M50922 Unspecified cervical disc disorder at C5-C6 level: Secondary | ICD-10-CM | POA: Diagnosis not present

## 2019-12-12 DIAGNOSIS — M25511 Pain in right shoulder: Secondary | ICD-10-CM | POA: Diagnosis not present

## 2019-12-12 DIAGNOSIS — M50923 Unspecified cervical disc disorder at C6-C7 level: Secondary | ICD-10-CM | POA: Diagnosis not present

## 2019-12-14 DIAGNOSIS — L700 Acne vulgaris: Secondary | ICD-10-CM | POA: Diagnosis not present

## 2019-12-14 DIAGNOSIS — L309 Dermatitis, unspecified: Secondary | ICD-10-CM | POA: Diagnosis not present

## 2019-12-14 DIAGNOSIS — L570 Actinic keratosis: Secondary | ICD-10-CM | POA: Diagnosis not present

## 2019-12-15 ENCOUNTER — Encounter: Payer: Self-pay | Admitting: Gastroenterology

## 2019-12-16 DIAGNOSIS — M7752 Other enthesopathy of left foot: Secondary | ICD-10-CM | POA: Diagnosis not present

## 2019-12-16 DIAGNOSIS — G5762 Lesion of plantar nerve, left lower limb: Secondary | ICD-10-CM | POA: Diagnosis not present

## 2019-12-19 DIAGNOSIS — M5136 Other intervertebral disc degeneration, lumbar region: Secondary | ICD-10-CM | POA: Diagnosis not present

## 2019-12-19 DIAGNOSIS — M25511 Pain in right shoulder: Secondary | ICD-10-CM | POA: Diagnosis not present

## 2019-12-19 DIAGNOSIS — M50922 Unspecified cervical disc disorder at C5-C6 level: Secondary | ICD-10-CM | POA: Diagnosis not present

## 2019-12-19 DIAGNOSIS — M50923 Unspecified cervical disc disorder at C6-C7 level: Secondary | ICD-10-CM | POA: Diagnosis not present

## 2019-12-21 DIAGNOSIS — M25511 Pain in right shoulder: Secondary | ICD-10-CM | POA: Diagnosis not present

## 2019-12-21 DIAGNOSIS — M50923 Unspecified cervical disc disorder at C6-C7 level: Secondary | ICD-10-CM | POA: Diagnosis not present

## 2019-12-21 DIAGNOSIS — M50922 Unspecified cervical disc disorder at C5-C6 level: Secondary | ICD-10-CM | POA: Diagnosis not present

## 2019-12-21 DIAGNOSIS — M5136 Other intervertebral disc degeneration, lumbar region: Secondary | ICD-10-CM | POA: Diagnosis not present

## 2019-12-21 DIAGNOSIS — L600 Ingrowing nail: Secondary | ICD-10-CM | POA: Diagnosis not present

## 2019-12-22 DIAGNOSIS — M5136 Other intervertebral disc degeneration, lumbar region: Secondary | ICD-10-CM | POA: Diagnosis not present

## 2019-12-22 DIAGNOSIS — M5030 Other cervical disc degeneration, unspecified cervical region: Secondary | ICD-10-CM | POA: Diagnosis not present

## 2019-12-22 DIAGNOSIS — M5416 Radiculopathy, lumbar region: Secondary | ICD-10-CM | POA: Diagnosis not present

## 2019-12-22 DIAGNOSIS — M542 Cervicalgia: Secondary | ICD-10-CM | POA: Diagnosis not present

## 2019-12-25 ENCOUNTER — Other Ambulatory Visit: Payer: Self-pay | Admitting: Family Medicine

## 2019-12-28 DIAGNOSIS — F429 Obsessive-compulsive disorder, unspecified: Secondary | ICD-10-CM | POA: Diagnosis not present

## 2019-12-28 DIAGNOSIS — F901 Attention-deficit hyperactivity disorder, predominantly hyperactive type: Secondary | ICD-10-CM | POA: Diagnosis not present

## 2020-01-02 DIAGNOSIS — M5136 Other intervertebral disc degeneration, lumbar region: Secondary | ICD-10-CM | POA: Diagnosis not present

## 2020-01-02 DIAGNOSIS — M50922 Unspecified cervical disc disorder at C5-C6 level: Secondary | ICD-10-CM | POA: Diagnosis not present

## 2020-01-02 DIAGNOSIS — M25511 Pain in right shoulder: Secondary | ICD-10-CM | POA: Diagnosis not present

## 2020-01-02 DIAGNOSIS — M50923 Unspecified cervical disc disorder at C6-C7 level: Secondary | ICD-10-CM | POA: Diagnosis not present

## 2020-01-08 ENCOUNTER — Other Ambulatory Visit: Payer: Self-pay | Admitting: Family Medicine

## 2020-01-08 ENCOUNTER — Other Ambulatory Visit: Payer: Self-pay | Admitting: Cardiology

## 2020-01-09 DIAGNOSIS — M5136 Other intervertebral disc degeneration, lumbar region: Secondary | ICD-10-CM | POA: Diagnosis not present

## 2020-01-09 DIAGNOSIS — M25511 Pain in right shoulder: Secondary | ICD-10-CM | POA: Diagnosis not present

## 2020-01-09 DIAGNOSIS — L718 Other rosacea: Secondary | ICD-10-CM | POA: Diagnosis not present

## 2020-01-09 DIAGNOSIS — M50922 Unspecified cervical disc disorder at C5-C6 level: Secondary | ICD-10-CM | POA: Diagnosis not present

## 2020-01-09 DIAGNOSIS — M50923 Unspecified cervical disc disorder at C6-C7 level: Secondary | ICD-10-CM | POA: Diagnosis not present

## 2020-01-09 MED ORDER — LISINOPRIL 20 MG PO TABS
20.0000 mg | ORAL_TABLET | Freq: Every day | ORAL | 3 refills | Status: DC
Start: 2020-01-09 — End: 2020-08-20

## 2020-01-11 DIAGNOSIS — F429 Obsessive-compulsive disorder, unspecified: Secondary | ICD-10-CM | POA: Diagnosis not present

## 2020-01-11 DIAGNOSIS — M7752 Other enthesopathy of left foot: Secondary | ICD-10-CM | POA: Diagnosis not present

## 2020-01-11 DIAGNOSIS — F901 Attention-deficit hyperactivity disorder, predominantly hyperactive type: Secondary | ICD-10-CM | POA: Diagnosis not present

## 2020-01-11 DIAGNOSIS — G5762 Lesion of plantar nerve, left lower limb: Secondary | ICD-10-CM | POA: Diagnosis not present

## 2020-01-11 DIAGNOSIS — M545 Low back pain, unspecified: Secondary | ICD-10-CM | POA: Diagnosis not present

## 2020-01-12 DIAGNOSIS — Z713 Dietary counseling and surveillance: Secondary | ICD-10-CM | POA: Diagnosis not present

## 2020-01-16 DIAGNOSIS — M50923 Unspecified cervical disc disorder at C6-C7 level: Secondary | ICD-10-CM | POA: Diagnosis not present

## 2020-01-16 DIAGNOSIS — M25511 Pain in right shoulder: Secondary | ICD-10-CM | POA: Diagnosis not present

## 2020-01-16 DIAGNOSIS — M5136 Other intervertebral disc degeneration, lumbar region: Secondary | ICD-10-CM | POA: Diagnosis not present

## 2020-01-16 DIAGNOSIS — M50922 Unspecified cervical disc disorder at C5-C6 level: Secondary | ICD-10-CM | POA: Diagnosis not present

## 2020-01-17 DIAGNOSIS — M25562 Pain in left knee: Secondary | ICD-10-CM | POA: Diagnosis not present

## 2020-01-20 DIAGNOSIS — M5137 Other intervertebral disc degeneration, lumbosacral region: Secondary | ICD-10-CM | POA: Diagnosis not present

## 2020-01-23 DIAGNOSIS — M50922 Unspecified cervical disc disorder at C5-C6 level: Secondary | ICD-10-CM | POA: Diagnosis not present

## 2020-01-23 DIAGNOSIS — M5136 Other intervertebral disc degeneration, lumbar region: Secondary | ICD-10-CM | POA: Diagnosis not present

## 2020-01-23 DIAGNOSIS — M50923 Unspecified cervical disc disorder at C6-C7 level: Secondary | ICD-10-CM | POA: Diagnosis not present

## 2020-01-23 DIAGNOSIS — M25511 Pain in right shoulder: Secondary | ICD-10-CM | POA: Diagnosis not present

## 2020-01-24 IMAGING — MR MR MRA CHEST W/ OR W/O CM
12 series · 16 of 16 positions shown · IV contrast (Multihance 19ml)
Comparison: 06/01/2016

CLINICAL DATA: Follow-up aortic root dilatation

EXAM:
MRA CHEST WITH CONTRAST
TECHNIQUE: Multiplanar, multiecho pulse sequences of the chest were obtained
with intravenous contrast. Angiographic images of chest were
obtained using MRA technique with intravenous contrast.
CONTRAST:  19mL MULTIHANCE GADOBENATE DIMEGLUMINE 529 MG/ML IV SOLN
Creatinine was obtained on site at [HOSPITAL] at [HOSPITAL].
Results: Creatinine 1.2 mg/dL.

[Series 2: bSSFP · axial · 6.0mm · 1.48mm/px · 1 of 33 slices shown (1 of 2)]
[im 1/33]
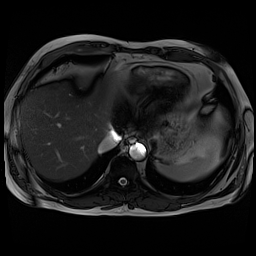

[Series 3: axial haste · axial · 5.0mm · 0.74mm/px · 1 of 37 slices shown]
[im 1/37]
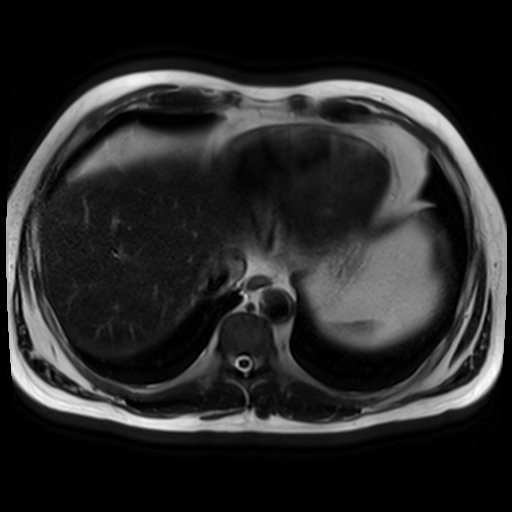

[Series 4: T1 fat-sat · axial · 5.0mm · 1.48mm/px · 1 of 37 slices shown]
[im 1/37]
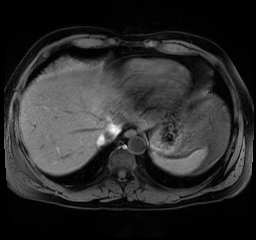

[Series 5: T1 dynamic · axial · non-contrast · 2.5mm · 1.56mm/px · 1 of 80 slices shown]
[im 1/80]
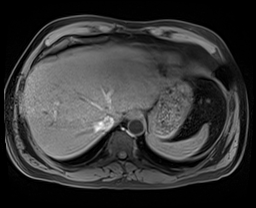

[Series 6: bSSFP · sagittal · 4.0mm · 1.56mm/px · 1 of 31 slices shown (2 of 2)]
[im 1/31]
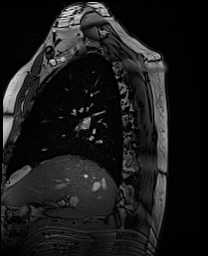

[Series 9: cor flash pre · sagittal · non-contrast · 1.5mm · 1.04mm/px · 1 of 88 slices shown]
[im 1/88]
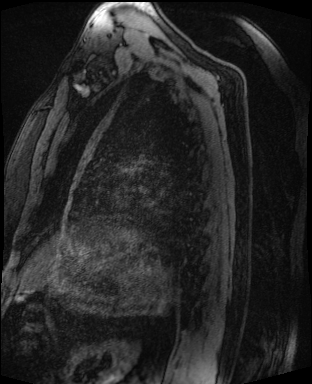

[Series 11: cor flash post · sagittal · 1.5mm · 1.04mm/px · 2 of 88 slices shown]
[im 1/88]
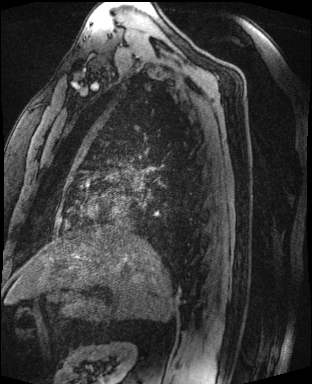
[im 88/88]
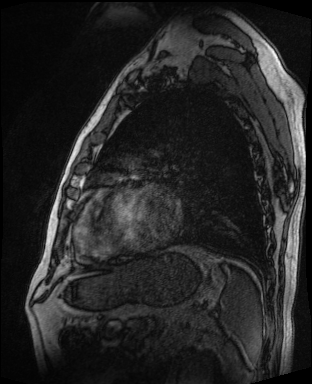

[Series 12: cor flash post_sub · sagittal · 1.5mm · 1.04mm/px · 2 of 88 slices shown]
[im 1/88]
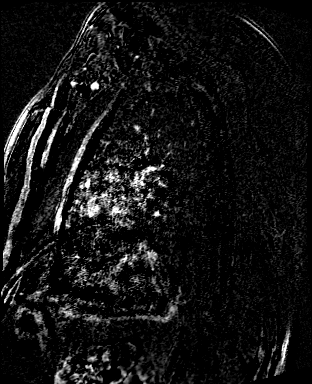
[im 88/88]
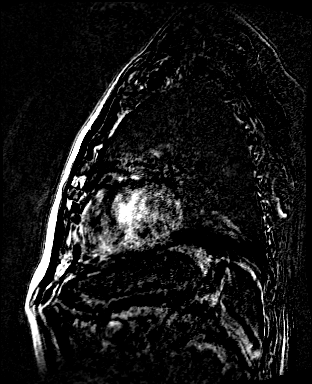

[Series 14: T1 dynamic post-contrast · axial · 2.5mm · 1.56mm/px · z∈[-121,+76]mm · 2 of 80 slices shown (1 of 2)]
[im 1/80]
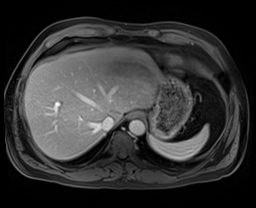
[im 80/80]
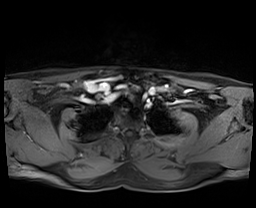

[Series 15: T1 dynamic post-contrast · axial · 2.5mm · 1.56mm/px · z∈[-121,+76]mm · 2 of 80 slices shown (2 of 2)]
[im 1/80]
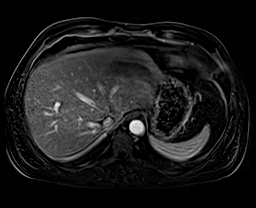
[im 80/80]
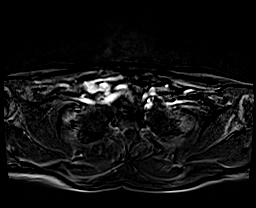

[Series 16: T1 fat-sat post-contrast · axial · 5.0mm · 1.48mm/px · 1 of 37 slices shown]
[im 1/37]
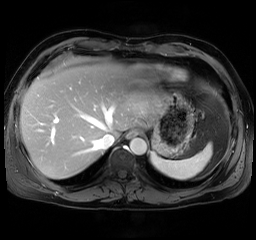

[Series 100: MRA · sagittal · 1.04mm/px · 1 of 15 slices shown]
[im 1/15]
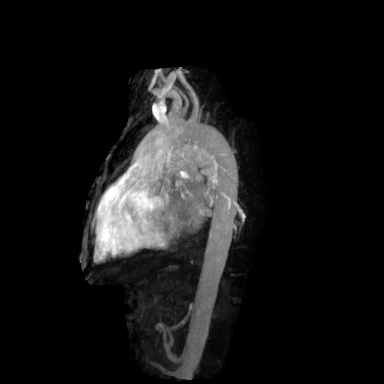

[16 of 16 positions shown; findings below may reference images not displayed]

FINDINGS: Maximal diameter of the ascending aorta at the sinus of Valsalva,
Kimiko junction, and ascending aorta a 3.9 cm, 3.6 cm, and
cm respectively. These findings are stable. Great vessels are
patent. Right vertebral artery is dominant. There is suspected
narrowing at the origin of the right vertebral artery. Left
vertebral artery is patent within the confines of the examination.
There is no evidence of aortic dissection or intramural hematoma.

No obvious mediastinal mass effect.  Lungs are grossly clear.
IMPRESSION: Stable dilatation of the ascending aorta and sinus of Valsalva at
3.9 cm and 3.9 cm respectively.

## 2020-01-25 ENCOUNTER — Encounter: Payer: Self-pay | Admitting: Family Medicine

## 2020-01-25 DIAGNOSIS — F429 Obsessive-compulsive disorder, unspecified: Secondary | ICD-10-CM | POA: Diagnosis not present

## 2020-01-25 DIAGNOSIS — F901 Attention-deficit hyperactivity disorder, predominantly hyperactive type: Secondary | ICD-10-CM | POA: Diagnosis not present

## 2020-01-30 DIAGNOSIS — L600 Ingrowing nail: Secondary | ICD-10-CM | POA: Diagnosis not present

## 2020-01-30 DIAGNOSIS — M50922 Unspecified cervical disc disorder at C5-C6 level: Secondary | ICD-10-CM | POA: Diagnosis not present

## 2020-01-30 DIAGNOSIS — M5136 Other intervertebral disc degeneration, lumbar region: Secondary | ICD-10-CM | POA: Diagnosis not present

## 2020-01-30 DIAGNOSIS — M50923 Unspecified cervical disc disorder at C6-C7 level: Secondary | ICD-10-CM | POA: Diagnosis not present

## 2020-01-30 DIAGNOSIS — M25511 Pain in right shoulder: Secondary | ICD-10-CM | POA: Diagnosis not present

## 2020-01-30 DIAGNOSIS — G5761 Lesion of plantar nerve, right lower limb: Secondary | ICD-10-CM | POA: Diagnosis not present

## 2020-02-01 ENCOUNTER — Ambulatory Visit (AMBULATORY_SURGERY_CENTER): Payer: Self-pay | Admitting: *Deleted

## 2020-02-01 ENCOUNTER — Other Ambulatory Visit: Payer: Self-pay

## 2020-02-01 VITALS — Ht 74.0 in | Wt 217.0 lb

## 2020-02-01 DIAGNOSIS — Z86010 Personal history of colonic polyps: Secondary | ICD-10-CM

## 2020-02-01 MED ORDER — SUPREP BOWEL PREP KIT 17.5-3.13-1.6 GM/177ML PO SOLN: 1.0000 | Freq: Once | ORAL | 0 refills | Status: AC

## 2020-02-01 NOTE — Progress Notes (Signed)
Fully vx'd for covid  No egg or soy allergy known to patient  No issues with past sedation with any surgeries or procedures no intubation problems in the past  No FH of Malignant Hyperthermia No diet pills per patient No home 02 use per patient  No blood thinners per patient  Pt denies issues with constipation  No A fib or A flutter  EMMI video to pt or via [place] 19 guidelines implemented in [facility] today with Pt and RN   Suprep  Coupon given to pt in [facility] today , Code to Pharmacy   Due to the COVID-19 pandemic we are asking patients to follow these guidelines. Please only bring one care partner. Please be aware that your care partner may wait in the car in the parking lot or if they feel like they will be too hot to wait in the car, they may wait in the lobby on the 4th floor. All care partners are required to wear a mask the entire time (we do not have any that we can provide them), they need to practice social distancing, and we will do a Covid check for all patient's and care partners when you arrive. Also we will check their temperature and your temperature. If the care partner waits in their car they need to stay in the parking lot the entire time and we will call them on their cell phone when the patient is ready for discharge so they can bring the car to the front of the building. Also all patient's will need to wear a mask into building.

## 2020-02-02 ENCOUNTER — Encounter: Payer: Self-pay | Admitting: Gastroenterology

## 2020-02-02 ENCOUNTER — Other Ambulatory Visit: Payer: Self-pay

## 2020-02-03 ENCOUNTER — Ambulatory Visit (INDEPENDENT_AMBULATORY_CARE_PROVIDER_SITE_OTHER): Payer: BC Managed Care – PPO | Admitting: Family Medicine

## 2020-02-03 ENCOUNTER — Encounter: Payer: Self-pay | Admitting: Family Medicine

## 2020-02-03 ENCOUNTER — Other Ambulatory Visit: Payer: Self-pay | Admitting: Family Medicine

## 2020-02-03 VITALS — BP 121/63 | HR 59 | Ht 74.0 in | Wt 220.0 lb

## 2020-02-03 DIAGNOSIS — K602 Anal fissure, unspecified: Secondary | ICD-10-CM | POA: Diagnosis not present

## 2020-02-03 DIAGNOSIS — H938X2 Other specified disorders of left ear: Secondary | ICD-10-CM | POA: Diagnosis not present

## 2020-02-03 NOTE — Patient Instructions (Signed)
Anal Fissure, Adult  An anal fissure is a small tear or crack in the tissue of the anus. Bleeding from a fissure usually stops on its own within a few minutes. However, bleeding will often occur again with each bowel movement until the fissure heals. What are the causes? This condition is usually caused by passing a large or hard stool (feces). Other causes include:  Constipation.  Frequent diarrhea.  Inflammatory bowel disease (Crohn's disease or ulcerative colitis).  Childbirth.  Infections.  Anal sex. What are the signs or symptoms? Symptoms of this condition include:  Bleeding from the rectum.  Small amounts of blood seen on your stool, on the toilet paper, or in the toilet after a bowel movement. The blood coats the outside of the stool and is not mixed with the stool.  Painful bowel movements.  Itching or irritation around the anus. How is this diagnosed? A health care provider may diagnose this condition by closely examining the anal area. An anal fissure can usually be seen with careful inspection. In some cases, a rectal exam may be performed, or a short tube (anoscope) may be used to examine the anal canal. How is this treated? Initial treatment for this condition may include:  Taking steps to avoid constipation. This may include making changes to your diet, such as increasing your intake of fiber or fluid.  Taking fiber supplements. These supplements can soften your stool to help make bowel movements easier. Your health care provider may also prescribe a stool softener if your stool is hard.  Taking sitz baths. This may help to heal the tear.  Using medicated creams or ointments. These may be prescribed to lessen discomfort. Treatments that are sometimes used if initial treatments do not work well or if the condition is more severe may include:  Botulinum injection.  Surgery to repair the fissure. Follow these instructions at home: Eating and  drinking   Avoid foods that may cause constipation, such as bananas, milk, and other dairy products.  Eat all fruits, except bananas.  Drink enough fluid to keep your urine pale yellow.  Eat foods that are high in fiber, such as beans, whole grains, and fresh fruits and vegetables. General instructions   Take over-the-counter and prescription medicines only as told by your health care provider.  Use creams or ointments only as told by your health care provider.  Keep the anal area clean and dry.  Take sitz baths as told by your health care provider. Do not use soap in the sitz baths.  Keep all follow-up visits as told by your health care provider. This is important. Contact a health care provider if you have:  More bleeding.  A fever.  Diarrhea that is mixed with blood.  Pain that continues.  Ongoing problems that are getting worse rather than better. Summary  An anal fissure is a small tear or crack in the tissue of the anus. This condition is usually caused by passing a large or hard stool (feces). Other causes include constipation and frequent diarrhea.  Initial treatment for this condition may include taking steps to avoid constipation, such as increasing your intake of fiber or fluid.  Follow instructions for care as told by your health care provider.  Contact your health care provider if you have more bleeding or your problem is getting worse rather than better.  Keep all follow-up visits as told by your health care provider. This is important. This information is not intended to replace advice given   to you by your health care provider. Make sure you discuss any questions you have with your health care provider. Document Revised: 08/13/2017 Document Reviewed: 08/13/2017 Elsevier Patient Education  2020 [employer]  

## 2020-02-03 NOTE — Progress Notes (Signed)
Established Patient Office Visit  Subjective:  Patient ID: Peter Snyder, male    DOB: [birth year]  Age: 64 y.o. MRN: [id number]  CC:  Chief Complaint  Patient presents with  . Ear Pain  . Hemorrhoids    HPI [name] presents for left ear symptoms and possible "hemorrhoids ".  He states his left ear is not really painful but has recently over the past few days felt "wet "internally.  He has not noted any dizziness.  No drainage.  No nasal congestion.  He also has had hemorrhoids in the past.  He has recently had some mild pain and irritation in the anal region.  No straining with stools.  No bloody stools.  No appetite or weight changes.  Last colonoscopy was 10/18  Past Medical History:  Diagnosis Date  . Allergy   . Arthritis   . CAD (coronary artery disease) 6/14   Cardiac catheterization 6/27/ 2014 ejection fraction 35-40%, 30% proximal left circumflex, 100% tiny obtuse marginal 1 with collaterals, 50% LAD, 50% D1, 100% RCA with collaterals  . Chicken pox   . Chronic back pain   . Colon polyps   . Depression   . Diabetes mellitus ([place])    Diet controlled- OFF metformin 02-01-20  . GERD (gastroesophageal reflux disease)   . Hyperlipidemia   . Hypertension   . Kidney stone   . Myocardial infarction [facility])    ? year- cath 2014/ june     Past Surgical History:  Procedure Laterality Date  . COLONOSCOPY    . CRYO INTERCOSTAL NERVE BLOCK     double sacrum nerve block  . epidural injections     multiple- L5 S1 and cervical neck pain issues as well   . POLYPECTOMY    . TONSILLECTOMY  1963  . TRIGGER FINGER RELEASE  2019    Family History  Problem Relation Age of Onset  . Melanoma Mother        metastatic  . Arthritis Father   . Colon polyps Father   . Hyperlipidemia Maternal Grandmother   . Heart disease Maternal Grandmother   . Arthritis Paternal Grandmother   . Clotting disorder Paternal Grandmother   . Clotting disorder Paternal Grandfather   . Liver  cancer Paternal Grandfather   . Colon cancer Neg Hx   . Esophageal cancer Neg Hx   . Rectal cancer Neg Hx   . Stomach cancer Neg Hx     Social History   Socioeconomic History  . Marital status: Married    Spouse name: Not on file  . Number of children: 3  . Years of education: Not on file  . Highest education level: Not on file  Occupational History  . Occupation: retired    Comment: Retired  Tobacco Use  . Smoking status: Former Smoker    Packs/day: 0.25    Years: 28.00    Pack years: 7.00    Start date: 41    Quit date: 2000    Years since quitting: 21.8  . Smokeless tobacco: Never Used  Vaping Use  . Vaping Use: Never used  Substance and Sexual Activity  . Alcohol use: No    Alcohol/week: 0.0 standard drinks  . Drug use: No  . Sexual activity: Not on file  Other Topics Concern  . Not on file  Social History Narrative  . Not on file   Social Determinants of Health   Financial Resource Strain:   . Difficulty of Paying Living Expenses: Not  on file  Food Insecurity:   . Worried About Charity fundraiser in the Last Year: Not on file  . Ran Out of Food in the Last Year: Not on file  Transportation Needs:   . Lack of Transportation (Medical): Not on file  . Lack of Transportation (Non-Medical): Not on file  Physical Activity:   . Days of Exercise per Week: Not on file  . Minutes of Exercise per Session: Not on file  Stress:   . Feeling of Stress : Not on file  Social Connections:   . Frequency of Communication with Friends and Family: Not on file  . Frequency of Social Gatherings with Friends and Family: Not on file  . Attends Religious Services: Not on file  . Active Member of Clubs or Organizations: Not on file  . Attends Archivist Meetings: Not on file  . Marital Status: Not on file  Intimate Partner Violence:   . Fear of Current or Ex-Partner: Not on file  . Emotionally Abused: Not on file  . Physically Abused: Not on file  . Sexually  Abused: Not on file    Outpatient Medications Prior to Visit  Medication Sig Dispense Refill  . acetaminophen (TYLENOL) 500 MG tablet Take 500 mg by mouth every 6 (six) hours as needed for moderate pain.    Marland Kitchen ALPRAZolam (XANAX) 0.25 MG tablet Take 0.25 mg by mouth daily.     Marland Kitchen aspirin 81 MG tablet Take 81 mg by mouth daily.    Marland Kitchen atorvastatin (LIPITOR) 80 MG tablet TAKE 1 TABLET BY MOUTH EVERY DAY 90 tablet 1  . Bismuth Tribromoph-Petrolatum (XEROFORM PETROLAT PATCH 4"X4") PADS USE AS DIRECTED TWICE A DAY EXTERNALLY 30 DAYS  4  . carvedilol (COREG) 12.5 MG tablet TAKE 1 TABLET (12.5 MG TOTAL) BY MOUTH 2 (TWO) TIMES DAILY WITH A MEAL. 180 tablet 3  . celecoxib (CELEBREX) 200 MG capsule Take 200 mg by mouth daily.     . clonazePAM (KLONOPIN) 0.5 MG tablet Take 0.5 mg by mouth at bedtime.     . diclofenac Sodium (VOLTAREN) 1 % GEL diclofenac 1 % topical gel    . ELIDEL 1 % cream APPLY TO AFFECTED AREA TWICE A DAY AS NEEDED FOR 14 DAYS  2  . escitalopram (LEXAPRO) 20 MG tablet Take 20 mg by mouth daily.    Marland Kitchen ezetimibe (ZETIA) 10 MG tablet TAKE 1 TABLET BY MOUTH EVERY DAY 90 tablet 1  . fluticasone (FLONASE) 50 MCG/ACT nasal spray USE 2 SPRAYS IN EACH NOSTRIL EVERY DAY 48 mL 2  . gabapentin (NEURONTIN) 600 MG tablet TAKE 1 TABLET BY MOUTH TWICE A DAY 180 tablet 1  . lisinopril (ZESTRIL) 20 MG tablet Take 1 tablet (20 mg total) by mouth daily. 90 tablet 3  . LOTEMAX 0.5 % GEL SMARTSIG:1 Drop(s) In Eye(s) Twice Daily PRN    . pantoprazole (PROTONIX) 40 MG tablet TAKE 1 TABLET BY MOUTH TWO TIMES DAILY. 180 tablet 3  . RESTASIS 0.05 % ophthalmic emulsion Place 1 drop into both eyes 2 (two) times daily.     . tacrolimus (PROTOPIC) 0.1 % ointment APPLY TO AFFECTED AREA ONCE A DAY    . tamsulosin (FLOMAX) 0.4 MG CAPS capsule TAKE 1 CAPSULE (0.4 MG TOTAL) BY MOUTH DAILY AFTER SUPPER. 90 capsule 1  . triamcinolone cream (KENALOG) 0.1 % APPLY TOPICALLY TO THE AFFECTED AREA 2 (TWO) TIMES DAILY AS NEEDED. 80  g 1  . zolpidem (AMBIEN) 10 MG tablet Take  10 mg by mouth at bedtime as needed for sleep.    Marland Kitchen azelastine (ASTELIN) 0.1 % nasal spray PLACE 1 SPRAY INTO BOTH NOSTRILS 2 (TWO) TIMES DAILY AS DIRECTED 30 mL 1  . QUEtiapine (SEROQUEL) 25 MG tablet Take 25 mg by mouth at bedtime. (Patient not taking: Reported on 02/01/2020)     No facility-administered medications prior to visit.    Allergies  Allergen Reactions  . Latex Other (See Comments)    Other  . Tape     Use Paper Tape   . Sulfa Antibiotics Rash    ROS Review of Systems  Constitutional: Negative for chills and fever.  HENT: Negative for congestion, ear discharge and ear pain.   Gastrointestinal: Negative for abdominal pain, blood in stool and constipation.  Neurological: Negative for dizziness.      Objective:    Physical Exam Vitals reviewed.  HENT:     Ears:     Comments: Right eardrum and ear canal are normal.  Left eardrum is normal.  He has a very small amount of dried looking blood on the inferior portion of the canal otherwise normal exam.  No evidence for ear effusion.  No left eardrum trauma. Cardiovascular:     Rate and Rhythm: Normal rate and regular rhythm.  Pulmonary:     Effort: Pulmonary effort is normal.     Breath sounds: Normal breath sounds.  Genitourinary:    Comments: Rectal exam reveals no external hemorrhoids.  He does have a very small somewhat superficial fissure at the 12 o'clock position.  No active bleeding.    BP 121/63   Pulse (!) 59   Ht 6\' 2"  (1.88 m)   Wt 220 lb (99.8 kg)   BMI 28.25 kg/m  Wt Readings from Last 3 Encounters:  02/03/20 220 lb (99.8 kg)  02/01/20 217 lb (98.4 kg)  11/01/19 217 lb 3.2 oz (98.5 kg)     Health Maintenance Due  Topic Date Due  . FOOT EXAM  Never done  . COLONOSCOPY  01/07/2020    There are no preventive care reminders to display for this patient.  Lab Results  Component Value Date   TSH 1.40 03/15/2019   Lab Results  Component Value  Date   WBC 8.0 03/15/2019   HGB 13.1 03/15/2019   HCT 39.8 03/15/2019   MCV 94.3 03/15/2019   PLT 141.0 (L) 03/15/2019   Lab Results  Component Value Date   NA 139 11/01/2019   K 4.8 11/01/2019   CO2 33 (H) 11/01/2019   GLUCOSE 114 (H) 11/01/2019   BUN 15 11/01/2019   CREATININE 1.05 11/01/2019   BILITOT 0.6 11/01/2019   ALKPHOS 58 03/15/2019   AST 18 11/01/2019   ALT 16 11/01/2019   PROT 6.7 11/01/2019   ALBUMIN 4.4 03/15/2019   CALCIUM 9.7 11/01/2019   ANIONGAP 7 04/03/2014   GFR 73.57 03/15/2019   Lab Results  Component Value Date   CHOL 143 11/01/2019   Lab Results  Component Value Date   HDL 44 11/01/2019   Lab Results  Component Value Date   LDLCALC 77 11/01/2019   Lab Results  Component Value Date   TRIG 136 11/01/2019   Lab Results  Component Value Date   CHOLHDL 3.3 11/01/2019   Lab Results  Component Value Date   HGBA1C 6.2 (H) 11/01/2019      Assessment & Plan:   #1 left ear symptoms with basically normal exam except for very small amount of  dried blood on the inferior canal.  No signs of secondary infection.  No evidence for effusion  -Reassurance and observe for now  #2 small anal fissure -Consider sitz baths -Measures to reduce constipation -We discussed possible use of topical such as nitroglycerin but at this point he declines.  He will try some Desitin since pain is minimal at this time.  No orders of the defined types were placed in this encounter.   Follow-up: No follow-ups on file.    Carolann Littler, MD

## 2020-02-06 DIAGNOSIS — M50922 Unspecified cervical disc disorder at C5-C6 level: Secondary | ICD-10-CM | POA: Diagnosis not present

## 2020-02-06 DIAGNOSIS — L739 Follicular disorder, unspecified: Secondary | ICD-10-CM | POA: Diagnosis not present

## 2020-02-06 DIAGNOSIS — M25511 Pain in right shoulder: Secondary | ICD-10-CM | POA: Diagnosis not present

## 2020-02-06 DIAGNOSIS — M5136 Other intervertebral disc degeneration, lumbar region: Secondary | ICD-10-CM | POA: Diagnosis not present

## 2020-02-06 DIAGNOSIS — M50923 Unspecified cervical disc disorder at C6-C7 level: Secondary | ICD-10-CM | POA: Diagnosis not present

## 2020-02-10 ENCOUNTER — Other Ambulatory Visit: Payer: Self-pay | Admitting: Family Medicine

## 2020-02-13 DIAGNOSIS — M25511 Pain in right shoulder: Secondary | ICD-10-CM | POA: Diagnosis not present

## 2020-02-13 DIAGNOSIS — M50923 Unspecified cervical disc disorder at C6-C7 level: Secondary | ICD-10-CM | POA: Diagnosis not present

## 2020-02-13 DIAGNOSIS — M50922 Unspecified cervical disc disorder at C5-C6 level: Secondary | ICD-10-CM | POA: Diagnosis not present

## 2020-02-13 DIAGNOSIS — M5136 Other intervertebral disc degeneration, lumbar region: Secondary | ICD-10-CM | POA: Diagnosis not present

## 2020-02-15 ENCOUNTER — Other Ambulatory Visit: Payer: Self-pay | Admitting: Family Medicine

## 2020-02-15 ENCOUNTER — Encounter: Payer: BC Managed Care – PPO | Admitting: Gastroenterology

## 2020-02-17 ENCOUNTER — Telehealth (INDEPENDENT_AMBULATORY_CARE_PROVIDER_SITE_OTHER): Payer: BC Managed Care – PPO | Admitting: Internal Medicine

## 2020-02-17 ENCOUNTER — Encounter: Payer: Self-pay | Admitting: Internal Medicine

## 2020-02-17 VITALS — BP 107/63 | HR 75 | Temp 97.6°F

## 2020-02-17 DIAGNOSIS — J069 Acute upper respiratory infection, unspecified: Secondary | ICD-10-CM

## 2020-02-17 DIAGNOSIS — Z20822 Contact with and (suspected) exposure to covid-19: Secondary | ICD-10-CM | POA: Diagnosis not present

## 2020-02-17 MED ORDER — BENZONATATE 200 MG PO CAPS: 200.0000 mg | ORAL_CAPSULE | Freq: Three times a day (TID) | ORAL | 0 refills | Status: DC | PRN

## 2020-02-17 NOTE — Progress Notes (Signed)
Virtual Visit via Video Note  I connected with@ on 02/17/20 at  2:30 PM EST by a video enabled telemedicine application and verified that I am speaking with the correct person using two identifiers. Location patient: home Location provider:work  office Persons participating in the virtual visit: patient, provider  [place] national recommendations  regarding COVID 19 pandemic   video visit is advised over in office visit for this patient.  Patient aware  of the limitations of evaluation and management by telemedicine and  availability of in person appointments. and agreed to proceed.   HPI: [facility] presents for video visit  PCP appt NA Was in his usual state of health until yesterday when he began feeling sick stuffy nose little bit of congestion and a drainage type cough.  No fever but somewhat tired. He believes some of his exhaustion is from playing with his grandkids all week ages 92 and 50.  They just left yesterday.  No one else was sick during the holidays He has had the Covid series and the booster vaccine on September 29.  Has scheduled a Covid test at 330.  Of note it was difficult for him to find a testing site. He has a cough is taken some guaifenesin and some leftover benzoate 200 mg.  Asked about cough relief No shortness of breath hemoptysis. He is taking his Flonase and Astelin no spray.  ROS: See pertinent positives and negatives per HPI.  Past Medical History:  Diagnosis Date  . Allergy   . Arthritis   . CAD (coronary artery disease) 6/14   Cardiac catheterization 6/27/ 2014 ejection fraction 35-40%, 30% proximal left circumflex, 100% tiny obtuse marginal 1 with collaterals, 50% LAD, 50% D1, 100% RCA with collaterals  . Chicken pox   . Chronic back pain   . Colon polyps   . Depression   . Diabetes mellitus ([place])    Diet controlled- OFF metformin 02-01-20  . GERD (gastroesophageal reflux disease)   . Hyperlipidemia   . Hypertension   . Kidney stone   .  Myocardial infarction [facility])    ? year- cath 2014/ june     Past Surgical History:  Procedure Laterality Date  . COLONOSCOPY    . CRYO INTERCOSTAL NERVE BLOCK     double sacrum nerve block  . epidural injections     multiple- L5 S1 and cervical neck pain issues as well   . POLYPECTOMY    . TONSILLECTOMY  1963  . TRIGGER FINGER RELEASE  2019    Family History  Problem Relation Age of Onset  . Melanoma Mother        metastatic  . Arthritis Father   . Colon polyps Father   . Hyperlipidemia Maternal Grandmother   . Heart disease Maternal Grandmother   . Arthritis Paternal Grandmother   . Clotting disorder Paternal Grandmother   . Clotting disorder Paternal Grandfather   . Liver cancer Paternal Grandfather   . Colon cancer Neg Hx   . Esophageal cancer Neg Hx   . Rectal cancer Neg Hx   . Stomach cancer Neg Hx     Social History   Tobacco Use  . Smoking status: Former Smoker    Packs/day: 0.25    Years: 28.00    Pack years: 7.00    Start date: 46    Quit date: 2000    Years since quitting: 21.9  . Smokeless tobacco: Never Used  Vaping Use  . Vaping Use: Never used  Substance  Use Topics  . Alcohol use: No    Alcohol/week: 0.0 standard drinks  . Drug use: No      Current Outpatient Medications:  .  acetaminophen (TYLENOL) 500 MG tablet, Take 500 mg by mouth every 6 (six) hours as needed for moderate pain., Disp: , Rfl:  .  ALPRAZolam (XANAX) 0.25 MG tablet, Take 0.25 mg by mouth daily. , Disp: , Rfl:  .  aspirin 81 MG tablet, Take 81 mg by mouth daily., Disp: , Rfl:  .  atorvastatin (LIPITOR) 80 MG tablet, TAKE 1 TABLET BY MOUTH EVERY DAY, Disp: 90 tablet, Rfl: 1 .  azelastine (ASTELIN) 0.1 % nasal spray, PLACE 1 SPRAY INTO BOTH NOSTRILS 2 (TWO) TIMES DAILY AS DIRECTED, Disp: 30 mL, Rfl: 1 .  Bismuth Tribromoph-Petrolatum (XEROFORM PETROLAT PATCH 4"X4") PADS, USE AS DIRECTED TWICE A DAY EXTERNALLY 30 DAYS, Disp: , Rfl: 4 .  carvedilol (COREG) 12.5 MG tablet, TAKE  1 TABLET (12.5 MG TOTAL) BY MOUTH 2 (TWO) TIMES DAILY WITH A MEAL., Disp: 180 tablet, Rfl: 3 .  celecoxib (CELEBREX) 200 MG capsule, Take 200 mg by mouth daily. , Disp: , Rfl:  .  clonazePAM (KLONOPIN) 0.5 MG tablet, Take 0.5 mg by mouth at bedtime. , Disp: , Rfl:  .  diclofenac Sodium (VOLTAREN) 1 % GEL, diclofenac 1 % topical gel, Disp: , Rfl:  .  ELIDEL 1 % cream, APPLY TO AFFECTED AREA TWICE A DAY AS NEEDED FOR 14 DAYS, Disp: , Rfl: 2 .  escitalopram (LEXAPRO) 20 MG tablet, Take 20 mg by mouth daily., Disp: , Rfl:  .  ezetimibe (ZETIA) 10 MG tablet, TAKE 1 TABLET BY MOUTH EVERY DAY, Disp: 90 tablet, Rfl: 1 .  fluticasone (FLONASE) 50 MCG/ACT nasal spray, USE 2 SPRAYS IN EACH NOSTRIL EVERY DAY, Disp: 48 mL, Rfl: 2 .  gabapentin (NEURONTIN) 600 MG tablet, TAKE 1 TABLET BY MOUTH TWICE A DAY, Disp: 180 tablet, Rfl: 1 .  lisinopril (ZESTRIL) 20 MG tablet, Take 1 tablet (20 mg total) by mouth daily., Disp: 90 tablet, Rfl: 3 .  LOTEMAX 0.5 % GEL, SMARTSIG:1 Drop(s) In Eye(s) Twice Daily PRN, Disp: , Rfl:  .  pantoprazole (PROTONIX) 40 MG tablet, TAKE 1 TABLET BY MOUTH EVERY DAY, Disp: 90 tablet, Rfl: 3 .  RESTASIS 0.05 % ophthalmic emulsion, Place 1 drop into both eyes 2 (two) times daily. , Disp: , Rfl:  .  tacrolimus (PROTOPIC) 0.1 % ointment, APPLY TO AFFECTED AREA ONCE A DAY, Disp: , Rfl:  .  tamsulosin (FLOMAX) 0.4 MG CAPS capsule, TAKE 1 CAPSULE (0.4 MG TOTAL) BY MOUTH DAILY AFTER SUPPER., Disp: 90 capsule, Rfl: 1 .  triamcinolone (KENALOG) 0.1 %, APPLY TOPICALLY TO THE AFFECTED AREA 2 (TWO) TIMES DAILY AS NEEDED., Disp: 80 g, Rfl: 1 .  triamcinolone (KENALOG) 0.1 %, APPLY TOPICALLY TO THE AFFECTED AREA 2 (TWO) TIMES DAILY AS NEEDED., Disp: 30 g, Rfl: 1 .  zolpidem (AMBIEN) 10 MG tablet, Take 10 mg by mouth at bedtime as needed for sleep., Disp: , Rfl:  .  benzonatate (TESSALON) 200 MG capsule, Take 1 capsule (200 mg total) by mouth 3 (three) times daily as needed for cough., Disp: 24 capsule,  Rfl: 0 .  clindamycin (CLEOCIN T) 1 % external solution, , Disp: , Rfl:   EXAM: BP Readings from Last 3 Encounters:  02/17/20 107/63  02/03/20 121/63  11/01/19 118/60    VITALS per patient if applicable:  GENERAL: alert, oriented, appears well and in no  acute distress minimal congestion normal speech and occasional bronchial cough  HEENT: atraumatic, conjunttiva clear, no obvious abnormalities on inspection of external nose and ears  NECK: normal movements of the head and neck  LUNGS: on inspection no signs of respiratory distress, breathing rate appears normal, no obvious gross SOB, gasping or wheezing  CV: no obvious cyanosis  MS: moves all visible extremities without noticeable abnormality  PSYCH/NEURO: pleasant and cooperative, no obvious depression or anxiety, speech and thought processing grossly intact Lab Results  Component Value Date   WBC 8.0 03/15/2019   HGB 13.1 03/15/2019   HCT 39.8 03/15/2019   PLT 141.0 (L) 03/15/2019   GLUCOSE 114 (H) 11/01/2019   CHOL 143 11/01/2019   TRIG 136 11/01/2019   HDL 44 11/01/2019   LDLDIRECT 70.0 03/20/2016   LDLCALC 77 11/01/2019   ALT 16 11/01/2019   AST 18 11/01/2019   NA 139 11/01/2019   K 4.8 11/01/2019   CL 102 11/01/2019   CREATININE 1.05 11/01/2019   BUN 15 11/01/2019   CO2 33 (H) 11/01/2019   TSH 1.40 03/15/2019   PSA 1.40 03/15/2019   HGBA1C 6.2 (H) 11/01/2019   MICROALBUR <0.7 03/20/2016    ASSESSMENT AND PLAN:  Discussed the following assessment and plan:    ICD-10-CM   1. Upper respiratory infection with cough and congestion  J06.9    suspect viral  get covid testing  sx rx and follow for alam sx    Disc  limitations of cough  meds and risk etc . Will keep [place] informed    mcaby would still be avail if positive and needed. But  With 3   immuniz reassuring   Counseled.   Expectant management and discussion of plan and treatment with opportunity to ask questions and all were answered. The patient  agreed with the plan and demonstrated an understanding of the instructions.   Advised to call back or seek an in-person evaluation if worsening  or having  further concerns . Return if symptoms worsen or fail to improve as expected.    Shanon Ace, MD

## 2020-02-20 ENCOUNTER — Telehealth (INDEPENDENT_AMBULATORY_CARE_PROVIDER_SITE_OTHER): Payer: BC Managed Care – PPO | Admitting: Family Medicine

## 2020-02-20 ENCOUNTER — Encounter: Payer: Self-pay | Admitting: Family Medicine

## 2020-02-20 ENCOUNTER — Telehealth: Payer: Self-pay | Admitting: Family Medicine

## 2020-02-20 VITALS — BP 128/70 | HR 54 | Temp 97.6°F

## 2020-02-20 DIAGNOSIS — J069 Acute upper respiratory infection, unspecified: Secondary | ICD-10-CM | POA: Diagnosis not present

## 2020-02-20 MED ORDER — HYDROCODONE-HOMATROPINE 5-1.5 MG/5ML PO SYRP: 5.0000 mL | ORAL_SOLUTION | Freq: Four times a day (QID) | ORAL | 0 refills | Status: AC | PRN

## 2020-02-20 NOTE — Telephone Encounter (Signed)
Pt has an appt today with PCP.

## 2020-02-20 NOTE — Telephone Encounter (Signed)
error 

## 2020-02-20 NOTE — Progress Notes (Signed)
Patient ID: Peter Snyder, male   DOB: 04/28/1955, 64 y.o.   MRN: [id number]  This visit type was conducted due to national recommendations for restrictions regarding the COVID-19 pandemic in an effort to limit this patient's exposure and mitigate transmission in our community.   Virtual Visit via Video Note  I connected with Peter Snyder on 02/20/20 at  4:15 PM EST by a video enabled telemedicine application and verified that I am speaking with the correct person using two identifiers.  Location patient: home Location provider:work or home office Persons participating in the virtual visit: patient, provider  I discussed the limitations of evaluation and management by telemedicine and the availability of in person appointments. The patient expressed understanding and agreed to proceed.   HPI: Peter Snyder had called last week with some upper respiratory symptoms and cough.  He had been around a couple grandchildren around Thanksgiving who ended up having respiratory symptoms.  He went on Friday to local urgent care and had rapid test for Covid which came back negative.  PCR test was also sent at the same time.  He was alerted Sunday morning that this was negative as well.  He has had some mostly dry cough and worse at night.  He was prescribed Tessalon Perles but this is not helping much.  Cough has been fairly disruptive at night.  No fever.  No dyspnea.  Does have some nasal congestion at night.  His blood pressures have been on the low side in the mornings with readings around 90 systolic but no associated dizziness.  He is keeping down fluids well.   ROS: See pertinent positives and negatives per HPI.  Past Medical History:  Diagnosis Date  . Allergy   . Arthritis   . CAD (coronary artery disease) 6/14   Cardiac catheterization 6/27/ 2014 ejection fraction 35-40%, 30% proximal left circumflex, 100% tiny obtuse marginal 1 with collaterals, 50% LAD, 50% D1, 100% RCA with collaterals  . Chicken pox    . Chronic back pain   . Colon polyps   . Depression   . Diabetes mellitus (Peter Snyder)    Diet controlled- OFF metformin 02-01-20  . GERD (gastroesophageal reflux disease)   . Hyperlipidemia   . Hypertension   . Kidney stone   . Myocardial infarction [facility])    ? year- cath 2014/ june     Past Surgical History:  Procedure Laterality Date  . COLONOSCOPY    . CRYO INTERCOSTAL NERVE BLOCK     double sacrum nerve block  . epidural injections     multiple- L5 S1 and cervical neck pain issues as well   . POLYPECTOMY    . TONSILLECTOMY  1963  . TRIGGER FINGER RELEASE  2019    Family History  Problem Relation Age of Onset  . Melanoma Mother        metastatic  . Arthritis Father   . Colon polyps Father   . Hyperlipidemia Maternal Grandmother   . Heart disease Maternal Grandmother   . Arthritis Paternal Grandmother   . Clotting disorder Paternal Grandmother   . Clotting disorder Paternal Grandfather   . Liver cancer Paternal Grandfather   . Colon cancer Neg Hx   . Esophageal cancer Neg Hx   . Rectal cancer Neg Hx   . Stomach cancer Neg Hx     SOCIAL HX: Non-smoker   Current Outpatient Medications:  .  acetaminophen (TYLENOL) 500 MG tablet, Take 500 mg by mouth every 6 (six) hours as needed for  moderate pain., Disp: , Rfl:  .  ALPRAZolam (XANAX) 0.25 MG tablet, Take 0.25 mg by mouth daily. , Disp: , Rfl:  .  aspirin 81 MG tablet, Take 81 mg by mouth daily., Disp: , Rfl:  .  atorvastatin (LIPITOR) 80 MG tablet, TAKE 1 TABLET BY MOUTH EVERY DAY, Disp: 90 tablet, Rfl: 1 .  azelastine (ASTELIN) 0.1 % nasal spray, PLACE 1 SPRAY INTO BOTH NOSTRILS 2 (TWO) TIMES DAILY AS DIRECTED, Disp: 30 mL, Rfl: 1 .  benzonatate (TESSALON) 200 MG capsule, Take 1 capsule (200 mg total) by mouth 3 (three) times daily as needed for cough., Disp: 24 capsule, Rfl: 0 .  Bismuth Tribromoph-Petrolatum (XEROFORM PETROLAT PATCH 4"X4") PADS, USE AS DIRECTED TWICE A DAY EXTERNALLY 30 DAYS, Disp: , Rfl: 4 .   carvedilol (COREG) 12.5 MG tablet, TAKE 1 TABLET (12.5 MG TOTAL) BY MOUTH 2 (TWO) TIMES DAILY WITH A MEAL., Disp: 180 tablet, Rfl: 3 .  celecoxib (CELEBREX) 200 MG capsule, Take 200 mg by mouth daily. , Disp: , Rfl:  .  clindamycin (CLEOCIN T) 1 % external solution, , Disp: , Rfl:  .  clonazePAM (KLONOPIN) 0.5 MG tablet, Take 0.5 mg by mouth at bedtime. , Disp: , Rfl:  .  diclofenac Sodium (VOLTAREN) 1 % GEL, diclofenac 1 % topical gel, Disp: , Rfl:  .  ELIDEL 1 % cream, APPLY TO AFFECTED AREA TWICE A DAY AS NEEDED FOR 14 DAYS, Disp: , Rfl: 2 .  escitalopram (LEXAPRO) 20 MG tablet, Take 20 mg by mouth daily., Disp: , Rfl:  .  ezetimibe (ZETIA) 10 MG tablet, TAKE 1 TABLET BY MOUTH EVERY DAY, Disp: 90 tablet, Rfl: 1 .  fluticasone (FLONASE) 50 MCG/ACT nasal spray, USE 2 SPRAYS IN EACH NOSTRIL EVERY DAY, Disp: 48 mL, Rfl: 2 .  gabapentin (NEURONTIN) 600 MG tablet, TAKE 1 TABLET BY MOUTH TWICE A DAY, Disp: 180 tablet, Rfl: 1 .  lisinopril (ZESTRIL) 20 MG tablet, Take 1 tablet (20 mg total) by mouth daily., Disp: 90 tablet, Rfl: 3 .  LOTEMAX 0.5 % GEL, SMARTSIG:1 Drop(s) In Eye(s) Twice Daily PRN, Disp: , Rfl:  .  pantoprazole (PROTONIX) 40 MG tablet, TAKE 1 TABLET BY MOUTH EVERY DAY, Disp: 90 tablet, Rfl: 3 .  RESTASIS 0.05 % ophthalmic emulsion, Place 1 drop into both eyes 2 (two) times daily. , Disp: , Rfl:  .  tacrolimus (PROTOPIC) 0.1 % ointment, APPLY TO AFFECTED AREA ONCE A DAY, Disp: , Rfl:  .  tamsulosin (FLOMAX) 0.4 MG CAPS capsule, TAKE 1 CAPSULE (0.4 MG TOTAL) BY MOUTH DAILY AFTER SUPPER., Disp: 90 capsule, Rfl: 1 .  triamcinolone (KENALOG) 0.1 %, APPLY TOPICALLY TO THE AFFECTED AREA 2 (TWO) TIMES DAILY AS NEEDED., Disp: 80 g, Rfl: 1 .  triamcinolone (KENALOG) 0.1 %, APPLY TOPICALLY TO THE AFFECTED AREA 2 (TWO) TIMES DAILY AS NEEDED., Disp: 30 g, Rfl: 1 .  zolpidem (AMBIEN) 10 MG tablet, Take 10 mg by mouth at bedtime as needed for sleep., Disp: , Rfl:  .  HYDROcodone-homatropine (HYCODAN)  5-1.5 MG/5ML syrup, Take 5 mLs by mouth every 6 (six) hours as needed for up to 10 days., Disp: 120 mL, Rfl: 0  EXAM:  VITALS per patient if applicable:  GENERAL: alert, oriented, appears well and in no acute distress  HEENT: atraumatic, conjunttiva clear, no obvious abnormalities on inspection of external nose and ears  NECK: normal movements of the head and neck  LUNGS: on inspection no signs of respiratory distress, breathing  rate appears normal, no obvious gross SOB, gasping or wheezing  CV: no obvious cyanosis  MS: moves all visible extremities without noticeable abnormality  PSYCH/NEURO: pleasant and cooperative, no obvious depression or anxiety, speech and thought processing grossly intact  ASSESSMENT AND PLAN:  Discussed the following assessment and plan:  Cough probably secondary to acute viral upper respiratory infection.  Patient had both rapid and PCR Covid test which were negative.  He is in no respiratory distress.  -Continue plenty of fluids and rest -Follow-up immediately for any fever or worsening symptoms -He requested cough syrup.  He is taking Hycodan in the past.  We explained that if he has to take Hycodan at night for severe cough would need to hold his Klonopin it would also suggest reducing his Ambien to half tablet     I discussed the assessment and treatment plan with the patient. The patient was provided an opportunity to ask questions and all were answered. The patient agreed with the plan and demonstrated an understanding of the instructions.   The patient was advised to call back or seek an in-person evaluation if the symptoms worsen or if the condition fails to improve as anticipated.     Carolann Littler, MD

## 2020-02-22 DIAGNOSIS — F901 Attention-deficit hyperactivity disorder, predominantly hyperactive type: Secondary | ICD-10-CM | POA: Diagnosis not present

## 2020-02-22 DIAGNOSIS — F429 Obsessive-compulsive disorder, unspecified: Secondary | ICD-10-CM | POA: Diagnosis not present

## 2020-02-23 DIAGNOSIS — Z713 Dietary counseling and surveillance: Secondary | ICD-10-CM | POA: Diagnosis not present

## 2020-02-24 ENCOUNTER — Encounter: Payer: Self-pay | Admitting: Adult Health

## 2020-02-24 ENCOUNTER — Telehealth (INDEPENDENT_AMBULATORY_CARE_PROVIDER_SITE_OTHER): Payer: BC Managed Care – PPO | Admitting: Adult Health

## 2020-02-24 VITALS — BP 109/66 | HR 62 | Temp 98.5°F | Ht 74.0 in | Wt 209.0 lb

## 2020-02-24 DIAGNOSIS — J069 Acute upper respiratory infection, unspecified: Secondary | ICD-10-CM | POA: Diagnosis not present

## 2020-02-24 MED ORDER — PREDNISONE 10 MG PO TABS: ORAL_TABLET | ORAL | 0 refills | Status: DC

## 2020-02-24 MED ORDER — BENZONATATE 200 MG PO CAPS: 200.0000 mg | ORAL_CAPSULE | Freq: Three times a day (TID) | ORAL | 0 refills | Status: DC | PRN

## 2020-02-24 NOTE — Progress Notes (Signed)
Virtual Visit via Video Note  I connected with [facility] on 02/24/20 at  4:00 PM EST by a video enabled telemedicine application and verified that I am speaking with the correct person using two identifiers.  Location patient: home Location provider:work or home office Persons participating in the virtual visit: patient, provider  I discussed the limitations of evaluation and management by telemedicine and the availability of in person appointments. The patient expressed understanding and agreed to proceed.   HPI: 64 year old male who is being evaluated today with continued viral URI with cough.  He was originally seen on 02/17/2020, soon after his symptoms started.  He was around a couple grandchildren during Thanksgiving who ended up having respiratory symptoms.  He has tested negative for Covid multiple times since his symptoms started.  Was also prescribed Tessalon Perles on 02/17/2020 which seemed to help slightly.  His cough has been disruptive at night and he is unable to get good rest.  He was then reevaluated on 02/20/2020 by his PCP for ongoing symptoms and was prescribed hydrocodone cough syrup  Today he reports that he continues to have a cough and his voice has become raspy.  He does feel better today than he did yesterday though.  He denies fevers or chills.  Feels weak due to not sleeping.  Reports that the hydrocodone cough syrup is not helping much.   ROS: See pertinent positives and negatives per HPI.  Past Medical History:  Diagnosis Date  . Allergy   . Arthritis   . CAD (coronary artery disease) 6/14   Cardiac catheterization 6/27/ 2014 ejection fraction 35-40%, 30% proximal left circumflex, 100% tiny obtuse marginal 1 with collaterals, 50% LAD, 50% D1, 100% RCA with collaterals  . Chicken pox   . Chronic back pain   . Colon polyps   . Depression   . Diabetes mellitus ([place])    Diet controlled- OFF metformin 02-01-20  . GERD (gastroesophageal reflux disease)   .  Hyperlipidemia   . Hypertension   . Kidney stone   . Myocardial infarction [facility])    ? year- cath 2014/ june     Past Surgical History:  Procedure Laterality Date  . COLONOSCOPY    . CRYO INTERCOSTAL NERVE BLOCK     double sacrum nerve block  . epidural injections     multiple- L5 S1 and cervical neck pain issues as well   . POLYPECTOMY    . TONSILLECTOMY  1963  . TRIGGER FINGER RELEASE  2019    Family History  Problem Relation Age of Onset  . Melanoma Mother        metastatic  . Arthritis Father   . Colon polyps Father   . Hyperlipidemia Maternal Grandmother   . Heart disease Maternal Grandmother   . Arthritis Paternal Grandmother   . Clotting disorder Paternal Grandmother   . Clotting disorder Paternal Grandfather   . Liver cancer Paternal Grandfather   . Colon cancer Neg Hx   . Esophageal cancer Neg Hx   . Rectal cancer Neg Hx   . Stomach cancer Neg Hx        Current Outpatient Medications:  .  acetaminophen (TYLENOL) 500 MG tablet, Take 500 mg by mouth every 6 (six) hours as needed for moderate pain., Disp: , Rfl:  .  ALPRAZolam (XANAX) 0.25 MG tablet, Take 0.25 mg by mouth daily., Disp: , Rfl:  .  aspirin 81 MG tablet, Take 81 mg by mouth daily., Disp: , Rfl:  .  atorvastatin (LIPITOR) 80 MG tablet, TAKE 1 TABLET BY MOUTH EVERY DAY, Disp: 90 tablet, Rfl: 1 .  azelastine (ASTELIN) 0.1 % nasal spray, PLACE 1 SPRAY INTO BOTH NOSTRILS 2 (TWO) TIMES DAILY AS DIRECTED, Disp: 30 mL, Rfl: 1 .  benzonatate (TESSALON) 200 MG capsule, Take 1 capsule (200 mg total) by mouth 3 (three) times daily as needed for cough., Disp: 24 capsule, Rfl: 0 .  Bismuth Tribromoph-Petrolatum (XEROFORM PETROLAT PATCH 4"X4") PADS, USE AS DIRECTED TWICE A DAY EXTERNALLY 30 DAYS, Disp: , Rfl: 4 .  carvedilol (COREG) 12.5 MG tablet, TAKE 1 TABLET (12.5 MG TOTAL) BY MOUTH 2 (TWO) TIMES DAILY WITH A MEAL., Disp: 180 tablet, Rfl: 3 .  celecoxib (CELEBREX) 200 MG capsule, Take 200 mg by mouth daily. ,  Disp: , Rfl:  .  clonazePAM (KLONOPIN) 0.5 MG tablet, Take 0.5 mg by mouth at bedtime. , Disp: , Rfl:  .  diclofenac Sodium (VOLTAREN) 1 % GEL, diclofenac 1 % topical gel, Disp: , Rfl:  .  ELIDEL 1 % cream, APPLY TO AFFECTED AREA TWICE A DAY AS NEEDED FOR 14 DAYS, Disp: , Rfl: 2 .  escitalopram (LEXAPRO) 20 MG tablet, Take 20 mg by mouth daily., Disp: , Rfl:  .  ezetimibe (ZETIA) 10 MG tablet, TAKE 1 TABLET BY MOUTH EVERY DAY, Disp: 90 tablet, Rfl: 1 .  fluticasone (FLONASE) 50 MCG/ACT nasal spray, USE 2 SPRAYS IN EACH NOSTRIL EVERY DAY, Disp: 48 mL, Rfl: 2 .  gabapentin (NEURONTIN) 600 MG tablet, TAKE 1 TABLET BY MOUTH TWICE A DAY, Disp: 180 tablet, Rfl: 1 .  HYDROcodone-homatropine (HYCODAN) 5-1.5 MG/5ML syrup, Take 5 mLs by mouth every 6 (six) hours as needed for up to 10 days., Disp: 120 mL, Rfl: 0 .  lisinopril (ZESTRIL) 20 MG tablet, Take 1 tablet (20 mg total) by mouth daily., Disp: 90 tablet, Rfl: 3 .  LOTEMAX 0.5 % GEL, SMARTSIG:1 Drop(s) In Eye(s) Twice Daily PRN, Disp: , Rfl:  .  pantoprazole (PROTONIX) 40 MG tablet, TAKE 1 TABLET BY MOUTH EVERY DAY, Disp: 90 tablet, Rfl: 3 .  RESTASIS 0.05 % ophthalmic emulsion, Place 1 drop into both eyes 2 (two) times daily. , Disp: , Rfl:  .  tacrolimus (PROTOPIC) 0.1 % ointment, APPLY TO AFFECTED AREA ONCE A DAY, Disp: , Rfl:  .  tamsulosin (FLOMAX) 0.4 MG CAPS capsule, TAKE 1 CAPSULE (0.4 MG TOTAL) BY MOUTH DAILY AFTER SUPPER., Disp: 90 capsule, Rfl: 1 .  zolpidem (AMBIEN) 10 MG tablet, Take 10 mg by mouth at bedtime as needed for sleep., Disp: , Rfl:  .  clindamycin (CLEOCIN T) 1 % external solution, , Disp: , Rfl:  .  triamcinolone (KENALOG) 0.1 %, APPLY TOPICALLY TO THE AFFECTED AREA 2 (TWO) TIMES DAILY AS NEEDED. (Patient not taking: Reported on 02/24/2020), Disp: 80 g, Rfl: 1 .  triamcinolone (KENALOG) 0.1 %, APPLY TOPICALLY TO THE AFFECTED AREA 2 (TWO) TIMES DAILY AS NEEDED. (Patient not taking: Reported on 02/24/2020), Disp: 30 g, Rfl:  1  EXAM:  VITALS per patient if applicable:  GENERAL: alert, oriented, appears well and in no acute distress  HEENT: atraumatic, conjunttiva clear, no obvious abnormalities on inspection of external nose and ears  NECK: normal movements of the head and neck  LUNGS: on inspection no signs of respiratory distress, breathing rate appears normal, no obvious gross SOB, gasping or wheezing  CV: no obvious cyanosis  MS: moves all visible extremities without noticeable abnormality  PSYCH/NEURO: pleasant and cooperative, no  obvious depression or anxiety, speech and thought processing grossly intact  ASSESSMENT AND PLAN:  Discussed the following assessment and plan:  1. Viral URI with cough -Continues to appear viral.  He does not appear in any respiratory distress.  We will do prednisone and refill his Tessalon for him.  Advise follow-up with next week if not resolved - predniSONE (DELTASONE) 10 MG tablet; 40 mg x 3 days, 20 mg x 3 days, 10 mg x 3 days  Dispense: 21 tablet; Refill: 0 - benzonatate (TESSALON) 200 MG capsule; Take 1 capsule (200 mg total) by mouth 3 (three) times daily as needed for cough.  Dispense: 24 capsule; Refill: 0     I discussed the assessment and treatment plan with the patient. The patient was provided an opportunity to ask questions and all were answered. The patient agreed with the plan and demonstrated an understanding of the instructions.   The patient was advised to call back or seek an in-person evaluation if the symptoms worsen or if the condition fails to improve as anticipated.   Dorothyann Peng, NP

## 2020-02-27 ENCOUNTER — Encounter: Payer: Self-pay | Admitting: Adult Health

## 2020-02-27 DIAGNOSIS — M50923 Unspecified cervical disc disorder at C6-C7 level: Secondary | ICD-10-CM | POA: Diagnosis not present

## 2020-02-27 DIAGNOSIS — L600 Ingrowing nail: Secondary | ICD-10-CM | POA: Diagnosis not present

## 2020-02-27 DIAGNOSIS — M50922 Unspecified cervical disc disorder at C5-C6 level: Secondary | ICD-10-CM | POA: Diagnosis not present

## 2020-02-27 DIAGNOSIS — M25572 Pain in left ankle and joints of left foot: Secondary | ICD-10-CM | POA: Diagnosis not present

## 2020-02-27 DIAGNOSIS — M5136 Other intervertebral disc degeneration, lumbar region: Secondary | ICD-10-CM | POA: Diagnosis not present

## 2020-02-27 DIAGNOSIS — M25511 Pain in right shoulder: Secondary | ICD-10-CM | POA: Diagnosis not present

## 2020-02-28 ENCOUNTER — Encounter: Payer: Self-pay | Admitting: Adult Health

## 2020-02-28 ENCOUNTER — Encounter: Payer: BC Managed Care – PPO | Admitting: Gastroenterology

## 2020-02-28 ENCOUNTER — Encounter: Payer: Self-pay | Admitting: Family Medicine

## 2020-02-28 ENCOUNTER — Other Ambulatory Visit: Payer: Self-pay | Admitting: Family Medicine

## 2020-02-29 DIAGNOSIS — L570 Actinic keratosis: Secondary | ICD-10-CM | POA: Diagnosis not present

## 2020-02-29 DIAGNOSIS — L309 Dermatitis, unspecified: Secondary | ICD-10-CM | POA: Diagnosis not present

## 2020-02-29 MED ORDER — DOXYCYCLINE HYCLATE 100 MG PO TABS: 100.0000 mg | ORAL_TABLET | Freq: Two times a day (BID) | ORAL | 0 refills | Status: AC

## 2020-03-05 DIAGNOSIS — M5136 Other intervertebral disc degeneration, lumbar region: Secondary | ICD-10-CM | POA: Diagnosis not present

## 2020-03-05 DIAGNOSIS — M25511 Pain in right shoulder: Secondary | ICD-10-CM | POA: Diagnosis not present

## 2020-03-05 DIAGNOSIS — M50922 Unspecified cervical disc disorder at C5-C6 level: Secondary | ICD-10-CM | POA: Diagnosis not present

## 2020-03-05 DIAGNOSIS — M50923 Unspecified cervical disc disorder at C6-C7 level: Secondary | ICD-10-CM | POA: Diagnosis not present

## 2020-03-06 ENCOUNTER — Encounter: Payer: Self-pay | Admitting: Family Medicine

## 2020-03-06 DIAGNOSIS — J069 Acute upper respiratory infection, unspecified: Secondary | ICD-10-CM

## 2020-03-06 MED ORDER — BENZONATATE 200 MG PO CAPS: 200.0000 mg | ORAL_CAPSULE | Freq: Three times a day (TID) | ORAL | 0 refills | Status: DC | PRN

## 2020-03-07 DIAGNOSIS — M5136 Other intervertebral disc degeneration, lumbar region: Secondary | ICD-10-CM | POA: Diagnosis not present

## 2020-03-07 DIAGNOSIS — M25572 Pain in left ankle and joints of left foot: Secondary | ICD-10-CM | POA: Diagnosis not present

## 2020-03-07 DIAGNOSIS — G5762 Lesion of plantar nerve, left lower limb: Secondary | ICD-10-CM | POA: Diagnosis not present

## 2020-03-13 ENCOUNTER — Encounter: Payer: Self-pay | Admitting: Family Medicine

## 2020-03-13 DIAGNOSIS — M50922 Unspecified cervical disc disorder at C5-C6 level: Secondary | ICD-10-CM | POA: Diagnosis not present

## 2020-03-13 DIAGNOSIS — M5136 Other intervertebral disc degeneration, lumbar region: Secondary | ICD-10-CM | POA: Diagnosis not present

## 2020-03-13 DIAGNOSIS — M25511 Pain in right shoulder: Secondary | ICD-10-CM | POA: Diagnosis not present

## 2020-03-13 DIAGNOSIS — M50923 Unspecified cervical disc disorder at C6-C7 level: Secondary | ICD-10-CM | POA: Diagnosis not present

## 2020-03-15 DIAGNOSIS — Z1152 Encounter for screening for COVID-19: Secondary | ICD-10-CM | POA: Diagnosis not present

## 2020-03-20 ENCOUNTER — Encounter: Payer: BC Managed Care – PPO | Admitting: Family Medicine

## 2020-03-21 DIAGNOSIS — L600 Ingrowing nail: Secondary | ICD-10-CM | POA: Diagnosis not present

## 2020-03-21 DIAGNOSIS — M25572 Pain in left ankle and joints of left foot: Secondary | ICD-10-CM | POA: Diagnosis not present

## 2020-03-21 DIAGNOSIS — M25571 Pain in right ankle and joints of right foot: Secondary | ICD-10-CM | POA: Diagnosis not present

## 2020-03-26 DIAGNOSIS — M50922 Unspecified cervical disc disorder at C5-C6 level: Secondary | ICD-10-CM | POA: Diagnosis not present

## 2020-03-26 DIAGNOSIS — M50923 Unspecified cervical disc disorder at C6-C7 level: Secondary | ICD-10-CM | POA: Diagnosis not present

## 2020-03-26 DIAGNOSIS — M5136 Other intervertebral disc degeneration, lumbar region: Secondary | ICD-10-CM | POA: Diagnosis not present

## 2020-03-26 DIAGNOSIS — M25511 Pain in right shoulder: Secondary | ICD-10-CM | POA: Diagnosis not present

## 2020-03-28 ENCOUNTER — Other Ambulatory Visit: Payer: Self-pay | Admitting: Family Medicine

## 2020-03-30 ENCOUNTER — Other Ambulatory Visit: Payer: Self-pay

## 2020-03-30 ENCOUNTER — Ambulatory Visit (INDEPENDENT_AMBULATORY_CARE_PROVIDER_SITE_OTHER): Payer: BC Managed Care – PPO | Admitting: Family Medicine

## 2020-03-30 ENCOUNTER — Encounter: Payer: Self-pay | Admitting: Family Medicine

## 2020-03-30 VITALS — BP 124/80 | HR 74 | Temp 97.7°F | Resp 16 | Ht 74.0 in | Wt 212.0 lb

## 2020-03-30 DIAGNOSIS — Z0000 Encounter for general adult medical examination without abnormal findings: Secondary | ICD-10-CM | POA: Diagnosis not present

## 2020-03-30 LAB — CBC WITH DIFFERENTIAL/PLATELET
Basophils Absolute: 0 10*3/uL (ref 0.0–0.1)
Basophils Relative: 0.7 % (ref 0.0–3.0)
Eosinophils Absolute: 0.1 10*3/uL (ref 0.0–0.7)
Eosinophils Relative: 2.6 % (ref 0.0–5.0)
HCT: 38.4 % — ABNORMAL LOW (ref 39.0–52.0)
Hemoglobin: 13 g/dL (ref 13.0–17.0)
Lymphocytes Relative: 37.8 % (ref 12.0–46.0)
Lymphs Abs: 1.6 10*3/uL (ref 0.7–4.0)
MCHC: 33.9 g/dL (ref 30.0–36.0)
MCV: 90.6 fl (ref 78.0–100.0)
Monocytes Absolute: 0.4 10*3/uL (ref 0.1–1.0)
Monocytes Relative: 9.2 % (ref 3.0–12.0)
Neutro Abs: 2 10*3/uL (ref 1.4–7.7)
Neutrophils Relative %: 49.7 % (ref 43.0–77.0)
Platelets: 173 10*3/uL (ref 150.0–400.0)
RBC: 4.24 Mil/uL (ref 4.22–5.81)
RDW: 12.6 % (ref 11.5–15.5)
WBC: 4.1 10*3/uL (ref 4.0–10.5)

## 2020-03-30 LAB — BASIC METABOLIC PANEL
BUN: 15 mg/dL (ref 6–23)
CO2: 31 mEq/L (ref 19–32)
Calcium: 9.6 mg/dL (ref 8.4–10.5)
Chloride: 103 mEq/L (ref 96–112)
Creatinine, Ser: 1.14 mg/dL (ref 0.40–1.50)
GFR: 67.79 mL/min (ref >60.00)
Glucose, Bld: 122 mg/dL — ABNORMAL HIGH (ref 70–99)
Potassium: 4.8 mEq/L (ref 3.5–5.1)
Sodium: 140 mEq/L (ref 135–145)

## 2020-03-30 LAB — PSA: PSA: 1.7 ng/mL (ref 0.10–4.00)

## 2020-03-30 LAB — LIPID PANEL
Cholesterol: 146 mg/dL (ref 0–200)
HDL: 49.1 mg/dL (ref >39.00)
LDL Cholesterol: 72 mg/dL (ref 0–99)
NonHDL: 97.17
Total CHOL/HDL Ratio: 3
Triglycerides: 128 mg/dL (ref 0.0–149.0)
VLDL: 25.6 mg/dL (ref 0.0–40.0)

## 2020-03-30 LAB — HEPATIC FUNCTION PANEL
ALT: 20 U/L (ref 0–53)
AST: 20 U/L (ref 0–37)
Albumin: 4.8 g/dL (ref 3.5–5.2)
Alkaline Phosphatase: 56 U/L (ref 39–117)
Bilirubin, Direct: 0.1 mg/dL (ref 0.0–0.3)
Total Bilirubin: 0.5 mg/dL (ref 0.2–1.2)
Total Protein: 6.8 g/dL (ref 6.0–8.3)

## 2020-03-30 LAB — HEMOGLOBIN A1C: Hgb A1c MFr Bld: 6.5 % (ref 4.6–6.5)

## 2020-03-30 LAB — TSH: TSH: 1.62 u[IU]/mL (ref 0.35–4.50)

## 2020-03-30 NOTE — Progress Notes (Signed)
Established Patient Office Visit  Subjective:  Patient ID: Peter Snyder, male    DOB: 01/15/1956  Age: 65 y.o. MRN: [id number]  CC:  Chief Complaint  Patient presents with  . Annual Exam    HPI [facility] presents for physical exam.  He has history of CAD, hypertension, hyperglycemia, GERD, essential tremor, degenerative arthritis involving multiple joints, history of kidney stones, BPH, dyslipidemia.  Followed regularly by cardiology.  He walks about 3 to 4 miles per day.  Wife recently had [place].  He had no symptoms.  She is recovered.  Maintenance reviewed:\  -Scheduled for repeat colonoscopy in February -Flu vaccine already given -COVID vaccines given -Previous hepatitis C screen negative -Tetanus due 2025 -Shingles vaccines already given -Has had prior Pneumovax and Prevnar 13  Social history-retired from [employer].  He did EMS work prior to that.  Quit smoking 2000.  Approximately 30-pack-year history.  No regular alcohol use.  He is married.  Family history-mother had melanoma and also hypertension history.  His father had COPD.  Maternal grandmother with hypertension and heart disease history   Past Medical History:  Diagnosis Date  . Allergy   . Arthritis   . CAD (coronary artery disease) 6/14   Cardiac catheterization 6/27/ 2014 ejection fraction 35-40%, 30% proximal left circumflex, 100% tiny obtuse marginal 1 with collaterals, 50% LAD, 50% D1, 100% RCA with collaterals  . Chicken pox   . Chronic back pain   . Colon polyps   . Depression   . Diabetes mellitus ([place])    Diet controlled- OFF metformin 02-01-20  . GERD (gastroesophageal reflux disease)   . Hyperlipidemia   . Hypertension   . Kidney stone   . Myocardial infarction [facility])    ? year- cath 2014/ june     Past Surgical History:  Procedure Laterality Date  . COLONOSCOPY    . CRYO INTERCOSTAL NERVE BLOCK     double sacrum nerve block  . epidural injections     multiple- L5 S1 and  cervical neck pain issues as well   . POLYPECTOMY    . TONSILLECTOMY  1963  . TRIGGER FINGER RELEASE  2019    Family History  Problem Relation Age of Onset  . Melanoma Mother        metastatic  . Cancer Mother        melanoma  . Hypertension Mother   . Arthritis Father   . Colon polyps Father   . COPD Father   . Hyperlipidemia Maternal Grandmother   . Heart disease Maternal Grandmother   . Arthritis Paternal Grandmother   . Clotting disorder Paternal Grandmother   . Clotting disorder Paternal Grandfather   . Liver cancer Paternal Grandfather   . Colon cancer Neg Hx   . Esophageal cancer Neg Hx   . Rectal cancer Neg Hx   . Stomach cancer Neg Hx     Social History   Socioeconomic History  . Marital status: Married    Spouse name: Not on file  . Number of children: 3  . Years of education: Not on file  . Highest education level: Not on file  Occupational History  . Occupation: retired    Comment: Retired  Tobacco Use  . Smoking status: Former Smoker    Packs/day: 0.25    Years: 28.00    Pack years: 7.00    Start date: 21    Quit date: 2000    Years since quitting: 22.0  . Smokeless tobacco:  Never Used  Vaping Use  . Vaping Use: Never used  Substance and Sexual Activity  . Alcohol use: No    Alcohol/week: 0.0 standard drinks  . Drug use: No  . Sexual activity: Not on file  Other Topics Concern  . Not on file  Social History Narrative  . Not on file   Social Determinants of Health   Financial Resource Strain: Not on file  Food Insecurity: Not on file  Transportation Needs: Not on file  Physical Activity: Not on file  Stress: Not on file  Social Connections: Not on file  Intimate Partner Violence: Not on file    Outpatient Medications Prior to Visit  Medication Sig Dispense Refill  . acetaminophen (TYLENOL) 500 MG tablet Take 500 mg by mouth every 6 (six) hours as needed for moderate pain.    Marland Kitchen ALPRAZolam (XANAX) 0.25 MG tablet Take 0.25 mg by  mouth daily.    Marland Kitchen aspirin 81 MG tablet Take 81 mg by mouth daily.    Marland Kitchen atorvastatin (LIPITOR) 80 MG tablet TAKE 1 TABLET BY MOUTH EVERY DAY 90 tablet 1  . azelastine (ASTELIN) 0.1 % nasal spray PLACE 1 SPRAY INTO BOTH NOSTRILS 2 (TWO) TIMES DAILY AS DIRECTED 30 mL 1  . benzonatate (TESSALON) 200 MG capsule Take 1 capsule (200 mg total) by mouth 3 (three) times daily as needed for cough. 24 capsule 0  . Bismuth Tribromoph-Petrolatum (XEROFORM PETROLAT PATCH 4"X4") PADS USE AS DIRECTED TWICE A DAY EXTERNALLY 30 DAYS  4  . carvedilol (COREG) 12.5 MG tablet TAKE 1 TABLET (12.5 MG TOTAL) BY MOUTH 2 (TWO) TIMES DAILY WITH A MEAL. 180 tablet 3  . celecoxib (CELEBREX) 200 MG capsule Take 200 mg by mouth daily.     . clindamycin (CLEOCIN T) 1 % external solution     . clonazePAM (KLONOPIN) 0.5 MG tablet Take 0.5 mg by mouth at bedtime.     . diclofenac Sodium (VOLTAREN) 1 % GEL diclofenac 1 % topical gel    . ELIDEL 1 % cream APPLY TO AFFECTED AREA TWICE A DAY AS NEEDED FOR 14 DAYS  2  . escitalopram (LEXAPRO) 20 MG tablet Take 20 mg by mouth daily.    Marland Kitchen ezetimibe (ZETIA) 10 MG tablet TAKE 1 TABLET BY MOUTH EVERY DAY 90 tablet 1  . fluticasone (FLONASE) 50 MCG/ACT nasal spray USE 2 SPRAYS IN EACH NOSTRIL EVERY DAY 48 mL 2  . gabapentin (NEURONTIN) 600 MG tablet TAKE 1 TABLET BY MOUTH TWICE A DAY 180 tablet 1  . lisinopril (ZESTRIL) 20 MG tablet Take 1 tablet (20 mg total) by mouth daily. 90 tablet 3  . LOTEMAX 0.5 % GEL SMARTSIG:1 Drop(s) In Eye(s) Twice Daily PRN    . pantoprazole (PROTONIX) 40 MG tablet TAKE 1 TABLET BY MOUTH EVERY DAY 90 tablet 3  . predniSONE (DELTASONE) 10 MG tablet 40 mg x 3 days, 20 mg x 3 days, 10 mg x 3 days 21 tablet 0  . RESTASIS 0.05 % ophthalmic emulsion Place 1 drop into both eyes 2 (two) times daily.     . tacrolimus (PROTOPIC) 0.1 % ointment APPLY TO AFFECTED AREA ONCE A DAY    . tamsulosin (FLOMAX) 0.4 MG CAPS capsule TAKE 1 CAPSULE (0.4 MG TOTAL) BY MOUTH DAILY AFTER  SUPPER. 90 capsule 1  . triamcinolone (KENALOG) 0.1 % APPLY TOPICALLY TO THE AFFECTED AREA 2 (TWO) TIMES DAILY AS NEEDED. 80 g 1  . triamcinolone (KENALOG) 0.1 % APPLY TOPICALLY TO THE  AFFECTED AREA 2 (TWO) TIMES DAILY AS NEEDED. 30 g 1  . zolpidem (AMBIEN) 10 MG tablet Take 10 mg by mouth at bedtime as needed for sleep.     No facility-administered medications prior to visit.    Allergies  Allergen Reactions  . Latex Other (See Comments)    Other  . Tape     Use Paper Tape   . Sulfa Antibiotics Rash    ROS Review of Systems  Constitutional: Negative for activity change, appetite change, fatigue, fever and unexpected weight change.  HENT: Negative for congestion, ear pain and trouble swallowing.   Eyes: Negative for pain and visual disturbance.  Respiratory: Negative for cough, chest tightness, shortness of breath and wheezing.   Cardiovascular: Negative for chest pain, palpitations and leg swelling.  Gastrointestinal: Negative for abdominal distention, abdominal pain, blood in stool, constipation, diarrhea, nausea, rectal pain and vomiting.  Endocrine: Negative for polydipsia and polyuria.  Genitourinary: Negative for dysuria, hematuria and testicular pain.  Musculoskeletal: Positive for arthralgias. Negative for joint swelling.  Skin: Negative for rash.  Neurological: Negative for dizziness, syncope, weakness, light-headedness and headaches.  Hematological: Negative for adenopathy.  Psychiatric/Behavioral: Negative for confusion and dysphoric mood.      Objective:    Physical Exam Constitutional:      General: He is not in acute distress.    Appearance: He is well-developed and well-nourished.  HENT:     Head: Normocephalic and atraumatic.     Right Ear: External ear normal.     Left Ear: External ear normal.     Mouth/Throat:     Mouth: Oropharynx is clear and moist.  Eyes:     Extraocular Movements: EOM normal.     Conjunctiva/sclera: Conjunctivae normal.      Pupils: Pupils are equal, round, and reactive to light.  Neck:     Thyroid: No thyromegaly.  Cardiovascular:     Rate and Rhythm: Normal rate and regular rhythm.     Heart sounds: Normal heart sounds. No gallop.   Pulmonary:     Effort: No respiratory distress.     Breath sounds: No wheezing or rales.  Abdominal:     General: Bowel sounds are normal. There is no distension.     Palpations: Abdomen is soft. There is no mass.     Tenderness: There is no abdominal tenderness. There is no guarding or rebound.  Musculoskeletal:        General: No edema.     Cervical back: Normal range of motion and neck supple.     Right lower leg: No edema.     Left lower leg: No edema.  Lymphadenopathy:     Cervical: No cervical adenopathy.  Skin:    Findings: No rash.     Comments: Multiple scattered seborrheic keratoses.  No concerning lesions noted.  He sees dermatologist yearly.  Neurological:     Mental Status: He is alert and oriented to person, place, and time.     Cranial Nerves: No cranial nerve deficit.  Psychiatric:        Mood and Affect: Mood and affect normal.     BP 124/80 (BP Location: Right Arm, Patient Position: Sitting, Cuff Size: Normal)   Pulse 74   Temp 97.7 F (36.5 C) (Oral)   Resp 16   Ht 6\' 2"  (1.88 m)   Wt 212 lb (96.2 kg)   SpO2 95%   BMI 27.22 kg/m  Wt Readings from Last 3 Encounters:  03/30/20 212  lb (96.2 kg)  02/24/20 209 lb (94.8 kg)  02/03/20 220 lb (99.8 kg)     Health Maintenance Due  Topic Date Due  . FOOT EXAM  Never done  . COLONOSCOPY (Pts 45-58yrs Insurance coverage will need to be confirmed)  01/07/2020    There are no preventive care reminders to display for this patient.  Lab Results  Component Value Date   TSH 1.40 03/15/2019   Lab Results  Component Value Date   WBC 8.0 03/15/2019   HGB 13.1 03/15/2019   HCT 39.8 03/15/2019   MCV 94.3 03/15/2019   PLT 141.0 (L) 03/15/2019   Lab Results  Component Value Date   NA 139  11/01/2019   K 4.8 11/01/2019   CO2 33 (H) 11/01/2019   GLUCOSE 114 (H) 11/01/2019   BUN 15 11/01/2019   CREATININE 1.05 11/01/2019   BILITOT 0.6 11/01/2019   ALKPHOS 58 03/15/2019   AST 18 11/01/2019   ALT 16 11/01/2019   PROT 6.7 11/01/2019   ALBUMIN 4.4 03/15/2019   CALCIUM 9.7 11/01/2019   ANIONGAP 7 04/03/2014   GFR 73.57 03/15/2019   Lab Results  Component Value Date   CHOL 143 11/01/2019   Lab Results  Component Value Date   HDL 44 11/01/2019   Lab Results  Component Value Date   LDLCALC 77 11/01/2019   Lab Results  Component Value Date   TRIG 136 11/01/2019   Lab Results  Component Value Date   CHOLHDL 3.3 11/01/2019   Lab Results  Component Value Date   HGBA1C 6.2 (H) 11/01/2019      Assessment & Plan:   Problem List Items Addressed This Visit   None   Visit Diagnoses    Physical exam    -  Primary   Relevant Orders   Basic metabolic panel   Lipid panel   CBC with Differential/Platelet   TSH   Hepatic function panel   PSA   Hemoglobin A1c    -Continue annual flu vaccine -Recommend Prevnar 13 at age 29 -Colonoscopy already scheduled for February -Obtain screening lab work.  Include A1c with prior history of hyperglycemia.  No orders of the defined types were placed in this encounter.   Follow-up: No follow-ups on file.    Carolann Littler, MD

## 2020-03-30 NOTE — Patient Instructions (Signed)

## 2020-04-03 DIAGNOSIS — M25511 Pain in right shoulder: Secondary | ICD-10-CM | POA: Diagnosis not present

## 2020-04-03 DIAGNOSIS — M5136 Other intervertebral disc degeneration, lumbar region: Secondary | ICD-10-CM | POA: Diagnosis not present

## 2020-04-03 DIAGNOSIS — Z713 Dietary counseling and surveillance: Secondary | ICD-10-CM | POA: Diagnosis not present

## 2020-04-03 DIAGNOSIS — M50922 Unspecified cervical disc disorder at C5-C6 level: Secondary | ICD-10-CM | POA: Diagnosis not present

## 2020-04-03 DIAGNOSIS — M50923 Unspecified cervical disc disorder at C6-C7 level: Secondary | ICD-10-CM | POA: Diagnosis not present

## 2020-04-04 ENCOUNTER — Encounter: Payer: Self-pay | Admitting: Family Medicine

## 2020-04-04 DIAGNOSIS — L600 Ingrowing nail: Secondary | ICD-10-CM | POA: Diagnosis not present

## 2020-04-15 ENCOUNTER — Other Ambulatory Visit: Payer: Self-pay | Admitting: Family Medicine

## 2020-04-17 ENCOUNTER — Other Ambulatory Visit: Payer: Self-pay

## 2020-04-18 ENCOUNTER — Other Ambulatory Visit: Payer: Self-pay

## 2020-04-18 ENCOUNTER — Ambulatory Visit (INDEPENDENT_AMBULATORY_CARE_PROVIDER_SITE_OTHER): Payer: Medicare Other | Admitting: Family Medicine

## 2020-04-18 ENCOUNTER — Ambulatory Visit (AMBULATORY_SURGERY_CENTER): Payer: Medicare Other | Admitting: *Deleted

## 2020-04-18 ENCOUNTER — Encounter: Payer: Self-pay | Admitting: Family Medicine

## 2020-04-18 VITALS — Ht 74.0 in | Wt 215.0 lb

## 2020-04-18 VITALS — BP 108/60 | HR 54 | Ht 74.0 in | Wt 221.0 lb

## 2020-04-18 DIAGNOSIS — H938X2 Other specified disorders of left ear: Secondary | ICD-10-CM

## 2020-04-18 DIAGNOSIS — Z86010 Personal history of colonic polyps: Secondary | ICD-10-CM

## 2020-04-18 NOTE — Patient Instructions (Signed)
For fluid in the ear can try 50:50 mix of rubbing alcohol and white vinegar- just put in a few drops

## 2020-04-18 NOTE — Progress Notes (Signed)
Patient's pre-visit was done today over the phone with the patient due to COVID-19 pandemic. Name,DOB and address verified. Insurance verified. Patient denies any allergies to Eggs and Soy. Patient denies any problems with anesthesia/sedation. Patient denies taking diet pills or blood thinners. Packet of Prep instructions mailed to patient including a copy of a consent form and pre-procedure patient acknowledgement form (with envelope to mail back to us)-pt is aware. Patient understands to call us back with any questions or concerns. COVID-19 vaccines completed on  929/21 x3, per patient. Patient is aware of our care-partner policy and MBTDH-74 safety protocol.  Pt has suprep at home.

## 2020-04-18 NOTE — Progress Notes (Signed)
Established Patient Office Visit  Subjective:  Patient ID: Peter Snyder, male    DOB: 1956/03/10  Age: 65 y.o. MRN: [id number]  CC:  Chief Complaint  Patient presents with  . Ear Fullness    HPI [facility] presents for sensation of "left ear fullness".  He had similar sensations in the past.  He states his symptoms have actually improved past couple days.  He has noted recently with things like showering that he seems to have some retained fluid in his ear when he moves his head back and forth.  No hearing loss.  He has some chronic tinnitus which is unchanged.  No vertigo.  No ear drainage.  Past Medical History:  Diagnosis Date  . Allergy   . Arthritis   . CAD (coronary artery disease) 6/14   Cardiac catheterization 6/27/ 2014 ejection fraction 35-40%, 30% proximal left circumflex, 100% tiny obtuse marginal 1 with collaterals, 50% LAD, 50% D1, 100% RCA with collaterals  . Chicken pox   . Chronic back pain   . Colon polyps   . Depression   . Diabetes mellitus ([place])    Diet controlled- OFF metformin 02-01-20  . GERD (gastroesophageal reflux disease)   . Hyperlipidemia   . Hypertension   . Kidney stone   . Myocardial infarction [facility])    ? year- cath 2014/ june     Past Surgical History:  Procedure Laterality Date  . COLONOSCOPY    . CRYO INTERCOSTAL NERVE BLOCK     double sacrum nerve block  . epidural injections     multiple- L5 S1 and cervical neck pain issues as well   . POLYPECTOMY    . TONSILLECTOMY  1963  . TRIGGER FINGER RELEASE  2019    Family History  Problem Relation Age of Onset  . Melanoma Mother        metastatic  . Cancer Mother        melanoma  . Hypertension Mother   . Arthritis Father   . Colon polyps Father   . COPD Father   . Hyperlipidemia Maternal Grandmother   . Heart disease Maternal Grandmother   . Arthritis Paternal Grandmother   . Clotting disorder Paternal Grandmother   . Clotting disorder Paternal Grandfather   . Liver cancer  Paternal Grandfather   . Colon cancer Neg Hx   . Esophageal cancer Neg Hx   . Rectal cancer Neg Hx   . Stomach cancer Neg Hx     Social History   Socioeconomic History  . Marital status: Married    Spouse name: Not on file  . Number of children: 3  . Years of education: Not on file  . Highest education level: Not on file  Occupational History  . Occupation: retired    Comment: Retired  Tobacco Use  . Smoking status: Former Smoker    Packs/day: 0.25    Years: 28.00    Pack years: 7.00    Start date: 40    Quit date: 2000    Years since quitting: 22.1  . Smokeless tobacco: Never Used  Vaping Use  . Vaping Use: Never used  Substance and Sexual Activity  . Alcohol use: No    Alcohol/week: 0.0 standard drinks  . Drug use: No  . Sexual activity: Not on file  Other Topics Concern  . Not on file  Social History Narrative  . Not on file   Social Determinants of Health   Financial Resource Strain: Not on file  Food Insecurity: Not on file  Transportation Needs: Not on file  Physical Activity: Not on file  Stress: Not on file  Social Connections: Not on file  Intimate Partner Violence: Not on file    Outpatient Medications Prior to Visit  Medication Sig Dispense Refill  . acetaminophen (TYLENOL) 500 MG tablet Take 500 mg by mouth every 6 (six) hours as needed for moderate pain.    Marland Kitchen ALPRAZolam (XANAX) 0.25 MG tablet Take 0.25 mg by mouth daily.    Marland Kitchen aspirin 81 MG tablet Take 81 mg by mouth daily.    Marland Kitchen atorvastatin (LIPITOR) 80 MG tablet TAKE 1 TABLET BY MOUTH EVERY DAY 90 tablet 1  . azelastine (ASTELIN) 0.1 % nasal spray PLACE 1 SPRAY INTO BOTH NOSTRILS 2 (TWO) TIMES DAILY AS DIRECTED 30 mL 1  . benzonatate (TESSALON) 200 MG capsule Take 1 capsule (200 mg total) by mouth 3 (three) times daily as needed for cough. 24 capsule 0  . Bismuth Tribromoph-Petrolatum (XEROFORM PETROLAT PATCH 4"X4") PADS USE AS DIRECTED TWICE A DAY EXTERNALLY 30 DAYS  4  . carvedilol (COREG)  12.5 MG tablet TAKE 1 TABLET (12.5 MG TOTAL) BY MOUTH 2 (TWO) TIMES DAILY WITH A MEAL. 180 tablet 3  . celecoxib (CELEBREX) 200 MG capsule Take 200 mg by mouth daily.     . clindamycin (CLEOCIN T) 1 % external solution     . clonazePAM (KLONOPIN) 0.5 MG tablet Take 0.5 mg by mouth at bedtime.     . diclofenac Sodium (VOLTAREN) 1 % GEL diclofenac 1 % topical gel    . ELIDEL 1 % cream APPLY TO AFFECTED AREA TWICE A DAY AS NEEDED FOR 14 DAYS  2  . escitalopram (LEXAPRO) 20 MG tablet Take 20 mg by mouth daily.    Marland Kitchen ezetimibe (ZETIA) 10 MG tablet TAKE 1 TABLET BY MOUTH EVERY DAY 90 tablet 1  . fluticasone (FLONASE) 50 MCG/ACT nasal spray USE 2 SPRAYS IN EACH NOSTRIL EVERY DAY 48 mL 2  . gabapentin (NEURONTIN) 600 MG tablet TAKE 1 TABLET BY MOUTH TWICE A DAY 180 tablet 1  . lisinopril (ZESTRIL) 20 MG tablet Take 1 tablet (20 mg total) by mouth daily. 90 tablet 3  . LOTEMAX 0.5 % GEL SMARTSIG:1 Drop(s) In Eye(s) Twice Daily PRN    . pantoprazole (PROTONIX) 40 MG tablet TAKE 1 TABLET BY MOUTH TWICE A DAY 180 tablet 3  . predniSONE (DELTASONE) 10 MG tablet 40 mg x 3 days, 20 mg x 3 days, 10 mg x 3 days 21 tablet 0  . RESTASIS 0.05 % ophthalmic emulsion Place 1 drop into both eyes 2 (two) times daily.     . tacrolimus (PROTOPIC) 0.1 % ointment APPLY TO AFFECTED AREA ONCE A DAY    . tamsulosin (FLOMAX) 0.4 MG CAPS capsule TAKE 1 CAPSULE (0.4 MG TOTAL) BY MOUTH DAILY AFTER SUPPER. 90 capsule 1  . triamcinolone (KENALOG) 0.1 % APPLY TOPICALLY TO THE AFFECTED AREA 2 (TWO) TIMES DAILY AS NEEDED. 80 g 1  . triamcinolone (KENALOG) 0.1 % APPLY TOPICALLY TO THE AFFECTED AREA 2 (TWO) TIMES DAILY AS NEEDED. 30 g 1  . zolpidem (AMBIEN) 10 MG tablet Take 10 mg by mouth at bedtime as needed for sleep.     No facility-administered medications prior to visit.    Allergies  Allergen Reactions  . Latex Other (See Comments)    Other  . Tape     Use Paper Tape   . Sulfa Antibiotics Rash  ROS Review of Systems   Constitutional: Negative for chills and fever.  HENT: Negative for ear discharge, ear pain and hearing loss.       Objective:    Physical Exam Vitals reviewed.  Constitutional:      Appearance: Normal appearance.  HENT:     Ears:     Comments: Minimal cerumen in both canals.  Eardrums appear normal.  No effusion.  No evidence for eardrum trauma.  He has a little bit of dried blood left canal along the inferior portion.  This is very minimal. Cardiovascular:     Rate and Rhythm: Normal rate and regular rhythm.  Pulmonary:     Effort: Pulmonary effort is normal.     Breath sounds: Normal breath sounds.  Musculoskeletal:     Cervical back: Neck supple.  Lymphadenopathy:     Cervical: No cervical adenopathy.  Neurological:     Mental Status: He is alert.     BP 108/60   Pulse (!) 54   Ht 6\' 2"  (1.88 m)   Wt 221 lb (100.2 kg)   SpO2 99%   BMI 28.37 kg/m  Wt Readings from Last 3 Encounters:  04/18/20 221 lb (100.2 kg)  03/30/20 212 lb (96.2 kg)  02/24/20 209 lb (94.8 kg)     Health Maintenance Due  Topic Date Due  . COLONOSCOPY (Pts 45-42yrs Insurance coverage will need to be confirmed)  01/07/2020    There are no preventive care reminders to display for this patient.  Lab Results  Component Value Date   TSH 1.62 03/30/2020   Lab Results  Component Value Date   WBC 4.1 03/30/2020   HGB 13.0 03/30/2020   HCT 38.4 (L) 03/30/2020   MCV 90.6 03/30/2020   PLT 173.0 03/30/2020   Lab Results  Component Value Date   NA 140 03/30/2020   K 4.8 03/30/2020   CO2 31 03/30/2020   GLUCOSE 122 (H) 03/30/2020   BUN 15 03/30/2020   CREATININE 1.14 03/30/2020   BILITOT 0.5 03/30/2020   ALKPHOS 56 03/30/2020   AST 20 03/30/2020   ALT 20 03/30/2020   PROT 6.8 03/30/2020   ALBUMIN 4.8 03/30/2020   CALCIUM 9.6 03/30/2020   ANIONGAP 7 04/03/2014   GFR 67.79 03/30/2020   Lab Results  Component Value Date   CHOL 146 03/30/2020   Lab Results  Component Value Date    HDL 49.10 03/30/2020   Lab Results  Component Value Date   LDLCALC 72 03/30/2020   Lab Results  Component Value Date   TRIG 128.0 03/30/2020   Lab Results  Component Value Date   CHOLHDL 3 03/30/2020   Lab Results  Component Value Date   HGBA1C 6.5 03/30/2020      Assessment & Plan:   Sensation of left ear fullness.  Minimal cerumen.  Eardrum appears normal.  Symptoms currently resolved.  -Reassurance. -We suggest that if he has fluid retained in his ear after showering consider 50-50 solution of rubbing alcohol and white vinegar and place a few drops  No orders of the defined types were placed in this encounter.   Follow-up: No follow-ups on file.    Carolann Littler, MD

## 2020-04-20 ENCOUNTER — Ambulatory Visit (INDEPENDENT_AMBULATORY_CARE_PROVIDER_SITE_OTHER): Payer: Medicare Other | Admitting: Family Medicine

## 2020-04-20 ENCOUNTER — Other Ambulatory Visit: Payer: Self-pay

## 2020-04-20 ENCOUNTER — Encounter: Payer: Self-pay | Admitting: Family Medicine

## 2020-04-20 VITALS — BP 104/60 | HR 65 | Ht 74.0 in | Wt 220.0 lb

## 2020-04-20 DIAGNOSIS — L03317 Cellulitis of buttock: Secondary | ICD-10-CM

## 2020-04-20 MED ORDER — DOXYCYCLINE HYCLATE 100 MG PO TABS: 100.0000 mg | ORAL_TABLET | Freq: Two times a day (BID) | ORAL | 0 refills | Status: DC

## 2020-04-20 NOTE — Patient Instructions (Signed)
Do at least once or twice daily warm sitz baths   Start the Doxycycline  Follow up for any progressive redness, swelling, or fluctuance.

## 2020-04-20 NOTE — Progress Notes (Signed)
Established Patient Office Visit  Subjective:  Patient ID: Peter Snyder, male    DOB: Dec 29, 1955  Age: 65 y.o. MRN: [id number]  CC:  Chief Complaint  Patient presents with  . Mass    On buttocks    HPI Peter Snyder presents for sore area right buttock.  He Peter noticed this couple days ago.  He has applied some heat and has not noted any drainage.  This is not severe pain but some soreness.  No known history of MRSA.  No fevers or chills.  Past Medical History:  Diagnosis Date  . Allergy   . Arthritis   . CAD (coronary artery disease) 6/14   Cardiac catheterization 6/27/ 2014 ejection fraction 35-40%, 30% proximal left circumflex, 100% tiny obtuse marginal 1 with collaterals, 50% LAD, 50% D1, 100% RCA with collaterals  . Chicken pox   . Chronic back pain   . Colon polyps   . Depression   . Diabetes mellitus ([place])    Diet controlled- OFF metformin 02-01-20  . GERD (gastroesophageal reflux disease)   . Hyperlipidemia   . Hypertension   . Kidney stone   . Myocardial infarction [facility])    ? year- cath 2014/ june     Past Surgical History:  Procedure Laterality Date  . COLONOSCOPY    . CRYO INTERCOSTAL NERVE BLOCK     double sacrum nerve block  . epidural injections     multiple- L5 S1 and cervical neck pain issues as well   . POLYPECTOMY    . TONSILLECTOMY  1963  . TRIGGER FINGER RELEASE  2019    Family History  Problem Relation Age of Onset  . Melanoma Mother        metastatic  . Cancer Mother        melanoma  . Hypertension Mother   . Arthritis Father   . Colon polyps Father   . COPD Father   . Hyperlipidemia Maternal Grandmother   . Heart disease Maternal Grandmother   . Arthritis Paternal Grandmother   . Clotting disorder Paternal Grandmother   . Clotting disorder Paternal Grandfather   . Liver cancer Paternal Grandfather   . Colon cancer Neg Hx   . Esophageal cancer Neg Hx   . Rectal cancer Neg Hx   . Stomach cancer Neg Hx     Social History    Socioeconomic History  . Marital status: Married    Spouse name: Not on file  . Number of children: 3  . Years of education: Not on file  . Highest education level: Not on file  Occupational History  . Occupation: retired    Comment: Retired  Tobacco Use  . Smoking status: Former Smoker    Packs/day: 0.25    Years: 28.00    Pack years: 7.00    Start date: 27    Quit date: 2000    Years since quitting: 22.1  . Smokeless tobacco: Never Used  Vaping Use  . Vaping Use: Never used  Substance and Sexual Activity  . Alcohol use: No    Alcohol/week: 0.0 standard drinks  . Drug use: No  . Sexual activity: Not on file  Other Topics Concern  . Not on file  Social History Narrative  . Not on file   Social Determinants of Health   Financial Resource Strain: Not on file  Food Insecurity: Not on file  Transportation Needs: Not on file  Physical Activity: Not on file  Stress: Not on file  Social Connections: Not on file  Intimate Partner Violence: Not on file    Outpatient Medications Prior to Visit  Medication Sig Dispense Refill  . acetaminophen (TYLENOL) 500 MG tablet Take 500 mg by mouth every 6 (six) hours as needed for moderate pain.    Marland Kitchen ALPRAZolam (XANAX) 0.25 MG tablet Take 0.25 mg by mouth daily.    Marland Kitchen aspirin 81 MG tablet Take 81 mg by mouth daily.    Marland Kitchen atorvastatin (LIPITOR) 80 MG tablet TAKE 1 TABLET BY MOUTH EVERY DAY 90 tablet 1  . azelastine (ASTELIN) 0.1 % nasal spray PLACE 1 SPRAY INTO BOTH NOSTRILS 2 (TWO) TIMES DAILY AS DIRECTED 30 mL 1  . benzonatate (TESSALON) 200 MG capsule Take 1 capsule (200 mg total) by mouth 3 (three) times daily as needed for cough. 24 capsule 0  . Bismuth Tribromoph-Petrolatum (XEROFORM PETROLAT PATCH 4"X4") PADS USE AS DIRECTED TWICE A DAY EXTERNALLY 30 DAYS  4  . carvedilol (COREG) 12.5 MG tablet TAKE 1 TABLET (12.5 MG TOTAL) BY MOUTH 2 (TWO) TIMES DAILY WITH A MEAL. 180 tablet 3  . celecoxib (CELEBREX) 200 MG capsule Take 200 mg  by mouth daily.     . clindamycin (CLEOCIN T) 1 % external solution     . clonazePAM (KLONOPIN) 0.5 MG tablet Take 0.5 mg by mouth at bedtime.     . diclofenac Sodium (VOLTAREN) 1 % GEL diclofenac 1 % topical gel    . ELIDEL 1 % cream APPLY TO AFFECTED AREA TWICE A DAY AS NEEDED FOR 14 DAYS  2  . escitalopram (LEXAPRO) 20 MG tablet Take 20 mg by mouth daily.    Marland Kitchen ezetimibe (ZETIA) 10 MG tablet TAKE 1 TABLET BY MOUTH EVERY DAY 90 tablet 1  . fluticasone (FLONASE) 50 MCG/ACT nasal spray USE 2 SPRAYS IN EACH NOSTRIL EVERY DAY 48 mL 2  . gabapentin (NEURONTIN) 600 MG tablet TAKE 1 TABLET BY MOUTH TWICE A DAY 180 tablet 1  . lisinopril (ZESTRIL) 20 MG tablet Take 1 tablet (20 mg total) by mouth daily. 90 tablet 3  . LOTEMAX 0.5 % GEL SMARTSIG:1 Drop(s) In Eye(s) Twice Daily PRN    . pantoprazole (PROTONIX) 40 MG tablet TAKE 1 TABLET BY MOUTH TWICE A DAY 180 tablet 3  . predniSONE (DELTASONE) 10 MG tablet 40 mg x 3 days, 20 mg x 3 days, 10 mg x 3 days 21 tablet 0  . RESTASIS 0.05 % ophthalmic emulsion Place 1 drop into both eyes 2 (two) times daily.     . tacrolimus (PROTOPIC) 0.1 % ointment APPLY TO AFFECTED AREA ONCE A DAY    . tamsulosin (FLOMAX) 0.4 MG CAPS capsule TAKE 1 CAPSULE (0.4 MG TOTAL) BY MOUTH DAILY AFTER SUPPER. 90 capsule 1  . triamcinolone (KENALOG) 0.1 % APPLY TOPICALLY TO THE AFFECTED AREA 2 (TWO) TIMES DAILY AS NEEDED. 80 g 1  . triamcinolone (KENALOG) 0.1 % APPLY TOPICALLY TO THE AFFECTED AREA 2 (TWO) TIMES DAILY AS NEEDED. 30 g 1  . zolpidem (AMBIEN) 10 MG tablet Take 10 mg by mouth at bedtime as needed for sleep.     No facility-administered medications prior to visit.    Allergies  Allergen Reactions  . Latex Other (See Comments)    Other  . Tape     Use Paper Tape   . Sulfa Antibiotics Rash    ROS Review of Systems  Constitutional: Negative for chills and fever.      Objective:    Physical  Exam Vitals reviewed.  Constitutional:      Appearance: Normal  appearance.  Cardiovascular:     Rate and Rhythm: Normal rate and regular rhythm.  Pulmonary:     Effort: Pulmonary effort is normal.     Breath sounds: Normal breath sounds.  Skin:    Comments: Right buttock around middle portion of the buttock reveals an area of erythema approximately 1 cm diameter.  There is no pustule.  This is slightly indurated and slightly tender.  No fluctuance.  Neurological:     Mental Status: He is alert.     BP 104/60   Pulse 65   Ht 6\' 2"  (1.88 m)   Wt 220 lb (99.8 kg)   SpO2 96%   BMI 28.25 kg/m  Wt Readings from Last 3 Encounters:  04/20/20 220 lb (99.8 kg)  04/18/20 215 lb (97.5 kg)  04/18/20 221 lb (100.2 kg)     Health Maintenance Due  Topic Date Due  . COLONOSCOPY (Pts 45-42yrs Insurance coverage will need to be confirmed)  01/07/2020    There are no preventive care reminders to display for this patient.  Lab Results  Component Value Date   TSH 1.62 03/30/2020   Lab Results  Component Value Date   WBC 4.1 03/30/2020   HGB 13.0 03/30/2020   HCT 38.4 (L) 03/30/2020   MCV 90.6 03/30/2020   PLT 173.0 03/30/2020   Lab Results  Component Value Date   NA 140 03/30/2020   K 4.8 03/30/2020   CO2 31 03/30/2020   GLUCOSE 122 (H) 03/30/2020   BUN 15 03/30/2020   CREATININE 1.14 03/30/2020   BILITOT 0.5 03/30/2020   ALKPHOS 56 03/30/2020   AST 20 03/30/2020   ALT 20 03/30/2020   PROT 6.8 03/30/2020   ALBUMIN 4.8 03/30/2020   CALCIUM 9.6 03/30/2020   ANIONGAP 7 04/03/2014   GFR 67.79 03/30/2020   Lab Results  Component Value Date   CHOL 146 03/30/2020   Lab Results  Component Value Date   HDL 49.10 03/30/2020   Lab Results  Component Value Date   LDLCALC 72 03/30/2020   Lab Results  Component Value Date   TRIG 128.0 03/30/2020   Lab Results  Component Value Date   CHOLHDL 3 03/30/2020   Lab Results  Component Value Date   HGBA1C 6.5 03/30/2020      Assessment & Plan:   Question early abscess/cellulitis  right buttock.  No fluctuance.  -Recommend he apply heat with sitz bath or other moist heat at least two times daily and go ahead and start doxycycline 100 mg twice daily for staph coverage.  Be in touch if he notices any increased swelling, redness, fluctuance.  Hopefully this will resolve without surgical intervention (I and D)  Meds ordered this encounter  Medications  . doxycycline (VIBRA-TABS) 100 MG tablet    Sig: Take 1 tablet (100 mg total) by mouth 2 (two) times daily.    Dispense:  20 tablet    Refill:  0    Follow-up: No follow-ups on file.    Carolann Littler, MD

## 2020-04-23 ENCOUNTER — Encounter: Payer: Self-pay | Admitting: Family Medicine

## 2020-04-23 ENCOUNTER — Ambulatory Visit (INDEPENDENT_AMBULATORY_CARE_PROVIDER_SITE_OTHER): Payer: Medicare Other | Admitting: Family Medicine

## 2020-04-23 ENCOUNTER — Other Ambulatory Visit: Payer: Self-pay

## 2020-04-23 VITALS — BP 120/80 | HR 60 | Ht 74.0 in | Wt 214.0 lb

## 2020-04-23 DIAGNOSIS — L739 Follicular disorder, unspecified: Secondary | ICD-10-CM

## 2020-04-23 NOTE — Progress Notes (Signed)
Established Patient Office Visit  Subjective:  Patient ID: Peter Snyder, male    DOB: [birth year]  Age: 65 y.o. MRN: [id number]  CC:  Chief Complaint  Patient presents with  . Recurrent Skin Infections    HPI [facility] presents for follow-up regarding area of redness and pain right buttock.  He has evidence for some localized cellulitis but no fluctuance.  No pustules.  He has been doing more cycling indoors recently on stationary cycle with poor weather outside.  Normally does a lot of walking.  No fever.  He is taking doxycycline twice daily.  Has been applying some moist heat.  Still has some soreness but no difficulties with sitting or cycling  Past Medical History:  Diagnosis Date  . Allergy   . Arthritis   . CAD (coronary artery disease) 6/14   Cardiac catheterization 6/27/ 2014 ejection fraction 35-40%, 30% proximal left circumflex, 100% tiny obtuse marginal 1 with collaterals, 50% LAD, 50% D1, 100% RCA with collaterals  . Chicken pox   . Chronic back pain   . Colon polyps   . Depression   . Diabetes mellitus ([place])    Diet controlled- OFF metformin 02-01-20  . GERD (gastroesophageal reflux disease)   . Hyperlipidemia   . Hypertension   . Kidney stone   . Myocardial infarction [facility])    ? year- cath 2014/ june     Past Surgical History:  Procedure Laterality Date  . COLONOSCOPY    . CRYO INTERCOSTAL NERVE BLOCK     double sacrum nerve block  . epidural injections     multiple- L5 S1 and cervical neck pain issues as well   . POLYPECTOMY    . TONSILLECTOMY  1963  . TRIGGER FINGER RELEASE  2019    Family History  Problem Relation Age of Onset  . Melanoma Mother        metastatic  . Cancer Mother        melanoma  . Hypertension Mother   . Arthritis Father   . Colon polyps Father   . COPD Father   . Hyperlipidemia Maternal Grandmother   . Heart disease Maternal Grandmother   . Arthritis Paternal Grandmother   . Clotting disorder Paternal Grandmother   .  Clotting disorder Paternal Grandfather   . Liver cancer Paternal Grandfather   . Colon cancer Neg Hx   . Esophageal cancer Neg Hx   . Rectal cancer Neg Hx   . Stomach cancer Neg Hx     Social History   Socioeconomic History  . Marital status: Married    Spouse name: Not on file  . Number of children: 3  . Years of education: Not on file  . Highest education level: Not on file  Occupational History  . Occupation: retired    Comment: Retired  Tobacco Use  . Smoking status: Former Smoker    Packs/day: 0.25    Years: 28.00    Pack years: 7.00    Start date: 63    Quit date: 2000    Years since quitting: 22.1  . Smokeless tobacco: Never Used  Vaping Use  . Vaping Use: Never used  Substance and Sexual Activity  . Alcohol use: No    Alcohol/week: 0.0 standard drinks  . Drug use: No  . Sexual activity: Not on file  Other Topics Concern  . Not on file  Social History Narrative  . Not on file   Social Determinants of Health   Financial Resource  Strain: Not on file  Food Insecurity: Not on file  Transportation Needs: Not on file  Physical Activity: Not on file  Stress: Not on file  Social Connections: Not on file  Intimate Partner Violence: Not on file    Outpatient Medications Prior to Visit  Medication Sig Dispense Refill  . acetaminophen (TYLENOL) 500 MG tablet Take 500 mg by mouth every 6 (six) hours as needed for moderate pain.    Marland Kitchen ALPRAZolam (XANAX) 0.25 MG tablet Take 0.25 mg by mouth daily.    Marland Kitchen aspirin 81 MG tablet Take 81 mg by mouth daily.    Marland Kitchen atorvastatin (LIPITOR) 80 MG tablet TAKE 1 TABLET BY MOUTH EVERY DAY 90 tablet 1  . azelastine (ASTELIN) 0.1 % nasal spray PLACE 1 SPRAY INTO BOTH NOSTRILS 2 (TWO) TIMES DAILY AS DIRECTED 30 mL 1  . benzonatate (TESSALON) 200 MG capsule Take 1 capsule (200 mg total) by mouth 3 (three) times daily as needed for cough. 24 capsule 0  . Bismuth Tribromoph-Petrolatum (XEROFORM PETROLAT PATCH 4"X4") PADS USE AS DIRECTED  TWICE A DAY EXTERNALLY 30 DAYS  4  . carvedilol (COREG) 12.5 MG tablet TAKE 1 TABLET (12.5 MG TOTAL) BY MOUTH 2 (TWO) TIMES DAILY WITH A MEAL. 180 tablet 3  . celecoxib (CELEBREX) 200 MG capsule Take 200 mg by mouth daily.     . clindamycin (CLEOCIN T) 1 % external solution     . clonazePAM (KLONOPIN) 0.5 MG tablet Take 0.5 mg by mouth at bedtime.     . diclofenac Sodium (VOLTAREN) 1 % GEL diclofenac 1 % topical gel    . doxycycline (VIBRA-TABS) 100 MG tablet Take 1 tablet (100 mg total) by mouth 2 (two) times daily. 20 tablet 0  . ELIDEL 1 % cream APPLY TO AFFECTED AREA TWICE A DAY AS NEEDED FOR 14 DAYS  2  . escitalopram (LEXAPRO) 20 MG tablet Take 20 mg by mouth daily.    Marland Kitchen ezetimibe (ZETIA) 10 MG tablet TAKE 1 TABLET BY MOUTH EVERY DAY 90 tablet 1  . fluticasone (FLONASE) 50 MCG/ACT nasal spray USE 2 SPRAYS IN EACH NOSTRIL EVERY DAY 48 mL 2  . gabapentin (NEURONTIN) 600 MG tablet TAKE 1 TABLET BY MOUTH TWICE A DAY 180 tablet 1  . lisinopril (ZESTRIL) 20 MG tablet Take 1 tablet (20 mg total) by mouth daily. 90 tablet 3  . LOTEMAX 0.5 % GEL SMARTSIG:1 Drop(s) In Eye(s) Twice Daily PRN    . pantoprazole (PROTONIX) 40 MG tablet TAKE 1 TABLET BY MOUTH TWICE A DAY 180 tablet 3  . predniSONE (DELTASONE) 10 MG tablet 40 mg x 3 days, 20 mg x 3 days, 10 mg x 3 days 21 tablet 0  . RESTASIS 0.05 % ophthalmic emulsion Place 1 drop into both eyes 2 (two) times daily.     . tacrolimus (PROTOPIC) 0.1 % ointment APPLY TO AFFECTED AREA ONCE A DAY    . tamsulosin (FLOMAX) 0.4 MG CAPS capsule TAKE 1 CAPSULE (0.4 MG TOTAL) BY MOUTH DAILY AFTER SUPPER. 90 capsule 1  . triamcinolone (KENALOG) 0.1 % APPLY TOPICALLY TO THE AFFECTED AREA 2 (TWO) TIMES DAILY AS NEEDED. 80 g 1  . triamcinolone (KENALOG) 0.1 % APPLY TOPICALLY TO THE AFFECTED AREA 2 (TWO) TIMES DAILY AS NEEDED. 30 g 1  . zolpidem (AMBIEN) 10 MG tablet Take 10 mg by mouth at bedtime as needed for sleep.     No facility-administered medications prior to  visit.    Allergies  Allergen  Reactions  . Latex Other (See Comments)    Other  . Tape     Use Paper Tape   . Sulfa Antibiotics Rash    ROS Review of Systems  Constitutional: Negative for chills and fever.      Objective:    Physical Exam Vitals reviewed.  Constitutional:      Appearance: Normal appearance.  Cardiovascular:     Rate and Rhythm: Normal rate and regular rhythm.  Skin:    Comments: Very small area of erythema right buttock which is smaller in size than it was last week and less erythematous.  This is only about 6 to 8 mm diameter.  No fluctuance.  No pustules.  Neurological:     Mental Status: He is alert.     BP 120/80   Pulse 60   Ht 6\' 2"  (1.88 m)   Wt 214 lb (97.1 kg)   SpO2 98%   BMI 27.48 kg/m  Wt Readings from Last 3 Encounters:  04/23/20 214 lb (97.1 kg)  04/20/20 220 lb (99.8 kg)  04/18/20 215 lb (97.5 kg)     Health Maintenance Due  Topic Date Due  . COLONOSCOPY (Pts 45-70yrs Insurance coverage will need to be confirmed)  01/07/2020    There are no preventive care reminders to display for this patient.  Lab Results  Component Value Date   TSH 1.62 03/30/2020   Lab Results  Component Value Date   WBC 4.1 03/30/2020   HGB 13.0 03/30/2020   HCT 38.4 (L) 03/30/2020   MCV 90.6 03/30/2020   PLT 173.0 03/30/2020   Lab Results  Component Value Date   NA 140 03/30/2020   K 4.8 03/30/2020   CO2 31 03/30/2020   GLUCOSE 122 (H) 03/30/2020   BUN 15 03/30/2020   CREATININE 1.14 03/30/2020   BILITOT 0.5 03/30/2020   ALKPHOS 56 03/30/2020   AST 20 03/30/2020   ALT 20 03/30/2020   PROT 6.8 03/30/2020   ALBUMIN 4.8 03/30/2020   CALCIUM 9.6 03/30/2020   ANIONGAP 7 04/03/2014   GFR 67.79 03/30/2020   Lab Results  Component Value Date   CHOL 146 03/30/2020   Lab Results  Component Value Date   HDL 49.10 03/30/2020   Lab Results  Component Value Date   LDLCALC 72 03/30/2020   Lab Results  Component Value Date   TRIG  128.0 03/30/2020   Lab Results  Component Value Date   CHOLHDL 3 03/30/2020   Lab Results  Component Value Date   HGBA1C 6.5 03/30/2020      Assessment & Plan:   Small area of cellulitis right buttock improving on doxycycline.  No evidence for fluctuant abscess at this time.  Very minimal underlying induration.  This appears to be improving.  -Finish out doxycycline and continue warm sitz baths or moist heat application 2-3 times daily.  Follow-up as needed.  No orders of the defined types were placed in this encounter.   Follow-up: No follow-ups on file.    Carolann Littler, MD

## 2020-04-23 NOTE — Patient Instructions (Signed)
The left buttock area looks like it is improving- less redness and swelling  Finish out the Doxycycline and continue with warm/moist heat.

## 2020-04-25 ENCOUNTER — Ambulatory Visit: Payer: Medicare Other | Admitting: Family Medicine

## 2020-04-27 ENCOUNTER — Other Ambulatory Visit: Payer: Self-pay | Admitting: Family Medicine

## 2020-05-05 ENCOUNTER — Other Ambulatory Visit: Payer: Self-pay | Admitting: Family Medicine

## 2020-05-07 ENCOUNTER — Other Ambulatory Visit: Payer: Self-pay

## 2020-05-07 ENCOUNTER — Encounter: Payer: Self-pay | Admitting: Gastroenterology

## 2020-05-07 ENCOUNTER — Ambulatory Visit (AMBULATORY_SURGERY_CENTER): Payer: Medicare Other | Admitting: Gastroenterology

## 2020-05-07 VITALS — BP 124/68 | HR 58 | Temp 97.1°F | Resp 17 | Ht 74.0 in | Wt 215.0 lb

## 2020-05-07 DIAGNOSIS — Z860100 Personal history of colon polyps, unspecified: Secondary | ICD-10-CM

## 2020-05-07 DIAGNOSIS — D123 Benign neoplasm of transverse colon: Secondary | ICD-10-CM

## 2020-05-07 DIAGNOSIS — D122 Benign neoplasm of ascending colon: Secondary | ICD-10-CM

## 2020-05-07 DIAGNOSIS — D125 Benign neoplasm of sigmoid colon: Secondary | ICD-10-CM

## 2020-05-07 MED ORDER — SODIUM CHLORIDE 0.9 % IV SOLN
500.0000 mL | Freq: Once | INTRAVENOUS | Status: DC
Start: 2020-05-07 — End: 2020-05-07

## 2020-05-07 NOTE — Progress Notes (Signed)
Pt's states no medical or surgical changes since previsit or office visit.  VS SB

## 2020-05-07 NOTE — Progress Notes (Signed)
Called to room to assist during endoscopic procedure.  Patient ID and intended procedure confirmed with present staff. Received instructions for my participation in the procedure from the performing physician.  

## 2020-05-07 NOTE — Progress Notes (Signed)
Report given to PACU, vss 

## 2020-05-07 NOTE — Op Note (Signed)
[place] Patient Name: [facility] Procedure Date: 05/07/2020 8:48 AM MRN: [id number] Endoscopist: Perry. Loletha Carrow , MD Age: 65 Referring MD:  Date of Birth: [birth year] Gender: Male Account #: [id number] Procedure:                Colonoscopy Indications:              Surveillance: Personal history of adenomatous                            polyps on last colonoscopy > 3 years ago (< 10 mm                            TA and SSP 12/2016; 81mm TA 2013) Medicines:                Monitored Anesthesia Care Procedure:                Pre-Anesthesia Assessment:                           - Prior to the procedure, a History and Physical                            was performed, and patient medications and                            allergies were reviewed. The patient's tolerance of                            previous anesthesia was also reviewed. The risks                            and benefits of the procedure and the sedation                            options and risks were discussed with the patient.                            All questions were answered, and informed consent                            was obtained. Prior Anticoagulants: The patient has                            taken no previous anticoagulant or antiplatelet                            agents. ASA Grade Assessment: III - A patient with                            severe systemic disease. After reviewing the risks                            and benefits, the patient was deemed in  satisfactory condition to undergo the procedure.                           After obtaining informed consent, the colonoscope                            was passed under direct vision. Throughout the                            procedure, the patient's blood pressure, pulse, and                            oxygen saturations were monitored continuously. The                            Olympus CF-HQ190L (Serial#  [id number]) Colonoscope was                            introduced through the anus and advanced to the the                            terminal ileum, with identification of the                            appendiceal orifice and IC valve. The colonoscopy                            was performed without difficulty. The patient                            tolerated the procedure well. The quality of the                            bowel preparation was good. The terminal ileum,                            ileocecal valve, appendiceal orifice, and rectum                            were photographed. The bowel preparation used was                            SUPREP. Scope In: 9:08:44 AM Scope Out: 9:30:26 AM Scope Withdrawal Time: 0 hours 19 minutes 40 seconds  Total Procedure Duration: 0 hours 21 minutes 42 seconds  Findings:                 The perianal and digital rectal examinations were                            normal.                           A diminutive polyp was found in the proximal  ascending colon. The polyp was sessile. The polyp                            was removed with a cold snare. Resection and                            retrieval were complete.                           Three semi-sessile polyps were found in the distal                            transverse colon. The polyps were 3 to 5 mm in                            size. These polyps were removed with a cold snare.                            Resection was complete, but the polyp tissue was                            only partially retrieved.                           A 3 mm polyp was found in the recto-sigmoid colon.                            The polyp was sessile. The polyp was removed with a                            cold snare. Resection and retrieval were complete.                           The exam was otherwise without abnormality on                            direct and retroflexion  views. Complications:            No immediate complications. Estimated Blood Loss:     Estimated blood loss was minimal. Impression:               - One diminutive polyp in the proximal ascending                            colon, removed with a cold snare. Resected and                            retrieved.                           - Three 3 to 5 mm polyps in the distal transverse                            colon, removed with a cold snare.  Complete                            resection. Partial retrieval.                           - One 3 mm polyp at the recto-sigmoid colon,                            removed with a cold snare. Resected and retrieved.                           - The examination was otherwise normal on direct                            and retroflexion views. Recommendation:           - Patient has a contact number available for                            emergencies. The signs and symptoms of potential                            delayed complications were discussed with the                            patient. Return to normal activities tomorrow.                            Written discharge instructions were provided to the                            patient.                           - Resume previous diet.                           - Continue present medications.                           - Await pathology results.                           - Repeat colonoscopy is recommended for                            surveillance. The colonoscopy date will be                            determined after pathology results from today's                            exam become available for review. Deneise Getty L. Loletha Carrow, MD 05/07/2020 9:35:52 AM This report has been signed electronically.

## 2020-05-07 NOTE — Patient Instructions (Signed)
Impression/Recommendations:  Polyp handout given to patient.  Resume previous diet. Continue present medications. Await pathology results.  Repeat colonoscopy recommended for surveillance.  Date to be determined after pathology results reviewed.  YOU HAD AN ENDOSCOPIC PROCEDURE TODAY AT THE [facility]:   Refer to the procedure report that was given to you for any specific questions about what was found during the examination.  If the procedure report does not answer your questions, please call your gastroenterologist to clarify.  If you requested that your care partner not be given the details of your procedure findings, then the procedure report has been included in a sealed envelope for you to review at your convenience later.  YOU SHOULD EXPECT: Some feelings of bloating in the abdomen. Passage of more gas than usual.  Walking can help get rid of the air that was put into your GI tract during the procedure and reduce the bloating. If you had a lower endoscopy (such as a colonoscopy or flexible sigmoidoscopy) you may notice spotting of blood in your stool or on the toilet paper. If you underwent a bowel prep for your procedure, you may not have a normal bowel movement for a few days.  Please Note:  You might notice some irritation and congestion in your nose or some drainage.  This is from the oxygen used during your procedure.  There is no need for concern and it should clear up in a day or so.  SYMPTOMS TO REPORT IMMEDIATELY:  Following lower endoscopy (colonoscopy or flexible sigmoidoscopy):  Excessive amounts of blood in the stool  Significant tenderness or worsening of abdominal pains  Swelling of the abdomen that is new, acute  Fever of 100F or higher For urgent or emergent issues, a gastroenterologist can be reached at any hour by calling [phone]. Do not use MyChart messaging for urgent concerns.    DIET:  We do recommend a small meal at first, but then you  may proceed to your regular diet.  Drink plenty of fluids but you should avoid alcoholic beverages for 24 hours.  ACTIVITY:  You should plan to take it easy for the rest of today and you should NOT DRIVE or use heavy machinery until tomorrow (because of the sedation medicines used during the test).    FOLLOW UP: Our staff will call the number listed on your records 48-72 hours following your procedure to check on you and address any questions or concerns that you may have regarding the information given to you following your procedure. If we do not reach you, we will leave a message.  We will attempt to reach you two times.  During this call, we will ask if you have developed any symptoms of COVID 19. If you develop any symptoms (ie: fever, flu-like symptoms, shortness of breath, cough etc.) before then, please call [phone].  If you test positive for Covid 19 in the 2 weeks post procedure, please call and report this information to us.    If any biopsies were taken you will be contacted by phone or by letter within the next 1-3 weeks.  Please call us at [phone] if you have not heard about the biopsies in 3 weeks.    SIGNATURES/CONFIDENTIALITY: You and/or your care partner have signed paperwork which will be entered into your electronic medical record.  These signatures attest to the fact that that the information above on your After Visit Summary has been reviewed and is understood.  Full responsibility of   of the confidentiality of this discharge information lies with you and/or your care-partner.

## 2020-05-09 ENCOUNTER — Telehealth: Payer: Self-pay | Admitting: *Deleted

## 2020-05-09 NOTE — Telephone Encounter (Signed)
  Follow up Call-  Call back number 05/07/2020  Post procedure Call Back phone  # [phone]  Permission to leave phone message Yes  Some recent data might be hidden     Patient questions:  Do you have a fever, pain , or abdominal swelling? No. Pain Score  0 *  Have you tolerated food without any problems? Yes.    Have you been able to return to your normal activities? Yes.    Do you have any questions about your discharge instructions: Diet   No. Medications  No. Follow up visit  No.  Do you have questions or concerns about your Care? No.  Actions: * If pain score is 4 or above: No action needed, pain <4.  1. Have you developed a fever since your procedure? no  2.   Have you had an respiratory symptoms (SOB or cough) since your procedure? no  3.   Have you tested positive for COVID 19 since your procedure no  4.   Have you had any family members/close contacts diagnosed with the COVID 19 since your procedure?  no   If yes to any of these questions please route to Joylene John, RN and Joella Prince, RN

## 2020-05-09 NOTE — Telephone Encounter (Signed)
  Follow up Call-  Call back number 05/07/2020  Post procedure Call Back phone  # [phone]  Permission to leave phone message Yes  Some recent data might be hidden     Patient questions:  Message left to call us if necessary.

## 2020-05-11 ENCOUNTER — Encounter: Payer: BC Managed Care – PPO | Admitting: Gastroenterology

## 2020-05-16 ENCOUNTER — Encounter: Payer: Self-pay | Admitting: Gastroenterology

## 2020-05-20 ENCOUNTER — Other Ambulatory Visit: Payer: Self-pay

## 2020-05-20 ENCOUNTER — Ambulatory Visit
Admission: RE | Admit: 2020-05-20 | Discharge: 2020-05-20 | Disposition: A | Discharge status: Home / Self Care | Payer: Medicare Other | Source: Ambulatory Visit | Attending: Cardiology | Admitting: Cardiology

## 2020-05-20 DIAGNOSIS — I712 Thoracic aortic aneurysm, without rupture, unspecified: Secondary | ICD-10-CM

## 2020-05-20 MED ORDER — GADOBENATE DIMEGLUMINE 529 MG/ML IV SOLN
20.0000 mL | Freq: Once | INTRAVENOUS | Status: AC | PRN
  Administered 2020-05-20: 20 mL via INTRAVENOUS

## 2020-05-21 ENCOUNTER — Other Ambulatory Visit: Payer: Self-pay | Admitting: Family Medicine

## 2020-05-24 ENCOUNTER — Other Ambulatory Visit: Payer: Self-pay

## 2020-05-25 ENCOUNTER — Ambulatory Visit (INDEPENDENT_AMBULATORY_CARE_PROVIDER_SITE_OTHER): Payer: Medicare Other | Admitting: Family Medicine

## 2020-05-25 ENCOUNTER — Encounter: Payer: Self-pay | Admitting: Family Medicine

## 2020-05-25 VITALS — BP 108/60 | HR 63 | Temp 97.5°F | Ht 74.0 in | Wt 222.0 lb

## 2020-05-25 DIAGNOSIS — Z0000 Encounter for general adult medical examination without abnormal findings: Secondary | ICD-10-CM

## 2020-05-25 DIAGNOSIS — Z23 Encounter for immunization: Secondary | ICD-10-CM

## 2020-05-25 NOTE — Progress Notes (Signed)
Per orders of Dr. Elease Hashimoto  injection of Pneumovax 23 given in left deltoid by Sandford Craze.RN  Patient tolerated injection well.

## 2020-05-25 NOTE — Patient Instructions (Signed)
Preventive Care 65 Years and Older, Male Preventive care refers to lifestyle choices and visits with your health care provider that can promote health and wellness. This includes:  A yearly physical exam. This is also called an annual wellness visit.  Regular dental and eye exams.  Immunizations.  Screening for certain conditions.  Healthy lifestyle choices, such as: ? Eating a healthy diet. ? Getting regular exercise. ? Not using drugs or products that contain nicotine and tobacco. ? Limiting alcohol use. What can I expect for my preventive care visit? Physical exam Your health care provider will check your:  Height and weight. These may be used to calculate your BMI (body mass index). BMI is a measurement that tells if you are at a healthy weight.  Heart rate and blood pressure.  Body temperature.  Skin for abnormal spots. Counseling Your health care provider may ask you questions about your:  Past medical problems.  Family's medical history.  Alcohol, tobacco, and drug use.  Emotional well-being.  Home life and relationship well-being.  Sexual activity.  Diet, exercise, and sleep habits.  History of falls.  Memory and ability to understand (cognition).  Work and work environment.  Access to firearms. What immunizations do I need? Vaccines are usually given at various ages, according to a schedule. Your health care provider will recommend vaccines for you based on your age, medical history, and lifestyle or other factors, such as travel or where you work.   What tests do I need? Blood tests  Lipid and cholesterol levels. These may be checked every 5 years, or more often depending on your overall health.  Hepatitis C test.  Hepatitis B test. Screening  Lung cancer screening. You may have this screening every year starting at age 55 if you have a 30-pack-year history of smoking and currently smoke or have quit within the past 15 years.  Colorectal  cancer screening. ? All adults should have this screening starting at age 50 and continuing until age 75. ? Your health care provider may recommend screening at age 45 if you are at increased risk. ? You will have tests every 1-10 years, depending on your results and the type of screening test.  Prostate cancer screening. Recommendations will vary depending on your family history and other risks.  Genital exam to check for testicular cancer or hernias.  Diabetes screening. ? This is done by checking your blood sugar (glucose) after you have not eaten for a while (fasting). ? You may have this done every 1-3 years.  Abdominal aortic aneurysm (AAA) screening. You may need this if you are a current or former smoker.  STD (sexually transmitted disease) testing, if you are at risk. Follow these instructions at home: Eating and drinking  Eat a diet that includes fresh fruits and vegetables, whole grains, lean protein, and low-fat dairy products. Limit your intake of foods with high amounts of sugar, saturated fats, and salt.  Take vitamin and mineral supplements as recommended by your health care provider.  Do not drink alcohol if your health care provider tells you not to drink.  If you drink alcohol: ? Limit how much you have to 0-2 drinks a day. ? Be aware of how much alcohol is in your drink. In the [place] one drink equals one 12 oz bottle of beer (355 mL), one 5 oz glass of wine (148 mL), or one 1 oz glass of hard liquor (44 mL).   Lifestyle  Take daily care of your teeth   and gums. Brush your teeth every morning and night with fluoride toothpaste. Floss one time each day.  Stay active. Exercise for at least 30 minutes 5 or more days each week.  Do not use any products that contain nicotine or tobacco, such as cigarettes, e-cigarettes, and chewing tobacco. If you need help quitting, ask your health care provider.  Do not use drugs.  If you are sexually active, practice safe sex.  Use a condom or other form of protection to prevent STIs (sexually transmitted infections).  Talk with your health care provider about taking a low-dose aspirin or statin.  Find healthy ways to cope with stress, such as: ? Meditation, yoga, or listening to music. ? Journaling. ? Talking to a trusted person. ? Spending time with friends and family. Safety  Always wear your seat belt while driving or riding in a vehicle.  Do not drive: ? If you have been drinking alcohol. Do not ride with someone who has been drinking. ? When you are tired or distracted. ? While texting.  Wear a helmet and other protective equipment during sports activities.  If you have firearms in your house, make sure you follow all gun safety procedures. What's next?  Visit your health care provider once a year for an annual wellness visit.  Ask your health care provider how often you should have your eyes and teeth checked.  Stay up to date on all vaccines. This information is not intended to replace advice given to you by your health care provider. Make sure you discuss any questions you have with your health care provider. Document Revised: 11/30/2018 Document Reviewed: 02/25/2018 Elsevier Patient Education  2021 Elsevier Inc.  

## 2020-05-25 NOTE — Progress Notes (Signed)
Established Patient Office Visit  Subjective:  Patient ID: Peter Snyder, male    DOB: 1955/06/04  Age: 65 y.o. MRN: [id number]  CC: No chief complaint on file.   HPI Peter Snyder presents for [facility] [employer] visit.  He does had a recent complete physical.  Past medical history reviewed as below.  He has history of CAD, hyperglycemia, hyperlipidemia, hypertension, history of kidney stones, history of chronic anxiety.  He is followed by psychiatry for his chronic anxiety.  He takes Lexapro 10 mg daily and supplements with occasional Xanax as needed.  He had recent colonoscopy which revealed 2 adenomatous polyps and one hyperplastic polyp.  Surgical history-negative with exception of prior tonsillectomy.  1.  Risk factors based on Past Medical , Social, and Family history reviewed and as indicated above with no changes See above  Peter Snyder is married and this is his second marriage.  He worked in Neurosurgeon for several years.  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  Quit smoking 2000.  No regular alcohol use.  Family history-mother had melanoma.  Mom also had hypertension.  His father has a history of COPD was a heavy smoker.   2.  Limitations in physical activities None.  No recent falls. Walks 3 to 4 miles per day.  Good balance.  Very low risk for falls.  3.  Depression/mood No active depression or anxiety issues PHQ 2 equals 0   4.  Hearing No deficits  5.  ADLs independent in all.  6.  Cognitive function (orientation to time and place, language, writing, speech,memory) no short or long term memory issues.  Language and judgement intact.  7.  Home Safety no issues  8.  Height, weight, and visual acuity.all stable. Wt Readings from Last 3 Encounters:  05/25/20 222 lb (100.7 kg)  05/07/20 215 lb (97.5 kg)  04/23/20 214 lb (97.1 kg)  He does see ophthalmologist yearly. Vision screen per nurse today in office  9.  Counseling discussed Counseled  regarding age and gender appropriate preventative screenings and immunizations.  10. Recommendation of preventive services.  Needs Prevnar 13.  Will recommend Pneumovax in 1 year.  Other vaccines up-to-date.  Colonoscopy up-to-date.  11. Labs based on risk factors-none indicated at this time.  He had recent physical and multiple labs done then  12. Care Plan-as below  13. Other Providers-Dr. Loletha Snyder, GI Dr. Stanford Snyder, cardiology      14. Written schedule of screening/prevention services given to patient.  15 advance directives-he and his wife have living will along with full set of advanced directives in place  Health Maintenance  Topic Date Due  . PNA vac Low Risk Adult (2 of 2 - PPSV23) 05/12/2020  . OPHTHALMOLOGY EXAM  07/28/2020  . HEMOGLOBIN A1C  09/27/2020  . FOOT EXAM  03/30/2021  . TETANUS/TDAP  03/14/2024  . COLONOSCOPY (Pts 45-61yrs Insurance coverage will need to be confirmed)  05/07/2025  . INFLUENZA VACCINE  Completed  . COVID-19 Vaccine  Completed  . Hepatitis C Screening  Completed  . HIV Screening  Completed  . HPV VACCINES  Aged Out      Past Medical History:  Diagnosis Date  . Allergy   . Arthritis   . CAD (coronary artery disease) 6/14   Cardiac catheterization 6/27/ 2014 ejection fraction 35-40%, 30% proximal left circumflex, 100% tiny obtuse marginal 1 with collaterals, 50% LAD, 50% D1, 100% RCA with collaterals  . Chicken pox   . Chronic back pain   .  Colon polyps   . Depression   . Diabetes mellitus ([place])    Diet controlled- OFF metformin 02-01-20  . GERD (gastroesophageal reflux disease)   . Hyperlipidemia   . Hypertension   . Kidney stone   . Myocardial infarction [facility])    ? year- cath 2014/ june     Past Surgical History:  Procedure Laterality Date  . COLONOSCOPY    . CRYO INTERCOSTAL NERVE BLOCK     double sacrum nerve block  . epidural injections     multiple- L5 S1 and cervical neck pain issues as well   . POLYPECTOMY    .  TONSILLECTOMY  1963  . TRIGGER FINGER RELEASE  2019    Family History  Problem Relation Age of Onset  . Melanoma Mother        metastatic  . Cancer Mother        melanoma  . Hypertension Mother   . Arthritis Father   . Colon polyps Father   . COPD Father   . Hyperlipidemia Maternal Grandmother   . Heart disease Maternal Grandmother   . Arthritis Paternal Grandmother   . Clotting disorder Paternal Grandmother   . Clotting disorder Paternal Grandfather   . Liver cancer Paternal Grandfather   . Colon cancer Neg Hx   . Esophageal cancer Neg Hx   . Rectal cancer Neg Hx   . Stomach cancer Neg Hx     Social History   Socioeconomic History  . Marital status: Married    Spouse name: Not on file  . Number of children: 3  . Years of education: Not on file  . Highest education level: Not on file  Occupational History  . Occupation: retired    Comment: Retired  Tobacco Use  . Smoking status: Former Smoker    Packs/day: 0.25    Years: 28.00    Pack years: 7.00    Start date: 57    Quit date: 2000    Years since quitting: 22.2  . Smokeless tobacco: Never Used  Vaping Use  . Vaping Use: Never used  Substance and Sexual Activity  . Alcohol use: No    Alcohol/week: 0.0 standard drinks  . Drug use: No  . Sexual activity: Not on file  Other Topics Concern  . Not on file  Social History Narrative  . Not on file   Social Determinants of Health   Financial Resource Strain: Not on file  Food Insecurity: Not on file  Transportation Needs: Not on file  Physical Activity: Not on file  Stress: Not on file  Social Connections: Not on file  Intimate Partner Violence: Not on file    Outpatient Medications Prior to Visit  Medication Sig Dispense Refill  . acetaminophen (TYLENOL) 500 MG tablet Take 500 mg by mouth every 6 (six) hours as needed for moderate pain.    Marland Kitchen ALPRAZolam (XANAX) 0.25 MG tablet Take 0.25 mg by mouth daily.    Marland Kitchen aspirin 81 MG tablet Take 81 mg by mouth  daily.    Marland Kitchen atorvastatin (LIPITOR) 80 MG tablet TAKE 1 TABLET BY MOUTH EVERY DAY 90 tablet 1  . azelastine (ASTELIN) 0.1 % nasal spray PLACE 1 SPRAY INTO BOTH NOSTRILS 2 (TWO) TIMES DAILY AS DIRECTED 30 mL 1  . carvedilol (COREG) 12.5 MG tablet TAKE 1 TABLET (12.5 MG TOTAL) BY MOUTH 2 (TWO) TIMES DAILY WITH A MEAL. 180 tablet 3  . celecoxib (CELEBREX) 200 MG capsule Take 200 mg by mouth daily.     Marland Kitchen  clindamycin (CLEOCIN T) 1 % external solution     . clonazePAM (KLONOPIN) 0.5 MG tablet Take 0.5 mg by mouth at bedtime.     . diclofenac Sodium (VOLTAREN) 1 % GEL diclofenac 1 % topical gel    . ELIDEL 1 % cream APPLY TO AFFECTED AREA TWICE A DAY AS NEEDED FOR 14 DAYS  2  . escitalopram (LEXAPRO) 20 MG tablet Take 20 mg by mouth daily.    Marland Kitchen ezetimibe (ZETIA) 10 MG tablet TAKE 1 TABLET BY MOUTH EVERY DAY 90 tablet 1  . fluticasone (FLONASE) 50 MCG/ACT nasal spray USE 2 SPRAYS IN EACH NOSTRIL EVERY DAY 48 mL 2  . gabapentin (NEURONTIN) 600 MG tablet TAKE 1 TABLET BY MOUTH TWICE A DAY 180 tablet 1  . lisinopril (ZESTRIL) 20 MG tablet Take 1 tablet (20 mg total) by mouth daily. 90 tablet 3  . LOTEMAX 0.5 % GEL SMARTSIG:1 Drop(s) In Eye(s) Twice Daily PRN    . pantoprazole (PROTONIX) 40 MG tablet TAKE 1 TABLET BY MOUTH TWICE A DAY 180 tablet 3  . RESTASIS 0.05 % ophthalmic emulsion Place 1 drop into both eyes 2 (two) times daily.     . tacrolimus (PROTOPIC) 0.1 % ointment APPLY TO AFFECTED AREA ONCE A DAY    . tamsulosin (FLOMAX) 0.4 MG CAPS capsule TAKE 1 CAPSULE (0.4 MG TOTAL) BY MOUTH DAILY AFTER SUPPER. 90 capsule 1  . triamcinolone (KENALOG) 0.1 % APPLY TOPICALLY TO THE AFFECTED AREA 2 (TWO) TIMES DAILY AS NEEDED. 30 g 1  . triamcinolone (KENALOG) 0.1 % APPLY TOPICALLY TO THE AFFECTED AREA 2 (TWO) TIMES DAILY AS NEEDED. 80 g 1  . zolpidem (AMBIEN) 10 MG tablet Take 10 mg by mouth at bedtime as needed for sleep.     No facility-administered medications prior to visit.    Allergies  Allergen  Reactions  . Latex Other (See Comments)    Other  . Tape     Use Paper Tape   . Sulfa Antibiotics Rash    ROS Review of Systems  Constitutional: Negative for activity change, appetite change, fatigue and fever.  HENT: Negative for congestion, ear pain and trouble swallowing.   Eyes: Negative for pain and visual disturbance.  Respiratory: Negative for cough, shortness of breath and wheezing.   Cardiovascular: Negative for chest pain and palpitations.  Gastrointestinal: Negative for abdominal distention, abdominal pain, blood in stool, constipation, diarrhea, nausea, rectal pain and vomiting.  Genitourinary: Negative for dysuria, hematuria and testicular pain.  Musculoskeletal: Negative for arthralgias and joint swelling.  Skin: Negative for rash.  Neurological: Negative for dizziness, syncope and headaches.  Hematological: Negative for adenopathy.  Psychiatric/Behavioral: Negative for confusion and dysphoric mood. The patient is nervous/anxious.       Objective:    Physical Exam Vitals reviewed.  Constitutional:      Appearance: Normal appearance.  HENT:     Nose:     Comments: Nasal mucosa slightly dry.  No lesions.  No active bleeding. Cardiovascular:     Rate and Rhythm: Normal rate and regular rhythm.  Pulmonary:     Effort: Pulmonary effort is normal.     Breath sounds: Normal breath sounds.  Neurological:     Mental Status: He is alert.     BP 108/60   Pulse 63   Temp (!) 97.5 F (36.4 C) (Oral)   Ht 6\' 2"  (1.88 m)   Wt 222 lb (100.7 kg)   SpO2 98%   BMI 28.50 kg/m  Wt Readings from Last 3 Encounters:  05/25/20 222 lb (100.7 kg)  05/07/20 215 lb (97.5 kg)  04/23/20 214 lb (97.1 kg)     Health Maintenance Due  Topic Date Due  . PNA vac Low Risk Adult (2 of 2 - PPSV23) 05/12/2020    There are no preventive care reminders to display for this patient.  Lab Results  Component Value Date   TSH 1.62 03/30/2020   Lab Results  Component Value Date    WBC 4.1 03/30/2020   HGB 13.0 03/30/2020   HCT 38.4 (L) 03/30/2020   MCV 90.6 03/30/2020   PLT 173.0 03/30/2020   Lab Results  Component Value Date   NA 140 03/30/2020   K 4.8 03/30/2020   CO2 31 03/30/2020   GLUCOSE 122 (H) 03/30/2020   BUN 15 03/30/2020   CREATININE 1.14 03/30/2020   BILITOT 0.5 03/30/2020   ALKPHOS 56 03/30/2020   AST 20 03/30/2020   ALT 20 03/30/2020   PROT 6.8 03/30/2020   ALBUMIN 4.8 03/30/2020   CALCIUM 9.6 03/30/2020   ANIONGAP 7 04/03/2014   GFR 67.79 03/30/2020   Lab Results  Component Value Date   CHOL 146 03/30/2020   Lab Results  Component Value Date   HDL 49.10 03/30/2020   Lab Results  Component Value Date   LDLCALC 72 03/30/2020   Lab Results  Component Value Date   TRIG 128.0 03/30/2020   Lab Results  Component Value Date   CHOLHDL 3 03/30/2020   Lab Results  Component Value Date   HGBA1C 6.5 03/30/2020      Assessment & Plan:   Welcome to [employer] physical exam. -We did not obtain EKG as he sees cardiology regularly and this was not indicated at this time. -No labs indicated at this time -Prevnar 13 given.  Recommend Pneumovax in 1 year -Continue with annual flu vaccine -He plans to continue with annual eye exam  No orders of the defined types were placed in this encounter.   Follow-up: No follow-ups on file.    Carolann Littler, MD

## 2020-06-06 ENCOUNTER — Ambulatory Visit: Payer: Medicare Other | Admitting: Family Medicine

## 2020-06-30 ENCOUNTER — Other Ambulatory Visit: Payer: Self-pay | Admitting: Cardiology

## 2020-06-30 ENCOUNTER — Other Ambulatory Visit: Payer: Self-pay | Admitting: Family Medicine

## 2020-07-01 ENCOUNTER — Other Ambulatory Visit: Payer: Self-pay | Admitting: Family Medicine

## 2020-07-02 ENCOUNTER — Other Ambulatory Visit: Payer: Self-pay | Admitting: Family Medicine

## 2020-07-02 ENCOUNTER — Other Ambulatory Visit: Payer: Self-pay

## 2020-07-02 MED ORDER — AZELASTINE HCL 0.1 % NA SOLN: NASAL | 1 refills | Status: DC

## 2020-07-02 NOTE — Telephone Encounter (Signed)
Please advise. Rx was discontinued by another provider.

## 2020-07-02 NOTE — Telephone Encounter (Signed)
Confirm if he is still taking this.  If so, go ahead and refill for 6 months.

## 2020-07-02 NOTE — Telephone Encounter (Signed)
Okay to change prescription as requested

## 2020-07-02 NOTE — Telephone Encounter (Signed)
Confirmed the patient is taking this. Rx sent in. Nothing further needed

## 2020-07-02 NOTE — Telephone Encounter (Signed)
Rx has been sent to the pharmacy electronically. ° °

## 2020-07-08 ENCOUNTER — Other Ambulatory Visit: Payer: Self-pay | Admitting: Family Medicine

## 2020-07-10 ENCOUNTER — Other Ambulatory Visit: Payer: Self-pay | Admitting: Family Medicine

## 2020-07-10 ENCOUNTER — Encounter: Payer: Self-pay | Admitting: Family Medicine

## 2020-07-11 MED ORDER — OLOPATADINE HCL 0.6 % NA SOLN: NASAL | 0 refills | Status: DC

## 2020-07-11 NOTE — Telephone Encounter (Signed)
Per Dr. Johnna Snyder is just basically an antihistamine nasal spray. Does not contain any steroid. May send in prescription for Patanase 2 actuations in each nostril twice daily as needed for allergies

## 2020-07-27 ENCOUNTER — Encounter: Payer: Self-pay | Admitting: Family Medicine

## 2020-07-31 ENCOUNTER — Other Ambulatory Visit: Payer: Self-pay | Admitting: Family Medicine

## 2020-08-03 ENCOUNTER — Encounter: Payer: Self-pay | Admitting: Family Medicine

## 2020-08-03 ENCOUNTER — Other Ambulatory Visit: Payer: Self-pay

## 2020-08-03 ENCOUNTER — Ambulatory Visit (INDEPENDENT_AMBULATORY_CARE_PROVIDER_SITE_OTHER): Payer: Medicare Other | Admitting: Family Medicine

## 2020-08-03 DIAGNOSIS — J301 Allergic rhinitis due to pollen: Secondary | ICD-10-CM

## 2020-08-03 DIAGNOSIS — J309 Allergic rhinitis, unspecified: Secondary | ICD-10-CM | POA: Insufficient documentation

## 2020-08-03 NOTE — Progress Notes (Signed)
Established Patient Office Visit  Subjective:  Patient ID: Peter Snyder, male    DOB: [birth year]  Age: 65 y.o. MRN: [id number]  CC:  Chief Complaint  Patient presents with  . nasal medication    HPI [facility] presents for discussion regarding allergy symptoms.  He currently takes Flonase.  He had been on combination therapy with Astelin and that seemed to be working well for him.  He states that his pharmacy as he was told that there is a Engineer, maintenance.  He is here today to discuss other alternatives.  His drug plan had listed basically 3 different nasal steroids and cetirizine.  He is currently not taking any oral antihistamines.  He does a lot of walking outdoors and that seems to be a trigger for his allergies.  Past Medical History:  Diagnosis Date  . Allergy   . Arthritis   . CAD (coronary artery disease) 6/14   Cardiac catheterization 6/27/ 2014 ejection fraction 35-40%, 30% proximal left circumflex, 100% tiny obtuse marginal 1 with collaterals, 50% LAD, 50% D1, 100% RCA with collaterals  . Chicken pox   . Chronic back pain   . Colon polyps   . Depression   . Diabetes mellitus ([place])    Diet controlled- OFF metformin 02-01-20  . GERD (gastroesophageal reflux disease)   . Hyperlipidemia   . Hypertension   . Kidney stone   . Myocardial infarction [facility])    ? year- cath 2014/ june     Past Surgical History:  Procedure Laterality Date  . COLONOSCOPY    . CRYO INTERCOSTAL NERVE BLOCK     double sacrum nerve block  . epidural injections     multiple- L5 S1 and cervical neck pain issues as well   . POLYPECTOMY    . TONSILLECTOMY  1963  . TRIGGER FINGER RELEASE  2019    Family History  Problem Relation Age of Onset  . Melanoma Mother        metastatic  . Cancer Mother        melanoma  . Hypertension Mother   . Arthritis Father   . Colon polyps Father   . COPD Father   . Hyperlipidemia Maternal Grandmother   . Heart disease Maternal Grandmother    . Arthritis Paternal Grandmother   . Clotting disorder Paternal Grandmother   . Clotting disorder Paternal Grandfather   . Liver cancer Paternal Grandfather   . Colon cancer Neg Hx   . Esophageal cancer Neg Hx   . Rectal cancer Neg Hx   . Stomach cancer Neg Hx     Social History   Socioeconomic History  . Marital status: Married    Spouse name: Not on file  . Number of children: 3  . Years of education: Not on file  . Highest education level: Not on file  Occupational History  . Occupation: retired    Comment: Retired  Tobacco Use  . Smoking status: Former Smoker    Packs/day: 0.25    Years: 28.00    Pack years: 7.00    Start date: 43    Quit date: 2000    Years since quitting: 22.3  . Smokeless tobacco: Never Used  Vaping Use  . Vaping Use: Never used  Substance and Sexual Activity  . Alcohol use: No    Alcohol/week: 0.0 standard drinks  . Drug use: No  . Sexual activity: Not on file  Other Topics Concern  . Not on file  Social History Narrative  . Not on file   Social Determinants of Health   Financial Resource Strain: Not on file  Food Insecurity: Not on file  Transportation Needs: Not on file  Physical Activity: Not on file  Stress: Not on file  Social Connections: Not on file  Intimate Partner Violence: Not on file    Outpatient Medications Prior to Visit  Medication Sig Dispense Refill  . acetaminophen (TYLENOL) 500 MG tablet Take 500 mg by mouth every 6 (six) hours as needed for moderate pain.    Marland Kitchen ALPRAZolam (XANAX) 0.25 MG tablet Take 0.25 mg by mouth daily.    Marland Kitchen aspirin 81 MG tablet Take 81 mg by mouth daily.    Marland Kitchen atorvastatin (LIPITOR) 80 MG tablet TAKE 1 TABLET BY MOUTH EVERY DAY 90 tablet 1  . azelastine (ASTELIN) 0.1 % nasal spray PLACE 1 SPRAY INTO BOTH NOSTRILS 2 (TWO) TIMES DAILY AS DIRECTED 30 mL 1  . carvedilol (COREG) 12.5 MG tablet TAKE 1 TABLET (12.5 MG TOTAL) BY MOUTH 2 (TWO) TIMES DAILY WITH A MEAL. 180 tablet 0  . celecoxib  (CELEBREX) 200 MG capsule Take 200 mg by mouth daily.     . clonazePAM (KLONOPIN) 0.5 MG tablet Take 0.5 mg by mouth at bedtime.     . diclofenac Sodium (VOLTAREN) 1 % GEL diclofenac 1 % topical gel    . escitalopram (LEXAPRO) 20 MG tablet Take 20 mg by mouth daily.    Marland Kitchen ezetimibe (ZETIA) 10 MG tablet TAKE 1 TABLET BY MOUTH EVERY DAY 90 tablet 1  . fluticasone (FLONASE) 50 MCG/ACT nasal spray USE 2 SPRAYS IN EACH NOSTRIL EVERY DAY 48 mL 2  . gabapentin (NEURONTIN) 600 MG tablet TAKE 1 TABLET BY MOUTH TWICE A DAY 180 tablet 1  . lisinopril (ZESTRIL) 20 MG tablet Take 1 tablet (20 mg total) by mouth daily. 90 tablet 3  . LOTEMAX 0.5 % GEL SMARTSIG:1 Drop(s) In Eye(s) Twice Daily PRN    . pantoprazole (PROTONIX) 40 MG tablet TAKE 1 TABLET BY MOUTH TWICE A DAY 180 tablet 3  . RESTASIS 0.05 % ophthalmic emulsion Place 1 drop into both eyes 2 (two) times daily.     . tacrolimus (PROTOPIC) 0.1 % ointment APPLY TO AFFECTED AREA ONCE A DAY    . tamsulosin (FLOMAX) 0.4 MG CAPS capsule TAKE 1 CAPSULE (0.4 MG TOTAL) BY MOUTH DAILY AFTER SUPPER. 90 capsule 1  . zolpidem (AMBIEN) 10 MG tablet Take 10 mg by mouth at bedtime as needed for sleep.    Marland Kitchen Olopatadine HCl 0.6 % SOLN USE 2 SPRAYS IN EACH NOSTRIL TWICE A DAY AS NEEDED FOR ALLERGIES. 30.5 g 1  . clindamycin (CLEOCIN T) 1 % external solution     . ELIDEL 1 % cream APPLY TO AFFECTED AREA TWICE A DAY AS NEEDED FOR 14 DAYS  2  . metFORMIN (GLUCOPHAGE) 500 MG tablet TAKE 1 TABLET (500 MG TOTAL) BY MOUTH 2 (TWO) TIMES DAILY WITH A MEAL. 180 tablet 1  . triamcinolone (KENALOG) 0.1 % APPLY TOPICALLY TO THE AFFECTED AREA 2 (TWO) TIMES DAILY AS NEEDED. 30 g 1  . triamcinolone cream (KENALOG) 0.1 % APPLY TOPICALLY TO THE AFFECTED AREA 2 (TWO) TIMES DAILY AS NEEDED. 60 g 1   No facility-administered medications prior to visit.    Allergies  Allergen Reactions  . Latex Other (See Comments)    Other  . Tape     Use Paper Tape   . Sulfa Antibiotics Rash  ROS Review of Systems  Constitutional: Negative for chills and fever.  HENT: Positive for congestion and sneezing. Negative for sinus pressure.   Respiratory: Negative for cough and shortness of breath.       Objective:    Physical Exam Vitals reviewed.  Constitutional:      Appearance: Normal appearance.  Cardiovascular:     Rate and Rhythm: Normal rate and regular rhythm.  Pulmonary:     Effort: Pulmonary effort is normal.     Breath sounds: Normal breath sounds. No wheezing or rales.  Musculoskeletal:     Cervical back: Neck supple.  Lymphadenopathy:     Cervical: No cervical adenopathy.  Neurological:     Mental Status: He is alert.     BP 98/64   Pulse (!) 56   Temp 97.9 F (36.6 C) (Oral)   Ht 6\' 2"  (1.88 m)   Wt 220 lb (99.8 kg)   SpO2 97%   BMI 28.25 kg/m  Wt Readings from Last 3 Encounters:  08/03/20 220 lb (99.8 kg)  05/25/20 222 lb (100.7 kg)  05/07/20 215 lb (97.5 kg)     Health Maintenance Due  Topic Date Due  . OPHTHALMOLOGY EXAM  07/28/2020    There are no preventive care reminders to display for this patient.  Lab Results  Component Value Date   TSH 1.62 03/30/2020   Lab Results  Component Value Date   WBC 4.1 03/30/2020   HGB 13.0 03/30/2020   HCT 38.4 (L) 03/30/2020   MCV 90.6 03/30/2020   PLT 173.0 03/30/2020   Lab Results  Component Value Date   NA 140 03/30/2020   K 4.8 03/30/2020   CO2 31 03/30/2020   GLUCOSE 122 (H) 03/30/2020   BUN 15 03/30/2020   CREATININE 1.14 03/30/2020   BILITOT 0.5 03/30/2020   ALKPHOS 56 03/30/2020   AST 20 03/30/2020   ALT 20 03/30/2020   PROT 6.8 03/30/2020   ALBUMIN 4.8 03/30/2020   CALCIUM 9.6 03/30/2020   ANIONGAP 7 04/03/2014   GFR 67.79 03/30/2020   Lab Results  Component Value Date   CHOL 146 03/30/2020   Lab Results  Component Value Date   HDL 49.10 03/30/2020   Lab Results  Component Value Date   LDLCALC 72 03/30/2020   Lab Results  Component Value Date   TRIG  128.0 03/30/2020   Lab Results  Component Value Date   CHOLHDL 3 03/30/2020   Lab Results  Component Value Date   HGBA1C 6.5 03/30/2020      Assessment & Plan:   Problem List Items Addressed This Visit      Unprioritized   Allergic rhinitis     -Patient recently unable to get Astelin nasal and symptoms have flared. -Continue Flonase daily -We discussed starting once daily over-the-counter antihistamine such as levocetirizine, fexofenadine, or loratadine  No orders of the defined types were placed in this encounter.   Follow-up: No follow-ups on file.    Carolann Littler, MD

## 2020-08-03 NOTE — Patient Instructions (Signed)
Continue with the Flonase  Consider over the counter anti-histamine such as Xyzal, Claritan, or Allegra.  These should generally be non-sedating.

## 2020-08-06 NOTE — Progress Notes (Signed)
HPI: FU coronary artery disease. Previous cardiac care in [place]. Cardiac catheterization performed in June of 2014 showed an ejection fraction of 35-40%. The inferior wall is akinetic. The left main was normal. There was a 30% proximal circumflex, occluded small first obtuse marginal with collaterals, a 50% ramus intermedius, 50% LAD, 50% first diagonal and an occluded right coronary artery with collaterals. Treated medically. Nuclear study January 2017 showed ejection fraction 35%. There was a prior inferior infarct with mild peri-infarct ischemia. Cardiac MRI May 2017 showed akinetic inferior wall with ejection fraction 40%. Biatrial enlargement and mild aortic/mitral regurgitation. Echocardiogram July 2020 showed ejection fraction 55% (I reviewed and felt EF 40-45), moderate left ventricular hypertrophy, grade 2 diastolic dysfunction, mild left atrial enlargement, mild to moderate mitral regurgitation and mildly dilated ascending aorta. Abdominal ultrasound December 2020 showed no aneurysm.  MRA March 2022 showed 4.3 cm ascending aortic aneurysm.  Since last seen, the patient denies any dyspnea on exertion, orthopnea, PND, pedal edema, palpitations, syncope or chest pain.   Current Outpatient Medications  Medication Sig Dispense Refill  . acetaminophen (TYLENOL) 500 MG tablet Take 500 mg by mouth every 6 (six) hours as needed for moderate pain.    Marland Kitchen ALPRAZolam (XANAX) 0.25 MG tablet Take 0.25 mg by mouth daily.    Marland Kitchen aspirin 81 MG tablet Take 81 mg by mouth daily.    Marland Kitchen atorvastatin (LIPITOR) 80 MG tablet TAKE 1 TABLET BY MOUTH EVERY DAY 90 tablet 1  . azelastine (ASTELIN) 0.1 % nasal spray PLACE 1 SPRAY INTO BOTH NOSTRILS 2 (TWO) TIMES DAILY AS DIRECTED 30 mL 1  . carvedilol (COREG) 12.5 MG tablet TAKE 1 TABLET (12.5 MG TOTAL) BY MOUTH 2 (TWO) TIMES DAILY WITH A MEAL. 180 tablet 0  . celecoxib (CELEBREX) 200 MG capsule Take 200 mg by mouth daily.     . clonazePAM (KLONOPIN) 0.5 MG tablet  Take 0.5 mg by mouth at bedtime.     . diclofenac Sodium (VOLTAREN) 1 % GEL diclofenac 1 % topical gel    . escitalopram (LEXAPRO) 20 MG tablet Take 20 mg by mouth daily.    Marland Kitchen ezetimibe (ZETIA) 10 MG tablet TAKE 1 TABLET BY MOUTH EVERY DAY 90 tablet 1  . fluticasone (FLONASE) 50 MCG/ACT nasal spray USE 2 SPRAYS IN EACH NOSTRIL EVERY DAY 48 mL 2  . gabapentin (NEURONTIN) 600 MG tablet TAKE 1 TABLET BY MOUTH TWICE A DAY 180 tablet 1  . lisinopril (ZESTRIL) 20 MG tablet Take 1 tablet (20 mg total) by mouth daily. 90 tablet 3  . LOTEMAX 0.5 % GEL SMARTSIG:1 Drop(s) In Eye(s) Twice Daily PRN    . pantoprazole (PROTONIX) 40 MG tablet TAKE 1 TABLET BY MOUTH TWICE A DAY 180 tablet 3  . RESTASIS 0.05 % ophthalmic emulsion Place 1 drop into both eyes 2 (two) times daily.     . tacrolimus (PROTOPIC) 0.1 % ointment APPLY TO AFFECTED AREA ONCE A DAY    . tamsulosin (FLOMAX) 0.4 MG CAPS capsule TAKE 1 CAPSULE (0.4 MG TOTAL) BY MOUTH DAILY AFTER SUPPER. 90 capsule 1  . zolpidem (AMBIEN) 10 MG tablet Take 10 mg by mouth at bedtime as needed for sleep.     No current facility-administered medications for this visit.     Past Medical History:  Diagnosis Date  . Allergy   . Arthritis   . CAD (coronary artery disease) 6/14   Cardiac catheterization 6/27/ 2014 ejection fraction 35-40%, 30% proximal left circumflex, 100%  tiny obtuse marginal 1 with collaterals, 50% LAD, 50% D1, 100% RCA with collaterals  . Chicken pox   . Chronic back pain   . Colon polyps   . Depression   . Diabetes mellitus ([place])    Diet controlled- OFF metformin 02-01-20  . GERD (gastroesophageal reflux disease)   . Hyperlipidemia   . Hypertension   . Kidney stone   . Myocardial infarction [facility])    ? year- cath 2014/ june     Past Surgical History:  Procedure Laterality Date  . COLONOSCOPY    . CRYO INTERCOSTAL NERVE BLOCK     double sacrum nerve block  . epidural injections     multiple- L5 S1 and cervical neck pain issues  as well   . POLYPECTOMY    . TONSILLECTOMY  1963  . TRIGGER FINGER RELEASE  2019    Social History   Socioeconomic History  . Marital status: Married    Spouse name: Not on file  . Number of children: 3  . Years of education: Not on file  . Highest education level: Not on file  Occupational History  . Occupation: retired    Comment: Retired  Tobacco Use  . Smoking status: Former Smoker    Packs/day: 0.25    Years: 28.00    Pack years: 7.00    Start date: 52    Quit date: 2000    Years since quitting: 22.4  . Smokeless tobacco: Never Used  Vaping Use  . Vaping Use: Never used  Substance and Sexual Activity  . Alcohol use: No    Alcohol/week: 0.0 standard drinks  . Drug use: No  . Sexual activity: Not on file  Other Topics Concern  . Not on file  Social History Narrative  . Not on file   Social Determinants of Health   Financial Resource Strain: Not on file  Food Insecurity: Not on file  Transportation Needs: Not on file  Physical Activity: Not on file  Stress: Not on file  Social Connections: Not on file  Intimate Partner Violence: Not on file    Family History  Problem Relation Age of Onset  . Melanoma Mother        metastatic  . Cancer Mother        melanoma  . Hypertension Mother   . Arthritis Father   . Colon polyps Father   . COPD Father   . Hyperlipidemia Maternal Grandmother   . Heart disease Maternal Grandmother   . Arthritis Paternal Grandmother   . Clotting disorder Paternal Grandmother   . Clotting disorder Paternal Grandfather   . Liver cancer Paternal Grandfather   . Colon cancer Neg Hx   . Esophageal cancer Neg Hx   . Rectal cancer Neg Hx   . Stomach cancer Neg Hx     ROS: no fevers or chills, productive cough, hemoptysis, dysphasia, odynophagia, melena, hematochezia, dysuria, hematuria, rash, seizure activity, orthopnea, PND, pedal edema, claudication. Remaining systems are negative.  Physical Exam: Well-developed  well-nourished in no acute distress.  Skin is warm and dry.  HEENT is normal.  Neck is supple.  Chest is clear to auscultation with normal expansion.  Cardiovascular exam is regular rate and rhythm.  Abdominal exam nontender or distended. No masses palpated. Extremities show no edema. neuro grossly intact  ECG-normal sinus rhythm at a rate of 62, inferior infarct.  Personally reviewed  A/P  1 coronary artery disease-patient doing well with no chest pain.  Continue aspirin and  statin.  2 ischemic cardiomyopathy-we will discontinue lisinopril and instead treat with Entresto 24/26 twice daily.  Check potassium and renal function 1 week later.  Continue carvedilol.  3 hypertension-blood pressure controlled.  Continue present medications.  4 hyperlipidemia-continue Lipitor and Zetia.  5 dilated aortic root-we will plan follow-up MRA March 2023.  Kirk Ruths, MD

## 2020-08-07 ENCOUNTER — Encounter: Payer: Self-pay | Admitting: Family Medicine

## 2020-08-07 ENCOUNTER — Other Ambulatory Visit: Payer: Self-pay

## 2020-08-08 ENCOUNTER — Ambulatory Visit (INDEPENDENT_AMBULATORY_CARE_PROVIDER_SITE_OTHER): Payer: Medicare Other | Admitting: Family Medicine

## 2020-08-08 ENCOUNTER — Encounter: Payer: Self-pay | Admitting: Family Medicine

## 2020-08-08 VITALS — BP 120/70 | HR 80 | Temp 97.9°F | Wt 221.5 lb

## 2020-08-08 DIAGNOSIS — H3563 Retinal hemorrhage, bilateral: Secondary | ICD-10-CM | POA: Diagnosis not present

## 2020-08-08 DIAGNOSIS — E1165 Type 2 diabetes mellitus with hyperglycemia: Secondary | ICD-10-CM | POA: Diagnosis not present

## 2020-08-08 DIAGNOSIS — I10 Essential (primary) hypertension: Secondary | ICD-10-CM | POA: Diagnosis not present

## 2020-08-08 LAB — POCT GLYCOSYLATED HEMOGLOBIN (HGB A1C): Hemoglobin A1C: 6.2 % — AB (ref 4.0–5.6)

## 2020-08-08 NOTE — Progress Notes (Signed)
Established Patient Office Visit  Subjective:  Patient ID: Peter Snyder, male    DOB: 1955/10/25  Age: 65 y.o. MRN: [id number]  CC:  Chief Complaint  Patient presents with  . Follow-up    Pt told by his ophthalmologist that he has had some hemorrhaging in his eye and was told this could be due to his A1C    HPI [name] presents for diabetes follow-up.  He recently went to the optometrist and had dilated eye exam.  He had imaging which showed apparently some mild micro hemorrhaging right and left eye.  He was told to follow-up for his diabetes.  His A1c's have been consistently well controlled.  He exercises regularly.  Is fairly diligent regarding diet.  No recent visual changes.  He does take baby aspirin 1 daily.  Easy bruising upper extremities such as forearms.  Does not take any anticoagulants.  No recent headaches.  His blood pressures have been extremely well controlled.   Past Medical History:  Diagnosis Date  . Allergy   . Arthritis   . CAD (coronary artery disease) 6/14   Cardiac catheterization 6/27/ 2014 ejection fraction 35-40%, 30% proximal left circumflex, 100% tiny obtuse marginal 1 with collaterals, 50% LAD, 50% D1, 100% RCA with collaterals  . Chicken pox   . Chronic back pain   . Colon polyps   . Depression   . Diabetes mellitus ([place])    Diet controlled- OFF metformin 02-01-20  . GERD (gastroesophageal reflux disease)   . Hyperlipidemia   . Hypertension   . Kidney stone   . Myocardial infarction [facility])    ? year- cath 2014/ june     Past Surgical History:  Procedure Laterality Date  . COLONOSCOPY    . CRYO INTERCOSTAL NERVE BLOCK     double sacrum nerve block  . epidural injections     multiple- L5 S1 and cervical neck pain issues as well   . POLYPECTOMY    . TONSILLECTOMY  1963  . TRIGGER FINGER RELEASE  2019    Family History  Problem Relation Age of Onset  . Melanoma Mother        metastatic  . Cancer Mother        melanoma  .  Hypertension Mother   . Arthritis Father   . Colon polyps Father   . COPD Father   . Hyperlipidemia Maternal Grandmother   . Heart disease Maternal Grandmother   . Arthritis Paternal Grandmother   . Clotting disorder Paternal Grandmother   . Clotting disorder Paternal Grandfather   . Liver cancer Paternal Grandfather   . Colon cancer Neg Hx   . Esophageal cancer Neg Hx   . Rectal cancer Neg Hx   . Stomach cancer Neg Hx     Social History   Socioeconomic History  . Marital status: Married    Spouse name: Not on file  . Number of children: 3  . Years of education: Not on file  . Highest education level: Not on file  Occupational History  . Occupation: retired    Comment: Retired  Tobacco Use  . Smoking status: Former Smoker    Packs/day: 0.25    Years: 28.00    Pack years: 7.00    Start date: 36    Quit date: 2000    Years since quitting: 22.4  . Smokeless tobacco: Never Used  Vaping Use  . Vaping Use: Never used  Substance and Sexual Activity  . Alcohol use: No  Alcohol/week: 0.0 standard drinks  . Drug use: No  . Sexual activity: Not on file  Other Topics Concern  . Not on file  Social History Narrative  . Not on file   Social Determinants of Health   Financial Resource Strain: Not on file  Food Insecurity: Not on file  Transportation Needs: Not on file  Physical Activity: Not on file  Stress: Not on file  Social Connections: Not on file  Intimate Partner Violence: Not on file    Outpatient Medications Prior to Visit  Medication Sig Dispense Refill  . acetaminophen (TYLENOL) 500 MG tablet Take 500 mg by mouth every 6 (six) hours as needed for moderate pain.    Marland Kitchen ALPRAZolam (XANAX) 0.25 MG tablet Take 0.25 mg by mouth daily.    Marland Kitchen aspirin 81 MG tablet Take 81 mg by mouth daily.    Marland Kitchen atorvastatin (LIPITOR) 80 MG tablet TAKE 1 TABLET BY MOUTH EVERY DAY 90 tablet 1  . azelastine (ASTELIN) 0.1 % nasal spray PLACE 1 SPRAY INTO BOTH NOSTRILS 2 (TWO) TIMES  DAILY AS DIRECTED 30 mL 1  . carvedilol (COREG) 12.5 MG tablet TAKE 1 TABLET (12.5 MG TOTAL) BY MOUTH 2 (TWO) TIMES DAILY WITH A MEAL. 180 tablet 0  . celecoxib (CELEBREX) 200 MG capsule Take 200 mg by mouth daily.     . clonazePAM (KLONOPIN) 0.5 MG tablet Take 0.5 mg by mouth at bedtime.     . diclofenac Sodium (VOLTAREN) 1 % GEL diclofenac 1 % topical gel    . escitalopram (LEXAPRO) 20 MG tablet Take 20 mg by mouth daily.    Marland Kitchen ezetimibe (ZETIA) 10 MG tablet TAKE 1 TABLET BY MOUTH EVERY DAY 90 tablet 1  . fluticasone (FLONASE) 50 MCG/ACT nasal spray USE 2 SPRAYS IN EACH NOSTRIL EVERY DAY 48 mL 2  . gabapentin (NEURONTIN) 600 MG tablet TAKE 1 TABLET BY MOUTH TWICE A DAY 180 tablet 1  . lisinopril (ZESTRIL) 20 MG tablet Take 1 tablet (20 mg total) by mouth daily. 90 tablet 3  . LOTEMAX 0.5 % GEL SMARTSIG:1 Drop(s) In Eye(s) Twice Daily PRN    . pantoprazole (PROTONIX) 40 MG tablet TAKE 1 TABLET BY MOUTH TWICE A DAY 180 tablet 3  . RESTASIS 0.05 % ophthalmic emulsion Place 1 drop into both eyes 2 (two) times daily.     . tacrolimus (PROTOPIC) 0.1 % ointment APPLY TO AFFECTED AREA ONCE A DAY    . tamsulosin (FLOMAX) 0.4 MG CAPS capsule TAKE 1 CAPSULE (0.4 MG TOTAL) BY MOUTH DAILY AFTER SUPPER. 90 capsule 1  . zolpidem (AMBIEN) 10 MG tablet Take 10 mg by mouth at bedtime as needed for sleep.     No facility-administered medications prior to visit.    Allergies  Allergen Reactions  . Latex Other (See Comments)    Other  . Tape     Use Paper Tape   . Sulfa Antibiotics Rash    ROS Review of Systems  Constitutional: Negative for fatigue.  Eyes: Negative for visual disturbance.  Respiratory: Negative for cough, chest tightness and shortness of breath.   Cardiovascular: Negative for chest pain, palpitations and leg swelling.  Neurological: Negative for dizziness, syncope, weakness, light-headedness and headaches.      Objective:    Physical Exam Constitutional:      Appearance: He is  well-developed.  HENT:     Right Ear: External ear normal.     Left Ear: External ear normal.  Eyes:  Pupils: Pupils are equal, round, and reactive to light.  Neck:     Thyroid: No thyromegaly.  Cardiovascular:     Rate and Rhythm: Normal rate and regular rhythm.  Pulmonary:     Effort: Pulmonary effort is normal. No respiratory distress.     Breath sounds: Normal breath sounds. No wheezing or rales.  Musculoskeletal:     Cervical back: Neck supple.  Neurological:     Mental Status: He is alert and oriented to person, place, and time.     BP 120/70 (BP Location: Left Arm, Patient Position: Sitting, Cuff Size: Normal)   Pulse 80   Temp 97.9 F (36.6 C) (Oral)   Wt 221 lb 8 oz (100.5 kg)   SpO2 97%   BMI 28.44 kg/m  Wt Readings from Last 3 Encounters:  08/08/20 221 lb 8 oz (100.5 kg)  08/03/20 220 lb (99.8 kg)  05/25/20 222 lb (100.7 kg)     Health Maintenance Due  Topic Date Due  . OPHTHALMOLOGY EXAM  07/28/2020    There are no preventive care reminders to display for this patient.  Lab Results  Component Value Date   TSH 1.62 03/30/2020   Lab Results  Component Value Date   WBC 4.1 03/30/2020   HGB 13.0 03/30/2020   HCT 38.4 (L) 03/30/2020   MCV 90.6 03/30/2020   PLT 173.0 03/30/2020   Lab Results  Component Value Date   NA 140 03/30/2020   K 4.8 03/30/2020   CO2 31 03/30/2020   GLUCOSE 122 (H) 03/30/2020   BUN 15 03/30/2020   CREATININE 1.14 03/30/2020   BILITOT 0.5 03/30/2020   ALKPHOS 56 03/30/2020   AST 20 03/30/2020   ALT 20 03/30/2020   PROT 6.8 03/30/2020   ALBUMIN 4.8 03/30/2020   CALCIUM 9.6 03/30/2020   ANIONGAP 7 04/03/2014   GFR 67.79 03/30/2020   Lab Results  Component Value Date   CHOL 146 03/30/2020   Lab Results  Component Value Date   HDL 49.10 03/30/2020   Lab Results  Component Value Date   LDLCALC 72 03/30/2020   Lab Results  Component Value Date   TRIG 128.0 03/30/2020   Lab Results  Component Value Date    CHOLHDL 3 03/30/2020   Lab Results  Component Value Date   HGBA1C 6.2 (A) 08/08/2020      Assessment & Plan:   #1 type 2 diabetes well controlled with A1c 6.2%  -Continue to manage with lifestyle modification with low glycemic diet and regular exercise -Continue A1c every 6 months  #2 hypertension stable and at goal -Continue current medications with carvedilol and lisinopril.  #3 reported microhemorrhages of both retina.  No evidence for poorly controlled diabetes or poorly controlled hypertension at this time.  Does take ASA.  He will continue close follow-up with ophthalmology  No orders of the defined types were placed in this encounter.   Follow-up: No follow-ups on file.    Carolann Littler, MD

## 2020-08-08 NOTE — Patient Instructions (Signed)
A1C is 6.2% which is stable and excellent.   Keep up the good work.

## 2020-08-15 ENCOUNTER — Encounter: Payer: Self-pay | Admitting: Family Medicine

## 2020-08-16 ENCOUNTER — Other Ambulatory Visit: Payer: Self-pay | Admitting: Family Medicine

## 2020-08-20 ENCOUNTER — Other Ambulatory Visit: Payer: Self-pay

## 2020-08-20 ENCOUNTER — Encounter: Payer: Self-pay | Admitting: Cardiology

## 2020-08-20 ENCOUNTER — Ambulatory Visit (INDEPENDENT_AMBULATORY_CARE_PROVIDER_SITE_OTHER): Payer: Medicare Other | Admitting: Cardiology

## 2020-08-20 VITALS — BP 124/70 | HR 62 | Ht 74.0 in | Wt 219.6 lb

## 2020-08-20 DIAGNOSIS — I712 Thoracic aortic aneurysm, without rupture, unspecified: Secondary | ICD-10-CM

## 2020-08-20 DIAGNOSIS — I10 Essential (primary) hypertension: Secondary | ICD-10-CM

## 2020-08-20 DIAGNOSIS — I255 Ischemic cardiomyopathy: Secondary | ICD-10-CM | POA: Diagnosis not present

## 2020-08-20 DIAGNOSIS — I2510 Atherosclerotic heart disease of native coronary artery without angina pectoris: Secondary | ICD-10-CM

## 2020-08-20 DIAGNOSIS — E7800 Pure hypercholesterolemia, unspecified: Secondary | ICD-10-CM

## 2020-08-20 MED ORDER — SACUBITRIL-VALSARTAN 24-26 MG PO TABS: 1.0000 | ORAL_TABLET | Freq: Two times a day (BID) | ORAL | 11 refills | Status: DC

## 2020-08-20 NOTE — Patient Instructions (Signed)
Medication Instructions:   STOP LISINOPRIL  AFTER 48 HOURS START ENTRESTO 24/26 MG ONE TABLET TWICE DAILY ' *If you need a refill on your cardiac medications before your next appointment, please call your pharmacy*   Lab Work:  Your physician recommends that you return for lab work in: [place]  If you have labs (blood work) drawn today and your tests are completely normal, you will receive your results only by: Marland Kitchen MyChart Message (if you have MyChart) OR . A paper copy in the mail If you have any lab test that is abnormal or we need to change your treatment, we will call you to review the results.   Follow-Up: At [facility], you and your health needs are our priority.  As part of our continuing mission to provide you with exceptional heart care, we have created designated Provider Care Teams.  These Care Teams include your primary Cardiologist (physician) and Advanced Practice Providers (APPs -  Physician Assistants and Nurse Practitioners) who all work together to provide you with the care you need, when you need it.  We recommend signing up for the patient portal called "MyChart".  Sign up information is provided on this After Visit Summary.  MyChart is used to connect with patients for Virtual Visits (Telemedicine).  Patients are able to view lab/test results, encounter notes, upcoming appointments, etc.  Non-urgent messages can be sent to your provider as well.   To learn more about what you can do with MyChart, go to [email or web address].    Your next appointment:   12 month(s)  The format for your next appointment:   In Person  Provider:   Kirk Ruths, MD

## 2020-08-23 ENCOUNTER — Ambulatory Visit (INDEPENDENT_AMBULATORY_CARE_PROVIDER_SITE_OTHER): Payer: Medicare Other | Admitting: Registered Nurse

## 2020-08-23 ENCOUNTER — Other Ambulatory Visit: Payer: Self-pay

## 2020-08-23 ENCOUNTER — Encounter: Payer: Self-pay | Admitting: Registered Nurse

## 2020-08-23 VITALS — BP 105/56 | HR 80 | Temp 98.0°F | Resp 18 | Ht 74.0 in | Wt 221.0 lb

## 2020-08-23 DIAGNOSIS — L03317 Cellulitis of buttock: Secondary | ICD-10-CM

## 2020-08-23 MED ORDER — MUPIROCIN CALCIUM 2 % EX CREA: 1.0000 "application " | TOPICAL_CREAM | Freq: Two times a day (BID) | CUTANEOUS | 0 refills | Status: DC

## 2020-08-23 MED ORDER — DOXYCYCLINE HYCLATE 100 MG PO TABS: 100.0000 mg | ORAL_TABLET | Freq: Two times a day (BID) | ORAL | 0 refills | Status: DC

## 2020-08-23 NOTE — Patient Instructions (Addendum)
Peter Snyder -   [place] to meet you   Let me know if things don't get better or get any worse. If systemic symptoms of infection develop please seek care at an urgent care or ER. It appears fairly early on in its course so I doubt this occurs.  Doxycycline can be rough on the stomach - stay hydrated and eat bland foods with this  Good idea to delay epidural  Continue walking as tolerated  Hope you feel better soon  Kathrin Ruddy, NP  If you have lab work done today you will be contacted with your lab results within the next 2 weeks.  If you have not heard from [place] then please contact us. The fastest way to get your results is to register for My Chart.   IF you received an x-ray today, you will receive an invoice from [facility]. Please contact [facility] at [phone] with questions or concerns regarding your invoice.   IF you received labwork today, you will receive an invoice from [facility]. Please contact [facility] at [phone] with questions or concerns regarding your invoice.   Our billing staff will not be able to assist you with questions regarding bills from these companies.  You will be contacted with the lab results as soon as they are available. The fastest way to get your results is to activate your My Chart account. Instructions are located on the last page of this paperwork. If you have not heard from [place] regarding the results in 2 weeks, please contact this office.

## 2020-08-23 NOTE — Progress Notes (Signed)
Acute Office Visit  Subjective:    Patient ID: Peter Snyder, male    DOB: [birth year], 65 y.o.   MRN: [id number]  Chief Complaint  Patient presents with   Recurrent Skin Infections    Patient states he has noticed a boil on rear end on yesterday and its very painful today. Patient didn't know it was a boil until he had his wife take a look.    HPI Patient is in today for infection  Skin infection r buttock Onset last night. Worse this morning No systemic symptoms Local pain and redness, mildly warm Scant purulent drainage About 4-5" from rectum   Past Medical History:  Diagnosis Date   Allergy    Arthritis    CAD (coronary artery disease) 6/14   Cardiac catheterization 6/27/ 2014 ejection fraction 35-40%, 30% proximal left circumflex, 100% tiny obtuse marginal 1 with collaterals, 50% LAD, 50% D1, 100% RCA with collaterals   Chicken pox    Chronic back pain    Colon polyps    Depression    Diabetes mellitus ([place])    Diet controlled- OFF metformin 02-01-20   GERD (gastroesophageal reflux disease)    Hyperlipidemia    Hypertension    Kidney stone    Myocardial infarction [facility])    ? year- cath 2014/ june     Past Surgical History:  Procedure Laterality Date   COLONOSCOPY     CRYO INTERCOSTAL NERVE BLOCK     double sacrum nerve block   epidural injections     multiple- L5 S1 and cervical neck pain issues as well    POLYPECTOMY     TONSILLECTOMY  1963   TRIGGER FINGER RELEASE  2019    Family History  Problem Relation Age of Onset   Melanoma Mother        metastatic   Cancer Mother        melanoma   Hypertension Mother    Arthritis Father    Colon polyps Father    COPD Father    Hyperlipidemia Maternal Grandmother    Heart disease Maternal Grandmother    Arthritis Paternal Grandmother    Clotting disorder Paternal Grandmother    Clotting disorder Paternal Grandfather    Liver cancer Paternal Grandfather    Colon cancer Neg Hx    Esophageal cancer Neg  Hx    Rectal cancer Neg Hx    Stomach cancer Neg Hx     Social History   Socioeconomic History   Marital status: Married    Spouse name: Not on file   Number of children: 3   Years of education: Not on file   Highest education level: Not on file  Occupational History   Occupation: retired    Comment: Retired  Tobacco Use   Smoking status: Former    Packs/day: 0.25    Years: 28.00    Pack years: 7.00    Types: Cigarettes    Start date: 1972    Quit date: 2000    Years since quitting: 22.4   Smokeless tobacco: Never  Vaping Use   Vaping Use: Never used  Substance and Sexual Activity   Alcohol use: No    Alcohol/week: 0.0 standard drinks   Drug use: No   Sexual activity: Not on file  Other Topics Concern   Not on file  Social History Narrative   Not on file   Social Determinants of Health   Financial Resource Strain: Not on file  Food Insecurity: Not  on file  Transportation Needs: Not on file  Physical Activity: Not on file  Stress: Not on file  Social Connections: Not on file  Intimate Partner Violence: Not on file    Outpatient Medications Prior to Visit  Medication Sig Dispense Refill   acetaminophen (TYLENOL) 500 MG tablet Take 500 mg by mouth every 6 (six) hours as needed for moderate pain.     ALPRAZolam (XANAX) 0.25 MG tablet Take 0.25 mg by mouth daily.     aspirin 81 MG tablet Take 81 mg by mouth daily.     atorvastatin (LIPITOR) 80 MG tablet TAKE 1 TABLET BY MOUTH EVERY DAY 90 tablet 1   azelastine (ASTELIN) 0.1 % nasal spray PLACE 1 SPRAY INTO BOTH NOSTRILS 2 (TWO) TIMES DAILY AS DIRECTED 30 mL 1   carvedilol (COREG) 12.5 MG tablet TAKE 1 TABLET (12.5 MG TOTAL) BY MOUTH 2 (TWO) TIMES DAILY WITH A MEAL. 180 tablet 0   celecoxib (CELEBREX) 200 MG capsule Take 200 mg by mouth daily.      clonazePAM (KLONOPIN) 0.5 MG tablet Take 0.5 mg by mouth at bedtime.      diclofenac Sodium (VOLTAREN) 1 % GEL diclofenac 1 % topical gel     escitalopram (LEXAPRO) 20  MG tablet Take 20 mg by mouth daily.     ezetimibe (ZETIA) 10 MG tablet TAKE 1 TABLET BY MOUTH EVERY DAY 90 tablet 1   fluticasone (FLONASE) 50 MCG/ACT nasal spray USE 2 SPRAYS IN EACH NOSTRIL EVERY DAY 48 mL 2   gabapentin (NEURONTIN) 600 MG tablet TAKE 1 TABLET BY MOUTH TWICE A DAY 180 tablet 1   LOTEMAX 0.5 % GEL SMARTSIG:1 Drop(s) In Eye(s) Twice Daily PRN     pantoprazole (PROTONIX) 40 MG tablet TAKE 1 TABLET BY MOUTH TWICE A DAY 180 tablet 3   RESTASIS 0.05 % ophthalmic emulsion Place 1 drop into both eyes 2 (two) times daily.      sacubitril-valsartan (ENTRESTO) 24-26 MG Take 1 tablet by mouth 2 (two) times daily. 60 tablet 11   tacrolimus (PROTOPIC) 0.1 % ointment APPLY TO AFFECTED AREA ONCE A DAY     tamsulosin (FLOMAX) 0.4 MG CAPS capsule TAKE 1 CAPSULE (0.4 MG TOTAL) BY MOUTH DAILY AFTER SUPPER. 90 capsule 1   zolpidem (AMBIEN) 10 MG tablet Take 10 mg by mouth at bedtime as needed for sleep.     No facility-administered medications prior to visit.    Allergies  Allergen Reactions   Latex Other (See Comments)    Other   Tape     Use Paper Tape    Sulfa Antibiotics Rash    Review of Systems Per hpi, otherwise negative    Objective:    Physical Exam Vitals and nursing note reviewed.  Constitutional:      Appearance: Normal appearance.  Cardiovascular:     Rate and Rhythm: Normal rate and regular rhythm.     Pulses: Normal pulses.     Heart sounds: Normal heart sounds. No murmur heard.   No friction rub. No gallop.  Pulmonary:     Effort: Pulmonary effort is normal. No respiratory distress.     Breath sounds: Normal breath sounds. No stridor. No wheezing, rhonchi or rales.  Skin:    Findings: Erythema and lesion (small infection on r buttock. scant drainage with pressure of purulent fluid. no distinct palpable mass or abscess. some local erythema and warmth about 1" in diameter.) present.  Neurological:     General: No focal  deficit present.     Mental Status: He  is alert. Mental status is at baseline.  Psychiatric:        Mood and Affect: Mood normal.        Behavior: Behavior normal.        Thought Content: Thought content normal.        Judgment: Judgment normal.    BP (!) 105/56   Pulse 80   Temp 98 F (36.7 C) (Temporal)   Resp 18   Ht 6\' 2"  (1.88 m)   Wt 221 lb (100.2 kg)   SpO2 98%   BMI 28.37 kg/m  Wt Readings from Last 3 Encounters:  08/23/20 221 lb (100.2 kg)  08/20/20 219 lb 9.6 oz (99.6 kg)  08/08/20 221 lb 8 oz (100.5 kg)    Health Maintenance Due  Topic Date Due   OPHTHALMOLOGY EXAM  07/28/2020    There are no preventive care reminders to display for this patient.   Lab Results  Component Value Date   TSH 1.62 03/30/2020   Lab Results  Component Value Date   WBC 4.1 03/30/2020   HGB 13.0 03/30/2020   HCT 38.4 (L) 03/30/2020   MCV 90.6 03/30/2020   PLT 173.0 03/30/2020   Lab Results  Component Value Date   NA 140 03/30/2020   K 4.8 03/30/2020   CO2 31 03/30/2020   GLUCOSE 122 (H) 03/30/2020   BUN 15 03/30/2020   CREATININE 1.14 03/30/2020   BILITOT 0.5 03/30/2020   ALKPHOS 56 03/30/2020   AST 20 03/30/2020   ALT 20 03/30/2020   PROT 6.8 03/30/2020   ALBUMIN 4.8 03/30/2020   CALCIUM 9.6 03/30/2020   ANIONGAP 7 04/03/2014   GFR 67.79 03/30/2020   Lab Results  Component Value Date   CHOL 146 03/30/2020   Lab Results  Component Value Date   HDL 49.10 03/30/2020   Lab Results  Component Value Date   LDLCALC 72 03/30/2020   Lab Results  Component Value Date   TRIG 128.0 03/30/2020   Lab Results  Component Value Date   CHOLHDL 3 03/30/2020   Lab Results  Component Value Date   HGBA1C 6.2 (A) 08/08/2020       Assessment & Plan:   Problem List Items Addressed This Visit   None Visit Diagnoses     Cellulitis of right buttock    -  Primary   Relevant Medications   doxycycline (VIBRA-TABS) 100 MG tablet   mupirocin cream (BACTROBAN) 2 %        Meds ordered this encounter   Medications   doxycycline (VIBRA-TABS) 100 MG tablet    Sig: Take 1 tablet (100 mg total) by mouth 2 (two) times daily.    Dispense:  14 tablet    Refill:  0    Order Specific Question:   Supervising Provider    Answer:   Carlota Raspberry, JEFFREY R [[id number]]   mupirocin cream (BACTROBAN) 2 %    Sig: Apply 1 application topically 2 (two) times daily.    Dispense:  15 g    Refill:  0    Order Specific Question:   Supervising Provider    Answer:   Carlota Raspberry, JEFFREY R [[id number]]   PLAN Local infection, no drainable abscess. Will treat with po doxy and topical mupirocin as above.  Return and ER precautions reviewed with patient who voices understanding. Delay upcoming epidural until infection resolved Continue other daily activity as tolerated Patient encouraged to call clinic with  any questions, comments, or concerns.  Maximiano Coss, NP

## 2020-08-25 ENCOUNTER — Encounter: Payer: Self-pay | Admitting: Nurse Practitioner

## 2020-08-25 ENCOUNTER — Ambulatory Visit (HOSPITAL_COMMUNITY)
Admission: EM | Admit: 2020-08-25 | Discharge: 2020-08-25 | Disposition: A | Discharge status: Home / Self Care | Payer: Medicare Other | Attending: Medical Oncology | Admitting: Medical Oncology

## 2020-08-25 ENCOUNTER — Telehealth: Payer: Medicare Other | Admitting: Nurse Practitioner

## 2020-08-25 ENCOUNTER — Other Ambulatory Visit: Payer: Self-pay

## 2020-08-25 ENCOUNTER — Encounter (HOSPITAL_COMMUNITY): Payer: Self-pay | Admitting: Emergency Medicine

## 2020-08-25 ENCOUNTER — Other Ambulatory Visit: Payer: Self-pay | Admitting: Family Medicine

## 2020-08-25 DIAGNOSIS — R110 Nausea: Secondary | ICD-10-CM | POA: Diagnosis not present

## 2020-08-25 DIAGNOSIS — I255 Ischemic cardiomyopathy: Secondary | ICD-10-CM

## 2020-08-25 MED ORDER — ONDANSETRON 4 MG PO TBDP: 4.0000 mg | ORAL_TABLET | Freq: Three times a day (TID) | ORAL | 0 refills | Status: DC | PRN

## 2020-08-25 MED ORDER — CEPHALEXIN 500 MG PO CAPS: 500.0000 mg | ORAL_CAPSULE | Freq: Three times a day (TID) | ORAL | 0 refills | Status: DC

## 2020-08-25 NOTE — Progress Notes (Signed)
[name], Peter Snyder are scheduled for a virtual visit with Peter Snyder< FNP today.    Just as we do with appointments in the office, we must obtain your consent to participate.  Your consent will be active for this visit and any virtual visit you may have with one of our providers in the next 365 days.    If you have a MyChart account, I can also send a copy of this consent to you electronically.  All virtual visits are billed to your insurance company just like a traditional visit in the office.  As this is a virtual visit, video technology does not allow for your provider to perform a traditional examination.  This may limit your provider's ability to fully assess your condition.  If your provider identifies any concerns that need to be evaluated in person or the need to arrange testing such as labs, EKG, etc, we will make arrangements to do so.    Although advances in technology are sophisticated, we cannot ensure that it will always work on either your end or our end.  If the connection with a video visit is poor, we may have to switch to a telephone visit.  With either a video or telephone visit, we are not always able to ensure that we have a secure connection.   I need to obtain your verbal consent now.   Are you willing to proceed with your visit today?   [facility] has provided verbal consent on 08/25/2020 for a virtual visit (video or telephone).  Virtual Visit via Video  I connected with  [facility]  on 08/25/20 at 6:00 by video and verified that I am speaking with the correct person using two identifiers. [facility] is currently located at home and no one is currently with him during visit. The provider, Peter Hassell Done, FNP is located at home at time of visit.  I discussed the limitations, risks, security and privacy concerns of performing an evaluation and management service by video  and the availability of in person appointments. I also discussed with the patient that there  may be a patient responsible charge related to this service. The patient expressed understanding and agreed to proceed.   Subjective:   HPI:   Patient stated that he had a boiled . He was put on doxycycline and that is causing is nausea. He has taken zofran and that has not helped with the nausea.  Review of Systems  Constitutional:  Negative for diaphoresis and weight loss.  Eyes:  Negative for blurred vision, double vision and pain.  Respiratory:  Negative for shortness of breath.   Cardiovascular:  Negative for chest pain, palpitations, orthopnea and leg swelling.  Gastrointestinal:  Negative for abdominal pain.  Skin:  Negative for rash.  Neurological:  Negative for dizziness, sensory change, loss of consciousness, weakness and headaches.  Endo/Heme/Allergies:  Negative for polydipsia. Does not bruise/bleed easily.  Psychiatric/Behavioral:  Negative for memory loss. The patient does not have insomnia.   All other systems reviewed and are negative.  See pertinent positives and negatives per HPI.  Patient Active Problem List   Diagnosis Date Noted   Allergic rhinitis 08/03/2020   Type 2 diabetes mellitus with hyperglycemia ([place]) 07/11/2019   Pes anserinus bursitis of left knee 05/20/2018   Pain in left knee 02/26/2018   DDD (degenerative disc disease), cervical 06/02/2017   Degeneration of lumbar intervertebral disc 05/25/2017   Acquired trigger finger of left middle finger 05/08/2017   Pain in  finger of left hand 05/08/2017   Trigger finger 03/28/2017   Viral URI 03/16/2015   Gastroenteritis 04/03/2014   Chronic insomnia 03/14/2014   Peripheral neuropathy 09/02/2013   Cardiomyopathy, ischemic 01/18/2013   CAD (coronary artery disease) 12/29/2012   GERD (gastroesophageal reflux disease) 12/29/2012   Essential hypertension, benign 12/29/2012   Dyslipidemia 12/29/2012   Personal history of colonic polyps 12/29/2012   Kidney stones 12/29/2012   Depression 12/29/2012    Degenerative arthritis of lumbar spine 12/29/2012   BPH (benign prostatic hyperplasia) 12/29/2012   Essential tremor 12/29/2012   Prediabetes 12/29/2012    Social History   Tobacco Use   Smoking status: Former    Packs/day: 0.25    Years: 28.00    Pack years: 7.00    Types: Cigarettes    Start date: 8    Quit date: 2000    Years since quitting: 22.4   Smokeless tobacco: Never  Substance Use Topics   Alcohol use: No    Alcohol/week: 0.0 standard drinks    Current Outpatient Medications:    acetaminophen (TYLENOL) 500 MG tablet, Take 500 mg by mouth every 6 (six) hours as needed for moderate pain., Disp: , Rfl:    ALPRAZolam (XANAX) 0.25 MG tablet, Take 0.25 mg by mouth daily., Disp: , Rfl:    aspirin 81 MG tablet, Take 81 mg by mouth daily., Disp: , Rfl:    atorvastatin (LIPITOR) 80 MG tablet, TAKE 1 TABLET BY MOUTH EVERY DAY, Disp: 90 tablet, Rfl: 1   azelastine (ASTELIN) 0.1 % nasal spray, PLACE 1 SPRAY INTO BOTH NOSTRILS 2 (TWO) TIMES DAILY AS DIRECTED, Disp: 30 mL, Rfl: 1   carvedilol (COREG) 12.5 MG tablet, TAKE 1 TABLET (12.5 MG TOTAL) BY MOUTH 2 (TWO) TIMES DAILY WITH A MEAL., Disp: 180 tablet, Rfl: 0   celecoxib (CELEBREX) 200 MG capsule, Take 200 mg by mouth daily. , Disp: , Rfl:    clonazePAM (KLONOPIN) 0.5 MG tablet, Take 0.5 mg by mouth at bedtime. , Disp: , Rfl:    diclofenac Sodium (VOLTAREN) 1 % GEL, diclofenac 1 % topical gel, Disp: , Rfl:    doxycycline (VIBRA-TABS) 100 MG tablet, Take 1 tablet (100 mg total) by mouth 2 (two) times daily., Disp: 14 tablet, Rfl: 0   escitalopram (LEXAPRO) 20 MG tablet, Take 20 mg by mouth daily., Disp: , Rfl:    ezetimibe (ZETIA) 10 MG tablet, TAKE 1 TABLET BY MOUTH EVERY DAY, Disp: 90 tablet, Rfl: 1   fluticasone (FLONASE) 50 MCG/ACT nasal spray, USE 2 SPRAYS IN EACH NOSTRIL EVERY DAY, Disp: 48 mL, Rfl: 2   gabapentin (NEURONTIN) 600 MG tablet, TAKE 1 TABLET BY MOUTH TWICE A DAY, Disp: 180 tablet, Rfl: 1   LOTEMAX 0.5 % GEL,  SMARTSIG:1 Drop(s) In Eye(s) Twice Daily PRN, Disp: , Rfl:    mupirocin cream (BACTROBAN) 2 %, Apply 1 application topically 2 (two) times daily., Disp: 15 g, Rfl: 0   ondansetron (ZOFRAN ODT) 4 MG disintegrating tablet, Take 1 tablet (4 mg total) by mouth every 8 (eight) hours as needed for nausea or vomiting., Disp: 20 tablet, Rfl: 0   pantoprazole (PROTONIX) 40 MG tablet, TAKE 1 TABLET BY MOUTH TWICE A DAY, Disp: 180 tablet, Rfl: 3   RESTASIS 0.05 % ophthalmic emulsion, Place 1 drop into both eyes 2 (two) times daily. , Disp: , Rfl:    sacubitril-valsartan (ENTRESTO) 24-26 MG, Take 1 tablet by mouth 2 (two) times daily., Disp: 60 tablet, Rfl: 11  tacrolimus (PROTOPIC) 0.1 % ointment, APPLY TO AFFECTED AREA ONCE A DAY, Disp: , Rfl:    tamsulosin (FLOMAX) 0.4 MG CAPS capsule, TAKE 1 CAPSULE (0.4 MG TOTAL) BY MOUTH DAILY AFTER SUPPER., Disp: 90 capsule, Rfl: 1   zolpidem (AMBIEN) 10 MG tablet, Take 10 mg by mouth at bedtime as needed for sleep., Disp: , Rfl:   Allergies  Allergen Reactions   Latex Other (See Comments)    Other   Tape     Use Paper Tape    Sulfa Antibiotics Rash    Objective:   There were no vitals taken for this visit.  Patient is well-developed, well-nourished in no acute distress.  Resting comfortably  at home.  Head is normocephalic, atraumatic.  No labored breathing.  Speech is clear and coherent with logical content.  Patient is alert and oriented at baseline.    Assessment and Plan:        [facility] in today with chief complaint of No chief complaint on file.   1. Nausea Stop doxycycline Start keflex in morning [place] diet Zofran as needed    The above assessment and management plan was discussed with the patient. The patient verbalized understanding of and has agreed to the management plan. Patient is aware to call the clinic if symptoms persist or worsen. Patient is aware when to return to the clinic for a follow-up visit. Patient educated on  when it is appropriate to go to the emergency department.   [place], FNP  .   [place], FNP 08/25/2020  Time spent with the patient: 20 minutes, of which >50% was spent in obtaining information about symptoms, reviewing previous labs, evaluations, and treatments, counseling about condition (please see the discussed topics above), and developing a plan to further investigate it; had a number of questions which I addressed.

## 2020-08-25 NOTE — ED Provider Notes (Signed)
[place]    CSN: [id number] Arrival date & time: 08/25/20  1009      History   Chief Complaint Chief Complaint  Patient presents with   Nausea    HPI Peter Snyder is a 65 y.o. male.   HPI  Nausea: Patient presents with nausea since Thursday.  He was started on doxycycline for a boil of his buttock.  He states that he is taking the medication as directed and that the boil does seem to be improving.  He denies any abdominal pain, vomiting or diarrhea.  He has taken a very old Zofran which has helped some.   Past Medical History:  Diagnosis Date   Allergy    Arthritis    CAD (coronary artery disease) 6/14   Cardiac catheterization 6/27/ 2014 ejection fraction 35-40%, 30% proximal left circumflex, 100% tiny obtuse marginal 1 with collaterals, 50% LAD, 50% D1, 100% RCA with collaterals   Chicken pox    Chronic back pain    Colon polyps    Depression    Diabetes mellitus ([place])    Diet controlled- OFF metformin 02-01-20   GERD (gastroesophageal reflux disease)    Hyperlipidemia    Hypertension    Kidney stone    Myocardial infarction [facility])    ? year- cath 2014/ june     Patient Active Problem List   Diagnosis Date Noted   Allergic rhinitis 08/03/2020   Type 2 diabetes mellitus with hyperglycemia ([place]) 07/11/2019   Pes anserinus bursitis of left knee 05/20/2018   Pain in left knee 02/26/2018   DDD (degenerative disc disease), cervical 06/02/2017   Degeneration of lumbar intervertebral disc 05/25/2017   Acquired trigger finger of left middle finger 05/08/2017   Pain in finger of left hand 05/08/2017   Trigger finger 03/28/2017   Viral URI 03/16/2015   Gastroenteritis 04/03/2014   Chronic insomnia 03/14/2014   Peripheral neuropathy 09/02/2013   Cardiomyopathy, ischemic 01/18/2013   CAD (coronary artery disease) 12/29/2012   GERD (gastroesophageal reflux disease) 12/29/2012   Essential hypertension, benign 12/29/2012   Dyslipidemia 12/29/2012    Personal history of colonic polyps 12/29/2012   Kidney stones 12/29/2012   Depression 12/29/2012   Degenerative arthritis of lumbar spine 12/29/2012   BPH (benign prostatic hyperplasia) 12/29/2012   Essential tremor 12/29/2012   Prediabetes 12/29/2012    Past Surgical History:  Procedure Laterality Date   COLONOSCOPY     CRYO INTERCOSTAL NERVE BLOCK     double sacrum nerve block   epidural injections     multiple- L5 S1 and cervical neck pain issues as well    POLYPECTOMY     [place]  2019       Home Medications    Prior to Admission medications   Medication Sig Start Date End Date Taking? Authorizing Provider  acetaminophen (TYLENOL) 500 MG tablet Take 500 mg by mouth every 6 (six) hours as needed for moderate pain.    [provider]  ALPRAZolam Duanne Moron) 0.25 MG tablet Take 0.25 mg by mouth daily.    [provider]  aspirin 81 MG tablet Take 81 mg by mouth daily.    [provider]  atorvastatin (LIPITOR) 80 MG tablet TAKE 1 TABLET BY MOUTH EVERY DAY 07/09/20   Burchette, Alinda Sierras, MD  azelastine (ASTELIN) 0.1 % nasal spray PLACE 1 SPRAY INTO BOTH NOSTRILS 2 (TWO) TIMES DAILY AS DIRECTED 07/02/20   Burchette, Alinda Sierras, MD  carvedilol (  COREG) 12.5 MG tablet TAKE 1 TABLET (12.5 MG TOTAL) BY MOUTH 2 (TWO) TIMES DAILY WITH A MEAL. 07/02/20   Lelon Perla, MD  celecoxib (CELEBREX) 200 MG capsule Take 200 mg by mouth daily.     [provider]  clonazePAM (KLONOPIN) 0.5 MG tablet Take 0.5 mg by mouth at bedtime.     [provider]  diclofenac Sodium (VOLTAREN) 1 % GEL diclofenac 1 % topical gel    [provider]  doxycycline (VIBRA-TABS) 100 MG tablet Take 1 tablet (100 mg total) by mouth 2 (two) times daily. 08/23/20   Maximiano Coss, NP  escitalopram (LEXAPRO) 20 MG tablet Take 20 mg by mouth daily.    [provider]  ezetimibe (ZETIA) 10 MG tablet TAKE 1 TABLET BY MOUTH EVERY  DAY 04/16/20   Burchette, Alinda Sierras, MD  fluticasone (FLONASE) 50 MCG/ACT nasal spray USE 2 SPRAYS IN EACH NOSTRIL EVERY DAY 02/13/20   Burchette, Alinda Sierras, MD  gabapentin (NEURONTIN) 600 MG tablet TAKE 1 TABLET BY MOUTH TWICE A DAY 04/16/20   Burchette, Alinda Sierras, MD  LOTEMAX 0.5 % GEL SMARTSIG:1 Drop(s) In Eye(s) Twice Daily PRN 12/06/18   [provider]  mupirocin cream (BACTROBAN) 2 % Apply 1 application topically 2 (two) times daily. 08/23/20   Maximiano Coss, NP  pantoprazole (PROTONIX) 40 MG tablet TAKE 1 TABLET BY MOUTH TWICE A DAY 04/16/20   Burchette, Alinda Sierras, MD  RESTASIS 0.05 % ophthalmic emulsion Place 1 drop into both eyes 2 (two) times daily.  11/20/12   [provider]  sacubitril-valsartan (ENTRESTO) 24-26 MG Take 1 tablet by mouth 2 (two) times daily. 08/20/20   Lelon Perla, MD  tacrolimus (PROTOPIC) 0.1 % ointment APPLY TO AFFECTED AREA ONCE A DAY 01/13/19   [provider]  tamsulosin (FLOMAX) 0.4 MG CAPS capsule TAKE 1 CAPSULE (0.4 MG TOTAL) BY MOUTH DAILY AFTER SUPPER. 08/17/20   Burchette, Alinda Sierras, MD  zolpidem (AMBIEN) 10 MG tablet Take 10 mg by mouth at bedtime as needed for sleep.    [provider]    Family History Family History  Problem Relation Age of Onset   Melanoma Mother        metastatic   Cancer Mother        melanoma   Hypertension Mother    Arthritis Father    Colon polyps Father    COPD Father    Hyperlipidemia Maternal Grandmother    Heart disease Maternal Grandmother    Arthritis Paternal Grandmother    Clotting disorder Paternal Grandmother    Clotting disorder Paternal Grandfather    Liver cancer Paternal Grandfather    Colon cancer Neg Hx    Esophageal cancer Neg Hx    Rectal cancer Neg Hx    Stomach cancer Neg Hx     Social History Social History   Tobacco Use   Smoking status: Former    Packs/day: 0.25    Years: 28.00    Pack years: 7.00    Types: Cigarettes    Start date: 82    Quit date: 2000     Years since quitting: 22.4   Smokeless tobacco: Never  Vaping Use   Vaping Use: Never used  Substance Use Topics   Alcohol use: No    Alcohol/week: 0.0 standard drinks   Drug use: No     Allergies   Latex, Tape, and Sulfa antibiotics   Review of Systems Review of Systems  As stated  above in HPI Physical Exam Triage Vital Signs ED Triage Vitals  Enc Vitals Group     BP 08/25/20 1052 100/68     Pulse Rate 08/25/20 1052 85     Resp 08/25/20 1052 16     Temp 08/25/20 1052 98.8 F (37.1 C)     Temp Source 08/25/20 1052 Oral     SpO2 08/25/20 1052 97 %     Weight --      Height --      Head Circumference --      Peak Flow --      Pain Score 08/25/20 1053 0     Pain Loc --      Pain Edu? --      Excl. in [place]? --    No data found.  Updated Vital Signs BP 100/68 (BP Location: Right Arm)   Pulse 85   Temp 98.8 F (37.1 C) (Oral)   Resp 16   SpO2 97%   Physical Exam Vitals and nursing note reviewed.  Constitutional:      General: He is not in acute distress.    Appearance: Normal appearance. He is not ill-appearing, toxic-appearing or diaphoretic.  Cardiovascular:     Rate and Rhythm: Normal rate and regular rhythm.     Heart sounds: Normal heart sounds.  Pulmonary:     Effort: Pulmonary effort is normal.     Breath sounds: Normal breath sounds.  Abdominal:     General: Bowel sounds are normal. There is no distension.     Palpations: Abdomen is soft. There is no mass.     Tenderness: There is no abdominal tenderness. There is no guarding or rebound.     Hernia: No hernia is present.  Skin:    Comments: 1 cm erythematic resolution of boil of the right buttock  Neurological:     Mental Status: He is alert.     UC Treatments / Results  Labs (all labs ordered are listed, but only abnormal results are displayed) Labs Reviewed - No data to display  EKG   Radiology No results found.  Procedures Procedures (including critical care  time)  Medications Ordered in UC Medications - No data to display  Initial Impression / Assessment and Plan / UC Course  I have reviewed the triage vital signs and the nursing notes.  Pertinent labs & imaging results that were available during my care of the patient were reviewed by me and considered in my medical decision making (see chart for details).    New.  Discussed medication side effects with patient.  Refilling his Zofran so he can use newer Zofran which should be more beneficial to his symptoms.  Discussed promethazine however with the side effect risk profile he elects to forego this.  We reviewed common potential side effects and precautions with doxycycline and that I have encouraged him to continue this medication. Discussed red flag signs and symptoms Final Clinical Impressions(s) / UC Diagnoses   Final diagnoses:  None   Discharge Instructions   None    ED Prescriptions   None    PDMP not reviewed this encounter.   Hughie Closs, [place] 08/25/20 1122

## 2020-08-25 NOTE — ED Triage Notes (Signed)
Pt said he went onThursday to dr for boil on bottom and was placed on antibiotics and then this morning felt nauseated. No fevers,

## 2020-08-27 ENCOUNTER — Ambulatory Visit (INDEPENDENT_AMBULATORY_CARE_PROVIDER_SITE_OTHER): Payer: Medicare Other | Admitting: Family Medicine

## 2020-08-27 ENCOUNTER — Other Ambulatory Visit: Payer: Self-pay

## 2020-08-27 ENCOUNTER — Encounter: Payer: Self-pay | Admitting: Family Medicine

## 2020-08-27 VITALS — BP 90/60 | HR 87 | Temp 97.7°F | Wt 218.5 lb

## 2020-08-27 DIAGNOSIS — R110 Nausea: Secondary | ICD-10-CM | POA: Diagnosis not present

## 2020-08-27 DIAGNOSIS — I959 Hypotension, unspecified: Secondary | ICD-10-CM

## 2020-08-27 DIAGNOSIS — L089 Local infection of the skin and subcutaneous tissue, unspecified: Secondary | ICD-10-CM | POA: Diagnosis not present

## 2020-08-27 DIAGNOSIS — I255 Ischemic cardiomyopathy: Secondary | ICD-10-CM | POA: Diagnosis not present

## 2020-08-27 NOTE — Progress Notes (Signed)
Established Patient Office Visit  Subjective:  Patient ID: Peter Snyder, male    DOB: 1956/02/26  Age: 65 y.o. MRN: [id number]  CC:  Chief Complaint  Patient presents with   Nausea    Seen at [facility] for a boil and given doxycycline, sat had bad nausea but no vomiting, seen in ED and given Zofran but did not help, stopped doxycycline saturday, still feels nauseous and weak    HPI [facility] presents for recent "boil "on his right buttock.  He was seen in another clinic last Thursday.  He was placed on doxycycline but had severe nausea and unable to eat.  Was not getting relief with Zofran.  He ended up going to the ER and was given a new prescription for Zofran.  He then did another virtual visit later Saturday and was given Keflex but never started that.  He states that the swollen pustular area right buttock did start draining some and actually feels better today.  He has done some warm water soaks.  Still has some generalized weakness and mild queasiness but no vomiting.  He has been some eating at this time.  Blood pressure low today.  He takes carvedilol at baseline along with lisinopril 20 mg daily.  Has already taken lisinopril today.  Is drinking fluids.  No orthostatic symptoms.  Past Medical History:  Diagnosis Date   Allergy    Arthritis    CAD (coronary artery disease) 6/14   Cardiac catheterization 6/27/ 2014 ejection fraction 35-40%, 30% proximal left circumflex, 100% tiny obtuse marginal 1 with collaterals, 50% LAD, 50% D1, 100% RCA with collaterals   Chicken pox    Chronic back pain    Colon polyps    Depression    Diabetes mellitus ([place])    Diet controlled- OFF metformin 02-01-20   GERD (gastroesophageal reflux disease)    Hyperlipidemia    Hypertension    Kidney stone    Myocardial infarction [facility])    ? year- cath 2014/ june     Past Surgical History:  Procedure Laterality Date   COLONOSCOPY     CRYO INTERCOSTAL NERVE BLOCK     double sacrum nerve  block   epidural injections     multiple- L5 S1 and cervical neck pain issues as well    POLYPECTOMY     TONSILLECTOMY  1963   TRIGGER FINGER RELEASE  2019    Family History  Problem Relation Age of Onset   Melanoma Mother        metastatic   Cancer Mother        melanoma   Hypertension Mother    Arthritis Father    Colon polyps Father    COPD Father    Hyperlipidemia Maternal Grandmother    Heart disease Maternal Grandmother    Arthritis Paternal Grandmother    Clotting disorder Paternal Grandmother    Clotting disorder Paternal Grandfather    Liver cancer Paternal Grandfather    Colon cancer Neg Hx    Esophageal cancer Neg Hx    Rectal cancer Neg Hx    Stomach cancer Neg Hx     Social History   Socioeconomic History   Marital status: Married    Spouse name: Not on file   Number of children: 3   Years of education: Not on file   Highest education level: Not on file  Occupational History   Occupation: retired    Comment: Retired  Tobacco Use   Smoking status: Former  Packs/day: 0.25    Years: 28.00    Pack years: 7.00    Types: Cigarettes    Start date: 44    Quit date: 2000    Years since quitting: 22.4   Smokeless tobacco: Never  Vaping Use   Vaping Use: Never used  Substance and Sexual Activity   Alcohol use: No    Alcohol/week: 0.0 standard drinks   Drug use: No   Sexual activity: Not on file  Other Topics Concern   Not on file  Social History Narrative   Not on file   Social Determinants of Health   Financial Resource Strain: Not on file  Food Insecurity: Not on file  Transportation Needs: Not on file  Physical Activity: Not on file  Stress: Not on file  Social Connections: Not on file  Intimate Partner Violence: Not on file    Outpatient Medications Prior to Visit  Medication Sig Dispense Refill   acetaminophen (TYLENOL) 500 MG tablet Take 500 mg by mouth every 6 (six) hours as needed for moderate pain.     ALPRAZolam (XANAX) 0.25  MG tablet Take 0.25 mg by mouth daily.     aspirin 81 MG tablet Take 81 mg by mouth daily.     atorvastatin (LIPITOR) 80 MG tablet TAKE 1 TABLET BY MOUTH EVERY DAY 90 tablet 1   Azelastine HCl 137 MCG/SPRAY SOLN PLACE 1 SPRAY INTO BOTH NOSTRILS 2 (TWO) TIMES DAILY AS DIRECTED 30 mL 1   carvedilol (COREG) 12.5 MG tablet TAKE 1 TABLET (12.5 MG TOTAL) BY MOUTH 2 (TWO) TIMES DAILY WITH A MEAL. 180 tablet 0   celecoxib (CELEBREX) 200 MG capsule Take 200 mg by mouth daily.      clonazePAM (KLONOPIN) 0.5 MG tablet Take 0.5 mg by mouth at bedtime.      diclofenac Sodium (VOLTAREN) 1 % GEL diclofenac 1 % topical gel     escitalopram (LEXAPRO) 20 MG tablet Take 20 mg by mouth daily.     ezetimibe (ZETIA) 10 MG tablet TAKE 1 TABLET BY MOUTH EVERY DAY 90 tablet 1   fluticasone (FLONASE) 50 MCG/ACT nasal spray USE 2 SPRAYS IN EACH NOSTRIL EVERY DAY 48 mL 2   gabapentin (NEURONTIN) 600 MG tablet TAKE 1 TABLET BY MOUTH TWICE A DAY 180 tablet 1   LOTEMAX 0.5 % GEL SMARTSIG:1 Drop(s) In Eye(s) Twice Daily PRN     mupirocin cream (BACTROBAN) 2 % Apply 1 application topically 2 (two) times daily. 15 g 0   ondansetron (ZOFRAN ODT) 4 MG disintegrating tablet Take 1 tablet (4 mg total) by mouth every 8 (eight) hours as needed for nausea or vomiting. 20 tablet 0   pantoprazole (PROTONIX) 40 MG tablet TAKE 1 TABLET BY MOUTH TWICE A DAY 180 tablet 3   RESTASIS 0.05 % ophthalmic emulsion Place 1 drop into both eyes 2 (two) times daily.      sacubitril-valsartan (ENTRESTO) 24-26 MG Take 1 tablet by mouth 2 (two) times daily. 60 tablet 11   tacrolimus (PROTOPIC) 0.1 % ointment APPLY TO AFFECTED AREA ONCE A DAY     tamsulosin (FLOMAX) 0.4 MG CAPS capsule TAKE 1 CAPSULE (0.4 MG TOTAL) BY MOUTH DAILY AFTER SUPPER. 90 capsule 1   zolpidem (AMBIEN) 10 MG tablet Take 10 mg by mouth at bedtime as needed for sleep.     cephALEXin (KEFLEX) 500 MG capsule Take 1 capsule (500 mg total) by mouth 3 (three) times daily. 21 capsule 0    doxycycline (VIBRA-TABS) 100  MG tablet Take 1 tablet (100 mg total) by mouth 2 (two) times daily. 14 tablet 0   No facility-administered medications prior to visit.    Allergies  Allergen Reactions   Latex Other (See Comments)    Other   Tape     Use Paper Tape    Sulfa Antibiotics Rash    ROS Review of Systems  Constitutional:  Negative for chills and fever.  Neurological:  Positive for weakness. Negative for dizziness and syncope.     Objective:      BP 90/60 (BP Location: Left Arm, Patient Position: Sitting, Cuff Size: Normal)   Pulse 87   Temp 97.7 F (36.5 C) (Oral)   Wt 218 lb 8 oz (99.1 kg)   SpO2 98%   BMI 28.05 kg/m  Wt Readings from Last 3 Encounters:  08/27/20 218 lb 8 oz (99.1 kg)  08/23/20 221 lb (100.2 kg)  08/20/20 219 lb 9.6 oz (99.6 kg)     Health Maintenance Due  Topic Date Due   OPHTHALMOLOGY EXAM  07/28/2020    There are no preventive care reminders to display for this patient.  Lab Results  Component Value Date   TSH 1.62 03/30/2020   Lab Results  Component Value Date   WBC 4.1 03/30/2020   HGB 13.0 03/30/2020   HCT 38.4 (L) 03/30/2020   MCV 90.6 03/30/2020   PLT 173.0 03/30/2020   Lab Results  Component Value Date   NA 140 03/30/2020   K 4.8 03/30/2020   CO2 31 03/30/2020   GLUCOSE 122 (H) 03/30/2020   BUN 15 03/30/2020   CREATININE 1.14 03/30/2020   BILITOT 0.5 03/30/2020   ALKPHOS 56 03/30/2020   AST 20 03/30/2020   ALT 20 03/30/2020   PROT 6.8 03/30/2020   ALBUMIN 4.8 03/30/2020   CALCIUM 9.6 03/30/2020   ANIONGAP 7 04/03/2014   GFR 67.79 03/30/2020   Lab Results  Component Value Date   CHOL 146 03/30/2020   Lab Results  Component Value Date   HDL 49.10 03/30/2020   Lab Results  Component Value Date   LDLCALC 72 03/30/2020   Lab Results  Component Value Date   TRIG 128.0 03/30/2020   Lab Results  Component Value Date   CHOLHDL 3 03/30/2020   Lab Results  Component Value Date   HGBA1C 6.2 (A)  08/08/2020      Assessment & Plan:   Pustular area right buttock which is resolving.  No cellulitis changes currently.  No indication for I&D.  He had recent intolerance with doxycycline  -Do not recommend any further antibiotics at this time -Continue warm sits water baths soaks -Follow-up for any recurrent pain, swelling, or erythema  -His blood pressure was slightly low today but no orthostatic symptoms.  Standing blood pressure did not drop much compared to seated.  Increase fluids and continue monitoring closely.  If systolic tomorrow 90 or below recommend holding his lisinopril until the systolic is above 295.  No orders of the defined types were placed in this encounter.   Follow-up: No follow-ups on file.    Carolann Littler, MD

## 2020-08-27 NOTE — Patient Instructions (Signed)
Your BP is slightly low today- 90/60 and standing 84/58.  Increase fluids may need to hold Lisinopril for a couple of days until BP systolic > 173.

## 2020-08-28 ENCOUNTER — Encounter: Payer: Self-pay | Admitting: Family Medicine

## 2020-08-29 NOTE — Telephone Encounter (Signed)
Patient called back and stated that he is not feeling any better and still feels bloated. Pt is requesting a call back, please advise. CB is [phone]

## 2020-08-30 ENCOUNTER — Encounter (HOSPITAL_COMMUNITY): Payer: Self-pay

## 2020-08-30 ENCOUNTER — Encounter: Payer: Self-pay | Admitting: Family Medicine

## 2020-08-30 ENCOUNTER — Ambulatory Visit (HOSPITAL_COMMUNITY)
Admission: EM | Admit: 2020-08-30 | Discharge: 2020-08-30 | Disposition: A | Discharge status: Home / Self Care | Payer: Medicare Other

## 2020-08-30 ENCOUNTER — Other Ambulatory Visit: Payer: Self-pay

## 2020-08-30 DIAGNOSIS — R110 Nausea: Secondary | ICD-10-CM | POA: Diagnosis not present

## 2020-08-30 NOTE — ED Triage Notes (Signed)
Pt presents with nausea and abdominal pain X  5 days. He states he has not been able to get rid of the rumbling in his stomach.  Pt states he might be allergic to Doxycycline.

## 2020-08-30 NOTE — ED Provider Notes (Signed)
Newborn   MRN: [id number] DOB: [birth year]  Subjective:   Malvern Kadlec is a 65 y.o. male presenting for 5-day history of acute onset persistent nausea, upset stomach, intermittent mild lower abdominal discomfort.  Patient has been taking doxycycline, stopped it a couple of days ago at the advice of his regular doctor.  Has been using multiple over-the-counter medications for his GI symptoms except for Zofran.  Denies fever chest pain vomiting hematemesis, bloody stools.  No current facility-administered medications for this encounter.  Current Outpatient Medications:    acetaminophen (TYLENOL) 500 MG tablet, Take 500 mg by mouth every 6 (six) hours as needed for moderate pain., Disp: , Rfl:    ALPRAZolam (XANAX) 0.25 MG tablet, Take 0.25 mg by mouth daily., Disp: , Rfl:    aspirin 81 MG tablet, Take 81 mg by mouth daily., Disp: , Rfl:    atorvastatin (LIPITOR) 80 MG tablet, TAKE 1 TABLET BY MOUTH EVERY DAY, Disp: 90 tablet, Rfl: 1   Azelastine HCl 137 MCG/SPRAY SOLN, PLACE 1 SPRAY INTO BOTH NOSTRILS 2 (TWO) TIMES DAILY AS DIRECTED, Disp: 30 mL, Rfl: 1   carvedilol (COREG) 12.5 MG tablet, TAKE 1 TABLET (12.5 MG TOTAL) BY MOUTH 2 (TWO) TIMES DAILY WITH A MEAL., Disp: 180 tablet, Rfl: 0   celecoxib (CELEBREX) 200 MG capsule, Take 200 mg by mouth daily. , Disp: , Rfl:    clonazePAM (KLONOPIN) 0.5 MG tablet, Take 0.5 mg by mouth at bedtime. , Disp: , Rfl:    diclofenac Sodium (VOLTAREN) 1 % GEL, diclofenac 1 % topical gel, Disp: , Rfl:    escitalopram (LEXAPRO) 20 MG tablet, Take 20 mg by mouth daily., Disp: , Rfl:    ezetimibe (ZETIA) 10 MG tablet, TAKE 1 TABLET BY MOUTH EVERY DAY, Disp: 90 tablet, Rfl: 1   fluticasone (FLONASE) 50 MCG/ACT nasal spray, USE 2 SPRAYS IN EACH NOSTRIL EVERY DAY, Disp: 48 mL, Rfl: 2   gabapentin (NEURONTIN) 600 MG tablet, TAKE 1 TABLET BY MOUTH TWICE A DAY, Disp: 180 tablet, Rfl: 1   LOTEMAX 0.5 % GEL, SMARTSIG:1 Drop(s) In Eye(s) Twice Daily  PRN, Disp: , Rfl:    mupirocin cream (BACTROBAN) 2 %, Apply 1 application topically 2 (two) times daily., Disp: 15 g, Rfl: 0   ondansetron (ZOFRAN ODT) 4 MG disintegrating tablet, Take 1 tablet (4 mg total) by mouth every 8 (eight) hours as needed for nausea or vomiting., Disp: 20 tablet, Rfl: 0   pantoprazole (PROTONIX) 40 MG tablet, TAKE 1 TABLET BY MOUTH TWICE A DAY, Disp: 180 tablet, Rfl: 3   RESTASIS 0.05 % ophthalmic emulsion, Place 1 drop into both eyes 2 (two) times daily. , Disp: , Rfl:    sacubitril-valsartan (ENTRESTO) 24-26 MG, Take 1 tablet by mouth 2 (two) times daily., Disp: 60 tablet, Rfl: 11   tacrolimus (PROTOPIC) 0.1 % ointment, APPLY TO AFFECTED AREA ONCE A DAY, Disp: , Rfl:    tamsulosin (FLOMAX) 0.4 MG CAPS capsule, TAKE 1 CAPSULE (0.4 MG TOTAL) BY MOUTH DAILY AFTER SUPPER., Disp: 90 capsule, Rfl: 1   zolpidem (AMBIEN) 10 MG tablet, Take 10 mg by mouth at bedtime as needed for sleep., Disp: , Rfl:    Allergies  Allergen Reactions   Latex Other (See Comments)    Other   Tape     Use Paper Tape    Sulfa Antibiotics Rash    Past Medical History:  Diagnosis Date   Allergy    Arthritis  CAD (coronary artery disease) 6/14   Cardiac catheterization 6/27/ 2014 ejection fraction 35-40%, 30% proximal left circumflex, 100% tiny obtuse marginal 1 with collaterals, 50% LAD, 50% D1, 100% RCA with collaterals   Chicken pox    Chronic back pain    Colon polyps    Depression    Diabetes mellitus ([place])    Diet controlled- OFF metformin 02-01-20   GERD (gastroesophageal reflux disease)    Hyperlipidemia    Hypertension    Kidney stone    Myocardial infarction [facility])    ? year- cath 2014/ june      Past Surgical History:  Procedure Laterality Date   COLONOSCOPY     CRYO INTERCOSTAL NERVE BLOCK     double sacrum nerve block   epidural injections     multiple- L5 S1 and cervical neck pain issues as well    POLYPECTOMY     TONSILLECTOMY  1963   TRIGGER FINGER RELEASE   2019    Family History  Problem Relation Age of Onset   Melanoma Mother        metastatic   Cancer Mother        melanoma   Hypertension Mother    Arthritis Father    Colon polyps Father    COPD Father    Hyperlipidemia Maternal Grandmother    Heart disease Maternal Grandmother    Arthritis Paternal Grandmother    Clotting disorder Paternal Grandmother    Clotting disorder Paternal Grandfather    Liver cancer Paternal Grandfather    Colon cancer Neg Hx    Esophageal cancer Neg Hx    Rectal cancer Neg Hx    Stomach cancer Neg Hx     Social History   Tobacco Use   Smoking status: Former    Packs/day: 0.25    Years: 28.00    Pack years: 7.00    Types: Cigarettes    Start date: 74    Quit date: 2000    Years since quitting: 22.4   Smokeless tobacco: Never  Vaping Use   Vaping Use: Never used  Substance Use Topics   Alcohol use: No    Alcohol/week: 0.0 standard drinks   Drug use: No    ROS   Objective:   Vitals: BP (!) 143/75 (BP Location: Right Arm)   Pulse 66   Temp 98.7 F (37.1 C) (Oral)   Resp 17   SpO2 100%   Physical Exam Constitutional:      General: He is not in acute distress.    Appearance: Normal appearance. He is well-developed. He is not ill-appearing, toxic-appearing or diaphoretic.  HENT:     Head: Normocephalic and atraumatic.     Right Ear: External ear normal.     Left Ear: External ear normal.     Nose: Nose normal.     Mouth/Throat:     Mouth: Mucous membranes are moist.     Pharynx: Oropharynx is clear.  Eyes:     General: No scleral icterus.    Extraocular Movements: Extraocular movements intact.     Pupils: Pupils are equal, round, and reactive to light.  Cardiovascular:     Rate and Rhythm: Normal rate and regular rhythm.     Heart sounds: Normal heart sounds. No murmur heard.   No friction rub. No gallop.  Pulmonary:     Effort: Pulmonary effort is normal. No respiratory distress.     Breath sounds: Normal breath  sounds. No stridor. No wheezing, rhonchi  or rales.  Abdominal:     General: Bowel sounds are increased. There is no distension.     Palpations: Abdomen is soft. There is no mass.     Tenderness: There is no abdominal tenderness. There is no guarding or rebound.  Skin:    General: Skin is warm and dry.  Neurological:     Mental Status: He is alert and oriented to person, place, and time.  Psychiatric:        Mood and Affect: Mood normal.        Behavior: Behavior normal.        Thought Content: Thought content normal.      Assessment and Plan :   PDMP not reviewed this encounter.  1. Nausea    Suspect adverse effect of intake and doxycycline.  Recommended being consistent with Zofran, eating light meals which she has not done.  Continue to hydrate well, use supportive care.  Follow-up with PCP. Counseled patient on potential for adverse effects with medications prescribed/recommended today, ER and return-to-clinic precautions discussed, patient verbalized understanding.     Jaynee Eagles, PA-C 08/30/20 1719

## 2020-09-03 MED ORDER — LISINOPRIL 10 MG PO TABS: 10.0000 mg | ORAL_TABLET | Freq: Every day | ORAL | 3 refills | Status: DC

## 2020-09-10 ENCOUNTER — Other Ambulatory Visit: Payer: Self-pay | Admitting: Family Medicine

## 2020-09-27 ENCOUNTER — Encounter: Payer: Self-pay | Admitting: Family Medicine

## 2020-10-10 ENCOUNTER — Encounter: Payer: Self-pay | Admitting: Family Medicine

## 2020-10-10 NOTE — Telephone Encounter (Signed)
Spoke with him regarding COVID issues.  Her symptoms are extremely mild at this time and she has no fever or dyspnea.  Sent in refill of Tessalon Perles 100 mg every 8 hours as needed for cough.  We decided to hold on antiviral therapy at this time since her symptoms are so mild and she has relatively low risk.  Reviewed CDC guidelines for isolation

## 2020-10-11 ENCOUNTER — Encounter: Payer: Self-pay | Admitting: Family Medicine

## 2020-10-15 ENCOUNTER — Other Ambulatory Visit: Payer: Self-pay | Admitting: Family Medicine

## 2020-10-15 ENCOUNTER — Encounter: Payer: Self-pay | Admitting: Family Medicine

## 2020-11-04 ENCOUNTER — Other Ambulatory Visit: Payer: Self-pay | Admitting: Family Medicine

## 2020-11-09 ENCOUNTER — Other Ambulatory Visit: Payer: Self-pay | Admitting: Family Medicine

## 2020-11-12 ENCOUNTER — Other Ambulatory Visit: Payer: Self-pay | Admitting: Family Medicine

## 2020-11-12 ENCOUNTER — Ambulatory Visit (INDEPENDENT_AMBULATORY_CARE_PROVIDER_SITE_OTHER): Payer: Medicare Other | Admitting: Family Medicine

## 2020-11-12 ENCOUNTER — Other Ambulatory Visit: Payer: Self-pay

## 2020-11-12 ENCOUNTER — Encounter: Payer: Self-pay | Admitting: Family Medicine

## 2020-11-12 VITALS — BP 100/70 | HR 64 | Temp 97.7°F | Resp 16 | Ht 74.0 in | Wt 218.0 lb

## 2020-11-12 DIAGNOSIS — R0981 Nasal congestion: Secondary | ICD-10-CM | POA: Diagnosis not present

## 2020-11-12 DIAGNOSIS — I255 Ischemic cardiomyopathy: Secondary | ICD-10-CM | POA: Diagnosis not present

## 2020-11-12 DIAGNOSIS — I5020 Unspecified systolic (congestive) heart failure: Secondary | ICD-10-CM

## 2020-11-12 MED ORDER — MOMETASONE FUROATE 50 MCG/ACT NA SUSP: 2.0000 | Freq: Every day | NASAL | 0 refills | Status: DC

## 2020-11-12 NOTE — Patient Instructions (Signed)
A few things to remember from today's visit:  Nasal congestion - Plan: mometasone (NASONEX) 50 MCG/ACT nasal spray  Flonase nasal spray stopped today and Nasonex  nasal spray started. Caution with Afrin. May need ENT evaluation if not better.  If you need refills please call your pharmacy. Do not use My Chart to request refills or for acute issues that need immediate attention.    Please be sure medication list is accurate. If a new problem present, please set up appointment sooner than planned today.

## 2020-11-12 NOTE — Progress Notes (Signed)
ACUTE VISIT Chief Complaint  Patient presents with   Nasal Congestion    Over the weekend, negative covid test, has seen an ENT in the past. Uses flonase regularly. Has been using saline spray and afrin spray.   HPI: Mr.Sabastion Fulco is a 65 y.o. male, who is here today complaining of nasal congestion as described above. Problem started after starting oral abx treatment,recommended by his dermatologist to treat E Coli scalp skin infection, resolved. Negative for fever,chills,sinus pain,rhinorrhea,or cough. No anosmia or ageusia.  Netti pot yesterday, a lot of mucus came out, "cheesy" and spongy material. This has helped with symptoms. He  He is currently on Flonase nasal spray, Astelin nasal spray,and saline. No sick constant or recent travel.  I asked about hx of heart murmur because I though I heard a very soft one on RUSB. CAD, follows with cardio. Last echo in 09/2018 with LVEF 55% but according to pt, echo was reviewed and LVEF was 40-45%. Planning on starting on entresto. Negative for CP,orthopnea,PND,or edema.  Review of Systems  Constitutional:  Negative for activity change and appetite change.  HENT:  Positive for congestion. Negative for ear pain, mouth sores, sneezing, sore throat, trouble swallowing and voice change.   Eyes:  Negative for discharge, redness and itching.  Respiratory:  Negative for shortness of breath and wheezing.   Gastrointestinal:  Negative for abdominal pain, nausea and vomiting.       No changes in bowel habits.  Musculoskeletal:  Negative for back pain and joint swelling.  Skin:  Negative for rash.  Neurological:  Negative for syncope, weakness and headaches.  Psychiatric/Behavioral:  Negative for confusion. The patient is nervous/anxious.   Rest see pertinent positives and negatives per HPI.  Current Outpatient Medications on File Prior to Visit  Medication Sig Dispense Refill   acetaminophen (TYLENOL) 500 MG tablet Take 500 mg by mouth  every 6 (six) hours as needed for moderate pain.     ALPRAZolam (XANAX) 0.25 MG tablet Take 0.25 mg by mouth daily.     aspirin 81 MG tablet Take 81 mg by mouth daily.     atorvastatin (LIPITOR) 80 MG tablet TAKE 1 TABLET BY MOUTH EVERY DAY 90 tablet 1   Azelastine HCl 137 MCG/SPRAY SOLN PLACE 1 SPRAY INTO BOTH NOSTRILS 2 (TWO) TIMES DAILY AS DIRECTED 30 mL 1   carvedilol (COREG) 12.5 MG tablet TAKE 1 TABLET TWICE A DAY WITH A MEAL 180 tablet 0   celecoxib (CELEBREX) 200 MG capsule Take 200 mg by mouth daily.      clonazePAM (KLONOPIN) 0.5 MG tablet Take 0.5 mg by mouth at bedtime.      diclofenac Sodium (VOLTAREN) 1 % GEL diclofenac 1 % topical gel     escitalopram (LEXAPRO) 20 MG tablet Take 20 mg by mouth daily.     ezetimibe (ZETIA) 10 MG tablet TAKE 1 TABLET BY MOUTH EVERY DAY 90 tablet 1   gabapentin (NEURONTIN) 600 MG tablet TAKE 1 TABLET BY MOUTH TWICE A DAY 180 tablet 1   lisinopril (ZESTRIL) 10 MG tablet Take 1 tablet (10 mg total) by mouth daily. 90 tablet 3   LOTEMAX 0.5 % GEL SMARTSIG:1 Drop(s) In Eye(s) Twice Daily PRN     mupirocin cream (BACTROBAN) 2 % Apply 1 application topically 2 (two) times daily. 15 g 0   ondansetron (ZOFRAN ODT) 4 MG disintegrating tablet Take 1 tablet (4 mg total) by mouth every 8 (eight) hours as needed for nausea or vomiting.  20 tablet 0   pantoprazole (PROTONIX) 40 MG tablet TAKE 1 TABLET BY MOUTH TWICE A DAY 180 tablet 3   RESTASIS 0.05 % ophthalmic emulsion Place 1 drop into both eyes 2 (two) times daily.      sacubitril-valsartan (ENTRESTO) 24-26 MG Take 1 tablet by mouth 2 (two) times daily. 60 tablet 11   tacrolimus (PROTOPIC) 0.1 % ointment APPLY TO AFFECTED AREA ONCE A DAY     tamsulosin (FLOMAX) 0.4 MG CAPS capsule TAKE 1 CAPSULE (0.4 MG TOTAL) BY MOUTH DAILY AFTER SUPPER. 90 capsule 1   zolpidem (AMBIEN) 10 MG tablet Take 10 mg by mouth at bedtime as needed for sleep.     No current facility-administered medications on file prior to visit.    Past Medical History:  Diagnosis Date   Allergy    Arthritis    CAD (coronary artery disease) 6/14   Cardiac catheterization 6/27/ 2014 ejection fraction 35-40%, 30% proximal left circumflex, 100% tiny obtuse marginal 1 with collaterals, 50% LAD, 50% D1, 100% RCA with collaterals   Chicken pox    Chronic back pain    Colon polyps    Depression    Diabetes mellitus ([place])    Diet controlled- OFF metformin 02-01-20   GERD (gastroesophageal reflux disease)    Hyperlipidemia    Hypertension    Kidney stone    Myocardial infarction [facility])    ? year- cath 2014/ june    Allergies  Allergen Reactions   Latex Other (See Comments)    Other   Tape     Use Paper Tape    Sulfa Antibiotics Rash    Social History   Socioeconomic History   Marital status: Married    Spouse name: Not on file   Number of children: 3   Years of education: Not on file   Highest education level: Not on file  Occupational History   Occupation: retired    Comment: Retired  Tobacco Use   Smoking status: Former    Packs/day: 0.25    Years: 28.00    Pack years: 7.00    Types: Cigarettes    Start date: 1972    Quit date: 2000    Years since quitting: 22.6   Smokeless tobacco: Never  Vaping Use   Vaping Use: Never used  Substance and Sexual Activity   Alcohol use: No    Alcohol/week: 0.0 standard drinks   Drug use: No   Sexual activity: Not on file  Other Topics Concern   Not on file  Social History Narrative   Not on file   Social Determinants of Health   Financial Resource Strain: Not on file  Food Insecurity: Not on file  Transportation Needs: Not on file  Physical Activity: Not on file  Stress: Not on file  Social Connections: Not on file    Vitals:   11/12/20 1056  BP: 100/70  Pulse: 64  Resp: 16  Temp: 97.7 F (36.5 C)  SpO2: 97%   Body mass index is 27.99 kg/m.  Physical Exam Vitals and nursing note reviewed.  Constitutional:      General: He is not in acute  distress.    Appearance: He is well-developed. He is not ill-appearing.  HENT:     Head: Atraumatic.     Right Ear: Tympanic membrane, ear canal and external ear normal.     Left Ear: Tympanic membrane, ear canal and external ear normal.     Nose: No rhinorrhea.  Right Turbinates: Enlarged. Not swollen.     Left Turbinates: Enlarged. Not swollen.     Right Sinus: No maxillary sinus tenderness or frontal sinus tenderness.     Left Sinus: No maxillary sinus tenderness or frontal sinus tenderness.     Comments: Crusty turbinates.    Mouth/Throat:     Mouth: Mucous membranes are moist.     Pharynx: Oropharynx is clear.  Eyes:     Conjunctiva/sclera: Conjunctivae normal.  Cardiovascular:     Rate and Rhythm: Normal rate and regular rhythm.     Heart sounds: No murmur (? soft SEM RUSB) heard. Pulmonary:     Effort: Pulmonary effort is normal. No respiratory distress.     Breath sounds: Normal breath sounds. No stridor.  Lymphadenopathy:     Head:     Right side of head: No submandibular adenopathy.     Left side of head: No submandibular adenopathy.     Cervical: No cervical adenopathy.  Skin:    General: Skin is warm.     Findings: No erythema or rash.  Neurological:     Mental Status: He is alert and oriented to person, place, and time.  Psychiatric:        Mood and Affect: Mood is anxious.     Comments: Well groomed, good eye contact.   ASSESSMENT AND PLAN:  Mr.Ed was seen today for nasal congestion.  Diagnoses and all orders for this visit:  Nasal congestion Hx and examination do not suggest a serious process. Flonase nasal spray does not seem to be helping, so recommend changing to Naspnex nasal spray.Some side effects discussed. Continue Astelin nasal spray 3-4 times prn and nasal saline irrigations as needed. Caution advice with Afrin. If not better in a couple week, he may need ENT evaluation.  -     mometasone (NASONEX) 50 MCG/ACT nasal spray; Place 2 sprays  into the nose daily.  HFrEF (heart failure with reduced ejection fraction) ([place]) We reviewed echo report. Euvolemic on examination today. On ACEI and BB. Reassured in regard to heart auscultation today. Following with cardiologist.  Return in about 2 weeks (around 11/26/2020).   Leba Tibbitts G. Martin[place], MD  [facility]. [place] office.

## 2020-11-13 ENCOUNTER — Ambulatory Visit (INDEPENDENT_AMBULATORY_CARE_PROVIDER_SITE_OTHER): Payer: Medicare Other | Admitting: Family Medicine

## 2020-11-13 ENCOUNTER — Encounter: Payer: Self-pay | Admitting: Family Medicine

## 2020-11-13 VITALS — BP 120/60 | HR 57 | Temp 97.7°F | Wt 216.8 lb

## 2020-11-13 DIAGNOSIS — I255 Ischemic cardiomyopathy: Secondary | ICD-10-CM | POA: Diagnosis not present

## 2020-11-13 DIAGNOSIS — I358 Other nonrheumatic aortic valve disorders: Secondary | ICD-10-CM

## 2020-11-13 NOTE — Patient Instructions (Signed)
Suspect murmur from some mild aortic insufficiency.  Follow up with Cardiology for any increased shortness of breath or dizziness.

## 2020-11-13 NOTE — Progress Notes (Signed)
Established Patient Office Visit  Subjective:  Patient ID: Peter Snyder, male    DOB: 1956/01/31  Age: 65 y.o. MRN: [id number]  CC:  Chief Complaint  Patient presents with   Follow-up    Follow up from visit with Dr. Martinique yesterday    HPI [facility] presents for follow-up from visit yesterday with one of my colleagues, Dr. Martinique.  Patient had presented with some nasal congestion.  He had noticed some discharge of greenish colored particulate substance which sound like very thickened mucus.  He uses multiple medications including Astelin and Flonase and also uses saline irrigation.  He states he could breathe better afterwards (after expelling the thickened substance).  Dr. Martinique thought she may have her subtle murmur right upper sternal border.  Patient has history of trivial aortic regurgitation from echo 7/20.  He also had recent MR angiogram of the chest in March of this year with 4.3 cm ascending aortic aneurysm which had previously been 4.2.  Patient walks about 3 miles per day including several hills is walking with no difficulty.  No dizziness.  No dyspnea.  No chest pains.  Past Medical History:  Diagnosis Date   Allergy    Arthritis    CAD (coronary artery disease) 6/14   Cardiac catheterization 6/27/ 2014 ejection fraction 35-40%, 30% proximal left circumflex, 100% tiny obtuse marginal 1 with collaterals, 50% LAD, 50% D1, 100% RCA with collaterals   Chicken pox    Chronic back pain    Colon polyps    Depression    Diabetes mellitus ([place])    Diet controlled- OFF metformin 02-01-20   GERD (gastroesophageal reflux disease)    Hyperlipidemia    Hypertension    Kidney stone    Myocardial infarction [facility])    ? year- cath 2014/ june     Past Surgical History:  Procedure Laterality Date   COLONOSCOPY     CRYO INTERCOSTAL NERVE BLOCK     double sacrum nerve block   epidural injections     multiple- L5 S1 and cervical neck pain issues as well    POLYPECTOMY      TONSILLECTOMY  1963   TRIGGER FINGER RELEASE  2019    Family History  Problem Relation Age of Onset   Melanoma Mother        metastatic   Cancer Mother        melanoma   Hypertension Mother    Arthritis Father    Colon polyps Father    COPD Father    Hyperlipidemia Maternal Grandmother    Heart disease Maternal Grandmother    Arthritis Paternal Grandmother    Clotting disorder Paternal Grandmother    Clotting disorder Paternal Grandfather    Liver cancer Paternal Grandfather    Colon cancer Neg Hx    Esophageal cancer Neg Hx    Rectal cancer Neg Hx    Stomach cancer Neg Hx     Social History   Socioeconomic History   Marital status: Married    Spouse name: Not on file   Number of children: 3   Years of education: Not on file   Highest education level: Not on file  Occupational History   Occupation: retired    Comment: Retired  Tobacco Use   Smoking status: Former    Packs/day: 0.25    Years: 28.00    Pack years: 7.00    Types: Cigarettes    Start date: 1972    Quit date: 2000  Years since quitting: 22.6   Smokeless tobacco: Never  Vaping Use   Vaping Use: Never used  Substance and Sexual Activity   Alcohol use: No    Alcohol/week: 0.0 standard drinks   Drug use: No   Sexual activity: Not on file  Other Topics Concern   Not on file  Social History Narrative   Not on file   Social Determinants of Health   Financial Resource Strain: Not on file  Food Insecurity: Not on file  Transportation Needs: Not on file  Physical Activity: Not on file  Stress: Not on file  Social Connections: Not on file  Intimate Partner Violence: Not on file    Outpatient Medications Prior to Visit  Medication Sig Dispense Refill   acetaminophen (TYLENOL) 500 MG tablet Take 500 mg by mouth every 6 (six) hours as needed for moderate pain.     ALPRAZolam (XANAX) 0.25 MG tablet Take 0.25 mg by mouth daily.     aspirin 81 MG tablet Take 81 mg by mouth daily.      atorvastatin (LIPITOR) 80 MG tablet TAKE 1 TABLET BY MOUTH EVERY DAY 90 tablet 1   Azelastine HCl 137 MCG/SPRAY SOLN PLACE 1 SPRAY INTO BOTH NOSTRILS 2 (TWO) TIMES DAILY AS DIRECTED 30 mL 1   carvedilol (COREG) 12.5 MG tablet TAKE 1 TABLET TWICE A DAY WITH A MEAL 180 tablet 0   celecoxib (CELEBREX) 200 MG capsule Take 200 mg by mouth daily.      clonazePAM (KLONOPIN) 0.5 MG tablet Take 0.5 mg by mouth at bedtime.      diclofenac Sodium (VOLTAREN) 1 % GEL diclofenac 1 % topical gel     escitalopram (LEXAPRO) 20 MG tablet Take 20 mg by mouth daily.     ezetimibe (ZETIA) 10 MG tablet TAKE 1 TABLET BY MOUTH EVERY DAY 90 tablet 1   gabapentin (NEURONTIN) 600 MG tablet TAKE 1 TABLET BY MOUTH TWICE A DAY 180 tablet 1   lisinopril (ZESTRIL) 10 MG tablet Take 1 tablet (10 mg total) by mouth daily. 90 tablet 3   LOTEMAX 0.5 % GEL SMARTSIG:1 Drop(s) In Eye(s) Twice Daily PRN     mometasone (NASONEX) 50 MCG/ACT nasal spray Place 2 sprays into the nose daily. 1 each 0   mupirocin cream (BACTROBAN) 2 % Apply 1 application topically 2 (two) times daily. 15 g 0   ondansetron (ZOFRAN ODT) 4 MG disintegrating tablet Take 1 tablet (4 mg total) by mouth every 8 (eight) hours as needed for nausea or vomiting. 20 tablet 0   pantoprazole (PROTONIX) 40 MG tablet TAKE 1 TABLET BY MOUTH TWICE A DAY 180 tablet 3   RESTASIS 0.05 % ophthalmic emulsion Place 1 drop into both eyes 2 (two) times daily.      sacubitril-valsartan (ENTRESTO) 24-26 MG Take 1 tablet by mouth 2 (two) times daily. 60 tablet 11   tacrolimus (PROTOPIC) 0.1 % ointment APPLY TO AFFECTED AREA ONCE A DAY     tamsulosin (FLOMAX) 0.4 MG CAPS capsule TAKE 1 CAPSULE (0.4 MG TOTAL) BY MOUTH DAILY AFTER SUPPER. 90 capsule 1   zolpidem (AMBIEN) 10 MG tablet Take 10 mg by mouth at bedtime as needed for sleep.     No facility-administered medications prior to visit.    Allergies  Allergen Reactions   Latex Other (See Comments)    Other   Tape     Use Paper  Tape    Sulfa Antibiotics Rash    ROS Review of Systems  Constitutional:  Negative for fatigue.  Respiratory:  Negative for cough and shortness of breath.   Cardiovascular:  Negative for chest pain and leg swelling.  Neurological:  Negative for dizziness and syncope.     Objective:    Physical Exam Vitals reviewed.  Constitutional:      Appearance: Normal appearance.  HENT:     Nose:     Comments: Nasal mucosa is clear.  He has a blood clear mucus in both nares.  No nasal polyps. Cardiovascular:     Rate and Rhythm: Normal rate and regular rhythm.     Comments: Does appear to have soft 1/6 systolic murmur right upper sternal border.  Otherwise heart sounds normal Pulmonary:     Effort: Pulmonary effort is normal.     Breath sounds: Normal breath sounds.  Musculoskeletal:     Cervical back: Neck supple.  Lymphadenopathy:     Cervical: No cervical adenopathy.  Neurological:     Mental Status: He is alert.    BP 120/60 (BP Location: Left Arm, Patient Position: Sitting, Cuff Size: Normal)   Pulse (!) 57   Temp 97.7 F (36.5 C) (Oral)   Wt 216 lb 12.8 oz (98.3 kg)   SpO2 95%   BMI 27.84 kg/m  Wt Readings from Last 3 Encounters:  11/13/20 216 lb 12.8 oz (98.3 kg)  11/12/20 218 lb (98.9 kg)  08/27/20 218 lb 8 oz (99.1 kg)     Health Maintenance Due  Topic Date Due   OPHTHALMOLOGY EXAM  07/28/2020   INFLUENZA VACCINE  10/15/2020    There are no preventive care reminders to display for this patient.  Lab Results  Component Value Date   TSH 1.62 03/30/2020   Lab Results  Component Value Date   WBC 4.1 03/30/2020   HGB 13.0 03/30/2020   HCT 38.4 (L) 03/30/2020   MCV 90.6 03/30/2020   PLT 173.0 03/30/2020   Lab Results  Component Value Date   NA 140 03/30/2020   K 4.8 03/30/2020   CO2 31 03/30/2020   GLUCOSE 122 (H) 03/30/2020   BUN 15 03/30/2020   CREATININE 1.14 03/30/2020   BILITOT 0.5 03/30/2020   ALKPHOS 56 03/30/2020   AST 20 03/30/2020   ALT  20 03/30/2020   PROT 6.8 03/30/2020   ALBUMIN 4.8 03/30/2020   CALCIUM 9.6 03/30/2020   ANIONGAP 7 04/03/2014   GFR 67.79 03/30/2020   Lab Results  Component Value Date   CHOL 146 03/30/2020   Lab Results  Component Value Date   HDL 49.10 03/30/2020   Lab Results  Component Value Date   LDLCALC 72 03/30/2020   Lab Results  Component Value Date   TRIG 128.0 03/30/2020   Lab Results  Component Value Date   CHOLHDL 3 03/30/2020   Lab Results  Component Value Date   HGBA1C 6.2 (A) 08/08/2020      Assessment & Plan:   Question of faint heart murmur right upper sternal border.  Past history of trivial aortic regurgitation on previous echocardiogram.  He does have a sending aortic aneurysm.  No worrisome symptoms such as dyspnea, dizziness, chest pains.  -Reassurance and close follow-up with cardiology as scheduled.  We do not see any indication for urgent cardiology referral at this time.    Follow-up: No follow-ups on file.    Carolann Littler, MD

## 2020-11-21 MED ORDER — LISINOPRIL 20 MG PO TABS: 20.0000 mg | ORAL_TABLET | Freq: Every day | ORAL | 3 refills | Status: DC

## 2020-11-23 ENCOUNTER — Other Ambulatory Visit: Payer: Self-pay | Admitting: Cardiology

## 2020-11-23 MED ORDER — LISINOPRIL 20 MG PO TABS: 20.0000 mg | ORAL_TABLET | Freq: Every day | ORAL | 3 refills | Status: DC

## 2020-12-10 ENCOUNTER — Other Ambulatory Visit: Payer: Self-pay

## 2020-12-10 ENCOUNTER — Encounter: Payer: Self-pay | Admitting: Infectious Disease

## 2020-12-10 ENCOUNTER — Ambulatory Visit (INDEPENDENT_AMBULATORY_CARE_PROVIDER_SITE_OTHER): Payer: Medicare Other | Admitting: Infectious Disease

## 2020-12-10 VITALS — BP 109/69 | HR 60 | Temp 97.8°F | Wt 226.0 lb

## 2020-12-10 DIAGNOSIS — Z882 Allergy status to sulfonamides status: Secondary | ICD-10-CM

## 2020-12-10 DIAGNOSIS — A498 Other bacterial infections of unspecified site: Secondary | ICD-10-CM | POA: Diagnosis not present

## 2020-12-10 DIAGNOSIS — L739 Follicular disorder, unspecified: Secondary | ICD-10-CM | POA: Diagnosis not present

## 2020-12-10 DIAGNOSIS — Z23 Encounter for immunization: Secondary | ICD-10-CM

## 2020-12-10 DIAGNOSIS — E1165 Type 2 diabetes mellitus with hyperglycemia: Secondary | ICD-10-CM

## 2020-12-10 DIAGNOSIS — Z7185 Encounter for immunization safety counseling: Secondary | ICD-10-CM

## 2020-12-10 HISTORY — DX: Other bacterial infections of unspecified site: A49.8

## 2020-12-10 HISTORY — DX: Allergy status to sulfonamides: Z88.2

## 2020-12-10 HISTORY — DX: Follicular disorder, unspecified: L73.9

## 2020-12-10 MED ORDER — CEFDINIR 300 MG PO CAPS: 300.0000 mg | ORAL_CAPSULE | Freq: Two times a day (BID) | ORAL | 4 refills | Status: AC

## 2020-12-10 NOTE — Progress Notes (Signed)
   Covid-19 Vaccination Clinic  Name:  Peter Snyder    MRN: [id number] DOB: June 25, 1955  12/10/2020  [name] was observed post Covid-19 immunization for 15 minutes without incident. He was provided with Vaccine Information Sheet and instruction to access the V-Safe system.   [name] was instructed to call 911 with any severe reactions post vaccine: Difficulty breathing  Swelling of face and throat  A fast heartbeat  A bad rash all over body  Dizziness and weakness     Leatrice Jewels, RMA

## 2020-12-10 NOTE — Progress Notes (Signed)
Subjective:   Reason for Infectious Disease Consult: Recurrent folliculitis of scalp  Requesting Physician: Wilhemina Bonito, MD   Patient ID: Peter Snyder, male    DOB: 02-23-1956, 65 y.o.   MRN: [id number]  [place] is a 65 year old Caucasian man with hx of DM, CAD< GERD, HTN, Hyperlipidemia, sulfa allergy, is being followed by Dr. Hal Neer with dermatology when in November 2021 he developed a folliculitis of his scalp which was successfully treated with oral Keflex and topical clindamycin.  He had done well until July 2022 when he again experienced a flare of folliculitis.  Culture was taken and yielded an E. coli that was resistant to first generation cephalosporins Keflex amoxicillin amoxicillin clavulanic acid but sensitive to ceftriaxone and ceftazidime cefepime trimethoprim sulfa.  Dr. Ronnald Ramp called me about advice for an oral antibiotic I recommended trying cefdinir which he took along with topical gentamicin with resolution of the folliculitis.  Since then the patient had recurrence again in September again with response to oral cefdinir and topical gentamicin.  In talking to Dr. Ronnald Ramp they discovered that he has been taking a topical shampoo for years on and which is made by compounding company in which he oftentimes buys ahead of time in stores under his dresser.  Dr. Ronnald Ramp raised concern that potentially this shampoo could be contaminated with E. coli and could be one of the reasons he keeps having recurrent folliculitis.  He has stopped using that shampoo altogether.      Past Medical History:  Diagnosis Date   Allergy    Allergy to sulfa drugs 12/10/2020   Arthritis    CAD (coronary artery disease) 6/14   Cardiac catheterization 6/27/ 2014 ejection fraction 35-40%, 30% proximal left circumflex, 100% tiny obtuse marginal 1 with collaterals, 50% LAD, 50% D1, 100% RCA with collaterals   Chicken pox    Chronic back pain    Colon polyps    Depression    Diabetes mellitus  ([place])    Diet controlled- OFF metformin 02-01-20   E coli infection 7/67/3419   Folliculitis 3/79/0240   GERD (gastroesophageal reflux disease)    Hyperlipidemia    Hypertension    Kidney stone    Myocardial infarction [facility])    ? year- cath 2014/ june     Past Surgical History:  Procedure Laterality Date   COLONOSCOPY     CRYO INTERCOSTAL NERVE BLOCK     double sacrum nerve block   epidural injections     multiple- L5 S1 and cervical neck pain issues as well    POLYPECTOMY     TONSILLECTOMY  1963   TRIGGER FINGER RELEASE  2019    Family History  Problem Relation Age of Onset   Melanoma Mother        metastatic   Cancer Mother        melanoma   Hypertension Mother    Arthritis Father    Colon polyps Father    COPD Father    Hyperlipidemia Maternal Grandmother    Heart disease Maternal Grandmother    Arthritis Paternal Grandmother    Clotting disorder Paternal Grandmother    Clotting disorder Paternal Grandfather    Liver cancer Paternal Grandfather    Colon cancer Neg Hx    Esophageal cancer Neg Hx    Rectal cancer Neg Hx    Stomach cancer Neg Hx       Social History   Socioeconomic History   Marital status: Married  Spouse name: Not on file   Number of children: 3   Years of education: Not on file   Highest education level: Not on file  Occupational History   Occupation: retired    Comment: Retired  Tobacco Use   Smoking status: Former    Packs/day: 0.25    Years: 28.00    Pack years: 7.00    Types: Cigarettes    Start date: 1972    Quit date: 2000    Years since quitting: 22.7   Smokeless tobacco: Never  Vaping Use   Vaping Use: Never used  Substance and Sexual Activity   Alcohol use: No    Alcohol/week: 0.0 standard drinks   Drug use: No   Sexual activity: Not on file  Other Topics Concern   Not on file  Social History Narrative   Not on file   Social Determinants of Health   Financial Resource Strain: Not on file  Food  Insecurity: Not on file  Transportation Needs: Not on file  Physical Activity: Not on file  Stress: Not on file  Social Connections: Not on file    Allergies  Allergen Reactions   Latex Other (See Comments)    Other   Tape     Use Paper Tape    Sulfa Antibiotics Rash     Current Outpatient Medications:    acetaminophen (TYLENOL) 500 MG tablet, Take 500 mg by mouth every 6 (six) hours as needed for moderate pain., Disp: , Rfl:    ALPRAZolam (XANAX) 0.25 MG tablet, Take 0.25 mg by mouth daily., Disp: , Rfl:    aspirin 81 MG tablet, Take 81 mg by mouth daily., Disp: , Rfl:    atorvastatin (LIPITOR) 80 MG tablet, TAKE 1 TABLET BY MOUTH EVERY DAY, Disp: 90 tablet, Rfl: 1   Azelastine HCl 137 MCG/SPRAY SOLN, PLACE 1 SPRAY INTO BOTH NOSTRILS 2 (TWO) TIMES DAILY AS DIRECTED, Disp: 30 mL, Rfl: 1   carvedilol (COREG) 12.5 MG tablet, TAKE 1 TABLET TWICE A DAY WITH A MEAL, Disp: 180 tablet, Rfl: 0   cefdinir (OMNICEF) 300 MG capsule, Take 1 capsule (300 mg total) by mouth 2 (two) times daily for 14 days., Disp: 28 capsule, Rfl: 4   celecoxib (CELEBREX) 200 MG capsule, Take 200 mg by mouth daily. , Disp: , Rfl:    clonazePAM (KLONOPIN) 0.5 MG tablet, Take 0.5 mg by mouth at bedtime. , Disp: , Rfl:    diclofenac Sodium (VOLTAREN) 1 % GEL, diclofenac 1 % topical gel, Disp: , Rfl:    escitalopram (LEXAPRO) 20 MG tablet, Take 20 mg by mouth daily., Disp: , Rfl:    gabapentin (NEURONTIN) 600 MG tablet, TAKE 1 TABLET BY MOUTH TWICE A DAY, Disp: 180 tablet, Rfl: 1   lisinopril (ZESTRIL) 20 MG tablet, Take 1 tablet (20 mg total) by mouth daily., Disp: 90 tablet, Rfl: 3   LOTEMAX 0.5 % GEL, SMARTSIG:1 Drop(s) In Eye(s) Twice Daily PRN, Disp: , Rfl:    pantoprazole (PROTONIX) 40 MG tablet, TAKE 1 TABLET BY MOUTH TWICE A DAY, Disp: 180 tablet, Rfl: 3   RESTASIS 0.05 % ophthalmic emulsion, Place 1 drop into both eyes 2 (two) times daily. , Disp: , Rfl:    tacrolimus (PROTOPIC) 0.1 % ointment, APPLY TO  AFFECTED AREA ONCE A DAY, Disp: , Rfl:    tamsulosin (FLOMAX) 0.4 MG CAPS capsule, TAKE 1 CAPSULE (0.4 MG TOTAL) BY MOUTH DAILY AFTER SUPPER., Disp: 90 capsule, Rfl: 1   zolpidem (AMBIEN)  10 MG tablet, Take 10 mg by mouth at bedtime as needed for sleep., Disp: , Rfl:    ezetimibe (ZETIA) 10 MG tablet, TAKE 1 TABLET BY MOUTH EVERY DAY, Disp: 90 tablet, Rfl: 1   mometasone (NASONEX) 50 MCG/ACT nasal spray, Place 2 sprays into the nose daily. (Patient not taking: Reported on 12/10/2020), Disp: 1 each, Rfl: 0   mupirocin cream (BACTROBAN) 2 %, Apply 1 application topically 2 (two) times daily. (Patient not taking: Reported on 12/10/2020), Disp: 15 g, Rfl: 0   ondansetron (ZOFRAN ODT) 4 MG disintegrating tablet, Take 1 tablet (4 mg total) by mouth every 8 (eight) hours as needed for nausea or vomiting. (Patient not taking: Reported on 12/10/2020), Disp: 20 tablet, Rfl: 0    Review of Systems  Constitutional:  Negative for activity change, appetite change, chills, diaphoresis, fatigue, fever and unexpected weight change.  HENT:  Negative for congestion, rhinorrhea, sinus pressure, sneezing, sore throat and trouble swallowing.   Eyes:  Negative for photophobia and visual disturbance.  Respiratory:  Negative for cough, chest tightness, shortness of breath, wheezing and stridor.   Cardiovascular:  Negative for chest pain, palpitations and leg swelling.  Gastrointestinal:  Negative for abdominal distention, abdominal pain, anal bleeding, blood in stool, constipation, diarrhea, nausea and vomiting.  Genitourinary:  Negative for difficulty urinating, dysuria, flank pain and hematuria.  Musculoskeletal:  Negative for arthralgias, back pain, gait problem, joint swelling and myalgias.  Skin:  Negative for color change, pallor, rash and wound.  Neurological:  Negative for dizziness, tremors, weakness and light-headedness.  Hematological:  Negative for adenopathy. Does not bruise/bleed easily.   Psychiatric/Behavioral:  Negative for agitation, behavioral problems, confusion, decreased concentration, dysphoric mood and sleep disturbance.       Objective:   Physical Exam Constitutional:      Appearance: He is well-developed.  HENT:     Head: Normocephalic and atraumatic.  Eyes:     Conjunctiva/sclera: Conjunctivae normal.  Cardiovascular:     Rate and Rhythm: Normal rate and regular rhythm.  Pulmonary:     Effort: Pulmonary effort is normal. No respiratory distress.     Breath sounds: No wheezing.  Abdominal:     General: There is no distension.     Palpations: Abdomen is soft.  Musculoskeletal:        General: No tenderness. Normal range of motion.     Cervical back: Normal range of motion and neck supple.  Skin:    General: Skin is warm and dry.     Coloration: Skin is not pale.     Findings: No erythema or rash.  Neurological:     General: No focal deficit present.     Mental Status: He is alert and oriented to person, place, and time.  Psychiatric:        Mood and Affect: Mood normal.        Behavior: Behavior normal.        Thought Content: Thought content normal.        Judgment: Judgment normal.    No evidence of folliculitis on exam today.      Assessment & Plan:  Recurrent folliculitis:  Typically I do not expect E. coli to be a culprit for folliculitis of the scalp but it was isolated on several cultures for what its worth.  The idea of the shampoo being a potential source is reasonable.  It is odd that it has not happened between November and July though it could potentially be related  to the particular batch of shampoo he is using.  Now we will observe him I have sent in a prescription for cefdinir so that he has it on hand to begin at the first sign of folliculitis.   Sulfa allergy: he had hives in 2011 to this which is pity since it was active vs E coli  Certainly if he needs to be seen promptly and I do not have an appointment he could see one  of the other 9 providers in the clinic.  Vaccine counseling: Gave him counseling and he agreed to have his updated bivalent COVID 19 vaccine  DM: following Dr. Elease Hashimoto  I spent 61 minutes with the patient including than 50% of the time in face to face counseling of the patient re nature of recurrent folliculitis, reviewing his culture data and notes from Dr. Ronnald Ramp office along with review of medical records in preparation for the visit and during the visit and in coordination of his care.

## 2020-12-23 ENCOUNTER — Other Ambulatory Visit: Payer: Self-pay | Admitting: Family Medicine

## 2021-01-21 ENCOUNTER — Telehealth: Payer: Self-pay

## 2021-01-21 NOTE — Telephone Encounter (Signed)
Caller states he has PVC when he gets stressed out. He had a couple of attacks yesterday. PT isn't having sx currently. PT has questions about possible supplements. [place] Not Listed per the office he has a left a message with this heart DR He has PVC when he gets stressed out. He had a couple of attacks yesterday. He has questions about possible supplements. He had a good who passed away and his niece has cancer so he has been under a lot ofstress.  01/21/2021 9:31:26 AM See HCP within 4 Hours (or PCP triage)Phillips, RN, [place] Understands Yes  Care Advice Given Per Guideline SEE HCP (OR PCP TRIAGE) WITHIN 4 HOURS: CALL BACK IF: * You become worse CARE ADVICE given per Heart Rate and Heartbeat Questions (Adult) guideline.  01/21/21 1205: Pt has sent message to cardiologist at [phone] & is awaiting response.

## 2021-01-23 ENCOUNTER — Other Ambulatory Visit: Payer: Self-pay | Admitting: Family Medicine

## 2021-01-24 ENCOUNTER — Other Ambulatory Visit: Payer: Self-pay | Admitting: Family Medicine

## 2021-01-27 ENCOUNTER — Ambulatory Visit (HOSPITAL_COMMUNITY)
Admission: EM | Admit: 2021-01-27 | Discharge: 2021-01-27 | Disposition: A | Discharge status: Home / Self Care | Payer: Medicare Other | Attending: Medical Oncology | Admitting: Medical Oncology

## 2021-01-27 ENCOUNTER — Other Ambulatory Visit: Payer: Self-pay

## 2021-01-27 ENCOUNTER — Encounter (HOSPITAL_COMMUNITY): Payer: Self-pay | Admitting: Emergency Medicine

## 2021-01-27 DIAGNOSIS — J069 Acute upper respiratory infection, unspecified: Secondary | ICD-10-CM | POA: Diagnosis not present

## 2021-01-27 MED ORDER — BENZONATATE 200 MG PO CAPS: 200.0000 mg | ORAL_CAPSULE | Freq: Three times a day (TID) | ORAL | 0 refills | Status: DC | PRN

## 2021-01-27 MED ORDER — METHYLPREDNISOLONE SODIUM SUCC 125 MG IJ SOLR
INTRAMUSCULAR | Status: AC
  Filled 2021-01-27: qty 2

## 2021-01-27 MED ORDER — METHYLPREDNISOLONE SODIUM SUCC 125 MG IJ SOLR
125.0000 mg | Freq: Once | INTRAMUSCULAR | Status: AC
  Administered 2021-01-27: 125 mg via INTRAMUSCULAR

## 2021-01-27 MED ORDER — ALBUTEROL SULFATE HFA 108 (90 BASE) MCG/ACT IN AERS: 1.0000 | INHALATION_SPRAY | Freq: Four times a day (QID) | RESPIRATORY_TRACT | 0 refills | Status: DC | PRN

## 2021-01-27 NOTE — ED Triage Notes (Signed)
Pt has congestion and cough for couple days. Reports coughed all night last night. Tried taking cough pearls that had left over.

## 2021-01-27 NOTE — ED Provider Notes (Signed)
[facility]    CSN: [id number] Arrival date & time: 01/27/21  1004      History   Chief Complaint Chief Complaint  Patient presents with   Cough    HPI Peter Snyder is a 65 y.o. male.   HPI  Cough: Pt reports that she has had a dry cough and nasal congestion for the past few days. No known sick contacts. He reports that he was coughing all of last night despite taking the tessalon, mucinex he had left over from a previous cold-feels like he may have bronchitis. He denies SOB, wheezing, fevers, hemoptysis. He does have a history of elevated blood sugars but he reports that this was secondary to steroid injection many years ago- recent glucose values have been normal with recent A1C of 6.3%.   Past Medical History:  Diagnosis Date   Allergy    Allergy to sulfa drugs 12/10/2020   Arthritis    CAD (coronary artery disease) 6/14   Cardiac catheterization 6/27/ 2014 ejection fraction 35-40%, 30% proximal left circumflex, 100% tiny obtuse marginal 1 with collaterals, 50% LAD, 50% D1, 100% RCA with collaterals   Chicken pox    Chronic back pain    Colon polyps    Depression    Diabetes mellitus ([place])    Diet controlled- OFF metformin 02-01-20   E coli infection 2/42/6834   Folliculitis 1/96/2229   GERD (gastroesophageal reflux disease)    Hyperlipidemia    Hypertension    Kidney stone    Myocardial infarction [facility])    ? year- cath 2014/ june     Patient Active Problem List   Diagnosis Date Noted   Folliculitis 79/89/2119   Allergy to sulfa drugs 12/10/2020   E coli infection 12/10/2020   Allergic rhinitis 08/03/2020   Type 2 diabetes mellitus with hyperglycemia ([place]) 07/11/2019   Pes anserinus bursitis of left knee 05/20/2018   Pain in left knee 02/26/2018   DDD (degenerative disc disease), cervical 06/02/2017   Degeneration of lumbar intervertebral disc 05/25/2017   Acquired trigger finger of left middle finger 05/08/2017   Pain in finger of left hand  05/08/2017   Trigger finger 03/28/2017   Viral URI 03/16/2015   Gastroenteritis 04/03/2014   Chronic insomnia 03/14/2014   Peripheral neuropathy 09/02/2013   Cardiomyopathy, ischemic 01/18/2013   CAD (coronary artery disease) 12/29/2012   GERD (gastroesophageal reflux disease) 12/29/2012   Essential hypertension, benign 12/29/2012   Dyslipidemia 12/29/2012   Personal history of colonic polyps 12/29/2012   Kidney stones 12/29/2012   Depression 12/29/2012   Degenerative arthritis of lumbar spine 12/29/2012   BPH (benign prostatic hyperplasia) 12/29/2012   Essential tremor 12/29/2012   Prediabetes 12/29/2012    Past Surgical History:  Procedure Laterality Date   COLONOSCOPY     CRYO INTERCOSTAL NERVE BLOCK     double sacrum nerve block   epidural injections     multiple- L5 S1 and cervical neck pain issues as well    POLYPECTOMY     [place]  2019       Home Medications    Prior to Admission medications   Medication Sig Start Date End Date Taking? Authorizing Provider  albuterol (VENTOLIN HFA) 108 (90 Base) MCG/ACT inhaler Inhale 1-2 puffs into the lungs every 6 (six) hours as needed for wheezing or shortness of breath. 01/27/21  Yes Janal Haak, Judson Roch M, PA-C  benzonatate (TESSALON) 200 MG capsule Take 1 capsule (200 mg total)  by mouth 3 (three) times daily as needed for cough. 01/27/21  Yes Olyvia Gopal M, PA-C  acetaminophen (TYLENOL) 500 MG tablet Take 500 mg by mouth every 6 (six) hours as needed for moderate pain.    [provider]  ALPRAZolam Duanne Moron) 0.25 MG tablet Take 0.25 mg by mouth daily.    [provider]  aspirin 81 MG tablet Take 81 mg by mouth daily.    [provider]  atorvastatin (LIPITOR) 80 MG tablet TAKE 1 TABLET BY MOUTH EVERY DAY 01/24/21   Burchette, Alinda Sierras, MD  Azelastine HCl 137 MCG/SPRAY SOLN PLACE 1 SPRAY INTO BOTH NOSTRILS 2 (TWO) TIMES DAILY AS DIRECTED 12/24/20   Burchette,  Alinda Sierras, MD  carvedilol (COREG) 12.5 MG tablet TAKE 1 TABLET TWICE A DAY WITH A MEAL 11/12/20   Burchette, Alinda Sierras, MD  celecoxib (CELEBREX) 200 MG capsule Take 200 mg by mouth daily.     [provider]  clonazePAM (KLONOPIN) 0.5 MG tablet Take 0.5 mg by mouth at bedtime.     [provider]  diclofenac Sodium (VOLTAREN) 1 % GEL diclofenac 1 % topical gel    [provider]  escitalopram (LEXAPRO) 20 MG tablet Take 20 mg by mouth daily.    [provider]  ezetimibe (ZETIA) 10 MG tablet TAKE 1 TABLET BY MOUTH EVERY DAY 11/05/20   Burchette, Alinda Sierras, MD  gabapentin (NEURONTIN) 600 MG tablet TAKE 1 TABLET BY MOUTH TWICE A DAY 09/11/20   Burchette, Alinda Sierras, MD  lisinopril (ZESTRIL) 20 MG tablet Take 1 tablet (20 mg total) by mouth daily. 11/23/20 02/21/21  Lelon Perla, MD  LOTEMAX 0.5 % GEL SMARTSIG:1 Drop(s) In Eye(s) Twice Daily PRN 12/06/18   [provider]  mometasone (NASONEX) 50 MCG/ACT nasal spray Place 2 sprays into the nose daily. Patient not taking: Reported on 12/10/2020 11/12/20   [place], Betty G, MD  mupirocin cream (BACTROBAN) 2 % Apply 1 application topically 2 (two) times daily. Patient not taking: Reported on 12/10/2020 08/23/20   Maximiano Coss, NP  ondansetron (ZOFRAN ODT) 4 MG disintegrating tablet Take 1 tablet (4 mg total) by mouth every 8 (eight) hours as needed for nausea or vomiting. Patient not taking: Reported on 12/10/2020 08/25/20   Hughie Closs, PA-C  pantoprazole (PROTONIX) 40 MG tablet TAKE 1 TABLET BY MOUTH TWICE A DAY 04/16/20   Burchette, Alinda Sierras, MD  RESTASIS 0.05 % ophthalmic emulsion Place 1 drop into both eyes 2 (two) times daily.  11/20/12   [provider]  tacrolimus (PROTOPIC) 0.1 % ointment APPLY TO AFFECTED AREA ONCE A DAY 01/13/19   [provider]  tamsulosin (FLOMAX) 0.4 MG CAPS capsule TAKE 1 CAPSULE (0.4 MG TOTAL) BY MOUTH DAILY AFTER SUPPER. 08/17/20   Burchette, Alinda Sierras, MD  zolpidem  (AMBIEN) 10 MG tablet Take 10 mg by mouth at bedtime as needed for sleep.    [provider]    Family History Family History  Problem Relation Age of Onset   Melanoma Mother        metastatic   Cancer Mother        melanoma   Hypertension Mother    Arthritis Father    Colon polyps Father    COPD Father    Hyperlipidemia Maternal Grandmother    Heart disease Maternal Grandmother    Arthritis Paternal Grandmother    Clotting disorder Paternal Grandmother    Clotting disorder Paternal Grandfather    Liver  cancer Paternal Grandfather    Colon cancer Neg Hx    Esophageal cancer Neg Hx    Rectal cancer Neg Hx    Stomach cancer Neg Hx     Social History Social History   Tobacco Use   Smoking status: Former    Packs/day: 0.25    Years: 28.00    Pack years: 7.00    Types: Cigarettes    Start date: 52    Quit date: 2000    Years since quitting: 22.8   Smokeless tobacco: Never  Vaping Use   Vaping Use: Never used  Substance Use Topics   Alcohol use: No    Alcohol/week: 0.0 standard drinks   Drug use: No     Allergies   Latex, Tape, and Sulfa antibiotics   Review of Systems Review of Systems  As stated above in HPI Physical Exam Triage Vital Signs ED Triage Vitals  Enc Vitals Group     BP 01/27/21 1025 118/74     Pulse Rate 01/27/21 1025 63     Resp 01/27/21 1025 18     Temp 01/27/21 1025 97.8 F (36.6 C)     Temp Source 01/27/21 1025 Oral     SpO2 01/27/21 1025 99 %     Weight --      Height --      Head Circumference --      Peak Flow --      Pain Score 01/27/21 1024 0     Pain Loc --      Pain Edu? --      Excl. in [place]? --    No data found.  Updated Vital Signs BP 118/74 (BP Location: Right Arm)   Pulse 63   Temp 97.8 F (36.6 C) (Oral)   Resp 18   SpO2 99%   Physical Exam Vitals and nursing note reviewed.  Constitutional:      General: He is not in acute distress.    Appearance: Normal appearance. He is not ill-appearing,  toxic-appearing or diaphoretic.     Comments: Mild laryngitis  HENT:     Head: Normocephalic and atraumatic.     Right Ear: Tympanic membrane normal.     Left Ear: Tympanic membrane normal.     Nose: Congestion and rhinorrhea present.     Mouth/Throat:     Mouth: Mucous membranes are moist.     Pharynx: Oropharynx is clear. No oropharyngeal exudate or posterior oropharyngeal erythema.  Eyes:     Extraocular Movements: Extraocular movements intact.     Pupils: Pupils are equal, round, and reactive to light.  Cardiovascular:     Rate and Rhythm: Normal rate and regular rhythm.     Heart sounds: Normal heart sounds.  Pulmonary:     Effort: Pulmonary effort is normal.     Breath sounds: Normal breath sounds.  Musculoskeletal:     Cervical back: Normal range of motion and neck supple.  Lymphadenopathy:     Cervical: No cervical adenopathy.  Skin:    General: Skin is warm.  Neurological:     Mental Status: He is alert and oriented to person, place, and time.     UC Treatments / Results  Labs (all labs ordered are listed, but only abnormal results are displayed) Labs Reviewed - No data to display  EKG   Radiology No results found.  Procedures Procedures (including critical care time)  Medications Ordered in UC Medications  methylPREDNISolone sodium succinate (SOLU-MEDROL) 125 mg/2 mL  injection 125 mg (has no administration in time range)    Initial Impression / Assessment and Plan / UC Course  I have reviewed the triage vital signs and the nursing notes.  Pertinent labs & imaging results that were available during my care of the patient were reviewed by me and considered in my medical decision making (see chart for details).     New. Appears viral in nature with likely progression to mild bronchitis. We discussed treatment options along with risks and benefits. He elects solu-medrol injection which he reports that he has done well with in the past along with tessalon  200 mg and albuterol. He will continue his normal flonase. Follow up PRN.  Final Clinical Impressions(s) / UC Diagnoses   Final diagnoses:  URI with cough and congestion   Discharge Instructions   None    ED Prescriptions     Medication Sig Dispense Auth. Provider   benzonatate (TESSALON) 200 MG capsule Take 1 capsule (200 mg total) by mouth 3 (three) times daily as needed for cough. 20 capsule Noriah Osgood M, PA-C   albuterol (VENTOLIN HFA) 108 (90 Base) MCG/ACT inhaler Inhale 1-2 puffs into the lungs every 6 (six) hours as needed for wheezing or shortness of breath. 1 each Hughie Closs, PA-C      PDMP not reviewed this encounter.   Hughie Closs, [place] 01/27/21 1047

## 2021-01-28 ENCOUNTER — Encounter: Payer: Self-pay | Admitting: Family Medicine

## 2021-01-29 ENCOUNTER — Telehealth (INDEPENDENT_AMBULATORY_CARE_PROVIDER_SITE_OTHER): Payer: Medicare Other | Admitting: Adult Health

## 2021-01-29 DIAGNOSIS — I255 Ischemic cardiomyopathy: Secondary | ICD-10-CM | POA: Diagnosis not present

## 2021-01-29 DIAGNOSIS — J069 Acute upper respiratory infection, unspecified: Secondary | ICD-10-CM

## 2021-01-29 MED ORDER — BENZONATATE 200 MG PO CAPS: 200.0000 mg | ORAL_CAPSULE | Freq: Three times a day (TID) | ORAL | 2 refills | Status: DC | PRN

## 2021-01-29 MED ORDER — SPACER/AERO-HOLDING CHAMBERS DEVI: 0 refills | Status: DC

## 2021-01-29 MED ORDER — HYDROCODONE BIT-HOMATROP MBR 5-1.5 MG/5ML PO SOLN: 5.0000 mL | Freq: Three times a day (TID) | ORAL | 0 refills | Status: DC | PRN

## 2021-01-29 NOTE — Telephone Encounter (Signed)
Noted  

## 2021-01-29 NOTE — Progress Notes (Signed)
Virtual Visit via Video Note  I connected with [facility] on 01/29/21 at  5:00 PM EST by a video enabled telemedicine application and verified that I am speaking with the correct person using two identifiers.  Location patient: home Location provider:work or home office Persons participating in the virtual visit: patient, provider  I discussed the limitations of evaluation and management by telemedicine and the availability of in person appointments. The patient expressed understanding and agreed to proceed.   HPI: 65 year old male who is being evaluated today for an acute issue.  He was seen 2 days ago at urgent care for the complaint of dry cough and nasal congestion for a few days prior to being seen.  He denied shortness of breath, wheezing, fevers.  Urgent care symptoms were thought to be viral.  He was prescribed Tessalon Perles, albuterol inhaler, and given a Depo-Medrol injection.  Today is reports that his symptoms are not improving in the medications that he was given do not seem to be helping.  He does not feel as though he is getting enough of the inhaler into his lungs.  Cough is keeping him up at night.  Continues to deny fevers, chills, shortness of breath, or wheezing   ROS: See pertinent positives and negatives per HPI.  Past Medical History:  Diagnosis Date   Allergy    Allergy to sulfa drugs 12/10/2020   Arthritis    CAD (coronary artery disease) 6/14   Cardiac catheterization 6/27/ 2014 ejection fraction 35-40%, 30% proximal left circumflex, 100% tiny obtuse marginal 1 with collaterals, 50% LAD, 50% D1, 100% RCA with collaterals   Chicken pox    Chronic back pain    Colon polyps    Depression    Diabetes mellitus ([place])    Diet controlled- OFF metformin 02-01-20   E coli infection 05/30/4006   Folliculitis [birth year]/76/1950   GERD (gastroesophageal reflux disease)    Hyperlipidemia    Hypertension    Kidney stone    Myocardial infarction [facility])    ? year- cath 2014/  june     Past Surgical History:  Procedure Laterality Date   COLONOSCOPY     CRYO INTERCOSTAL NERVE BLOCK     double sacrum nerve block   epidural injections     multiple- L5 S1 and cervical neck pain issues as well    POLYPECTOMY     TONSILLECTOMY  1963   TRIGGER FINGER RELEASE  2019    Family History  Problem Relation Age of Onset   Melanoma Mother        metastatic   Cancer Mother        melanoma   Hypertension Mother    Arthritis Father    Colon polyps Father    COPD Father    Hyperlipidemia Maternal Grandmother    Heart disease Maternal Grandmother    Arthritis Paternal Grandmother    Clotting disorder Paternal Grandmother    Clotting disorder Paternal Grandfather    Liver cancer Paternal Grandfather    Colon cancer Neg Hx    Esophageal cancer Neg Hx    Rectal cancer Neg Hx    Stomach cancer Neg Hx        Current Outpatient Medications:    HYDROcodone bit-homatropine (HYCODAN) 5-1.5 MG/5ML syrup, Take 5 mLs by mouth every 8 (eight) hours as needed for cough., Disp: 120 mL, Rfl: 0   Spacer/Aero-Holding Chambers DEVI, 1 spacer, Disp: 1 each, Rfl: 0   acetaminophen (TYLENOL) 500 MG tablet, Take  500 mg by mouth every 6 (six) hours as needed for moderate pain., Disp: , Rfl:    albuterol (VENTOLIN HFA) 108 (90 Base) MCG/ACT inhaler, Inhale 1-2 puffs into the lungs every 6 (six) hours as needed for wheezing or shortness of breath., Disp: 1 each, Rfl: 0   ALPRAZolam (XANAX) 0.25 MG tablet, Take 0.25 mg by mouth daily., Disp: , Rfl:    aspirin 81 MG tablet, Take 81 mg by mouth daily., Disp: , Rfl:    atorvastatin (LIPITOR) 80 MG tablet, TAKE 1 TABLET BY MOUTH EVERY DAY, Disp: 90 tablet, Rfl: 1   Azelastine HCl 137 MCG/SPRAY SOLN, PLACE 1 SPRAY INTO BOTH NOSTRILS 2 (TWO) TIMES DAILY AS DIRECTED, Disp: 30 mL, Rfl: 1   benzonatate (TESSALON) 200 MG capsule, Take 1 capsule (200 mg total) by mouth 3 (three) times daily as needed for cough., Disp: 20 capsule, Rfl: 2    carvedilol (COREG) 12.5 MG tablet, TAKE 1 TABLET TWICE A DAY WITH A MEAL, Disp: 180 tablet, Rfl: 0   celecoxib (CELEBREX) 200 MG capsule, Take 200 mg by mouth daily. , Disp: , Rfl:    clonazePAM (KLONOPIN) 0.5 MG tablet, Take 0.5 mg by mouth at bedtime. , Disp: , Rfl:    diclofenac Sodium (VOLTAREN) 1 % GEL, diclofenac 1 % topical gel, Disp: , Rfl:    escitalopram (LEXAPRO) 20 MG tablet, Take 20 mg by mouth daily., Disp: , Rfl:    ezetimibe (ZETIA) 10 MG tablet, TAKE 1 TABLET BY MOUTH EVERY DAY, Disp: 90 tablet, Rfl: 1   gabapentin (NEURONTIN) 600 MG tablet, TAKE 1 TABLET BY MOUTH TWICE A DAY, Disp: 180 tablet, Rfl: 1   lisinopril (ZESTRIL) 20 MG tablet, Take 1 tablet (20 mg total) by mouth daily., Disp: 90 tablet, Rfl: 3   LOTEMAX 0.5 % GEL, SMARTSIG:1 Drop(s) In Eye(s) Twice Daily PRN, Disp: , Rfl:    mometasone (NASONEX) 50 MCG/ACT nasal spray, Place 2 sprays into the nose daily. (Patient not taking: Reported on 12/10/2020), Disp: 1 each, Rfl: 0   mupirocin cream (BACTROBAN) 2 %, Apply 1 application topically 2 (two) times daily. (Patient not taking: Reported on 12/10/2020), Disp: 15 g, Rfl: 0   pantoprazole (PROTONIX) 40 MG tablet, TAKE 1 TABLET BY MOUTH TWICE A DAY, Disp: 180 tablet, Rfl: 3   RESTASIS 0.05 % ophthalmic emulsion, Place 1 drop into both eyes 2 (two) times daily. , Disp: , Rfl:    tacrolimus (PROTOPIC) 0.1 % ointment, APPLY TO AFFECTED AREA ONCE A DAY, Disp: , Rfl:    tamsulosin (FLOMAX) 0.4 MG CAPS capsule, TAKE 1 CAPSULE (0.4 MG TOTAL) BY MOUTH DAILY AFTER SUPPER., Disp: 90 capsule, Rfl: 1   zolpidem (AMBIEN) 10 MG tablet, Take 10 mg by mouth at bedtime as needed for sleep., Disp: , Rfl:   EXAM:  VITALS per patient if applicable:  GENERAL: alert, oriented, appears well and in no acute distress  HEENT: atraumatic, conjunttiva clear, no obvious abnormalities on inspection of external nose and ears  NECK: normal movements of the head and neck  LUNGS: on inspection no signs  of respiratory distress, breathing rate appears normal, no obvious gross SOB, gasping or wheezing  CV: no obvious cyanosis  MS: moves all visible extremities without noticeable abnormality  PSYCH/NEURO: pleasant and cooperative, no obvious depression or anxiety, speech and thought processing grossly intact  ASSESSMENT AND PLAN:  Discussed the following assessment and plan:  Viral URI with cough - Plan: Spacer/Aero-Holding Chambers DEVI, HYDROcodone bit-homatropine (HYCODAN)  5-1.5 MG/5ML syrup, benzonatate (TESSALON) 200 MG capsule - Follow up as needed     I discussed the assessment and treatment plan with the patient. The patient was provided an opportunity to ask questions and all were answered. The patient agreed with the plan and demonstrated an understanding of the instructions.   The patient was advised to call back or seek an in-person evaluation if the symptoms worsen or if the condition fails to improve as anticipated.   Dorothyann Peng, NP

## 2021-01-31 ENCOUNTER — Ambulatory Visit: Payer: Medicare Other | Admitting: Infectious Disease

## 2021-02-01 ENCOUNTER — Encounter: Payer: Self-pay | Admitting: Adult Health

## 2021-02-01 MED ORDER — ALBUTEROL SULFATE HFA 108 (90 BASE) MCG/ACT IN AERS
1.0000 | INHALATION_SPRAY | Freq: Four times a day (QID) | RESPIRATORY_TRACT | 0 refills | Status: DC | PRN
Start: 2021-02-01 — End: 2021-03-04

## 2021-02-01 NOTE — Telephone Encounter (Signed)
Please advise 

## 2021-02-09 ENCOUNTER — Other Ambulatory Visit: Payer: Self-pay | Admitting: Family Medicine

## 2021-02-13 ENCOUNTER — Telehealth: Payer: Self-pay

## 2021-02-13 NOTE — Telephone Encounter (Signed)
Patient calling in with respiratory symptoms: Shortness of breath, chest pain, palpitations or other red words send to Triage  Does the patient have a fever over 100, cough, congestion, sore throat, runny nose, lost of taste/smell within the last 5 days (please list symptoms that patient has)?yes cough  Have you tested for Covid in the last 5 days? Yes   If yes, was it positive []  OR negative [x] ?

## 2021-02-14 NOTE — Telephone Encounter (Signed)
FYI  patient has an office visit scheduled for 02/15/21.

## 2021-02-15 ENCOUNTER — Encounter: Payer: Self-pay | Admitting: Family Medicine

## 2021-02-15 ENCOUNTER — Ambulatory Visit (INDEPENDENT_AMBULATORY_CARE_PROVIDER_SITE_OTHER): Payer: Medicare Other | Admitting: Family Medicine

## 2021-02-15 VITALS — BP 128/70 | HR 57 | Temp 97.7°F | Ht 74.0 in | Wt 220.4 lb

## 2021-02-15 DIAGNOSIS — L909 Atrophic disorder of skin, unspecified: Secondary | ICD-10-CM

## 2021-02-15 DIAGNOSIS — I255 Ischemic cardiomyopathy: Secondary | ICD-10-CM | POA: Diagnosis not present

## 2021-02-15 DIAGNOSIS — R059 Cough, unspecified: Secondary | ICD-10-CM

## 2021-02-15 NOTE — Progress Notes (Signed)
Established Patient Office Visit  Subjective:  Patient ID: Peter Snyder, male    DOB: Dec 28, 1955  Age: 65 y.o. MRN: [id number]  CC:  Chief Complaint  Patient presents with   Cough    Patient complains of cough, x3 weeks    HPI [facility] presents for some lingering symptoms from respiratory illness which started around 01-25-2021.  He went to urgent care on the 13th.  Was prescribed albuterol and Tessalon.  Apparently also given steroid.  He was seen here by virtual appointment on the 15th and prescribed Hycodan cough syrup.  Still has some lingering dry cough but overall better.  Does not feel particularly sick overall.  Has been walking 2-1/2 miles per day without difficulty at a brisk pace.    Did have nosebleed this morning.  Both nares.  Increased sensitivity to dry air in the past.  He has had some chronic perianal irritation.  History of hemorrhoids.  He has been using steroid topical in this area apparently daily for several months.  Has had some recent irritation.  No recent straining.  No bloody stools.  Past Medical History:  Diagnosis Date   Allergy    Allergy to sulfa drugs 12/10/2020   Arthritis    CAD (coronary artery disease) 6/14   Cardiac catheterization 6/27/ 2014 ejection fraction 35-40%, 30% proximal left circumflex, 100% tiny obtuse marginal 1 with collaterals, 50% LAD, 50% D1, 100% RCA with collaterals   Chicken pox    Chronic back pain    Colon polyps    Depression    Diabetes mellitus ([place])    Diet controlled- OFF metformin 02-01-20   E coli infection 1/69/6789   Folliculitis 3/81/0175   GERD (gastroesophageal reflux disease)    Hyperlipidemia    Hypertension    Kidney stone    Myocardial infarction [facility])    ? year- cath 2014/ june     Past Surgical History:  Procedure Laterality Date   COLONOSCOPY     CRYO INTERCOSTAL NERVE BLOCK     double sacrum nerve block   epidural injections     multiple- L5 S1 and cervical neck pain issues as well     POLYPECTOMY     TONSILLECTOMY  1963   TRIGGER FINGER RELEASE  2019    Family History  Problem Relation Age of Onset   Melanoma Mother        metastatic   Cancer Mother        melanoma   Hypertension Mother    Arthritis Father    Colon polyps Father    COPD Father    Hyperlipidemia Maternal Grandmother    Heart disease Maternal Grandmother    Arthritis Paternal Grandmother    Clotting disorder Paternal Grandmother    Clotting disorder Paternal Grandfather    Liver cancer Paternal Grandfather    Colon cancer Neg Hx    Esophageal cancer Neg Hx    Rectal cancer Neg Hx    Stomach cancer Neg Hx     Social History   Socioeconomic History   Marital status: Married    Spouse name: Not on file   Number of children: 3   Years of education: Not on file   Highest education level: Bachelor's degree (e.g., BA, AB, BS)  Occupational History   Occupation: retired    Comment: Retired  Tobacco Use   Smoking status: Former    Packs/day: 0.25    Years: 28.00    Pack years: 7.00  Types: Cigarettes    Start date: 42    Quit date: 2000    Years since quitting: 22.9   Smokeless tobacco: Never  Vaping Use   Vaping Use: Never used  Substance and Sexual Activity   Alcohol use: No    Alcohol/week: 0.0 standard drinks   Drug use: No   Sexual activity: Not on file  Other Topics Concern   Not on file  Social History Narrative   Not on file   Social Determinants of Health   Financial Resource Strain: Low Risk    Difficulty of Paying Living Expenses: Not hard at all  Food Insecurity: No Food Insecurity   Worried About Charity fundraiser in the Last Year: Never true   [place] in the Last Year: Never true  Transportation Needs: No Transportation Needs   Lack of Transportation (Medical): No   Lack of Transportation (Non-Medical): No  Physical Activity: Sufficiently Active   Days of Exercise per Week: 6 days   Minutes of Exercise per Session: 50 min  Stress: No  Stress Concern Present   Feeling of Stress : Only a little  Social Connections: Engineer, building services of Communication with Friends and Family: More than three times a week   Frequency of Social Gatherings with Friends and Family: More than three times a week   Attends Religious Services: 1 to 4 times per year   Active Member of [employer] or Organizations: Yes   Attends Music therapist: More than 4 times per year   Marital Status: Married  Human resources officer Violence: Not on file    Outpatient Medications Prior to Visit  Medication Sig Dispense Refill   acetaminophen (TYLENOL) 500 MG tablet Take 500 mg by mouth every 6 (six) hours as needed for moderate pain.     albuterol (VENTOLIN HFA) 108 (90 Base) MCG/ACT inhaler Inhale 1-2 puffs into the lungs every 6 (six) hours as needed for wheezing or shortness of breath. 1 each 0   ALPRAZolam (XANAX) 0.25 MG tablet Take 0.25 mg by mouth daily.     aspirin 81 MG tablet Take 81 mg by mouth daily.     atorvastatin (LIPITOR) 80 MG tablet TAKE 1 TABLET BY MOUTH EVERY DAY 90 tablet 1   Azelastine HCl 137 MCG/SPRAY SOLN PLACE 1 SPRAY INTO BOTH NOSTRILS 2 (TWO) TIMES DAILY AS DIRECTED 30 mL 1   benzonatate (TESSALON) 200 MG capsule Take 1 capsule (200 mg total) by mouth 3 (three) times daily as needed for cough. 20 capsule 2   carvedilol (COREG) 12.5 MG tablet TAKE 1 TABLET TWICE A DAY WITH A MEAL 180 tablet 0   celecoxib (CELEBREX) 200 MG capsule Take 200 mg by mouth daily.      clonazePAM (KLONOPIN) 0.5 MG tablet Take 0.5 mg by mouth at bedtime.      diclofenac Sodium (VOLTAREN) 1 % GEL diclofenac 1 % topical gel     escitalopram (LEXAPRO) 20 MG tablet Take 20 mg by mouth daily.     ezetimibe (ZETIA) 10 MG tablet TAKE 1 TABLET BY MOUTH EVERY DAY 90 tablet 1   gabapentin (NEURONTIN) 600 MG tablet TAKE 1 TABLET BY MOUTH TWICE A DAY 180 tablet 1   lisinopril (ZESTRIL) 20 MG tablet Take 1 tablet (20 mg total) by mouth daily. 90 tablet 3    LOTEMAX 0.5 % GEL SMARTSIG:1 Drop(s) In Eye(s) Twice Daily PRN     mupirocin cream (BACTROBAN) 2 % Apply  1 application topically 2 (two) times daily. 15 g 0   pantoprazole (PROTONIX) 40 MG tablet TAKE 1 TABLET BY MOUTH TWICE A DAY 180 tablet 3   RESTASIS 0.05 % ophthalmic emulsion Place 1 drop into both eyes 2 (two) times daily.      Spacer/Aero-Holding Chambers DEVI 1 spacer 1 each 0   tacrolimus (PROTOPIC) 0.1 % ointment APPLY TO AFFECTED AREA ONCE A DAY     tamsulosin (FLOMAX) 0.4 MG CAPS capsule TAKE 1 CAPSULE (0.4 MG TOTAL) BY MOUTH DAILY AFTER SUPPER. 90 capsule 1   zolpidem (AMBIEN) 10 MG tablet Take 10 mg by mouth at bedtime as needed for sleep.     HYDROcodone bit-homatropine (HYCODAN) 5-1.5 MG/5ML syrup Take 5 mLs by mouth every 8 (eight) hours as needed for cough. 120 mL 0   mometasone (NASONEX) 50 MCG/ACT nasal spray Place 2 sprays into the nose daily. (Patient not taking: Reported on 12/10/2020) 1 each 0   No facility-administered medications prior to visit.    Allergies  Allergen Reactions   Latex Other (See Comments)    Other   Tape     Use Paper Tape    Sulfa Antibiotics Rash    ROS Review of Systems  Constitutional:  Negative for chills and fever.  HENT:  Negative for sinus pressure and sinus pain.   Respiratory:  Positive for cough. Negative for shortness of breath and wheezing.   Cardiovascular:  Negative for chest pain.  Gastrointestinal:  Negative for blood in stool and constipation.     Objective:    Physical Exam Vitals reviewed.  Constitutional:      Appearance: Normal appearance.  HENT:     Nose:     Comments: Small amount of dried blood anterior nasal septum bilaterally in the Kiesselbach plexus region.  No active bleeding. Cardiovascular:     Rate and Rhythm: Normal rate and regular rhythm.  Pulmonary:     Effort: Pulmonary effort is normal.     Breath sounds: Normal breath sounds. No wheezing or rales.  Genitourinary:    Comments: Perianal  exam reveals no obvious external hemorrhoids at this time.  He does have somewhat atrophic appearing skin around the anus. Musculoskeletal:     Cervical back: Neck supple.  Lymphadenopathy:     Cervical: No cervical adenopathy.  Neurological:     Mental Status: He is alert.    BP 128/70 (BP Location: Left Arm, Patient Position: Sitting, Cuff Size: Normal)   Pulse (!) 57   Temp 97.7 F (36.5 C) (Oral)   Ht 6\' 2"  (1.88 m)   Wt 220 lb 6.4 oz (100 kg)   SpO2 99%   BMI 28.30 kg/m  Wt Readings from Last 3 Encounters:  02/15/21 220 lb 6.4 oz (100 kg)  12/10/20 226 lb (102.5 kg)  11/13/20 216 lb 12.8 oz (98.3 kg)     Health Maintenance Due  Topic Date Due   OPHTHALMOLOGY EXAM  07/28/2020   HEMOGLOBIN A1C  02/08/2021    There are no preventive care reminders to display for this patient.  Lab Results  Component Value Date   TSH 1.62 03/30/2020   Lab Results  Component Value Date   WBC 4.1 03/30/2020   HGB 13.0 03/30/2020   HCT 38.4 (L) 03/30/2020   MCV 90.6 03/30/2020   PLT 173.0 03/30/2020   Lab Results  Component Value Date   NA 140 03/30/2020   K 4.8 03/30/2020   CO2 31 03/30/2020   GLUCOSE  122 (H) 03/30/2020   BUN 15 03/30/2020   CREATININE 1.14 03/30/2020   BILITOT 0.5 03/30/2020   ALKPHOS 56 03/30/2020   AST 20 03/30/2020   ALT 20 03/30/2020   PROT 6.8 03/30/2020   ALBUMIN 4.8 03/30/2020   CALCIUM 9.6 03/30/2020   ANIONGAP 7 04/03/2014   GFR 67.79 03/30/2020   Lab Results  Component Value Date   CHOL 146 03/30/2020   Lab Results  Component Value Date   HDL 49.10 03/30/2020   Lab Results  Component Value Date   LDLCALC 72 03/30/2020   Lab Results  Component Value Date   TRIG 128.0 03/30/2020   Lab Results  Component Value Date   CHOLHDL 3 03/30/2020   Lab Results  Component Value Date   HGBA1C 6.2 (A) 08/08/2020      Assessment & Plan:   #1 cough lingering from recent likely viral infection.  Nonfocal exam.  Walking for exercise  daily without difficulty.  Reassurance given.  No indication for antibiotics at this time.  Follow-up promptly for any fever, dyspnea, or other concerns  #2 atrophic skin perianal region.  Probably related to chronic topical steroid use.  Recommend avoidance of daily topical steroids to this area  #3 anterior nosebleeds.  Probably related to dryness.  Keep moisturized with normal saline.  He may try some intranasal Bactroban ointment twice daily  No orders of the defined types were placed in this encounter.   Follow-up: No follow-ups on file.    Carolann Littler, MD

## 2021-02-15 NOTE — Patient Instructions (Signed)
Continue with the nasal saline 2-3 times daily  Consider Mupirocin intranasally twice daily.

## 2021-02-19 ENCOUNTER — Encounter: Payer: Self-pay | Admitting: Cardiology

## 2021-02-21 ENCOUNTER — Encounter: Payer: Self-pay | Admitting: Family Medicine

## 2021-02-22 ENCOUNTER — Telehealth: Payer: Self-pay | Admitting: Family Medicine

## 2021-02-22 NOTE — Telephone Encounter (Signed)
Noted. Patient has been responded to via mychart.

## 2021-02-22 NOTE — Telephone Encounter (Signed)
See other note which addresses through patient advice encounters

## 2021-02-22 NOTE — Telephone Encounter (Signed)
Pt has sent mychart message and is calling pt was seen last Friday for hemorrhoids and pt is still experiencing problem and would like to know what is the next step

## 2021-02-27 ENCOUNTER — Ambulatory Visit (INDEPENDENT_AMBULATORY_CARE_PROVIDER_SITE_OTHER): Payer: Medicare Other | Admitting: Family Medicine

## 2021-02-27 VITALS — BP 124/70 | HR 70 | Temp 98.5°F | Wt 216.3 lb

## 2021-02-27 DIAGNOSIS — L309 Dermatitis, unspecified: Secondary | ICD-10-CM | POA: Diagnosis not present

## 2021-02-27 DIAGNOSIS — I255 Ischemic cardiomyopathy: Secondary | ICD-10-CM

## 2021-02-27 NOTE — Patient Instructions (Signed)
Get out of moist/sweaty underwear as soon as possible  May use the Triamcinolone topically twice daily- but try to not use > 2 weeks continuously    Do not see any significant external hemorrhoids at this time.

## 2021-02-27 NOTE — Progress Notes (Signed)
Established Patient Office Visit  Subjective:  Patient ID: Peter Snyder, male    DOB: August 20, 1955  Age: 65 y.o. MRN: [id number]  CC:  Chief Complaint  Patient presents with   Follow-up    HPI [facility] presents for chronic irritation buttock region.  He has seen gastroenterologist multiple times for this previously.  He had colonoscopy 2/22 which showed some benign polyps.  He does have history of external hemorrhoids in the past.  No recent bleeding.  He has had some irritation with wiping.  He uses ultrasensitive wipes and avoids regular toilet tissue.  He has tried recently some RectiCare and Desitin without much improvement.  Best relief is with triamcinolone 0.1% cream.  He has had some recent irritation extending more proximal into the upper buttock region.  He does walk daily and does a power walk and does frequently perspire.  Has been not changing out of underwear immediately afterwards which could be irritating.  He tried some recent Preparation H around the anal region which caused significant burning.  He has lost a little weight recently due to his efforts  Wt Readings from Last 3 Encounters:  02/27/21 216 lb 4.8 oz (98.1 kg)  02/15/21 220 lb 6.4 oz (100 kg)  12/10/20 226 lb (102.5 kg)     Past Medical History:  Diagnosis Date   Allergy    Allergy to sulfa drugs 12/10/2020   Arthritis    CAD (coronary artery disease) 6/14   Cardiac catheterization 6/27/ 2014 ejection fraction 35-40%, 30% proximal left circumflex, 100% tiny obtuse marginal 1 with collaterals, 50% LAD, 50% D1, 100% RCA with collaterals   Chicken pox    Chronic back pain    Colon polyps    Depression    Diabetes mellitus ([place])    Diet controlled- OFF metformin 02-01-20   E coli infection 1/74/0814   Folliculitis 4/81/8563   GERD (gastroesophageal reflux disease)    Hyperlipidemia    Hypertension    Kidney stone    Myocardial infarction [facility])    ? year- cath 2014/ june     Past Surgical  History:  Procedure Laterality Date   COLONOSCOPY     CRYO INTERCOSTAL NERVE BLOCK     double sacrum nerve block   epidural injections     multiple- L5 S1 and cervical neck pain issues as well    POLYPECTOMY     TONSILLECTOMY  1963   TRIGGER FINGER RELEASE  2019    Family History  Problem Relation Age of Onset   Melanoma Mother        metastatic   Cancer Mother        melanoma   Hypertension Mother    Arthritis Father    Colon polyps Father    COPD Father    Hyperlipidemia Maternal Grandmother    Heart disease Maternal Grandmother    Arthritis Paternal Grandmother    Clotting disorder Paternal Grandmother    Clotting disorder Paternal Grandfather    Liver cancer Paternal Grandfather    Colon cancer Neg Hx    Esophageal cancer Neg Hx    Rectal cancer Neg Hx    Stomach cancer Neg Hx     Social History   Socioeconomic History   Marital status: Married    Spouse name: Not on file   Number of children: 3   Years of education: Not on file   Highest education level: Bachelor's degree (e.g., BA, AB, BS)  Occupational History   Occupation:  retired    Comment: Retired  Tobacco Use   Smoking status: Former    Packs/day: 0.25    Years: 28.00    Pack years: 7.00    Types: Cigarettes    Start date: 1972    Quit date: 2000    Years since quitting: 22.9   Smokeless tobacco: Never  Vaping Use   Vaping Use: Never used  Substance and Sexual Activity   Alcohol use: No    Alcohol/week: 0.0 standard drinks   Drug use: No   Sexual activity: Not on file  Other Topics Concern   Not on file  Social History Narrative   Not on file   Social Determinants of Health   Financial Resource Strain: Low Risk    Difficulty of Paying Living Expenses: Not hard at all  Food Insecurity: No Food Insecurity   Worried About Charity fundraiser in the Last Year: Never true   Arboriculturist in the Last Year: Never true  Transportation Needs: No Transportation Needs   Lack of  Transportation (Medical): No   Lack of Transportation (Non-Medical): No  Physical Activity: Sufficiently Active   Days of Exercise per Week: 6 days   Minutes of Exercise per Session: 50 min  Stress: No Stress Concern Present   Feeling of Stress : Only a little  Social Connections: Engineer, building services of Communication with Friends and Family: More than three times a week   Frequency of Social Gatherings with Friends and Family: More than three times a week   Attends Religious Services: 1 to 4 times per year   Active Member of [employer] or Organizations: Yes   Attends Music therapist: More than 4 times per year   Marital Status: Married  Human resources officer Violence: Not on file    Outpatient Medications Prior to Visit  Medication Sig Dispense Refill   acetaminophen (TYLENOL) 500 MG tablet Take 500 mg by mouth every 6 (six) hours as needed for moderate pain.     albuterol (VENTOLIN HFA) 108 (90 Base) MCG/ACT inhaler Inhale 1-2 puffs into the lungs every 6 (six) hours as needed for wheezing or shortness of breath. 1 each 0   ALPRAZolam (XANAX) 0.25 MG tablet Take 0.25 mg by mouth daily.     aspirin 81 MG tablet Take 81 mg by mouth daily.     atorvastatin (LIPITOR) 80 MG tablet TAKE 1 TABLET BY MOUTH EVERY DAY 90 tablet 1   Azelastine HCl 137 MCG/SPRAY SOLN PLACE 1 SPRAY INTO BOTH NOSTRILS 2 (TWO) TIMES DAILY AS DIRECTED 30 mL 1   benzonatate (TESSALON) 200 MG capsule Take 1 capsule (200 mg total) by mouth 3 (three) times daily as needed for cough. 20 capsule 2   carvedilol (COREG) 12.5 MG tablet TAKE 1 TABLET TWICE A DAY WITH A MEAL 180 tablet 0   celecoxib (CELEBREX) 200 MG capsule Take 200 mg by mouth daily.      clonazePAM (KLONOPIN) 0.5 MG tablet Take 0.5 mg by mouth at bedtime.      diclofenac Sodium (VOLTAREN) 1 % GEL diclofenac 1 % topical gel     escitalopram (LEXAPRO) 20 MG tablet Take 20 mg by mouth daily.     ezetimibe (ZETIA) 10 MG tablet TAKE 1 TABLET BY  MOUTH EVERY DAY 90 tablet 1   gabapentin (NEURONTIN) 600 MG tablet TAKE 1 TABLET BY MOUTH TWICE A DAY 180 tablet 1   LOTEMAX 0.5 % GEL SMARTSIG:1 Drop(s) In  Eye(s) Twice Daily PRN     mupirocin cream (BACTROBAN) 2 % Apply 1 application topically 2 (two) times daily. 15 g 0   pantoprazole (PROTONIX) 40 MG tablet TAKE 1 TABLET BY MOUTH TWICE A DAY 180 tablet 3   RESTASIS 0.05 % ophthalmic emulsion Place 1 drop into both eyes 2 (two) times daily.      Spacer/Aero-Holding Chambers DEVI 1 spacer 1 each 0   tacrolimus (PROTOPIC) 0.1 % ointment APPLY TO AFFECTED AREA ONCE A DAY     tamsulosin (FLOMAX) 0.4 MG CAPS capsule TAKE 1 CAPSULE (0.4 MG TOTAL) BY MOUTH DAILY AFTER SUPPER. 90 capsule 1   zolpidem (AMBIEN) 10 MG tablet Take 10 mg by mouth at bedtime as needed for sleep.     lisinopril (ZESTRIL) 20 MG tablet Take 1 tablet (20 mg total) by mouth daily. 90 tablet 3   No facility-administered medications prior to visit.    Allergies  Allergen Reactions   Latex Other (See Comments)    Other   Tape     Use Paper Tape    Sulfa Antibiotics Rash    ROS Review of Systems  Constitutional:  Negative for appetite change and unexpected weight change.  Respiratory:  Negative for shortness of breath.   Cardiovascular:  Negative for chest pain.  Gastrointestinal:  Negative for blood in stool and diarrhea.       Occasional constipation     Objective:    Physical Exam Vitals reviewed.  Cardiovascular:     Rate and Rhythm: Normal rate and regular rhythm.  Pulmonary:     Effort: Pulmonary effort is normal.     Breath sounds: Normal breath sounds.  Genitourinary:    Comments: Perianal exam reveals some skin atrophy around the anal region.  No visible external hemorrhoids at this time.  Actually looks less irritated than last visit around the perianal region but he does have some mild erythema of the skin extending up more superiorly.  No satellite lesions.  No pustules.  No vesicles.  Most of the  erythema is around the buttock crack   BP 124/70 (BP Location: Left Arm, Patient Position: Sitting, Cuff Size: Normal)    Pulse 70    Temp 98.5 F (36.9 C) (Oral)    Wt 216 lb 4.8 oz (98.1 kg)    SpO2 98%    BMI 27.77 kg/m  Wt Readings from Last 3 Encounters:  02/27/21 216 lb 4.8 oz (98.1 kg)  02/15/21 220 lb 6.4 oz (100 kg)  12/10/20 226 lb (102.5 kg)     Health Maintenance Due  Topic Date Due   URINE MICROALBUMIN  03/20/2017   OPHTHALMOLOGY EXAM  07/28/2020   HEMOGLOBIN A1C  02/08/2021    There are no preventive care reminders to display for this patient.  Lab Results  Component Value Date   TSH 1.62 03/30/2020   Lab Results  Component Value Date   WBC 4.1 03/30/2020   HGB 13.0 03/30/2020   HCT 38.4 (L) 03/30/2020   MCV 90.6 03/30/2020   PLT 173.0 03/30/2020   Lab Results  Component Value Date   NA 140 03/30/2020   K 4.8 03/30/2020   CO2 31 03/30/2020   GLUCOSE 122 (H) 03/30/2020   BUN 15 03/30/2020   CREATININE 1.14 03/30/2020   BILITOT 0.5 03/30/2020   ALKPHOS 56 03/30/2020   AST 20 03/30/2020   ALT 20 03/30/2020   PROT 6.8 03/30/2020   ALBUMIN 4.8 03/30/2020   CALCIUM 9.6 03/30/2020  ANIONGAP 7 04/03/2014   GFR 67.79 03/30/2020   Lab Results  Component Value Date   CHOL 146 03/30/2020   Lab Results  Component Value Date   HDL 49.10 03/30/2020   Lab Results  Component Value Date   LDLCALC 72 03/30/2020   Lab Results  Component Value Date   TRIG 128.0 03/30/2020   Lab Results  Component Value Date   CHOLHDL 3 03/30/2020   Lab Results  Component Value Date   HGBA1C 6.2 (A) 08/08/2020      Assessment & Plan:   Perianal skin irritation.  No visible external hemorrhoids at this time.  No visible fissure.  Doubt fungal.  -Keep area dry as possible after exercise by getting out of damp underwear -Short-term trial of triamcinolone 0.1% cream but do not use more than 2 to 3 weeks continuously  No orders of the defined types were placed  in this encounter.   Follow-up: No follow-ups on file.    Carolann Littler, MD

## 2021-02-28 ENCOUNTER — Other Ambulatory Visit: Payer: Self-pay | Admitting: Family Medicine

## 2021-03-04 ENCOUNTER — Encounter: Payer: Self-pay | Admitting: Family Medicine

## 2021-03-04 ENCOUNTER — Telehealth (INDEPENDENT_AMBULATORY_CARE_PROVIDER_SITE_OTHER): Payer: Medicare Other | Admitting: Family Medicine

## 2021-03-04 ENCOUNTER — Other Ambulatory Visit: Payer: Self-pay | Admitting: Family Medicine

## 2021-03-04 VITALS — BP 115/65 | HR 65 | Temp 97.0°F | Ht 74.0 in

## 2021-03-04 DIAGNOSIS — I255 Ischemic cardiomyopathy: Secondary | ICD-10-CM

## 2021-03-04 DIAGNOSIS — J069 Acute upper respiratory infection, unspecified: Secondary | ICD-10-CM

## 2021-03-04 NOTE — Progress Notes (Signed)
Virtual Visit via Video Note I connected with [facility] on 03/04/21 by a video enabled telemedicine application and verified that I am speaking with the correct person using two identifiers.  Location patient: home Location provider:work office Persons participating in the virtual visit: patient, provider  I discussed the limitations of evaluation and management by telemedicine and the availability of in person appointments. The patient expressed understanding and agreed to proceed.  Chief Complaint  Patient presents with   URI    Started yesterday, have been going to a lot of christmas parties. Negative covid test, no fever. Non productive cough, congestion.    HPI: [name] is a 65 yo male with hx of DM II,HTN,CAD,and CHF c/o non productive cough,nasal congestion,and clear  rhinorrhea as described above. Mild fatigue and decreased appetite.  Negative for fever,chills,headache,sore throat,anosmia,ageusia,CP,SOB,wheezing,palpitations,abdominal pain,N/V,changes in bowel habits, body aches,urinary symptoms,or skin rash.  Recently treated for bronchitis, symptoms resolved. He has Benzonatate 200 mg, which is helping with cough.  He has been on a few christmas parties and Friday he was around somebody Dx'ed with COVID 19 infection.  COVID 19 vaccination completed.  ROS: See pertinent positives and negatives per HPI.  Past Medical History:  Diagnosis Date   Allergy    Allergy to sulfa drugs 12/10/2020   Arthritis    CAD (coronary artery disease) 6/14   Cardiac catheterization 6/27/ 2014 ejection fraction 35-40%, 30% proximal left circumflex, 100% tiny obtuse marginal 1 with collaterals, 50% LAD, 50% D1, 100% RCA with collaterals   Chicken pox    Chronic back pain    Colon polyps    Depression    Diabetes mellitus ([place])    Diet controlled- OFF metformin 02-01-20   E coli infection 9/98/3382   Folliculitis 07/20/3974   GERD (gastroesophageal reflux disease)    Hyperlipidemia     Hypertension    Kidney stone    Myocardial infarction [facility])    ? year- cath 2014/ june    Past Surgical History:  Procedure Laterality Date   COLONOSCOPY     CRYO INTERCOSTAL NERVE BLOCK     double sacrum nerve block   epidural injections     multiple- L5 S1 and cervical neck pain issues as well    POLYPECTOMY     TONSILLECTOMY  1963   TRIGGER FINGER RELEASE  2019    Family History  Problem Relation Age of Onset   Melanoma Mother        metastatic   Cancer Mother        melanoma   Hypertension Mother    Arthritis Father    Colon polyps Father    COPD Father    Hyperlipidemia Maternal Grandmother    Heart disease Maternal Grandmother    Arthritis Paternal Grandmother    Clotting disorder Paternal Grandmother    Clotting disorder Paternal Grandfather    Liver cancer Paternal Grandfather    Colon cancer Neg Hx    Esophageal cancer Neg Hx    Rectal cancer Neg Hx    Stomach cancer Neg Hx     Social History   Socioeconomic History   Marital status: Married    Spouse name: Not on file   Number of children: 3   Years of education: Not on file   Highest education level: Bachelor's degree (e.g., BA, AB, BS)  Occupational History   Occupation: retired    Comment: Retired  Tobacco Use   Smoking status: Former    Packs/day: 0.25  Years: 28.00    Pack years: 7.00    Types: Cigarettes    Start date: 97    Quit date: 2000    Years since quitting: 22.9   Smokeless tobacco: Never  Vaping Use   Vaping Use: Never used  Substance and Sexual Activity   Alcohol use: No    Alcohol/week: 0.0 standard drinks   Drug use: No   Sexual activity: Not on file  Other Topics Concern   Not on file  Social History Narrative   Not on file   Social Determinants of Health   Financial Resource Strain: Low Risk    Difficulty of Paying Living Expenses: Not hard at all  Food Insecurity: No Food Insecurity   Worried About Charity fundraiser in the Last Year: Never true   [place] in the Last Year: Never true  Transportation Needs: No Transportation Needs   Lack of Transportation (Medical): No   Lack of Transportation (Non-Medical): No  Physical Activity: Sufficiently Active   Days of Exercise per Week: 6 days   Minutes of Exercise per Session: 50 min  Stress: No Stress Concern Present   Feeling of Stress : Only a little  Social Connections: Engineer, building services of Communication with Friends and Family: More than three times a week   Frequency of Social Gatherings with Friends and Family: More than three times a week   Attends Religious Services: 1 to 4 times per year   Active Member of [employer] or Organizations: Yes   Attends Music therapist: More than 4 times per year   Marital Status: Married  Human resources officer Violence: Not on file    Current Outpatient Medications:    acetaminophen (TYLENOL) 500 MG tablet, Take 500 mg by mouth every 6 (six) hours as needed for moderate pain., Disp: , Rfl:    albuterol (VENTOLIN HFA) 108 (90 Base) MCG/ACT inhaler, INHALE 1-2 PUFFS BY MOUTH EVERY 6 HOURS AS NEEDED FOR WHEEZE OR SHORTNESS OF BREATH, Disp: 6.7 each, Rfl: 0   ALPRAZolam (XANAX) 0.25 MG tablet, Take 0.25 mg by mouth daily., Disp: , Rfl:    aspirin 81 MG tablet, Take 81 mg by mouth daily., Disp: , Rfl:    atorvastatin (LIPITOR) 80 MG tablet, TAKE 1 TABLET BY MOUTH EVERY DAY, Disp: 90 tablet, Rfl: 1   Azelastine HCl 137 MCG/SPRAY SOLN, PLACE 1 SPRAY INTO BOTH NOSTRILS 2 (TWO) TIMES DAILY AS DIRECTED, Disp: 30 mL, Rfl: 1   carvedilol (COREG) 12.5 MG tablet, TAKE 1 TABLET TWICE A DAY WITH A MEAL, Disp: 180 tablet, Rfl: 0   celecoxib (CELEBREX) 200 MG capsule, Take 200 mg by mouth daily. , Disp: , Rfl:    clonazePAM (KLONOPIN) 0.5 MG tablet, Take 0.5 mg by mouth at bedtime. , Disp: , Rfl:    diclofenac Sodium (VOLTAREN) 1 % GEL, diclofenac 1 % topical gel, Disp: , Rfl:    escitalopram (LEXAPRO) 20 MG tablet, Take 20 mg by mouth daily.,  Disp: , Rfl:    ezetimibe (ZETIA) 10 MG tablet, TAKE 1 TABLET BY MOUTH EVERY DAY, Disp: 90 tablet, Rfl: 1   gabapentin (NEURONTIN) 600 MG tablet, TAKE 1 TABLET BY MOUTH TWICE A DAY, Disp: 180 tablet, Rfl: 1   LOTEMAX 0.5 % GEL, SMARTSIG:1 Drop(s) In Eye(s) Twice Daily PRN, Disp: , Rfl:    mupirocin cream (BACTROBAN) 2 %, Apply 1 application topically 2 (two) times daily., Disp: 15 g, Rfl: 0  pantoprazole (PROTONIX) 40 MG tablet, TAKE 1 TABLET BY MOUTH TWICE A DAY, Disp: 180 tablet, Rfl: 3   RESTASIS 0.05 % ophthalmic emulsion, Place 1 drop into both eyes 2 (two) times daily. , Disp: , Rfl:    Spacer/Aero-Holding Chambers DEVI, 1 spacer, Disp: 1 each, Rfl: 0   tacrolimus (PROTOPIC) 0.1 % ointment, APPLY TO AFFECTED AREA ONCE A DAY, Disp: , Rfl:    tamsulosin (FLOMAX) 0.4 MG CAPS capsule, TAKE 1 CAPSULE (0.4 MG TOTAL) BY MOUTH DAILY AFTER SUPPER., Disp: 90 capsule, Rfl: 1   zolpidem (AMBIEN) 10 MG tablet, Take 10 mg by mouth at bedtime as needed for sleep., Disp: , Rfl:    lisinopril (ZESTRIL) 20 MG tablet, Take 1 tablet (20 mg total) by mouth daily., Disp: 90 tablet, Rfl: 3  EXAM:  VITALS per patient if applicable:BP 754/49    Pulse 65    Temp (!) 97 F (36.1 C) (Oral)    Ht 6\' 2"  (1.88 m)    BMI 27.77 kg/m   GENERAL: alert, oriented, appears well and in no acute distress  HEENT: atraumatic, conjunctiva clear, no obvious abnormalities on inspection of external nose and ears  NECK: normal movements of the head and neck  LUNGS: on inspection no signs of respiratory distress, breathing rate appears normal, no obvious gross SOB, gasping or wheezing. Non productive cough a few times during visit.  CV: no obvious cyanosis  MS: moves all visible extremities without noticeable abnormality  PSYCH/NEURO: pleasant and cooperative, no obvious depression or anxiety, speech and thought processing grossly intact  ASSESSMENT AND PLAN:  Discussed the following assessment and plan:  URI, acute We  discussed differential Dx. Having mild symptoms.  For now I recommend continuing symptomatic treatment. Adequate hydration.  Benzonatate 200 mg bid prn for cough. Flonase nasal spray for nasal congestion. Monitor for new symptoms. Contact precautions.  Recommend re-checking home COVID 19 test Wednesday, before if fever. We may also need flu test is febrile and neg Covid. Clearly instructed about warning sign.  We discussed possible serious and likely etiologies, options for evaluation and workup, limitations of telemedicine visit vs in person visit, treatment, treatment risks and precautions. The patient was advised to call back or seek an in-person evaluation if the symptoms worsen or if the condition fails to improve as anticipated. I discussed the assessment and treatment plan with the patient. The patient was provided an opportunity to ask questions and all were answered. The patient agreed with the plan and demonstrated an understanding of the instructions.  Return if symptoms worsen or fail to improve.  Zilda No G. Martinique, MD  [facility]. [place] office.

## 2021-03-05 ENCOUNTER — Telehealth (INDEPENDENT_AMBULATORY_CARE_PROVIDER_SITE_OTHER): Payer: Medicare Other | Admitting: Family Medicine

## 2021-03-05 ENCOUNTER — Encounter: Payer: Self-pay | Admitting: Family Medicine

## 2021-03-05 VITALS — Temp 100.0°F

## 2021-03-05 DIAGNOSIS — I255 Ischemic cardiomyopathy: Secondary | ICD-10-CM

## 2021-03-05 DIAGNOSIS — U071 COVID-19: Secondary | ICD-10-CM

## 2021-03-05 MED ORDER — MOLNUPIRAVIR EUA 200MG CAPSULE: 4.0000 | ORAL_CAPSULE | Freq: Two times a day (BID) | ORAL | 0 refills | Status: AC

## 2021-03-05 NOTE — Patient Instructions (Addendum)
°HOME CARE TIPS: ° ° ° °-I sent the medication(s) we discussed to your pharmacy: °Meds ordered this encounter  °Medications  ° molnupiravir EUA (LAGEVRIO) 200 mg CAPS capsule  °  Sig: Take 4 capsules (800 mg total) by mouth 2 (two) times daily for 5 days.  °  Dispense:  40 capsule  °  Refill:  0  °  ° °-I sent in the Covid19 treatment or referral you requested per our discussion. Please see the information provided below and discuss further with the pharmacist/treatment team.  ° ° °-there is a chance of rebound illness after finishing your treatment. If you become sick again please isolate for an additional 5 days, plus 5 more days of masking.  ° °-can use tylenol if needed for fevers, aches and pains per instructions ° °-can use nasal saline a few times per day if you have nasal congestion ° °-stay hydrated, drink plenty of fluids and eat small healthy meals - avoid dairy ° °-follow up with your doctor in 2-3 days unless improving and feeling better ° °-stay home while sick, except to seek medical care. If you have COVID19, you will likely be contagious for 7-10 days. Flu or Influenza is likely contagious for about 7 days. Other respiratory viral infections remain contagious for 5-10+ days depending on the virus and many other factors. Wear a good mask that fits snugly (such as N95 or KN95) if around others to reduce the risk of transmission. ° °It was nice to meet you today, and I really hope you are feeling better soon. I help  out with telemedicine visits on Tuesdays and Thursdays and am happy to help if you need a follow up virtual visit on those days. Otherwise, if you have any concerns or questions following this visit please schedule a follow up visit with your Primary Care doctor or seek care at a local urgent care clinic to avoid delays in care.  ° ° °Seek in person care or schedule a follow up video visit promptly if your symptoms worsen, new concerns arise or you are not improving with  treatment. Call 911 and/or seek emergency care if your symptoms are severe or life threatening. ° °  °Fact Sheet for Patients And Caregivers °Emergency Use Authorization (EUA) Of LAGEVRIO™ (molnupiravir) capsules For °Coronavirus Disease 2019 (COVID-19) ° °What is the most important information I should know about LAGEVRIO? °LAGEVRIO may cause serious side effects, including: °? LAGEVRIO may cause harm to your unborn baby. It is not known if LAGEVRIO will °harm your baby if you take LAGEVRIO during pregnancy. °o LAGEVRIO is not recommended for use in pregnancy. °o LAGEVRIO has not been studied in pregnancy. LAGEVRIO was studied in pregnant °animals only. When LAGEVRIO was given to pregnant animals, LAGEVRIO caused °harm to their unborn babies. °o You and your healthcare provider may decide that you should take LAGEVRIO during °pregnancy if there are no other COVID-19 treatment options approved or authorized by °the FDA that are accessible or clinically appropriate for you. °o If you and your healthcare provider decide that you should take LAGEVRIO during °pregnancy, you and your healthcare provider should discuss the known and potential °benefits and the potential risks of taking LAGEVRIO during pregnancy. °For individuals who are able to become pregnant: °? You should use a reliable method of birth control (contraception) consistently and correctly °during treatment with LAGEVRIO and for 4 days after the last dose of LAGEVRIO. Talk to °your healthcare provider about reliable birth control methods. °?   Before starting treatment with LAGEVRIO your healthcare provider may do a pregnancy test °to see if you are pregnant before starting treatment with LAGEVRIO. °? Tell your healthcare provider right away if you become pregnant or think you may be °pregnant during treatment with LAGEVRIO. °Pregnancy Surveillance Program: °? There is a pregnancy surveillance program for individuals who take LAGEVRIO during °pregnancy. The  purpose of this program is to collect information about the health of you and °your baby. Talk to your healthcare provider about how to take part in this program. °? If you take LAGEVRIO during pregnancy and you agree to participate in the pregnancy °surveillance program and allow your healthcare provider to share your information with °[employer], then your healthcare provider will report your use of LAGEVRIO °during pregnancy to [employer] by calling [phone] or °pregnancyreporting.msd.com. °For individuals who are sexually active with partners who are able to become pregnant: °? It is not known if LAGEVRIO can affect sperm. While the risk is regarded as low, animal °studies to fully assess the potential for LAGEVRIO to affect the babies of males treated with °LAGEVRIO have not been completed. A reliable method of birth control (contraception) °should be used consistently and correctly during treatment with LAGEVRIO and for at least °3 months after the last dose. The risk to sperm beyond 3 months is not known. Studies to °understand the risk to sperm beyond 3 months are ongoing. Talk to your healthcare provider °about reliable birth control methods. Talk to your healthcare provider if you have questions °or concerns about how LAGEVRIO may affect sperm. °You are being given this fact sheet because your healthcare provider believes it is necessary to °provide you with LAGEVRIO for the treatment of adults with mild-to-moderate coronavirus °disease 2019 (COVID-19) with positive results of direct SARS-CoV-2 viral testing, and °who are at high risk for progression to severe COVID-19 including hospitalization or death, and °for whom other COVID-19 treatment options approved or authorized by the FDA are not °accessible or clinically appropriate. °The U.S. Food and Drug Administration (FDA) has issued an Emergency Use Authorization °(EUA) to make LAGEVRIO available during the COVID-19 pandemic  (for more details about an °EUA please see “What is an Emergency Use Authorization?” at the end of this document). °LAGEVRIO is not an FDA-approved medicine in the [place]. Read this Fact Sheet for °information about LAGEVRIO. Talk to your healthcare provider about your options if you have °any questions. It is your choice to take LAGEVRIO. ° °What is COVID-19? °COVID-19 is caused by a virus called a coronavirus. You can get COVID-19 through close °contact with another person who has the virus. °COVID-19 illnesses have ranged from very mild-to-severe, including illness resulting in death. °While information so far suggests that most COVID-19 illness is mild, serious illness can °happen and may cause some of your other medical conditions to become worse. Older people °and people of all ages with severe, long lasting (chronic) medical conditions like heart disease, °lung disease and diabetes, for example seem to be at higher risk of being hospitalized for °COVID-19. ° °What is LAGEVRIO? °LAGEVRIO is an investigational medicine used to treat mild-to-moderate COVID-19 in adults: °? with positive results of direct SARS-CoV-2 viral testing, and °? who are at high risk for progression to severe COVID-19 including hospitalization or death, °and for whom other COVID-19 treatment options approved or authorized by the FDA are °not accessible or clinically appropriate. °The FDA has authorized the emergency use of LAGEVRIO for   the treatment of mild-tomoderate COVID-19 in adults under an EUA. For more information on EUA, see the “What is °an Emergency Use Authorization (EUA)?” section at the end of this Fact Sheet. °LAGEVRIO is not authorized: °? for use in people less than 18 years of age. °? for prevention of COVID-19. °? for people needing hospitalization for COVID-19. °? for use for longer than 5 consecutive days. ° °What should I tell my healthcare provider before I take LAGEVRIO? °Tell your healthcare provider if  you: °? Have any allergies °? Are breastfeeding or plan to breastfeed °? Have any serious illnesses °? Are taking any medicines (prescription, over-the-counter, vitamins, or herbal products). ° °How do I take LAGEVRIO? °? Take LAGEVRIO exactly as your healthcare provider tells you to take it. °? Take 4 capsules of LAGEVRIO every 12 hours (for example, at 8 am and at 8 pm) °? Take LAGEVRIO for 5 days. It is important that you complete the full 5 days of °treatment with LAGEVRIO. Do not stop taking LAGEVRIO before you complete the full 5 °days of treatment, even if you feel better. °? Take LAGEVRIO with or without food. °? You should stay in isolation for as long as your healthcare provider tells you to. Talk to °your healthcare provider if you are not sure about how to properly isolate while you have °COVID-19. °? Swallow LAGEVRIO capsules whole. Do not open, break, or crush the capsules. If you °cannot swallow capsules whole, tell your healthcare provider. °? What to do if you miss a dose: °o If it has been less than 10 hours since the missed dose, take it as soon as you °remember °o If it has been more than 10 hours since the missed dose, skip the missed dose °and take your dose at the next scheduled time. °? Do not double the dose of LAGEVRIO to make up for a missed dose. ° °What are the important possible side effects of LAGEVRIO? °? See, “What is the most important information I should know about LAGEVRIO?” °? Allergic Reactions. Allergic reactions can happen in people taking LAGEVRIO, even °after only 1 dose. Stop taking LAGEVRIO and call your healthcare provider right away if °you get any of the following symptoms of an allergic reaction: °o hives °o rapid heartbeat °o trouble swallowing or breathing °o swelling of the mouth, lips, or face °o throat tightness °o hoarseness °o skin rash °The most common side effects of LAGEVRIO are: °? diarrhea °? nausea °? dizziness °These are not all the possible side effects  of LAGEVRIO. Not many people have taken °LAGEVRIO. Serious and unexpected side effects may happen. This medicine is still being °studied, so it is possible that all of the risks are not known at this time. ° °What other treatment choices are there? ° Veklury (remdesivir) is FDA-approved as an intravenous (IV) infusion for the treatment of mildto-moderate COVID-19 in certain adults and children. Talk with your doctor to see if Veklury is °appropriate for you. °Like LAGEVRIO, FDA may also allow for the emergency use of other medicines to treat people °with COVID-19. Go to https://www.fda.gov/emergency-preparedness-and-response/mcm-legalregulatory-and-policy-framework/emergency-use-authorization for more information. °It is your choice to be treated or not to be treated with LAGEVRIO. Should you decide not to °take it, it will not change your standard medical care. ° °What if I am breastfeeding? °Breastfeeding is not recommended during treatment with LAGEVRIO and for 4 days after the °last dose of LAGEVRIO. If you are breastfeeding or plan to breastfeed, talk to your healthcare °  provider about your options and specific situation before taking LAGEVRIO. ° °How do I report side effects with LAGEVRIO? °Contact your healthcare provider if you have any side effects that bother you or do not go away. °Report side effects to FDA MedWatch at www.fda.gov/medwatch or call 1-800-FDA-1088 (1- °800-332-1088). ° °How should I store LAGEVRIO? °? Store LAGEVRIO capsules at room temperature between 68°F to 77°F (20°C to 25°C). °? Keep LAGEVRIO and all medicines out of the reach of children and pets. °How can I learn more about COVID-19? °? Ask your healthcare provider. °? Visit www.cdc.gov/COVID19 °? Contact your local or state public health department. °? Call [employer] at 1-800-672-6372 (toll free in the [place] °? Visit www.molnupiravir.com ° °What Is an Emergency Use Authorization (EUA)? °The [place] FDA has made  LAGEVRIO available under an emergency access °mechanism called an Emergency Use Authorization (EUA) The EUA is supported by a °Secretary of Health and Human Service (HHS) declaration that circumstances exist to justify °emergency use of drugs and biological products during the COVID-19 pandemic. °LAGEVRIO for the treatment of mild-to-moderate COVID-19 in adults with positive results of °direct SARS-CoV-2 viral testing, who are at high risk for progression to severe COVID-19, °including hospitalization or death, and for whom alternative COVID-19 treatment options °approved or authorized by FDA are not accessible or clinically appropriate, has not undergone °the same type of review as an FDA-approved product. In issuing an EUA under the COVID-19 °public health emergency, the FDA has determined, among other things, that based on the total °amount of scientific evidence available including data from adequate and well-controlled clinical °trials, if available, it is reasonable to believe that the product may be effective for diagnosing, °treating, or preventing COVID-19, or a serious or life-threatening disease or condition caused by °COVID-19; that the known and potential benefits of the product, when used to diagnose, treat, °or prevent such disease or condition, outweigh the known and potential risks of such product; °and that there are no adequate, approved, and available alternatives. ° All of these criteria must be met to allow for the product to be used in the treatment of patients °during the COVID-19 pandemic. The EUA for LAGEVRIO is in effect for the duration of the °COVID-19 declaration justifying emergency use of LAGEVRIO, unless terminated or revoked °(after which LAGEVRIO may no longer be used under the EUA). °For patent information: www.msd.com/research/patent °Copyright © 2021-2022 [employer] [place], [place] and its affiliates. °All rights reserved. °[id number] °Revised: March  2022 ° °

## 2021-03-05 NOTE — Progress Notes (Signed)
Virtual Visit via Video Note  I connected with Peter Snyder  on 03/05/21 at 11:20 AM EST by a video enabled telemedicine application and verified that I am speaking with the correct person using two identifiers.  Location patient: home, [place] Location provider:work or home office Persons participating in the virtual visit: patient, provider  I discussed the limitations of evaluation and management by telemedicine and the availability of in person appointments. The patient expressed understanding and agreed to proceed.   HPI:  Acute telemedicine visit for Covid19: -Onset: 2 days ago, tested positive for covid yesterday -friends he had been hanging out with had covid -Symptoms include: fevers, body aches, cough, sinus congestion -Denies: CP, SOB, NVD, inability to eat/drink/get out of bed -Pertinent past medical history: see below -Pertinent medication allergies:  Allergies  Allergen Reactions   Latex Other (See Comments)    Other   Tape     Use Paper Tape    Sulfa Antibiotics Rash  -COVID-19 vaccine status: 2 dose and 3 boosters Immunization History  Administered Date(s) Administered   Hepatitis A, Adult 06/22/2014   Influenza Whole 10/15/2012   Influenza,inj,Quad PF,6+ Mos 12/02/2013, 12/08/2014, 11/28/2015, 11/24/2016, 11/17/2017, 10/20/2018, 11/17/2019   Influenza,inj,quad, With Preservative 01/19/2017, 10/20/2018   Influenza-Unspecified 11/25/2020   PFIZER Comirnaty(Gray Top)Covid-19 Tri-Sucrose Vaccine 06/21/2020   PFIZER(Purple Top)SARS-COV-2 Vaccination 04/22/2019, 05/17/2019, 12/14/2019   Pfizer Covid-19 Vaccine Bivalent Booster 59yrs & up 12/10/2020   Pneumococcal Conjugate-13 03/14/2014   Pneumococcal Polysaccharide-23 12/29/2012, 05/25/2020   Td 03/17/2004   Tdap 03/14/2014   Zoster Recombinat (Shingrix) 03/06/2017, 03/08/2018   Zoster, Live 10/16/2011  -last GFR about a year ago in the 36s  ROS: See pertinent positives and negatives per HPI.  Past Medical History:   Diagnosis Date   Allergy    Allergy to sulfa drugs 12/10/2020   Arthritis    CAD (coronary artery disease) 6/14   Cardiac catheterization 6/27/ 2014 ejection fraction 35-40%, 30% proximal left circumflex, 100% tiny obtuse marginal 1 with collaterals, 50% LAD, 50% D1, 100% RCA with collaterals   Chicken pox    Chronic back pain    Colon polyps    Depression    Diabetes mellitus ([place])    Diet controlled- OFF metformin 02-01-20   E coli infection 09/07/7626   Folliculitis 05/30/1759   GERD (gastroesophageal reflux disease)    Hyperlipidemia    Hypertension    Kidney stone    Myocardial infarction [facility])    ? year- cath 2014/ june     Past Surgical History:  Procedure Laterality Date   COLONOSCOPY     CRYO INTERCOSTAL NERVE BLOCK     double sacrum nerve block   epidural injections     multiple- L5 S1 and cervical neck pain issues as well    POLYPECTOMY     TONSILLECTOMY  1963   TRIGGER FINGER RELEASE  2019     Current Outpatient Medications:    acetaminophen (TYLENOL) 500 MG tablet, Take 500 mg by mouth every 6 (six) hours as needed for moderate pain., Disp: , Rfl:    albuterol (VENTOLIN HFA) 108 (90 Base) MCG/ACT inhaler, INHALE 1-2 PUFFS BY MOUTH EVERY 6 HOURS AS NEEDED FOR WHEEZE OR SHORTNESS OF BREATH, Disp: 6.7 each, Rfl: 0   ALPRAZolam (XANAX) 0.25 MG tablet, Take 0.25 mg by mouth daily., Disp: , Rfl:    aspirin 81 MG tablet, Take 81 mg by mouth daily., Disp: , Rfl:    atorvastatin (LIPITOR) 80 MG tablet, TAKE 1 TABLET BY MOUTH  EVERY DAY, Disp: 90 tablet, Rfl: 1   Azelastine HCl 137 MCG/SPRAY SOLN, PLACE 1 SPRAY INTO BOTH NOSTRILS 2 (TWO) TIMES DAILY AS DIRECTED, Disp: 30 mL, Rfl: 1   carvedilol (COREG) 12.5 MG tablet, TAKE 1 TABLET TWICE A DAY WITH A MEAL, Disp: 180 tablet, Rfl: 0   celecoxib (CELEBREX) 200 MG capsule, Take 200 mg by mouth daily. , Disp: , Rfl:    clonazePAM (KLONOPIN) 0.5 MG tablet, Take 0.5 mg by mouth at bedtime. , Disp: , Rfl:    diclofenac Sodium  (VOLTAREN) 1 % GEL, diclofenac 1 % topical gel, Disp: , Rfl:    escitalopram (LEXAPRO) 20 MG tablet, Take 20 mg by mouth daily., Disp: , Rfl:    ezetimibe (ZETIA) 10 MG tablet, TAKE 1 TABLET BY MOUTH EVERY DAY, Disp: 90 tablet, Rfl: 1   gabapentin (NEURONTIN) 600 MG tablet, TAKE 1 TABLET BY MOUTH TWICE A DAY, Disp: 180 tablet, Rfl: 1   LOTEMAX 0.5 % GEL, SMARTSIG:1 Drop(s) In Eye(s) Twice Daily PRN, Disp: , Rfl:    molnupiravir EUA (LAGEVRIO) 200 mg CAPS capsule, Take 4 capsules (800 mg total) by mouth 2 (two) times daily for 5 days., Disp: 40 capsule, Rfl: 0   mupirocin cream (BACTROBAN) 2 %, Apply 1 application topically 2 (two) times daily., Disp: 15 g, Rfl: 0   pantoprazole (PROTONIX) 40 MG tablet, TAKE 1 TABLET BY MOUTH TWICE A DAY, Disp: 180 tablet, Rfl: 3   RESTASIS 0.05 % ophthalmic emulsion, Place 1 drop into both eyes 2 (two) times daily. , Disp: , Rfl:    Spacer/Aero-Holding Chambers DEVI, 1 spacer, Disp: 1 each, Rfl: 0   tacrolimus (PROTOPIC) 0.1 % ointment, APPLY TO AFFECTED AREA ONCE A DAY, Disp: , Rfl:    tamsulosin (FLOMAX) 0.4 MG CAPS capsule, TAKE 1 CAPSULE (0.4 MG TOTAL) BY MOUTH DAILY AFTER SUPPER., Disp: 90 capsule, Rfl: 1   zolpidem (AMBIEN) 10 MG tablet, Take 10 mg by mouth at bedtime as needed for sleep., Disp: , Rfl:    lisinopril (ZESTRIL) 20 MG tablet, Take 1 tablet (20 mg total) by mouth daily., Disp: 90 tablet, Rfl: 3  EXAM:  VITALS per patient if applicable:  GENERAL: alert, oriented, appears well and in no acute distress  HEENT: atraumatic, conjunttiva clear, no obvious abnormalities on inspection of external nose and ears  NECK: normal movements of the head and neck  LUNGS: on inspection no signs of respiratory distress, breathing rate appears normal, no obvious gross SOB, gasping or wheezing  CV: no obvious cyanosis  MS: moves all visible extremities without noticeable abnormality  PSYCH/NEURO: pleasant and cooperative, no obvious depression or anxiety,  speech and thought processing grossly intact  ASSESSMENT AND PLAN:  Discussed the following assessment and plan:  COVID-19   Discussed treatment options and risk of drug interactions, ideal treatment window, potential complications, isolation and precautions for COVID-19.  Discussed possibility of rebound with antivirals and the need to reisolate if it should occur for 5 days. Checked for/reviewed any labs done in the last 90 days with GFR listed in HPI if available. After lengthy discussion, the patient opted for treatment with Legevrio due to being higher risk for complications of covid or severe disease and other factors. Discussed EUA status of this drug and the fact that there is preliminary limited knowledge of risks/interactions/side effects per EUA document vs possible benefits and precautions. This information was shared with patient during the visit and also was provided in patient instructions. Also, advised  that patient discuss risks/interactions and use with pharmacist/treatment team as well.  Other symptomatic care measures summarized in patient instructions. Advised to seek prompt virtual visit follow up or in person care if worsening, new symptoms arise, or if is not improving with treatment. Discussed options for  care if PCP office not available.    I discussed the assessment and treatment plan with the patient. The patient was provided an opportunity to ask questions and all were answered. The patient agreed with the plan and demonstrated an understanding of the instructions.     Lucretia Kern, DO

## 2021-03-06 ENCOUNTER — Encounter: Payer: Self-pay | Admitting: Family Medicine

## 2021-03-06 MED ORDER — ONDANSETRON HCL 8 MG PO TABS: 8.0000 mg | ORAL_TABLET | Freq: Three times a day (TID) | ORAL | 0 refills | Status: DC | PRN

## 2021-03-07 ENCOUNTER — Encounter: Payer: Self-pay | Admitting: Internal Medicine

## 2021-03-07 ENCOUNTER — Telehealth (INDEPENDENT_AMBULATORY_CARE_PROVIDER_SITE_OTHER): Payer: Medicare Other | Admitting: Internal Medicine

## 2021-03-07 VITALS — Wt 206.0 lb

## 2021-03-07 DIAGNOSIS — I255 Ischemic cardiomyopathy: Secondary | ICD-10-CM | POA: Diagnosis not present

## 2021-03-07 DIAGNOSIS — U071 COVID-19: Secondary | ICD-10-CM | POA: Diagnosis not present

## 2021-03-07 NOTE — Progress Notes (Signed)
Virtual Visit via Video Note  I connected with [facility] on 03/07/21 at 11:00 AM EST by a video enabled telemedicine application and verified that I am speaking with the correct person using two identifiers.  Location patient: home Location provider: work office Persons participating in the virtual visit: patient, provider  I discussed the limitations of evaluation and management by telemedicine and the availability of in person appointments. The patient expressed understanding and agreed to proceed.   HPI: He tested positive for COVID-19 on December 19.  On the 20th he had a virtual visit and molnupiravir was prescribed.  Today is day #3 of treatment.  Unfortunately he continues to feel very tired, has been nauseous and has temps up into the upper 100s.  He recommended drinking before the weekend.  He is not having shortness of breath or dyspnea on exertion.   ROS: Constitutional: Positive for fever, chills, diaphoresis, appetite change and fatigue.  HEENT: Denies photophobia, eye pain, redness, hearing loss, , mouth sores, trouble swallowing, neck pain, neck stiffness and tinnitus.   Respiratory: Denies SOB, DOE, chest tightness,  and wheezing.   Cardiovascular: Denies chest pain, palpitations and leg swelling.  Gastrointestinal: Denies nausea, vomiting, abdominal pain, diarrhea, constipation, blood in stool and abdominal distention.  Genitourinary: Denies dysuria, urgency, frequency, hematuria, flank pain and difficulty urinating.  Endocrine: Denies: hot or cold intolerance, sweats, changes in hair or nails, polyuria, polydipsia. Musculoskeletal: Denies myalgias, back pain, joint swelling, arthralgias and gait problem.  Skin: Denies pallor, rash and wound.  Neurological: Denies dizziness, seizures, syncope,  light-headedness, numbness and headaches.  Hematological: Denies adenopathy. Easy bruising, personal or family bleeding history  Psychiatric/Behavioral: Denies suicidal  ideation, mood changes, confusion, nervousness, sleep disturbance and agitation   Past Medical History:  Diagnosis Date   Allergy    Allergy to sulfa drugs 12/10/2020   Arthritis    CAD (coronary artery disease) 6/14   Cardiac catheterization 6/27/ 2014 ejection fraction 35-40%, 30% proximal left circumflex, 100% tiny obtuse marginal 1 with collaterals, 50% LAD, 50% D1, 100% RCA with collaterals   Chicken pox    Chronic back pain    Colon polyps    Depression    Diabetes mellitus ([place])    Diet controlled- OFF metformin 02-01-20   E coli infection 3/87/5643   Folliculitis 06/13/5186   GERD (gastroesophageal reflux disease)    Hyperlipidemia    Hypertension    Kidney stone    Myocardial infarction [facility])    ? year- cath 2014/ june     Past Surgical History:  Procedure Laterality Date   COLONOSCOPY     CRYO INTERCOSTAL NERVE BLOCK     double sacrum nerve block   epidural injections     multiple- L5 S1 and cervical neck pain issues as well    POLYPECTOMY     TONSILLECTOMY  1963   TRIGGER FINGER RELEASE  2019    Family History  Problem Relation Age of Onset   Melanoma Mother        metastatic   Cancer Mother        melanoma   Hypertension Mother    Arthritis Father    Colon polyps Father    COPD Father    Hyperlipidemia Maternal Grandmother    Heart disease Maternal Grandmother    Arthritis Paternal Grandmother    Clotting disorder Paternal Grandmother    Clotting disorder Paternal Grandfather    Liver cancer Paternal Grandfather    Colon cancer Neg  Hx    Esophageal cancer Neg Hx    Rectal cancer Neg Hx    Stomach cancer Neg Hx     SOCIAL HX:   reports that he quit smoking about 22 years ago. His smoking use included cigarettes. He started smoking about 51 years ago. He has a 7.00 pack-year smoking history. He has never used smokeless tobacco. He reports that he does not drink alcohol and does not use drugs.   Current Outpatient Medications:    acetaminophen  (TYLENOL) 500 MG tablet, Take 500 mg by mouth every 6 (six) hours as needed for moderate pain., Disp: , Rfl:    albuterol (VENTOLIN HFA) 108 (90 Base) MCG/ACT inhaler, INHALE 1-2 PUFFS BY MOUTH EVERY 6 HOURS AS NEEDED FOR WHEEZE OR SHORTNESS OF BREATH, Disp: 6.7 each, Rfl: 0   ALPRAZolam (XANAX) 0.25 MG tablet, Take 0.25 mg by mouth daily., Disp: , Rfl:    aspirin 81 MG tablet, Take 81 mg by mouth daily., Disp: , Rfl:    atorvastatin (LIPITOR) 80 MG tablet, TAKE 1 TABLET BY MOUTH EVERY DAY, Disp: 90 tablet, Rfl: 1   Azelastine HCl 137 MCG/SPRAY SOLN, PLACE 1 SPRAY INTO BOTH NOSTRILS 2 (TWO) TIMES DAILY AS DIRECTED, Disp: 30 mL, Rfl: 1   carvedilol (COREG) 12.5 MG tablet, TAKE 1 TABLET TWICE A DAY WITH A MEAL, Disp: 180 tablet, Rfl: 0   celecoxib (CELEBREX) 200 MG capsule, Take 200 mg by mouth daily. , Disp: , Rfl:    clonazePAM (KLONOPIN) 0.5 MG tablet, Take 0.5 mg by mouth at bedtime. , Disp: , Rfl:    diclofenac Sodium (VOLTAREN) 1 % GEL, diclofenac 1 % topical gel, Disp: , Rfl:    escitalopram (LEXAPRO) 20 MG tablet, Take 20 mg by mouth daily., Disp: , Rfl:    ezetimibe (ZETIA) 10 MG tablet, TAKE 1 TABLET BY MOUTH EVERY DAY, Disp: 90 tablet, Rfl: 1   gabapentin (NEURONTIN) 600 MG tablet, TAKE 1 TABLET BY MOUTH TWICE A DAY, Disp: 180 tablet, Rfl: 1   LOTEMAX 0.5 % GEL, SMARTSIG:1 Drop(s) In Eye(s) Twice Daily PRN, Disp: , Rfl:    molnupiravir EUA (LAGEVRIO) 200 mg CAPS capsule, Take 4 capsules (800 mg total) by mouth 2 (two) times daily for 5 days., Disp: 40 capsule, Rfl: 0   mupirocin cream (BACTROBAN) 2 %, Apply 1 application topically 2 (two) times daily., Disp: 15 g, Rfl: 0   ondansetron (ZOFRAN) 8 MG tablet, Take 1 tablet (8 mg total) by mouth every 8 (eight) hours as needed for nausea or vomiting., Disp: 12 tablet, Rfl: 0   pantoprazole (PROTONIX) 40 MG tablet, TAKE 1 TABLET BY MOUTH TWICE A DAY, Disp: 180 tablet, Rfl: 3   RESTASIS 0.05 % ophthalmic emulsion, Place 1 drop into both eyes 2  (two) times daily. , Disp: , Rfl:    Spacer/Aero-Holding Chambers DEVI, 1 spacer, Disp: 1 each, Rfl: 0   tacrolimus (PROTOPIC) 0.1 % ointment, APPLY TO AFFECTED AREA ONCE A DAY, Disp: , Rfl:    tamsulosin (FLOMAX) 0.4 MG CAPS capsule, TAKE 1 CAPSULE (0.4 MG TOTAL) BY MOUTH DAILY AFTER SUPPER., Disp: 90 capsule, Rfl: 1   zolpidem (AMBIEN) 10 MG tablet, Take 10 mg by mouth at bedtime as needed for sleep., Disp: , Rfl:    lisinopril (ZESTRIL) 20 MG tablet, Take 1 tablet (20 mg total) by mouth daily., Disp: 90 tablet, Rfl: 3  EXAM:   VITALS per patient if applicable: None reported  GENERAL: alert, oriented, appears well  and in no acute distress, sounds congested  HEENT: atraumatic, conjunttiva clear, no obvious abnormalities on inspection of external nose and ears  NECK: normal movements of the head and neck  LUNGS: on inspection no signs of respiratory distress, breathing rate appears normal, no obvious gross increased work of breathing, gasping or wheezing  CV: no obvious cyanosis  MS: moves all visible extremities without noticeable abnormality  PSYCH/NEURO: pleasant and cooperative, no obvious depression or anxiety, speech and thought processing grossly intact  ASSESSMENT AND PLAN:   COVID-19  -Suspect this is part of natural course of COVID, too early to consider rebound symptoms. -Have suggested that he complete out his course of molnupiravir.  And he will check back in with [place] next week if symptoms fail to improve.  We have discussed that if he develops shortness of breath over the weekend then emergency services will be necessary.   I discussed the assessment and treatment plan with the patient. The patient was provided an opportunity to ask questions and all were answered. The patient agreed with the plan and demonstrated an understanding of the instructions.   The patient was advised to call back or seek an in-person evaluation if the symptoms worsen or if the condition fails  to improve as anticipated.    Lelon Frohlich, MD  [facility]

## 2021-03-09 ENCOUNTER — Telehealth: Payer: Medicare Other | Admitting: Nurse Practitioner

## 2021-03-09 DIAGNOSIS — U099 Post covid-19 condition, unspecified: Secondary | ICD-10-CM

## 2021-03-09 MED ORDER — PROMETHAZINE-DM 6.25-15 MG/5ML PO SYRP: 5.0000 mL | ORAL_SOLUTION | Freq: Four times a day (QID) | ORAL | 0 refills | Status: DC | PRN

## 2021-03-09 NOTE — Patient Instructions (Signed)
[facility], thank you for joining Gildardo Pounds, NP for today's virtual visit.  While this provider is not your primary care provider (PCP), if your PCP is located in our provider database this encounter information will be shared with them immediately following your visit.  Consent: (Patient) [facility] provided verbal consent for this virtual visit at the beginning of the encounter.  Current Medications:  Current Outpatient Medications:    promethazine-dextromethorphan (PROMETHAZINE-DM) 6.25-15 MG/5ML syrup, Take 5 mLs by mouth 4 (four) times daily as needed for cough., Disp: 240 mL, Rfl: 0   acetaminophen (TYLENOL) 500 MG tablet, Take 500 mg by mouth every 6 (six) hours as needed for moderate pain., Disp: , Rfl:    albuterol (VENTOLIN HFA) 108 (90 Base) MCG/ACT inhaler, INHALE 1-2 PUFFS BY MOUTH EVERY 6 HOURS AS NEEDED FOR WHEEZE OR SHORTNESS OF BREATH, Disp: 6.7 each, Rfl: 0   ALPRAZolam (XANAX) 0.25 MG tablet, Take 0.25 mg by mouth daily., Disp: , Rfl:    aspirin 81 MG tablet, Take 81 mg by mouth daily., Disp: , Rfl:    atorvastatin (LIPITOR) 80 MG tablet, TAKE 1 TABLET BY MOUTH EVERY DAY, Disp: 90 tablet, Rfl: 1   Azelastine HCl 137 MCG/SPRAY SOLN, PLACE 1 SPRAY INTO BOTH NOSTRILS 2 (TWO) TIMES DAILY AS DIRECTED, Disp: 30 mL, Rfl: 1   carvedilol (COREG) 12.5 MG tablet, TAKE 1 TABLET TWICE A DAY WITH A MEAL, Disp: 180 tablet, Rfl: 0   celecoxib (CELEBREX) 200 MG capsule, Take 200 mg by mouth daily. , Disp: , Rfl:    clonazePAM (KLONOPIN) 0.5 MG tablet, Take 0.5 mg by mouth at bedtime. , Disp: , Rfl:    diclofenac Sodium (VOLTAREN) 1 % GEL, diclofenac 1 % topical gel, Disp: , Rfl:    escitalopram (LEXAPRO) 20 MG tablet, Take 20 mg by mouth daily., Disp: , Rfl:    ezetimibe (ZETIA) 10 MG tablet, TAKE 1 TABLET BY MOUTH EVERY DAY, Disp: 90 tablet, Rfl: 1   gabapentin (NEURONTIN) 600 MG tablet, TAKE 1 TABLET BY MOUTH TWICE A DAY, Disp: 180 tablet, Rfl: 1   lisinopril (ZESTRIL) 20 MG  tablet, Take 1 tablet (20 mg total) by mouth daily., Disp: 90 tablet, Rfl: 3   LOTEMAX 0.5 % GEL, SMARTSIG:1 Drop(s) In Eye(s) Twice Daily PRN, Disp: , Rfl:    molnupiravir EUA (LAGEVRIO) 200 mg CAPS capsule, Take 4 capsules (800 mg total) by mouth 2 (two) times daily for 5 days., Disp: 40 capsule, Rfl: 0   mupirocin cream (BACTROBAN) 2 %, Apply 1 application topically 2 (two) times daily., Disp: 15 g, Rfl: 0   ondansetron (ZOFRAN) 8 MG tablet, Take 1 tablet (8 mg total) by mouth every 8 (eight) hours as needed for nausea or vomiting., Disp: 12 tablet, Rfl: 0   pantoprazole (PROTONIX) 40 MG tablet, TAKE 1 TABLET BY MOUTH TWICE A DAY, Disp: 180 tablet, Rfl: 3   RESTASIS 0.05 % ophthalmic emulsion, Place 1 drop into both eyes 2 (two) times daily. , Disp: , Rfl:    Spacer/Aero-Holding Chambers DEVI, 1 spacer, Disp: 1 each, Rfl: 0   tacrolimus (PROTOPIC) 0.1 % ointment, APPLY TO AFFECTED AREA ONCE A DAY, Disp: , Rfl:    tamsulosin (FLOMAX) 0.4 MG CAPS capsule, TAKE 1 CAPSULE (0.4 MG TOTAL) BY MOUTH DAILY AFTER SUPPER., Disp: 90 capsule, Rfl: 1   zolpidem (AMBIEN) 10 MG tablet, Take 10 mg by mouth at bedtime as needed for sleep., Disp: , Rfl:    Medications ordered  in this encounter:  Meds ordered this encounter  Medications   promethazine-dextromethorphan (PROMETHAZINE-DM) 6.25-15 MG/5ML syrup    Sig: Take 5 mLs by mouth 4 (four) times daily as needed for cough.    Dispense:  240 mL    Refill:  0    Order Specific Question:   Supervising Provider    Answer:   Sabra Heck, BRIAN [[id number]]     *If you need refills on other medications prior to your next appointment, please contact your pharmacy*  Follow-Up: Call back or seek an in-person evaluation if the symptoms worsen or if the condition fails to improve as anticipated.  Other Instructions Continue albuterol inhaler every 4-6 hours as needed Reccommended patient purchase pulse oximeter from pharmacy. If sats below 92% at rest or symptoms worsen he  needs to be seen urgently .    If you have been instructed to have an in-person evaluation today at a local Urgent Care facility, please use the link below. It will take you to a list of all of our available [facility], including address, phone number and hours of operation. Please do not delay care.  [facility]  If you or a family member do not have a primary care provider, use the link below to schedule a visit and establish care. When you choose a [facility] primary care physician or advanced practice provider, you gain a long-term partner in health. Find a Primary Care Provider  Learn more about 's in-office and virtual care options: [facility]

## 2021-03-09 NOTE — Progress Notes (Signed)
Virtual Visit Consent   [facility], you are scheduled for a virtual visit with a [place] provider today.     Just as with appointments in the office, your consent must be obtained to participate.  Your consent will be active for this visit and any virtual visit you may have with one of our providers in the next 365 days.     If you have a MyChart account, a copy of this consent can be sent to you electronically.  All virtual visits are billed to your insurance company just like a traditional visit in the office.    As this is a virtual visit, video technology does not allow for your provider to perform a traditional examination.  This may limit your provider's ability to fully assess your condition.  If your provider identifies any concerns that need to be evaluated in person or the need to arrange testing (such as labs, EKG, etc.), we will make arrangements to do so.     Although advances in technology are sophisticated, we cannot ensure that it will always work on either your end or our end.  If the connection with a video visit is poor, the visit may have to be switched to a telephone visit.  With either a video or telephone visit, we are not always able to ensure that we have a secure connection.     I need to obtain your verbal consent now.   Are you willing to proceed with your visit today?    [facility] has provided verbal consent on 03/09/2021 for a virtual visit (video or telephone).   Gildardo Pounds, NP   Date: 03/09/2021 5:59 PM   Virtual Visit via Video Note   I, Gildardo Pounds, connected with  [place]  ([id number], 1955/12/13) on 03/09/21 at  5:45 PM EST by a video-enabled telemedicine application and verified that I am speaking with the correct person using two identifiers.  Location: Patient: Virtual Visit Location Patient: Home Provider: Virtual Visit Location Provider: Home Office   I discussed the limitations of evaluation and management by telemedicine  and the availability of in person appointments. The patient expressed understanding and agreed to proceed.    History of Present Illness: Peter Snyder is a 65 y.o. who identifies as a male who was assigned male at birth, and is being seen today for POST COVID COUGH.  HPI:  Diagnosed with COVID 19 earlier this week. Currently taking molnupiravir (day4/5)  but feels symptoms of body aches, decreased appetite, coughing spells, weakness are still persistent. Coughing is relentless. He can not take prednisone as it makes him feel jittery.   Problems:  Patient Active Problem List   Diagnosis Date Noted   Folliculitis 90/24/0973   Allergy to sulfa drugs 12/10/2020   E coli infection 12/10/2020   Allergic rhinitis 08/03/2020   Type 2 diabetes mellitus with hyperglycemia ([place]) 07/11/2019   Pes anserinus bursitis of left knee 05/20/2018   Pain in left knee 02/26/2018   DDD (degenerative disc disease), cervical 06/02/2017   Degeneration of lumbar intervertebral disc 05/25/2017   Acquired trigger finger of left middle finger 05/08/2017   Pain in finger of left hand 05/08/2017   Trigger finger 03/28/2017   Viral URI 03/16/2015   Gastroenteritis 04/03/2014   Chronic insomnia 03/14/2014   Peripheral neuropathy 09/02/2013   Cardiomyopathy, ischemic 01/18/2013   CAD (coronary artery disease) 12/29/2012   GERD (gastroesophageal reflux disease) 12/29/2012   Essential hypertension, benign 12/29/2012  Dyslipidemia 12/29/2012   Personal history of colonic polyps 12/29/2012   Kidney stones 12/29/2012   Depression 12/29/2012   Degenerative arthritis of lumbar spine 12/29/2012   BPH (benign prostatic hyperplasia) 12/29/2012   Essential tremor 12/29/2012   Prediabetes 12/29/2012    Allergies:  Allergies  Allergen Reactions   Latex Other (See Comments)    Other   Tape     Use Paper Tape    Sulfa Antibiotics Rash   Medications:  Current Outpatient Medications:    promethazine-dextromethorphan  (PROMETHAZINE-DM) 6.25-15 MG/5ML syrup, Take 5 mLs by mouth 4 (four) times daily as needed for cough., Disp: 240 mL, Rfl: 0   acetaminophen (TYLENOL) 500 MG tablet, Take 500 mg by mouth every 6 (six) hours as needed for moderate pain., Disp: , Rfl:    albuterol (VENTOLIN HFA) 108 (90 Base) MCG/ACT inhaler, INHALE 1-2 PUFFS BY MOUTH EVERY 6 HOURS AS NEEDED FOR WHEEZE OR SHORTNESS OF BREATH, Disp: 6.7 each, Rfl: 0   ALPRAZolam (XANAX) 0.25 MG tablet, Take 0.25 mg by mouth daily., Disp: , Rfl:    aspirin 81 MG tablet, Take 81 mg by mouth daily., Disp: , Rfl:    atorvastatin (LIPITOR) 80 MG tablet, TAKE 1 TABLET BY MOUTH EVERY DAY, Disp: 90 tablet, Rfl: 1   Azelastine HCl 137 MCG/SPRAY SOLN, PLACE 1 SPRAY INTO BOTH NOSTRILS 2 (TWO) TIMES DAILY AS DIRECTED, Disp: 30 mL, Rfl: 1   carvedilol (COREG) 12.5 MG tablet, TAKE 1 TABLET TWICE A DAY WITH A MEAL, Disp: 180 tablet, Rfl: 0   celecoxib (CELEBREX) 200 MG capsule, Take 200 mg by mouth daily. , Disp: , Rfl:    clonazePAM (KLONOPIN) 0.5 MG tablet, Take 0.5 mg by mouth at bedtime. , Disp: , Rfl:    diclofenac Sodium (VOLTAREN) 1 % GEL, diclofenac 1 % topical gel, Disp: , Rfl:    escitalopram (LEXAPRO) 20 MG tablet, Take 20 mg by mouth daily., Disp: , Rfl:    ezetimibe (ZETIA) 10 MG tablet, TAKE 1 TABLET BY MOUTH EVERY DAY, Disp: 90 tablet, Rfl: 1   gabapentin (NEURONTIN) 600 MG tablet, TAKE 1 TABLET BY MOUTH TWICE A DAY, Disp: 180 tablet, Rfl: 1   lisinopril (ZESTRIL) 20 MG tablet, Take 1 tablet (20 mg total) by mouth daily., Disp: 90 tablet, Rfl: 3   LOTEMAX 0.5 % GEL, SMARTSIG:1 Drop(s) In Eye(s) Twice Daily PRN, Disp: , Rfl:    molnupiravir EUA (LAGEVRIO) 200 mg CAPS capsule, Take 4 capsules (800 mg total) by mouth 2 (two) times daily for 5 days., Disp: 40 capsule, Rfl: 0   mupirocin cream (BACTROBAN) 2 %, Apply 1 application topically 2 (two) times daily., Disp: 15 g, Rfl: 0   ondansetron (ZOFRAN) 8 MG tablet, Take 1 tablet (8 mg total) by mouth every  8 (eight) hours as needed for nausea or vomiting., Disp: 12 tablet, Rfl: 0   pantoprazole (PROTONIX) 40 MG tablet, TAKE 1 TABLET BY MOUTH TWICE A DAY, Disp: 180 tablet, Rfl: 3   RESTASIS 0.05 % ophthalmic emulsion, Place 1 drop into both eyes 2 (two) times daily. , Disp: , Rfl:    Spacer/Aero-Holding Chambers DEVI, 1 spacer, Disp: 1 each, Rfl: 0   tacrolimus (PROTOPIC) 0.1 % ointment, APPLY TO AFFECTED AREA ONCE A DAY, Disp: , Rfl:    tamsulosin (FLOMAX) 0.4 MG CAPS capsule, TAKE 1 CAPSULE (0.4 MG TOTAL) BY MOUTH DAILY AFTER SUPPER., Disp: 90 capsule, Rfl: 1   zolpidem (AMBIEN) 10 MG tablet, Take 10 mg by  mouth at bedtime as needed for sleep., Disp: , Rfl:   Observations/Objective: Patient is well-developed, well-nourished in no acute distress.  Resting comfortably at home.  Head is normocephalic, atraumatic.  No labored breathing.  Speech is clear and coherent with logical content.  Patient is alert and oriented at baseline.    Assessment and Plan: 1. Post-COVID-19 condition - promethazine-dextromethorphan (PROMETHAZINE-DM) 6.25-15 MG/5ML syrup; Take 5 mLs by mouth 4 (four) times daily as needed for cough.  Dispense: 240 mL; Refill: 0 Reccommended patient purchase pulse oximeter from pharmacy. If sats below 92% at rest or symptoms worsen he needs to be seen urgently .      Follow Up Instructions: I discussed the assessment and treatment plan with the patient. The patient was provided an opportunity to ask questions and all were answered. The patient agreed with the plan and demonstrated an understanding of the instructions.  A copy of instructions were sent to the patient via MyChart unless otherwise noted below.   The patient was advised to call back or seek an in-person evaluation if the symptoms worsen or if the condition fails to improve as anticipated.  Time:  I spent 10 minutes with the patient via telehealth technology discussing the above problems/concerns.    Gildardo Pounds, NP

## 2021-03-12 ENCOUNTER — Telehealth (INDEPENDENT_AMBULATORY_CARE_PROVIDER_SITE_OTHER): Payer: Medicare Other | Admitting: Family Medicine

## 2021-03-12 ENCOUNTER — Encounter: Payer: Self-pay | Admitting: Family Medicine

## 2021-03-12 ENCOUNTER — Telehealth: Payer: Self-pay | Admitting: Family Medicine

## 2021-03-12 VITALS — HR 68 | Wt 204.0 lb

## 2021-03-12 DIAGNOSIS — U071 COVID-19: Secondary | ICD-10-CM

## 2021-03-12 DIAGNOSIS — I255 Ischemic cardiomyopathy: Secondary | ICD-10-CM | POA: Diagnosis not present

## 2021-03-12 MED ORDER — BENZONATATE 100 MG PO CAPS: ORAL_CAPSULE | ORAL | 0 refills | Status: DC

## 2021-03-12 NOTE — Progress Notes (Signed)
Virtual Visit via Video Note  I connected with [place]  on 03/12/21 at 10:00 AM EST by a video enabled telemedicine application and verified that I am speaking with the correct person using two identifiers.  Location patient: home, [place] Location provider:work or home office Persons participating in the virtual visit: patient, provider  I discussed the limitations of evaluation and management by telemedicine and the availability of in person appointments. The patient expressed understanding and agreed to proceed.   HPI:  Acute telemedicine visit for : -Onset: about 7 days ago -Symptoms include: doing somewhat better, but still have pnd and a cough and fatigue - cough is intermittent -still testing positive for covid today and is supposed to travel in 2 days, does not feel well enough to travel  -using alb for the cough but it does not help much -O2 98% -fevers and vomiting have resolved -Denies:CP, SOB, persistent fevers or vomiting -has tried tessalon and promethazine - the tessalon works better but he is out ofthis -Pertinent past medical history:see below -Pertinent medication allergies:  Allergies  Allergen Reactions   Latex Other (See Comments)    Other   Tape     Use Paper Tape    Sulfa Antibiotics Rash  -COVID-19 vaccine status:  Immunization History  Administered Date(s) Administered   Hepatitis A, Adult 06/22/2014   Influenza Whole 10/15/2012   Influenza,inj,Quad PF,6+ Mos 12/02/2013, 12/08/2014, 11/28/2015, 11/24/2016, 11/17/2017, 10/20/2018, 11/17/2019   Influenza,inj,quad, With Preservative 01/19/2017, 10/20/2018   Influenza-Unspecified 11/25/2020   PFIZER Comirnaty(Gray Top)Covid-19 Tri-Sucrose Vaccine 06/21/2020   PFIZER(Purple Top)SARS-COV-2 Vaccination 04/22/2019, 05/17/2019, 12/14/2019   Pfizer Covid-19 Vaccine Bivalent Booster 72yrs & up 12/10/2020   Pneumococcal Conjugate-13 03/14/2014   Pneumococcal Polysaccharide-23 12/29/2012, 05/25/2020   Td 03/17/2004    Tdap 03/14/2014   Zoster Recombinat (Shingrix) 03/06/2017, 03/08/2018   Zoster, Live 10/16/2011    ROS: See pertinent positives and negatives per HPI.  Past Medical History:  Diagnosis Date   Allergy    Allergy to sulfa drugs 12/10/2020   Arthritis    CAD (coronary artery disease) 6/14   Cardiac catheterization 6/27/ 2014 ejection fraction 35-40%, 30% proximal left circumflex, 100% tiny obtuse marginal 1 with collaterals, 50% LAD, 50% D1, 100% RCA with collaterals   Chicken pox    Chronic back pain    Colon polyps    Depression    Diabetes mellitus ([place])    Diet controlled- OFF metformin 02-01-20   E coli infection 09/29/9676   Folliculitis 9/38/1017   GERD (gastroesophageal reflux disease)    Hyperlipidemia    Hypertension    Kidney stone    Myocardial infarction [facility])    ? year- cath 2014/ june     Past Surgical History:  Procedure Laterality Date   COLONOSCOPY     CRYO INTERCOSTAL NERVE BLOCK     double sacrum nerve block   epidural injections     multiple- L5 S1 and cervical neck pain issues as well    POLYPECTOMY     TONSILLECTOMY  1963   TRIGGER FINGER RELEASE  2019     Current Outpatient Medications:    acetaminophen (TYLENOL) 500 MG tablet, Take 500 mg by mouth every 6 (six) hours as needed for moderate pain., Disp: , Rfl:    albuterol (VENTOLIN HFA) 108 (90 Base) MCG/ACT inhaler, INHALE 1-2 PUFFS BY MOUTH EVERY 6 HOURS AS NEEDED FOR WHEEZE OR SHORTNESS OF BREATH, Disp: 6.7 each, Rfl: 0   ALPRAZolam (XANAX) 0.25 MG tablet, Take 0.25 mg  by mouth daily., Disp: , Rfl:    aspirin 81 MG tablet, Take 81 mg by mouth daily., Disp: , Rfl:    atorvastatin (LIPITOR) 80 MG tablet, TAKE 1 TABLET BY MOUTH EVERY DAY, Disp: 90 tablet, Rfl: 1   Azelastine HCl 137 MCG/SPRAY SOLN, PLACE 1 SPRAY INTO BOTH NOSTRILS 2 (TWO) TIMES DAILY AS DIRECTED, Disp: 30 mL, Rfl: 1   benzonatate (TESSALON PERLES) 100 MG capsule, 1-2 capsules up to twice daily as needed for cough, Disp: 30  capsule, Rfl: 0   carvedilol (COREG) 12.5 MG tablet, TAKE 1 TABLET TWICE A DAY WITH A MEAL, Disp: 180 tablet, Rfl: 0   celecoxib (CELEBREX) 200 MG capsule, Take 200 mg by mouth daily. , Disp: , Rfl:    clonazePAM (KLONOPIN) 0.5 MG tablet, Take 0.5 mg by mouth at bedtime. , Disp: , Rfl:    diclofenac Sodium (VOLTAREN) 1 % GEL, diclofenac 1 % topical gel, Disp: , Rfl:    escitalopram (LEXAPRO) 20 MG tablet, Take 20 mg by mouth daily., Disp: , Rfl:    ezetimibe (ZETIA) 10 MG tablet, TAKE 1 TABLET BY MOUTH EVERY DAY, Disp: 90 tablet, Rfl: 1   gabapentin (NEURONTIN) 600 MG tablet, TAKE 1 TABLET BY MOUTH TWICE A DAY, Disp: 180 tablet, Rfl: 1   LOTEMAX 0.5 % GEL, SMARTSIG:1 Drop(s) In Eye(s) Twice Daily PRN, Disp: , Rfl:    mupirocin cream (BACTROBAN) 2 %, Apply 1 application topically 2 (two) times daily., Disp: 15 g, Rfl: 0   ondansetron (ZOFRAN) 8 MG tablet, Take 1 tablet (8 mg total) by mouth every 8 (eight) hours as needed for nausea or vomiting., Disp: 12 tablet, Rfl: 0   pantoprazole (PROTONIX) 40 MG tablet, TAKE 1 TABLET BY MOUTH TWICE A DAY, Disp: 180 tablet, Rfl: 3   promethazine-dextromethorphan (PROMETHAZINE-DM) 6.25-15 MG/5ML syrup, Take 5 mLs by mouth 4 (four) times daily as needed for cough., Disp: 240 mL, Rfl: 0   RESTASIS 0.05 % ophthalmic emulsion, Place 1 drop into both eyes 2 (two) times daily. , Disp: , Rfl:    Spacer/Aero-Holding Chambers DEVI, 1 spacer, Disp: 1 each, Rfl: 0   tacrolimus (PROTOPIC) 0.1 % ointment, APPLY TO AFFECTED AREA ONCE A DAY, Disp: , Rfl:    tamsulosin (FLOMAX) 0.4 MG CAPS capsule, TAKE 1 CAPSULE (0.4 MG TOTAL) BY MOUTH DAILY AFTER SUPPER., Disp: 90 capsule, Rfl: 1   zolpidem (AMBIEN) 10 MG tablet, Take 10 mg by mouth at bedtime as needed for sleep., Disp: , Rfl:    lisinopril (ZESTRIL) 20 MG tablet, Take 1 tablet (20 mg total) by mouth daily., Disp: 90 tablet, Rfl: 3  EXAM:  VITALS per patient if applicable:  GENERAL: alert, oriented, appears well and in  no acute distress  HEENT: atraumatic, conjunttiva clear, no obvious abnormalities on inspection of external nose and ears  NECK: normal movements of the head and neck  LUNGS: on inspection no signs of respiratory distress, breathing rate appears normal, no obvious gross SOB, gasping or wheezing  CV: no obvious cyanosis  MS: moves all visible extremities without noticeable abnormality  PSYCH/NEURO: pleasant and cooperative, no obvious depression or anxiety, speech and thought processing grossly intact  ASSESSMENT AND PLAN:  Discussed the following assessment and plan:  COVID-19  -we discussed possible serious and likely etiologies, options for evaluation and workup, limitations of telemedicine visit vs in person visit, treatment, treatment risks and precautions. Pt is agreeable to treatment via telemedicine at this moment. Ongoing [phone] with continued positivity  on testing and some symptoms remaining, though glad some improvement. Opted for refill on tessalon, advised to continue to rest as able and provided note not to travel this week. I have seen some patients test positive and remain symptomatic for over 2 weeks, not uncommonly with these later strains of omicrons, so this duration of symptoms is not unusually rare from what I have seen with covid.  Advised to seek prompt in person care if worsening, new symptoms arise, or if is not continuing to improve with treatment. Discussed options for inperson care if PCP office not available. Did let this patient know that I only do telemedicine on Tuesdays and Thursdays for [place]. Advised to schedule follow up visit with PCP or [facility] if any further questions or concerns to avoid delays in care.   I discussed the assessment and treatment plan with the patient. The patient was provided an opportunity to ask questions and all were answered. The patient agreed with the plan and demonstrated an understanding of the instructions.     Lucretia Kern,  DO

## 2021-03-12 NOTE — Telephone Encounter (Signed)
Patient has had COVID for 1 week still having cough, congestion, and weakness,  Patient has been scheduled for a virtual visit

## 2021-03-12 NOTE — Patient Instructions (Addendum)
To Whom it May concern:  I saw [facility], DOB 03/16/2056, today for a medical visit and advised that he isolate and rest until feeling better and has a negative covid test. I have advised that he not travel this week.   Sincerely,  Dr. Colin Benton, [place] [phone] Date if medical visit: 03/12/2021  -----------------------------------------------------------------------------------------------------------------------------     -nasal saline twice daily  -humidifier  -I sent the medication(s) we discussed to your pharmacy: Meds ordered this encounter  Medications   benzonatate (TESSALON PERLES) 100 MG capsule    Sig: 1-2 capsules up to twice daily as needed for cough    Dispense:  30 capsule    Refill:  0     I hope you are feeling better soon!  Seek in person care promptly if your symptoms worsen, new concerns arise or you are not continuing to improve with treatment.  It was nice to meet you today. I help [facility] out with telemedicine visits on Tuesdays and Thursdays and am happy to help if you need a virtual follow up visit on those days. Otherwise, if you have any concerns or questions following this visit please schedule a follow up visit with your Primary Care office or seek care at a local urgent care clinic to avoid delays in care.

## 2021-03-14 ENCOUNTER — Encounter: Payer: Self-pay | Admitting: Family Medicine

## 2021-03-14 NOTE — Telephone Encounter (Signed)
Noted  

## 2021-03-22 ENCOUNTER — Other Ambulatory Visit: Payer: Self-pay

## 2021-03-22 ENCOUNTER — Encounter: Payer: Self-pay | Admitting: Family Medicine

## 2021-03-22 DIAGNOSIS — R059 Cough, unspecified: Secondary | ICD-10-CM

## 2021-03-22 MED ORDER — BENZONATATE 100 MG PO CAPS: ORAL_CAPSULE | ORAL | 0 refills | Status: DC

## 2021-03-25 ENCOUNTER — Other Ambulatory Visit: Payer: Self-pay | Admitting: Family Medicine

## 2021-03-25 ENCOUNTER — Encounter: Payer: Self-pay | Admitting: Family Medicine

## 2021-03-27 ENCOUNTER — Other Ambulatory Visit: Payer: Self-pay | Admitting: Family Medicine

## 2021-04-02 ENCOUNTER — Encounter: Payer: Medicare Other | Admitting: Family Medicine

## 2021-04-05 ENCOUNTER — Ambulatory Visit (INDEPENDENT_AMBULATORY_CARE_PROVIDER_SITE_OTHER): Payer: Medicare Other | Admitting: Adult Health

## 2021-04-05 ENCOUNTER — Encounter: Payer: Self-pay | Admitting: Adult Health

## 2021-04-05 VITALS — BP 118/74 | HR 66 | Temp 98.7°F | Ht 74.0 in | Wt 215.8 lb

## 2021-04-05 DIAGNOSIS — K649 Unspecified hemorrhoids: Secondary | ICD-10-CM | POA: Diagnosis not present

## 2021-04-05 MED ORDER — HYDROCORTISONE ACETATE 25 MG RE SUPP: 25.0000 mg | Freq: Two times a day (BID) | RECTAL | 0 refills | Status: DC

## 2021-04-05 NOTE — Progress Notes (Signed)
Subjective:    Patient ID: Peter Snyder, male    DOB: [birth year], 66 y.o.   MRN: [id number]  HPI  66 year old male who  has a past medical history of Allergy, Allergy to sulfa drugs (12/10/2020), Arthritis, CAD (coronary artery disease) (6/14), Chicken pox, Chronic back pain, Colon polyps, Depression, Diabetes mellitus ([place]), E coli infection (06/13/5186), Folliculitis (06/30/6061), GERD (gastroesophageal reflux disease), Hyperlipidemia, Hypertension, Kidney stone, and Myocardial infarction ([place]).  He is being evaluated today for an acute on chronic issue of concern for hemorrhoid. Noticed this hemorrhoid last night. Started using preparation H wipes. Reports itching and burning. Denies bleeding.   Review of Systems See HPI   Past Medical History:  Diagnosis Date   Allergy    Allergy to sulfa drugs 12/10/2020   Arthritis    CAD (coronary artery disease) 6/14   Cardiac catheterization 6/27/ 2014 ejection fraction 35-40%, 30% proximal left circumflex, 100% tiny obtuse marginal 1 with collaterals, 50% LAD, 50% D1, 100% RCA with collaterals   Chicken pox    Chronic back pain    Colon polyps    Depression    Diabetes mellitus ([place])    Diet controlled- OFF metformin 02-01-20   E coli infection 0/16/0109   Folliculitis 06/07/5571   GERD (gastroesophageal reflux disease)    Hyperlipidemia    Hypertension    Kidney stone    Myocardial infarction [facility])    ? year- cath 2014/ june     Social History   Socioeconomic History   Marital status: Married    Spouse name: Not on file   Number of children: 3   Years of education: Not on file   Highest education level: Bachelor's degree (e.g., BA, AB, BS)  Occupational History   Occupation: retired    Comment: Retired  Tobacco Use   Smoking status: Former    Packs/day: 0.25    Years: 28.00    Pack years: 7.00    Types: Cigarettes    Start date: 1972    Quit date: 2000    Years since quitting: 23.0   Smokeless tobacco: Never  Vaping  Use   Vaping Use: Never used  Substance and Sexual Activity   Alcohol use: No    Alcohol/week: 0.0 standard drinks   Drug use: No   Sexual activity: Not on file  Other Topics Concern   Not on file  Social History Narrative   Not on file   Social Determinants of Health   Financial Resource Strain: Low Risk    Difficulty of Paying Living Expenses: Not hard at all  Food Insecurity: No Food Insecurity   Worried About Charity fundraiser in the Last Year: Never true   Arboriculturist in the Last Year: Never true  Transportation Needs: No Transportation Needs   Lack of Transportation (Medical): No   Lack of Transportation (Non-Medical): No  Physical Activity: Sufficiently Active   Days of Exercise per Week: 6 days   Minutes of Exercise per Session: 50 min  Stress: No Stress Concern Present   Feeling of Stress : Only a little  Social Connections: Engineer, building services of Communication with Friends and Family: More than three times a week   Frequency of Social Gatherings with Friends and Family: More than three times a week   Attends Religious Services: 1 to 4 times per year   Active Member of [employer] or Organizations: Yes   Attends Archivist Meetings: More than  4 times per year   Marital Status: Married  Human resources officer Violence: Not on file    Past Surgical History:  Procedure Laterality Date   COLONOSCOPY     CRYO INTERCOSTAL NERVE BLOCK     double sacrum nerve block   epidural injections     multiple- L5 S1 and cervical neck pain issues as well    POLYPECTOMY     TONSILLECTOMY  1963   TRIGGER FINGER RELEASE  2019    Family History  Problem Relation Age of Onset   Melanoma Mother        metastatic   Cancer Mother        melanoma   Hypertension Mother    Arthritis Father    Colon polyps Father    COPD Father    Hyperlipidemia Maternal Grandmother    Heart disease Maternal Grandmother    Arthritis Paternal Grandmother    Clotting disorder  Paternal Grandmother    Clotting disorder Paternal Grandfather    Liver cancer Paternal Grandfather    Colon cancer Neg Hx    Esophageal cancer Neg Hx    Rectal cancer Neg Hx    Stomach cancer Neg Hx     Allergies  Allergen Reactions   Latex Other (See Comments)    Other   Tape     Use Paper Tape    Sulfa Antibiotics Rash    Current Outpatient Medications on File Prior to Visit  Medication Sig Dispense Refill   acetaminophen (TYLENOL) 500 MG tablet Take 500 mg by mouth every 6 (six) hours as needed for moderate pain.     albuterol (VENTOLIN HFA) 108 (90 Base) MCG/ACT inhaler INHALE 1-2 PUFFS BY MOUTH EVERY 6 HOURS AS NEEDED FOR WHEEZE OR SHORTNESS OF BREATH 6.7 each 0   ALPRAZolam (XANAX) 0.25 MG tablet Take 0.25 mg by mouth daily.     aspirin 81 MG tablet Take 81 mg by mouth daily.     atorvastatin (LIPITOR) 80 MG tablet TAKE 1 TABLET BY MOUTH EVERY DAY 90 tablet 1   Azelastine HCl 137 MCG/SPRAY SOLN PLACE 1 SPRAY INTO BOTH NOSTRILS 2 TIMES DAILY AS DIRECTED 30 mL 1   benzonatate (TESSALON PERLES) 100 MG capsule 1-2 capsules up to twice daily as needed for cough 30 capsule 0   carvedilol (COREG) 12.5 MG tablet TAKE 1 TABLET TWICE A DAY WITH A MEAL 180 tablet 0   celecoxib (CELEBREX) 200 MG capsule Take 200 mg by mouth daily.      clonazePAM (KLONOPIN) 0.5 MG tablet Take 0.5 mg by mouth at bedtime.      diclofenac Sodium (VOLTAREN) 1 % GEL diclofenac 1 % topical gel     escitalopram (LEXAPRO) 20 MG tablet Take 20 mg by mouth daily.     ezetimibe (ZETIA) 10 MG tablet TAKE 1 TABLET BY MOUTH EVERY DAY 90 tablet 1   gabapentin (NEURONTIN) 600 MG tablet TAKE 1 TABLET BY MOUTH TWICE A DAY 180 tablet 1   LOTEMAX 0.5 % GEL SMARTSIG:1 Drop(s) In Eye(s) Twice Daily PRN     mupirocin cream (BACTROBAN) 2 % Apply 1 application topically 2 (two) times daily. 15 g 0   ondansetron (ZOFRAN) 8 MG tablet Take 1 tablet (8 mg total) by mouth every 8 (eight) hours as needed for nausea or vomiting. 12  tablet 0   pantoprazole (PROTONIX) 40 MG tablet TAKE 1 TABLET BY MOUTH TWICE A DAY 180 tablet 3   promethazine-dextromethorphan (PROMETHAZINE-DM) 6.25-15 MG/5ML syrup Take  5 mLs by mouth 4 (four) times daily as needed for cough. 240 mL 0   RESTASIS 0.05 % ophthalmic emulsion Place 1 drop into both eyes 2 (two) times daily.      Spacer/Aero-Holding Chambers DEVI 1 spacer 1 each 0   tacrolimus (PROTOPIC) 0.1 % ointment APPLY TO AFFECTED AREA ONCE A DAY     tamsulosin (FLOMAX) 0.4 MG CAPS capsule TAKE 1 CAPSULE (0.4 MG TOTAL) BY MOUTH DAILY AFTER SUPPER. 90 capsule 1   zolpidem (AMBIEN) 10 MG tablet Take 10 mg by mouth at bedtime as needed for sleep.     lisinopril (ZESTRIL) 20 MG tablet Take 1 tablet (20 mg total) by mouth daily. 90 tablet 3   No current facility-administered medications on file prior to visit.    BP 118/74 (BP Location: Left Arm, Patient Position: Sitting, Cuff Size: Normal)    Pulse 66    Temp 98.7 F (37.1 C) (Oral)    Ht 6\' 2"  (1.88 m)    Wt 215 lb 12.8 oz (97.9 kg)    SpO2 99%    BMI 27.71 kg/m       Objective:   Physical Exam Vitals and nursing note reviewed.  Genitourinary:    Rectum: Internal hemorrhoid present. No tenderness, anal fissure or external hemorrhoid. Normal anal tone.        Assessment & Plan:   1. Hemorrhoids, unspecified hemorrhoid type - Warm sitz baths. - hydrocortisone (ANUSOL-HC) 25 MG suppository; Place 1 suppository (25 mg total) rectally 2 (two) times daily.  Dispense: 12 suppository; Refill: 0 - Follow up if no improvement in the next 2-3 days   Dorothyann Peng, NP

## 2021-04-06 ENCOUNTER — Telehealth: Payer: Medicare Other | Admitting: Nurse Practitioner

## 2021-04-06 DIAGNOSIS — K649 Unspecified hemorrhoids: Secondary | ICD-10-CM | POA: Diagnosis not present

## 2021-04-06 NOTE — Progress Notes (Signed)
Virtual Visit Consent   [facility], you are scheduled for a virtual visit with a [place] provider today.     Just as with appointments in the office, your consent must be obtained to participate.  Your consent will be active for this visit and any virtual visit you may have with one of our providers in the next 365 days.     If you have a MyChart account, a copy of this consent can be sent to you electronically.  All virtual visits are billed to your insurance company just like a traditional visit in the office.    As this is a virtual visit, video technology does not allow for your provider to perform a traditional examination.  This may limit your provider's ability to fully assess your condition.  If your provider identifies any concerns that need to be evaluated in person or the need to arrange testing (such as labs, EKG, etc.), we will make arrangements to do so.     Although advances in technology are sophisticated, we cannot ensure that it will always work on either your end or our end.  If the connection with a video visit is poor, the visit may have to be switched to a telephone visit.  With either a video or telephone visit, we are not always able to ensure that we have a secure connection.     I need to obtain your verbal consent now.   Are you willing to proceed with your visit today?    [facility] has provided verbal consent on 04/06/2021 for a virtual visit (video or telephone).   Apolonio Schneiders, FNP   Date: 04/06/2021 2:48 PM   Virtual Visit via Video Note   I, Apolonio Schneiders, connected with  [facility]  ([id number], 1955-10-17) on 04/06/21 at  3:00 PM EST by a video-enabled telemedicine application and verified that I am speaking with the correct person using two identifiers.  Location: Patient: Virtual Visit Location Patient: Home Provider: Virtual Visit Location Provider: Home Office   I discussed the limitations of evaluation and management by telemedicine and the  availability of in person appointments. The patient expressed understanding and agreed to proceed.    History of Present Illness: Peter Snyder is a 66 y.o. who identifies as a male who was assigned male at birth, and is being seen today with complaints of hemorrhoids.   He was seen by his NP yesterday and diagnosed with hemorrhoids they were not bleeding at the time he was assessed, but last night he started to have some bleeding, he has continued to notice blood when wiping since that time with a light drip but not active bleeding   He has started to use the suppositories he was given  Hydrocortisone 25mg   He does take a baby aspirin daily  Does not use a stool softener currently  Does routinely wipe with baby wipes   Problems:  Patient Active Problem List   Diagnosis Date Noted   Folliculitis 77/93/9030   Allergy to sulfa drugs 12/10/2020   E coli infection 12/10/2020   Allergic rhinitis 08/03/2020   Type 2 diabetes mellitus with hyperglycemia ([place]) 07/11/2019   Pes anserinus bursitis of left knee 05/20/2018   Pain in left knee 02/26/2018   DDD (degenerative disc disease), cervical 06/02/2017   Degeneration of lumbar intervertebral disc 05/25/2017   Acquired trigger finger of left middle finger 05/08/2017   Pain in finger of left hand 05/08/2017   Trigger finger 03/28/2017  Viral URI 03/16/2015   Gastroenteritis 04/03/2014   Chronic insomnia 03/14/2014   Peripheral neuropathy 09/02/2013   Cardiomyopathy, ischemic 01/18/2013   CAD (coronary artery disease) 12/29/2012   GERD (gastroesophageal reflux disease) 12/29/2012   Essential hypertension, benign 12/29/2012   Dyslipidemia 12/29/2012   Personal history of colonic polyps 12/29/2012   Kidney stones 12/29/2012   Depression 12/29/2012   Degenerative arthritis of lumbar spine 12/29/2012   BPH (benign prostatic hyperplasia) 12/29/2012   Essential tremor 12/29/2012   Prediabetes 12/29/2012    Allergies:  Allergies   Allergen Reactions   Latex Other (See Comments)    Other   Tape     Use Paper Tape    Sulfa Antibiotics Rash   Medications:  Current Outpatient Medications:    acetaminophen (TYLENOL) 500 MG tablet, Take 500 mg by mouth every 6 (six) hours as needed for moderate pain., Disp: , Rfl:    albuterol (VENTOLIN HFA) 108 (90 Base) MCG/ACT inhaler, INHALE 1-2 PUFFS BY MOUTH EVERY 6 HOURS AS NEEDED FOR WHEEZE OR SHORTNESS OF BREATH, Disp: 6.7 each, Rfl: 0   ALPRAZolam (XANAX) 0.25 MG tablet, Take 0.25 mg by mouth daily., Disp: , Rfl:    aspirin 81 MG tablet, Take 81 mg by mouth daily., Disp: , Rfl:    atorvastatin (LIPITOR) 80 MG tablet, TAKE 1 TABLET BY MOUTH EVERY DAY, Disp: 90 tablet, Rfl: 1   Azelastine HCl 137 MCG/SPRAY SOLN, PLACE 1 SPRAY INTO BOTH NOSTRILS 2 TIMES DAILY AS DIRECTED, Disp: 30 mL, Rfl: 1   benzonatate (TESSALON PERLES) 100 MG capsule, 1-2 capsules up to twice daily as needed for cough, Disp: 30 capsule, Rfl: 0   carvedilol (COREG) 12.5 MG tablet, TAKE 1 TABLET TWICE A DAY WITH A MEAL, Disp: 180 tablet, Rfl: 0   celecoxib (CELEBREX) 200 MG capsule, Take 200 mg by mouth daily. , Disp: , Rfl:    clonazePAM (KLONOPIN) 0.5 MG tablet, Take 0.5 mg by mouth at bedtime. , Disp: , Rfl:    diclofenac Sodium (VOLTAREN) 1 % GEL, diclofenac 1 % topical gel, Disp: , Rfl:    escitalopram (LEXAPRO) 20 MG tablet, Take 20 mg by mouth daily., Disp: , Rfl:    ezetimibe (ZETIA) 10 MG tablet, TAKE 1 TABLET BY MOUTH EVERY DAY, Disp: 90 tablet, Rfl: 1   gabapentin (NEURONTIN) 600 MG tablet, TAKE 1 TABLET BY MOUTH TWICE A DAY, Disp: 180 tablet, Rfl: 1   hydrocortisone (ANUSOL-HC) 25 MG suppository, Place 1 suppository (25 mg total) rectally 2 (two) times daily., Disp: 12 suppository, Rfl: 0   lisinopril (ZESTRIL) 20 MG tablet, Take 1 tablet (20 mg total) by mouth daily., Disp: 90 tablet, Rfl: 3   LOTEMAX 0.5 % GEL, SMARTSIG:1 Drop(s) In Eye(s) Twice Daily PRN, Disp: , Rfl:    mupirocin cream  (BACTROBAN) 2 %, Apply 1 application topically 2 (two) times daily., Disp: 15 g, Rfl: 0   ondansetron (ZOFRAN) 8 MG tablet, Take 1 tablet (8 mg total) by mouth every 8 (eight) hours as needed for nausea or vomiting., Disp: 12 tablet, Rfl: 0   pantoprazole (PROTONIX) 40 MG tablet, TAKE 1 TABLET BY MOUTH TWICE A DAY, Disp: 180 tablet, Rfl: 3   promethazine-dextromethorphan (PROMETHAZINE-DM) 6.25-15 MG/5ML syrup, Take 5 mLs by mouth 4 (four) times daily as needed for cough., Disp: 240 mL, Rfl: 0   RESTASIS 0.05 % ophthalmic emulsion, Place 1 drop into both eyes 2 (two) times daily. , Disp: , Rfl:    Spacer/Aero-Holding Dorise Bullion,  1 spacer, Disp: 1 each, Rfl: 0   tacrolimus (PROTOPIC) 0.1 % ointment, APPLY TO AFFECTED AREA ONCE A DAY, Disp: , Rfl:    tamsulosin (FLOMAX) 0.4 MG CAPS capsule, TAKE 1 CAPSULE (0.4 MG TOTAL) BY MOUTH DAILY AFTER SUPPER., Disp: 90 capsule, Rfl: 1   zolpidem (AMBIEN) 10 MG tablet, Take 10 mg by mouth at bedtime as needed for sleep., Disp: , Rfl:   Observations/Objective: Patient is well-developed, well-nourished in no acute distress.  Resting comfortably at home.  Head is normocephalic, atraumatic.  No labored breathing.  Speech is clear and coherent with logical content.  Patient is alert and oriented at baseline.    Assessment and Plan: 1. Hemorrhoids, unspecified hemorrhoid type Continue suppository as directed May also use PrepH cream external for comfort  Colace OTC daily as needed stop if diarrhea onsets  Increase water intake  Hold aspirin for 1-2 days until bleeding stops  Continue to wipe with baby wipes      Follow Up Instructions: I discussed the assessment and treatment plan with the patient. The patient was provided an opportunity to ask questions and all were answered. The patient agreed with the plan and demonstrated an understanding of the instructions.  A copy of instructions were sent to the patient via MyChart unless otherwise noted below.     The patient was advised to call back or seek an in-person evaluation if the symptoms worsen or if the condition fails to improve as anticipated.  Time:  I spent 10 minutes with the patient via telehealth technology discussing the above problems/concerns.    Apolonio Schneiders, FNP

## 2021-04-08 ENCOUNTER — Ambulatory Visit (INDEPENDENT_AMBULATORY_CARE_PROVIDER_SITE_OTHER): Payer: Medicare Other | Admitting: Family Medicine

## 2021-04-08 VITALS — BP 120/70 | HR 66 | Temp 98.4°F | Wt 213.7 lb

## 2021-04-08 DIAGNOSIS — K644 Residual hemorrhoidal skin tags: Secondary | ICD-10-CM | POA: Diagnosis not present

## 2021-04-08 DIAGNOSIS — K625 Hemorrhage of anus and rectum: Secondary | ICD-10-CM

## 2021-04-08 NOTE — Progress Notes (Signed)
Established Patient Office Visit  Subjective:  Patient ID: Peter Snyder, male    DOB: 06/05/1955  Age: 66 y.o. MRN: [id number]  CC:  Chief Complaint  Patient presents with   Hemorrhoids    Started bleeding Friday, bleeding off and on since, very painful    HPI [facility] presents for some recent rectal bleeding.  He noticed this couple times with wiping and recently after walking.  No heavy bleeding.  He has past history of perianal irritation.  He had colonoscopy 2/22 with several diminutive polyps.  He was seen recently in office and prescribed Anusol HC suppositories.  Apparently no internal hemorrhoids were noted on his most recent colonoscopy.  Denies any recent straining.  He then did subsequent virtual visit and was told to start Colace.  Still has some intermittent bleeding but very light.  He uses not unscented baby wipes because of some previous perianal irritation.  Past Medical History:  Diagnosis Date   Allergy    Allergy to sulfa drugs 12/10/2020   Arthritis    CAD (coronary artery disease) 6/14   Cardiac catheterization 6/27/ 2014 ejection fraction 35-40%, 30% proximal left circumflex, 100% tiny obtuse marginal 1 with collaterals, 50% LAD, 50% D1, 100% RCA with collaterals   Chicken pox    Chronic back pain    Colon polyps    Depression    Diabetes mellitus ([place])    Diet controlled- OFF metformin 02-01-20   E coli infection 2/95/1884   Folliculitis 1/66/0630   GERD (gastroesophageal reflux disease)    Hyperlipidemia    Hypertension    Kidney stone    Myocardial infarction [facility])    ? year- cath 2014/ june     Past Surgical History:  Procedure Laterality Date   COLONOSCOPY     CRYO INTERCOSTAL NERVE BLOCK     double sacrum nerve block   epidural injections     multiple- L5 S1 and cervical neck pain issues as well    POLYPECTOMY     TONSILLECTOMY  1963   TRIGGER FINGER RELEASE  2019    Family History  Problem Relation Age of Onset   Melanoma Mother         metastatic   Cancer Mother        melanoma   Hypertension Mother    Arthritis Father    Colon polyps Father    COPD Father    Hyperlipidemia Maternal Grandmother    Heart disease Maternal Grandmother    Arthritis Paternal Grandmother    Clotting disorder Paternal Grandmother    Clotting disorder Paternal Grandfather    Liver cancer Paternal Grandfather    Colon cancer Neg Hx    Esophageal cancer Neg Hx    Rectal cancer Neg Hx    Stomach cancer Neg Hx     Social History   Socioeconomic History   Marital status: Married    Spouse name: Not on file   Number of children: 3   Years of education: Not on file   Highest education level: Bachelor's degree (e.g., BA, AB, BS)  Occupational History   Occupation: retired    Comment: Retired  Tobacco Use   Smoking status: Former    Packs/day: 0.25    Years: 28.00    Pack years: 7.00    Types: Cigarettes    Start date: 1972    Quit date: 2000    Years since quitting: 23.0   Smokeless tobacco: Never  Vaping Use   Vaping  Use: Never used  Substance and Sexual Activity   Alcohol use: No    Alcohol/week: 0.0 standard drinks   Drug use: No   Sexual activity: Not on file  Other Topics Concern   Not on file  Social History Narrative   Not on file   Social Determinants of Health   Financial Resource Strain: Low Risk    Difficulty of Paying Living Expenses: Not hard at all  Food Insecurity: No Food Insecurity   Worried About Charity fundraiser in the Last Year: Never true   [place] in the Last Year: Never true  Transportation Needs: No Transportation Needs   Lack of Transportation (Medical): No   Lack of Transportation (Non-Medical): No  Physical Activity: Sufficiently Active   Days of Exercise per Week: 6 days   Minutes of Exercise per Session: 50 min  Stress: No Stress Concern Present   Feeling of Stress : Only a little  Social Connections: Engineer, building services of Communication with Friends and  Family: More than three times a week   Frequency of Social Gatherings with Friends and Family: More than three times a week   Attends Religious Services: 1 to 4 times per year   Active Member of [employer] or Organizations: Yes   Attends Music therapist: More than 4 times per year   Marital Status: Married  Human resources officer Violence: Not on file    Outpatient Medications Prior to Visit  Medication Sig Dispense Refill   acetaminophen (TYLENOL) 500 MG tablet Take 500 mg by mouth every 6 (six) hours as needed for moderate pain.     albuterol (VENTOLIN HFA) 108 (90 Base) MCG/ACT inhaler INHALE 1-2 PUFFS BY MOUTH EVERY 6 HOURS AS NEEDED FOR WHEEZE OR SHORTNESS OF BREATH 6.7 each 0   ALPRAZolam (XANAX) 0.25 MG tablet Take 0.25 mg by mouth daily.     aspirin 81 MG tablet Take 81 mg by mouth daily.     atorvastatin (LIPITOR) 80 MG tablet TAKE 1 TABLET BY MOUTH EVERY DAY 90 tablet 1   Azelastine HCl 137 MCG/SPRAY SOLN PLACE 1 SPRAY INTO BOTH NOSTRILS 2 TIMES DAILY AS DIRECTED 30 mL 1   benzonatate (TESSALON PERLES) 100 MG capsule 1-2 capsules up to twice daily as needed for cough 30 capsule 0   carvedilol (COREG) 12.5 MG tablet TAKE 1 TABLET TWICE A DAY WITH A MEAL 180 tablet 0   celecoxib (CELEBREX) 200 MG capsule Take 200 mg by mouth daily.      clonazePAM (KLONOPIN) 0.5 MG tablet Take 0.5 mg by mouth at bedtime.      diclofenac Sodium (VOLTAREN) 1 % GEL diclofenac 1 % topical gel     escitalopram (LEXAPRO) 20 MG tablet Take 20 mg by mouth daily.     ezetimibe (ZETIA) 10 MG tablet TAKE 1 TABLET BY MOUTH EVERY DAY 90 tablet 1   gabapentin (NEURONTIN) 600 MG tablet TAKE 1 TABLET BY MOUTH TWICE A DAY 180 tablet 1   hydrocortisone (ANUSOL-HC) 25 MG suppository Place 1 suppository (25 mg total) rectally 2 (two) times daily. 12 suppository 0   LOTEMAX 0.5 % GEL SMARTSIG:1 Drop(s) In Eye(s) Twice Daily PRN     mupirocin cream (BACTROBAN) 2 % Apply 1 application topically 2 (two) times daily. 15  g 0   ondansetron (ZOFRAN) 8 MG tablet Take 1 tablet (8 mg total) by mouth every 8 (eight) hours as needed for nausea or vomiting. 12 tablet 0  pantoprazole (PROTONIX) 40 MG tablet TAKE 1 TABLET BY MOUTH TWICE A DAY 180 tablet 3   promethazine-dextromethorphan (PROMETHAZINE-DM) 6.25-15 MG/5ML syrup Take 5 mLs by mouth 4 (four) times daily as needed for cough. 240 mL 0   RESTASIS 0.05 % ophthalmic emulsion Place 1 drop into both eyes 2 (two) times daily.      Spacer/Aero-Holding Chambers DEVI 1 spacer 1 each 0   tacrolimus (PROTOPIC) 0.1 % ointment APPLY TO AFFECTED AREA ONCE A DAY     tamsulosin (FLOMAX) 0.4 MG CAPS capsule TAKE 1 CAPSULE (0.4 MG TOTAL) BY MOUTH DAILY AFTER SUPPER. 90 capsule 1   zolpidem (AMBIEN) 10 MG tablet Take 10 mg by mouth at bedtime as needed for sleep.     lisinopril (ZESTRIL) 20 MG tablet Take 1 tablet (20 mg total) by mouth daily. 90 tablet 3   No facility-administered medications prior to visit.    Allergies  Allergen Reactions   Latex Other (See Comments)    Other   Tape     Use Paper Tape    Sulfa Antibiotics Rash    ROS Review of Systems  Constitutional:  Negative for chills and fever.  Respiratory:  Positive for apnea.   Gastrointestinal:  Positive for anal bleeding. Negative for abdominal pain and constipation.     Objective:    Physical Exam Vitals reviewed.  Cardiovascular:     Rate and Rhythm: Normal rate and regular rhythm.  Genitourinary:    Comments: Perianal region examined.  He has an area around the 12 o'clock position of the anus with some clotted blood and what looks like recent external hemorrhoid that has bled.  This looks more typical of hemorrhoid than anal fissure.  No active bleeding at this time.  No other hemorrhoids noted. Neurological:     Mental Status: He is alert.    BP 120/70 (BP Location: Left Arm, Patient Position: Sitting, Cuff Size: Normal)    Pulse 66    Temp 98.4 F (36.9 C) (Oral)    Wt 213 lb 11.2 oz (96.9  kg)    SpO2 99%    BMI 27.44 kg/m  Wt Readings from Last 3 Encounters:  04/08/21 213 lb 11.2 oz (96.9 kg)  04/05/21 215 lb 12.8 oz (97.9 kg)  03/12/21 204 lb (92.5 kg)     Health Maintenance Due  Topic Date Due   URINE MICROALBUMIN  03/20/2017   HEMOGLOBIN A1C  02/08/2021   FOOT EXAM  03/30/2021    There are no preventive care reminders to display for this patient.  Lab Results  Component Value Date   TSH 1.62 03/30/2020   Lab Results  Component Value Date   WBC 4.1 03/30/2020   HGB 13.0 03/30/2020   HCT 38.4 (L) 03/30/2020   MCV 90.6 03/30/2020   PLT 173.0 03/30/2020   Lab Results  Component Value Date   NA 140 03/30/2020   K 4.8 03/30/2020   CO2 31 03/30/2020   GLUCOSE 122 (H) 03/30/2020   BUN 15 03/30/2020   CREATININE 1.14 03/30/2020   BILITOT 0.5 03/30/2020   ALKPHOS 56 03/30/2020   AST 20 03/30/2020   ALT 20 03/30/2020   PROT 6.8 03/30/2020   ALBUMIN 4.8 03/30/2020   CALCIUM 9.6 03/30/2020   ANIONGAP 7 04/03/2014   GFR 67.79 03/30/2020   Lab Results  Component Value Date   CHOL 146 03/30/2020   Lab Results  Component Value Date   HDL 49.10 03/30/2020   Lab Results  Component Value Date   LDLCALC 72 03/30/2020   Lab Results  Component Value Date   TRIG 128.0 03/30/2020   Lab Results  Component Value Date   CHOLHDL 3 03/30/2020   Lab Results  Component Value Date   HGBA1C 6.2 (A) 08/08/2020      Assessment & Plan:   Anal bleeding.  This looks to be from recent external hemorrhoid that has bled and has clotted over.  No evidence for perianal abscess.  No thrombosed hemorrhoid.  -Continue sitz bath couple times daily -Continue measures to reduce constipation -Area above should just take some time to heal and may have intermittent bleeding with bowel movements.  Wipe gently. -Continue walking as tolerated  No orders of the defined types were placed in this encounter.   Follow-up: No follow-ups on file.    Carolann Littler, MD

## 2021-04-08 NOTE — Patient Instructions (Signed)
Continue with sitz baths once or twice daily  Continue with Triamcinolone once daily as needed  Keep up the walking.

## 2021-04-17 ENCOUNTER — Encounter: Payer: Self-pay | Admitting: Cardiology

## 2021-04-17 NOTE — Telephone Encounter (Signed)
Sent message to patient

## 2021-04-22 ENCOUNTER — Other Ambulatory Visit: Payer: Self-pay | Admitting: Family Medicine

## 2021-04-23 ENCOUNTER — Encounter: Payer: Self-pay | Admitting: Cardiology

## 2021-04-29 ENCOUNTER — Encounter: Payer: Self-pay | Admitting: Cardiology

## 2021-04-30 ENCOUNTER — Ambulatory Visit (INDEPENDENT_AMBULATORY_CARE_PROVIDER_SITE_OTHER): Payer: Medicare Other | Admitting: Family Medicine

## 2021-04-30 VITALS — BP 134/78 | HR 78 | Temp 98.4°F | Ht 74.0 in | Wt 210.7 lb

## 2021-04-30 DIAGNOSIS — I2583 Coronary atherosclerosis due to lipid rich plaque: Secondary | ICD-10-CM | POA: Diagnosis not present

## 2021-04-30 DIAGNOSIS — R7303 Prediabetes: Secondary | ICD-10-CM

## 2021-04-30 DIAGNOSIS — I2510 Atherosclerotic heart disease of native coronary artery without angina pectoris: Secondary | ICD-10-CM

## 2021-04-30 DIAGNOSIS — Z125 Encounter for screening for malignant neoplasm of prostate: Secondary | ICD-10-CM | POA: Diagnosis not present

## 2021-04-30 DIAGNOSIS — E785 Hyperlipidemia, unspecified: Secondary | ICD-10-CM | POA: Diagnosis not present

## 2021-04-30 LAB — CBC WITH DIFFERENTIAL/PLATELET
Basophils Absolute: 0 10*3/uL (ref 0.0–0.1)
Basophils Relative: 0.7 % (ref 0.0–3.0)
Eosinophils Absolute: 0 10*3/uL (ref 0.0–0.7)
Eosinophils Relative: 0.6 % (ref 0.0–5.0)
HCT: 38.4 % — ABNORMAL LOW (ref 39.0–52.0)
Hemoglobin: 12.9 g/dL — ABNORMAL LOW (ref 13.0–17.0)
Lymphocytes Relative: 26.6 % (ref 12.0–46.0)
Lymphs Abs: 1.5 10*3/uL (ref 0.7–4.0)
MCHC: 33.6 g/dL (ref 30.0–36.0)
MCV: 92 fl (ref 78.0–100.0)
Monocytes Absolute: 0.4 10*3/uL (ref 0.1–1.0)
Monocytes Relative: 7 % (ref 3.0–12.0)
Neutro Abs: 3.6 10*3/uL (ref 1.4–7.7)
Neutrophils Relative %: 65.1 % (ref 43.0–77.0)
Platelets: 153 10*3/uL (ref 150.0–400.0)
RBC: 4.18 Mil/uL — ABNORMAL LOW (ref 4.22–5.81)
RDW: 14 % (ref 11.5–15.5)
WBC: 5.6 10*3/uL (ref 4.0–10.5)

## 2021-04-30 LAB — HEMOGLOBIN A1C: Hgb A1c MFr Bld: 6.6 % — ABNORMAL HIGH (ref 4.6–6.5)

## 2021-04-30 LAB — MICROALBUMIN / CREATININE URINE RATIO
Creatinine,U: 93.1 mg/dL
Microalb Creat Ratio: 1.9 mg/g (ref 0.0–30.0)
Microalb, Ur: 1.8 mg/dL (ref 0.0–1.9)

## 2021-04-30 LAB — PSA, MEDICARE: PSA: 1.53 ng/ml (ref 0.10–4.00)

## 2021-04-30 MED ORDER — SACUBITRIL-VALSARTAN 49-51 MG PO TABS: 1.0000 | ORAL_TABLET | Freq: Two times a day (BID) | ORAL | 3 refills | Status: DC

## 2021-04-30 NOTE — Progress Notes (Signed)
Established Patient Office Visit  Subjective:  Patient ID: Peter Snyder, male    DOB: [birth year]  Age: 66 y.o. MRN: [id number]  CC: No chief complaint on file.   HPI [facility] presents for physical exam/medical follow-up.  History of CAD, hypertension, hyperlipidemia, type 2 diabetes currently controlled with lifestyle, chronic insomnia, BPH.  Recently went on Entresto.  Blood pressures have been up a little bit since then.  This replaced lisinopril.  Last EF estimate per cardiology 45-50% by Echo 2020.  Still walking regularly at a brick pace (4 mph) with no difficulty.    Health maintenance reviewed  -Vaccines up-to-date -Had colonoscopy last May with recommended 5-year follow-up -Does need microalbumin screen. -Gets yearly eye exams  Family history and social history no significant changes  Past Medical History:  Diagnosis Date   Allergy    Allergy to sulfa drugs 12/10/2020   Arthritis    CAD (coronary artery disease) 6/14   Cardiac catheterization 6/27/ 2014 ejection fraction 35-40%, 30% proximal left circumflex, 100% tiny obtuse marginal 1 with collaterals, 50% LAD, 50% D1, 100% RCA with collaterals   Chicken pox    Chronic back pain    Colon polyps    Depression    Diabetes mellitus ([place])    Diet controlled- OFF metformin 02-01-20   E coli infection 06/07/5571   Folliculitis 05/06/2540   GERD (gastroesophageal reflux disease)    Hyperlipidemia    Hypertension    Kidney stone    Myocardial infarction [facility])    ? year- cath 2014/ june     Past Surgical History:  Procedure Laterality Date   COLONOSCOPY     CRYO INTERCOSTAL NERVE BLOCK     double sacrum nerve block   epidural injections     multiple- L5 S1 and cervical neck pain issues as well    POLYPECTOMY     TONSILLECTOMY  1963   TRIGGER FINGER RELEASE  2019    Family History  Problem Relation Age of Onset   Melanoma Mother        metastatic   Cancer Mother        melanoma   Hypertension Mother     Arthritis Father    Colon polyps Father    COPD Father    Hyperlipidemia Maternal Grandmother    Heart disease Maternal Grandmother    Arthritis Paternal Grandmother    Clotting disorder Paternal Grandmother    Clotting disorder Paternal Grandfather    Liver cancer Paternal Grandfather    Colon cancer Neg Hx    Esophageal cancer Neg Hx    Rectal cancer Neg Hx    Stomach cancer Neg Hx     Social History   Socioeconomic History   Marital status: Married    Spouse name: Not on file   Number of children: 3   Years of education: Not on file   Highest education level: Bachelor's degree (e.g., BA, AB, BS)  Occupational History   Occupation: retired    Comment: Retired  Tobacco Use   Smoking status: Former    Packs/day: 0.25    Years: 28.00    Pack years: 7.00    Types: Cigarettes    Start date: 1972    Quit date: 2000    Years since quitting: 23.1   Smokeless tobacco: Never  Vaping Use   Vaping Use: Never used  Substance and Sexual Activity   Alcohol use: No    Alcohol/week: 0.0 standard drinks   Drug use:  No   Sexual activity: Not on file  Other Topics Concern   Not on file  Social History Narrative   Not on file   Social Determinants of Health   Financial Resource Strain: Low Risk    Difficulty of Paying Living Expenses: Not hard at all  Food Insecurity: No Food Insecurity   Worried About Running Out of Food in the Last Year: Never true   [place] in the Last Year: Never true  Transportation Needs: No Transportation Needs   Lack of Transportation (Medical): No   Lack of Transportation (Non-Medical): No  Physical Activity: Sufficiently Active   Days of Exercise per Week: 6 days   Minutes of Exercise per Session: 50 min  Stress: No Stress Concern Present   Feeling of Stress : Only a little  Social Connections: Engineer, building services of Communication with Friends and Family: More than three times a week   Frequency of Social Gatherings with  Friends and Family: More than three times a week   Attends Religious Services: 1 to 4 times per year   Active Member of [employer] or Organizations: Yes   Attends Music therapist: More than 4 times per year   Marital Status: Married  Human resources officer Violence: Not on file    Outpatient Medications Prior to Visit  Medication Sig Dispense Refill   acetaminophen (TYLENOL) 500 MG tablet Take 500 mg by mouth every 6 (six) hours as needed for moderate pain.     ALPRAZolam (XANAX) 0.25 MG tablet Take 0.25 mg by mouth daily.     aspirin 81 MG tablet Take 81 mg by mouth daily.     atorvastatin (LIPITOR) 80 MG tablet TAKE 1 TABLET BY MOUTH EVERY DAY 90 tablet 1   Azelastine HCl 137 MCG/SPRAY SOLN PLACE 1 SPRAY INTO BOTH NOSTRILS 2 TIMES DAILY AS DIRECTED 30 mL 1   benzonatate (TESSALON PERLES) 100 MG capsule 1-2 capsules up to twice daily as needed for cough 30 capsule 0   carvedilol (COREG) 12.5 MG tablet TAKE 1 TABLET TWICE A DAY WITH A MEAL 180 tablet 0   celecoxib (CELEBREX) 200 MG capsule Take 200 mg by mouth daily.      clonazePAM (KLONOPIN) 0.5 MG tablet Take 0.5 mg by mouth at bedtime.      diclofenac Sodium (VOLTAREN) 1 % GEL diclofenac 1 % topical gel     escitalopram (LEXAPRO) 20 MG tablet Take 20 mg by mouth daily.     ezetimibe (ZETIA) 10 MG tablet TAKE 1 TABLET BY MOUTH EVERY DAY 90 tablet 1   gabapentin (NEURONTIN) 600 MG tablet TAKE 1 TABLET BY MOUTH TWICE A DAY 180 tablet 1   LOTEMAX 0.5 % GEL SMARTSIG:1 Drop(s) In Eye(s) Twice Daily PRN     pantoprazole (PROTONIX) 40 MG tablet TAKE 1 TABLET BY MOUTH TWICE A DAY 180 tablet 3   promethazine-dextromethorphan (PROMETHAZINE-DM) 6.25-15 MG/5ML syrup Take 5 mLs by mouth 4 (four) times daily as needed for cough. 240 mL 0   RESTASIS 0.05 % ophthalmic emulsion Place 1 drop into both eyes 2 (two) times daily.      Spacer/Aero-Holding Chambers DEVI 1 spacer 1 each 0   tacrolimus (PROTOPIC) 0.1 % ointment APPLY TO AFFECTED AREA ONCE A  DAY     tamsulosin (FLOMAX) 0.4 MG CAPS capsule TAKE 1 CAPSULE (0.4 MG TOTAL) BY MOUTH DAILY AFTER SUPPER. 90 capsule 1   zolpidem (AMBIEN) 10 MG tablet Take 10 mg by  mouth at bedtime as needed for sleep.     lisinopril (ZESTRIL) 20 MG tablet Take 1 tablet (20 mg total) by mouth daily. 90 tablet 3   albuterol (VENTOLIN HFA) 108 (90 Base) MCG/ACT inhaler INHALE 1-2 PUFFS BY MOUTH EVERY 6 HOURS AS NEEDED FOR WHEEZE OR SHORTNESS OF BREATH 6.7 each 0   hydrocortisone (ANUSOL-HC) 25 MG suppository Place 1 suppository (25 mg total) rectally 2 (two) times daily. 12 suppository 0   mupirocin cream (BACTROBAN) 2 % Apply 1 application topically 2 (two) times daily. 15 g 0   ondansetron (ZOFRAN) 8 MG tablet Take 1 tablet (8 mg total) by mouth every 8 (eight) hours as needed for nausea or vomiting. 12 tablet 0   No facility-administered medications prior to visit.    Allergies  Allergen Reactions   Latex Other (See Comments)    Other   Tape     Use Paper Tape    Sulfa Antibiotics Rash    ROS Review of Systems  Constitutional:  Negative for fatigue.  Eyes:  Negative for visual disturbance.  Respiratory:  Negative for cough, chest tightness and shortness of breath.   Cardiovascular:  Negative for chest pain, palpitations and leg swelling.  Neurological:  Negative for dizziness, syncope, weakness, light-headedness and headaches.     Objective:    Physical Exam Constitutional:      Appearance: He is well-developed.  HENT:     Right Ear: External ear normal.     Left Ear: External ear normal.  Eyes:     Pupils: Pupils are equal, round, and reactive to light.  Neck:     Thyroid: No thyromegaly.  Cardiovascular:     Rate and Rhythm: Normal rate and regular rhythm.  Pulmonary:     Effort: Pulmonary effort is normal. No respiratory distress.     Breath sounds: Normal breath sounds. No wheezing or rales.  Abdominal:     Palpations: Abdomen is soft. There is no mass.     Tenderness: There  is no abdominal tenderness.  Musculoskeletal:     Cervical back: Neck supple.     Right lower leg: No edema.     Left lower leg: No edema.  Neurological:     Mental Status: He is alert and oriented to person, place, and time.    BP 134/78 (BP Location: Left Arm, Cuff Size: Normal)    Pulse 78    Temp 98.4 F (36.9 C) (Oral)    Ht 6\' 2"  (1.88 m)    Wt 210 lb 11.2 oz (95.6 kg)    SpO2 98%    BMI 27.05 kg/m  Wt Readings from Last 3 Encounters:  04/30/21 210 lb 11.2 oz (95.6 kg)  04/08/21 213 lb 11.2 oz (96.9 kg)  04/05/21 215 lb 12.8 oz (97.9 kg)     Health Maintenance Due  Topic Date Due   URINE MICROALBUMIN  03/20/2017   HEMOGLOBIN A1C  02/08/2021   FOOT EXAM  03/30/2021    There are no preventive care reminders to display for this patient.  Lab Results  Component Value Date   TSH 1.62 03/30/2020   Lab Results  Component Value Date   WBC 4.1 03/30/2020   HGB 13.0 03/30/2020   HCT 38.4 (L) 03/30/2020   MCV 90.6 03/30/2020   PLT 173.0 03/30/2020   Lab Results  Component Value Date   NA 140 03/30/2020   K 4.8 03/30/2020   CO2 31 03/30/2020   GLUCOSE 122 (H) 03/30/2020  BUN 15 03/30/2020   CREATININE 1.14 03/30/2020   BILITOT 0.5 03/30/2020   ALKPHOS 56 03/30/2020   AST 20 03/30/2020   ALT 20 03/30/2020   PROT 6.8 03/30/2020   ALBUMIN 4.8 03/30/2020   CALCIUM 9.6 03/30/2020   ANIONGAP 7 04/03/2014   GFR 67.79 03/30/2020   Lab Results  Component Value Date   CHOL 146 03/30/2020   Lab Results  Component Value Date   HDL 49.10 03/30/2020   Lab Results  Component Value Date   LDLCALC 72 03/30/2020   Lab Results  Component Value Date   TRIG 128.0 03/30/2020   Lab Results  Component Value Date   CHOLHDL 3 03/30/2020   Lab Results  Component Value Date   HGBA1C 6.2 (A) 08/08/2020      Assessment & Plan:   Problem List Items Addressed This Visit       Unprioritized   CAD (coronary artery disease) - Primary   Relevant Orders   Lipid panel    Hepatic function panel   Dyslipidemia   Prediabetes   Relevant Orders   Basic metabolic panel   CBC with Differential/Platelet   Microalbumin / creatinine urine ratio   Hemoglobin A1c   Other Visit Diagnoses     Prostate cancer screening       Relevant Orders   PSA, Medicare      -Obtain follow-up labs as above -If A1c climbing consider SGLT 2 or GLP-1 medication given his history of CAD -Continue with yearly eye exam -He is following up soon with cardiology regarding his blood pressure readings with recent change to [facility]. Continue with regular exercise habits.   -immunizations/other health maintenance up to date.     No orders of the defined types were placed in this encounter.   Follow-up: No follow-ups on file.    Carolann Littler, MD

## 2021-05-01 ENCOUNTER — Encounter: Payer: Self-pay | Admitting: Family Medicine

## 2021-05-01 ENCOUNTER — Encounter: Payer: Self-pay | Admitting: Cardiology

## 2021-05-01 LAB — LIPID PANEL
Cholesterol: 152 mg/dL (ref 0–200)
HDL: 57.4 mg/dL (ref >39.00)
LDL Cholesterol: 75 mg/dL (ref 0–99)
NonHDL: 94.78
Total CHOL/HDL Ratio: 3
Triglycerides: 100 mg/dL (ref 0.0–149.0)
VLDL: 20 mg/dL (ref 0.0–40.0)

## 2021-05-01 LAB — HEPATIC FUNCTION PANEL
ALT: 16 U/L (ref 0–53)
AST: 16 U/L (ref 0–37)
Albumin: 4.4 g/dL (ref 3.5–5.2)
Alkaline Phosphatase: 49 U/L (ref 39–117)
Bilirubin, Direct: 0.1 mg/dL (ref 0.0–0.3)
Total Bilirubin: 0.5 mg/dL (ref 0.2–1.2)
Total Protein: 6.6 g/dL (ref 6.0–8.3)

## 2021-05-01 LAB — BASIC METABOLIC PANEL
BUN: 13 mg/dL (ref 6–23)
CO2: 31 mEq/L (ref 19–32)
Calcium: 9.3 mg/dL (ref 8.4–10.5)
Chloride: 104 mEq/L (ref 96–112)
Creatinine, Ser: 0.95 mg/dL (ref 0.40–1.50)
GFR: 83.72 mL/min (ref >60.00)
Glucose, Bld: 133 mg/dL — ABNORMAL HIGH (ref 70–99)
Potassium: 4.2 mEq/L (ref 3.5–5.1)
Sodium: 140 mEq/L (ref 135–145)

## 2021-05-02 ENCOUNTER — Telehealth: Payer: Self-pay | Admitting: Family Medicine

## 2021-05-02 NOTE — Telephone Encounter (Signed)
See result notes. 

## 2021-05-02 NOTE — Telephone Encounter (Signed)
Pt call and stated he is returning your call and want a call back. 

## 2021-05-03 ENCOUNTER — Other Ambulatory Visit: Payer: Self-pay

## 2021-05-03 DIAGNOSIS — R7303 Prediabetes: Secondary | ICD-10-CM

## 2021-05-06 ENCOUNTER — Encounter: Payer: Self-pay | Admitting: Cardiology

## 2021-05-06 ENCOUNTER — Ambulatory Visit (INDEPENDENT_AMBULATORY_CARE_PROVIDER_SITE_OTHER): Payer: Medicare Other | Admitting: Family Medicine

## 2021-05-06 ENCOUNTER — Encounter: Payer: Self-pay | Admitting: Family Medicine

## 2021-05-06 VITALS — BP 158/80 | HR 70 | Temp 98.2°F | Ht 74.0 in | Wt 210.0 lb

## 2021-05-06 DIAGNOSIS — I2510 Atherosclerotic heart disease of native coronary artery without angina pectoris: Secondary | ICD-10-CM

## 2021-05-06 DIAGNOSIS — K6289 Other specified diseases of anus and rectum: Secondary | ICD-10-CM | POA: Diagnosis not present

## 2021-05-06 DIAGNOSIS — E1165 Type 2 diabetes mellitus with hyperglycemia: Secondary | ICD-10-CM | POA: Diagnosis not present

## 2021-05-06 DIAGNOSIS — I10 Essential (primary) hypertension: Secondary | ICD-10-CM | POA: Diagnosis not present

## 2021-05-06 DIAGNOSIS — I2583 Coronary atherosclerosis due to lipid rich plaque: Secondary | ICD-10-CM | POA: Diagnosis not present

## 2021-05-06 NOTE — Progress Notes (Signed)
Established Patient Office Visit  Subjective:  Patient ID: Peter Snyder, male    DOB: [birth year]  Age: 66 y.o. MRN: [id number]  CC:  Chief Complaint  Patient presents with   Fall   Recurrent Skin Infections    HPI [facility] presents for possible "boil "left buttock area.  Last week he and his wife drove down to [facility] to pick up couple of grandchildren and so he rode for several hours.  He noticed some discomfort left buttock region.  He has had bowls in the past.  He started using some leftover topical clindamycin.  Symptoms slightly improved today.  He has some chronic perianal skin irritation and is seen multiple dermatologist and GI in the past.  Has some chronic skin atrophy from steroid use in that region.  Recent physical labs reviewed.  His A1c was up to 6.6%.  We did discuss possible SGLT2 medication such as Jardiance but he prefers 19-month trial of lifestyle modification and reassessing.  He plans to meet with a nutritionist soon.  He does have history of impaired ejection fraction it would be a good candidate for SGLT2 medication if necessary.  Just recently started on Entresto.  Blood pressure is up today.  He feels like his blood pressure was responding much better to the lisinopril previously  Past Medical History:  Diagnosis Date   Allergy    Allergy to sulfa drugs 12/10/2020   Arthritis    CAD (coronary artery disease) 6/14   Cardiac catheterization 6/27/ 2014 ejection fraction 35-40%, 30% proximal left circumflex, 100% tiny obtuse marginal 1 with collaterals, 50% LAD, 50% D1, 100% RCA with collaterals   Chicken pox    Chronic back pain    Colon polyps    Depression    Diabetes mellitus ([place])    Diet controlled- OFF metformin 02-01-20   E coli infection 0/48/8891   Folliculitis 6/94/5038   GERD (gastroesophageal reflux disease)    Hyperlipidemia    Hypertension    Kidney stone    Myocardial infarction [facility])    ? year- cath 2014/ june      Past Surgical History:  Procedure Laterality Date   COLONOSCOPY     CRYO INTERCOSTAL NERVE BLOCK     double sacrum nerve block   epidural injections     multiple- L5 S1 and cervical neck pain issues as well    POLYPECTOMY     TONSILLECTOMY  1963   TRIGGER FINGER RELEASE  2019    Family History  Problem Relation Age of Onset   Melanoma Mother        metastatic   Cancer Mother        melanoma   Hypertension Mother    Arthritis Father    Colon polyps Father    COPD Father    Hyperlipidemia Maternal Grandmother    Heart disease Maternal Grandmother    Arthritis Paternal Grandmother    Clotting disorder Paternal Grandmother    Clotting disorder Paternal Grandfather    Liver cancer Paternal Grandfather    Colon cancer Neg Hx    Esophageal cancer Neg Hx    Rectal cancer Neg Hx    Stomach cancer Neg Hx     Social History   Socioeconomic History   Marital status: Married    Spouse name: Not on file   Number of children: 3   Years of education: Not on file   Highest education level: Bachelor's degree (e.g., BA, AB, BS)  Occupational  History   Occupation: retired    Comment: Retired  Tobacco Use   Smoking status: Former    Packs/day: 0.25    Years: 28.00    Pack years: 7.00    Types: Cigarettes    Start date: 1972    Quit date: 2000    Years since quitting: 23.1   Smokeless tobacco: Never  Vaping Use   Vaping Use: Never used  Substance and Sexual Activity   Alcohol use: No    Alcohol/week: 0.0 standard drinks   Drug use: No   Sexual activity: Not on file  Other Topics Concern   Not on file  Social History Narrative   Not on file   Social Determinants of Health   Financial Resource Strain: Low Risk    Difficulty of Paying Living Expenses: Not hard at all  Food Insecurity: No Food Insecurity   Worried About Charity fundraiser in the Last Year: Never true   Arboriculturist in the Last Year: Never true  Transportation Needs: No Transportation Needs    Lack of Transportation (Medical): No   Lack of Transportation (Non-Medical): No  Physical Activity: Sufficiently Active   Days of Exercise per Week: 6 days   Minutes of Exercise per Session: 50 min  Stress: No Stress Concern Present   Feeling of Stress : Only a little  Social Connections: Engineer, building services of Communication with Friends and Family: More than three times a week   Frequency of Social Gatherings with Friends and Family: More than three times a week   Attends Religious Services: 1 to 4 times per year   Active Member of [employer] or Organizations: Yes   Attends Music therapist: More than 4 times per year   Marital Status: Married  Human resources officer Violence: Not on file    Outpatient Medications Prior to Visit  Medication Sig Dispense Refill   acetaminophen (TYLENOL) 500 MG tablet Take 500 mg by mouth every 6 (six) hours as needed for moderate pain.     ALPRAZolam (XANAX) 0.25 MG tablet Take 0.25 mg by mouth daily.     aspirin 81 MG tablet Take 81 mg by mouth daily.     atorvastatin (LIPITOR) 80 MG tablet TAKE 1 TABLET BY MOUTH EVERY DAY 90 tablet 1   Azelastine HCl 137 MCG/SPRAY SOLN PLACE 1 SPRAY INTO BOTH NOSTRILS 2 TIMES DAILY AS DIRECTED 30 mL 1   carvedilol (COREG) 12.5 MG tablet TAKE 1 TABLET TWICE A DAY WITH A MEAL 180 tablet 0   celecoxib (CELEBREX) 200 MG capsule Take 200 mg by mouth daily.      clonazePAM (KLONOPIN) 0.5 MG tablet Take 0.5 mg by mouth at bedtime.      diclofenac Sodium (VOLTAREN) 1 % GEL diclofenac 1 % topical gel     escitalopram (LEXAPRO) 20 MG tablet Take 20 mg by mouth daily.     ezetimibe (ZETIA) 10 MG tablet TAKE 1 TABLET BY MOUTH EVERY DAY 90 tablet 1   gabapentin (NEURONTIN) 600 MG tablet TAKE 1 TABLET BY MOUTH TWICE A DAY 180 tablet 1   LOTEMAX 0.5 % GEL SMARTSIG:1 Drop(s) In Eye(s) Twice Daily PRN     pantoprazole (PROTONIX) 40 MG tablet TAKE 1 TABLET BY MOUTH TWICE A DAY 180 tablet 3    promethazine-dextromethorphan (PROMETHAZINE-DM) 6.25-15 MG/5ML syrup Take 5 mLs by mouth 4 (four) times daily as needed for cough. 240 mL 0   RESTASIS 0.05 % ophthalmic emulsion  Place 1 drop into both eyes 2 (two) times daily.      sacubitril-valsartan (ENTRESTO) 49-51 MG Take 1 tablet by mouth 2 (two) times daily. 180 tablet 3   tacrolimus (PROTOPIC) 0.1 % ointment APPLY TO AFFECTED AREA ONCE A DAY     tamsulosin (FLOMAX) 0.4 MG CAPS capsule TAKE 1 CAPSULE (0.4 MG TOTAL) BY MOUTH DAILY AFTER SUPPER. 90 capsule 1   zolpidem (AMBIEN) 10 MG tablet Take 10 mg by mouth at bedtime as needed for sleep.     triamcinolone cream (KENALOG) 0.1 % Apply topically 2 (two) times daily.     benzonatate (TESSALON PERLES) 100 MG capsule 1-2 capsules up to twice daily as needed for cough 30 capsule 0   Spacer/Aero-Holding Chambers DEVI 1 spacer 1 each 0   No facility-administered medications prior to visit.    Allergies  Allergen Reactions   Latex Other (See Comments)    Other   Tape     Use Paper Tape    Sulfa Antibiotics Rash    ROS Review of Systems  Constitutional:  Negative for fatigue.  Respiratory:  Negative for cough, chest tightness and shortness of breath.   Cardiovascular:  Negative for chest pain, palpitations and leg swelling.  Endocrine: Negative for polydipsia and polyuria.  Neurological:  Negative for dizziness, syncope, weakness, light-headedness and headaches.     Objective:    Physical Exam Vitals reviewed.  Constitutional:      Appearance: Normal appearance.  Cardiovascular:     Rate and Rhythm: Normal rate and regular rhythm.  Pulmonary:     Effort: Pulmonary effort is normal.     Breath sounds: Normal breath sounds.  Skin:    Comments: He has some skin atrophy perianal region which is chronic.  No skin lesions noted.  No carbuncles or furuncles or any evidence for perianal abscess.  Neurological:     Mental Status: He is alert.    BP (!) 158/80 (BP Location: Right  Arm, Cuff Size: Normal)    Pulse 70    Temp 98.2 F (36.8 C) (Oral)    Ht 6\' 2"  (1.88 m)    Wt 210 lb (95.3 kg)    SpO2 97%    BMI 26.96 kg/m  Wt Readings from Last 3 Encounters:  05/06/21 210 lb (95.3 kg)  04/30/21 210 lb 11.2 oz (95.6 kg)  04/08/21 213 lb 11.2 oz (96.9 kg)     Health Maintenance Due  Topic Date Due   FOOT EXAM  03/30/2021    There are no preventive care reminders to display for this patient.  Lab Results  Component Value Date   TSH 1.62 03/30/2020   Lab Results  Component Value Date   WBC 5.6 04/30/2021   HGB 12.9 (L) 04/30/2021   HCT 38.4 (L) 04/30/2021   MCV 92.0 04/30/2021   PLT 153.0 04/30/2021   Lab Results  Component Value Date   NA 140 04/30/2021   K 4.2 04/30/2021   CO2 31 04/30/2021   GLUCOSE 133 (H) 04/30/2021   BUN 13 04/30/2021   CREATININE 0.95 04/30/2021   BILITOT 0.5 04/30/2021   ALKPHOS 49 04/30/2021   AST 16 04/30/2021   ALT 16 04/30/2021   PROT 6.6 04/30/2021   ALBUMIN 4.4 04/30/2021   CALCIUM 9.3 04/30/2021   ANIONGAP 7 04/03/2014   GFR 83.72 04/30/2021   Lab Results  Component Value Date   CHOL 152 04/30/2021   Lab Results  Component Value Date   HDL  57.40 04/30/2021   Lab Results  Component Value Date   LDLCALC 75 04/30/2021   Lab Results  Component Value Date   TRIG 100.0 04/30/2021   Lab Results  Component Value Date   CHOLHDL 3 04/30/2021   Lab Results  Component Value Date   HGBA1C 6.6 (H) 04/30/2021      Assessment & Plan:   #1 patient concern regarding possible boil on the buttock.  No evidence for skin lesion noted at this time.  Observe for now  #2 type 2 diabetes.  Recent increase in A1c from 6.2 to 6.6%.  Patient preferring 37-month trial of lifestyle modification and work with nutritionist and then 77-month follow-up.  Consider Jardiance at follow-up if A1c increasing  #3 hypertension.  Elevated reading here today.  He has had some elevation since stopping lisinopril and starting Entresto.   Continue to follow-up with cardiology  No orders of the defined types were placed in this encounter.   Follow-up: No follow-ups on file.    Carolann Littler, MD

## 2021-05-06 NOTE — Patient Instructions (Addendum)
Follow up with Dr Stanford Breed regarding BP    Would consider Jardiance if blood sugars not coming down at follow up.

## 2021-05-07 ENCOUNTER — Emergency Department (HOSPITAL_COMMUNITY): Payer: Medicare Other

## 2021-05-07 ENCOUNTER — Other Ambulatory Visit: Payer: Self-pay

## 2021-05-07 ENCOUNTER — Emergency Department (HOSPITAL_COMMUNITY)
Admission: EM | Admit: 2021-05-07 | Discharge: 2021-05-07 | Disposition: A | Discharge status: Home / Self Care | Payer: Medicare Other | Attending: Emergency Medicine | Admitting: Emergency Medicine

## 2021-05-07 ENCOUNTER — Encounter (HOSPITAL_COMMUNITY): Payer: Self-pay

## 2021-05-07 ENCOUNTER — Other Ambulatory Visit: Payer: Self-pay | Admitting: Family Medicine

## 2021-05-07 ENCOUNTER — Ambulatory Visit: Payer: Medicare Other | Admitting: Family Medicine

## 2021-05-07 DIAGNOSIS — K2970 Gastritis, unspecified, without bleeding: Secondary | ICD-10-CM

## 2021-05-07 DIAGNOSIS — Z7982 Long term (current) use of aspirin: Secondary | ICD-10-CM | POA: Diagnosis not present

## 2021-05-07 DIAGNOSIS — Z20822 Contact with and (suspected) exposure to covid-19: Secondary | ICD-10-CM | POA: Insufficient documentation

## 2021-05-07 DIAGNOSIS — Z91040 Latex allergy status: Secondary | ICD-10-CM | POA: Diagnosis not present

## 2021-05-07 DIAGNOSIS — R112 Nausea with vomiting, unspecified: Secondary | ICD-10-CM

## 2021-05-07 DIAGNOSIS — R1084 Generalized abdominal pain: Secondary | ICD-10-CM | POA: Diagnosis present

## 2021-05-07 DIAGNOSIS — A084 Viral intestinal infection, unspecified: Secondary | ICD-10-CM | POA: Diagnosis not present

## 2021-05-07 LAB — CBC WITH DIFFERENTIAL/PLATELET
Abs Immature Granulocytes: 0.18 10*3/uL — ABNORMAL HIGH (ref 0.00–0.07)
Basophils Absolute: 0 10*3/uL (ref 0.0–0.1)
Basophils Relative: 0 %
Eosinophils Absolute: 0 10*3/uL (ref 0.0–0.5)
Eosinophils Relative: 0 %
HCT: 45.6 % (ref 39.0–52.0)
Hemoglobin: 15.2 g/dL (ref 13.0–17.0)
Immature Granulocytes: 2 %
Lymphocytes Relative: 4 %
Lymphs Abs: 0.4 10*3/uL — ABNORMAL LOW (ref 0.7–4.0)
MCH: 31.3 pg (ref 26.0–34.0)
MCHC: 33.3 g/dL (ref 30.0–36.0)
MCV: 94 fL (ref 80.0–100.0)
Monocytes Absolute: 0.5 10*3/uL (ref 0.1–1.0)
Monocytes Relative: 6 %
Neutro Abs: 8.7 10*3/uL — ABNORMAL HIGH (ref 1.7–7.7)
Neutrophils Relative %: 88 %
Platelets: 146 10*3/uL — ABNORMAL LOW (ref 150–400)
RBC: 4.85 MIL/uL (ref 4.22–5.81)
RDW: 13 % (ref 11.5–15.5)
WBC: 9.9 10*3/uL (ref 4.0–10.5)
nRBC: 0 % (ref 0.0–0.2)

## 2021-05-07 LAB — TROPONIN I (HIGH SENSITIVITY): Troponin I (High Sensitivity): 8 ng/L (ref <18)

## 2021-05-07 LAB — RESP PANEL BY RT-PCR (FLU A&B, COVID) ARPGX2
Influenza A by PCR: NEGATIVE
Influenza B by PCR: NEGATIVE
SARS Coronavirus 2 by RT PCR: NEGATIVE

## 2021-05-07 LAB — COMPREHENSIVE METABOLIC PANEL
ALT: 24 U/L (ref 0–44)
AST: 21 U/L (ref 15–41)
Albumin: 4.4 g/dL (ref 3.5–5.0)
Alkaline Phosphatase: 48 U/L (ref 38–126)
Anion gap: 8 (ref 5–15)
BUN: 15 mg/dL (ref 8–23)
CO2: 26 mmol/L (ref 22–32)
Calcium: 9.1 mg/dL (ref 8.9–10.3)
Chloride: 101 mmol/L (ref 98–111)
Creatinine, Ser: 0.84 mg/dL (ref 0.61–1.24)
GFR, Estimated: >60 mL/min (ref >60)
Glucose, Bld: 168 mg/dL — ABNORMAL HIGH (ref 70–99)
Potassium: 4.5 mmol/L (ref 3.5–5.1)
Sodium: 135 mmol/L (ref 135–145)
Total Bilirubin: 1 mg/dL (ref 0.3–1.2)
Total Protein: 7.3 g/dL (ref 6.5–8.1)

## 2021-05-07 MED ORDER — SODIUM CHLORIDE (PF) 0.9 % IJ SOLN
INTRAMUSCULAR | Status: AC
  Filled 2021-05-07: qty 50

## 2021-05-07 MED ORDER — ONDANSETRON HCL 4 MG PO TABS: 4.0000 mg | ORAL_TABLET | Freq: Four times a day (QID) | ORAL | 0 refills | Status: DC

## 2021-05-07 MED ORDER — ONDANSETRON HCL 4 MG/2ML IJ SOLN
4.0000 mg | Freq: Once | INTRAMUSCULAR | Status: AC
  Administered 2021-05-07: 4 mg via INTRAVENOUS
  Filled 2021-05-07: qty 2

## 2021-05-07 MED ORDER — LACTATED RINGERS IV BOLUS
500.0000 mL | Freq: Once | INTRAVENOUS | Status: AC
  Administered 2021-05-07: 500 mL via INTRAVENOUS

## 2021-05-07 MED ORDER — SODIUM CHLORIDE 0.9 % IV SOLN
12.5000 mg | Freq: Four times a day (QID) | INTRAVENOUS | Status: DC | PRN
  Administered 2021-05-07: 12.5 mg via INTRAVENOUS
  Filled 2021-05-07: qty 0.5
  Filled 2021-05-07: qty 12.5

## 2021-05-07 MED ORDER — IOHEXOL 300 MG/ML  SOLN
100.0000 mL | Freq: Once | INTRAMUSCULAR | Status: AC | PRN
  Administered 2021-05-07: 100 mL via INTRAVENOUS

## 2021-05-07 MED ORDER — SODIUM CHLORIDE 0.9 % IV SOLN
25.0000 mg | Freq: Once | INTRAVENOUS | Status: AC
  Administered 2021-05-07: 25 mg via INTRAVENOUS
  Filled 2021-05-07: qty 25

## 2021-05-07 MED ORDER — SODIUM CHLORIDE 0.9 % IV BOLUS: 500.0000 mL | Freq: Once | INTRAVENOUS | Status: DC

## 2021-05-07 MED ORDER — PROMETHAZINE HCL 25 MG PO TABS: 25.0000 mg | ORAL_TABLET | Freq: Four times a day (QID) | ORAL | 0 refills | Status: DC | PRN

## 2021-05-07 MED ORDER — PROMETHAZINE HCL 25 MG PO TABS
25.0000 mg | ORAL_TABLET | Freq: Four times a day (QID) | ORAL | 0 refills | Status: DC | PRN
Start: 2021-05-07 — End: 2021-06-07

## 2021-05-07 NOTE — ED Notes (Signed)
Blue tube sent to lab 

## 2021-05-07 NOTE — ED Provider Notes (Signed)
[facility] DEPT Provider Note   CSN: [id number] Arrival date & time: 05/07/21  0455     History  Chief Complaint  Patient presents with   Emesis   Fever    Peter Snyder is a 66 y.o. male.  Patient is a 66 y.o. male with PMH of GERD, BPH, RBBB, ischemic cardiomyopathy, and DM presenting for nausea, vomiting, and fever that started last night. Admits to 7 episodes of non-bloody, non-billious vomiting. Denies diarrhea. Admits to temperature with Tmax of 99.55F. States he was exposed to his grandchildren "who are always sick and get me sick".  The history is provided by the patient. No language interpreter was used.  Emesis Associated symptoms: fever   Associated symptoms: no abdominal pain, no arthralgias, no chills, no cough and no sore throat   Fever Associated symptoms: vomiting   Associated symptoms: no chest pain, no chills, no cough, no dysuria, no ear pain, no rash and no sore throat       Home Medications Prior to Admission medications   Medication Sig Start Date End Date Taking? Authorizing Provider  acetaminophen (TYLENOL) 500 MG tablet Take 500 mg by mouth every 6 (six) hours as needed for moderate pain.   Yes [provider]  ALPRAZolam (XANAX) 0.25 MG tablet Take 0.25 mg by mouth daily as needed for anxiety.   Yes [provider]  aspirin 81 MG tablet Take 81 mg by mouth daily.   Yes [provider]  atorvastatin (LIPITOR) 80 MG tablet TAKE 1 TABLET BY MOUTH EVERY DAY Patient taking differently: Take 80 mg by mouth daily. 01/24/21  Yes Burchette, Alinda Sierras, MD  Azelastine HCl 137 MCG/SPRAY SOLN PLACE 1 SPRAY INTO BOTH NOSTRILS 2 TIMES DAILY AS DIRECTED 03/27/21  Yes Burchette, Alinda Sierras, MD  carvedilol (COREG) 12.5 MG tablet TAKE 1 TABLET TWICE A DAY WITH A MEAL Patient taking differently: Take 12.5 mg by mouth 2 (two) times daily with a meal. 04/22/21  Yes Burchette, Alinda Sierras, MD  celecoxib (CELEBREX) 200 MG capsule  Take 200 mg by mouth daily.    Yes [provider]  clonazePAM (KLONOPIN) 0.5 MG tablet Take 0.5 mg by mouth at bedtime.    Yes [provider]  diclofenac Sodium (VOLTAREN) 1 % GEL Apply 2 g topically daily as needed (for pain).   Yes [provider]  escitalopram (LEXAPRO) 20 MG tablet Take 20 mg by mouth at bedtime.   Yes [provider]  ezetimibe (ZETIA) 10 MG tablet TAKE 1 TABLET BY MOUTH EVERY DAY Patient taking differently: Take 10 mg by mouth daily. 11/05/20  Yes Burchette, Alinda Sierras, MD  gabapentin (NEURONTIN) 600 MG tablet TAKE 1 TABLET BY MOUTH TWICE A DAY Patient taking differently: Take 600 mg by mouth 2 (two) times daily. 04/22/21  Yes Burchette, Alinda Sierras, MD  LOTEMAX 0.5 % GEL SMARTSIG:1 Drop(s) In Eye(s) Twice Daily PRN 12/06/18  Yes [provider]  pantoprazole (PROTONIX) 40 MG tablet TAKE 1 TABLET BY MOUTH TWICE A DAY Patient taking differently: Take 40 mg by mouth 2 (two) times daily. 04/16/20  Yes Burchette, Alinda Sierras, MD  RESTASIS 0.05 % ophthalmic emulsion Place 1 drop into both eyes 2 (two) times daily.  11/20/12  Yes [provider]  sacubitril-valsartan (ENTRESTO) 49-51 MG Take 1 tablet by mouth 2 (two) times daily. 04/30/21  Yes Lelon Perla, MD  tacrolimus (PROTOPIC) 0.1 % ointment Apply 1 application topically daily as needed (for rash). 01/13/19  Yes [provider]  tamsulosin (FLOMAX) 0.4 MG CAPS capsule TAKE 1 CAPSULE (0.4 MG TOTAL) BY MOUTH DAILY AFTER SUPPER. Patient taking differently: Take 0.4 mg by mouth daily after breakfast. 08/17/20  Yes Burchette, Alinda Sierras, MD  zolpidem (AMBIEN) 10 MG tablet Take 10 mg by mouth at bedtime as needed for sleep.   Yes [provider]      Allergies    Latex, Tape, and Sulfa antibiotics    Review of Systems   Review of Systems  Constitutional:  Positive for fever. Negative for chills.  HENT:  Negative for ear pain and sore throat.   Eyes:  Negative for pain  and visual disturbance.  Respiratory:  Negative for cough and shortness of breath.   Cardiovascular:  Negative for chest pain and palpitations.  Gastrointestinal:  Positive for vomiting. Negative for abdominal pain.  Genitourinary:  Negative for dysuria and hematuria.  Musculoskeletal:  Negative for arthralgias and back pain.  Skin:  Negative for color change and rash.  Neurological:  Negative for seizures and syncope.  All other systems reviewed and are negative.  Physical Exam Updated Vital Signs BP (!) 145/78    Pulse (!) 107    Temp 97.6 F (36.4 C) (Oral)    Resp 16    Ht 6\' 2"  (1.88 m)    Wt 95.3 kg    SpO2 98%    BMI 26.96 kg/m  Physical Exam Vitals and nursing note reviewed.  Constitutional:      General: He is not in acute distress.    Appearance: He is well-developed.  HENT:     Head: Normocephalic and atraumatic.  Eyes:     Conjunctiva/sclera: Conjunctivae normal.  Cardiovascular:     Rate and Rhythm: Normal rate and regular rhythm.     Heart sounds: No murmur heard. Pulmonary:     Effort: Pulmonary effort is normal. No respiratory distress.     Breath sounds: Normal breath sounds.  Abdominal:     Palpations: Abdomen is soft.     Tenderness: There is no abdominal tenderness.  Musculoskeletal:        General: No swelling.     Cervical back: Neck supple.  Skin:    General: Skin is warm and dry.     Capillary Refill: Capillary refill takes less than 2 seconds.  Neurological:     Mental Status: He is alert.  Psychiatric:        Mood and Affect: Mood normal.    ED Results / Procedures / Treatments   Labs (all labs ordered are listed, but only abnormal results are displayed) Labs Reviewed  CBC WITH DIFFERENTIAL/PLATELET - Abnormal; Notable for the following components:      Result Value   Platelets 146 (*)    Neutro Abs 8.7 (*)    Lymphs Abs 0.4 (*)    Abs Immature Granulocytes 0.18 (*)    All other components within normal limits  COMPREHENSIVE METABOLIC  PANEL - Abnormal; Notable for the following components:   Glucose, Bld 168 (*)    All other components within normal limits  RESP PANEL BY RT-PCR (FLU A&B, COVID) ARPGX2  TROPONIN I (HIGH SENSITIVITY)    EKG None  Radiology No results found.  Procedures Procedures    Medications Ordered in ED Medications - No data to display  ED Course/ Medical Decision Making/ A&P Clinical Course as of 05/08/21 2542  Tue May 07, 2021  0755 Patient states he continues to feel nauseated and  has had an additional episode of emesis. Will give additional iv fluids and phenergan [DR]  0758 HR 124 Will recheck bp [DR]  9678 CT reviewed and no evidence of acute abnormality noted. Patient given Phenergan.  He continues nausea but has cut down some ice chips and crackers.  Plan additional fluid bolus.  Fluid being given cautiously given patient's history of reduced ejection fraction. He was given 1 dose of Phenergan at 12.5 but has continued to be nauseated.  He has been given additional dose of 25 mg. [DR]  1126 Sinus tachycardia to 128.  He is given a second dose of Phenergan.  He is tolerating fluids at this time.  Plan repeat bolus of 500 of LR and reevaluation [DR]  1309 Patient improved with heart rate down to 110 and tolerating fluids plan discharge [DR]    Clinical Course User Index [DR] Pattricia Boss, MD                           Medical Decision Making Amount and/or Complexity of Data Reviewed Labs: ordered. Radiology: ordered.  Risk Prescription drug management.   6:02 AM 66 y.o. male with PMH of GERD, BPH, RBBB, ischemic cardiomyopathy, CHF, and DM presenting for nausea, vomiting, and fever that started last night. Pt is Aox3, no acute distress, afebrile, with tachycardia and hypertension. Pt states he attempted to take his BP meds last night but vomited shortly after.   Abdomen is soft and nontender.  No leukocytosis. Only 1 sirs criteria at this time. Doubt sepsis. Pt has stable  liver profile and renal function. CT abdomen/pelvis demonstrates no acute process.   Symptoms likely secondary to viral gastritis. Pt visited by grandchildren this week.Marland Kitchen Zofran and IVF given with improvement of tachycardia. Pt able to tolerate PO at this time.   Prescription for zofran sent to pharmacy. Pt recommended for increased hydration and rest.  Patient in no distress and overall condition improved here in the ED. Detailed discussions were had with the patient regarding current findings, and need for close f/u with PCP or on call doctor. The patient has been instructed to return immediately if the symptoms worsen in any way for re-evaluation. Patient verbalized understanding and is in agreement with current care plan. All questions answered prior to discharge.         Final Clinical Impression(s) / ED Diagnoses Final diagnoses:  Viral gastritis    Rx / DC Orders ED Discharge Orders     None         Lianne Cure, DO 93/81/01 [id number]

## 2021-05-07 NOTE — Discharge Instructions (Addendum)
Please continue frequent oral hydration as Discussed with Gatorade mixed half-and-half with water Prescription for Phenergan is given Take your usual home medications Return if you are having worsening symptoms especially difficulty keeping fluids down lightheadedness or generalized weakness

## 2021-05-07 NOTE — ED Triage Notes (Addendum)
Pt reports with fever and vomiting since 11 pm (6 times). Pt states that his grandchildren just left from visiting. Pt reports having a heart condition and feels that his heart rate is elevated.

## 2021-05-07 NOTE — ED Provider Notes (Signed)
[facility] DEPT Provider Note   CSN: [id number] Arrival date & time: 05/07/21  0455     History  Chief Complaint  Patient presents with   Emesis   Fever    Peter Snyder is a 66 y.o. male.  Received sign out from Dr. Shawna Orleans Nausea and vomiting with generalized abdominal discomfort CT pending Received iv fluids and zofran and now tolerating po fluids D/c ready pending negative ct results      Home Medications Prior to Admission medications   Medication Sig Start Date End Date Taking? Authorizing Provider  acetaminophen (TYLENOL) 500 MG tablet Take 500 mg by mouth every 6 (six) hours as needed for moderate pain.   Yes [provider]  ALPRAZolam (XANAX) 0.25 MG tablet Take 0.25 mg by mouth daily as needed for anxiety.   Yes [provider]  aspirin 81 MG tablet Take 81 mg by mouth daily.   Yes [provider]  atorvastatin (LIPITOR) 80 MG tablet TAKE 1 TABLET BY MOUTH EVERY DAY Patient taking differently: Take 80 mg by mouth daily. 01/24/21  Yes Burchette, Alinda Sierras, MD  Azelastine HCl 137 MCG/SPRAY SOLN PLACE 1 SPRAY INTO BOTH NOSTRILS 2 TIMES DAILY AS DIRECTED 03/27/21  Yes Burchette, Alinda Sierras, MD  carvedilol (COREG) 12.5 MG tablet TAKE 1 TABLET TWICE A DAY WITH A MEAL Patient taking differently: Take 12.5 mg by mouth 2 (two) times daily with a meal. 04/22/21  Yes Burchette, Alinda Sierras, MD  celecoxib (CELEBREX) 200 MG capsule Take 200 mg by mouth daily.    Yes [provider]  clonazePAM (KLONOPIN) 0.5 MG tablet Take 0.5 mg by mouth at bedtime.    Yes [provider]  diclofenac Sodium (VOLTAREN) 1 % GEL Apply 2 g topically daily as needed (for pain).   Yes [provider]  escitalopram (LEXAPRO) 20 MG tablet Take 20 mg by mouth at bedtime.   Yes [provider]  ezetimibe (ZETIA) 10 MG tablet TAKE 1 TABLET BY MOUTH EVERY DAY Patient taking differently: Take 10 mg by mouth daily. 11/05/20   Yes Burchette, Alinda Sierras, MD  gabapentin (NEURONTIN) 600 MG tablet TAKE 1 TABLET BY MOUTH TWICE A DAY Patient taking differently: Take 600 mg by mouth 2 (two) times daily. 04/22/21  Yes Burchette, Alinda Sierras, MD  LOTEMAX 0.5 % GEL SMARTSIG:1 Drop(s) In Eye(s) Twice Daily PRN 12/06/18  Yes [provider]  ondansetron (ZOFRAN) 4 MG tablet Take 1 tablet (4 mg total) by mouth every 6 (six) hours. 2/70/35  Yes Campbell Stall P, DO  pantoprazole (PROTONIX) 40 MG tablet TAKE 1 TABLET BY MOUTH TWICE A DAY Patient taking differently: Take 40 mg by mouth 2 (two) times daily. 04/16/20  Yes Burchette, Alinda Sierras, MD  promethazine (PHENERGAN) 25 MG tablet Take 1 tablet (25 mg total) by mouth every 6 (six) hours as needed for nausea or vomiting. 05/07/21  Yes Pattricia Boss, MD  promethazine (PHENERGAN) 25 MG tablet Take 1 tablet (25 mg total) by mouth every 6 (six) hours as needed for nausea or vomiting. 05/07/21  Yes Makyle Eslick, Andee Poles, MD  RESTASIS 0.05 % ophthalmic emulsion Place 1 drop into both eyes 2 (two) times daily.  11/20/12  Yes [provider]  sacubitril-valsartan (ENTRESTO) 49-51 MG Take 1 tablet by mouth 2 (two) times daily. 04/30/21  Yes Lelon Perla, MD  tacrolimus (PROTOPIC) 0.1 % ointment Apply 1 application topically daily as needed (for rash). 01/13/19  Yes [provider]  tamsulosin (FLOMAX) 0.4 MG CAPS capsule TAKE 1 CAPSULE (0.4 MG TOTAL) BY MOUTH DAILY AFTER SUPPER. Patient taking differently: Take 0.4 mg by mouth daily after breakfast. 08/17/20  Yes Burchette, Alinda Sierras, MD  zolpidem (AMBIEN) 10 MG tablet Take 10 mg by mouth at bedtime as needed for sleep.   Yes [provider]      Allergies    Latex, Tape, and Sulfa antibiotics    Review of Systems   Review of Systems  Physical Exam Updated Vital Signs BP 110/70 (BP Location: Right Arm)    Pulse (!) 124    Temp 97.6 F (36.4 C) (Oral)    Resp 16    Ht 1.88 m (6\' 2" )    Wt 95.3 kg    SpO2 98%    BMI 26.96 kg/m   Physical Exam  ED Results / Procedures / Treatments   Labs (all labs ordered are listed, but only abnormal results are displayed) Labs Reviewed  CBC WITH DIFFERENTIAL/PLATELET - Abnormal; Notable for the following components:      Result Value   Platelets 146 (*)    Neutro Abs 8.7 (*)    Lymphs Abs 0.4 (*)    Abs Immature Granulocytes 0.18 (*)    All other components within normal limits  COMPREHENSIVE METABOLIC PANEL - Abnormal; Notable for the following components:   Glucose, Bld 168 (*)    All other components within normal limits  RESP PANEL BY RT-PCR (FLU A&B, COVID) [id number]  TROPONIN I (HIGH SENSITIVITY)    EKG EKG Interpretation  Date/Time:  Tuesday May 07 2021 05:06:46 EST Ventricular Rate:  150 PR Interval:  128 QRS Duration: 91 QT Interval:  308 QTC Calculation: 487 R Axis:   -61 Text Interpretation: Sinus tachycardia Probable left atrial enlargement RSR' in V1 or V2, probably normal variant Inferior infarct, age indeterminate Confirmed by Pattricia Boss [phone] on 05/07/2021 8:00:45 AM  Radiology CT ABDOMEN PELVIS W CONTRAST  Result Date: 05/07/2021 CLINICAL DATA:  Acute nonlocalized abdominal pain with fever, nausea and vomiting. EXAM: CT ABDOMEN AND PELVIS WITH CONTRAST TECHNIQUE: Multidetector CT imaging of the abdomen and pelvis was performed using the standard protocol following bolus administration of intravenous contrast. RADIATION DOSE REDUCTION: This exam was performed according to the departmental dose-optimization program which includes automated exposure control, adjustment of the mA and/or kV according to patient size and/or use of iterative reconstruction technique. CONTRAST:  110mL OMNIPAQUE IOHEXOL 300 MG/ML  SOLN COMPARISON:  None. FINDINGS: Lower chest: Streaky basilar atelectasis but no infiltrates or effusions. No pulmonary lesions. The heart is normal in size. No pericardial effusion. Age advanced coronary artery calcifications. Hepatobiliary: No  hepatic lesions or intrahepatic biliary dilatation. The gallbladder is unremarkable. No common bile duct dilatation. Pancreas: No mass, inflammation or ductal dilatation. Spleen: Normal size.  No focal lesions. Adrenals/Urinary Tract: Adrenal glands are normal. No renal lesions or renal calculi. No hydronephrosis. Prominent perirenal fat. Bladder is unremarkable. Stomach/Bowel: The stomach, duodenum, small bowel and colon are unremarkable. No acute inflammatory process, mass lesions or obstructive findings. The terminal ileum and appendix are normal. Vascular/Lymphatic: Age advanced atherosclerotic calcifications involving the aorta and iliac arteries but no aneurysm or dissection. Proximal renal artery calcifications are noted bilaterally. No mesenteric or retroperitoneal mass or adenopathy. Reproductive: Mild prostate gland enlargement. The seminal vesicles are unremarkable. Other: No pelvic mass or adenopathy. No free pelvic fluid collections. No inguinal mass or adenopathy. No abdominal wall hernia or subcutaneous lesions. Musculoskeletal: No significant bony findings. Advanced  degenerative disc disease and facet disease noted at L5-S1. IMPRESSION: 1. No acute abdominal/pelvic findings, mass lesions or adenopathy. 2. Age advanced atherosclerotic calcifications involving the aorta and iliac arteries and coronary arteries. Aortic Atherosclerosis (ICD10-I70.0). Electronically Signed   By: Marijo Sanes M.D.   On: 05/07/2021 07:59    Procedures Procedures    Medications Ordered in ED Medications  promethazine (PHENERGAN) 12.5 mg in sodium chloride 0.9 % 50 mL IVPB (0 mg Intravenous Stopped 05/07/21 0847)  ondansetron (ZOFRAN) injection 4 mg (4 mg Intravenous Given 05/07/21 0612)  sodium chloride (PF) 0.9 % injection (  Given by Other 05/07/21 0741)  iohexol (OMNIPAQUE) 300 MG/ML solution 100 mL (100 mLs Intravenous Contrast Given 05/07/21 0727)  lactated ringers bolus 500 mL (0 mLs Intravenous Stopped  05/07/21 0900)  lactated ringers bolus 500 mL (0 mLs Intravenous Stopped 05/07/21 1105)  promethazine (PHENERGAN) 25 mg in sodium chloride 0.9 % 50 mL IVPB (0 mg Intravenous Stopped 05/07/21 1043)    ED Course/ Medical Decision Making/ A&P Clinical Course as of 05/07/21 1313  Tue May 07, 2021  0755 Patient states he continues to feel nauseated and has had an additional episode of emesis. Will give additional iv fluids and phenergan [DR]  0758 HR 124 Will recheck bp [DR]  2993 CT reviewed and no evidence of acute abnormality noted. Patient given Phenergan.  He continues nausea but has cut down some ice chips and crackers.  Plan additional fluid bolus.  Fluid being given cautiously given patient's history of reduced ejection fraction. He was given 1 dose of Phenergan at 12.5 but has continued to be nauseated.  He has been given additional dose of 25 mg. [DR]  1126 Sinus tachycardia to 128.  He is given a second dose of Phenergan.  He is tolerating fluids at this time.  Plan repeat bolus of 500 of LR and reevaluation [DR]  1309 Patient improved with heart rate down to 110 and tolerating fluids plan discharge [DR]    Clinical Course User Index [DR] Pattricia Boss, MD                           Medical Decision Making Amount and/or Complexity of Data Reviewed Labs: ordered. Radiology: ordered.  Risk Prescription drug management.   Patient has tolerated p.o. fluids without vomiting for several hours now.  His heart rate has decreased to 110.  He also feels that this is likely due to the fact that he has not taken his beta-blockers.  We have discussed return precautions need for follow-up and he voices understanding.        Final Clinical Impression(s) / ED Diagnoses Final diagnoses:  Viral gastritis  Nausea and vomiting, unspecified vomiting type    Rx / DC Orders ED Discharge Orders          Ordered    ondansetron (ZOFRAN) 4 MG tablet  Every 6 hours        05/07/21 0627     promethazine (PHENERGAN) 25 MG tablet  Every 6 hours PRN        05/07/21 0807    promethazine (PHENERGAN) 25 MG tablet  Every 6 hours PRN        05/07/21 1312              Pattricia Boss, MD 05/07/21 1314

## 2021-05-08 ENCOUNTER — Encounter: Payer: Self-pay | Admitting: Cardiology

## 2021-05-08 ENCOUNTER — Other Ambulatory Visit: Payer: Self-pay | Admitting: *Deleted

## 2021-05-08 ENCOUNTER — Encounter: Payer: Self-pay | Admitting: *Deleted

## 2021-05-08 DIAGNOSIS — I7121 Aneurysm of the ascending aorta, without rupture: Secondary | ICD-10-CM

## 2021-05-08 MED ORDER — SACUBITRIL-VALSARTAN 24-26 MG PO TABS: 1.0000 | ORAL_TABLET | Freq: Two times a day (BID) | ORAL | 3 refills | Status: DC

## 2021-05-13 ENCOUNTER — Encounter: Payer: Self-pay | Admitting: Family Medicine

## 2021-05-17 ENCOUNTER — Other Ambulatory Visit: Payer: Self-pay | Admitting: Family Medicine

## 2021-05-17 DIAGNOSIS — R059 Cough, unspecified: Secondary | ICD-10-CM

## 2021-05-26 ENCOUNTER — Ambulatory Visit
Admission: RE | Admit: 2021-05-26 | Discharge: 2021-05-26 | Disposition: A | Discharge status: Home / Self Care | Payer: Medicare Other | Source: Ambulatory Visit | Attending: Cardiology | Admitting: Cardiology

## 2021-05-26 DIAGNOSIS — I7121 Aneurysm of the ascending aorta, without rupture: Secondary | ICD-10-CM

## 2021-05-26 MED ORDER — GADOBENATE DIMEGLUMINE 529 MG/ML IV SOLN
19.0000 mL | Freq: Once | INTRAVENOUS | Status: AC | PRN
  Administered 2021-05-26: 19 mL via INTRAVENOUS

## 2021-05-27 ENCOUNTER — Encounter: Payer: Self-pay | Admitting: Cardiology

## 2021-05-28 ENCOUNTER — Ambulatory Visit: Payer: Medicare Other

## 2021-05-28 ENCOUNTER — Other Ambulatory Visit: Payer: Self-pay | Admitting: Family Medicine

## 2021-06-03 ENCOUNTER — Ambulatory Visit (INDEPENDENT_AMBULATORY_CARE_PROVIDER_SITE_OTHER): Payer: Medicare Other

## 2021-06-03 VITALS — BP 110/62 | HR 70 | Temp 98.3°F | Ht 74.0 in | Wt 212.4 lb

## 2021-06-03 DIAGNOSIS — Z0000 Encounter for general adult medical examination without abnormal findings: Secondary | ICD-10-CM

## 2021-06-03 NOTE — Patient Instructions (Addendum)
?Mr. Brisendine , ?Thank you for taking time to come for your Medicare Wellness Visit. I appreciate your ongoing commitment to your health goals. Please review the following plan we discussed and let me know if I can assist you in the future.  ? ?These are the goals we discussed: ? Goals   ? ?   Increase physical activity (pt-stated)   ?   Maintain good health ?  ? ?  ?  ?This is a list of the screening recommended for you and due dates:  ?Health Maintenance  ?Topic Date Due  ? Complete foot exam   03/30/2021  ? Eye exam for diabetics  07/15/2021  ? Hemoglobin A1C  10/28/2021  ? Tetanus Vaccine  03/14/2024  ? Colon Cancer Screening  05/07/2025  ? Pneumonia Vaccine  Completed  ? Flu Shot  Completed  ? COVID-19 Vaccine  Completed  ? Hepatitis C Screening: USPSTF Recommendation to screen - Ages 14-79 yo.  Completed  ? Zoster (Shingles) Vaccine  Completed  ? HPV Vaccine  Aged Out  ?  ?Advanced directives: Yes Patient will bring copy ? ?Conditions/risks identified: None ? ?Next appointment: Follow up in one year for your annual wellness visit.  ? ?Preventive Care 25 Years and Older, Male ?Preventive care refers to lifestyle choices and visits with your health care provider that can promote health and wellness. ?What does preventive care include? ?A yearly physical exam. This is also called an annual well check. ?Dental exams once or twice a year. ?Routine eye exams. Ask your health care provider how often you should have your eyes checked. ?Personal lifestyle choices, including: ?Daily care of your teeth and gums. ?Regular physical activity. ?Eating a healthy diet. ?Avoiding tobacco and drug use. ?Limiting alcohol use. ?Practicing safe sex. ?Taking low doses of aspirin every day. ?Taking vitamin and mineral supplements as recommended by your health care provider. ?What happens during an annual well check? ?The services and screenings done by your health care provider during your annual well check will depend on your age,  overall health, lifestyle risk factors, and family history of disease. ?Counseling  ?Your health care provider may ask you questions about your: ?Alcohol use. ?Tobacco use. ?Drug use. ?Emotional well-being. ?Home and relationship well-being. ?Sexual activity. ?Eating habits. ?History of falls. ?Memory and ability to understand (cognition). ?Work and work Statistician. ?Screening  ?You may have the following tests or measurements: ?Height, weight, and BMI. ?Blood pressure. ?Lipid and cholesterol levels. These may be checked every 5 years, or more frequently if you are over 60 years old. ?Skin check. ?Lung cancer screening. You may have this screening every year starting at age 62 if you have a 30-pack-year history of smoking and currently smoke or have quit within the past 15 years. ?Fecal occult blood test (FOBT) of the stool. You may have this test every year starting at age 63. ?Flexible sigmoidoscopy or colonoscopy. You may have a sigmoidoscopy every 5 years or a colonoscopy every 10 years starting at age 37. ?Prostate cancer screening. Recommendations will vary depending on your family history and other risks. ?Hepatitis C blood test. ?Hepatitis B blood test. ?Sexually transmitted disease (STD) testing. ?Diabetes screening. This is done by checking your blood sugar (glucose) after you have not eaten for a while (fasting). You may have this done every 1-3 years. ?Abdominal aortic aneurysm (AAA) screening. You may need this if you are a current or former smoker. ?Osteoporosis. You may be screened starting at age 1 if you are at  high risk. ?Talk with your health care provider about your test results, treatment options, and if necessary, the need for more tests. ?Vaccines  ?Your health care provider may recommend certain vaccines, such as: ?Influenza vaccine. This is recommended every year. ?Tetanus, diphtheria, and acellular pertussis (Tdap, Td) vaccine. You may need a Td booster every 10 years. ?Zoster vaccine.  You may need this after age 29. ?Pneumococcal 13-valent conjugate (PCV13) vaccine. One dose is recommended after age 40. ?Pneumococcal polysaccharide (PPSV23) vaccine. One dose is recommended after age 19. ?Talk to your health care provider about which screenings and vaccines you need and how often you need them. ?This information is not intended to replace advice given to you by your health care provider. Make sure you discuss any questions you have with your health care provider. ?Document Released: 03/30/2015 Document Revised: 11/21/2015 Document Reviewed: 01/02/2015 ?Elsevier Interactive Patient Education ? 2017 [place]. ? ?Fall Prevention in the Home ?Falls can cause injuries. They can happen to people of all ages. There are many things you can do to make your home safe and to help prevent falls. ?What can I do on the outside of my home? ?Regularly fix the edges of walkways and driveways and fix any cracks. ?Remove anything that might make you trip as you walk through a door, such as a raised step or threshold. ?Trim any bushes or trees on the path to your home. ?Use bright outdoor lighting. ?Clear any walking paths of anything that might make someone trip, such as rocks or tools. ?Regularly check to see if handrails are loose or broken. Make sure that both sides of any steps have handrails. ?Any raised decks and porches should have guardrails on the edges. ?Have any leaves, snow, or ice cleared regularly. ?Use sand or salt on walking paths during winter. ?Clean up any spills in your garage right away. This includes oil or grease spills. ?What can I do in the bathroom? ?Use night lights. ?Install grab bars by the toilet and in the tub and shower. Do not use towel bars as grab bars. ?Use non-skid mats or decals in the tub or shower. ?If you need to sit down in the shower, use a plastic, non-slip stool. ?Keep the floor dry. Clean up any water that spills on the floor as soon as it happens. ?Remove soap  buildup in the tub or shower regularly. ?Attach bath mats securely with double-sided non-slip rug tape. ?Do not have throw rugs and other things on the floor that can make you trip. ?What can I do in the bedroom? ?Use night lights. ?Make sure that you have a light by your bed that is easy to reach. ?Do not use any sheets or blankets that are too big for your bed. They should not hang down onto the floor. ?Have a firm chair that has side arms. You can use this for support while you get dressed. ?Do not have throw rugs and other things on the floor that can make you trip. ?What can I do in the kitchen? ?Clean up any spills right away. ?Avoid walking on wet floors. ?Keep items that you use a lot in easy-to-reach places. ?If you need to reach something above you, use a strong step stool that has a grab bar. ?Keep electrical cords out of the way. ?Do not use floor polish or wax that makes floors slippery. If you must use wax, use non-skid floor wax. ?Do not have throw rugs and other things on the floor that  can make you trip. ?What can I do with my stairs? ?Do not leave any items on the stairs. ?Make sure that there are handrails on both sides of the stairs and use them. Fix handrails that are broken or loose. Make sure that handrails are as long as the stairways. ?Check any carpeting to make sure that it is firmly attached to the stairs. Fix any carpet that is loose or worn. ?Avoid having throw rugs at the top or bottom of the stairs. If you do have throw rugs, attach them to the floor with carpet tape. ?Make sure that you have a light switch at the top of the stairs and the bottom of the stairs. If you do not have them, ask someone to add them for you. ?What else can I do to help prevent falls? ?Wear shoes that: ?Do not have high heels. ?Have rubber bottoms. ?Are comfortable and fit you well. ?Are closed at the toe. Do not wear sandals. ?If you use a stepladder: ?Make sure that it is fully opened. Do not climb a closed  stepladder. ?Make sure that both sides of the stepladder are locked into place. ?Ask someone to hold it for you, if possible. ?Clearly mark and make sure that you can see: ?Any grab bars or handrails. ?First

## 2021-06-03 NOTE — Progress Notes (Signed)
? ?Subjective:  ? Peter Snyder is a 66 y.o. male who presents for Medicare Annual/Subsequent preventive examination. ? ?Review of Systems    ? ?   ?Objective:  ?  ?There were no vitals filed for this visit. ?There is no height or weight on file to calculate BMI. ? ?Advanced Directives 05/07/2021 01/06/2017 04/02/2014  ?Does Patient Have a Medical Advance Directive? No No No  ?Would patient like information on creating a medical advance directive? No - Patient declined - No - patient declined information  ? ? ?Current Medications (verified) ?Outpatient Encounter Medications as of 06/03/2021  ?Medication Sig  ? acetaminophen (TYLENOL) 500 MG tablet Take 500 mg by mouth every 6 (six) hours as needed for moderate pain.  ? ALPRAZolam (XANAX) 0.25 MG tablet Take 0.25 mg by mouth daily as needed for anxiety.  ? aspirin 81 MG tablet Take 81 mg by mouth daily.  ? atorvastatin (LIPITOR) 80 MG tablet TAKE 1 TABLET BY MOUTH EVERY DAY (Patient taking differently: Take 80 mg by mouth daily.)  ? Azelastine HCl 137 MCG/SPRAY SOLN PLACE 1 SPRAY INTO BOTH NOSTRILS 2 TIMES DAILY AS DIRECTED  ? benzonatate (TESSALON) 100 MG capsule TAKE 1 TO 2 CAPSULES UP TO TWICE A DAY AS NEEDED COUGH  ? carvedilol (COREG) 12.5 MG tablet TAKE 1 TABLET TWICE A DAY WITH A MEAL (Patient taking differently: Take 12.5 mg by mouth 2 (two) times daily with a meal.)  ? celecoxib (CELEBREX) 200 MG capsule Take 200 mg by mouth daily.   ? clonazePAM (KLONOPIN) 0.5 MG tablet Take 0.5 mg by mouth at bedtime.   ? diclofenac Sodium (VOLTAREN) 1 % GEL Apply 2 g topically daily as needed (for pain).  ? escitalopram (LEXAPRO) 20 MG tablet Take 20 mg by mouth at bedtime.  ? ezetimibe (ZETIA) 10 MG tablet TAKE 1 TABLET BY MOUTH EVERY DAY  ? gabapentin (NEURONTIN) 600 MG tablet TAKE 1 TABLET BY MOUTH TWICE A DAY (Patient taking differently: Take 600 mg by mouth 2 (two) times daily.)  ? LOTEMAX 0.5 % GEL SMARTSIG:1 Drop(s) In Eye(s) Twice Daily PRN  ? ondansetron (ZOFRAN) 4  MG tablet Take 1 tablet (4 mg total) by mouth every 6 (six) hours.  ? pantoprazole (PROTONIX) 40 MG tablet TAKE 1 TABLET BY MOUTH TWICE A DAY  ? promethazine (PHENERGAN) 25 MG tablet Take 1 tablet (25 mg total) by mouth every 6 (six) hours as needed for nausea or vomiting.  ? promethazine (PHENERGAN) 25 MG tablet Take 1 tablet (25 mg total) by mouth every 6 (six) hours as needed for nausea or vomiting.  ? RESTASIS 0.05 % ophthalmic emulsion Place 1 drop into both eyes 2 (two) times daily.   ? sacubitril-valsartan (ENTRESTO) 24-26 MG Take 1 tablet by mouth 2 (two) times daily.  ? sacubitril-valsartan (ENTRESTO) 49-51 MG Take 1 tablet by mouth 2 (two) times daily.  ? tacrolimus (PROTOPIC) 0.1 % ointment Apply 1 application topically daily as needed (for rash).  ? tamsulosin (FLOMAX) 0.4 MG CAPS capsule TAKE 1 CAPSULE (0.4 MG TOTAL) BY MOUTH DAILY AFTER SUPPER. (Patient taking differently: Take 0.4 mg by mouth daily after breakfast.)  ? zolpidem (AMBIEN) 10 MG tablet Take 10 mg by mouth at bedtime as needed for sleep.  ? ?No facility-administered encounter medications on file as of 06/03/2021.  ? ? ?Allergies (verified) ?Latex, Tape, and Sulfa antibiotics  ? ?History: ?Past Medical History:  ?Diagnosis Date  ? Allergy   ? Allergy to sulfa drugs 12/10/2020  ?  Arthritis   ? CAD (coronary artery disease) 6/14  ? Cardiac catheterization 6/27/ 2014 ejection fraction 35-40%, 30% proximal left circumflex, 100% tiny obtuse marginal 1 with collaterals, 50% LAD, 50% D1, 100% RCA with collaterals  ? Chicken pox   ? Chronic back pain   ? Colon polyps   ? Depression   ? Diabetes mellitus ([place])   ? Diet controlled- OFF metformin 02-01-20  ? E coli infection 12/10/2020  ? Folliculitis 2/63/7858  ? GERD (gastroesophageal reflux disease)   ? Hyperlipidemia   ? Hypertension   ? Kidney stone   ? Myocardial infarction [facility])   ? ? year- cath 2014/ june   ? ?Past Surgical History:  ?Procedure Laterality Date  ? COLONOSCOPY    ? CRYO  INTERCOSTAL NERVE BLOCK    ? double sacrum nerve block  ? epidural injections    ? multiple- L5 S1 and cervical neck pain issues as well   ? POLYPECTOMY    ? TONSILLECTOMY  1963  ? TRIGGER FINGER RELEASE  2019  ? ?Family History  ?Problem Relation Age of Onset  ? Melanoma Mother   ?     metastatic  ? Cancer Mother   ?     melanoma  ? Hypertension Mother   ? Arthritis Father   ? Colon polyps Father   ? COPD Father   ? Hyperlipidemia Maternal Grandmother   ? Heart disease Maternal Grandmother   ? Arthritis Paternal Grandmother   ? Clotting disorder Paternal Grandmother   ? Clotting disorder Paternal Grandfather   ? Liver cancer Paternal Grandfather   ? Colon cancer Neg Hx   ? Esophageal cancer Neg Hx   ? Rectal cancer Neg Hx   ? Stomach cancer Neg Hx   ? ?Social History  ? ?Socioeconomic History  ? Marital status: Married  ?  Spouse name: Not on file  ? Number of children: 3  ? Years of education: Not on file  ? Highest education level: Bachelor's degree (e.g., BA, AB, BS)  ?Occupational History  ? Occupation: retired  ?  Comment: Retired  ?Tobacco Use  ? Smoking status: Former  ?  Packs/day: 0.25  ?  Years: 28.00  ?  Pack years: 7.00  ?  Types: Cigarettes  ?  Start date: 38  ?  Quit date: 2000  ?  Years since quitting: 23.2  ? Smokeless tobacco: Never  ?Vaping Use  ? Vaping Use: Never used  ?Substance and Sexual Activity  ? Alcohol use: No  ?  Alcohol/week: 0.0 standard drinks  ? Drug use: No  ? Sexual activity: Not on file  ?Other Topics Concern  ? Not on file  ?Social History Narrative  ? Not on file  ? ?Social Determinants of Health  ? ?Financial Resource Strain: Low Risk   ? Difficulty of Paying Living Expenses: Not hard at all  ?Food Insecurity: No Food Insecurity  ? Worried About Charity fundraiser in the Last Year: Never true  ? Ran Out of Food in the Last Year: Never true  ?Transportation Needs: No Transportation Needs  ? Lack of Transportation (Medical): No  ? Lack of Transportation (Non-Medical): No   ?Physical Activity: Sufficiently Active  ? Days of Exercise per Week: 6 days  ? Minutes of Exercise per Session: 50 min  ?Stress: No Stress Concern Present  ? Feeling of Stress : Only a little  ?Social Connections: Socially Integrated  ? Frequency of Communication with Friends and Family: More  than three times a week  ? Frequency of Social Gatherings with Friends and Family: More than three times a week  ? Attends Religious Services: 1 to 4 times per year  ? Active Member of Clubs or Organizations: Yes  ? Attends Archivist Meetings: More than 4 times per year  ? Marital Status: Married  ? ? ?Clinical Intake: ? ? Nutrition Risk Assessment: ? ?Has the patient had any N/V/D within the last 2 months?  No  ?Does the patient have any non-healing wounds?  No  ?Has the patient had any unintentional weight loss or weight gain?  No  ? ?Diabetes: ? ?Is the patient diabetic?  Yes  Pre diabetic ?If diabetic, was a CBG obtained today?  No  ?Did the patient bring in their glucometer from home?  No  ?.  ? ?Financial Strains and Diabetes Management: ? ?Are you having any financial strains with the device, your supplies or your medication? No .  ?Does the patient want to be seen by Chronic Care Management for management of their diabetes?  No  ?Would the patient like to be referred to a Nutritionist or for Diabetic Management?  No  ? ?Diabetic Exams: ? ?Diabetic Eye Exam: Completed Yes. Overdue for diabetic eye exam. Pt has been advised about the importance in completing this exam. A referral has been placed today. Message sent to referral coordinator for scheduling purposes. Advised pt to expect a call from office referred to regarding appt. ? ?Diabetic Foot Exam: Completed Yes. Pt has been advised about the importance in completing this exam. Pt is scheduled for diabetic foot exam on 03/30/20.   ? ?  ? ?How often do you need to have someone help you when you read instructions, pamphlets, or other written materials from  your doctor or pharmacy?: (P) 1 - Never ? ?Diabetic?  Yes ? ?Activities of Daily Living ?In your present state of health, do you have any difficulty performing the following activities: 05/30/2021  ?Hearing? N  ?Visi

## 2021-06-07 ENCOUNTER — Encounter: Payer: Self-pay | Admitting: Family Medicine

## 2021-06-07 ENCOUNTER — Ambulatory Visit (INDEPENDENT_AMBULATORY_CARE_PROVIDER_SITE_OTHER): Payer: Medicare Other | Admitting: Family Medicine

## 2021-06-07 VITALS — BP 106/70 | HR 74 | Temp 97.5°F | Ht 74.0 in | Wt 213.0 lb

## 2021-06-07 DIAGNOSIS — I2510 Atherosclerotic heart disease of native coronary artery without angina pectoris: Secondary | ICD-10-CM

## 2021-06-07 DIAGNOSIS — M79622 Pain in left upper arm: Secondary | ICD-10-CM

## 2021-06-07 DIAGNOSIS — I2583 Coronary atherosclerosis due to lipid rich plaque: Secondary | ICD-10-CM

## 2021-06-07 MED ORDER — CEPHALEXIN 500 MG PO CAPS: 500.0000 mg | ORAL_CAPSULE | Freq: Four times a day (QID) | ORAL | 0 refills | Status: DC

## 2021-06-07 NOTE — Patient Instructions (Signed)
Consider warm compresses to left axilla several times daily ? ?Follow up for any progressive redness, swelling, or fever.  ?

## 2021-06-07 NOTE — Progress Notes (Signed)
? ?Established Patient Office Visit ? ?Subjective:  ?Patient ID: Peter Snyder, male    DOB: 04-Jan-1956  Age: 66 y.o. MRN: [id number] ? ?CC:  ?Chief Complaint  ?Patient presents with  ? Rash  ?  Patient complains of rash under armpit, x1 day  ? ? ?HPI ?[facility] presents for 1 day history of painful area left axilla.  Wife noticed some redness.  No injury.  No vesicles.  No pustules.  No fevers or chills.  No recent change of deodorants ? ?Past Medical History:  ?Diagnosis Date  ? Allergy   ? Allergy to sulfa drugs 12/10/2020  ? Arthritis   ? CAD (coronary artery disease) 6/14  ? Cardiac catheterization 6/27/ 2014 ejection fraction 35-40%, 30% proximal left circumflex, 100% tiny obtuse marginal 1 with collaterals, 50% LAD, 50% D1, 100% RCA with collaterals  ? Chicken pox   ? Chronic back pain   ? Colon polyps   ? Depression   ? Diabetes mellitus ([place])   ? Diet controlled- OFF metformin 02-01-20  ? E coli infection 12/10/2020  ? Folliculitis 08/28/4313  ? GERD (gastroesophageal reflux disease)   ? Hyperlipidemia   ? Hypertension   ? Kidney stone   ? Myocardial infarction [facility])   ? ? year- cath 2014/ june   ? ? ?Past Surgical History:  ?Procedure Laterality Date  ? COLONOSCOPY    ? CRYO INTERCOSTAL NERVE BLOCK    ? double sacrum nerve block  ? epidural injections    ? multiple- L5 S1 and cervical neck pain issues as well   ? POLYPECTOMY    ? TONSILLECTOMY  1963  ? TRIGGER FINGER RELEASE  2019  ? ? ?Family History  ?Problem Relation Age of Onset  ? Melanoma Mother   ?     metastatic  ? Cancer Mother   ?     melanoma  ? Hypertension Mother   ? Arthritis Father   ? Colon polyps Father   ? COPD Father   ? Hyperlipidemia Maternal Grandmother   ? Heart disease Maternal Grandmother   ? Arthritis Paternal Grandmother   ? Clotting disorder Paternal Grandmother   ? Clotting disorder Paternal Grandfather   ? Liver cancer Paternal Grandfather   ? Colon cancer Neg Hx   ? Esophageal cancer Neg Hx   ? Rectal cancer Neg Hx   ? Stomach  cancer Neg Hx   ? ? ?Social History  ? ?Socioeconomic History  ? Marital status: Married  ?  Spouse name: Not on file  ? Number of children: 3  ? Years of education: Not on file  ? Highest education level: Bachelor's degree (e.g., BA, AB, BS)  ?Occupational History  ? Occupation: retired  ?  Comment: Retired  ?Tobacco Use  ? Smoking status: Former  ?  Packs/day: 0.25  ?  Years: 28.00  ?  Pack years: 7.00  ?  Types: Cigarettes  ?  Start date: 58  ?  Quit date: 2000  ?  Years since quitting: 23.2  ? Smokeless tobacco: Never  ?Vaping Use  ? Vaping Use: Never used  ?Substance and Sexual Activity  ? Alcohol use: No  ?  Alcohol/week: 0.0 standard drinks  ? Drug use: No  ? Sexual activity: Not on file  ?Other Topics Concern  ? Not on file  ?Social History Narrative  ? Not on file  ? ?Social Determinants of Health  ? ?Financial Resource Strain: Low Risk   ? Difficulty of Paying Living  Expenses: Not hard at all  ?Food Insecurity: No Food Insecurity  ? Worried About Charity fundraiser in the Last Year: Never true  ? Ran Out of Food in the Last Year: Never true  ?Transportation Needs: No Transportation Needs  ? Lack of Transportation (Medical): No  ? Lack of Transportation (Non-Medical): No  ?Physical Activity: Sufficiently Active  ? Days of Exercise per Week: 7 days  ? Minutes of Exercise per Session: 50 min  ?Stress: No Stress Concern Present  ? Feeling of Stress : Only a little  ?Social Connections: Moderately Integrated  ? Frequency of Communication with Friends and Family: More than three times a week  ? Frequency of Social Gatherings with Friends and Family: More than three times a week  ? Attends Religious Services: Never  ? Active Member of Clubs or Organizations: Yes  ? Attends Archivist Meetings: More than 4 times per year  ? Marital Status: Married  ?Intimate Partner Violence: Not At Risk  ? Fear of Current or Ex-Partner: No  ? Emotionally Abused: No  ? Physically Abused: No  ? Sexually Abused: No   ? ? ?Outpatient Medications Prior to Visit  ?Medication Sig Dispense Refill  ? acetaminophen (TYLENOL) 500 MG tablet Take 500 mg by mouth every 6 (six) hours as needed for moderate pain.    ? ALPRAZolam (XANAX) 0.25 MG tablet Take 0.25 mg by mouth daily as needed for anxiety.    ? aspirin 81 MG tablet Take 81 mg by mouth daily.    ? atorvastatin (LIPITOR) 80 MG tablet TAKE 1 TABLET BY MOUTH EVERY DAY (Patient taking differently: Take 80 mg by mouth daily.) 90 tablet 1  ? Azelastine HCl 137 MCG/SPRAY SOLN PLACE 1 SPRAY INTO BOTH NOSTRILS 2 TIMES DAILY AS DIRECTED 30 mL 1  ? carvedilol (COREG) 12.5 MG tablet TAKE 1 TABLET TWICE A DAY WITH A MEAL (Patient taking differently: Take 12.5 mg by mouth 2 (two) times daily with a meal.) 180 tablet 0  ? celecoxib (CELEBREX) 200 MG capsule Take 200 mg by mouth daily.     ? clonazePAM (KLONOPIN) 0.5 MG tablet Take 0.5 mg by mouth at bedtime.     ? diclofenac Sodium (VOLTAREN) 1 % GEL Apply 2 g topically daily as needed (for pain).    ? escitalopram (LEXAPRO) 20 MG tablet Take 20 mg by mouth at bedtime.    ? ezetimibe (ZETIA) 10 MG tablet TAKE 1 TABLET BY MOUTH EVERY DAY 90 tablet 1  ? gabapentin (NEURONTIN) 600 MG tablet TAKE 1 TABLET BY MOUTH TWICE A DAY (Patient taking differently: Take 600 mg by mouth 2 (two) times daily.) 180 tablet 1  ? LOTEMAX 0.5 % GEL SMARTSIG:1 Drop(s) In Eye(s) Twice Daily PRN    ? RESTASIS 0.05 % ophthalmic emulsion Place 1 drop into both eyes 2 (two) times daily.     ? sacubitril-valsartan (ENTRESTO) 49-51 MG Take 1 tablet by mouth 2 (two) times daily. 180 tablet 3  ? tacrolimus (PROTOPIC) 0.1 % ointment Apply 1 application topically daily as needed (for rash).    ? tamsulosin (FLOMAX) 0.4 MG CAPS capsule TAKE 1 CAPSULE (0.4 MG TOTAL) BY MOUTH DAILY AFTER SUPPER. (Patient taking differently: Take 0.4 mg by mouth daily after breakfast.) 90 capsule 1  ? zolpidem (AMBIEN) 10 MG tablet Take 10 mg by mouth at bedtime as needed for sleep.    ? benzonatate  (TESSALON) 100 MG capsule TAKE 1 TO 2 CAPSULES UP TO TWICE  A DAY AS NEEDED COUGH 30 capsule 0  ? ondansetron (ZOFRAN) 4 MG tablet Take 1 tablet (4 mg total) by mouth every 6 (six) hours. 12 tablet 0  ? pantoprazole (PROTONIX) 40 MG tablet TAKE 1 TABLET BY MOUTH TWICE A DAY 180 tablet 0  ? promethazine (PHENERGAN) 25 MG tablet Take 1 tablet (25 mg total) by mouth every 6 (six) hours as needed for nausea or vomiting. 30 tablet 0  ? promethazine (PHENERGAN) 25 MG tablet Take 1 tablet (25 mg total) by mouth every 6 (six) hours as needed for nausea or vomiting. 30 tablet 0  ? sacubitril-valsartan (ENTRESTO) 24-26 MG Take 1 tablet by mouth 2 (two) times daily. 60 tablet 3  ? ?No facility-administered medications prior to visit.  ? ? ?Allergies  ?Allergen Reactions  ? Latex Other (See Comments)  ?  Other  ? Tape   ?  Use Paper Tape   ? Sulfa Antibiotics Rash  ? ? ?ROS ?Review of Systems  ?Constitutional:  Negative for chills and fever.  ? ?  ?Objective:  ?  ?Physical Exam ?Constitutional:   ?   Appearance: Normal appearance.  ?Skin: ?   Comments: Left axilla-area of mild erythema about 2 x 2 cm.  Near the center he has minimally swollen subcutaneous cystic-like mass which is only about 1/2 cm diameter.  No fluctuance.  No overlying pustules.  No vesicles.  ?Neurological:  ?   Mental Status: He is alert.  ? ? ?BP 106/70 (BP Location: Left Arm, Patient Position: Sitting, Cuff Size: Normal)   Pulse 74   Temp (!) 97.5 ?F (36.4 ?C) (Oral)   Ht '6\' 2"'$  (1.88 m)   Wt 213 lb (96.6 kg)   SpO2 98%   BMI 27.35 kg/m?  ?Wt Readings from Last 3 Encounters:  ?06/07/21 213 lb (96.6 kg)  ?06/03/21 212 lb 6.4 oz (96.3 kg)  ?05/07/21 210 lb (95.3 kg)  ? ? ? ?Health Maintenance Due  ?Topic Date Due  ? FOOT EXAM  03/30/2021  ? ? ?There are no preventive care reminders to display for this patient. ? ?Lab Results  ?Component Value Date  ? TSH 1.62 03/30/2020  ? ?Lab Results  ?Component Value Date  ? WBC 9.9 05/07/2021  ? HGB 15.2 05/07/2021   ? HCT 45.6 05/07/2021  ? MCV 94.0 05/07/2021  ? PLT 146 (L) 05/07/2021  ? ?Lab Results  ?Component Value Date  ? NA 135 05/07/2021  ? K 4.5 05/07/2021  ? CO2 26 05/07/2021  ? GLUCOSE 168 (H) 05/07/2021  ? BUN

## 2021-06-15 ENCOUNTER — Other Ambulatory Visit: Payer: Self-pay | Admitting: Family Medicine

## 2021-06-17 ENCOUNTER — Ambulatory Visit: Payer: Medicare Other | Admitting: Family Medicine

## 2021-06-17 ENCOUNTER — Telehealth: Payer: Self-pay | Admitting: Family Medicine

## 2021-06-17 NOTE — Telephone Encounter (Signed)
Patient calling in with respiratory symptoms: ?Shortness of breath, chest pain, palpitations or other red words send to Triage ? ?Does the patient have a fever over 100, cough, congestion, sore throat, runny nose, lost of taste/smell (please list symptoms that patient has)? Cold/flu symptoms  ? ?What date did symptoms start? Past couple days  ?(If over 5 days ago, pt may be scheduled for in person visit) ? ?Have you tested for Covid in the last 5 days? No  ? ?If yes, was it positive OR negative ? If positive in the last 5 days, please schedule virtual visit now. If negative, schedule for an in person OV with the next available provider if PCP has no openings. Please also let patient know they will be tested again (follow the script below) ? ?"you will have to arrive 43mns prior to your appt time to be Covid tested. Please park in back of office at the cone & call [phone] let the staff know you have arrived. A staff member will meet you at your car to do a rapid covid test. Once the test has resulted you will be notified by phone of your results to determine if appt will remain an in person visit or be converted to a virtual/phone visit. If you arrive less than 358ms before your appt time, your visit will be automatically converted to virtual & any recommended testing will happen AFTER the visit." ? ? ?THINGS TO REMEMBER ? ?If no availability for virtual visit in office,  please schedule another [place] office ? ?If no availability at another [place], please instruct patient that they can schedule an evisit or virtual visit through their mychart account. Visits up to 8pm ? ?patients can be seen in office 5 days after positive COVID test ? ?  ?

## 2021-06-18 ENCOUNTER — Ambulatory Visit (INDEPENDENT_AMBULATORY_CARE_PROVIDER_SITE_OTHER): Payer: Medicare Other | Admitting: Family Medicine

## 2021-06-18 ENCOUNTER — Ambulatory Visit: Payer: Medicare Other | Admitting: Family Medicine

## 2021-06-18 ENCOUNTER — Encounter: Payer: Self-pay | Admitting: Family Medicine

## 2021-06-18 VITALS — BP 104/60 | HR 92 | Temp 98.1°F | Ht 74.0 in | Wt 211.0 lb

## 2021-06-18 DIAGNOSIS — I2583 Coronary atherosclerosis due to lipid rich plaque: Secondary | ICD-10-CM

## 2021-06-18 DIAGNOSIS — J019 Acute sinusitis, unspecified: Secondary | ICD-10-CM

## 2021-06-18 DIAGNOSIS — I2510 Atherosclerotic heart disease of native coronary artery without angina pectoris: Secondary | ICD-10-CM | POA: Diagnosis not present

## 2021-06-18 DIAGNOSIS — R059 Cough, unspecified: Secondary | ICD-10-CM

## 2021-06-18 LAB — POCT INFLUENZA A/B
Influenza A, POC: NEGATIVE
Influenza B, POC: NEGATIVE

## 2021-06-18 LAB — POC COVID19 BINAXNOW: SARS Coronavirus 2 Ag: NEGATIVE

## 2021-06-18 MED ORDER — BENZONATATE 100 MG PO CAPS: 100.0000 mg | ORAL_CAPSULE | Freq: Three times a day (TID) | ORAL | 0 refills | Status: DC | PRN

## 2021-06-18 MED ORDER — CEFDINIR 300 MG PO CAPS: 300.0000 mg | ORAL_CAPSULE | Freq: Two times a day (BID) | ORAL | 0 refills | Status: DC

## 2021-06-18 MED ORDER — METHYLPREDNISOLONE ACETATE 80 MG/ML IJ SUSP
80.0000 mg | Freq: Once | INTRAMUSCULAR | Status: AC
  Administered 2021-06-18: 80 mg via INTRAMUSCULAR

## 2021-06-18 NOTE — Progress Notes (Signed)
? ?Established Patient Office Visit ? ?Subjective:  ?Patient ID: Peter Snyder, male    DOB: [birth year]  Age: 66 y.o. MRN: [id number] ? ?CC:  ?Chief Complaint  ?Patient presents with  ? Cough  ?  Patient complains of cough  ? ? ?HPI ?[facility] presents for acute sinusitis symptoms.  Onset about 10 days ago.  His wife just got a hospital from viral pneumonia.  She is improved.  Manish has had some low-grade fever up to 100.7 yesterday though afebrile today.  Occasional chills.  He had some mild body aches.  O2 sats have been lower than usual though he denies any dyspnea.  His cough is mostly nonproductive.  He has some thick colored nasal discharge.  Increased facial pressure.  Has been prone to frequent sinusitis in the past.  He has chronic perennial allergies ? ?Past Medical History:  ?Diagnosis Date  ? Allergy   ? Allergy to sulfa drugs 12/10/2020  ? Arthritis   ? CAD (coronary artery disease) 6/14  ? Cardiac catheterization 6/27/ 2014 ejection fraction 35-40%, 30% proximal left circumflex, 100% tiny obtuse marginal 1 with collaterals, 50% LAD, 50% D1, 100% RCA with collaterals  ? Chicken pox   ? Chronic back pain   ? Colon polyps   ? Depression   ? Diabetes mellitus ([place])   ? Diet controlled- OFF metformin 02-01-20  ? E coli infection 12/10/2020  ? Folliculitis 6/38/9373  ? GERD (gastroesophageal reflux disease)   ? Hyperlipidemia   ? Hypertension   ? Kidney stone   ? Myocardial infarction [facility])   ? ? year- cath 2014/ june   ? ? ?Past Surgical History:  ?Procedure Laterality Date  ? COLONOSCOPY    ? CRYO INTERCOSTAL NERVE BLOCK    ? double sacrum nerve block  ? epidural injections    ? multiple- L5 S1 and cervical neck pain issues as well   ? POLYPECTOMY    ? TONSILLECTOMY  1963  ? TRIGGER FINGER RELEASE  2019  ? ? ?Family History  ?Problem Relation Age of Onset  ? Melanoma Mother   ?     metastatic  ? Cancer Mother   ?     melanoma  ? Hypertension Mother   ? Arthritis Father   ? Colon polyps Father   ? COPD  Father   ? Hyperlipidemia Maternal Grandmother   ? Heart disease Maternal Grandmother   ? Arthritis Paternal Grandmother   ? Clotting disorder Paternal Grandmother   ? Clotting disorder Paternal Grandfather   ? Liver cancer Paternal Grandfather   ? Colon cancer Neg Hx   ? Esophageal cancer Neg Hx   ? Rectal cancer Neg Hx   ? Stomach cancer Neg Hx   ? ? ?Social History  ? ?Socioeconomic History  ? Marital status: Married  ?  Spouse name: Not on file  ? Number of children: 3  ? Years of education: Not on file  ? Highest education level: Bachelor's degree (e.g., BA, AB, BS)  ?Occupational History  ? Occupation: retired  ?  Comment: Retired  ?Tobacco Use  ? Smoking status: Former  ?  Packs/day: 0.25  ?  Years: 28.00  ?  Pack years: 7.00  ?  Types: Cigarettes  ?  Start date: 64  ?  Quit date: 2000  ?  Years since quitting: 23.2  ? Smokeless tobacco: Never  ?Vaping Use  ? Vaping Use: Never used  ?Substance and Sexual Activity  ? Alcohol use: No  ?  Alcohol/week: 0.0 standard drinks  ? Drug use: No  ? Sexual activity: Not on file  ?Other Topics Concern  ? Not on file  ?Social History Narrative  ? Not on file  ? ?Social Determinants of Health  ? ?Financial Resource Strain: Low Risk   ? Difficulty of Paying Living Expenses: Not hard at all  ?Food Insecurity: No Food Insecurity  ? Worried About Charity fundraiser in the Last Year: Never true  ? Ran Out of Food in the Last Year: Never true  ?Transportation Needs: No Transportation Needs  ? Lack of Transportation (Medical): No  ? Lack of Transportation (Non-Medical): No  ?Physical Activity: Sufficiently Active  ? Days of Exercise per Week: 7 days  ? Minutes of Exercise per Session: 50 min  ?Stress: No Stress Concern Present  ? Feeling of Stress : Only a little  ?Social Connections: Moderately Integrated  ? Frequency of Communication with Friends and Family: More than three times a week  ? Frequency of Social Gatherings with Friends and Family: More than three times a week  ?  Attends Religious Services: Never  ? Active Member of Clubs or Organizations: Yes  ? Attends Archivist Meetings: More than 4 times per year  ? Marital Status: Married  ?Intimate Partner Violence: Not At Risk  ? Fear of Current or Ex-Partner: No  ? Emotionally Abused: No  ? Physically Abused: No  ? Sexually Abused: No  ? ? ?Outpatient Medications Prior to Visit  ?Medication Sig Dispense Refill  ? acetaminophen (TYLENOL) 500 MG tablet Take 500 mg by mouth every 6 (six) hours as needed for moderate pain.    ? ALPRAZolam (XANAX) 0.25 MG tablet Take 0.25 mg by mouth daily as needed for anxiety.    ? aspirin 81 MG tablet Take 81 mg by mouth daily.    ? atorvastatin (LIPITOR) 80 MG tablet TAKE 1 TABLET BY MOUTH EVERY DAY (Patient taking differently: Take 80 mg by mouth daily.) 90 tablet 1  ? Azelastine HCl 137 MCG/SPRAY SOLN PLACE 1 SPRAY INTO BOTH NOSTRILS 2 TIMES DAILY AS DIRECTED 30 mL 1  ? carvedilol (COREG) 12.5 MG tablet TAKE 1 TABLET TWICE A DAY WITH A MEAL (Patient taking differently: Take 12.5 mg by mouth 2 (two) times daily with a meal.) 180 tablet 0  ? celecoxib (CELEBREX) 200 MG capsule Take 200 mg by mouth daily.     ? cephALEXin (KEFLEX) 500 MG capsule Take 1 capsule (500 mg total) by mouth 4 (four) times daily. 28 capsule 0  ? clonazePAM (KLONOPIN) 0.5 MG tablet Take 0.5 mg by mouth at bedtime.     ? diclofenac Sodium (VOLTAREN) 1 % GEL Apply 2 g topically daily as needed (for pain).    ? escitalopram (LEXAPRO) 20 MG tablet Take 20 mg by mouth at bedtime.    ? ezetimibe (ZETIA) 10 MG tablet TAKE 1 TABLET BY MOUTH EVERY DAY 90 tablet 1  ? gabapentin (NEURONTIN) 600 MG tablet TAKE 1 TABLET BY MOUTH TWICE A DAY (Patient taking differently: Take 600 mg by mouth 2 (two) times daily.) 180 tablet 1  ? LOTEMAX 0.5 % GEL SMARTSIG:1 Drop(s) In Eye(s) Twice Daily PRN    ? RESTASIS 0.05 % ophthalmic emulsion Place 1 drop into both eyes 2 (two) times daily.     ? sacubitril-valsartan (ENTRESTO) 49-51 MG Take 1  tablet by mouth 2 (two) times daily. 180 tablet 3  ? tacrolimus (PROTOPIC) 0.1 % ointment Apply 1 application  topically daily as needed (for rash).    ? tamsulosin (FLOMAX) 0.4 MG CAPS capsule TAKE 1 CAPSULE EVERY DAY AFTER SUPPER 90 capsule 0  ? zolpidem (AMBIEN) 10 MG tablet Take 10 mg by mouth at bedtime as needed for sleep.    ? ?No facility-administered medications prior to visit.  ? ? ?Allergies  ?Allergen Reactions  ? Latex Other (See Comments)  ?  Other  ? Tape   ?  Use Paper Tape   ? Sulfa Antibiotics Rash  ? ? ?ROS ?Review of Systems  ?Constitutional:  Positive for chills and fatigue. Negative for fever.  ?HENT:  Positive for congestion, sinus pressure and sinus pain. Negative for ear pain.   ?Respiratory:  Positive for cough. Negative for wheezing.   ? ?  ?Objective:  ?  ?Physical Exam ?Vitals reviewed.  ?Constitutional:   ?   Appearance: Normal appearance. He is not ill-appearing or toxic-appearing.  ?HENT:  ?   Right Ear: Tympanic membrane normal.  ?   Left Ear: Tympanic membrane normal.  ?   Nose:  ?   Comments: Nasal mucosa is erythematous.  Thick yellow mucus bilaterally. ?Cardiovascular:  ?   Rate and Rhythm: Regular rhythm.  ?Pulmonary:  ?   Effort: Pulmonary effort is normal.  ?   Breath sounds: Normal breath sounds. No wheezing or rales.  ?Musculoskeletal:  ?   Cervical back: Neck supple.  ?Lymphadenopathy:  ?   Cervical: No cervical adenopathy.  ?Neurological:  ?   Mental Status: He is alert.  ? ? ?BP 104/60 (BP Location: Left Arm, Patient Position: Sitting, Cuff Size: Normal)   Pulse 92   Temp 98.1 ?F (36.7 ?C) (Oral)   Ht '6\' 2"'$  (1.88 m)   Wt 211 lb (95.7 kg)   SpO2 93%   BMI 27.09 kg/m?  ?Wt Readings from Last 3 Encounters:  ?06/18/21 211 lb (95.7 kg)  ?06/07/21 213 lb (96.6 kg)  ?06/03/21 212 lb 6.4 oz (96.3 kg)  ? ? ? ?Health Maintenance Due  ?Topic Date Due  ? FOOT EXAM  03/30/2021  ? ? ?There are no preventive care reminders to display for this patient. ? ?Lab Results  ?Component  Value Date  ? TSH 1.62 03/30/2020  ? ?Lab Results  ?Component Value Date  ? WBC 9.9 05/07/2021  ? HGB 15.2 05/07/2021  ? HCT 45.6 05/07/2021  ? MCV 94.0 05/07/2021  ? PLT 146 (L) 05/07/2021  ? ?Lab Results  ?Comp

## 2021-06-19 ENCOUNTER — Encounter: Payer: Self-pay | Admitting: Family Medicine

## 2021-06-24 ENCOUNTER — Encounter: Payer: Self-pay | Admitting: Family Medicine

## 2021-06-24 ENCOUNTER — Ambulatory Visit (INDEPENDENT_AMBULATORY_CARE_PROVIDER_SITE_OTHER): Payer: Medicare Other | Admitting: Family Medicine

## 2021-06-24 VITALS — BP 96/60 | HR 75 | Temp 97.8°F | Ht 74.0 in | Wt 212.0 lb

## 2021-06-24 DIAGNOSIS — I2583 Coronary atherosclerosis due to lipid rich plaque: Secondary | ICD-10-CM | POA: Diagnosis not present

## 2021-06-24 DIAGNOSIS — I2510 Atherosclerotic heart disease of native coronary artery without angina pectoris: Secondary | ICD-10-CM

## 2021-06-24 DIAGNOSIS — J069 Acute upper respiratory infection, unspecified: Secondary | ICD-10-CM

## 2021-06-24 MED ORDER — METHYLPREDNISOLONE ACETATE 80 MG/ML IJ SUSP
120.0000 mg | Freq: Once | INTRAMUSCULAR | Status: AC
  Administered 2021-06-24: 120 mg via INTRAMUSCULAR

## 2021-06-24 MED ORDER — BENZONATATE 100 MG PO CAPS: 100.0000 mg | ORAL_CAPSULE | Freq: Four times a day (QID) | ORAL | 0 refills | Status: DC | PRN

## 2021-06-24 NOTE — Progress Notes (Signed)
? ?  Subjective:  ? ? Patient ID: Peter Snyder, male    DOB: [birth year], 66 y.o.   MRN: [id number] ? ?HPI ?Here for continued coughing. He was seen here on 06-18-21 for sinus congestion, PND, and coughing up clear sputum. He was given Cefdinir and Benzonatate. Since then the sinus congestion has eased up but the cough continues. He had a fever to first few days but not now. No chest pain or SOB. He also notes that he had some mouth ulcers when this started, but these have resolved. No ST.  ? ? ?Review of Systems  ?Constitutional: Negative.   ?HENT: Negative.    ?Eyes: Negative.   ?Respiratory:  Positive for cough. Negative for chest tightness, shortness of breath and wheezing.   ?Gastrointestinal: Negative.   ? ?   ?Objective:  ? Physical Exam ?Constitutional:   ?   Appearance: Normal appearance. He is not ill-appearing.  ?HENT:  ?   Right Ear: Tympanic membrane, ear canal and external ear normal.  ?   Left Ear: Tympanic membrane, ear canal and external ear normal.  ?   Nose: Nose normal.  ?   Mouth/Throat:  ?   Pharynx: Oropharynx is clear.  ?Eyes:  ?   Conjunctiva/sclera: Conjunctivae normal.  ?Pulmonary:  ?   Effort: Pulmonary effort is normal.  ?   Breath sounds: Normal breath sounds.  ?Lymphadenopathy:  ?   Cervical: No cervical adenopathy.  ?Neurological:  ?   Mental Status: He is alert.  ? ? ? ? ? ?   ?Assessment & Plan:  ?Viral URI. He will finish out the Cefdinir. He is given a shot of DepoMedrol. This should resolve in the next few days.  ?Alysia Penna, MD ? ? ?

## 2021-06-28 ENCOUNTER — Encounter: Payer: Self-pay | Admitting: Cardiology

## 2021-07-02 ENCOUNTER — Ambulatory Visit: Payer: Medicare Other | Admitting: Adult Health

## 2021-07-17 ENCOUNTER — Other Ambulatory Visit: Payer: Self-pay | Admitting: Family Medicine

## 2021-08-08 ENCOUNTER — Encounter: Payer: Self-pay | Admitting: Cardiology

## 2021-08-11 ENCOUNTER — Other Ambulatory Visit: Payer: Self-pay | Admitting: Family Medicine

## 2021-08-13 LAB — HM DIABETES EYE EXAM

## 2021-08-23 ENCOUNTER — Encounter: Payer: Self-pay | Admitting: Family Medicine

## 2021-08-26 ENCOUNTER — Encounter: Payer: Self-pay | Admitting: Family Medicine

## 2021-09-04 ENCOUNTER — Other Ambulatory Visit: Payer: Self-pay | Admitting: Family Medicine

## 2021-09-12 ENCOUNTER — Other Ambulatory Visit: Payer: Self-pay | Admitting: Family Medicine

## 2021-09-16 ENCOUNTER — Other Ambulatory Visit: Payer: Self-pay | Admitting: Family Medicine

## 2021-10-02 ENCOUNTER — Encounter: Payer: Self-pay | Admitting: Cardiology

## 2021-10-14 NOTE — Progress Notes (Signed)
HPI:FU coronary artery disease. Previous cardiac care in [place]. Cardiac catheterization performed in June of 2014 showed an ejection fraction of 35-40%. The inferior wall is akinetic. The left main was normal. There was a 30% proximal circumflex, occluded small first obtuse marginal with collaterals, a 50% ramus intermedius, 50% LAD, 50% first diagonal and an occluded right coronary artery with collaterals. Treated medically. Nuclear study January 2017 showed ejection fraction 35%. There was a prior inferior infarct with mild peri-infarct ischemia. Cardiac MRI May 2017 showed akinetic inferior wall with ejection fraction 40%. Biatrial enlargement and mild aortic/mitral regurgitation. Echocardiogram July 2020 showed ejection fraction 55% (I reviewed and felt EF 40-45), moderate left ventricular hypertrophy, grade 2 diastolic dysfunction, mild left atrial enlargement, mild to moderate mitral regurgitation and mildly dilated ascending aorta. Abdominal ultrasound December 2020 showed no aneurysm.  MRA March 2023 showed 4.2 cm ascending aortic aneurysm.  Since last seen, there is no dyspnea, chest pain, palpitations or syncope.  Current Outpatient Medications  Medication Sig Dispense Refill   acetaminophen (TYLENOL) 500 MG tablet Take 500 mg by mouth every 6 (six) hours as needed for moderate pain.     ALPRAZolam (XANAX) 0.25 MG tablet Take 0.25 mg by mouth daily as needed for anxiety.     aspirin 81 MG tablet Take 81 mg by mouth daily.     atorvastatin (LIPITOR) 80 MG tablet TAKE 1 TABLET BY MOUTH EVERY DAY 90 tablet 1   Azelastine HCl 137 MCG/SPRAY SOLN PLACE 1 SPRAY INTO BOTH NOSTRILS 2 TIMES DAILY AS DIRECTED 30 mL 1   carvedilol (COREG) 12.5 MG tablet TAKE 1 TABLET TWICE A DAY WITH A MEAL 180 tablet 0   celecoxib (CELEBREX) 200 MG capsule Take 200 mg by mouth daily.      clonazePAM (KLONOPIN) 0.5 MG tablet Take 0.5 mg by mouth at bedtime.      diclofenac Sodium (VOLTAREN) 1 % GEL Apply 2 g  topically daily as needed (for pain).     escitalopram (LEXAPRO) 20 MG tablet Take 20 mg by mouth at bedtime.     ezetimibe (ZETIA) 10 MG tablet TAKE 1 TABLET BY MOUTH EVERY DAY 90 tablet 1   gabapentin (NEURONTIN) 600 MG tablet TAKE 1 TABLET BY MOUTH TWICE A DAY 180 tablet 1   LOTEMAX 0.5 % GEL SMARTSIG:1 Drop(s) In Eye(s) Twice Daily PRN     pantoprazole (PROTONIX) 40 MG tablet TAKE 1 TABLET BY MOUTH TWICE A DAY 180 tablet 0   RESTASIS 0.05 % ophthalmic emulsion Place 1 drop into both eyes 2 (two) times daily.      sacubitril-valsartan (ENTRESTO) 97-103 MG Take 1 tablet by mouth 2 (two) times daily. 60 tablet 3   tacrolimus (PROTOPIC) 0.1 % ointment Apply 1 application topically daily as needed (for rash).     tamsulosin (FLOMAX) 0.4 MG CAPS capsule TAKE 1 CAPSULE EVERY DAY AFTER SUPPER 90 capsule 0   zolpidem (AMBIEN) 10 MG tablet Take 10 mg by mouth at bedtime as needed for sleep.     No current facility-administered medications for this visit.     Past Medical History:  Diagnosis Date   Allergy    Allergy to sulfa drugs 12/10/2020   Arthritis    CAD (coronary artery disease) 6/14   Cardiac catheterization 6/27/ 2014 ejection fraction 35-40%, 30% proximal left circumflex, 100% tiny obtuse marginal 1 with collaterals, 50% LAD, 50% D1, 100% RCA with collaterals   Chicken pox    Chronic back  pain    Colon polyps    Depression    Diabetes mellitus ([place])    Diet controlled- OFF metformin 02-01-20   E coli infection 0/93/8182   Folliculitis 9/93/7169   GERD (gastroesophageal reflux disease)    Hyperlipidemia    Hypertension    Kidney stone    Myocardial infarction [facility])    ? year- cath 2014/ june     Past Surgical History:  Procedure Laterality Date   COLONOSCOPY     CRYO INTERCOSTAL NERVE BLOCK     double sacrum nerve block   epidural injections     multiple- L5 S1 and cervical neck pain issues as well    [place]  2019     Social History   Socioeconomic History   Marital status: Married    Spouse name: Not on file   Number of children: 3   Years of education: Not on file   Highest education level: Bachelor's degree (e.g., BA, AB, BS)  Occupational History   Occupation: retired    Comment: Retired  Tobacco Use   Smoking status: Former    Packs/day: 0.25    Years: 28.00    Total pack years: 7.00    Types: Cigarettes    Start date: 1972    Quit date: 2000    Years since quitting: 23.6   Smokeless tobacco: Never  Vaping Use   Vaping Use: Never used  Substance and Sexual Activity   Alcohol use: No    Alcohol/week: 0.0 standard drinks of alcohol   Drug use: No   Sexual activity: Not on file  Other Topics Concern   Not on file  Social History Narrative   Not on file   Social Determinants of Health   Financial Resource Strain: Low Risk  (06/03/2021)   Overall Financial Resource Strain (CARDIA)    Difficulty of Paying Living Expenses: Not hard at all  Food Insecurity: No Food Insecurity (06/03/2021)   Hunger Vital Sign    Worried About Running Out of Food in the Last Year: Never true    [place] in the Last Year: Never true  Transportation Needs: No Transportation Needs (06/03/2021)   PRAPARE - Hydrologist (Medical): No    Lack of Transportation (Non-Medical): No  Physical Activity: Sufficiently Active (06/03/2021)   Exercise Vital Sign    Days of Exercise per Week: 7 days    Minutes of Exercise per Session: 50 min  Stress: No Stress Concern Present (06/03/2021)   [place]    Feeling of Stress : Only a little  Social Connections: Moderately Integrated (06/03/2021)   Social Connection and Isolation Panel [NHANES]    Frequency of Communication with Friends and Family: More than three times a week    Frequency of Social Gatherings with Friends and Family: More than three times a week     Attends Religious Services: Never    Marine scientist or Organizations: Yes    Attends Music therapist: More than 4 times per year    Marital Status: Married  Human resources officer Violence: Not At Risk (06/03/2021)   Humiliation, Afraid, Rape, and Kick questionnaire    Fear of Current or Ex-Partner: No    Emotionally Abused: No    Physically Abused: No    Sexually Abused: No    Family History  Problem Relation Age of Onset   Melanoma Mother        metastatic   Cancer Mother        melanoma   Hypertension Mother    Arthritis Father    Colon polyps Father    COPD Father    Hyperlipidemia Maternal Grandmother    Heart disease Maternal Grandmother    Arthritis Paternal Grandmother    Clotting disorder Paternal Grandmother    Clotting disorder Paternal Grandfather    Liver cancer Paternal Grandfather    Colon cancer Neg Hx    Esophageal cancer Neg Hx    Rectal cancer Neg Hx    Stomach cancer Neg Hx     ROS: no fevers or chills, productive cough, hemoptysis, dysphasia, odynophagia, melena, hematochezia, dysuria, hematuria, rash, seizure activity, orthopnea, PND, pedal edema, claudication. Remaining systems are negative.  Physical Exam: Well-developed well-nourished in no acute distress.  Skin is warm and dry.  HEENT is normal.  Neck is supple.  Chest is clear to auscultation with normal expansion.  Cardiovascular exam is regular rate and rhythm.  Abdominal exam nontender or distended. No masses palpated. Extremities show no edema. neuro grossly intact   A/P  1 coronary artery disease-patient denies chest pain.  Continue medical therapy with aspirin and statin.  2 ischemic cardiomyopathy-continue Entresto and carvedilol.  Add Farxiga 10 mg daily.  Check potassium and renal function in 1 week.  Repeat echocardiogram.  3 hypertension-patient's blood pressure is controlled.  Continue present medical regimen.  4 hyperlipidemia-continue statin and  Zetia.  5 dilated aortic root-repeat MRI March 2024.  Kirk Ruths, MD

## 2021-10-18 ENCOUNTER — Other Ambulatory Visit: Payer: Self-pay | Admitting: Family Medicine

## 2021-10-18 ENCOUNTER — Encounter: Payer: Self-pay | Admitting: Cardiology

## 2021-10-18 NOTE — Telephone Encounter (Signed)
Patient stated he was not sure if he took morning dose of entresto or of carvedilol. BP 144/80; 160/86. As we were counting his pills, there was an inconsistency in the number as related to when he received his refills. Recommended for patient to use a pill organizer. Patient will get another BP at 2 PM and contact clinic. Patient will forego treadmill exercising today. He stated he will take evening doses at 8 PM instead of 10 PM tonight.

## 2021-10-18 NOTE — Telephone Encounter (Signed)
Patient sent in 2 pm bp 166/85, P 70. He took 0.25 xanax and pressure came down to 148/83, P 64. Patient informed per Dr. Stanford Breed to continue present medications and keep BP diary. Patient will take Entresto and carvedilol at 6 pm and get back on schedule. He stated he bought a pill organizer to help avoid medication dosing issues, stated he was pretty sure he didn't take Entresto this AM.

## 2021-10-20 ENCOUNTER — Encounter: Payer: Self-pay | Admitting: Cardiology

## 2021-10-20 ENCOUNTER — Telehealth: Payer: Self-pay | Admitting: Cardiology

## 2021-10-20 NOTE — Telephone Encounter (Signed)
Pt called with BP 176/94, he is anxious with this, he will take xanax.  He will take another entresto now.  He will send in diary readings of Bp checks to Dr. Stanford Breed tomorrow.  His HR is in the low 60s.   May need higher dose of entresto.  He did have more salt in his food yesterday.

## 2021-10-21 MED ORDER — SACUBITRIL-VALSARTAN 97-103 MG PO TABS: 1.0000 | ORAL_TABLET | Freq: Two times a day (BID) | ORAL | 3 refills | Status: DC

## 2021-10-24 ENCOUNTER — Encounter: Payer: Self-pay | Admitting: Cardiology

## 2021-10-28 ENCOUNTER — Encounter: Payer: Self-pay | Admitting: Cardiology

## 2021-10-28 ENCOUNTER — Ambulatory Visit (INDEPENDENT_AMBULATORY_CARE_PROVIDER_SITE_OTHER): Payer: Medicare Other | Admitting: Cardiology

## 2021-10-28 VITALS — BP 130/68 | HR 76 | Ht 74.0 in | Wt 217.0 lb

## 2021-10-28 DIAGNOSIS — I2510 Atherosclerotic heart disease of native coronary artery without angina pectoris: Secondary | ICD-10-CM

## 2021-10-28 DIAGNOSIS — E1165 Type 2 diabetes mellitus with hyperglycemia: Secondary | ICD-10-CM

## 2021-10-28 DIAGNOSIS — I255 Ischemic cardiomyopathy: Secondary | ICD-10-CM | POA: Diagnosis not present

## 2021-10-28 MED ORDER — DAPAGLIFLOZIN PROPANEDIOL 10 MG PO TABS: 10.0000 mg | ORAL_TABLET | Freq: Every day | ORAL | 3 refills | Status: DC

## 2021-10-28 NOTE — Patient Instructions (Addendum)
Medication Instructions:  Start Fargixa 10 mg every morning before breakfast Continue all other medications *If you need a refill on your cardiac medications before your next appointment, please call your pharmacy*   Lab Work: [facility] in 1 week   Testing/Procedures: Echo   Follow-Up: At [facility], you and your health needs are our priority.  As part of our continuing mission to provide you with exceptional heart care, we have created designated Provider Care Teams.  These Care Teams include your primary Cardiologist (physician) and Advanced Practice Providers (APPs -  Physician Assistants and Nurse Practitioners) who all work together to provide you with the care you need, when you need it.  We recommend signing up for the patient portal called "MyChart".  Sign up information is provided on this After Visit Summary.  MyChart is used to connect with patients for Virtual Visits (Telemedicine).  Patients are able to view lab/test results, encounter notes, upcoming appointments, etc.  Non-urgent messages can be sent to your provider as well.   To learn more about what you can do with MyChart, go to [email or web address].       Your next appointment:  1 year    Call in April to schedule August appointment     The format for your next appointment: Office   Provider:  Dr.Crenshaw   Important Information About Sugar

## 2021-10-29 ENCOUNTER — Ambulatory Visit (HOSPITAL_COMMUNITY): Payer: Medicare Other | Attending: Cardiovascular Disease

## 2021-10-29 ENCOUNTER — Encounter: Payer: Self-pay | Admitting: Cardiology

## 2021-10-29 ENCOUNTER — Other Ambulatory Visit: Payer: Self-pay

## 2021-10-29 DIAGNOSIS — E1165 Type 2 diabetes mellitus with hyperglycemia: Secondary | ICD-10-CM | POA: Diagnosis present

## 2021-10-29 DIAGNOSIS — I2510 Atherosclerotic heart disease of native coronary artery without angina pectoris: Secondary | ICD-10-CM

## 2021-10-29 DIAGNOSIS — I255 Ischemic cardiomyopathy: Secondary | ICD-10-CM | POA: Diagnosis present

## 2021-10-29 LAB — ECHOCARDIOGRAM COMPLETE
Area-P 1/2: 4.21 cm2
S' Lateral: 3.4 cm

## 2021-10-29 NOTE — Telephone Encounter (Signed)
Patient stated his BP is higher than usual. He went back on Entresto 97-103 on Sunday evening after company left. BP 144/80 and 139/84. Denies any symptoms. He stated he is anxious about having echo today and that may be why his BP is higher than usual. He will continue to monitor BP and contact clinic with readings on Thursday.

## 2021-10-31 ENCOUNTER — Encounter: Payer: Self-pay | Admitting: Cardiology

## 2021-11-01 ENCOUNTER — Encounter: Payer: Self-pay | Admitting: Cardiology

## 2021-11-01 ENCOUNTER — Encounter: Payer: Self-pay | Admitting: Family Medicine

## 2021-11-01 ENCOUNTER — Ambulatory Visit (INDEPENDENT_AMBULATORY_CARE_PROVIDER_SITE_OTHER): Payer: Medicare Other | Admitting: Family Medicine

## 2021-11-01 VITALS — BP 130/64 | HR 67 | Temp 97.9°F | Ht 74.0 in | Wt 216.4 lb

## 2021-11-01 DIAGNOSIS — I255 Ischemic cardiomyopathy: Secondary | ICD-10-CM

## 2021-11-01 DIAGNOSIS — I10 Essential (primary) hypertension: Secondary | ICD-10-CM | POA: Diagnosis not present

## 2021-11-01 DIAGNOSIS — R238 Other skin changes: Secondary | ICD-10-CM

## 2021-11-01 NOTE — Progress Notes (Signed)
Established Patient Office Visit  Subjective   Patient ID: Peter Snyder, male    DOB: 1955-10-07  Age: 66 y.o. MRN: [id number]  Chief Complaint  Patient presents with   Mass    Patient complains of mass on flank, x1 day    HPI   Peter Snyder is seen with small erythematous papule left buttock.  He has had these recurrently in the past.  Minimally sensitive.  He had some leftover clindamycin topical which he is applied a couple times.  He does exercise regularly and frequently keeps damp close on sometimes for extended period.  No recent MRSA infections.  No fevers or chills.  He had recent follow-up echo per cardiology with ejection fraction 60 to 65%.  Walking daily.  Overall doing well.  Other issue is his blood pressures been up some past few days.  No dietary changes.  Takes Entresto and also carvedilol.  Blood pressure usually very well controlled and he had some readings out 150s.  He have left messages with cardiology.  No headaches.  No alcohol use.  Does drink a fair amount of caffeine.  Past Medical History:  Diagnosis Date   Allergy    Allergy to sulfa drugs 12/10/2020   Arthritis    CAD (coronary artery disease) 6/14   Cardiac catheterization 6/27/ 2014 ejection fraction 35-40%, 30% proximal left circumflex, 100% tiny obtuse marginal 1 with collaterals, 50% LAD, 50% D1, 100% RCA with collaterals   Chicken pox    Chronic back pain    Colon polyps    Depression    Diabetes mellitus ([place])    Diet controlled- OFF metformin 02-01-20   E coli infection 0/09/6224   Folliculitis 3/33/5456   GERD (gastroesophageal reflux disease)    Hyperlipidemia    Hypertension    Kidney stone    Myocardial infarction [facility])    ? year- cath 2014/ june    Past Surgical History:  Procedure Laterality Date   COLONOSCOPY     CRYO INTERCOSTAL NERVE BLOCK     double sacrum nerve block   epidural injections     multiple- L5 S1 and cervical neck pain issues as well    POLYPECTOMY      [place]  2019    reports that he quit smoking about 23 years ago. His smoking use included cigarettes. He started smoking about 51 years ago. He has a 7.00 pack-year smoking history. He has never used smokeless tobacco. He reports that he does not drink alcohol and does not use drugs. family history includes Arthritis in his father and paternal grandmother; COPD in his father; Cancer in his mother; Clotting disorder in his paternal grandfather and paternal grandmother; Colon polyps in his father; Heart disease in his maternal grandmother; Hyperlipidemia in his maternal grandmother; Hypertension in his mother; Liver cancer in his paternal grandfather; Melanoma in his mother. Allergies  Allergen Reactions   Latex Other (See Comments)    Other   Tape     Use Paper Tape    Sulfa Antibiotics Rash    Review of Systems  Constitutional:  Negative for chills and fever.      Objective:     BP 130/64 (BP Location: Left Arm, Patient Position: Sitting, Cuff Size: Normal)   Pulse 67   Temp 97.9 F (36.6 C) (Oral)   Ht '6\' 2"'$  (1.88 m)   Wt 216 lb 6.4 oz (98.2 kg)   SpO2 99%   BMI 27.78  kg/m    Physical Exam Vitals reviewed.  Constitutional:      Appearance: Normal appearance.  Cardiovascular:     Rate and Rhythm: Normal rate and regular rhythm.  Skin:    Comments: Very small approximately 1 to 2 mm erythematous papule left buttock.  No pustular center.  No vesicle.  No fluctuance.  No surrounding cellulitis  Neurological:     Mental Status: He is alert.      No results found for any visits on 11/01/21.    The 10-year ASCVD risk score (Arnett DK, et al., 2019) is: 23.1%    Assessment & Plan:   #1  small papule left buttock.  No evidence for abscess.  Reassurance.  Continue good antistaph soap such as [employer] 2000.  Change damp clothing after exercise soon as possible  #2 hypertension.  Improved in office today at this time but  several recent elevated readings.  Continue regular exercise habits.  Watch sodium intake.  Be in touch if remains consistently over 664 systolic  No follow-ups on file.    Carolann Littler, MD

## 2021-11-05 LAB — BASIC METABOLIC PANEL
BUN/Creatinine Ratio: 15 (ref 10–24)
BUN: 19 mg/dL (ref 8–27)
CO2: 27 mmol/L (ref 20–29)
Calcium: 9.5 mg/dL (ref 8.6–10.2)
Chloride: 100 mmol/L (ref 96–106)
Creatinine, Ser: 1.23 mg/dL (ref 0.76–1.27)
Glucose: 123 mg/dL — ABNORMAL HIGH (ref 70–99)
Potassium: 4.2 mmol/L (ref 3.5–5.2)
Sodium: 139 mmol/L (ref 134–144)
eGFR: 65 mL/min/{1.73_m2} (ref >59)

## 2021-11-07 ENCOUNTER — Other Ambulatory Visit: Payer: Self-pay | Admitting: Family Medicine

## 2021-11-07 DIAGNOSIS — J019 Acute sinusitis, unspecified: Secondary | ICD-10-CM

## 2021-11-17 ENCOUNTER — Other Ambulatory Visit: Payer: Self-pay | Admitting: Family Medicine

## 2021-11-20 ENCOUNTER — Ambulatory Visit (INDEPENDENT_AMBULATORY_CARE_PROVIDER_SITE_OTHER): Payer: Medicare Other | Admitting: Family Medicine

## 2021-11-20 ENCOUNTER — Encounter: Payer: Self-pay | Admitting: Cardiology

## 2021-11-20 VITALS — BP 142/80 | HR 76 | Temp 98.2°F | Wt 217.4 lb

## 2021-11-20 DIAGNOSIS — E1165 Type 2 diabetes mellitus with hyperglycemia: Secondary | ICD-10-CM | POA: Diagnosis not present

## 2021-11-20 DIAGNOSIS — L290 Pruritus ani: Secondary | ICD-10-CM | POA: Diagnosis not present

## 2021-11-20 DIAGNOSIS — I255 Ischemic cardiomyopathy: Secondary | ICD-10-CM

## 2021-11-20 LAB — POCT GLYCOSYLATED HEMOGLOBIN (HGB A1C): Hemoglobin A1C: 6.5 % — AB (ref 4.0–5.6)

## 2021-11-20 NOTE — Patient Instructions (Addendum)
Follow up with Dr Stanford Breed regarding the blood pressure  Repeat BP here after rest 142/80  A1C stable at 6.5%.

## 2021-11-20 NOTE — Progress Notes (Signed)
Established Patient Office Visit  Subjective   Patient ID: Peter Snyder, male    DOB: 18-Jul-1955  Age: 66 y.o. MRN: [id number]  Chief Complaint  Patient presents with   OTHER    Boiled sx started this Sunday. Red and painful.    Diabetes   Hypertension    HPI   Peter Snyder is here to discuss several items as follows  He has had some ongoing concerns regarding some elevated blood pressure recently.  Several readings 102H systolic and occasionally 150s.  He is followed by cardiology with history of systolic dysfunction and is on Entresto.  He feels like his blood pressures were better controlled when he took lisinopril.  Still exercising regularly.  No exercise intolerance.  No peripheral edema issues.  No chest pain.  Blood pressure varies considerably.  Type 2 diabetes.  Borderline diabetes in the past.  Currently not taking any medications for this.  Follows low glycemic diet.  No polyuria or polydipsia.  Perianal irritation recently.  This has been chronic.  Sometimes has itching.  Chronic intermittent steroid use topically.  Past Medical History:  Diagnosis Date   Allergy    Allergy to sulfa drugs 12/10/2020   Arthritis    CAD (coronary artery disease) 6/14   Cardiac catheterization 6/27/ 2014 ejection fraction 35-40%, 30% proximal left circumflex, 100% tiny obtuse marginal 1 with collaterals, 50% LAD, 50% D1, 100% RCA with collaterals   Chicken pox    Chronic back pain    Colon polyps    Depression    Diabetes mellitus ([place])    Diet controlled- OFF metformin 02-01-20   E coli infection 8/52/7782   Folliculitis 07/08/5359   GERD (gastroesophageal reflux disease)    Hyperlipidemia    Hypertension    Kidney stone    Myocardial infarction [facility])    ? year- cath 2014/ june    Past Surgical History:  Procedure Laterality Date   COLONOSCOPY     CRYO INTERCOSTAL NERVE BLOCK     double sacrum nerve block   epidural injections     multiple- L5 S1 and cervical neck pain issues  as well    POLYPECTOMY     [place]  2019    reports that he quit smoking about 23 years ago. His smoking use included cigarettes. He started smoking about 51 years ago. He has a 7.00 pack-year smoking history. He has never used smokeless tobacco. He reports that he does not drink alcohol and does not use drugs. family history includes Arthritis in his father and paternal grandmother; COPD in his father; Cancer in his mother; Clotting disorder in his paternal grandfather and paternal grandmother; Colon polyps in his father; Heart disease in his maternal grandmother; Hyperlipidemia in his maternal grandmother; Hypertension in his mother; Liver cancer in his paternal grandfather; Melanoma in his mother. Allergies  Allergen Reactions   Latex Other (See Comments)    Other   Tape     Use Paper Tape    Sulfa Antibiotics Rash    Review of Systems  Constitutional:  Negative for malaise/fatigue.  Eyes:  Negative for blurred vision.  Respiratory:  Negative for shortness of breath.   Cardiovascular:  Negative for chest pain.  Neurological:  Negative for dizziness, weakness and headaches.      Objective:     BP (!) 142/80 (BP Location: Left Arm, Cuff Size: Normal)   Pulse 76   Temp 98.2 F (36.8 C) (Oral)  Wt 217 lb 6.4 oz (98.6 kg)   SpO2 96%   BMI 27.91 kg/m    Physical Exam Vitals reviewed.  Constitutional:      Appearance: Normal appearance.  Cardiovascular:     Rate and Rhythm: Normal rate and regular rhythm.  Pulmonary:     Effort: Pulmonary effort is normal.     Breath sounds: Normal breath sounds.  Skin:    Comments: Perianal region reveals atrophic changes.  He does have superficial fissure/split in the skin around 12 o'clock position.  No active bleeding.  No concerning lesions noted.  Neurological:     Mental Status: He is alert.      Results for orders placed or performed in visit on 11/20/21  POC HgB A1c  Result Value Ref  Range   Hemoglobin A1C 6.5 (A) 4.0 - 5.6 %   HbA1c POC (<> result, manual entry)     HbA1c, POC (prediabetic range)     HbA1c, POC (controlled diabetic range)        The 10-year ASCVD risk score (Arnett DK, et al., 2019) is: 26.4%    Assessment & Plan:   #1 type 2 diabetes.  Stable with A1c today 6.5%.  Continue low glycemic diet.  Continue regular exercise.  Recheck A1c in 6 months  #2 hypertension.  Repeat reading today after rest 142/80.  He is encouraged to follow-up with cardiology who is managing this currently.    #3 intermittent perianal irritation.  He has atrophic changes perianal region probably related to chronic topical steroids.  Avoid regular use of steroids.  We will try some Aquaphor topically.   No follow-ups on file.    Carolann Littler, MD

## 2021-11-30 ENCOUNTER — Other Ambulatory Visit: Payer: Self-pay | Admitting: Family Medicine

## 2021-11-30 DIAGNOSIS — J019 Acute sinusitis, unspecified: Secondary | ICD-10-CM

## 2021-12-02 ENCOUNTER — Encounter: Payer: Self-pay | Admitting: Family Medicine

## 2021-12-02 ENCOUNTER — Other Ambulatory Visit: Payer: Self-pay

## 2021-12-02 DIAGNOSIS — J019 Acute sinusitis, unspecified: Secondary | ICD-10-CM

## 2021-12-02 MED ORDER — AZELASTINE HCL 137 MCG/SPRAY NA SOLN: NASAL | 1 refills | Status: DC

## 2021-12-03 ENCOUNTER — Encounter: Payer: Self-pay | Admitting: Family Medicine

## 2021-12-03 ENCOUNTER — Ambulatory Visit (INDEPENDENT_AMBULATORY_CARE_PROVIDER_SITE_OTHER): Payer: Medicare Other | Admitting: Family Medicine

## 2021-12-03 VITALS — BP 120/60 | HR 67 | Temp 97.5°F | Ht 74.0 in | Wt 222.1 lb

## 2021-12-03 DIAGNOSIS — I255 Ischemic cardiomyopathy: Secondary | ICD-10-CM | POA: Diagnosis not present

## 2021-12-03 DIAGNOSIS — J301 Allergic rhinitis due to pollen: Secondary | ICD-10-CM

## 2021-12-03 NOTE — Progress Notes (Signed)
Established Patient Office Visit  Subjective   Patient ID: Peter Snyder, male    DOB: 04/27/1955  Age: 66 y.o. MRN: [id number]  Chief Complaint  Patient presents with   Sinusitis    Patient complains of sinusitis, x4 days    Nasal Congestion    Patient complains of nasal congestion,     HPI   Peter Snyder is seen with some sinus congestion and sneezing with clear mucus past several days.  He thinks this may be allergic.  He already takes Flonase and Astelin daily.  No facial pain or headaches.  No purulent secretions.  This has not impaired his walking.  No associated cough.  Past Medical History:  Diagnosis Date   Allergy    Allergy to sulfa drugs 12/10/2020   Arthritis    CAD (coronary artery disease) 6/14   Cardiac catheterization 6/27/ 2014 ejection fraction 35-40%, 30% proximal left circumflex, 100% tiny obtuse marginal 1 with collaterals, 50% LAD, 50% D1, 100% RCA with collaterals   Chicken pox    Chronic back pain    Colon polyps    Depression    Diabetes mellitus ([place])    Diet controlled- OFF metformin 02-01-20   E coli infection 03/25/3233   Folliculitis 5/73/2202   GERD (gastroesophageal reflux disease)    Hyperlipidemia    Hypertension    Kidney stone    Myocardial infarction [facility])    ? year- cath 2014/ june    Past Surgical History:  Procedure Laterality Date   COLONOSCOPY     CRYO INTERCOSTAL NERVE BLOCK     double sacrum nerve block   epidural injections     multiple- L5 S1 and cervical neck pain issues as well    POLYPECTOMY     [place]  2019    reports that he quit smoking about 23 years ago. His smoking use included cigarettes. He started smoking about 51 years ago. He has a 7.00 pack-year smoking history. He has never used smokeless tobacco. He reports that he does not drink alcohol and does not use drugs. family history includes Arthritis in his father and paternal grandmother; COPD in his father; Cancer in his  mother; Clotting disorder in his paternal grandfather and paternal grandmother; Colon polyps in his father; Heart disease in his maternal grandmother; Hyperlipidemia in his maternal grandmother; Hypertension in his mother; Liver cancer in his paternal grandfather; Melanoma in his mother. Allergies  Allergen Reactions   Latex Other (See Comments)    Other   Tape     Use Paper Tape    Sulfa Antibiotics Rash    Review of Systems  Constitutional:  Negative for chills and fever.  HENT:  Positive for congestion. Negative for sinus pain and sore throat.   Respiratory:  Negative for cough.       Objective:     BP 120/60 (BP Location: Left Arm, Patient Position: Sitting, Cuff Size: Normal)   Pulse 67   Temp (!) 97.5 F (36.4 C) (Oral)   Ht '6\' 2"'$  (1.88 m)   Wt 222 lb 1.6 oz (100.7 kg)   SpO2 99%   BMI 28.52 kg/m    Physical Exam Vitals reviewed.  Constitutional:      General: He is not in acute distress. HENT:     Right Ear: Tympanic membrane normal.     Left Ear: Tympanic membrane normal.     Nose:     Comments: Minimal clear mucous bilaterally.  Nasal mucosa appears normal.  No visible polyps. Cardiovascular:     Rate and Rhythm: Normal rate and regular rhythm.  Pulmonary:     Effort: Pulmonary effort is normal.     Breath sounds: Normal breath sounds.  Neurological:     Mental Status: He is alert.      No results found for any visits on 12/03/21.    The 10-year ASCVD risk score (Arnett DK, et al., 2019) is: 20.3%    Assessment & Plan:   Rhinitis.  Suspect allergic.  Doubt bacterial.  Continue Flonase and Astelin.  Consider over-the-counter Xyzal or Allegra.  Also consider getting back to nasal saline irrigation  No follow-ups on file.    Carolann Littler, MD

## 2021-12-03 NOTE — Patient Instructions (Signed)
Consider addition of over the counter Xyzal or Allegra.

## 2021-12-05 ENCOUNTER — Encounter: Payer: Self-pay | Admitting: Cardiology

## 2021-12-09 ENCOUNTER — Encounter: Payer: Self-pay | Admitting: Family Medicine

## 2021-12-17 ENCOUNTER — Other Ambulatory Visit: Payer: Self-pay | Admitting: Family Medicine

## 2021-12-18 ENCOUNTER — Emergency Department (HOSPITAL_COMMUNITY): Payer: Medicare Other

## 2021-12-18 ENCOUNTER — Other Ambulatory Visit: Payer: Self-pay

## 2021-12-18 ENCOUNTER — Emergency Department (HOSPITAL_COMMUNITY)
Admission: EM | Admit: 2021-12-18 | Discharge: 2021-12-19 | Disposition: A | Discharge status: Home / Self Care | Payer: Medicare Other | Attending: Emergency Medicine | Admitting: Emergency Medicine

## 2021-12-18 DIAGNOSIS — Z7982 Long term (current) use of aspirin: Secondary | ICD-10-CM | POA: Diagnosis not present

## 2021-12-18 DIAGNOSIS — I2510 Atherosclerotic heart disease of native coronary artery without angina pectoris: Secondary | ICD-10-CM | POA: Insufficient documentation

## 2021-12-18 DIAGNOSIS — D649 Anemia, unspecified: Secondary | ICD-10-CM | POA: Insufficient documentation

## 2021-12-18 DIAGNOSIS — Z91040 Latex allergy status: Secondary | ICD-10-CM | POA: Diagnosis not present

## 2021-12-18 DIAGNOSIS — Z79899 Other long term (current) drug therapy: Secondary | ICD-10-CM | POA: Insufficient documentation

## 2021-12-18 DIAGNOSIS — R42 Dizziness and giddiness: Secondary | ICD-10-CM | POA: Diagnosis present

## 2021-12-18 DIAGNOSIS — E119 Type 2 diabetes mellitus without complications: Secondary | ICD-10-CM | POA: Diagnosis not present

## 2021-12-18 DIAGNOSIS — I712 Thoracic aortic aneurysm, without rupture, unspecified: Secondary | ICD-10-CM | POA: Diagnosis not present

## 2021-12-18 DIAGNOSIS — E871 Hypo-osmolality and hyponatremia: Secondary | ICD-10-CM | POA: Diagnosis not present

## 2021-12-18 DIAGNOSIS — I10 Essential (primary) hypertension: Secondary | ICD-10-CM | POA: Insufficient documentation

## 2021-12-18 DIAGNOSIS — R112 Nausea with vomiting, unspecified: Secondary | ICD-10-CM | POA: Insufficient documentation

## 2021-12-18 LAB — BASIC METABOLIC PANEL
Anion gap: 7 (ref 5–15)
BUN: 17 mg/dL (ref 8–23)
CO2: 26 mmol/L (ref 22–32)
Calcium: 9 mg/dL (ref 8.9–10.3)
Chloride: 101 mmol/L (ref 98–111)
Creatinine, Ser: 0.91 mg/dL (ref 0.61–1.24)
GFR, Estimated: >60 mL/min (ref >60)
Glucose, Bld: 111 mg/dL — ABNORMAL HIGH (ref 70–99)
Potassium: 4 mmol/L (ref 3.5–5.1)
Sodium: 134 mmol/L — ABNORMAL LOW (ref 135–145)

## 2021-12-18 LAB — CBC
HCT: 37.7 % — ABNORMAL LOW (ref 39.0–52.0)
Hemoglobin: 12.4 g/dL — ABNORMAL LOW (ref 13.0–17.0)
MCH: 30.6 pg (ref 26.0–34.0)
MCHC: 32.9 g/dL (ref 30.0–36.0)
MCV: 93.1 fL (ref 80.0–100.0)
Platelets: 145 10*3/uL — ABNORMAL LOW (ref 150–400)
RBC: 4.05 MIL/uL — ABNORMAL LOW (ref 4.22–5.81)
RDW: 12.7 % (ref 11.5–15.5)
WBC: 9.6 10*3/uL (ref 4.0–10.5)
nRBC: 0 % (ref 0.0–0.2)

## 2021-12-18 LAB — URINALYSIS, ROUTINE W REFLEX MICROSCOPIC
Bilirubin Urine: NEGATIVE
Glucose, UA: NEGATIVE mg/dL
Hgb urine dipstick: NEGATIVE
Ketones, ur: 5 mg/dL — AB
Leukocytes,Ua: NEGATIVE
Nitrite: NEGATIVE
Protein, ur: NEGATIVE mg/dL
Specific Gravity, Urine: 1.005 (ref 1.005–1.030)
pH: 6 (ref 5.0–8.0)

## 2021-12-18 LAB — CK: Total CK: 204 U/L (ref 49–397)

## 2021-12-18 LAB — TROPONIN I (HIGH SENSITIVITY): Troponin I (High Sensitivity): 9 ng/L (ref <18)

## 2021-12-18 MED ORDER — IOHEXOL 350 MG/ML SOLN
75.0000 mL | Freq: Once | INTRAVENOUS | Status: AC | PRN
  Administered 2021-12-18: 75 mL via INTRAVENOUS

## 2021-12-18 MED ORDER — ACETAMINOPHEN 500 MG PO TABS
1000.0000 mg | ORAL_TABLET | Freq: Once | ORAL | Status: AC
  Administered 2021-12-18: 1000 mg via ORAL
  Filled 2021-12-18: qty 2

## 2021-12-18 MED ORDER — ONDANSETRON HCL 4 MG/2ML IJ SOLN
4.0000 mg | Freq: Once | INTRAMUSCULAR | Status: AC
  Administered 2021-12-18: 4 mg via INTRAVENOUS
  Filled 2021-12-18: qty 2

## 2021-12-18 MED ORDER — KETOROLAC TROMETHAMINE 30 MG/ML IJ SOLN: 30.0000 mg | Freq: Once | INTRAMUSCULAR | Status: DC

## 2021-12-18 MED ORDER — LACTATED RINGERS IV BOLUS
1000.0000 mL | Freq: Once | INTRAVENOUS | Status: AC
  Administered 2021-12-18: 1000 mL via INTRAVENOUS

## 2021-12-18 NOTE — ED Triage Notes (Signed)
Pt BIB EMS from gym.  Feels he worked out too hard and got overheated.  Went back to car and felt dizzy.  Began vomiting.  Called EMS for fluids and zofran.  Given 4 zofran and 200 NS in 20G LAC given by EMS.  CBG 122,

## 2021-12-18 NOTE — ED Provider Notes (Signed)
[facility] DEPT Provider Note   CSN: [id number] Arrival date & time: 12/18/21  1639     History  Chief Complaint  Patient presents with   Emesis   Dizziness    Peter Snyder is a 66 y.o. male.  With PMH of CAD, DM 2, HTN, HLD who presents after episode of dizziness and emesis today after heavy upper arm workout.  Patient says he went to the gym today and was doing a lot of upper body workout which is not typical for him and he was in the gym for a while.  When he was laying back on one of the benches, he began to feel unwell and felt very dizzy worse with head movement and then got up and felt a little unsteady and proceeded to have some vomiting.  When EMS got to him, they did an EKG and told him it did not look like a STEMI.  It was up to him if he wanted to go to the hospital.  He decided with his cardiac history he should come to the hospital.  With this episode he denied any chest pain, no shortness of breath, no facial droop, no numbness or tingling, no focal weakness, no diplopia, no dysarthria, no dysphagia.  He has been ambulatory since the incident and feels a little lightheaded upon standing and getting around but has been able to ambulate.  He feels like he may have just been dehydrated and just need some fluids.  He got some Zofran from EMS with improvement of symptoms.  He endorses a mild generalized headache now but denies any severe headache at the time or neck pain.  He thinks it is more from dehydration since he has not eaten or drink all day.   Emesis Dizziness Associated symptoms: vomiting        Home Medications Prior to Admission medications   Medication Sig Start Date End Date Taking? Authorizing Provider  acetaminophen (TYLENOL) 500 MG tablet Take 500 mg by mouth every 6 (six) hours as needed for moderate pain.    [provider]  ALPRAZolam Duanne Moron) 0.25 MG tablet Take 0.25 mg by mouth daily as needed for anxiety.     [provider]  aspirin 81 MG tablet Take 81 mg by mouth daily.    [provider]  atorvastatin (LIPITOR) 80 MG tablet TAKE 1 TABLET BY MOUTH EVERY DAY 07/18/21   Burchette, Alinda Sierras, MD  Azelastine HCl 137 MCG/SPRAY SOLN PLACE 1 SPRAY INTO BOTH NOSTRILS 2 TIMES DAILY AS DIRECTED 12/02/21   Burchette, Alinda Sierras, MD  carvedilol (COREG) 12.5 MG tablet TAKE 1 TABLET TWICE A DAY WITH A MEAL 09/05/21   Burchette, Alinda Sierras, MD  celecoxib (CELEBREX) 200 MG capsule Take 200 mg by mouth daily.     [provider]  clonazePAM (KLONOPIN) 0.5 MG tablet Take 0.5 mg by mouth at bedtime.     [provider]  diclofenac Sodium (VOLTAREN) 1 % GEL Apply 2 g topically daily as needed (for pain).    [provider]  escitalopram (LEXAPRO) 20 MG tablet Take 20 mg by mouth at bedtime.    [provider]  ezetimibe (ZETIA) 10 MG tablet TAKE 1 TABLET BY MOUTH EVERY DAY 10/18/21   Burchette, Alinda Sierras, MD  gabapentin (NEURONTIN) 600 MG tablet TAKE 1 TABLET BY MOUTH TWICE A DAY 09/05/21   Burchette, Alinda Sierras, MD  LOTEMAX 0.5 % GEL SMARTSIG:1 Drop(s) In Eye(s) Twice Daily PRN 12/06/18  [provider]  pantoprazole (PROTONIX) 40 MG tablet TAKE 1 TABLET BY MOUTH TWICE A DAY 10/21/21   Burchette, Alinda Sierras, MD  RESTASIS 0.05 % ophthalmic emulsion Place 1 drop into both eyes 2 (two) times daily.  11/20/12   [provider]  sacubitril-valsartan (ENTRESTO) 97-103 MG Take 1 tablet by mouth 2 (two) times daily. 10/21/21   Lelon Perla, MD  tacrolimus (PROTOPIC) 0.1 % ointment Apply 1 application  topically daily as needed (for rash). 01/13/19   [provider]  tamsulosin (FLOMAX) 0.4 MG CAPS capsule TAKE 1 CAPSULE EVERY DAY AFTER SUPPER 12/17/21   Burchette, Alinda Sierras, MD  zolpidem (AMBIEN) 10 MG tablet Take 10 mg by mouth at bedtime as needed for sleep.    [provider]      Allergies    Latex, Tape, and Sulfa antibiotics    Review of Systems    Review of Systems  Gastrointestinal:  Positive for vomiting.  Neurological:  Positive for dizziness.    Physical Exam Updated Vital Signs BP (!) 163/90   Pulse 77   Temp 98.4 F (36.9 C) (Oral)   Resp 18   SpO2 99%  Physical Exam Constitutional: Alert and oriented. Pleasant male no acute distress Eyes: Conjunctivae are normal. ENT      Head: Normocephalic and atraumatic.      Nose: No congestion.      Mouth/Throat: Mucous membranes are moist.      Neck: No stridor. Cardiovascular: S1, S2,  Normal and symmetric distal pulses are present in all extremities.Warm and well perfused.  Equal palpable PT and radial pulses. Respiratory: Normal respiratory effort. Breath sounds are normal.  O2 sat 95-99 on RA. Gastrointestinal: Soft and nontender.  Musculoskeletal: Normal range of motion in all extremities.      Right lower leg: No tenderness or edema.      Left lower leg: No tenderness or edema. Neurologic: Normal speech and language. No gross focal neurologic deficits are appreciated. Skin: Skin is warm, dry and intact. No rash noted. Psychiatric: Mood and affect are normal. Speech and behavior are normal.  ED Results / Procedures / Treatments   Labs (all labs ordered are listed, but only abnormal results are displayed) Labs Reviewed  BASIC METABOLIC PANEL - Abnormal; Notable for the following components:      Result Value   Sodium 134 (*)    Glucose, Bld 111 (*)    All other components within normal limits  CBC - Abnormal; Notable for the following components:   RBC 4.05 (*)    Hemoglobin 12.4 (*)    HCT 37.7 (*)    Platelets 145 (*)    All other components within normal limits  URINALYSIS, ROUTINE W REFLEX MICROSCOPIC - Abnormal; Notable for the following components:   Color, Urine STRAW (*)    Ketones, ur 5 (*)    All other components within normal limits    EKG EKG Interpretation  Date/Time:  Wednesday December 18 2021 16:54:00 EDT Ventricular Rate:  69 PR  Interval:  188 QRS Duration: 110 QT Interval:  375 QTC Calculation: 402 R Axis:   -42 Text Interpretation: Sinus rhythm RSR' in V1 or V2, right VCD or RVH Inferior infarct, age indeterminate Rate decreased since last EKG Confirmed by Georgina Snell [phone]) on 12/18/2021 9:29:03 PM  Radiology No results found.  Procedures Procedures  {Document cardiac monitor, telemetry assessment procedure when appropriate:1}  Medications Ordered in ED Medications - No data to display  ED Course/ Medical Decision Making/ A&P                           Medical Decision Making Amount and/or Complexity of Data Reviewed Labs: ordered. Radiology: ordered.  Risk OTC drugs. Prescription drug management.    Final Clinical Impression(s) / ED Diagnoses Final diagnoses:  None    Rx / DC Orders ED Discharge Orders     None

## 2021-12-18 NOTE — ED Provider Triage Note (Signed)
Emergency Medicine Provider Triage Evaluation Note  [facility] , a 66 y.o. male  was evaluated in triage.  Pt complains of weakness. Report not using his normal sleeping aid last night, went to work out today and afterward felt dizzy with bilateral leg weakness with nausea and vomit once.  No fever, headache, cp, sob, focal numbness, focal weakness  Review of Systems  Positive: As above Negative: As above  Physical Exam  BP (!) 167/90 (BP Location: Right Arm)   Pulse 65   Temp 98.3 F (36.8 C) (Oral)   Resp 18   SpO2 96%  Gen:   Awake, no distress   Resp:  Normal effort  MSK:   Moves extremities without difficulty  Other:    Medical Decision Making  Medically screening exam initiated at 5:08 PM.  Appropriate orders placed.  [facility] was informed that the remainder of the evaluation will be completed by another provider, this initial triage assessment does not replace that evaluation, and the importance of remaining in the ED until their evaluation is complete.     Domenic Moras, PA-C 12/18/21 1710

## 2021-12-19 MED ORDER — MECLIZINE HCL 25 MG PO TABS: 25.0000 mg | ORAL_TABLET | Freq: Three times a day (TID) | ORAL | 0 refills | Status: DC | PRN

## 2021-12-19 NOTE — Discharge Instructions (Addendum)
You were seen in the ER today after an episode of dizziness.  We suspect your episode was more likely related to a benign positional vertigo.  It seems to have generally resolved by the time you got to the ER.  You can perform the Epley maneuver as noted in your discharge paperwork to help with this sensation.  I have also given you a prescription for meclizine to help with dizziness.  Make sure to drink plenty of water and keep yourself hydrated.  As we discussed, your blood work, EKG and imaging were overall reassuring.  I do not think this is related to a heart attack or a stroke.  Your CT scan showed no evidence of aneurysms or strokes or severe thinning of your vessels of your neck.  You do have evidence of a thoracic aortic aneurysm which is unchanged.  Follow-up with your doctor regarding your visit to the ER today.  Come back for any severe worsening dizziness especially if it is associated with facial droop, slurred speech or focal weakness in the arms or legs, new chest pain, shortness of breath, or any other symptoms concerning to you.

## 2021-12-20 ENCOUNTER — Ambulatory Visit: Payer: Medicare Other | Admitting: Family Medicine

## 2021-12-20 ENCOUNTER — Ambulatory Visit (INDEPENDENT_AMBULATORY_CARE_PROVIDER_SITE_OTHER): Payer: Medicare Other | Admitting: Family Medicine

## 2021-12-20 ENCOUNTER — Encounter: Payer: Self-pay | Admitting: Family Medicine

## 2021-12-20 VITALS — BP 136/70 | HR 59 | Temp 98.0°F | Ht 74.0 in | Wt 219.8 lb

## 2021-12-20 DIAGNOSIS — E871 Hypo-osmolality and hyponatremia: Secondary | ICD-10-CM | POA: Diagnosis not present

## 2021-12-20 DIAGNOSIS — D649 Anemia, unspecified: Secondary | ICD-10-CM

## 2021-12-20 DIAGNOSIS — I255 Ischemic cardiomyopathy: Secondary | ICD-10-CM | POA: Diagnosis not present

## 2021-12-20 DIAGNOSIS — R42 Dizziness and giddiness: Secondary | ICD-10-CM

## 2021-12-20 NOTE — Progress Notes (Signed)
Established Patient Office Visit  Subjective   Patient ID: Peter Snyder, male    DOB: 11-09-1955  Age: 66 y.o. MRN: [id number]  Chief Complaint  Patient presents with   Hospitalization Follow-up    HPI   Peter Snyder is seen for ER follow-up.  Couple of days ago he developed some dizziness and nausea with eventual vomiting after doing fairly heavy upper body workout with weights.  At 1 point he laid down on the mat and noticed some diaphoresis and dizziness but no chest pains.  He went out to his car and developed increased nausea and vomiting and called 911.  EKG showed no acute changes.  Patient had ongoing nausea and vomiting and was taken in by EMS for further evaluation.  He denied any confusion or any focal weakness.  He had received some Zofran in route which seemed to help with his nausea.  He also received IV fluids.  Evaluation in the ER included EKG which was unremarkable.  CK and troponin were negative.  Hemoglobin was 12 range which was down from previous of 15 with normocytosis.  Electrolytes stable except for sodium 134.  Patient did mention slight vertigo component and questions whether he had transient BPPV.  He did have CT angiogram head and neck to rule out dissection and this was negative.  He had no recent hematemesis and no melena.  Overall feels better at this time.  Still has occasional lightheadedness.  Keeping down fluids.    Past Medical History:  Diagnosis Date   Allergy    Allergy to sulfa drugs 12/10/2020   Arthritis    CAD (coronary artery disease) 6/14   Cardiac catheterization 6/27/ 2014 ejection fraction 35-40%, 30% proximal left circumflex, 100% tiny obtuse marginal 1 with collaterals, 50% LAD, 50% D1, 100% RCA with collaterals   Chicken pox    Chronic back pain    Colon polyps    Depression    Diabetes mellitus ([place])    Diet controlled- OFF metformin 02-01-20   E coli infection 06/26/8784   Folliculitis 7/67/2094   GERD (gastroesophageal reflux disease)     Hyperlipidemia    Hypertension    Kidney stone    Myocardial infarction [facility])    ? year- cath 2014/ june    Past Surgical History:  Procedure Laterality Date   COLONOSCOPY     CRYO INTERCOSTAL NERVE BLOCK     double sacrum nerve block   epidural injections     multiple- L5 S1 and cervical neck pain issues as well    POLYPECTOMY     [place]  2019    reports that he quit smoking about 23 years ago. His smoking use included cigarettes. He started smoking about 51 years ago. He has a 7.00 pack-year smoking history. He has never used smokeless tobacco. He reports that he does not drink alcohol and does not use drugs. family history includes Arthritis in his father and paternal grandmother; COPD in his father; Cancer in his mother; Clotting disorder in his paternal grandfather and paternal grandmother; Colon polyps in his father; Heart disease in his maternal grandmother; Hyperlipidemia in his maternal grandmother; Hypertension in his mother; Liver cancer in his paternal grandfather; Melanoma in his mother. Allergies  Allergen Reactions   Latex Other (See Comments)    Other   Tape     Use Paper Tape    Sulfa Antibiotics Rash    Review of Systems  Constitutional:  Negative  for chills and fever.  Respiratory:  Negative for shortness of breath.   Cardiovascular:  Negative for chest pain.  Neurological:  Positive for dizziness. Negative for weakness.      Objective:     BP 136/70 (BP Location: Left Arm, Patient Position: Sitting, Cuff Size: Normal)   Pulse (!) 59   Temp 98 F (36.7 C) (Oral)   Ht '6\' 2"'$  (1.88 m)   Wt 219 lb 12.8 oz (99.7 kg)   SpO2 99%   BMI 28.22 kg/m  BP Readings from Last 3 Encounters:  12/20/21 136/70  12/19/21 135/88  12/03/21 120/60   Wt Readings from Last 3 Encounters:  12/20/21 219 lb 12.8 oz (99.7 kg)  12/03/21 222 lb 1.6 oz (100.7 kg)  11/20/21 217 lb 6.4 oz (98.6 kg)      Physical  Exam Constitutional:      Appearance: He is well-developed.  HENT:     Right Ear: External ear normal.     Left Ear: External ear normal.  Eyes:     Pupils: Pupils are equal, round, and reactive to light.  Neck:     Thyroid: No thyromegaly.  Cardiovascular:     Rate and Rhythm: Normal rate and regular rhythm.  Pulmonary:     Effort: Pulmonary effort is normal. No respiratory distress.     Breath sounds: Normal breath sounds. No wheezing or rales.  Musculoskeletal:     Cervical back: Neck supple.  Neurological:     General: No focal deficit present.     Mental Status: He is alert and oriented to person, place, and time.     Cranial Nerves: No cranial nerve deficit.     Motor: No weakness.      No results found for any visits on 12/20/21.  Last CBC Lab Results  Component Value Date   WBC 9.6 12/18/2021   HGB 12.4 (L) 12/18/2021   HCT 37.7 (L) 12/18/2021   MCV 93.1 12/18/2021   MCH 30.6 12/18/2021   RDW 12.7 12/18/2021   PLT 145 (L) 46/56/8127   Last metabolic panel Lab Results  Component Value Date   GLUCOSE 111 (H) 12/18/2021   NA 134 (L) 12/18/2021   K 4.0 12/18/2021   CL 101 12/18/2021   CO2 26 12/18/2021   BUN 17 12/18/2021   CREATININE 0.91 12/18/2021   GFRNONAA >60 12/18/2021   CALCIUM 9.0 12/18/2021   PROT 7.3 05/07/2021   ALBUMIN 4.4 05/07/2021   BILITOT 1.0 05/07/2021   ALKPHOS 48 05/07/2021   AST 21 05/07/2021   ALT 24 05/07/2021   ANIONGAP 7 12/18/2021      The 10-year ASCVD risk score (Arnett DK, et al., 2019) is: 24.7%    Assessment & Plan:   Patient seen for recent ER evaluation.  He developed some lightheadedness and nausea and vomiting with question of transient vertigo.  Work-up unrevealing except for slightly low sodium 134 and hemoglobin 12 range which is slightly down from his normal.  No obvious bleeding.  Patient improved at this time.  -Gradually ease back into usual activities -Did recommend repeat CBC in a couple weeks to  assess stability of his mild anemia. -follow up for any recurrent symptoms.     No follow-ups on file.    Carolann Littler, MD

## 2021-12-23 ENCOUNTER — Encounter: Payer: Self-pay | Admitting: Family Medicine

## 2021-12-23 DIAGNOSIS — R42 Dizziness and giddiness: Secondary | ICD-10-CM

## 2021-12-24 ENCOUNTER — Encounter: Payer: Self-pay | Admitting: Family Medicine

## 2021-12-26 ENCOUNTER — Other Ambulatory Visit: Payer: Self-pay | Admitting: Family Medicine

## 2021-12-26 DIAGNOSIS — J019 Acute sinusitis, unspecified: Secondary | ICD-10-CM

## 2021-12-27 ENCOUNTER — Ambulatory Visit (INDEPENDENT_AMBULATORY_CARE_PROVIDER_SITE_OTHER): Payer: Medicare Other | Admitting: Family Medicine

## 2021-12-27 VITALS — BP 120/76 | HR 85 | Temp 97.9°F | Ht 74.0 in | Wt 214.6 lb

## 2021-12-27 DIAGNOSIS — R197 Diarrhea, unspecified: Secondary | ICD-10-CM | POA: Diagnosis not present

## 2021-12-27 DIAGNOSIS — D649 Anemia, unspecified: Secondary | ICD-10-CM

## 2021-12-27 DIAGNOSIS — I255 Ischemic cardiomyopathy: Secondary | ICD-10-CM

## 2021-12-27 LAB — CBC WITH DIFFERENTIAL/PLATELET
Basophils Absolute: 0 10*3/uL (ref 0.0–0.1)
Basophils Relative: 0.9 % (ref 0.0–3.0)
Eosinophils Absolute: 0.1 10*3/uL (ref 0.0–0.7)
Eosinophils Relative: 1.4 % (ref 0.0–5.0)
HCT: 39.7 % (ref 39.0–52.0)
Hemoglobin: 13.1 g/dL (ref 13.0–17.0)
Lymphocytes Relative: 21 % (ref 12.0–46.0)
Lymphs Abs: 1.1 10*3/uL (ref 0.7–4.0)
MCHC: 33.1 g/dL (ref 30.0–36.0)
MCV: 92.9 fl (ref 78.0–100.0)
Monocytes Absolute: 0.3 10*3/uL (ref 0.1–1.0)
Monocytes Relative: 5 % (ref 3.0–12.0)
Neutro Abs: 3.8 10*3/uL (ref 1.4–7.7)
Neutrophils Relative %: 71.7 % (ref 43.0–77.0)
Platelets: 134 10*3/uL — ABNORMAL LOW (ref 150.0–400.0)
RBC: 4.27 Mil/uL (ref 4.22–5.81)
RDW: 14 % (ref 11.5–15.5)
WBC: 5.3 10*3/uL (ref 4.0–10.5)

## 2021-12-27 NOTE — Progress Notes (Signed)
Established Patient Office Visit  Subjective   Patient ID: Peter Snyder, male    DOB: 24-Jun-1955  Age: 66 y.o. MRN: [id number]  Chief Complaint  Patient presents with   Follow-up   Diarrhea    HPI   Peter Snyder is seen with some ongoing intermittent diarrhea symptoms.  He developed some vomiting around the fourth of this month.  His nausea and vomiting have resolved but he still having occasional loose stools.  Has some formed stools intermittently.  No increased frequency.  Usually only going about once per day.  Has taken some Imodium.  Also on Metamucil.  He had recent labs with ER visit hemoglobin had dropped down to 12.4 with MCV of 93.  No recent bloody stools.  No hematemesis.  Colonoscopy was just last year.  No recent epigastric pain.  Past Medical History:  Diagnosis Date   Allergy    Allergy to sulfa drugs 12/10/2020   Arthritis    CAD (coronary artery disease) 6/14   Cardiac catheterization 6/27/ 2014 ejection fraction 35-40%, 30% proximal left circumflex, 100% tiny obtuse marginal 1 with collaterals, 50% LAD, 50% D1, 100% RCA with collaterals   Chicken pox    Chronic back pain    Colon polyps    Depression    Diabetes mellitus ([place])    Diet controlled- OFF metformin 02-01-20   E coli infection 10/03/9468   Folliculitis 9/62/8366   GERD (gastroesophageal reflux disease)    Hyperlipidemia    Hypertension    Kidney stone    Myocardial infarction [facility])    ? year- cath 2014/ june    Past Surgical History:  Procedure Laterality Date   COLONOSCOPY     CRYO INTERCOSTAL NERVE BLOCK     double sacrum nerve block   epidural injections     multiple- L5 S1 and cervical neck pain issues as well    POLYPECTOMY     [place]  2019    reports that he quit smoking about 23 years ago. His smoking use included cigarettes. He started smoking about 51 years ago. He has a 7.00 pack-year smoking history. He has never used smokeless tobacco. He  reports that he does not drink alcohol and does not use drugs. family history includes Arthritis in his father and paternal grandmother; COPD in his father; Cancer in his mother; Clotting disorder in his paternal grandfather and paternal grandmother; Colon polyps in his father; Heart disease in his maternal grandmother; Hyperlipidemia in his maternal grandmother; Hypertension in his mother; Liver cancer in his paternal grandfather; Melanoma in his mother. Allergies  Allergen Reactions   Latex Other (See Comments)    Other   Tape     Use Paper Tape    Sulfa Antibiotics Rash    Review of Systems  Constitutional:  Negative for chills and fever.  Cardiovascular:  Negative for chest pain.  Gastrointestinal:  Positive for diarrhea. Negative for abdominal pain, blood in stool, melena, nausea and vomiting.  Genitourinary:  Negative for dysuria.  Neurological:  Negative for dizziness.      Objective:     BP 120/76 (BP Location: Left Arm, Patient Position: Sitting, Cuff Size: Normal)   Pulse 85   Temp 97.9 F (36.6 C) (Oral)   Ht '6\' 2"'$  (1.88 m)   Wt 214 lb 9.6 oz (97.3 kg)   SpO2 99%   BMI 27.55 kg/m    Physical Exam Vitals reviewed.  Constitutional:  Appearance: Normal appearance.  Cardiovascular:     Rate and Rhythm: Normal rate and regular rhythm.  Pulmonary:     Effort: Pulmonary effort is normal.     Breath sounds: Normal breath sounds.  Abdominal:     General: Bowel sounds are normal.     Palpations: Abdomen is soft.     Tenderness: There is no abdominal tenderness. There is no guarding or rebound.  Neurological:     Mental Status: He is alert.      No results found for any visits on 12/27/21.    The 10-year ASCVD risk score (Arnett DK, et al., 2019) is: 20.3%    Assessment & Plan:   #1 diarrhea.  This is relatively mild and appears to be resolving.  Suspect related to recent GI illness.  Continue good hydration and watch for foods that can exacerbate  diarrhea  #2 recent normocytic anemia on labs through ER.  Repeat labs today with CBC along with iron studies.  Consider GI referral if iron deficiency on labs.   No follow-ups on file.    Carolann Littler, MD

## 2021-12-28 LAB — IRON,TIBC AND FERRITIN PANEL
%SAT: 21 % (calc) (ref 20–48)
Ferritin: 13 ng/mL — ABNORMAL LOW (ref 24–380)
Iron: 89 ug/dL (ref 50–180)
TIBC: 418 mcg/dL (calc) (ref 250–425)

## 2021-12-30 ENCOUNTER — Telehealth: Payer: Self-pay | Admitting: Family Medicine

## 2021-12-30 ENCOUNTER — Encounter: Payer: Self-pay | Admitting: Family Medicine

## 2021-12-30 ENCOUNTER — Ambulatory Visit (INDEPENDENT_AMBULATORY_CARE_PROVIDER_SITE_OTHER): Payer: Medicare Other | Admitting: Family Medicine

## 2021-12-30 VITALS — BP 126/68 | HR 75 | Temp 97.5°F | Ht 74.0 in | Wt 215.4 lb

## 2021-12-30 DIAGNOSIS — E611 Iron deficiency: Secondary | ICD-10-CM | POA: Diagnosis not present

## 2021-12-30 DIAGNOSIS — I255 Ischemic cardiomyopathy: Secondary | ICD-10-CM

## 2021-12-30 NOTE — Telephone Encounter (Signed)
Left detailed voice message on answering machine informing the patient of message below

## 2021-12-30 NOTE — Telephone Encounter (Signed)
Pt wants to know if he needs to stop taking the celecoxib (CELEBREX) 200 MG capsule and aspirin 81 MG tablet seven days before and during the time he is taking the samples for the hemoccult cards. Pt also wants to know when he can start iron supplement.      Good callback number [phone]  Mychart message can be sent as well       Please advise

## 2021-12-30 NOTE — Progress Notes (Signed)
Established Patient Office Visit  Subjective   Patient ID: Peter Snyder, male    DOB: [birth year]  Age: 66 y.o. MRN: [id number]  No chief complaint on file.   HPI   Add is here accompanied by wife to discuss recent labs.  He had recent hemoglobin through ER of 12.4 with MCV of 93.  This was down from hemoglobin of 15 7 months ago.  We had him come in and obtain further labs.  Repeat CBC hemoglobin 13.1.  Did have low ferritin of 13 with iron saturation of 21% and serum iron of 89.  No known history of iron deficiency.  He had colonoscopy 2/22.  He does take Celebrex daily along with Protonix.  No recent melena.  No hematemesis.  No abdominal pain.  Denies any appetite change or weight changes.  Usually avid walker walking several miles per day but recently not able to walk because of foot injury.  Past Medical History:  Diagnosis Date   Allergy    Allergy to sulfa drugs 12/10/2020   Arthritis    CAD (coronary artery disease) 6/14   Cardiac catheterization 6/27/ 2014 ejection fraction 35-40%, 30% proximal left circumflex, 100% tiny obtuse marginal 1 with collaterals, 50% LAD, 50% D1, 100% RCA with collaterals   Chicken pox    Chronic back pain    Colon polyps    Depression    Diabetes mellitus ([place])    Diet controlled- OFF metformin 02-01-20   E coli infection 0/11/3816   Folliculitis 2/99/3716   GERD (gastroesophageal reflux disease)    Hyperlipidemia    Hypertension    Kidney stone    Myocardial infarction [facility])    ? year- cath 2014/ june    Past Surgical History:  Procedure Laterality Date   COLONOSCOPY     CRYO INTERCOSTAL NERVE BLOCK     double sacrum nerve block   epidural injections     multiple- L5 S1 and cervical neck pain issues as well    POLYPECTOMY     [place]  2019    reports that he quit smoking about 23 years ago. His smoking use included cigarettes. He started smoking about 51 years ago. He has a 7.00 pack-year  smoking history. He has never used smokeless tobacco. He reports that he does not drink alcohol and does not use drugs. family history includes Arthritis in his father and paternal grandmother; COPD in his father; Cancer in his mother; Clotting disorder in his paternal grandfather and paternal grandmother; Colon polyps in his father; Heart disease in his maternal grandmother; Hyperlipidemia in his maternal grandmother; Hypertension in his mother; Liver cancer in his paternal grandfather; Melanoma in his mother. Allergies  Allergen Reactions   Latex Other (See Comments)    Other   Tape     Use Paper Tape    Sulfa Antibiotics Rash    Review of Systems  Constitutional:  Negative for chills, fever and weight loss.  Respiratory:  Negative for sputum production.   Cardiovascular:  Negative for chest pain.  Gastrointestinal:  Negative for abdominal pain, blood in stool, melena, nausea and vomiting.  Neurological:  Negative for dizziness.      Objective:     BP 126/68 (BP Location: Left Arm, Patient Position: Sitting, Cuff Size: Normal)   Pulse 75   Temp (!) 97.5 F (36.4 C) (Oral)   Ht '6\' 2"'$  (1.88 m)   Wt 215 lb 6.4 oz (97.7 kg)  SpO2 99%   BMI 27.66 kg/m  BP Readings from Last 3 Encounters:  12/30/21 126/68  12/27/21 120/76  12/20/21 136/70   Wt Readings from Last 3 Encounters:  12/30/21 215 lb 6.4 oz (97.7 kg)  12/27/21 214 lb 9.6 oz (97.3 kg)  12/20/21 219 lb 12.8 oz (99.7 kg)      Physical Exam Vitals reviewed.  Constitutional:      Appearance: Normal appearance.  Cardiovascular:     Rate and Rhythm: Normal rate and regular rhythm.  Pulmonary:     Effort: Pulmonary effort is normal.     Breath sounds: Normal breath sounds. No wheezing or rales.  Neurological:     Mental Status: He is alert.      No results found for any visits on 12/30/21.    The 10-year ASCVD risk score (Arnett DK, et al., 2019) is: 21.9%    Assessment & Plan:   Problem List Items  Addressed This Visit   None Visit Diagnoses     Iron deficiency    -  Primary   Relevant Orders   Hemoccult Cards (X3 cards)     Patient is here with iron deficiency with ferritin of 13 with a borderline low iron saturation.  He had colonoscopy 2/22.  He is on chronic Celebrex.  No recent abdominal pain.  No appetite change or significant weight loss.  No no symptoms to suggest obvious GI loss such as melena.  No known family history of celiac disease.  -Would recommend avoidance of nonsteroidals as much as possible -Hemoccults given -Start over-the-counter iron sulfate 1 daily -Set up GI referral   No follow-ups on file.    Carolann Littler, MD

## 2021-12-31 ENCOUNTER — Encounter: Payer: Self-pay | Admitting: Family Medicine

## 2022-01-02 ENCOUNTER — Other Ambulatory Visit: Payer: Medicare Other

## 2022-01-04 NOTE — Telephone Encounter (Signed)
Try slow FE 45 mg iron supplement one daily.   Slow release might be better tolerated.  [name] MD [facility]

## 2022-01-06 ENCOUNTER — Encounter: Payer: Self-pay | Admitting: Family Medicine

## 2022-01-10 ENCOUNTER — Other Ambulatory Visit (INDEPENDENT_AMBULATORY_CARE_PROVIDER_SITE_OTHER): Payer: Medicare Other

## 2022-01-10 ENCOUNTER — Encounter: Payer: Self-pay | Admitting: Family Medicine

## 2022-01-10 DIAGNOSIS — E611 Iron deficiency: Secondary | ICD-10-CM

## 2022-01-10 LAB — HEMOCCULT SLIDES (X 3 CARDS)
Fecal Occult Blood: NEGATIVE
OCCULT 1: NEGATIVE
OCCULT 2: NEGATIVE
OCCULT 3: NEGATIVE
OCCULT 4: NEGATIVE
OCCULT 5: NEGATIVE

## 2022-01-10 NOTE — Telephone Encounter (Signed)
Patient calling to make sure his stool sample got to the right place

## 2022-01-10 NOTE — Telephone Encounter (Signed)
Noted  

## 2022-01-13 ENCOUNTER — Encounter: Payer: Self-pay | Admitting: Family Medicine

## 2022-01-13 ENCOUNTER — Ambulatory Visit (INDEPENDENT_AMBULATORY_CARE_PROVIDER_SITE_OTHER): Payer: Medicare Other | Admitting: Family Medicine

## 2022-01-13 VITALS — BP 100/60 | HR 65 | Temp 97.5°F | Ht 74.0 in | Wt 216.6 lb

## 2022-01-13 DIAGNOSIS — R0981 Nasal congestion: Secondary | ICD-10-CM

## 2022-01-13 DIAGNOSIS — R059 Cough, unspecified: Secondary | ICD-10-CM | POA: Diagnosis not present

## 2022-01-13 DIAGNOSIS — I255 Ischemic cardiomyopathy: Secondary | ICD-10-CM | POA: Diagnosis not present

## 2022-01-13 LAB — POC COVID19 BINAXNOW: SARS Coronavirus 2 Ag: NEGATIVE

## 2022-01-13 MED ORDER — BENZONATATE 100 MG PO CAPS: ORAL_CAPSULE | ORAL | 0 refills | Status: DC

## 2022-01-13 NOTE — Progress Notes (Signed)
Established Patient Office Visit  Subjective   Patient ID: Peter Snyder, male    DOB: [birth year]  Age: 66 y.o. MRN: [id number]  Chief Complaint  Patient presents with   Nasal Congestion    Patient complains of nasal congestion, x2 days    Cough    Patient complains of cough, Productive cough, x2 days    HPI   Peter Snyder is seen with upper respiratory symptoms with cough.  He was around a niece recently and thinks he may have caught it from her.  His wife also has some laryngitis symptoms.  He relates symptoms of cough, nasal congestion, mild malaise.  Onset a couple days ago.  He is still exercising and walked yesterday.  No fever.  Had some leftover benzonatate but running out.  Requesting refill.  No dyspnea.  No chest pain.  No nausea or vomiting.  Past Medical History:  Diagnosis Date   Allergy    Allergy to sulfa drugs 12/10/2020   Arthritis    CAD (coronary artery disease) 6/14   Cardiac catheterization 6/27/ 2014 ejection fraction 35-40%, 30% proximal left circumflex, 100% tiny obtuse marginal 1 with collaterals, 50% LAD, 50% D1, 100% RCA with collaterals   Chicken pox    Chronic back pain    Colon polyps    Depression    Diabetes mellitus ([place])    Diet controlled- OFF metformin 02-01-20   E coli infection 04/10/5807   Folliculitis 9/83/3825   GERD (gastroesophageal reflux disease)    Hyperlipidemia    Hypertension    Kidney stone    Myocardial infarction [facility])    ? year- cath 2014/ june    Past Surgical History:  Procedure Laterality Date   COLONOSCOPY     CRYO INTERCOSTAL NERVE BLOCK     double sacrum nerve block   epidural injections     multiple- L5 S1 and cervical neck pain issues as well    POLYPECTOMY     [place]  2019    reports that he quit smoking about 23 years ago. His smoking use included cigarettes. He started smoking about 51 years ago. He has a 7.00 pack-year smoking history. He has never used smokeless tobacco.  He reports that he does not drink alcohol and does not use drugs. family history includes Arthritis in his father and paternal grandmother; COPD in his father; Cancer in his mother; Clotting disorder in his paternal grandfather and paternal grandmother; Colon polyps in his father; Heart disease in his maternal grandmother; Hyperlipidemia in his maternal grandmother; Hypertension in his mother; Liver cancer in his paternal grandfather; Melanoma in his mother. Allergies  Allergen Reactions   Latex Other (See Comments)    Other   Tape     Use Paper Tape    Sulfa Antibiotics Rash    Review of Systems  Constitutional:  Negative for chills and fever.  HENT:  Positive for congestion. Negative for sore throat.   Respiratory:  Positive for cough. Negative for shortness of breath and wheezing.   Cardiovascular:  Negative for chest pain.      Objective:     BP 100/60 (BP Location: Left Arm, Patient Position: Sitting, Cuff Size: Normal)   Pulse 65   Temp (!) 97.5 F (36.4 C) (Oral)   Ht '6\' 2"'$  (1.88 m)   Wt 216 lb 9.6 oz (98.2 kg)   SpO2 98%   BMI 27.81 kg/m  BP Readings from Last 3 Encounters:  01/13/22 100/60  12/30/21 126/68  12/27/21 120/76   Wt Readings from Last 3 Encounters:  01/13/22 216 lb 9.6 oz (98.2 kg)  12/30/21 215 lb 6.4 oz (97.7 kg)  12/27/21 214 lb 9.6 oz (97.3 kg)      Physical Exam Vitals reviewed.  Constitutional:      General: He is not in acute distress.    Appearance: Normal appearance. He is not ill-appearing.  HENT:     Right Ear: Tympanic membrane normal.     Left Ear: Tympanic membrane normal.  Cardiovascular:     Rate and Rhythm: Normal rate and regular rhythm.  Pulmonary:     Effort: Pulmonary effort is normal.     Breath sounds: Normal breath sounds. No wheezing or rales.  Musculoskeletal:     Cervical back: Neck supple.  Lymphadenopathy:     Cervical: No cervical adenopathy.  Neurological:     Mental Status: He is alert.      Results  for orders placed or performed in visit on 01/13/22  POC COVID-19  Result Value Ref Range   SARS Coronavirus 2 Ag Negative Negative      The 10-year ASCVD risk score (Arnett DK, et al., 2019) is: 15.1%    Assessment & Plan:   Problem List Items Addressed This Visit   None Visit Diagnoses     Cough, unspecified type    -  Primary   Relevant Orders   POC COVID-19 (Completed)   Nasal congestion       Relevant Orders   POC COVID-19 (Completed)     Suspect viral URI with cough.  COVID test negative.  Nonfocal exam.  Refill Tessalon Perles 100 mg 1-2 every 8 hours as needed for cough.  Follow-up for any fever or worsening symptoms.  He is aware this will likely take 8 to 10 days to run its course and possibly longer for the cough  No follow-ups on file.    Carolann Littler, MD

## 2022-01-15 ENCOUNTER — Encounter: Payer: Self-pay | Admitting: Family Medicine

## 2022-01-15 ENCOUNTER — Ambulatory Visit (INDEPENDENT_AMBULATORY_CARE_PROVIDER_SITE_OTHER): Payer: Medicare Other | Admitting: Family Medicine

## 2022-01-15 ENCOUNTER — Ambulatory Visit: Payer: Medicare Other | Admitting: Family Medicine

## 2022-01-15 VITALS — BP 104/60 | HR 62 | Temp 97.6°F | Ht 74.0 in | Wt 218.2 lb

## 2022-01-15 DIAGNOSIS — J029 Acute pharyngitis, unspecified: Secondary | ICD-10-CM | POA: Diagnosis not present

## 2022-01-15 DIAGNOSIS — R051 Acute cough: Secondary | ICD-10-CM

## 2022-01-15 DIAGNOSIS — R0981 Nasal congestion: Secondary | ICD-10-CM

## 2022-01-15 DIAGNOSIS — I255 Ischemic cardiomyopathy: Secondary | ICD-10-CM

## 2022-01-15 LAB — POC COVID19 BINAXNOW: SARS Coronavirus 2 Ag: NEGATIVE

## 2022-01-15 LAB — POCT INFLUENZA A/B
Influenza A, POC: NEGATIVE
Influenza B, POC: NEGATIVE

## 2022-01-15 LAB — POCT RAPID STREP A (OFFICE): Rapid Strep A Screen: NEGATIVE

## 2022-01-15 MED ORDER — METHYLPREDNISOLONE ACETATE 80 MG/ML IJ SUSP
80.0000 mg | Freq: Once | INTRAMUSCULAR | Status: AC
  Administered 2022-01-15: 80 mg via INTRAMUSCULAR

## 2022-01-15 NOTE — Progress Notes (Signed)
Established Patient Office Visit  Subjective   Patient ID: Peter Snyder, male    DOB: 12-26-1955  Age: 66 y.o. MRN: [id number]  Chief Complaint  Patient presents with   Cough    Patient complains of cough, x1 days, Tried Benzonatate and Mucinex    Hoarse    HPI   Peter Snyder is seen with some persistent cough and nasal congestion and now laryngitis symptoms.  He was seen Monday and felt to have viral URI.  He tried some benzonatate and Mucinex with some relief.  No fever.  No dyspnea.  He feels like he has had difficulty clearing similar sinus congestion and cough previously without prednisone.  No hemoptysis.  Wife has similar symptoms.  Past Medical History:  Diagnosis Date   Allergy    Allergy to sulfa drugs 12/10/2020   Arthritis    CAD (coronary artery disease) 6/14   Cardiac catheterization 6/27/ 2014 ejection fraction 35-40%, 30% proximal left circumflex, 100% tiny obtuse marginal 1 with collaterals, 50% LAD, 50% D1, 100% RCA with collaterals   Chicken pox    Chronic back pain    Colon polyps    Depression    Diabetes mellitus ([place])    Diet controlled- OFF metformin 02-01-20   E coli infection 09/04/3084   Folliculitis 5/78/4696   GERD (gastroesophageal reflux disease)    Hyperlipidemia    Hypertension    Kidney stone    Myocardial infarction [facility])    ? year- cath 2014/ june    Past Surgical History:  Procedure Laterality Date   COLONOSCOPY     CRYO INTERCOSTAL NERVE BLOCK     double sacrum nerve block   epidural injections     multiple- L5 S1 and cervical neck pain issues as well    POLYPECTOMY     [place]  2019    reports that he quit smoking about 23 years ago. His smoking use included cigarettes. He started smoking about 51 years ago. He has a 7.00 pack-year smoking history. He has never used smokeless tobacco. He reports that he does not drink alcohol and does not use drugs. family history includes Arthritis in his father  and paternal grandmother; COPD in his father; Cancer in his mother; Clotting disorder in his paternal grandfather and paternal grandmother; Colon polyps in his father; Heart disease in his maternal grandmother; Hyperlipidemia in his maternal grandmother; Hypertension in his mother; Liver cancer in his paternal grandfather; Melanoma in his mother. Allergies  Allergen Reactions   Latex Other (See Comments)    Other   Tape     Use Paper Tape    Sulfa Antibiotics Rash    Review of Systems  Constitutional:  Negative for chills and fever.  HENT:  Positive for congestion.   Respiratory:  Positive for cough. Negative for shortness of breath.   Cardiovascular:  Negative for chest pain.      Objective:     BP 104/60 (BP Location: Left Arm, Patient Position: Sitting, Cuff Size: Normal)   Pulse 62   Temp 97.6 F (36.4 C) (Oral)   Ht '6\' 2"'$  (1.88 m)   Wt 218 lb 3.2 oz (99 kg)   SpO2 98%   BMI 28.02 kg/m    Physical Exam Vitals reviewed.  Constitutional:      General: He is not in acute distress.    Appearance: He is not ill-appearing.  HENT:     Right Ear: Tympanic membrane normal.  Left Ear: Tympanic membrane normal.     Mouth/Throat:     Mouth: Mucous membranes are moist.     Pharynx: Oropharynx is clear. No oropharyngeal exudate or posterior oropharyngeal erythema.  Cardiovascular:     Rate and Rhythm: Normal rate and regular rhythm.  Pulmonary:     Effort: Pulmonary effort is normal.     Breath sounds: Normal breath sounds. No wheezing or rales.  Musculoskeletal:     Cervical back: Neck supple.  Lymphadenopathy:     Cervical: No cervical adenopathy.  Neurological:     Mental Status: He is alert.      Results for orders placed or performed in visit on 01/15/22  POC COVID-19 BinaxNow  Result Value Ref Range   SARS Coronavirus 2 Ag Negative Negative  POCT Influenza A/B  Result Value Ref Range   Influenza A, POC Negative Negative   Influenza B, POC Negative  Negative  POCT rapid strep A  Result Value Ref Range   Rapid Strep A Screen Negative Negative      The 10-year ASCVD risk score (Arnett DK, et al., 2019) is: 16.1%    Assessment & Plan:   Problem List Items Addressed This Visit   None Visit Diagnoses     Acute cough    -  Primary   Relevant Medications   methylPREDNISolone acetate (DEPO-MEDROL) injection 80 mg (Completed)   Other Relevant Orders   POC COVID-19 BinaxNow (Completed)   POCT Influenza A/B (Completed)   Nasal congestion       Relevant Orders   POC COVID-19 BinaxNow (Completed)   POCT Influenza A/B (Completed)   Sore throat       Relevant Orders   POC COVID-19 BinaxNow (Completed)   POCT rapid strep A (Completed)     -COVID testing, influenza testing, strep testing all negative  -Suspect viral syndrome.  Suspect his laryngitis is related to his overall viral syndrome  -Continue plenty of fluids and rest and Mucinex.  Patient requesting Depo-Medrol injection.  Does not have active wheezing but has benefited from persistent nasal and chest congestion in the past from similar.  We agreed to give Depo-Medrol 80 mg IM  -Follow-up promptly for any fever or worsening symptoms  No follow-ups on file.    Carolann Littler, MD

## 2022-01-16 ENCOUNTER — Encounter: Payer: Self-pay | Admitting: Family Medicine

## 2022-01-17 ENCOUNTER — Other Ambulatory Visit: Payer: Self-pay

## 2022-01-17 DIAGNOSIS — R051 Acute cough: Secondary | ICD-10-CM

## 2022-01-17 MED ORDER — BENZONATATE 100 MG PO CAPS: ORAL_CAPSULE | ORAL | 0 refills | Status: DC

## 2022-01-20 ENCOUNTER — Other Ambulatory Visit: Payer: Self-pay | Admitting: Family Medicine

## 2022-01-21 ENCOUNTER — Encounter: Payer: Self-pay | Admitting: Family Medicine

## 2022-01-21 ENCOUNTER — Ambulatory Visit (INDEPENDENT_AMBULATORY_CARE_PROVIDER_SITE_OTHER): Payer: Medicare Other | Admitting: Family Medicine

## 2022-01-21 VITALS — BP 112/64 | HR 65 | Temp 97.6°F | Ht 74.0 in | Wt 215.5 lb

## 2022-01-21 DIAGNOSIS — E611 Iron deficiency: Secondary | ICD-10-CM

## 2022-01-21 DIAGNOSIS — I255 Ischemic cardiomyopathy: Secondary | ICD-10-CM | POA: Diagnosis not present

## 2022-01-21 DIAGNOSIS — R051 Acute cough: Secondary | ICD-10-CM

## 2022-01-21 LAB — POC COVID19 BINAXNOW: SARS Coronavirus 2 Ag: NEGATIVE

## 2022-01-21 MED ORDER — DOXYCYCLINE HYCLATE 100 MG PO CAPS: 100.0000 mg | ORAL_CAPSULE | Freq: Two times a day (BID) | ORAL | 0 refills | Status: DC

## 2022-01-21 NOTE — Telephone Encounter (Signed)
I spoke with Sanjae at [facility] and rx was called in as instructed below.

## 2022-01-21 NOTE — Progress Notes (Signed)
Established Patient Office Visit  Subjective   Patient ID: Peter Snyder, male    DOB: 05/04/1955  Age: 66 y.o. MRN: [id number]  Chief Complaint  Patient presents with   Follow-up   Hoarse   Cough    Patient complains of cough, Productive cough    HPI   Price continues to battle upper respiratory infection.  This started little over 10 days ago.  He has had progressive nasal congestion and ongoing laryngitis.  His cough is fairly severe at night.  Mostly nonproductive cough.  Does have some increased postnasal drip.  No bloody or purulent nasal secretions.  Wife has had similar symptoms recently.  He denies any fever.  No obvious wheezing.  Has been using some leftover promethazine cough syrup at night with little relief.  Past Medical History:  Diagnosis Date   Allergy    Allergy to sulfa drugs 12/10/2020   Arthritis    CAD (coronary artery disease) 6/14   Cardiac catheterization 6/27/ 2014 ejection fraction 35-40%, 30% proximal left circumflex, 100% tiny obtuse marginal 1 with collaterals, 50% LAD, 50% D1, 100% RCA with collaterals   Chicken pox    Chronic back pain    Colon polyps    Depression    Diabetes mellitus ([place])    Diet controlled- OFF metformin 02-01-20   E coli infection 5/46/2703   Folliculitis 5/00/9381   GERD (gastroesophageal reflux disease)    Hyperlipidemia    Hypertension    Kidney stone    Myocardial infarction [facility])    ? year- cath 2014/ june    Past Surgical History:  Procedure Laterality Date   COLONOSCOPY     CRYO INTERCOSTAL NERVE BLOCK     double sacrum nerve block   epidural injections     multiple- L5 S1 and cervical neck pain issues as well    POLYPECTOMY     [place]  2019    reports that he quit smoking about 23 years ago. His smoking use included cigarettes. He started smoking about 51 years ago. He has a 7.00 pack-year smoking history. He has never used smokeless tobacco. He reports that he does  not drink alcohol and does not use drugs. family history includes Arthritis in his father and paternal grandmother; COPD in his father; Cancer in his mother; Clotting disorder in his paternal grandfather and paternal grandmother; Colon polyps in his father; Heart disease in his maternal grandmother; Hyperlipidemia in his maternal grandmother; Hypertension in his mother; Liver cancer in his paternal grandfather; Melanoma in his mother. Allergies  Allergen Reactions   Latex Other (See Comments)    Other   Tape     Use Paper Tape    Sulfa Antibiotics Rash    Review of Systems  Constitutional:  Negative for chills and fever.  HENT:  Positive for congestion. Negative for sore throat.   Respiratory:  Positive for cough.   Cardiovascular:  Negative for chest pain.      Objective:     BP 112/64 (BP Location: Left Arm, Patient Position: Sitting, Cuff Size: Normal)   Pulse 65   Temp 97.6 F (36.4 C) (Oral)   Ht '6\' 2"'$  (1.88 m)   Wt 215 lb 8 oz (97.8 kg)   SpO2 100%   BMI 27.67 kg/m    Physical Exam Vitals reviewed.  Constitutional:      Appearance: Normal appearance.  HENT:     Right Ear: Tympanic membrane normal.  Left Ear: Tympanic membrane normal.     Mouth/Throat:     Mouth: Mucous membranes are moist.     Pharynx: Oropharynx is clear. No oropharyngeal exudate or posterior oropharyngeal erythema.  Cardiovascular:     Rate and Rhythm: Normal rate and regular rhythm.  Pulmonary:     Effort: Pulmonary effort is normal.     Breath sounds: Normal breath sounds. No wheezing or rales.  Neurological:     Mental Status: He is alert.      Results for orders placed or performed in visit on 01/21/22  POC COVID-19 BinaxNow  Result Value Ref Range   SARS Coronavirus 2 Ag Negative Negative      The 10-year ASCVD risk score (Arnett DK, et al., 2019) is: 18.2%    Assessment & Plan:   Problem List Items Addressed This Visit   None Visit Diagnoses     Acute cough    -   Primary   Relevant Orders   POC COVID-19 BinaxNow (Completed)   Iron deficiency       Relevant Orders   Iron, TIBC and Ferritin Panel   CBC with Differential/Platelet     Patient has persistent upper respiratory symptoms predominantly with nasal congestion and cough.  We explained this may still be viral though he is feeling worse at 10 days vs the beginning.  We decided to go and cover with doxycycline 100 mg twice daily for 10 days.  Be in touch if laryngitis symptoms not improving over the next week.  -Future order written for iron studies approximately 2 months from now after being on iron.  Recent Hemoccults negative.  No follow-ups on file.    Carolann Littler, MD

## 2022-01-22 ENCOUNTER — Other Ambulatory Visit: Payer: Self-pay | Admitting: Family Medicine

## 2022-01-22 DIAGNOSIS — J019 Acute sinusitis, unspecified: Secondary | ICD-10-CM

## 2022-01-24 ENCOUNTER — Other Ambulatory Visit: Payer: Self-pay | Admitting: Family Medicine

## 2022-01-24 ENCOUNTER — Other Ambulatory Visit: Payer: Self-pay | Admitting: Cardiology

## 2022-01-24 ENCOUNTER — Encounter: Payer: Self-pay | Admitting: Family Medicine

## 2022-01-24 DIAGNOSIS — R051 Acute cough: Secondary | ICD-10-CM

## 2022-01-24 DIAGNOSIS — J019 Acute sinusitis, unspecified: Secondary | ICD-10-CM

## 2022-01-24 MED ORDER — BENZONATATE 100 MG PO CAPS: ORAL_CAPSULE | ORAL | 0 refills | Status: DC

## 2022-02-05 ENCOUNTER — Encounter: Payer: Self-pay | Admitting: Family Medicine

## 2022-02-11 ENCOUNTER — Encounter: Payer: Self-pay | Admitting: Family Medicine

## 2022-02-11 ENCOUNTER — Ambulatory Visit (INDEPENDENT_AMBULATORY_CARE_PROVIDER_SITE_OTHER): Payer: Medicare Other | Admitting: Family Medicine

## 2022-02-11 VITALS — BP 130/78 | HR 60 | Temp 98.2°F | Wt 217.8 lb

## 2022-02-11 DIAGNOSIS — R051 Acute cough: Secondary | ICD-10-CM

## 2022-02-11 DIAGNOSIS — I255 Ischemic cardiomyopathy: Secondary | ICD-10-CM | POA: Diagnosis not present

## 2022-02-11 DIAGNOSIS — R059 Cough, unspecified: Secondary | ICD-10-CM | POA: Diagnosis not present

## 2022-02-11 LAB — POCT INFLUENZA A/B
Influenza A, POC: NEGATIVE
Influenza B, POC: NEGATIVE

## 2022-02-11 LAB — POC COVID19 BINAXNOW: SARS Coronavirus 2 Ag: NEGATIVE

## 2022-02-11 MED ORDER — BENZONATATE 100 MG PO CAPS: ORAL_CAPSULE | ORAL | 0 refills | Status: DC

## 2022-02-11 NOTE — Progress Notes (Signed)
Established Patient Office Visit  Subjective   Patient ID: Peter Snyder, male    DOB: [birth year]  Age: 66 y.o. MRN: [id number]  Chief Complaint  Patient presents with   Cough    HPI   Barak is seen with 1 day history of cough and some nasal congestion.  He feels like he is just getting over another respiratory illness which lasted for several weeks with cough.  Denies any fever.  Feels well overall.  In fact, he still worked out fairly vigorously yesterday without any difficulty.  No dyspnea.  No wheezing.  No chest pain.  Past Medical History:  Diagnosis Date   Allergy    Allergy to sulfa drugs 12/10/2020   Arthritis    CAD (coronary artery disease) 6/14   Cardiac catheterization 6/27/ 2014 ejection fraction 35-40%, 30% proximal left circumflex, 100% tiny obtuse marginal 1 with collaterals, 50% LAD, 50% D1, 100% RCA with collaterals   Chicken pox    Chronic back pain    Colon polyps    Depression    Diabetes mellitus ([place])    Diet controlled- OFF metformin 02-01-20   E coli infection 5/80/9983   Folliculitis 3/82/5053   GERD (gastroesophageal reflux disease)    Hyperlipidemia    Hypertension    Kidney stone    Myocardial infarction [facility])    ? year- cath 2014/ june    Past Surgical History:  Procedure Laterality Date   COLONOSCOPY     CRYO INTERCOSTAL NERVE BLOCK     double sacrum nerve block   epidural injections     multiple- L5 S1 and cervical neck pain issues as well    POLYPECTOMY     [place]  2019    reports that he quit smoking about 23 years ago. His smoking use included cigarettes. He started smoking about 51 years ago. He has a 7.00 pack-year smoking history. He has never used smokeless tobacco. He reports that he does not drink alcohol and does not use drugs. family history includes Arthritis in his father and paternal grandmother; COPD in his father; Cancer in his mother; Clotting disorder in his paternal grandfather  and paternal grandmother; Colon polyps in his father; Heart disease in his maternal grandmother; Hyperlipidemia in his maternal grandmother; Hypertension in his mother; Liver cancer in his paternal grandfather; Melanoma in his mother. Allergies  Allergen Reactions   Latex Other (See Comments)    Other   Tape     Use Paper Tape    Sulfa Antibiotics Rash    Review of Systems  Constitutional:  Negative for chills and fever.  HENT:  Positive for congestion. Negative for sore throat.   Respiratory:  Positive for cough.   Cardiovascular:  Negative for chest pain.      Objective:     BP 130/78 (BP Location: Left Arm, Patient Position: Sitting, Cuff Size: Normal)   Pulse 60   Temp 98.2 F (36.8 C) (Oral)   Wt 217 lb 12.8 oz (98.8 kg)   SpO2 99%   BMI 27.96 kg/m    Physical Exam Vitals reviewed.  Constitutional:      Appearance: Normal appearance.  HENT:     Right Ear: Tympanic membrane normal.     Left Ear: Tympanic membrane normal.     Mouth/Throat:     Mouth: Mucous membranes are moist.     Pharynx: Oropharynx is clear. No oropharyngeal exudate or posterior oropharyngeal erythema.  Cardiovascular:  Rate and Rhythm: Normal rate and regular rhythm.  Pulmonary:     Effort: Pulmonary effort is normal.     Breath sounds: Normal breath sounds. No wheezing or rales.  Neurological:     Mental Status: He is alert.      Results for orders placed or performed in visit on 02/11/22  POC COVID-19  Result Value Ref Range   SARS Coronavirus 2 Ag Negative Negative  POC Influenza A/B  Result Value Ref Range   Influenza A, POC Negative Negative   Influenza B, POC Negative Negative      The 10-year ASCVD risk score (Arnett DK, et al., 2019) is: 23.1%    Assessment & Plan:   Problem List Items Addressed This Visit   None Visit Diagnoses     Cough, unspecified type    -  Primary   Relevant Orders   POC COVID-19 (Completed)   POC Influenza A/B (Completed)   Acute  cough       Relevant Medications   benzonatate (TESSALON PERLES) 100 MG capsule     Probable viral URI with cough.  Nonfocal exam.  COVID and influenza screens negative.  -Recommend symptomatic treatment with over-the-counter cough medications as needed.  We did refill Tessalon Perles 100 mg 1-2 every 8 hours as needed for cough if needed -Follow-up immediately for any fever or worsening symptoms  No follow-ups on file.    Carolann Littler, MD

## 2022-02-19 ENCOUNTER — Ambulatory Visit (INDEPENDENT_AMBULATORY_CARE_PROVIDER_SITE_OTHER): Payer: Medicare Other | Admitting: Family Medicine

## 2022-02-19 ENCOUNTER — Encounter: Payer: Self-pay | Admitting: Family Medicine

## 2022-02-19 VITALS — BP 116/70 | HR 62 | Temp 97.7°F | Ht 74.0 in | Wt 219.7 lb

## 2022-02-19 DIAGNOSIS — R0982 Postnasal drip: Secondary | ICD-10-CM

## 2022-02-19 DIAGNOSIS — I255 Ischemic cardiomyopathy: Secondary | ICD-10-CM

## 2022-02-19 DIAGNOSIS — R051 Acute cough: Secondary | ICD-10-CM | POA: Diagnosis not present

## 2022-02-19 MED ORDER — IPRATROPIUM BROMIDE 0.06 % NA SOLN: 1.0000 | Freq: Four times a day (QID) | NASAL | 0 refills | Status: DC | PRN

## 2022-02-19 NOTE — Progress Notes (Signed)
Established Patient Office Visit  Subjective   Patient ID: Peter Snyder, male    DOB: Jul 30, 1955  Age: 66 y.o. MRN: [id number]  Chief Complaint  Patient presents with   Cough    Patient complains of cough, x6 weeks, Productvie cough with yellowish sputum     HPI   Peter Snyder is seen with almost 6-week history of some intermittent coughing.  Particularly bothersome past week or so.  Cough is definitely somewhat intermittent.  He is still been working out with about 30 minutes of aerobic exercise per day plus and resistance training without difficulty.  No increased dyspnea.  No fevers or chills.  No hemoptysis.  No appetite or weight changes.  Using [employer] with some relief.  He is aware of almost daily postnasal drip with mostly clear mucus.  Already takes Flonase and has taken antihistamines without relief.  Has used Astelin in the past.  No purulent secretions.  No localizing facial pain.  He has history of iron deficiency with low ferritin.  Is on iron replacement with plans to repeat iron studies in January.  He has been taking Celebrex 200 mg daily and we advise trying to get off nonsteroidals altogether.  Recent Hemoccults were negative.  Past Medical History:  Diagnosis Date   Allergy    Allergy to sulfa drugs 12/10/2020   Arthritis    CAD (coronary artery disease) 6/14   Cardiac catheterization 6/27/ 2014 ejection fraction 35-40%, 30% proximal left circumflex, 100% tiny obtuse marginal 1 with collaterals, 50% LAD, 50% D1, 100% RCA with collaterals   Chicken pox    Chronic back pain    Colon polyps    Depression    Diabetes mellitus ([place])    Diet controlled- OFF metformin 02-01-20   E coli infection 07/09/9561   Folliculitis 8/75/6433   GERD (gastroesophageal reflux disease)    Hyperlipidemia    Hypertension    Kidney stone    Myocardial infarction [facility])    ? year- cath 2014/ june    Past Surgical History:  Procedure Laterality Date   COLONOSCOPY     CRYO  INTERCOSTAL NERVE BLOCK     double sacrum nerve block   epidural injections     multiple- L5 S1 and cervical neck pain issues as well    POLYPECTOMY     [place]  2019    reports that he quit smoking about 23 years ago. His smoking use included cigarettes. He started smoking about 51 years ago. He has a 7.00 pack-year smoking history. He has never used smokeless tobacco. He reports that he does not drink alcohol and does not use drugs. family history includes Arthritis in his father and paternal grandmother; COPD in his father; Cancer in his mother; Clotting disorder in his paternal grandfather and paternal grandmother; Colon polyps in his father; Heart disease in his maternal grandmother; Hyperlipidemia in his maternal grandmother; Hypertension in his mother; Liver cancer in his paternal grandfather; Melanoma in his mother. Allergies  Allergen Reactions   Latex Other (See Comments)    Other   Tape     Use Paper Tape    Sulfa Antibiotics Rash    Review of Systems  Constitutional:  Negative for chills and fever.  HENT:  Positive for congestion. Negative for nosebleeds and sinus pain.   Respiratory:  Positive for cough. Negative for hemoptysis, shortness of breath and wheezing.   Cardiovascular:  Negative for chest pain.  Objective:     BP 116/70 (BP Location: Left Arm, Patient Position: Sitting, Cuff Size: Normal)   Pulse 62   Temp 97.7 F (36.5 C) (Oral)   Ht '6\' 2"'$  (1.88 m)   Wt 219 lb 11.2 oz (99.7 kg)   SpO2 99%   BMI 28.21 kg/m  BP Readings from Last 3 Encounters:  02/19/22 116/70  02/11/22 130/78  01/21/22 112/64   Wt Readings from Last 3 Encounters:  02/19/22 219 lb 11.2 oz (99.7 kg)  02/11/22 217 lb 12.8 oz (98.8 kg)  01/21/22 215 lb 8 oz (97.8 kg)      Physical Exam Vitals reviewed.  Constitutional:      General: He is not in acute distress.    Appearance: He is not toxic-appearing.  HENT:     Mouth/Throat:      Mouth: Mucous membranes are moist.     Pharynx: Oropharynx is clear.  Cardiovascular:     Rate and Rhythm: Normal rate and regular rhythm.  Pulmonary:     Effort: Pulmonary effort is normal.     Breath sounds: Normal breath sounds. No wheezing or rales.  Neurological:     Mental Status: He is alert.      No results found for any visits on 02/19/22.    The 10-year ASCVD risk score (Arnett DK, et al., 2019) is: 19.2%    Assessment & Plan:   Cough probably secondary to postnasal drip.  He has already tried antihistamines and Flonase without much relief.  We agreed to short-term trial Atrovent nasal 1 spray per nostril twice daily as needed.  -Follow-up promptly for any fever or increased shortness of breath. -Consider PA and lateral chest x-ray if cough not improving in the next couple weeks  No follow-ups on file.    Carolann Littler, MD

## 2022-02-21 ENCOUNTER — Encounter: Payer: Self-pay | Admitting: Family Medicine

## 2022-02-21 ENCOUNTER — Other Ambulatory Visit: Payer: Self-pay

## 2022-02-21 DIAGNOSIS — R051 Acute cough: Secondary | ICD-10-CM

## 2022-02-21 MED ORDER — BENZONATATE 100 MG PO CAPS: ORAL_CAPSULE | ORAL | 0 refills | Status: DC

## 2022-02-23 ENCOUNTER — Encounter: Payer: Self-pay | Admitting: Family Medicine

## 2022-02-24 NOTE — Telephone Encounter (Signed)
Noted  Peni Rupard W Isiaha Greenup MD [facility]  

## 2022-02-25 ENCOUNTER — Encounter: Payer: Self-pay | Admitting: Family Medicine

## 2022-03-02 ENCOUNTER — Encounter: Payer: Self-pay | Admitting: Family Medicine

## 2022-03-03 ENCOUNTER — Other Ambulatory Visit: Payer: Self-pay | Admitting: Family Medicine

## 2022-03-11 ENCOUNTER — Encounter: Payer: Self-pay | Admitting: Family Medicine

## 2022-03-11 ENCOUNTER — Encounter: Payer: Self-pay | Admitting: *Deleted

## 2022-03-11 ENCOUNTER — Telehealth: Payer: Self-pay | Admitting: *Deleted

## 2022-03-11 NOTE — Patient Outreach (Signed)
  Care Coordination   Initial Visit Note   03/11/2022 Name: Peter Snyder MRN: [id number] DOB: 19-Aug-1955  Peter Snyder is a 66 y.o. year old male who sees Burchette, Alinda Sierras, MD for primary care. I spoke with  [facility] by phone today.  What matters to the patients health and wellness today?  No needs    Goals Addressed             This Visit's Progress    COMPLETED: Care coordination activity       Care Coordination Interventions: Reviewed medications with patient and discussed adherence with no needed refills Reviewed scheduled/upcoming provider appointments including sufficient transportation source Assessed social determinant of health barriers Educated on care management services with no needs at this time.         SDOH assessments and interventions completed:  Yes  SDOH Interventions Today    Flowsheet Row Most Recent Value  SDOH Interventions   Food Insecurity Interventions Intervention Not Indicated  Housing Interventions Intervention Not Indicated  Transportation Interventions Intervention Not Indicated  Utilities Interventions Intervention Not Indicated        Care Coordination Interventions:  Yes, provided   Follow up plan: No further intervention required.   Encounter Outcome:  Pt. Visit Completed   Raina Mina, RN Care Management Coordinator [place] Office [phone]

## 2022-03-11 NOTE — Patient Instructions (Signed)
Visit Information  Thank you for taking time to visit with me today. Please don't hesitate to contact me if I can be of assistance to you.   Following are the goals we discussed today:   Goals Addressed             This Visit's Progress    COMPLETED: Care coordination activity       Care Coordination Interventions: Reviewed medications with patient and discussed adherence with no needed refills Reviewed scheduled/upcoming provider appointments including sufficient transportation source Assessed social determinant of health barriers Educated on care management services with no needs at this time.         Please call the care guide team at [phone] if you need to cancel or reschedule your appointment.   If you are experiencing a Mental Health or [place] or need someone to talk to, please call the Suicide and Crisis Lifeline: 988  Patient verbalizes understanding of instructions and care plan provided today and agrees to view in [place]. Active MyChart status and patient understanding of how to access instructions and care plan via MyChart confirmed with patient.     No further follow up required: No follow up needs  Raina Mina, RN Care Management Coordinator [place] Office [phone]

## 2022-03-12 ENCOUNTER — Encounter: Payer: Self-pay | Admitting: Family Medicine

## 2022-03-12 ENCOUNTER — Ambulatory Visit (INDEPENDENT_AMBULATORY_CARE_PROVIDER_SITE_OTHER): Payer: Medicare Other | Admitting: Family Medicine

## 2022-03-12 VITALS — BP 110/66 | HR 80 | Temp 98.2°F | Ht 74.0 in | Wt 220.2 lb

## 2022-03-12 DIAGNOSIS — R238 Other skin changes: Secondary | ICD-10-CM | POA: Diagnosis not present

## 2022-03-12 DIAGNOSIS — I255 Ischemic cardiomyopathy: Secondary | ICD-10-CM

## 2022-03-12 NOTE — Progress Notes (Signed)
Established Patient Office Visit  Subjective   Patient ID: Peter Snyder, male    DOB: [birth year]  Age: 66 y.o. MRN: [id number]  Chief Complaint  Patient presents with   Abcess    Patient complains of abscess on flank, x4 days     HPI   Peter Snyder is seen with initial chief complaint of "abscess "right buttock.  On further discussion, he has sore papule but no true abscess.  He has had history of frequent folliculitis in the past.  He has used topical clindamycin in the past with some benefit.  Denies any fever.  He feels like current painful papule has actually improved some past couple days.  No obvious drainage.  No fever.  No chills.  No history of MRSA.  Past Medical History:  Diagnosis Date   Allergy    Allergy to sulfa drugs 12/10/2020   Arthritis    CAD (coronary artery disease) 6/14   Cardiac catheterization 6/27/ 2014 ejection fraction 35-40%, 30% proximal left circumflex, 100% tiny obtuse marginal 1 with collaterals, 50% LAD, 50% D1, 100% RCA with collaterals   Chicken pox    Chronic back pain    Colon polyps    Depression    Diabetes mellitus ([place])    Diet controlled- OFF metformin 02-01-20   E coli infection 09/24/6267   Folliculitis 4/85/4627   GERD (gastroesophageal reflux disease)    Hyperlipidemia    Hypertension    Kidney stone    Myocardial infarction [facility])    ? year- cath 2014/ june    Past Surgical History:  Procedure Laterality Date   COLONOSCOPY     CRYO INTERCOSTAL NERVE BLOCK     double sacrum nerve block   epidural injections     multiple- L5 S1 and cervical neck pain issues as well    POLYPECTOMY     [place]  2019    reports that he quit smoking about 24 years ago. His smoking use included cigarettes. He started smoking about 52 years ago. He has a 7.00 pack-year smoking history. He has never used smokeless tobacco. He reports that he does not drink alcohol and does not use drugs. family history includes  Arthritis in his father and paternal grandmother; COPD in his father; Cancer in his mother; Clotting disorder in his paternal grandfather and paternal grandmother; Colon polyps in his father; Heart disease in his maternal grandmother; Hyperlipidemia in his maternal grandmother; Hypertension in his mother; Liver cancer in his paternal grandfather; Melanoma in his mother. Allergies  Allergen Reactions   Latex Other (See Comments)    Other   Tape     Use Paper Tape    Sulfa Antibiotics Rash    Review of Systems  Constitutional:  Negative for chills and fever.      Objective:     BP 110/66 (BP Location: Left Arm, Patient Position: Sitting, Cuff Size: Normal)   Pulse 80   Temp 98.2 F (36.8 C) (Oral)   Ht '6\' 2"'$  (1.88 m)   Wt 220 lb 3.2 oz (99.9 kg)   SpO2 96%   BMI 28.27 kg/m  BP Readings from Last 3 Encounters:  03/12/22 110/66  02/19/22 116/70  02/11/22 130/78   Wt Readings from Last 3 Encounters:  03/12/22 220 lb 3.2 oz (99.9 kg)  02/19/22 219 lb 11.2 oz (99.7 kg)  02/11/22 217 lb 12.8 oz (98.8 kg)      Physical Exam Cardiovascular:  Rate and Rhythm: Normal rate and regular rhythm.     Heart sounds: Murmur heard.  Pulmonary:     Effort: Pulmonary effort is normal.     Breath sounds: Normal breath sounds. No wheezing or rales.  Skin:    Comments: He has small erythematous indurated minimally tender papule which is about 5 mm diameter right buttock.  Nonfluctuant.  No pustular center.  Neurological:     Mental Status: He is alert.      No results found for any visits on 03/12/22.    The 10-year ASCVD risk score (Arnett DK, et al., 2019) is: 17.6%    Assessment & Plan:   Small papule right buttock.  No evidence for abscess.  No fluctuance.  No pustule. -Recommend warm compresses or warm bath soaks -Continue topical clindamycin -Continue daily use of good antibacterial soap -Follow-up for any progressive swelling, erythema, or other concerns.  Carolann Littler, MD

## 2022-03-13 ENCOUNTER — Ambulatory Visit: Payer: Medicare Other | Admitting: Family Medicine

## 2022-03-20 ENCOUNTER — Encounter: Payer: Self-pay | Admitting: Family Medicine

## 2022-03-21 ENCOUNTER — Encounter: Payer: Self-pay | Admitting: Family Medicine

## 2022-03-24 ENCOUNTER — Encounter: Payer: Self-pay | Admitting: Family Medicine

## 2022-03-24 ENCOUNTER — Ambulatory Visit (INDEPENDENT_AMBULATORY_CARE_PROVIDER_SITE_OTHER): Payer: Medicare Other | Admitting: Family Medicine

## 2022-03-24 VITALS — BP 100/60 | HR 67 | Temp 98.1°F | Ht 74.0 in | Wt 219.2 lb

## 2022-03-24 DIAGNOSIS — S91302A Unspecified open wound, left foot, initial encounter: Secondary | ICD-10-CM | POA: Diagnosis not present

## 2022-03-24 DIAGNOSIS — T148XXA Other injury of unspecified body region, initial encounter: Secondary | ICD-10-CM

## 2022-03-24 NOTE — Progress Notes (Unsigned)
Established Patient Office Visit  Subjective   Patient ID: Peter Snyder, male    DOB: 1956/02/07  Age: 67 y.o. MRN: [id number]  Chief Complaint  Patient presents with   Foot Pain    Patient complains of left foot pain,    HPI  {History (Optional):23778} Peter Snyder is seen to discuss left foot pain issues.  He actually has seen a podiatrist.  He had recent x-ray of the foot and there is some question of stress fracture.  He is currently in a walking shoe.  There is also apparently some concern expressed regarding possible rheumatoid arthritis.  He brings in copy of several labs that were done.  He has stable hemoglobin 13 range with slightly low platelets 129,000 but this is near his baseline.  He has C-reactive protein less than 1.  Several other labs are still pending including rheumatoid arthritis panel.  He does not describe any recent onset symmetric arthritis symptoms or signs of acute inflammation.  He does have a small split in the skin near the MTP joint left foot and has some soreness there and apparently developed a little bit of redness and was started on Keflex per podiatrist.  No drainage.  He does have type 2 diabetes and has had good control over several years with recent A1c 6.5%.  Past Medical History:  Diagnosis Date   Allergy    Allergy to sulfa drugs 12/10/2020   Arthritis    CAD (coronary artery disease) 6/14   Cardiac catheterization 6/27/ 2014 ejection fraction 35-40%, 30% proximal left circumflex, 100% tiny obtuse marginal 1 with collaterals, 50% LAD, 50% D1, 100% RCA with collaterals   Chicken pox    Chronic back pain    Colon polyps    Depression    Diabetes mellitus ([place])    Diet controlled- OFF metformin 02-01-20   E coli infection 03/18/5850   Folliculitis 7/78/2423   GERD (gastroesophageal reflux disease)    Hyperlipidemia    Hypertension    Kidney stone    Myocardial infarction [facility])    ? year- cath 2014/ june    Past Surgical History:  Procedure  Laterality Date   COLONOSCOPY     CRYO INTERCOSTAL NERVE BLOCK     double sacrum nerve block   epidural injections     multiple- L5 S1 and cervical neck pain issues as well    POLYPECTOMY     [place]  2019    reports that he quit smoking about 24 years ago. His smoking use included cigarettes. He started smoking about 52 years ago. He has a 7.00 pack-year smoking history. He has never used smokeless tobacco. He reports that he does not drink alcohol and does not use drugs. family history includes Arthritis in his father and paternal grandmother; COPD in his father; Cancer in his mother; Clotting disorder in his paternal grandfather and paternal grandmother; Colon polyps in his father; Heart disease in his maternal grandmother; Hyperlipidemia in his maternal grandmother; Hypertension in his mother; Liver cancer in his paternal grandfather; Melanoma in his mother. Allergies  Allergen Reactions   Latex Other (See Comments)    Other   Tape     Use Paper Tape    Sulfa Antibiotics Rash    Review of Systems  Constitutional:  Negative for chills and fever.  Respiratory:  Negative for cough and shortness of breath.   Cardiovascular:  Negative for chest pain.      Objective:  BP 100/60 (BP Location: Left Arm, Patient Position: Sitting, Cuff Size: Normal)   Pulse 67   Temp 98.1 F (36.7 C) (Oral)   Ht '6\' 2"'$  (1.88 m)   Wt 219 lb 3.2 oz (99.4 kg)   SpO2 98%   BMI 28.14 kg/m  {Vitals History (Optional):23777}  Physical Exam Vitals reviewed.  Constitutional:      Appearance: Normal appearance.  Cardiovascular:     Rate and Rhythm: Normal rate and regular rhythm.  Pulmonary:     Effort: Pulmonary effort is normal.     Breath sounds: Normal breath sounds.  Skin:    Comments: Small approximately half centimeter superficial split in the skin left foot over the MTP joint region.  Very mild area of surrounding cellulitis which is about 1 cm.  No  warmth.  No drainage.  Neurological:     Mental Status: He is alert.      No results found for any visits on 03/24/22.  {Labs (Optional):23779}  The 10-year ASCVD risk score (Arnett DK, et al., 2019) is: 15.1%    Assessment & Plan:   #1 small skin wound from split the skin on the left foot with probably early cellulitis changes.  Go ahead with Keflex prescription which is already been prescribed.  Discussed good wound care with soap and water daily and he will apply a little bit of topical Vaseline.  Stay away from irritants such as hydrogen peroxide or Betadine  #2 type 2 diabetes controlled with recent A1c 6.5%.  Continue to watch sugars and starches.  Consider A1c at follow-up     Carolann Littler, MD

## 2022-03-27 ENCOUNTER — Encounter: Payer: Self-pay | Admitting: Family Medicine

## 2022-03-31 ENCOUNTER — Ambulatory Visit: Payer: Medicare Other | Admitting: Family Medicine

## 2022-03-31 ENCOUNTER — Encounter: Payer: Self-pay | Admitting: Family Medicine

## 2022-03-31 ENCOUNTER — Ambulatory Visit (INDEPENDENT_AMBULATORY_CARE_PROVIDER_SITE_OTHER): Payer: Medicare Other | Admitting: Family Medicine

## 2022-03-31 VITALS — BP 100/60 | HR 85 | Temp 98.3°F | Ht 74.0 in | Wt 216.0 lb

## 2022-03-31 DIAGNOSIS — S91302D Unspecified open wound, left foot, subsequent encounter: Secondary | ICD-10-CM | POA: Diagnosis not present

## 2022-03-31 NOTE — Progress Notes (Signed)
Established Patient Office Visit  Subjective   Patient ID: Peter Snyder, male    DOB: 17-Feb-1956  Age: 67 y.o. MRN: [id number]  Chief Complaint  Patient presents with   Foot Injury    HPI   Seen with some ongoing issues of poor healing left lateral foot just proximal to the fifth toe.  He has been wearing a walking shoe and trying to keep pressure off this area.  No redness.  No drainage.  Just finished 7-day course of Keflex.  He had recent sed rate and C-reactive protein through podiatry and these were both normal.  He does have type 2 diabetes with most recent A1c 6.5%.  He has had no significant recent drainage.  He has been mostly using topical Vaseline but did use some Neosporin recently.  Past Medical History:  Diagnosis Date   Allergy    Allergy to sulfa drugs 12/10/2020   Arthritis    CAD (coronary artery disease) 6/14   Cardiac catheterization 6/27/ 2014 ejection fraction 35-40%, 30% proximal left circumflex, 100% tiny obtuse marginal 1 with collaterals, 50% LAD, 50% D1, 100% RCA with collaterals   Chicken pox    Chronic back pain    Colon polyps    Depression    Diabetes mellitus ([place])    Diet controlled- OFF metformin 02-01-20   E coli infection 08/01/15   Folliculitis 4/94/4967   GERD (gastroesophageal reflux disease)    Hyperlipidemia    Hypertension    Kidney stone    Myocardial infarction [facility])    ? year- cath 2014/ june    Past Surgical History:  Procedure Laterality Date   COLONOSCOPY     CRYO INTERCOSTAL NERVE BLOCK     double sacrum nerve block   epidural injections     multiple- L5 S1 and cervical neck pain issues as well    POLYPECTOMY     [place]  2019    reports that he quit smoking about 24 years ago. His smoking use included cigarettes. He started smoking about 52 years ago. He has a 7.00 pack-year smoking history. He has never used smokeless tobacco. He reports that he does not drink alcohol and does not  use drugs. family history includes Arthritis in his father and paternal grandmother; COPD in his father; Cancer in his mother; Clotting disorder in his paternal grandfather and paternal grandmother; Colon polyps in his father; Heart disease in his maternal grandmother; Hyperlipidemia in his maternal grandmother; Hypertension in his mother; Liver cancer in his paternal grandfather; Melanoma in his mother. Allergies  Allergen Reactions   Latex Other (See Comments)    Other   Tape     Use Paper Tape    Sulfa Antibiotics Rash    Review of Systems  Constitutional:  Negative for chills and fever.      Objective:     BP 100/60 (BP Location: Left Arm, Patient Position: Sitting, Cuff Size: Normal)   Pulse 85   Temp 98.3 F (36.8 C) (Oral)   Ht '6\' 2"'$  (1.88 m)   Wt 216 lb (98 kg)   SpO2 98%   BMI 27.73 kg/m  BP Readings from Last 3 Encounters:  03/31/22 100/60  03/24/22 100/60  03/12/22 110/66   Wt Readings from Last 3 Encounters:  03/31/22 216 lb (98 kg)  03/24/22 219 lb 3.2 oz (99.4 kg)  03/12/22 220 lb 3.2 oz (99.9 kg)      Physical Exam Skin:  Comments: Small split in the skin left lateral foot over the metatarsophalangeal joint.  No erythema.  No significant swelling.  Nontender.      No results found for any visits on 03/31/22.    The 10-year ASCVD risk score (Arnett DK, et al., 2019) is: 15.1%    Assessment & Plan:   Skin wound left distal foot over the fifth MTP joint.  No signs of secondary infection.  Low clinical suspicion for osteomyelitis.  Wound is very superficial.  Recent sed rate and C-reactive protein normal .   no cellulitis changes currently.  -Continue daily cleaning with soap and water -Continue topical Vaseline -We did put some Vaseline and a Telfa Coban wrap over this to keep pressure off the area -Leave off Neosporin -Follow-up promptly for signs of secondary infection   Carolann Littler, MD

## 2022-03-31 NOTE — Patient Instructions (Signed)
Continue to clean wound daily with soap and water  Continue with topical vaseline.

## 2022-04-01 ENCOUNTER — Other Ambulatory Visit: Payer: Self-pay

## 2022-04-01 ENCOUNTER — Encounter (HOSPITAL_BASED_OUTPATIENT_CLINIC_OR_DEPARTMENT_OTHER): Payer: Self-pay | Admitting: Emergency Medicine

## 2022-04-01 ENCOUNTER — Emergency Department (HOSPITAL_BASED_OUTPATIENT_CLINIC_OR_DEPARTMENT_OTHER): Payer: Medicare Other

## 2022-04-01 ENCOUNTER — Encounter: Payer: Self-pay | Admitting: Family Medicine

## 2022-04-01 ENCOUNTER — Emergency Department (HOSPITAL_BASED_OUTPATIENT_CLINIC_OR_DEPARTMENT_OTHER)
Admission: EM | Admit: 2022-04-01 | Discharge: 2022-04-01 | Disposition: A | Discharge status: Home / Self Care | Payer: Medicare Other | Attending: Emergency Medicine | Admitting: Emergency Medicine

## 2022-04-01 DIAGNOSIS — Z91040 Latex allergy status: Secondary | ICD-10-CM | POA: Diagnosis not present

## 2022-04-01 DIAGNOSIS — S91312A Laceration without foreign body, left foot, initial encounter: Secondary | ICD-10-CM | POA: Insufficient documentation

## 2022-04-01 DIAGNOSIS — Z7982 Long term (current) use of aspirin: Secondary | ICD-10-CM | POA: Insufficient documentation

## 2022-04-01 DIAGNOSIS — M79672 Pain in left foot: Secondary | ICD-10-CM | POA: Diagnosis present

## 2022-04-01 DIAGNOSIS — X58XXXA Exposure to other specified factors, initial encounter: Secondary | ICD-10-CM | POA: Insufficient documentation

## 2022-04-01 DIAGNOSIS — E119 Type 2 diabetes mellitus without complications: Secondary | ICD-10-CM | POA: Diagnosis not present

## 2022-04-01 NOTE — Discharge Instructions (Signed)
You were seen in the emergency department for a wound on your left foot. Based on examination and xray, this does not appear to be an active infection that has spread into the bone, but an MRI may be needed if this wound continues to have difficulty healing. Please make sure that you follow up with your primary care provider for further evaluation if needed.

## 2022-04-01 NOTE — ED Provider Notes (Signed)
[place] EMERGENCY DEPT Provider Note   CSN: [id number] Arrival date & time: 04/01/22  1534     History Chief Complaint  Patient presents with   Peter Snyder is a 67 y.o. male.   Foot Pain  Patient presented to the emergency department complains of left foot pain.  Patient was started on Keflex and finished his treatment course 3 days ago.  Patient does not feel the Keflex improved the the redness on the left lateral foot.  Patient was initially worked up for concern of possible stress fracture with a negative x-ray several weeks ago.  Patient denies any penetrating injury which caused a laceration on the lateral aspect of his left foot, but states that he just noticed 1 day that he had a slight skin tear on that side.  He has been managing the tear with disinfection using soap and water as well as copious amounts of Vaseline. He was told to not use Neosporin on the wound which she has been avoiding. Patient does currently have type 2 diabetes with most recent A1c at 6.5.  States that pain in left foot is typically minimal but is usually worsened with contact to the area and with prolonged periods of walking. Currently managing pain as needed with tramadol that he has prescribed for low back pain.     Home Medications Prior to Admission medications   Medication Sig Start Date End Date Taking? Authorizing Provider  acetaminophen (TYLENOL) 500 MG tablet Take 500 mg by mouth every 6 (six) hours as needed for moderate pain.    [provider]  ALPRAZolam Duanne Moron) 0.25 MG tablet Take 0.25 mg by mouth daily as needed for anxiety.    [provider]  aspirin 81 MG tablet Take 81 mg by mouth daily.    [provider]  atorvastatin (LIPITOR) 80 MG tablet TAKE 1 TABLET BY MOUTH EVERY DAY 01/24/22   Burchette, Alinda Sierras, MD  Azelastine HCl 137 MCG/SPRAY SOLN PLACE 1 SPRAY INTO BOTH NOSTRILS 2 TIMES DAILY AS DIRECTED 01/24/22   Burchette, Alinda Sierras, MD  carvedilol (COREG) 12.5 MG tablet TAKE 1 TABLET TWICE A DAY WITH A MEAL 01/21/22   Burchette, Alinda Sierras, MD  celecoxib (CELEBREX) 200 MG capsule Take 200 mg by mouth daily.     [provider]  cephALEXin (KEFLEX) 250 MG capsule Take 250 mg by mouth 3 (three) times daily. 03/22/22   [provider]  diclofenac Sodium (VOLTAREN) 1 % GEL Apply 2 g topically daily as needed (for pain).    [provider]  ENTRESTO 97-103 MG TAKE 1 TABLET BY MOUTH TWICE A DAY 01/24/22   Lelon Perla, MD  escitalopram (LEXAPRO) 20 MG tablet Take 20 mg by mouth at bedtime.    [provider]  ezetimibe (ZETIA) 10 MG tablet TAKE 1 TABLET BY MOUTH EVERY DAY 10/18/21   Burchette, Alinda Sierras, MD  gabapentin (NEURONTIN) 600 MG tablet TAKE 1 TABLET BY MOUTH TWICE A DAY 03/03/22   Burchette, Alinda Sierras, MD  LOTEMAX 0.5 % GEL SMARTSIG:1 Drop(s) In Eye(s) Twice Daily PRN 12/06/18   [provider]  pantoprazole (PROTONIX) 40 MG tablet TAKE 1 TABLET BY MOUTH TWICE A DAY 01/24/22   Burchette, Alinda Sierras, MD  RESTASIS 0.05 % ophthalmic emulsion Place 1 drop into both eyes 2 (two) times daily.  11/20/12   [provider]  tacrolimus (PROTOPIC) 0.1 % ointment Apply 1 application  topically daily as  needed (for rash). 01/13/19   [provider]  tamsulosin (FLOMAX) 0.4 MG CAPS capsule TAKE 1 CAPSULE EVERY DAY AFTER SUPPER 12/17/21   Burchette, Alinda Sierras, MD  zolpidem (AMBIEN) 10 MG tablet Take 10 mg by mouth at bedtime as needed for sleep.    [provider]      Allergies    Doxycycline hyclate, Latex, Tape, and Sulfa antibiotics    Review of Systems   Review of Systems  Constitutional:  Negative for chills and fever.  Cardiovascular:  Negative for leg swelling.  Musculoskeletal:  Negative for joint swelling and myalgias.  Skin:  Positive for wound. Negative for pallor and rash.  Neurological:  Negative for weakness and numbness.  All other systems reviewed and are  negative.   Physical Exam Updated Vital Signs BP (!) 149/81 (BP Location: Right Arm)   Pulse 89   Temp 97.8 F (36.6 C)   Resp 18   SpO2 100%  Physical Exam Vitals and nursing note reviewed.  Constitutional:      Appearance: Normal appearance.  HENT:     Head: Normocephalic and atraumatic.     Mouth/Throat:     Mouth: Mucous membranes are moist.  Cardiovascular:     Rate and Rhythm: Normal rate and regular rhythm.     Pulses: Normal pulses.     Heart sounds: Normal heart sounds.  Pulmonary:     Breath sounds: Normal breath sounds.  Abdominal:     General: Abdomen is flat.  Skin:    General: Skin is warm and dry.     Capillary Refill: Capillary refill takes less than 2 seconds.     Findings: Erythema and lesion present. No rash.     Comments: There is a 1 cm linear laceration on the lateral aspect of the left foot along the base of the fifth digit.  There is slight erythema surrounding laceration but appears to be localized and there is not any obvious discharge or drainage coming from wound site.  Wound appears to be in healing process without obvious infection but does appear to be likely healing poorly/slowly.  Neurological:     General: No focal deficit present.     Mental Status: He is alert.  Psychiatric:        Mood and Affect: Mood normal.     ED Results / Procedures / Treatments   Labs (all labs ordered are listed, but only abnormal results are displayed) Labs Reviewed - No data to display  EKG None  Radiology DG Foot Complete Left  Result Date: 04/01/2022 CLINICAL DATA:  Left foot wound involving the small toe. EXAM: LEFT FOOT - COMPLETE 3+ VIEW COMPARISON:  None Available. FINDINGS: Significant MTP joint degenerative changes, most notably the first, second and third joints with possible changes of psoriatic arthritis. No acute bony findings. No destructive bony changes involving the fifth toe to suggest osteomyelitis. IMPRESSION: 1. Significant MTP joint  degenerative changes with possible changes of psoriatic arthritis. 2. No findings for osteomyelitis involving the fifth toe. Electronically Signed   By: Marijo Sanes M.D.   On: 04/01/2022 17:26    Procedures Procedures   Medications Ordered in ED Medications - No data to display  ED Course/ Medical Decision Making/ A&P                             Medical Decision Making Amount and/or Complexity of Data Reviewed Radiology: ordered.  This patient presents to the ED for concern of left foot pain. Differential diagnosis includes diabetic foot wound, osteomyelitis, poor wound healing   Imaging Studies ordered:  I ordered imaging studies including x-ray left foot I independently visualized and interpreted imaging which showed no findings of osteomyelitis of fifth toe I agree with the radiologist interpretation   Medicines ordered and prescription drug management:  I have reviewed the patients home medicines and have made adjustments as needed   Problem List / ED Course:  Patient presented to the emergency room with complaints of left foot pain and poor wound healing.  He reports that he has had a wound on his left foot for several weeks now he was prescribed a course of Keflex which she finished 3 days ago.  Patient denies that Keflex made a significant impact in the wound healing.  Patient was recently seen by his primary care provider yesterday who was not concerned about osteomyelitis at that time.  Based on a reassuring physical exam without any evidence of spreading erythema, purulent discharge, or significant swelling in the joint space, advised patient that he should continue to management with dressing changes and cleaning.  Advised patient that follow-up with primary care provider as needed if wound continues to not heal properly or infection appears to occur.   Final Clinical Impression(s) / ED Diagnoses Final diagnoses:  Laceration of left foot, initial encounter     Rx / DC Orders ED Discharge Orders     None         Luvenia Heller, PA-C 81/84/03 [id number]    Campbell Stall P, DO 60/67/70 [id number]

## 2022-04-01 NOTE — ED Triage Notes (Signed)
Sore on  left little toe.  Recent injury to left foot, ambulatory to triage with walking ortho shoe. Abt keflex finished on Saturday..  Observed approx. 2cm lac with non blanchable redness at pressure point

## 2022-04-03 ENCOUNTER — Encounter: Payer: Self-pay | Admitting: Family Medicine

## 2022-04-09 ENCOUNTER — Encounter: Payer: Self-pay | Admitting: Family Medicine

## 2022-04-10 ENCOUNTER — Other Ambulatory Visit (INDEPENDENT_AMBULATORY_CARE_PROVIDER_SITE_OTHER): Payer: Medicare Other

## 2022-04-10 ENCOUNTER — Other Ambulatory Visit: Payer: Medicare Other

## 2022-04-10 DIAGNOSIS — E611 Iron deficiency: Secondary | ICD-10-CM

## 2022-04-10 LAB — CBC WITH DIFFERENTIAL/PLATELET
Basophils Absolute: 0 10*3/uL (ref 0.0–0.1)
Basophils Relative: 1 % (ref 0.0–3.0)
Eosinophils Absolute: 0.1 10*3/uL (ref 0.0–0.7)
Eosinophils Relative: 2.2 % (ref 0.0–5.0)
HCT: 40.5 % (ref 39.0–52.0)
Hemoglobin: 13.8 g/dL (ref 13.0–17.0)
Lymphocytes Relative: 34.8 % (ref 12.0–46.0)
Lymphs Abs: 1.6 10*3/uL (ref 0.7–4.0)
MCHC: 34.2 g/dL (ref 30.0–36.0)
MCV: 92.7 fl (ref 78.0–100.0)
Monocytes Absolute: 0.4 10*3/uL (ref 0.1–1.0)
Monocytes Relative: 7.6 % (ref 3.0–12.0)
Neutro Abs: 2.6 10*3/uL (ref 1.4–7.7)
Neutrophils Relative %: 54.4 % (ref 43.0–77.0)
Platelets: 154 10*3/uL (ref 150.0–400.0)
RBC: 4.37 Mil/uL (ref 4.22–5.81)
RDW: 13.3 % (ref 11.5–15.5)
WBC: 4.7 10*3/uL (ref 4.0–10.5)

## 2022-04-11 ENCOUNTER — Encounter: Payer: Self-pay | Admitting: Family Medicine

## 2022-04-11 ENCOUNTER — Telehealth: Payer: Self-pay | Admitting: Gastroenterology

## 2022-04-11 ENCOUNTER — Ambulatory Visit (INDEPENDENT_AMBULATORY_CARE_PROVIDER_SITE_OTHER): Payer: Medicare Other | Admitting: Family Medicine

## 2022-04-11 VITALS — BP 100/60 | HR 78 | Temp 98.4°F | Ht 74.0 in | Wt 217.1 lb

## 2022-04-11 DIAGNOSIS — E611 Iron deficiency: Secondary | ICD-10-CM

## 2022-04-11 LAB — IRON,TIBC AND FERRITIN PANEL
%SAT: 16 % (calc) — ABNORMAL LOW (ref 20–48)
Ferritin: 14 ng/mL — ABNORMAL LOW (ref 24–380)
Iron: 63 ug/dL (ref 50–180)
TIBC: 392 mcg/dL (calc) (ref 250–425)

## 2022-04-11 NOTE — Telephone Encounter (Signed)
Called and spoke with patient. I informed him that he has the next available appt as Dr. Loletha Carrow is not in the clinic everyday. Pt verbalized understanding and had no concerns at the end of the call.

## 2022-04-11 NOTE — Telephone Encounter (Signed)
Inbound call from patient requesting to speak with nurse to see if he can get a sooner appointment with Dr. Loletha Carrow than 2/1. Please advise.

## 2022-04-11 NOTE — Progress Notes (Unsigned)
Established Patient Office Visit  Subjective   Patient ID: Peter Snyder, male    DOB: 08/11/1955  Age: 67 y.o. MRN: [id number]  Chief Complaint  Patient presents with   Results    HPI  {History (Optional):23778} [place] is seen for follow-up regarding iron deficiency.  He had follow-up labs yesterday and hemoglobin has improved to 13.8.  Unfortunately though his ferritin is still low at 14.  Saturation of 16%.  Serum iron 63.  He had recent Hemoccults which were negative.  Remains on Slow Fe 1 daily.  He does consume significant amount of caffeine usually with 1 to 2 cups of coffee in the morning and several glass of tea per day.  Recently came off nonsteroidals.  No abdominal pain.  No melena.  Colonoscopy 2/22.  He has pending follow-up with GI  Past Medical History:  Diagnosis Date   Allergy    Allergy to sulfa drugs 12/10/2020   Arthritis    CAD (coronary artery disease) 6/14   Cardiac catheterization 6/27/ 2014 ejection fraction 35-40%, 30% proximal left circumflex, 100% tiny obtuse marginal 1 with collaterals, 50% LAD, 50% D1, 100% RCA with collaterals   Chicken pox    Chronic back pain    Colon polyps    Depression    Diabetes mellitus ([place])    Diet controlled- OFF metformin 02-01-20   E coli infection 06/23/8117   Folliculitis 1/47/8295   GERD (gastroesophageal reflux disease)    Hyperlipidemia    Hypertension    Kidney stone    Myocardial infarction [facility])    ? year- cath 2014/ june    Past Surgical History:  Procedure Laterality Date   COLONOSCOPY     CRYO INTERCOSTAL NERVE BLOCK     double sacrum nerve block   epidural injections     multiple- L5 S1 and cervical neck pain issues as well    POLYPECTOMY     [place]  2019    reports that he quit smoking about 24 years ago. His smoking use included cigarettes. He started smoking about 52 years ago. He has a 7.00 pack-year smoking history. He has never used smokeless tobacco. He  reports that he does not drink alcohol and does not use drugs. family history includes Arthritis in his father and paternal grandmother; COPD in his father; Cancer in his mother; Clotting disorder in his paternal grandfather and paternal grandmother; Colon polyps in his father; Heart disease in his maternal grandmother; Hyperlipidemia in his maternal grandmother; Hypertension in his mother; Liver cancer in his paternal grandfather; Melanoma in his mother. Allergies  Allergen Reactions   Doxycycline Hyclate Nausea And Vomiting   Latex Other (See Comments)    Other   Tape     Use Paper Tape    Sulfa Antibiotics Rash    Review of Systems  Constitutional:  Negative for chills, fever and weight loss.  Respiratory:  Negative for shortness of breath.   Cardiovascular:  Negative for chest pain.  Gastrointestinal:  Negative for abdominal pain, blood in stool, constipation, diarrhea and melena.      Objective:     BP 100/60 (BP Location: Left Arm, Patient Position: Sitting, Cuff Size: Normal)   Pulse 78   Temp 98.4 F (36.9 C) (Oral)   Ht '6\' 2"'$  (1.88 m)   Wt 217 lb 1.6 oz (98.5 kg)   SpO2 96%   BMI 27.87 kg/m  {Vitals History (Optional):23777}  Physical Exam Vitals reviewed.  Constitutional:      Appearance: Normal appearance.  Cardiovascular:     Rate and Rhythm: Normal rate and regular rhythm.  Pulmonary:     Effort: Pulmonary effort is normal.     Breath sounds: Normal breath sounds. No wheezing or rales.  Neurological:     Mental Status: He is alert.      No results found for any visits on 04/11/22.  {Labs (Optional):23779}  The 10-year ASCVD risk score (Arnett DK, et al., 2019) is: 15.1%    Assessment & Plan:   Persistent iron deficiency manifesting with low serum ferritin and low iron saturation but now normal hemoglobin.  Recent Hemoccults negative.  -Continue replacement with Slow Fe -We did recommend try to gradually scale back his caffeine intake  especially tea consumption -Avoid regular use of non-steroidals -He has pending follow-up with GI   Carolann Littler, MD

## 2022-04-11 NOTE — Patient Instructions (Addendum)
Continue with the iron supplement  Try to gradually reduce caffeine/tea consumption     Consider repeat Ferritin in about 3 months.

## 2022-04-14 ENCOUNTER — Ambulatory Visit: Payer: BLUE CROSS/BLUE SHIELD | Admitting: Family Medicine

## 2022-04-17 ENCOUNTER — Ambulatory Visit (INDEPENDENT_AMBULATORY_CARE_PROVIDER_SITE_OTHER): Payer: Medicare Other | Admitting: Gastroenterology

## 2022-04-17 ENCOUNTER — Encounter: Payer: Self-pay | Admitting: Gastroenterology

## 2022-04-17 VITALS — BP 120/80 | HR 88 | Ht 74.0 in | Wt 217.0 lb

## 2022-04-17 DIAGNOSIS — D508 Other iron deficiency anemias: Secondary | ICD-10-CM | POA: Diagnosis not present

## 2022-04-17 DIAGNOSIS — K219 Gastro-esophageal reflux disease without esophagitis: Secondary | ICD-10-CM

## 2022-04-17 NOTE — Patient Instructions (Signed)
_______________________________________________________  If your blood pressure at your visit was 140/90 or greater, please contact your primary care physician to follow up on this.  _______________________________________________________  If you are age 67 or older, your body mass index should be between 23-30. Your Body mass index is 27.86 kg/m. If this is out of the aforementioned range listed, please consider follow up with your Primary Care Provider.  If you are age 57 or younger, your body mass index should be between 19-25. Your Body mass index is 27.86 kg/m. If this is out of the aformentioned range listed, please consider follow up with your Primary Care Provider.   ________________________________________________________  The [facility] providers would like to encourage you to use [facility] to communicate with providers for non-urgent requests or questions.  Due to long hold times on the telephone, sending your provider a message by [facility] may be a faster and more efficient way to get a response.  Please allow 48 business hours for a response.  Please remember that this is for non-urgent requests.  _______________________________________________________  Peter Snyder have been scheduled for an endoscopy. Please follow written instructions given to you at your visit today. If you use inhalers (even only as needed), please bring them with you on the day of your procedure.  It was a pleasure to see you today!  Thank you for trusting me with your gastrointestinal care!

## 2022-04-17 NOTE — Progress Notes (Signed)
[facility] Consult Note:  History: Peter Snyder 04/17/2022  Referring provider: [name], MD  Reason for consult/chief complaint: Iron Def (Just want to discuss blood work and dosage of medication)   Subjective  HPI: [place] last saw me for surveillance colonoscopy in February 2022, at which time he had several diminutive adenomatous and hyperplastic polyps.  Subcentimeter adenomatous and serrated polyps in 2018 and 2013.  He was referred here today by Dr. Elease Hashimoto for evaluation of iron deficiency anemia.  He was mildly anemic several months ago, and further lab tests showed a low ferritin.  He was put on iron sulfate that caused some dyspepsia so after were changed to once daily slow release iron. He has been taking Celebrex regularly for arthralgias, so there seems to been some concern that perhaps he may have a source of upper digestive blood loss such as an ulcer.  He has been on pantoprazole once daily for many years due to reflux symptoms and has previously attempted to wean down or stop the medicine.  He recently went up to twice a day on the pantoprazole with the iron caused dyspepsia, but that back down to once daily. He denies nausea, vomiting early satiety altered bowel habits rectal bleeding or weight loss. Denies dysphagia or odynophagia.  ROS:  Review of Systems  Constitutional:  Negative for appetite change and unexpected weight change.  HENT:  Negative for mouth sores and voice change.   Eyes:  Negative for pain and redness.  Respiratory:  Negative for cough and shortness of breath.   Cardiovascular:  Negative for chest pain and palpitations.  Genitourinary:  Negative for dysuria and hematuria.  Musculoskeletal:  Positive for arthralgias. Negative for myalgias.       Left foot pain, recently had some treatment for that  Skin:  Negative for pallor and rash.  Neurological:  Negative for weakness and headaches.  Hematological:  Negative  for adenopathy.     Past Medical History: Past Medical History:  Diagnosis Date   Allergy    Allergy to sulfa drugs 12/10/2020   Arthritis    CAD (coronary artery disease) 6/14   Cardiac catheterization 6/27/ 2014 ejection fraction 35-40%, 30% proximal left circumflex, 100% tiny obtuse marginal 1 with collaterals, 50% LAD, 50% D1, 100% RCA with collaterals   Chicken pox    Chronic back pain    Colon polyps    Depression    Diabetes mellitus ([place])    Diet controlled- OFF metformin 02-01-20   E coli infection 1/61/0960   Folliculitis 4/54/0981   GERD (gastroesophageal reflux disease)    Hyperlipidemia    Hypertension    Kidney stone    Myocardial infarction [facility])    ? year- cath 2014/ june      Past Surgical History: Past Surgical History:  Procedure Laterality Date   COLONOSCOPY     CRYO INTERCOSTAL NERVE BLOCK     double sacrum nerve block   epidural injections     multiple- L5 S1 and cervical neck pain issues as well    POLYPECTOMY     [place]  2019     Family History: Family History  Problem Relation Age of Onset   Melanoma Mother        metastatic   Cancer Mother        melanoma   Hypertension Mother    Arthritis Father    Colon polyps Father    COPD Father  Hyperlipidemia Maternal Grandmother    Heart disease Maternal Grandmother    Arthritis Paternal Grandmother    Clotting disorder Paternal Grandmother    Clotting disorder Paternal Grandfather    Liver cancer Paternal Grandfather    Colon cancer Neg Hx    Esophageal cancer Neg Hx    Rectal cancer Neg Hx    Stomach cancer Neg Hx     Social History: Social History   Socioeconomic History   Marital status: Married    Spouse name: Not on file   Number of children: 3   Years of education: Not on file   Highest education level: Bachelor's degree (e.g., BA, AB, BS)  Occupational History   Occupation: retired    Comment: Retired  Tobacco Use   Smoking  status: Former    Packs/day: 0.25    Years: 28.00    Total pack years: 7.00    Types: Cigarettes    Start date: 1972    Quit date: 2000    Years since quitting: 24.1   Smokeless tobacco: Never  Vaping Use   Vaping Use: Never used  Substance and Sexual Activity   Alcohol use: No    Alcohol/week: 0.0 standard drinks of alcohol   Drug use: No   Sexual activity: Not on file  Other Topics Concern   Not on file  Social History Narrative   Not on file   Social Determinants of Health   Financial Resource Strain: Low Risk  (02/19/2022)   Overall Financial Resource Strain (CARDIA)    Difficulty of Paying Living Expenses: Not hard at all  Food Insecurity: No Food Insecurity (03/11/2022)   Hunger Vital Sign    Worried About Running Out of Food in the Last Year: Never true    [place] in the Last Year: Never true  Transportation Needs: No Transportation Needs (03/11/2022)   PRAPARE - Hydrologist (Medical): No    Lack of Transportation (Non-Medical): No  Physical Activity: Sufficiently Active (02/19/2022)   Exercise Vital Sign    Days of Exercise per Week: 6 days    Minutes of Exercise per Session: 60 min  Stress: No Stress Concern Present (02/19/2022)   [place]    Feeling of Stress : Only a little  Social Connections: Socially Integrated (02/19/2022)   Social Connection and Isolation Panel [NHANES]    Frequency of Communication with Friends and Family: More than three times a week    Frequency of Social Gatherings with Friends and Family: More than three times a week    Attends Religious Services: 1 to 4 times per year    Active Member of [employer] or Organizations: Yes    Attends Archivist Meetings: 1 to 4 times per year    Marital Status: Married    Allergies: Allergies  Allergen Reactions   Doxycycline Hyclate Nausea And Vomiting   Latex Other (See Comments)     Other   Tape     Use Paper Tape    Sulfa Antibiotics Rash    Outpatient Meds: Current Outpatient Medications  Medication Sig Dispense Refill   acetaminophen (TYLENOL) 500 MG tablet Take 500 mg by mouth every 6 (six) hours as needed for moderate pain.     ALPRAZolam (XANAX) 0.25 MG tablet Take 0.25 mg by mouth daily as needed for anxiety.     aspirin 81 MG tablet Take 81 mg by mouth daily.  atorvastatin (LIPITOR) 80 MG tablet TAKE 1 TABLET BY MOUTH EVERY DAY 90 tablet 1   Azelastine HCl 137 MCG/SPRAY SOLN PLACE 1 SPRAY INTO BOTH NOSTRILS 2 TIMES DAILY AS DIRECTED 90 mL 1   carvedilol (COREG) 12.5 MG tablet TAKE 1 TABLET TWICE A DAY WITH A MEAL 180 tablet 0   diclofenac Sodium (VOLTAREN) 1 % GEL Apply 2 g topically daily as needed (for pain).     ENTRESTO 97-103 MG TAKE 1 TABLET BY MOUTH TWICE A DAY 60 tablet 3   escitalopram (LEXAPRO) 20 MG tablet Take 20 mg by mouth at bedtime.     ezetimibe (ZETIA) 10 MG tablet TAKE 1 TABLET BY MOUTH EVERY DAY 90 tablet 1   gabapentin (NEURONTIN) 600 MG tablet TAKE 1 TABLET BY MOUTH TWICE A DAY 180 tablet 1   LOTEMAX 0.5 % GEL SMARTSIG:1 Drop(s) In Eye(s) Twice Daily PRN     pantoprazole (PROTONIX) 40 MG tablet TAKE 1 TABLET BY MOUTH TWICE A DAY 180 tablet 0   RESTASIS 0.05 % ophthalmic emulsion Place 1 drop into both eyes 2 (two) times daily.      SANTYL 250 UNIT/GM ointment Apply 1 Application topically daily.     tacrolimus (PROTOPIC) 0.1 % ointment Apply 1 application  topically daily as needed (for rash).     tamsulosin (FLOMAX) 0.4 MG CAPS capsule TAKE 1 CAPSULE EVERY DAY AFTER SUPPER 90 capsule 0   ULTRAM 50 MG tablet Take 50 mg by mouth 3 (three) times daily as needed.     zolpidem (AMBIEN) 10 MG tablet Take 10 mg by mouth at bedtime as needed for sleep.     No current facility-administered medications for this visit.      ___________________________________________________________________ Objective   Exam:  BP 120/80 (BP Location:  Left Arm, Patient Position: Sitting, Cuff Size: Normal)   Pulse 88   Ht '6\' 2"'$  (1.88 m)   Wt 217 lb (98.4 kg)   BMI 27.86 kg/m  Wt Readings from Last 3 Encounters:  04/17/22 217 lb (98.4 kg)  04/11/22 217 lb 1.6 oz (98.5 kg)  03/31/22 216 lb (98 kg)   His wife is present for the entire visit. General: Well-appearing, very pleasant as always Eyes: sclera anicteric, no redness ENT: oral mucosa moist without lesions, no cervical or supraclavicular lymphadenopathy CV: Regular without appreciable murmur, no JVD, no peripheral edema Resp: clear to auscultation bilaterally, normal RR and effort noted GI: soft, no tenderness, with active bowel sounds. No guarding or palpable organomegaly noted. Skin; warm and dry, no rash or jaundice noted Neuro: awake, alert and oriented x 3. Normal gross motor function and fluent speech  Labs:     Latest Ref Rng & Units 04/10/2022    7:50 AM 12/27/2021    9:46 AM 12/18/2021    4:53 PM  CBC  WBC 4.0 - 10.5 K/uL 4.7  5.3  9.6   Hemoglobin 13.0 - 17.0 g/dL 13.8  13.1  12.4   Hematocrit 39.0 - 52.0 % 40.5  39.7  37.7   Platelets 150.0 - 400.0 K/uL 154.0  134.0  145    Hemoglobin 01 May 2021  Iron/TIBC/Ferritin/ %Sat    Component Value Date/Time   IRON 63 04/10/2022 0750   TIBC 392 04/10/2022 0750   FERRITIN 14 (L) 04/10/2022 0750   IRONPCTSAT 16 (L) 04/10/2022 0750   FOBT - October 2023  Iron studies similar in Oct 2023   Assessment: Encounter Diagnoses  Name Primary?   Other iron  deficiency anemia Yes   Gastroesophageal reflux disease, unspecified whether esophagitis present     Persistently low ferritin, briefly had a mild drop in hemoglobin just below normal, and has since normalized.  No localizing digestive symptoms, and in fact had negative FOBT.  Considering that and colonoscopy just 2 years ago, he does not have appear likely to have a source of GI blood loss contributing to his iron deficiency.  There may have been some  insufficient dietary intake with some changes he may have in the last few years, and perhaps he has some degree of iron malabsorption that may be nonspecific, otherwise asymptomatic celiac sprue, due to PPI. Iron levels have not improved on about 6 weeks of his most recent iron supplement, but he has cut the PPI back to once daily.  Longstanding reflux symptoms requiring daily PPI, so at least in that regard he should be considered for endoscopic evaluation to rule out Barrett's and therefore help determine his ability to go back on Celebrex, wean down PPI and possibly de-escalate to H2 blocker therapy.  Plan:  He was agreeable to an upper endoscopy after a discussion of procedure and risks.  The benefits and risks of the planned procedure were described in detail with the patient or (when appropriate) their health care proxy.  Risks were outlined as including, but not limited to, bleeding, infection, perforation, adverse medication reaction leading to cardiac or pulmonary decompensation, pancreatitis (if ERCP).  The limitation of incomplete mucosal visualization was also discussed.  No guarantees or warranties were given.  If that evaluation is unrevealing, and if ferritin not improving on oral iron, recommend primary care consider a dose of IV iron and/or referral to hematology.  Thank you for the courtesy of this consult.  Please call me with any questions or concerns.  Nelida Meuse III  CC: Referring provider noted above

## 2022-04-18 ENCOUNTER — Encounter: Payer: Self-pay | Admitting: Gastroenterology

## 2022-04-18 ENCOUNTER — Encounter: Payer: Self-pay | Admitting: Family Medicine

## 2022-04-21 ENCOUNTER — Other Ambulatory Visit: Payer: Self-pay | Admitting: Family Medicine

## 2022-04-22 ENCOUNTER — Encounter: Payer: Self-pay | Admitting: Family Medicine

## 2022-04-22 ENCOUNTER — Encounter: Payer: Self-pay | Admitting: Gastroenterology

## 2022-04-23 ENCOUNTER — Encounter: Payer: Self-pay | Admitting: Gastroenterology

## 2022-04-27 ENCOUNTER — Other Ambulatory Visit: Payer: Self-pay | Admitting: Family Medicine

## 2022-05-02 ENCOUNTER — Encounter: Payer: Self-pay | Admitting: Family Medicine

## 2022-05-02 ENCOUNTER — Encounter: Payer: Medicare Other | Admitting: Family Medicine

## 2022-05-02 ENCOUNTER — Ambulatory Visit (INDEPENDENT_AMBULATORY_CARE_PROVIDER_SITE_OTHER): Payer: Medicare Other | Admitting: Family Medicine

## 2022-05-02 VITALS — BP 110/70 | HR 80 | Temp 98.2°F | Ht 74.02 in | Wt 216.3 lb

## 2022-05-02 DIAGNOSIS — E1165 Type 2 diabetes mellitus with hyperglycemia: Secondary | ICD-10-CM | POA: Diagnosis not present

## 2022-05-02 DIAGNOSIS — D649 Anemia, unspecified: Secondary | ICD-10-CM

## 2022-05-02 DIAGNOSIS — E785 Hyperlipidemia, unspecified: Secondary | ICD-10-CM

## 2022-05-02 DIAGNOSIS — I10 Essential (primary) hypertension: Secondary | ICD-10-CM

## 2022-05-02 LAB — CBC WITH DIFFERENTIAL/PLATELET
Basophils Absolute: 0 10*3/uL (ref 0.0–0.1)
Basophils Relative: 0.9 % (ref 0.0–3.0)
Eosinophils Absolute: 0.1 10*3/uL (ref 0.0–0.7)
Eosinophils Relative: 2 % (ref 0.0–5.0)
HCT: 40.7 % (ref 39.0–52.0)
Hemoglobin: 13.7 g/dL (ref 13.0–17.0)
Lymphocytes Relative: 29.3 % (ref 12.0–46.0)
Lymphs Abs: 1.5 10*3/uL (ref 0.7–4.0)
MCHC: 33.7 g/dL (ref 30.0–36.0)
MCV: 93 fl (ref 78.0–100.0)
Monocytes Absolute: 0.5 10*3/uL (ref 0.1–1.0)
Monocytes Relative: 9.1 % (ref 3.0–12.0)
Neutro Abs: 3.1 10*3/uL (ref 1.4–7.7)
Neutrophils Relative %: 58.7 % (ref 43.0–77.0)
Platelets: 149 10*3/uL — ABNORMAL LOW (ref 150.0–400.0)
RBC: 4.38 Mil/uL (ref 4.22–5.81)
RDW: 12.7 % (ref 11.5–15.5)
WBC: 5.2 10*3/uL (ref 4.0–10.5)

## 2022-05-02 LAB — LIPID PANEL
Cholesterol: 131 mg/dL (ref 0–200)
HDL: 41.2 mg/dL (ref >39.00)
LDL Cholesterol: 66 mg/dL (ref 0–99)
NonHDL: 89.43
Total CHOL/HDL Ratio: 3
Triglycerides: 116 mg/dL (ref 0.0–149.0)
VLDL: 23.2 mg/dL (ref 0.0–40.0)

## 2022-05-02 LAB — HEPATIC FUNCTION PANEL
ALT: 16 U/L (ref 0–53)
AST: 17 U/L (ref 0–37)
Albumin: 4.2 g/dL (ref 3.5–5.2)
Alkaline Phosphatase: 49 U/L (ref 39–117)
Bilirubin, Direct: 0.1 mg/dL (ref 0.0–0.3)
Total Bilirubin: 0.6 mg/dL (ref 0.2–1.2)
Total Protein: 6.7 g/dL (ref 6.0–8.3)

## 2022-05-02 LAB — MICROALBUMIN / CREATININE URINE RATIO
Creatinine,U: 191.4 mg/dL
Microalb Creat Ratio: 2.6 mg/g (ref 0.0–30.0)
Microalb, Ur: 4.9 mg/dL — ABNORMAL HIGH (ref 0.0–1.9)

## 2022-05-02 LAB — HEMOGLOBIN A1C: Hgb A1c MFr Bld: 6.8 % — ABNORMAL HIGH (ref 4.6–6.5)

## 2022-05-02 NOTE — Progress Notes (Signed)
Established Patient Office Visit  Subjective   Patient ID: Peter Snyder, male    DOB: 14-Sep-1955  Age: 67 y.o. MRN: [id number]  Chief Complaint  Patient presents with   Annual Exam    HPI   Edu was initially scheduled for a "physical".  However, he has Medicare primary and had Medicare wellness visit already within the past year last March.  We basically converted this into a medical follow-up to address multiple issues as below  We did review health maintenance.  He had RSV vaccine.  Other vaccines up-to-date.  Colonoscopy up-to-date.  He has been scheduled for EGD to evaluate iron deficiency.  He had a couple Hemoccults which have been negative.  His hemoglobin has been normal but he had a low ferritin in spite of some iron replacement.  No longer taking nonsteroidals.  No melena.  No hematemesis.  His chronic problems include history of CAD, hypertension, type 2 diabetes, degenerative arthritis, ischemic cardiomyopathy, BPH, chronic insomnia, dyslipidemia, recurrent depression.  Generally doing well at this time.  He had some recent foot problems including slow to heal foot ulcer but this is finally healed.  He has resumed some regular exercise.  His diabetes has consistently been fairly well-controlled.  Last A1c was 6.5%.  He is due for follow-up.  Needs to set up diabetic eye exam soon.  Due for follow-up lipids.  On high-dose statin and Zetia.  Has recently had some slightly low blood pressures occasional systolics 0000000 but not consistently low.  No recent chest pain.  Past Medical History:  Diagnosis Date   Allergy    Allergy to sulfa drugs 12/10/2020   Arthritis    CAD (coronary artery disease) 6/14   Cardiac catheterization 6/27/ 2014 ejection fraction 35-40%, 30% proximal left circumflex, 100% tiny obtuse marginal 1 with collaterals, 50% LAD, 50% D1, 100% RCA with collaterals   Chicken pox    Chronic back pain    Colon polyps    Depression    Diabetes mellitus ([place])     Diet controlled- OFF metformin 02-01-20   E coli infection 0000000   Folliculitis 0000000   GERD (gastroesophageal reflux disease)    Hyperlipidemia    Hypertension    Kidney stone    Myocardial infarction [facility])    ? year- cath 2014/ june    Past Surgical History:  Procedure Laterality Date   COLONOSCOPY     CRYO INTERCOSTAL NERVE BLOCK     double sacrum nerve block   epidural injections     multiple- L5 S1 and cervical neck pain issues as well    POLYPECTOMY     [place]  2019    reports that he quit smoking about 24 years ago. His smoking use included cigarettes. He started smoking about 52 years ago. He has a 7.00 pack-year smoking history. He has never used smokeless tobacco. He reports that he does not drink alcohol and does not use drugs. family history includes Arthritis in his father and paternal grandmother; COPD in his father; Cancer in his mother; Clotting disorder in his paternal grandfather and paternal grandmother; Colon polyps in his father; Heart disease in his maternal grandmother; Hyperlipidemia in his maternal grandmother; Hypertension in his mother; Liver cancer in his paternal grandfather; Melanoma in his mother. Allergies  Allergen Reactions   Doxycycline Hyclate Nausea And Vomiting   Latex Other (See Comments)    Other   Tape     Use Paper  Tape    Sulfa Antibiotics Rash     Review of Systems  Constitutional:  Negative for malaise/fatigue.  Eyes:  Negative for blurred vision.  Respiratory:  Negative for shortness of breath.   Cardiovascular:  Negative for chest pain.  Neurological:  Negative for weakness and headaches.      Objective:     BP 110/70 (BP Location: Left Arm, Patient Position: Sitting, Cuff Size: Normal)   Pulse 80   Temp 98.2 F (36.8 C) (Oral)   Ht 6' 2.02" (1.88 m)   Wt 216 lb 4.8 oz (98.1 kg)   SpO2 98%   BMI 27.76 kg/m    Physical Exam Vitals reviewed.  Constitutional:       Appearance: He is well-developed.  HENT:     Right Ear: Tympanic membrane and external ear normal.     Left Ear: Tympanic membrane and external ear normal.  Eyes:     Pupils: Pupils are equal, round, and reactive to light.  Neck:     Thyroid: No thyromegaly.  Cardiovascular:     Rate and Rhythm: Normal rate and regular rhythm.  Pulmonary:     Effort: Pulmonary effort is normal. No respiratory distress.     Breath sounds: Normal breath sounds. No wheezing or rales.  Musculoskeletal:     Cervical back: Neck supple.     Right lower leg: No edema.     Left lower leg: No edema.  Neurological:     Mental Status: He is alert and oriented to person, place, and time.      No results found for any visits on 05/02/22.    The 10-year ASCVD risk score (Arnett DK, et al., 2019) is: 17.6%    Assessment & Plan:   #1 type 2 diabetes.  History of good control.  Currently not treated with medication.  Recheck A1c today and also urine microalbumin screen.  Set up diabetic eye exam.  Continue low glycemic diet and regular exercise  #2 dyslipidemia.  History of CAD.  Recheck lipid panel.  He had recent liver panel and chemistries through foot doctor and these were normal.  Continue high-dose statin along with Setia  #3 hypertension well-controlled.  Watch for orthostasis.  Continue carvedilol and Entresto per cardiology   No follow-ups on file.    Carolann Littler, MD

## 2022-05-05 ENCOUNTER — Other Ambulatory Visit: Payer: Self-pay | Admitting: *Deleted

## 2022-05-05 DIAGNOSIS — I7121 Aneurysm of the ascending aorta, without rupture: Secondary | ICD-10-CM

## 2022-05-07 ENCOUNTER — Encounter: Payer: Self-pay | Admitting: Gastroenterology

## 2022-05-08 ENCOUNTER — Encounter: Payer: Self-pay | Admitting: Certified Registered Nurse Anesthetist

## 2022-05-09 ENCOUNTER — Other Ambulatory Visit: Payer: Self-pay | Admitting: Gastroenterology

## 2022-05-09 ENCOUNTER — Encounter: Payer: Self-pay | Admitting: Family Medicine

## 2022-05-09 ENCOUNTER — Ambulatory Visit: Payer: Medicare Other | Admitting: Gastroenterology

## 2022-05-09 ENCOUNTER — Encounter: Payer: Self-pay | Admitting: Gastroenterology

## 2022-05-09 VITALS — BP 109/65 | HR 63 | Temp 98.0°F | Resp 14 | Ht 74.0 in | Wt 217.0 lb

## 2022-05-09 DIAGNOSIS — K219 Gastro-esophageal reflux disease without esophagitis: Secondary | ICD-10-CM

## 2022-05-09 DIAGNOSIS — K317 Polyp of stomach and duodenum: Secondary | ICD-10-CM | POA: Diagnosis not present

## 2022-05-09 DIAGNOSIS — D508 Other iron deficiency anemias: Secondary | ICD-10-CM

## 2022-05-09 MED ORDER — FAMOTIDINE 40 MG PO TABS: 40.0000 mg | ORAL_TABLET | Freq: Every day | ORAL | 3 refills | Status: DC

## 2022-05-09 MED ORDER — SODIUM CHLORIDE 0.9 % IV SOLN: 500.0000 mL | Freq: Once | INTRAVENOUS | Status: DC

## 2022-05-09 NOTE — Progress Notes (Signed)
1013 Robinul 0.1 mg IV given due large amount of secretions upon assessment.  MD made aware, vss  ?

## 2022-05-09 NOTE — Patient Instructions (Signed)
Resume previous diet and medications. Follow up with PCP as scheduled.  YOU HAD AN ENDOSCOPIC PROCEDURE TODAY AT [facility]:   Refer to the procedure report that was given to you for any specific questions about what was found during the examination.  If the procedure report does not answer your questions, please call your gastroenterologist to clarify.  If you requested that your care partner not be given the details of your procedure findings, then the procedure report has been included in a sealed envelope for you to review at your convenience later.  YOU SHOULD EXPECT: Some feelings of bloating in the abdomen. Passage of more gas than usual.  Walking can help get rid of the air that was put into your GI tract during the procedure and reduce the bloating. If you had a lower endoscopy (such as a colonoscopy or flexible sigmoidoscopy) you may notice spotting of blood in your stool or on the toilet paper. If you underwent a bowel prep for your procedure, you may not have a normal bowel movement for a few days.  Please Note:  You might notice some irritation and congestion in your nose or some drainage.  This is from the oxygen used during your procedure.  There is no need for concern and it should clear up in a day or so.  SYMPTOMS TO REPORT IMMEDIATELY:  Following upper endoscopy (EGD)  Vomiting of blood or coffee ground material  New chest pain or pain under the shoulder blades  Painful or persistently difficult swallowing  New shortness of breath  Fever of 100F or higher  Black, tarry-looking stools  For urgent or emergent issues, a gastroenterologist can be reached at any hour by calling [phone]. Do not use MyChart messaging for urgent concerns.    DIET:  We do recommend a small meal at first, but then you may proceed to your regular diet.  Drink plenty of fluids but you should avoid alcoholic beverages for 24 hours.  ACTIVITY:  You should plan to take it easy  for the rest of today and you should NOT DRIVE or use heavy machinery until tomorrow (because of the sedation medicines used during the test).    FOLLOW UP: Our staff will call the number listed on your records the next business day following your procedure.  We will call around 7:15- 8:00 am to check on you and address any questions or concerns that you may have regarding the information given to you following your procedure. If we do not reach you, we will leave a message.     If any biopsies were taken you will be contacted by phone or by letter within the next 1-3 weeks.  Please call us at [phone] if you have not heard about the biopsies in 3 weeks.    SIGNATURES/CONFIDENTIALITY: You and/or your care partner have signed paperwork which will be entered into your electronic medical record.  These signatures attest to the fact that that the information above on your After Visit Summary has been reviewed and is understood.  Full responsibility of the confidentiality of this discharge information lies with you and/or your care-partner.

## 2022-05-09 NOTE — Progress Notes (Signed)
No changes to clinical history since GI office visit on 04/17/22.  The patient is appropriate for an endoscopic procedure in the ambulatory setting.  - Wilfrid Lund, MD

## 2022-05-09 NOTE — Progress Notes (Signed)
Report given to PACU, vss 

## 2022-05-09 NOTE — Op Note (Signed)
[place] Patient Name: [facility] Procedure Date: 05/09/2022 9:16 AM MRN: [id number] Endoscopist: Mallie Mussel L. Loletha Carrow , MD, [id number] Age: 67 Referring MD:  Date of Birth: 09-02-1955 Gender: Male Account #: [id number] Procedure:                Upper GI endoscopy Indications:              Unexplained iron deficiency anemia (heme-negative),                            Gastro-esophageal reflux disease, Screening for                            Barrett's esophagus                           Clinical details and recent office consult note Medicines:                Monitored Anesthesia Care Procedure:                Pre-Anesthesia Assessment:                           - Prior to the procedure, a History and Physical                            was performed, and patient medications and                            allergies were reviewed. The patient's tolerance of                            previous anesthesia was also reviewed. The risks                            and benefits of the procedure and the sedation                            options and risks were discussed with the patient.                            All questions were answered, and informed consent                            was obtained. Prior Anticoagulants: The patient has                            taken no anticoagulant or antiplatelet agents. ASA                            Grade Assessment: III - A patient with severe                            systemic disease. After reviewing the risks and  benefits, the patient was deemed in satisfactory                            condition to undergo the procedure.                           After obtaining informed consent, the endoscope was                            passed under direct vision. Throughout the                            procedure, the patient's blood pressure, pulse, and                            oxygen saturations were monitored  continuously. The                            GIF [id number] was introduced through the                            mouth, and advanced to the second part of duodenum.                            The upper GI endoscopy was accomplished without                            difficulty. The patient tolerated the procedure                            well. Scope In: Scope Out: Findings:                 The larynx was normal.                           The esophagus was normal.                           A few small sessile fundic gland polyps were found                            in the gastric body. (Examined under WL and NBI)                           The exam of the stomach was otherwise normal.                           The examined duodenum was normal. Complications:            No immediate complications. Estimated Blood Loss:     Estimated blood loss: none. Impression:               - Normal larynx.                           -  Normal esophagus.                           - A few fundic gland polyps.                           - Normal examined duodenum.                           - No specimens collected.                           Probable nonspecific decreased iron absorption,                            perhaps related to PPI use. Recommendation:           - Patient has a contact number available for                            emergencies. The signs and symptoms of potential                            delayed complications were discussed with the                            patient. Return to normal activities tomorrow.                            Written discharge instructions were provided to the                            patient.                           - Resume previous diet.                           - Continue present medications.                           - Regular follow-up with PCP as scheduled for                            treatment and monitoring of anemia. Jimmy Plessinger L. Loletha Carrow,  MD 05/09/2022 10:33:40 AM This report has been signed electronically.

## 2022-05-11 ENCOUNTER — Encounter: Payer: Self-pay | Admitting: Gastroenterology

## 2022-05-12 ENCOUNTER — Telehealth: Payer: Self-pay | Admitting: *Deleted

## 2022-05-12 ENCOUNTER — Other Ambulatory Visit: Payer: Self-pay

## 2022-05-12 ENCOUNTER — Other Ambulatory Visit: Payer: Self-pay | Admitting: Family Medicine

## 2022-05-12 MED ORDER — FAMOTIDINE 40 MG PO TABS: 40.0000 mg | ORAL_TABLET | Freq: Every day | ORAL | 3 refills | Status: DC

## 2022-05-12 NOTE — Telephone Encounter (Signed)
Left message on f/u call 

## 2022-05-13 ENCOUNTER — Encounter: Payer: Self-pay | Admitting: Gastroenterology

## 2022-05-16 ENCOUNTER — Ambulatory Visit (HOSPITAL_COMMUNITY)
Admission: EM | Admit: 2022-05-16 | Discharge: 2022-05-16 | Disposition: A | Discharge status: Home / Self Care | Payer: Medicare Other

## 2022-05-16 ENCOUNTER — Encounter: Payer: Self-pay | Admitting: Gastroenterology

## 2022-05-16 ENCOUNTER — Encounter (HOSPITAL_COMMUNITY): Payer: Self-pay

## 2022-05-16 DIAGNOSIS — S29012A Strain of muscle and tendon of back wall of thorax, initial encounter: Secondary | ICD-10-CM | POA: Diagnosis not present

## 2022-05-16 LAB — POCT URINALYSIS DIPSTICK, ED / UC
Bilirubin Urine: NEGATIVE
Glucose, UA: NEGATIVE mg/dL
Ketones, ur: NEGATIVE mg/dL
Leukocytes,Ua: NEGATIVE
Nitrite: NEGATIVE
Protein, ur: NEGATIVE mg/dL
Specific Gravity, Urine: 1.02 (ref 1.005–1.030)
Urobilinogen, UA: 0.2 mg/dL (ref 0.0–1.0)
pH: 5.5 (ref 5.0–8.0)

## 2022-05-16 MED ORDER — METHOCARBAMOL 500 MG PO TABS: 500.0000 mg | ORAL_TABLET | Freq: Two times a day (BID) | ORAL | 0 refills | Status: DC | PRN

## 2022-05-16 NOTE — ED Triage Notes (Signed)
Pt presents to the office for lower back pain that started yesterday. Pt states he exercise daily.

## 2022-05-16 NOTE — ED Provider Notes (Signed)
[place]    CSN: [id number] Arrival date & time: 05/16/22  1603      History   Chief Complaint Chief Complaint  Patient presents with   Back Pain    HPI Azeez Seck is a 67 y.o. male.   Patient reports right sided thoracis / lower back pain since yesterday after the gym. He had done the bicycle and the treadmill. He occasionally does light lighting. Has a hx of arthritis, does work out and is active, feels as if this is a musculoskeletal strain. Denies numbness, tingling or radiation. Pain is reproducible with movement.   Had an endoscopy done last week, but has not had any complications with this, after his endoscopy came back normal he was restarted on his Celebrex. Denies cough, SOB or wheezing. Denies recent illness.  Denies dysuria.  Denies fevers.  The history is provided by the patient.  Back Pain Associated symptoms: no abdominal pain, no chest pain, no dysuria and no fever     Past Medical History:  Diagnosis Date   Allergy    Allergy to sulfa drugs 12/10/2020   Arthritis    CAD (coronary artery disease) 6/14   Cardiac catheterization 6/27/ 2014 ejection fraction 35-40%, 30% proximal left circumflex, 100% tiny obtuse marginal 1 with collaterals, 50% LAD, 50% D1, 100% RCA with collaterals   Chicken pox    Chronic back pain    Colon polyps    Depression    Diabetes mellitus ([place])    Diet controlled- OFF metformin 02-01-20   E coli infection 0000000   Folliculitis 0000000   GERD (gastroesophageal reflux disease)    Hyperlipidemia    Hypertension    Kidney stone    Myocardial infarction [facility])    ? year- cath 2014/ june     Patient Active Problem List   Diagnosis Date Noted   Folliculitis 99991111   Allergy to sulfa drugs 12/10/2020   E coli infection 12/10/2020   Allergic rhinitis 08/03/2020   Type 2 diabetes mellitus with hyperglycemia ([place]) 07/11/2019   Pes anserinus bursitis of left knee 05/20/2018   Pain in left knee 02/26/2018    DDD (degenerative disc disease), cervical 06/02/2017   Degeneration of lumbar intervertebral disc 05/25/2017   Acquired trigger finger of left middle finger 05/08/2017   Pain in finger of left hand 05/08/2017   Trigger finger 03/28/2017   Viral URI 03/16/2015   Gastroenteritis 04/03/2014   Chronic insomnia 03/14/2014   Peripheral neuropathy 09/02/2013   Cardiomyopathy, ischemic 01/18/2013   CAD (coronary artery disease) 12/29/2012   GERD (gastroesophageal reflux disease) 12/29/2012   Essential hypertension, benign 12/29/2012   Dyslipidemia 12/29/2012   Personal history of colonic polyps 12/29/2012   Kidney stones 12/29/2012   Depression 12/29/2012   Degenerative arthritis of lumbar spine 12/29/2012   BPH (benign prostatic hyperplasia) 12/29/2012   Essential tremor 12/29/2012    Past Surgical History:  Procedure Laterality Date   COLONOSCOPY     CRYO INTERCOSTAL NERVE BLOCK     double sacrum nerve block   epidural injections     multiple- L5 S1 and cervical neck pain issues as well    POLYPECTOMY     [place]  2019       Home Medications    Prior to Admission medications   Medication Sig Start Date End Date Taking? Authorizing Provider  acetaminophen (TYLENOL) 500 MG tablet Take 500 mg by mouth every 6 (six) hours as  needed for moderate pain.   Yes [provider]  ALPRAZolam (XANAX) 0.25 MG tablet Take 0.25 mg by mouth daily as needed for anxiety.   Yes [provider]  aspirin 81 MG tablet Take 81 mg by mouth daily.   Yes [provider]  atorvastatin (LIPITOR) 80 MG tablet TAKE 1 TABLET BY MOUTH EVERY DAY 01/24/22  Yes Burchette, Alinda Sierras, MD  Azelastine HCl 137 MCG/SPRAY SOLN PLACE 1 SPRAY INTO BOTH NOSTRILS 2 TIMES DAILY AS DIRECTED 01/24/22  Yes Burchette, Alinda Sierras, MD  carvedilol (COREG) 12.5 MG tablet TAKE 1 TABLET TWICE A DAY WITH A MEAL 01/21/22  Yes Burchette, Alinda Sierras, MD  celecoxib (CELEBREX) 200 MG  capsule Take by mouth. 06/23/16  Yes [provider]  diclofenac Sodium (VOLTAREN) 1 % GEL Apply 2 g topically daily as needed (for pain).   Yes [provider]  ENTRESTO 97-103 MG TAKE 1 TABLET BY MOUTH TWICE A DAY 01/24/22  Yes Lelon Perla, MD  escitalopram (LEXAPRO) 20 MG tablet Take 20 mg by mouth at bedtime.   Yes [provider]  ezetimibe (ZETIA) 10 MG tablet TAKE 1 TABLET BY MOUTH EVERY DAY 10/18/21  Yes Burchette, Alinda Sierras, MD  famotidine (PEPCID) 40 MG tablet Take 1 tablet (40 mg total) by mouth daily. 05/12/22  Yes Danis, Kirke Corin, MD  gabapentin (NEURONTIN) 600 MG tablet TAKE 1 TABLET BY MOUTH TWICE A DAY 03/03/22  Yes Burchette, Alinda Sierras, MD  LOTEMAX 0.5 % GEL SMARTSIG:1 Drop(s) In Eye(s) Twice Daily PRN 12/06/18  Yes [provider]  methocarbamol (ROBAXIN) 500 MG tablet Take 1 tablet (500 mg total) by mouth 2 (two) times daily as needed for muscle spasms. 05/16/22  Yes Louretta Shorten, [place] N, FNP  pantoprazole (PROTONIX) 40 MG tablet TAKE 1 TABLET BY MOUTH TWICE A DAY 01/24/22  Yes Burchette, Alinda Sierras, MD  RESTASIS 0.05 % ophthalmic emulsion Place 1 drop into both eyes 2 (two) times daily.  11/20/12  Yes [provider]  tamsulosin (FLOMAX) 0.4 MG CAPS capsule TAKE 1 CAPSULE EVERY DAY AFTER SUPPER 05/12/22  Yes Burchette, Alinda Sierras, MD  ULTRAM 50 MG tablet Take 50 mg by mouth 3 (three) times daily as needed. 03/24/22  Yes [provider]  zolpidem (AMBIEN) 10 MG tablet Take 10 mg by mouth at bedtime as needed for sleep.   Yes [provider]    Family History Family History  Problem Relation Age of Onset   Melanoma Mother        metastatic   Cancer Mother        melanoma   Hypertension Mother    Arthritis Father    Colon polyps Father    COPD Father    Hyperlipidemia Maternal Grandmother    Heart disease Maternal Grandmother    Arthritis Paternal Grandmother    Clotting disorder Paternal Grandmother    Clotting disorder  Paternal Grandfather    Liver cancer Paternal Grandfather    Colon cancer Neg Hx    Esophageal cancer Neg Hx    Rectal cancer Neg Hx    Stomach cancer Neg Hx     Social History Social History   Tobacco Use   Smoking status: Former    Packs/day: 0.25    Years: 28.00    Total pack years: 7.00    Types: Cigarettes    Start date: 68    Quit date: 2000    Years since quitting: 24.1   Smokeless  tobacco: Never  Vaping Use   Vaping Use: Never used  Substance Use Topics   Alcohol use: No    Alcohol/week: 0.0 standard drinks of alcohol   Drug use: No     Allergies   Doxycycline hyclate, Latex, Tape, and Sulfa antibiotics   Review of Systems Review of Systems  Constitutional:  Negative for chills and fever.  HENT:  Negative for ear pain and sore throat.   Eyes:  Negative for pain and visual disturbance.  Respiratory:  Negative for cough and shortness of breath.   Cardiovascular:  Negative for chest pain and palpitations.  Gastrointestinal:  Negative for abdominal pain and vomiting.  Genitourinary:  Negative for dysuria and hematuria.  Musculoskeletal:  Positive for back pain. Negative for arthralgias, gait problem and joint swelling.  Skin:  Negative for color change and rash.  Neurological:  Negative for seizures and syncope.  All other systems reviewed and are negative.    Physical Exam Triage Vital Signs ED Triage Vitals [05/16/22 1650]  Enc Vitals Group     BP 118/73     Pulse Rate 88     Resp 16     Temp 98.9 F (37.2 C)     Temp Source Oral     SpO2 98 %     Weight      Height      Head Circumference      Peak Flow      Pain Score      Pain Loc      Pain Edu?      Excl. in [place]?    No data found.  Updated Vital Signs BP 118/73 (BP Location: Left Arm)   Pulse 88   Temp 98.9 F (37.2 C) (Oral)   Resp 16   SpO2 98%   Visual Acuity Right Eye Distance:   Left Eye Distance:   Bilateral Distance:    Right Eye Near:   Left Eye Near:     Bilateral Near:     Physical Exam Vitals and nursing note reviewed.  Constitutional:      General: He is not in acute distress.    Appearance: Normal appearance. He is well-developed.     Comments: Pleasant 67 year old male who appears stated age.  HENT:     Head: Normocephalic and atraumatic.     Right Ear: External ear normal.     Left Ear: External ear normal.     Nose: Nose normal.     Mouth/Throat:     Mouth: Mucous membranes are moist.     Pharynx: Posterior oropharyngeal erythema present.  Eyes:     Conjunctiva/sclera: Conjunctivae normal.  Cardiovascular:     Rate and Rhythm: Normal rate and regular rhythm.     Heart sounds: S1 normal and S2 normal. No murmur heard. Pulmonary:     Effort: Pulmonary effort is normal. No respiratory distress.     Breath sounds: Normal breath sounds.     Comments: Lungs vesicular posteriorly. Abdominal:     Tenderness: There is no right CVA tenderness or left CVA tenderness.  Musculoskeletal:        General: No swelling, tenderness, deformity or signs of injury. Normal range of motion.     Cervical back: Normal range of motion and neck supple. No rigidity or tenderness.  Lymphadenopathy:     Cervical: No cervical adenopathy.  Skin:    General: Skin is warm and dry.     Capillary Refill: Capillary refill takes less  than 2 seconds.  Neurological:     Mental Status: He is alert.  Psychiatric:        Mood and Affect: Mood normal.        Behavior: Behavior is cooperative.      UC Treatments / Results  Labs (all labs ordered are listed, but only abnormal results are displayed) Labs Reviewed  POCT URINALYSIS DIPSTICK, ED / UC - Abnormal; Notable for the following components:      Result Value   Hgb urine dipstick SMALL (*)    All other components within normal limits    EKG   Radiology No results found.  Procedures Procedures (including critical care time)  Medications Ordered in UC Medications - No data to  display  Initial Impression / Assessment and Plan / UC Course  I have reviewed the triage vital signs and the nursing notes.  Pertinent labs & imaging results that were available during my care of the patient were reviewed by me and considered in my medical decision making (see chart for details).  Vitals and triage note reviewed, patient is hemodynamically stable.  Lungs vesicular posteriorly, without CVA tenderness or dysuria.  Without red flag symptoms of saddle anesthesia, incontinence, numbness, tingling.  Urine dipstick with small hemoglobin, negative for protein, glucose, ketones, nitrites or leukocytes. Negative for UTI. Symptoms are consistent with a thoracic musculoskeletal strain, advised symptomatic treatment with Tylenol, pt already on Celebrex, warm baths w/ epsom salt and muscle realxers if pain persists.  Follow-up care and return precautions discussed, patient verbalized understanding.     Final Clinical Impressions(s) / UC Diagnoses   Final diagnoses:  Strain of thoracic back region     Discharge Instructions      Your symptoms are consistent with a thoracic back strain.  Please take Tylenol as needed for pain, you can do gentle stretching, warm baths with Epsom salt, heating pad, and lidocaine patches as needed.  If your pain persist, you can take the Robaxin up to 2 times daily for muscle spasms, this does cause drowsiness, so please do not drink or drive on this medication.  If your pain persist, please follow-up with your primary care provider, or [place] [facility].  Please seek immediate care if you develop numbness, tingling, worsening pain or changes in symptoms.     ED Prescriptions     Medication Sig Dispense Auth. Provider   methocarbamol (ROBAXIN) 500 MG tablet Take 1 tablet (500 mg total) by mouth 2 (two) times daily as needed for muscle spasms. 20 tablet Marinell Igarashi, [place] N, [place]      I have reviewed the PDMP during this encounter.    Zylan Almquist, [place] N, [place] 05/16/22 1728

## 2022-05-16 NOTE — Discharge Instructions (Addendum)
Your symptoms are consistent with a thoracic back strain.  Please take Tylenol as needed for pain, you can do gentle stretching, warm baths with Epsom salt, heating pad, and lidocaine patches as needed.  If your pain persist, you can take the Robaxin up to 2 times daily for muscle spasms, this does cause drowsiness, so please do not drink or drive on this medication.  If your pain persist, please follow-up with your primary care provider, or [place] [facility].  Please seek immediate care if you develop numbness, tingling, worsening pain or changes in symptoms.

## 2022-05-19 ENCOUNTER — Encounter: Payer: Self-pay | Admitting: Family Medicine

## 2022-05-19 ENCOUNTER — Telehealth: Payer: Self-pay | Admitting: Family Medicine

## 2022-05-19 ENCOUNTER — Ambulatory Visit (INDEPENDENT_AMBULATORY_CARE_PROVIDER_SITE_OTHER): Payer: Medicare Other | Admitting: Family Medicine

## 2022-05-19 VITALS — BP 124/66 | HR 65 | Temp 97.8°F | Ht 74.0 in | Wt 217.6 lb

## 2022-05-19 DIAGNOSIS — R319 Hematuria, unspecified: Secondary | ICD-10-CM | POA: Diagnosis not present

## 2022-05-19 DIAGNOSIS — M546 Pain in thoracic spine: Secondary | ICD-10-CM

## 2022-05-19 LAB — POCT URINALYSIS DIPSTICK
Bilirubin, UA: POSITIVE
Blood, UA: NEGATIVE
Glucose, UA: NEGATIVE
Ketones, UA: NEGATIVE
Leukocytes, UA: NEGATIVE
Nitrite, UA: NEGATIVE
Protein, UA: NEGATIVE
Spec Grav, UA: 1.015 (ref 1.010–1.025)
Urobilinogen, UA: 0.2 E.U./dL
pH, UA: 5.5 (ref 5.0–8.0)

## 2022-05-19 MED ORDER — PANTOPRAZOLE SODIUM 40 MG PO TBEC: 40.0000 mg | DELAYED_RELEASE_TABLET | Freq: Two times a day (BID) | ORAL | 3 refills | Status: DC

## 2022-05-19 NOTE — Progress Notes (Signed)
Established Patient Office Visit  Subjective   Patient ID: Peter Snyder, male    DOB: 1955-05-16  Age: 67 y.o. MRN: [id number]  No chief complaint on file.   HPI   Peter Snyder is seen for follow-up from recent urgent care visit.  He developed some right thoracic pain.  Denies any specific injury.  His pain did start after prolonged workout of about hour duration.  He had urine dipstick which showed small hemoglobin but no leukocytes or nitrites.  Was given reassurance.  He has not seen any gross hematuria.  No history of kidney stones.  No flank pain or abdominal pain.  No burning with urination.  He has longstanding history of reflux and recently had to go up with Protonix to 40 mg twice daily.  Needs refills.  He had recent EGD which was unremarkable which was done in the setting of low ferritin with normal hemoglobin.  Hemoccults have been negative.  Past Medical History:  Diagnosis Date   Allergy    Allergy to sulfa drugs 12/10/2020   Arthritis    CAD (coronary artery disease) 6/14   Cardiac catheterization 6/27/ 2014 ejection fraction 35-40%, 30% proximal left circumflex, 100% tiny obtuse marginal 1 with collaterals, 50% LAD, 50% D1, 100% RCA with collaterals   Chicken pox    Chronic back pain    Colon polyps    Depression    Diabetes mellitus ([place])    Diet controlled- OFF metformin 02-01-20   E coli infection 0000000   Folliculitis 0000000   GERD (gastroesophageal reflux disease)    Hyperlipidemia    Hypertension    Kidney stone    Myocardial infarction [facility])    ? year- cath 2014/ june    Past Surgical History:  Procedure Laterality Date   COLONOSCOPY     CRYO INTERCOSTAL NERVE BLOCK     double sacrum nerve block   epidural injections     multiple- L5 S1 and cervical neck pain issues as well    POLYPECTOMY     [place]  2019    reports that he quit smoking about 24 years ago. His smoking use included cigarettes. He started  smoking about 52 years ago. He has a 7.00 pack-year smoking history. He has never used smokeless tobacco. He reports that he does not drink alcohol and does not use drugs. family history includes Arthritis in his father and paternal grandmother; COPD in his father; Cancer in his mother; Clotting disorder in his paternal grandfather and paternal grandmother; Colon polyps in his father; Heart disease in his maternal grandmother; Hyperlipidemia in his maternal grandmother; Hypertension in his mother; Liver cancer in his paternal grandfather; Melanoma in his mother. Allergies  Allergen Reactions   Doxycycline Hyclate Nausea And Vomiting   Latex Other (See Comments)    Other   Tape     Use Paper Tape    Sulfa Antibiotics Rash    Review of Systems  Constitutional:  Negative for chills and fever.  Respiratory:  Negative for cough and shortness of breath.   Cardiovascular:  Negative for chest pain.  Gastrointestinal:  Negative for abdominal pain.  Genitourinary:  Negative for dysuria, frequency and hematuria.  Musculoskeletal:  Positive for back pain.      Objective:     BP 124/66 (BP Location: Left Arm, Patient Position: Sitting, Cuff Size: Normal)   Pulse 65   Temp 97.8 F (36.6 C) (Oral)   Ht '6\' 2"'$  (1.88  m)   Wt 217 lb 9.6 oz (98.7 kg)   SpO2 99%   BMI 27.94 kg/m  BP Readings from Last 3 Encounters:  05/19/22 124/66  05/16/22 118/73  05/09/22 109/65   Wt Readings from Last 3 Encounters:  05/19/22 217 lb 9.6 oz (98.7 kg)  05/09/22 217 lb (98.4 kg)  05/02/22 216 lb 4.8 oz (98.1 kg)      Physical Exam Vitals reviewed.  Constitutional:      General: He is not in acute distress.    Appearance: Normal appearance.  Cardiovascular:     Rate and Rhythm: Normal rate and regular rhythm.  Pulmonary:     Effort: Pulmonary effort is normal.     Breath sounds: Normal breath sounds. No wheezing or rales.  Abdominal:     Palpations: Abdomen is soft.     Tenderness: There is no  abdominal tenderness. There is no guarding or rebound.  Musculoskeletal:     Comments: No spinal tenderness in the thoracic region.  Right parathoracic region is palpated with no reproducible tenderness.  Neurological:     Mental Status: He is alert.      Results for orders placed or performed in visit on 05/19/22  POCT urinalysis dipstick  Result Value Ref Range   Color, UA Yellow    Clarity, UA Clear    Glucose, UA Negative Negative   Bilirubin, UA Positive    Ketones, UA Negative    Spec Grav, UA 1.015 1.010 - 1.025   Blood, UA Negative    pH, UA 5.5 5.0 - 8.0   Protein, UA Negative Negative   Urobilinogen, UA 0.2 0.2 or 1.0 E.U./dL   Nitrite, UA Negative    Leukocytes, UA Negative Negative   Appearance     Odor      Last CBC Lab Results  Component Value Date   WBC 5.2 05/02/2022   HGB 13.7 05/02/2022   HCT 40.7 05/02/2022   MCV 93.0 05/02/2022   MCH 30.6 12/18/2021   RDW 12.7 05/02/2022   PLT 149.0 (L) [id number]   Last metabolic panel Lab Results  Component Value Date   GLUCOSE 111 (H) 12/18/2021   NA 134 (L) 12/18/2021   K 4.0 12/18/2021   CL 101 12/18/2021   CO2 26 12/18/2021   BUN 17 12/18/2021   CREATININE 0.91 12/18/2021   GFRNONAA >60 12/18/2021   CALCIUM 9.0 12/18/2021   PROT 6.7 05/02/2022   ALBUMIN 4.2 05/02/2022   BILITOT 0.6 05/02/2022   ALKPHOS 49 05/02/2022   AST 17 05/02/2022   ALT 16 05/02/2022   ANIONGAP 7 12/18/2021   Last hemoglobin A1c Lab Results  Component Value Date   HGBA1C 6.8 (H) 05/02/2022   Last thyroid functions Lab Results  Component Value Date   TSH 1.62 03/30/2020      The 10-year ASCVD risk score (Arnett DK, et al., 2019) is: 25%    Assessment & Plan:   #1 probable musculoskeletal right thoracic pain.  Some improved at this time.  This did not sound likely renal in origin given location (superior to kidneys)  no further evaluation unless this recurs  #2 positive hemoglobin on recent urine dipstick.   Repeat today does not show any hemoglobin.  No further evaluation   No follow-ups on file.    Carolann Littler, MD

## 2022-05-19 NOTE — Telephone Encounter (Signed)
Called patient to schedule Medicare Annual Wellness Visit (AWV). Left message for patient to call back and schedule Medicare Annual Wellness Visit (AWV).  Last date of AWV: 06/03/21  Please schedule an appointment at any time with NHA beverly or Hannah kim.  If any questions, please contact me at [phone].  Thank you ,  Barkley Boards AWV direct phone # [phone]  Returned patients call   he stated he need to r/s his 06/05/22 AWV appointment

## 2022-05-21 ENCOUNTER — Encounter: Payer: Self-pay | Admitting: Family Medicine

## 2022-05-21 ENCOUNTER — Other Ambulatory Visit: Payer: Self-pay | Admitting: Family Medicine

## 2022-05-22 ENCOUNTER — Encounter: Payer: Self-pay | Admitting: Cardiology

## 2022-05-22 MED ORDER — SACUBITRIL-VALSARTAN 49-51 MG PO TABS: 1.0000 | ORAL_TABLET | Freq: Two times a day (BID) | ORAL | 3 refills | Status: DC

## 2022-05-23 ENCOUNTER — Encounter: Payer: Self-pay | Admitting: Family Medicine

## 2022-05-24 NOTE — Telephone Encounter (Signed)
Noted.  Peter Snyder Peter Louis MD [facility]  

## 2022-05-25 ENCOUNTER — Ambulatory Visit
Admission: RE | Admit: 2022-05-25 | Discharge: 2022-05-25 | Disposition: A | Discharge status: Home / Self Care | Payer: Medicare Other | Source: Ambulatory Visit | Attending: Cardiology | Admitting: Cardiology

## 2022-05-25 DIAGNOSIS — I7121 Aneurysm of the ascending aorta, without rupture: Secondary | ICD-10-CM

## 2022-05-25 MED ORDER — GADOPICLENOL 0.5 MMOL/ML IV SOLN
10.0000 mL | Freq: Once | INTRAVENOUS | Status: AC | PRN
  Administered 2022-05-25: 10 mL via INTRAVENOUS

## 2022-05-26 ENCOUNTER — Encounter: Payer: Self-pay | Admitting: Cardiology

## 2022-05-30 ENCOUNTER — Encounter: Payer: Self-pay | Admitting: Family Medicine

## 2022-06-01 ENCOUNTER — Other Ambulatory Visit: Payer: Self-pay | Admitting: Family Medicine

## 2022-06-01 DIAGNOSIS — J019 Acute sinusitis, unspecified: Secondary | ICD-10-CM

## 2022-06-03 ENCOUNTER — Encounter: Payer: Self-pay | Admitting: Family Medicine

## 2022-06-03 ENCOUNTER — Ambulatory Visit (INDEPENDENT_AMBULATORY_CARE_PROVIDER_SITE_OTHER): Payer: Medicare Other | Admitting: Family Medicine

## 2022-06-03 ENCOUNTER — Telehealth: Payer: Self-pay | Admitting: Family Medicine

## 2022-06-03 VITALS — BP 110/60 | HR 95 | Temp 98.4°F | Ht 74.0 in | Wt 215.6 lb

## 2022-06-03 DIAGNOSIS — R209 Unspecified disturbances of skin sensation: Secondary | ICD-10-CM | POA: Diagnosis not present

## 2022-06-03 DIAGNOSIS — E611 Iron deficiency: Secondary | ICD-10-CM | POA: Diagnosis not present

## 2022-06-03 NOTE — Telephone Encounter (Signed)
Peter Snyder to schedule their annual wellness visit. Appointment made for 06/11/22.  Peter Snyder AWV direct phone # [phone]   NHA out of office on 3/22 r/s appt to 3/27

## 2022-06-03 NOTE — Progress Notes (Signed)
Established Patient Office Visit  Subjective   Patient ID: Peter Snyder, male    DOB: 1955-08-23  Age: 67 y.o. MRN: [id number]  Chief Complaint  Patient presents with   Medical Management of Chronic Issues    HPI   Peter Snyder is here to discuss recent medication changes and dyspepsia.  He has a longstanding history of GERD and currently takes Protonix 40 mg twice daily.  He was diagnosed with some iron deficiency with low ferritin and low iron saturation but normal hemoglobin.  Hemoccults were negative.  He had had recent unremarkable colonoscopy and then had a EGD on 05-09-2022 which showed no evidence for any peptic ulcer or other significant abnormality.  He had been taking Celebrex 200 mg daily.  We placed him on Slow Fe and initially seem to be tolerating this fine but he had some concerns about dyspepsia worsening after starting slow iron.  He also had held his Celebrex and was using tramadol for his arthritis pain.  Denies any recent melena.  No hematemesis.  Other issue is cold feet at night especially.  has been wearing socks to bed and still has cold feet.  No generalized cold intolerance.  No claudication symptoms.  Walks 3 to 4 miles an hour without difficulty on treadmill.  Past Medical History:  Diagnosis Date   Allergy    Allergy to sulfa drugs 12/10/2020   Arthritis    CAD (coronary artery disease) 6/14   Cardiac catheterization 6/27/ 2014 ejection fraction 35-40%, 30% proximal left circumflex, 100% tiny obtuse marginal 1 with collaterals, 50% LAD, 50% D1, 100% RCA with collaterals   Chicken pox    Chronic back pain    Colon polyps    Depression    Diabetes mellitus ([place])    Diet controlled- OFF metformin 02-01-20   E coli infection 0000000   Folliculitis 0000000   GERD (gastroesophageal reflux disease)    Hyperlipidemia    Hypertension    Kidney stone    Myocardial infarction [facility])    ? year- cath 2014/ june    Past Surgical History:  Procedure Laterality  Date   COLONOSCOPY     CRYO INTERCOSTAL NERVE BLOCK     double sacrum nerve block   epidural injections     multiple- L5 S1 and cervical neck pain issues as well    POLYPECTOMY     [place]  2019    reports that he quit smoking about 24 years ago. His smoking use included cigarettes. He started smoking about 52 years ago. He has a 7.00 pack-year smoking history. He has never used smokeless tobacco. He reports that he does not drink alcohol and does not use drugs. family history includes Arthritis in his father and paternal grandmother; COPD in his father; Cancer in his mother; Clotting disorder in his paternal grandfather and paternal grandmother; Colon polyps in his father; Heart disease in his maternal grandmother; Hyperlipidemia in his maternal grandmother; Hypertension in his mother; Liver cancer in his paternal grandfather; Melanoma in his mother. Allergies  Allergen Reactions   Doxycycline Hyclate Nausea And Vomiting   Latex Other (See Comments)    Other   Tape     Use Paper Tape    Sulfa Antibiotics Rash    Review of Systems  Constitutional:  Negative for chills and fever.  Respiratory:  Negative for shortness of breath.   Cardiovascular:  Negative for chest pain.  Gastrointestinal:  Negative for blood in  stool, constipation, melena, nausea and vomiting.      Objective:     BP 110/60 (BP Location: Left Arm, Patient Position: Sitting, Cuff Size: Normal)   Pulse 95   Temp 98.4 F (36.9 C) (Oral)   Ht 6\' 2"  (1.88 m)   Wt 215 lb 9.6 oz (97.8 kg)   SpO2 98%   BMI 27.68 kg/m  BP Readings from Last 3 Encounters:  06/03/22 110/60  05/19/22 124/66  05/16/22 118/73   Wt Readings from Last 3 Encounters:  06/03/22 215 lb 9.6 oz (97.8 kg)  05/19/22 217 lb 9.6 oz (98.7 kg)  05/09/22 217 lb (98.4 kg)      Physical Exam Vitals reviewed.  Constitutional:      Appearance: Normal appearance.  Cardiovascular:     Rate and Rhythm:  Normal rate and regular rhythm.     Comments: Feet are warm to touch.  He has strong dorsalis pedis and posterior tibial pulses bilaterally with good capillary refill in both feet Pulmonary:     Effort: Pulmonary effort is normal.     Breath sounds: Normal breath sounds. No wheezing or rales.  Neurological:     Mental Status: He is alert.      No results found for any visits on 06/03/22.  Last CBC Lab Results  Component Value Date   WBC 5.2 05/02/2022   HGB 13.7 05/02/2022   HCT 40.7 05/02/2022   MCV 93.0 05/02/2022   MCH 30.6 12/18/2021   RDW 12.7 05/02/2022   PLT 149.0 (L) [id number]   Last metabolic panel Lab Results  Component Value Date   GLUCOSE 111 (H) 12/18/2021   NA 134 (L) 12/18/2021   K 4.0 12/18/2021   CL 101 12/18/2021   CO2 26 12/18/2021   BUN 17 12/18/2021   CREATININE 0.91 12/18/2021   GFRNONAA >60 12/18/2021   CALCIUM 9.0 12/18/2021   PROT 6.7 05/02/2022   ALBUMIN 4.2 05/02/2022   BILITOT 0.6 05/02/2022   ALKPHOS 49 05/02/2022   AST 17 05/02/2022   ALT 16 05/02/2022   ANIONGAP 7 12/18/2021   Last lipids Lab Results  Component Value Date   CHOL 131 05/02/2022   HDL 41.20 05/02/2022   LDLCALC 66 05/02/2022   LDLDIRECT 70.0 03/20/2016   TRIG 116.0 05/02/2022   CHOLHDL 3 05/02/2022   Last hemoglobin A1c Lab Results  Component Value Date   HGBA1C 6.8 (H) 05/02/2022   Last thyroid functions Lab Results  Component Value Date   TSH 1.62 03/30/2020      The 10-year ASCVD risk score (Arnett DK, et al., 2019) is: 20.7%    Assessment & Plan:   #1 iron deficiency with normal hemoglobin and recent unremarkable GI workup for obvious source of loss.  We suspect he may be poorly absorbing iron.  No clear indication for iron infusion at this point with normal hemoglobin but we did discuss repeating CBC along with ferritin in about 2-3 more months  #2 cold feet.  No evidence for arterial compromise.  TSH couple years ago was normal.   Reassurance   Peter Littler, MD

## 2022-06-04 ENCOUNTER — Encounter: Payer: Self-pay | Admitting: Family Medicine

## 2022-06-05 ENCOUNTER — Encounter: Payer: Self-pay | Admitting: Family Medicine

## 2022-06-08 ENCOUNTER — Encounter: Payer: Self-pay | Admitting: Gastroenterology

## 2022-06-10 ENCOUNTER — Ambulatory Visit (INDEPENDENT_AMBULATORY_CARE_PROVIDER_SITE_OTHER): Payer: Medicare Other | Admitting: Adult Health

## 2022-06-10 ENCOUNTER — Encounter: Payer: Self-pay | Admitting: Adult Health

## 2022-06-10 VITALS — BP 120/70 | HR 64 | Temp 97.5°F | Ht 74.0 in | Wt 214.0 lb

## 2022-06-10 DIAGNOSIS — K219 Gastro-esophageal reflux disease without esophagitis: Secondary | ICD-10-CM

## 2022-06-10 DIAGNOSIS — E611 Iron deficiency: Secondary | ICD-10-CM | POA: Diagnosis not present

## 2022-06-10 DIAGNOSIS — R5383 Other fatigue: Secondary | ICD-10-CM

## 2022-06-10 LAB — CBC WITH DIFFERENTIAL/PLATELET
Basophils Absolute: 0 10*3/uL (ref 0.0–0.1)
Basophils Relative: 0.6 % (ref 0.0–3.0)
Eosinophils Absolute: 0.1 10*3/uL (ref 0.0–0.7)
Eosinophils Relative: 1.9 % (ref 0.0–5.0)
HCT: 39.8 % (ref 39.0–52.0)
Hemoglobin: 13.5 g/dL (ref 13.0–17.0)
Lymphocytes Relative: 26.5 % (ref 12.0–46.0)
Lymphs Abs: 1.6 10*3/uL (ref 0.7–4.0)
MCHC: 34.1 g/dL (ref 30.0–36.0)
MCV: 92.2 fl (ref 78.0–100.0)
Monocytes Absolute: 0.5 10*3/uL (ref 0.1–1.0)
Monocytes Relative: 8.1 % (ref 3.0–12.0)
Neutro Abs: 3.7 10*3/uL (ref 1.4–7.7)
Neutrophils Relative %: 62.9 % (ref 43.0–77.0)
Platelets: 138 10*3/uL — ABNORMAL LOW (ref 150.0–400.0)
RBC: 4.31 Mil/uL (ref 4.22–5.81)
RDW: 13 % (ref 11.5–15.5)
WBC: 5.9 10*3/uL (ref 4.0–10.5)

## 2022-06-10 LAB — IBC + FERRITIN
Ferritin: 20 ng/mL — ABNORMAL LOW (ref 22.0–322.0)
Iron: 118 ug/dL (ref 42–165)
Saturation Ratios: 28.9 % (ref 20.0–50.0)
TIBC: 408.8 ug/dL (ref 250.0–450.0)
Transferrin: 292 mg/dL (ref 212.0–360.0)

## 2022-06-10 NOTE — Progress Notes (Signed)
Subjective:    Patient ID: Peter Snyder, male    DOB: 1955-12-10, 67 y.o.   MRN: [id number]  HPI 67 year old male who is being evaluated today for multiple issues.  He reports some abdominal discomfort described as a burning sensation and lack of appetite.  He had a recent endoscopy which was normal, after the endoscopy started back on Celebrex for osteoarthritic pain.  He started having the burning sensation when he started back on Celebrex.  He did reach out to gastroenterology who advised him to no longer take NSAIDs.  His orthopedist has put him on tramadol.  He is also taking Protonix.  He does continue to have a burning sensation in his abdomen.  Denies diarrhea, blood in stool, or constipation  There are more he reports mild fatigue and plan of coldness in his lower extremities.  Does have a history of iron deficiency anemia.  Was on iron supplementation from November 2023 to January 2024.  His hemoglobin level went up but ferritin stated below.  He discontinued his iron supplementation.     Review of Systems See HPI   Past Medical History:  Diagnosis Date   Allergy    Allergy to sulfa drugs 12/10/2020   Arthritis    CAD (coronary artery disease) 6/14   Cardiac catheterization 6/27/ 2014 ejection fraction 35-40%, 30% proximal left circumflex, 100% tiny obtuse marginal 1 with collaterals, 50% LAD, 50% D1, 100% RCA with collaterals   Chicken pox    Chronic back pain    Colon polyps    Depression    Diabetes mellitus ([place])    Diet controlled- OFF metformin 02-01-20   E coli infection 0000000   Folliculitis 0000000   GERD (gastroesophageal reflux disease)    Hyperlipidemia    Hypertension    Kidney stone    Myocardial infarction [facility])    ? year- cath 2014/ june     Social History   Socioeconomic History   Marital status: Married    Spouse name: Not on file   Number of children: 3   Years of education: Not on file   Highest education level: Bachelor's degree (e.g.,  BA, AB, BS)  Occupational History   Occupation: retired    Comment: Retired  Tobacco Use   Smoking status: Former    Packs/day: 0.25    Years: 28.00    Additional pack years: 0.00    Total pack years: 7.00    Types: Cigarettes    Start date: 62    Quit date: 2000    Years since quitting: 24.2   Smokeless tobacco: Never  Vaping Use   Vaping Use: Never used  Substance and Sexual Activity   Alcohol use: No    Alcohol/week: 0.0 standard drinks of alcohol   Drug use: No   Sexual activity: Not on file  Other Topics Concern   Not on file  Social History Narrative   Not on file   Social Determinants of Health   Financial Resource Strain: Low Risk  (02/19/2022)   Overall Financial Resource Strain (CARDIA)    Difficulty of Paying Living Expenses: Not hard at all  Food Insecurity: No Food Insecurity (03/11/2022)   Hunger Vital Sign    Worried About Running Out of Food in the Last Year: Never true    Ran Out of Food in the Last Year: Never true  Transportation Needs: No Transportation Needs (03/11/2022)   PRAPARE - Hydrologist (Medical): No  Lack of Transportation (Non-Medical): No  Physical Activity: Sufficiently Active (02/19/2022)   Exercise Vital Sign    Days of Exercise per Week: 6 days    Minutes of Exercise per Session: 60 min  Stress: No Stress Concern Present (02/19/2022)   [place]    Feeling of Stress : Only a little  Social Connections: Socially Integrated (02/19/2022)   Social Connection and Isolation Panel [NHANES]    Frequency of Communication with Friends and Family: More than three times a week    Frequency of Social Gatherings with Friends and Family: More than three times a week    Attends Religious Services: 1 to 4 times per year    Active Member of [employer] or Organizations: Yes    Attends Archivist Meetings: 1 to 4 times per year    Marital Status:  Married  Human resources officer Violence: Not At Risk (06/03/2021)   Humiliation, Afraid, Rape, and Kick questionnaire    Fear of Current or Ex-Partner: No    Emotionally Abused: No    Physically Abused: No    Sexually Abused: No    Past Surgical History:  Procedure Laterality Date   COLONOSCOPY     CRYO INTERCOSTAL NERVE BLOCK     double sacrum nerve block   epidural injections     multiple- L5 S1 and cervical neck pain issues as well    POLYPECTOMY     [place]  2019    Family History  Problem Relation Age of Onset   Melanoma Mother        metastatic   Cancer Mother        melanoma   Hypertension Mother    Arthritis Father    Colon polyps Father    COPD Father    Hyperlipidemia Maternal Grandmother    Heart disease Maternal Grandmother    Arthritis Paternal Grandmother    Clotting disorder Paternal Grandmother    Clotting disorder Paternal Grandfather    Liver cancer Paternal Grandfather    Colon cancer Neg Hx    Esophageal cancer Neg Hx    Rectal cancer Neg Hx    Stomach cancer Neg Hx     Allergies  Allergen Reactions   Doxycycline Hyclate Nausea And Vomiting   Latex Other (See Comments)    Other   Tape     Use Paper Tape    Sulfa Antibiotics Rash    Current Outpatient Medications on File Prior to Visit  Medication Sig Dispense Refill   acetaminophen (TYLENOL) 500 MG tablet Take 500 mg by mouth every 6 (six) hours as needed for moderate pain.     ALPRAZolam (XANAX) 0.25 MG tablet Take 0.25 mg by mouth daily as needed for anxiety.     aspirin 81 MG tablet Take 81 mg by mouth daily.     atorvastatin (LIPITOR) 80 MG tablet TAKE 1 TABLET BY MOUTH EVERY DAY 90 tablet 1   Azelastine HCl 137 MCG/SPRAY SOLN PLACE 1 SPRAY INTO BOTH NOSTRILS 2 TIMES DAILY AS DIRECTED 90 mL 1   carvedilol (COREG) 12.5 MG tablet TAKE 1 TABLET TWICE A DAY WITH A MEAL 180 tablet 0   celecoxib (CELEBREX) 200 MG capsule Take by mouth.     diclofenac Sodium  (VOLTAREN) 1 % GEL Apply 2 g topically daily as needed (for pain).     escitalopram (LEXAPRO) 20 MG tablet Take 20 mg by mouth at  bedtime.     ezetimibe (ZETIA) 10 MG tablet TAKE 1 TABLET BY MOUTH EVERY DAY 90 tablet 1   gabapentin (NEURONTIN) 600 MG tablet TAKE 1 TABLET BY MOUTH TWICE A DAY 180 tablet 1   LOTEMAX 0.5 % GEL SMARTSIG:1 Drop(s) In Eye(s) Twice Daily PRN     pantoprazole (PROTONIX) 40 MG tablet Take 1 tablet (40 mg total) by mouth 2 (two) times daily. 180 tablet 3   RESTASIS 0.05 % ophthalmic emulsion Place 1 drop into both eyes 2 (two) times daily.      sacubitril-valsartan (ENTRESTO) 49-51 MG Take 1 tablet by mouth 2 (two) times daily. 180 tablet 3   tamsulosin (FLOMAX) 0.4 MG CAPS capsule TAKE 1 CAPSULE EVERY DAY AFTER SUPPER 90 capsule 0   ULTRAM 50 MG tablet Take 50 mg by mouth 3 (three) times daily as needed.     zolpidem (AMBIEN) 10 MG tablet Take 10 mg by mouth at bedtime as needed for sleep.     No current facility-administered medications on file prior to visit.    BP 120/70 (BP Location: Left Arm, Patient Position: Sitting, Cuff Size: Normal)   Pulse 64   Temp (!) 97.5 F (36.4 C) (Oral)   Ht 6\' 2"  (1.88 m)   Wt 214 lb (97.1 kg)   SpO2 99%   BMI 27.48 kg/m       Objective:   Physical Exam Vitals and nursing note reviewed.  Constitutional:      Appearance: Normal appearance.  Abdominal:     General: Abdomen is flat. Bowel sounds are normal.     Palpations: Abdomen is soft.     Tenderness: There is abdominal tenderness in the epigastric area and left upper quadrant.  Neurological:     General: No focal deficit present.     Mental Status: He is alert and oriented to person, place, and time.  Psychiatric:        Mood and Affect: Mood normal.        Behavior: Behavior normal.        Thought Content: Thought content normal.        Judgment: Judgment normal.       Assessment & Plan:  1. Iron deficiency  - CBC with Differential/Platelet; Future -  IBC + Ferritin; Future - IBC + Ferritin - CBC with Differential/Platelet  2. Other fatigue - Will recheck iron levels today  - CBC with Differential/Platelet; Future - IBC + Ferritin; Future - IBC + Ferritin - CBC with Differential/Platelet  3. Gastroesophageal reflux disease without esophagitis - [place] diet x 72 hours - Avoid NSAIDS and spicey/acidic/fried foods  Dorothyann Peng, NP

## 2022-06-11 ENCOUNTER — Telehealth: Payer: Self-pay | Admitting: Adult Health

## 2022-06-11 ENCOUNTER — Ambulatory Visit (INDEPENDENT_AMBULATORY_CARE_PROVIDER_SITE_OTHER): Payer: Medicare Other

## 2022-06-11 ENCOUNTER — Encounter: Payer: Self-pay | Admitting: Adult Health

## 2022-06-11 VITALS — BP 120/62 | HR 72 | Temp 98.6°F | Ht 74.0 in | Wt 213.7 lb

## 2022-06-11 DIAGNOSIS — Z0000 Encounter for general adult medical examination without abnormal findings: Secondary | ICD-10-CM

## 2022-06-11 NOTE — Progress Notes (Signed)
Subjective:   Peter Snyder is a 67 y.o. male who presents for Medicare Annual/Subsequent preventive examination.  Review of Systems      Cardiac Risk Factors include: advanced age (>60men, >73 women);male gender;diabetes mellitus;hypertension;dyslipidemia     Objective:    Today's Vitals   06/11/22 1056  BP: 120/62  Pulse: 72  Temp: 98.6 F (37 C)  TempSrc: Oral  SpO2: 98%  Weight: 213 lb 11.2 oz (96.9 kg)  Height: 6\' 2"  (1.88 m)   Body mass index is 27.44 kg/m.     06/11/2022   11:06 AM 04/01/2022    3:47 PM 12/18/2021    4:53 PM 06/03/2021   11:04 AM 05/07/2021    5:04 AM 01/06/2017    7:17 AM 04/02/2014    8:55 PM  Advanced Directives  Does Patient Have a Medical Advance Directive? Yes No No Yes No No No  Type of Paramedic of [place];Living will   [place]     Does patient want to make changes to medical advance directive?    No - Patient declined     Copy of [place] in Chart? No - copy requested   No - copy requested     Would patient like information on creating a medical advance directive?  No - Patient declined   No - Patient declined  No - patient declined information    Current Medications (verified) Outpatient Encounter Medications as of 06/11/2022  Medication Sig   acetaminophen (TYLENOL) 500 MG tablet Take 500 mg by mouth every 6 (six) hours as needed for moderate pain.   ALPRAZolam (XANAX) 0.25 MG tablet Take 0.25 mg by mouth daily as needed for anxiety.   aspirin 81 MG tablet Take 81 mg by mouth daily.   atorvastatin (LIPITOR) 80 MG tablet TAKE 1 TABLET BY MOUTH EVERY DAY   Azelastine HCl 137 MCG/SPRAY SOLN PLACE 1 SPRAY INTO BOTH NOSTRILS 2 TIMES DAILY AS DIRECTED   carvedilol (COREG) 12.5 MG tablet TAKE 1 TABLET TWICE A DAY WITH A MEAL   diclofenac Sodium (VOLTAREN) 1 % GEL Apply 2 g topically daily as needed (for pain).   escitalopram (LEXAPRO) 20 MG tablet Take 20 mg by mouth at  bedtime.   ezetimibe (ZETIA) 10 MG tablet TAKE 1 TABLET BY MOUTH EVERY DAY   gabapentin (NEURONTIN) 600 MG tablet TAKE 1 TABLET BY MOUTH TWICE A DAY   LOTEMAX 0.5 % GEL SMARTSIG:1 Drop(s) In Eye(s) Twice Daily PRN   pantoprazole (PROTONIX) 40 MG tablet Take 1 tablet (40 mg total) by mouth 2 (two) times daily.   RESTASIS 0.05 % ophthalmic emulsion Place 1 drop into both eyes 2 (two) times daily.    sacubitril-valsartan (ENTRESTO) 49-51 MG Take 1 tablet by mouth 2 (two) times daily.   tamsulosin (FLOMAX) 0.4 MG CAPS capsule TAKE 1 CAPSULE EVERY DAY AFTER SUPPER   ULTRAM 50 MG tablet Take 50 mg by mouth 3 (three) times daily as needed.   zolpidem (AMBIEN) 10 MG tablet Take 10 mg by mouth at bedtime as needed for sleep.   No facility-administered encounter medications on file as of 06/11/2022.    Allergies (verified) Doxycycline hyclate, Latex, Tape, and Sulfa antibiotics   History: Past Medical History:  Diagnosis Date   Allergy    Allergy to sulfa drugs 12/10/2020   Arthritis    CAD (coronary artery disease) 6/14   Cardiac catheterization 6/27/ 2014 ejection fraction 35-40%, 30% proximal left circumflex,  100% tiny obtuse marginal 1 with collaterals, 50% LAD, 50% D1, 100% RCA with collaterals   Chicken pox    Chronic back pain    Colon polyps    Depression    Diabetes mellitus ([place])    Diet controlled- OFF metformin 02-01-20   E coli infection 0000000   Folliculitis 0000000   GERD (gastroesophageal reflux disease)    Hyperlipidemia    Hypertension    Kidney stone    Myocardial infarction [facility])    ? year- cath 2014/ june    Past Surgical History:  Procedure Laterality Date   COLONOSCOPY     CRYO INTERCOSTAL NERVE BLOCK     double sacrum nerve block   epidural injections     multiple- L5 S1 and cervical neck pain issues as well    POLYPECTOMY     TONSILLECTOMY  1963   TRIGGER FINGER RELEASE  2019   Family History  Problem Relation Age of Onset   Melanoma Mother         metastatic   Cancer Mother        melanoma   Hypertension Mother    Arthritis Father    Colon polyps Father    COPD Father    Hyperlipidemia Maternal Grandmother    Heart disease Maternal Grandmother    Arthritis Paternal Grandmother    Clotting disorder Paternal Grandmother    Clotting disorder Paternal Grandfather    Liver cancer Paternal Grandfather    Colon cancer Neg Hx    Esophageal cancer Neg Hx    Rectal cancer Neg Hx    Stomach cancer Neg Hx    Social History   Socioeconomic History   Marital status: Married    Spouse name: Not on file   Number of children: 3   Years of education: Not on file   Highest education level: Bachelor's degree (e.g., BA, AB, BS)  Occupational History   Occupation: retired    Comment: Retired  Tobacco Use   Smoking status: Former    Packs/day: 0.25    Years: 28.00    Additional pack years: 0.00    Total pack years: 7.00    Types: Cigarettes    Start date: 26    Quit date: 2000    Years since quitting: 24.2   Smokeless tobacco: Never  Vaping Use   Vaping Use: Never used  Substance and Sexual Activity   Alcohol use: No    Alcohol/week: 0.0 standard drinks of alcohol   Drug use: No   Sexual activity: Not on file  Other Topics Concern   Not on file  Social History Narrative   Not on file   Social Determinants of Health   Financial Resource Strain: Low Risk  (06/11/2022)   Overall Financial Resource Strain (CARDIA)    Difficulty of Paying Living Expenses: Not hard at all  Food Insecurity: No Food Insecurity (06/11/2022)   Hunger Vital Sign    Worried About Running Out of Food in the Last Year: Never true    [place] in the Last Year: Never true  Transportation Needs: No Transportation Needs (06/11/2022)   PRAPARE - Hydrologist (Medical): No    Lack of Transportation (Non-Medical): No  Physical Activity: Sufficiently Active (06/11/2022)   Exercise Vital Sign    Days of Exercise per  Week: 5 days    Minutes of Exercise per Session: 50 min  Stress: No Stress Concern Present (06/11/2022)  [employer] [place] Questionnaire    Feeling of Stress : Not at all  Social Connections: Socially Integrated (06/11/2022)   Social Connection and Isolation Panel [NHANES]    Frequency of Communication with Friends and Family: More than three times a week    Frequency of Social Gatherings with Friends and Family: More than three times a week    Attends Religious Services: More than 4 times per year    Active Member of [employer] or Organizations: Yes    Attends Music therapist: More than 4 times per year    Marital Status: Married    Tobacco Counseling Counseling given: Not Answered   Clinical Intake:    BMI - recorded: 27.44 Nutritional Status: BMI 25 -29 Overweight Nutritional Risks: None Diabetes: Yes CBG done?: No Did pt. bring in CBG monitor from home?: No  How often do you need to have someone help you when you read instructions, pamphlets, or other written materials from your doctor or pharmacy?: 1 - Never  Diabetic?  Yes  Interpreter Needed?: No Nutrition Risk Assessment:  Has the patient had any N/V/D within the last 2 months?  No  Does the patient have any non-healing wounds?  No  Has the patient had any unintentional weight loss or weight gain?  No   Diabetes:  Is the patient diabetic?  Yes  If diabetic, was a CBG obtained today?  No Did the patient bring in their glucometer from home?  No  How often do you monitor your CBG's? PRN.   Financial Strains and Diabetes Management:  Are you having any financial strains with the device, your supplies or your medication? No .  Does the patient want to be seen by Chronic Care Management for management of their diabetes?  No  Would the patient like to be referred to a Nutritionist or for Diabetic Management?  No   Diabetic Exams:  Diabetic Eye Exam: Completed  Yes. Overdue for diabetic eye exam. Pt has been advised about the importance in completing this exam. A referral has been placed today. Message sent to referral coordinator for scheduling purposes. Advised pt to expect a call from office referred to regarding appt.  Diabetic Foot Exam: Completed Yes. Pt has been advised about the importance in completing this exam. Pt is scheduled for diabetic foot exam on Followed by PCP.   Information entered by :: Rolene Arbour LPN   Activities of Daily Living    06/11/2022   11:04 AM 06/07/2022    9:32 AM  In your present state of health, do you have any difficulty performing the following activities:  Hearing? 0 0  Vision? 0 0  Difficulty concentrating or making decisions? 0 0  Walking or climbing stairs? 0 0  Dressing or bathing? 0 0  Doing errands, shopping? 0 0  Preparing Food and eating ? N N  Using the Toilet? N N  In the past six months, have you accidently leaked urine? N N  Do you have problems with loss of bowel control? N N  Managing your Medications? N N  Managing your Finances? N N  Housekeeping or managing your Housekeeping? N N    Patient Care Team: [name], MD as PCP - General (Family Medicine)  Indicate any recent Medical Services you may have received from other than [facility] providers in the past year (date may be approximate).     Assessment:   This is a routine wellness examination  for [place].  Hearing/Vision screen Hearing Screening - Comments:: Denies hearing difficulties   Vision Screening - Comments:: Wears rx glasses - up to date with routine eye exams with  Dr Augusto Gamble  Dietary issues and exercise activities discussed: Current Exercise Habits: Home exercise routine, Type of exercise: walking, Time (Minutes): 50, Frequency (Times/Week): 5, Weekly Exercise (Minutes/Week): 250, Intensity: Moderate, Exercise limited by: None identified   Goals Addressed               This Visit's Progress     No current  goals (pt-stated)         Depression Screen    06/11/2022   11:03 AM 05/02/2022    7:59 AM 12/30/2021    8:57 AM 12/27/2021    9:21 AM 11/20/2021   11:54 AM 11/01/2021   11:56 AM 06/03/2021   10:53 AM  PHQ 2/9 Scores  PHQ - 2 Score 0 0 0 0 1 0 0  PHQ- 9 Score   0  2 1 0    Fall Risk    06/11/2022   11:05 AM 06/07/2022    9:32 AM 06/02/2022    8:51 AM 05/02/2022    7:58 AM 02/19/2022    8:51 AM  Fall Risk   Falls in the past year? 0 0 0 0 0  Number falls in past yr: 0   0   Injury with Fall? 0 0 0 0   Risk for fall due to : No Fall Risks   No Fall Risks   Follow up Falls prevention discussed   Falls evaluation completed     FALL RISK PREVENTION PERTAINING TO THE HOME:  Any stairs in or around the home? Yes If so, are there any without handrails? No  Home free of loose throw rugs in walkways, pet beds, electrical cords, etc? Yes  Adequate lighting in your home to reduce risk of falls? Yes   ASSISTIVE DEVICES UTILIZED TO PREVENT FALLS:  Life alert? No  Use of a cane, walker or w/c? No  Grab bars in the bathroom? No  Shower chair or bench in shower? No  Elevated toilet seat or a handicapped toilet? No   TIMED UP AND GO:  Was the test performed? Yes .  Length of time to ambulate 10 feet: 10 sec.   Gait steady and fast without use of assistive device  Cognitive Function:        06/11/2022   11:06 AM 06/03/2021   11:04 AM  6CIT Screen  What Year? 0 points 0 points  What month? 0 points 0 points  What time? 0 points   Count back from 20 0 points   Months in reverse 0 points   Repeat phrase 0 points   Total Score 0 points     Immunizations Immunization History  Administered Date(s) Administered   Fluad Quad(high Dose 65+) 11/15/2021   Hepatitis A, Adult 06/22/2014   Influenza Whole 10/15/2012   Influenza,inj,Quad PF,6+ Mos 12/02/2013, 12/08/2014, 11/28/2015, 11/24/2016, 11/17/2017, 10/20/2018, 11/17/2019   Influenza,inj,quad, With Preservative 01/19/2017,  10/20/2018   Influenza-Unspecified 11/25/2020   PFIZER Comirnaty(Gray Top)Covid-19 Tri-Sucrose Vaccine 06/21/2020   PFIZER(Purple Top)SARS-COV-2 Vaccination 04/22/2019, 05/17/2019, 12/14/2019   Pfizer Covid-19 Vaccine Bivalent Booster 17yrs & up 12/10/2020   Pneumococcal Conjugate-13 03/14/2014   Pneumococcal Polysaccharide-23 12/29/2012, 05/25/2020   Respiratory Syncytial Virus Vaccine,Recomb Aduvanted(Arexvy) 03/09/2022   Td 03/17/2004   Tdap 03/14/2014   Zoster Recombinat (Shingrix) 03/06/2017, 03/08/2018   Zoster, Live 10/16/2011    TDAP  status: Up to date  Flu Vaccine status: Up to date  Pneumococcal vaccine status: Up to date  Covid-19 vaccine status: Completed vaccines  Qualifies for Shingles Vaccine? Yes   Zostavax completed Yes   Shingrix Completed?: Yes  Screening Tests Health Maintenance  Topic Date Due   FOOT EXAM  06/12/2022 (Originally 03/30/2021)   COVID-19 Vaccine (6 - 2023-24 season) 06/27/2022 (Originally 11/15/2021)   OPHTHALMOLOGY EXAM  08/14/2022   HEMOGLOBIN A1C  10/31/2022   Diabetic kidney evaluation - eGFR measurement  12/19/2022   Diabetic kidney evaluation - Urine ACR  05/03/2023   Medicare Annual Wellness (AWV)  06/11/2023   DTaP/Tdap/Td (3 - Td or Tdap) 03/14/2024   COLONOSCOPY (Pts 45-74yrs Insurance coverage will need to be confirmed)  05/07/2025   Pneumonia Vaccine 20+ Years old  Completed   INFLUENZA VACCINE  Completed   Hepatitis C Screening  Completed   Zoster Vaccines- Shingrix  Completed   HPV VACCINES  Aged Out    Health Maintenance  There are no preventive care reminders to display for this patient.   Colorectal cancer screening: Type of screening: Colonoscopy. Completed 05/07/20. Repeat every 5 years  Lung Cancer Screening: (Low Dose CT Chest recommended if Age 33-80 years, 30 pack-year currently smoking OR have quit w/in 15years.) does not qualify.     Additional Screening:  Hepatitis C Screening: does qualify; Completed  03/06/17  Vision Screening: Recommended annual ophthalmology exams for early detection of glaucoma and other disorders of the eye. Is the patient up to date with their annual eye exam?  Yes  Who is the provider or what is the name of the office in which the patient attends annual eye exams? Dr Augusto Gamble If pt is not established with a provider, would they like to be referred to a provider to establish care? No .   Dental Screening: Recommended annual dental exams for proper oral hygiene  Community Resource Referral / Chronic Care Management:  CRR required this visit?  No   CCM required this visit?  No      Plan:     I have personally reviewed and noted the following in the patient's chart:   Medical and social history Use of alcohol, tobacco or illicit drugs  Current medications and supplements including opioid prescriptions. Patient is currently taking opioid prescriptions. Information provided to patient regarding non-opioid alternatives. Patient advised to discuss non-opioid treatment plan with their provider. Functional ability and status Nutritional status Physical activity Advanced directives List of other physicians Hospitalizations, surgeries, and ER visits in previous 12 months Vitals Screenings to include cognitive, depression, and falls Referrals and appointments  In addition, I have reviewed and discussed with patient certain preventive protocols, quality metrics, and best practice recommendations. A written personalized care plan for preventive services as well as general preventive health recommendations were provided to patient.     Criselda Peaches, LPN   [id number]   Nurse Notes: None

## 2022-06-11 NOTE — Patient Instructions (Addendum)
Peter Snyder , Thank you for taking time to come for your Medicare Wellness Visit. I appreciate your ongoing commitment to your health goals. Please review the following plan we discussed and let me know if I can assist you in the future.   These are the goals we discussed:  Goals       Increase physical activity (pt-stated)      Maintain good health      No current goals (pt-stated)        This is a list of the screening recommended for you and due dates:  Health Maintenance  Topic Date Due   Complete foot exam   06/12/2022*   COVID-19 Vaccine (6 - 2023-24 season) 06/27/2022*   Eye exam for diabetics  08/14/2022   Hemoglobin A1C  10/31/2022   Yearly kidney function blood test for diabetes  12/19/2022   Yearly kidney health urinalysis for diabetes  05/03/2023   Medicare Annual Wellness Visit  06/11/2023   DTaP/Tdap/Td vaccine (3 - Td or Tdap) 03/14/2024   Colon Cancer Screening  05/07/2025   Pneumonia Vaccine  Completed   Flu Shot  Completed   Hepatitis C Screening: USPSTF Recommendation to screen - Ages 18-79 yo.  Completed   Zoster (Shingles) Vaccine  Completed   HPV Vaccine  Aged Out  *Topic was postponed. The date shown is not the original due date.   Opioid Pain Medicine Management Opioids are powerful medicines that are used to treat moderate to severe pain. When used for short periods of time, they can help you to: Sleep better. Do better in physical or occupational therapy. Feel better in the first few days after an injury. Recover from surgery. Opioids should be taken with the supervision of a trained health care provider. They should be taken for the shortest period of time possible. This is because opioids can be addictive, and the longer you take opioids, the greater your risk of addiction. This addiction can also be called opioid use disorder. What are the risks? Using opioid pain medicines for longer than 3 days increases your risk of side effects. Side effects  include: Constipation. Nausea and vomiting. Breathing difficulties (respiratory depression). Drowsiness. Confusion. Opioid use disorder. Itching. Taking opioid pain medicine for a long period of time can affect your ability to do daily tasks. It also puts you at risk for: Motor vehicle crashes. Depression. Suicide. Heart attack. Overdose, which can be life-threatening. What is a pain treatment plan? A pain treatment plan is an agreement between you and your health care provider. Pain is unique to each person, and treatments vary depending on your condition. To manage your pain, you and your health care provider need to work together. To help you do this: Discuss the goals of your treatment, including how much pain you might expect to have and how you will manage the pain. Review the risks and benefits of taking opioid medicines. Remember that a good treatment plan uses more than one approach and minimizes the chance of side effects. Be honest about the amount of medicines you take and about any drug or alcohol use. Get pain medicine prescriptions from only one health care provider. Pain can be managed with many types of alternative treatments. Ask your health care provider to refer you to one or more specialists who can help you manage pain through: Physical or occupational therapy. Counseling (cognitive behavioral therapy). Good nutrition. Biofeedback. Massage. Meditation. Non-opioid medicine. Following a gentle exercise program. How to use opioid pain medicine  Taking medicine Take your pain medicine exactly as told by your health care provider. Take it only when you need it. If your pain gets less severe, you may take less than your prescribed dose if your health care provider approves. If you are not having pain, do nottake pain medicine unless your health care provider tells you to take it. If your pain is severe, do nottry to treat it yourself by taking more pills than  instructed on your prescription. Contact your health care provider for help. Write down the times when you take your pain medicine. It is easy to become confused while on pain medicine. Writing the time can help you avoid overdose. Take other over-the-counter or prescription medicines only as told by your health care provider. Keeping yourself and others safe  While you are taking opioid pain medicine: Do not drive, use machinery, or power tools. Do not sign legal documents. Do not drink alcohol. Do not take sleeping pills. Do not supervise children by yourself. Do not do activities that require climbing or being in high places. Do not go to a lake, river, ocean, spa, or swimming pool. Do not share your pain medicine with anyone. Keep pain medicine in a locked cabinet or in a secure area where pets and children cannot reach it. Stopping your use of opioids If you have been taking opioid medicine for more than a few weeks, you may need to slowly decrease (taper) how much you take until you stop completely. Tapering your use of opioids can decrease your risk of symptoms of withdrawal, such as: Pain and cramping in the abdomen. Nausea. Sweating. Sleepiness. Restlessness. Uncontrollable shaking (tremors). Cravings for the medicine. Do not attempt to taper your use of opioids on your own. Talk with your health care provider about how to do this. Your health care provider may prescribe a step-down schedule based on how much medicine you are taking and how long you have been taking it. Getting rid of leftover pills Do not save any leftover pills. Get rid of leftover pills safely by: Taking the medicine to a prescription take-back program. This is usually offered by the county or law enforcement. Bringing them to a pharmacy that has a drug disposal container. Flushing them down the toilet. Check the label or package insert of your medicine to see whether this is safe to do. Throwing them out in  the trash. Check the label or package insert of your medicine to see whether this is safe to do. If it is safe to throw it out, remove the medicine from the original container, put it into a sealable bag or container, and mix it with used coffee grounds, food scraps, dirt, or cat litter before putting it in the trash. Follow these instructions at home: Activity Do exercises as told by your health care provider. Avoid activities that make your pain worse. Return to your normal activities as told by your health care provider. Ask your health care provider what activities are safe for you. General instructions You may need to take these actions to prevent or treat constipation: Drink enough fluid to keep your urine pale yellow. Take over-the-counter or prescription medicines. Eat foods that are high in fiber, such as beans, whole grains, and fresh fruits and vegetables. Limit foods that are high in fat and processed sugars, such as fried or sweet foods. Keep all follow-up visits. This is important. Where to find support If you have been taking opioids for a long time, you may  benefit from receiving support for quitting from a local support group or counselor. Ask your health care provider for a referral to these resources in your area. Where to find more information Centers for Disease Control and Prevention (CDC): http://www.wolf.info/ [place] Food and Drug Administration (FDA): [place] Get help right away if: You may have taken too much of an opioid (overdosed). Common symptoms of an overdose: Your breathing is slower or more shallow than normal. You have a very slow heartbeat (pulse). You have slurred speech. You have nausea and vomiting. Your pupils become very small. You have other potential symptoms: You are very confused. You faint or feel like you will faint. You have cold, clammy skin. You have blue lips or fingernails. You have thoughts of harming yourself or harming others. These  symptoms may represent a serious problem that is an emergency. Do not wait to see if the symptoms will go away. Get medical help right away. Call your local emergency services (911 in the [place] Do not drive yourself to the hospital.  If you ever feel like you may hurt yourself or others, or have thoughts about taking your own life, get help right away. Go to your nearest emergency department or: Call your local emergency services (911 in the [place] Call the [facility] 718-576-9436 in the [place] Call a suicide crisis helpline, such as the [place] at (919)681-7911 or 988 in the [place]. This is open 24 hours a day in the [place] Text the Crisis Text Line at (819) 064-3252 (in the [place].). Summary Opioid medicines can help you manage moderate to severe pain for a short period of time. A pain treatment plan is an agreement between you and your health care provider. Discuss the goals of your treatment, including how much pain you might expect to have and how you will manage the pain. If you think that you or someone else may have taken too much of an opioid, get medical help right away. This information is not intended to replace advice given to you by your health care provider. Make sure you discuss any questions you have with your health care provider. Document Revised: 09/26/2020 Document Reviewed: 06/13/2020 Elsevier Patient Education  [place] directives: Please bring a copy of your health care power of attorney and living will to the office to be added to your chart at your convenience.   Conditions/risks identified: None  Next appointment: Follow up in one year for your annual wellness visit.   Preventive Care 55 Years and Older, Male  Preventive care refers to lifestyle choices and visits with your health care provider that can promote health and wellness. What does preventive care include? A yearly physical exam. This is also  called an annual well check. Dental exams once or twice a year. Routine eye exams. Ask your health care provider how often you should have your eyes checked. Personal lifestyle choices, including: Daily care of your teeth and gums. Regular physical activity. Eating a healthy diet. Avoiding tobacco and drug use. Limiting alcohol use. Practicing safe sex. Taking low doses of aspirin every day. Taking vitamin and mineral supplements as recommended by your health care provider. What happens during an annual well check? The services and screenings done by your health care provider during your annual well check will depend on your age, overall health, lifestyle risk factors, and family history of disease. Counseling  Your health care provider may ask you questions about your: Alcohol use. Tobacco  use. Drug use. Emotional well-being. Home and relationship well-being. Sexual activity. Eating habits. History of falls. Memory and ability to understand (cognition). Work and work Statistician. Screening  You may have the following tests or measurements: Height, weight, and BMI. Blood pressure. Lipid and cholesterol levels. These may be checked every 5 years, or more frequently if you are over 91 years old. Skin check. Lung cancer screening. You may have this screening every year starting at age 71 if you have a 30-pack-year history of smoking and currently smoke or have quit within the past 15 years. Fecal occult blood test (FOBT) of the stool. You may have this test every year starting at age 35. Flexible sigmoidoscopy or colonoscopy. You may have a sigmoidoscopy every 5 years or a colonoscopy every 10 years starting at age 74. Prostate cancer screening. Recommendations will vary depending on your family history and other risks. Hepatitis C blood test. Hepatitis B blood test. Sexually transmitted disease (STD) testing. Diabetes screening. This is done by checking your blood sugar (glucose)  after you have not eaten for a while (fasting). You may have this done every 1-3 years. Abdominal aortic aneurysm (AAA) screening. You may need this if you are a current or former smoker. Osteoporosis. You may be screened starting at age 24 if you are at high risk. Talk with your health care provider about your test results, treatment options, and if necessary, the need for more tests. Vaccines  Your health care provider may recommend certain vaccines, such as: Influenza vaccine. This is recommended every year. Tetanus, diphtheria, and acellular pertussis (Tdap, Td) vaccine. You may need a Td booster every 10 years. Zoster vaccine. You may need this after age 80. Pneumococcal 13-valent conjugate (PCV13) vaccine. One dose is recommended after age 31. Pneumococcal polysaccharide (PPSV23) vaccine. One dose is recommended after age 75. Talk to your health care provider about which screenings and vaccines you need and how often you need them. This information is not intended to replace advice given to you by your health care provider. Make sure you discuss any questions you have with your health care provider. Document Released: 03/30/2015 Document Revised: 11/21/2015 Document Reviewed: 01/02/2015 Elsevier Interactive Patient Education  2017 [place] Prevention in the Home Falls can cause injuries. They can happen to people of all ages. There are many things you can do to make your home safe and to help prevent falls. What can I do on the outside of my home? Regularly fix the edges of walkways and driveways and fix any cracks. Remove anything that might make you trip as you walk through a door, such as a raised step or threshold. Trim any bushes or trees on the path to your home. Use bright outdoor lighting. Clear any walking paths of anything that might make someone trip, such as rocks or tools. Regularly check to see if handrails are loose or broken. Make sure that both sides of any  steps have handrails. Any raised decks and porches should have guardrails on the edges. Have any leaves, snow, or ice cleared regularly. Use sand or salt on walking paths during winter. Clean up any spills in your garage right away. This includes oil or grease spills. What can I do in the bathroom? Use night lights. Install grab bars by the toilet and in the tub and shower. Do not use towel bars as grab bars. Use non-skid mats or decals in the tub or shower. If you need to sit down in the  shower, use a plastic, non-slip stool. Keep the floor dry. Clean up any water that spills on the floor as soon as it happens. Remove soap buildup in the tub or shower regularly. Attach bath mats securely with double-sided non-slip rug tape. Do not have throw rugs and other things on the floor that can make you trip. What can I do in the bedroom? Use night lights. Make sure that you have a light by your bed that is easy to reach. Do not use any sheets or blankets that are too big for your bed. They should not hang down onto the floor. Have a firm chair that has side arms. You can use this for support while you get dressed. Do not have throw rugs and other things on the floor that can make you trip. What can I do in the kitchen? Clean up any spills right away. Avoid walking on wet floors. Keep items that you use a lot in easy-to-reach places. If you need to reach something above you, use a strong step stool that has a grab bar. Keep electrical cords out of the way. Do not use floor polish or wax that makes floors slippery. If you must use wax, use non-skid floor wax. Do not have throw rugs and other things on the floor that can make you trip. What can I do with my stairs? Do not leave any items on the stairs. Make sure that there are handrails on both sides of the stairs and use them. Fix handrails that are broken or loose. Make sure that handrails are as long as the stairways. Check any carpeting to  make sure that it is firmly attached to the stairs. Fix any carpet that is loose or worn. Avoid having throw rugs at the top or bottom of the stairs. If you do have throw rugs, attach them to the floor with carpet tape. Make sure that you have a light switch at the top of the stairs and the bottom of the stairs. If you do not have them, ask someone to add them for you. What else can I do to help prevent falls? Wear shoes that: Do not have high heels. Have rubber bottoms. Are comfortable and fit you well. Are closed at the toe. Do not wear sandals. If you use a stepladder: Make sure that it is fully opened. Do not climb a closed stepladder. Make sure that both sides of the stepladder are locked into place. Ask someone to hold it for you, if possible. Clearly mark and make sure that you can see: Any grab bars or handrails. First and last steps. Where the edge of each step is. Use tools that help you move around (mobility aids) if they are needed. These include: Canes. Walkers. Scooters. Crutches. Turn on the lights when you go into a dark area. Replace any light bulbs as soon as they burn out. Set up your furniture so you have a clear path. Avoid moving your furniture around. If any of your floors are uneven, fix them. If there are any pets around you, be aware of where they are. Review your medicines with your doctor. Some medicines can make you feel dizzy. This can increase your chance of falling. Ask your doctor what other things that you can do to help prevent falls. This information is not intended to replace advice given to you by your health care provider. Make sure you discuss any questions you have with your health care provider. Document Released: 12/28/2008 Document  Revised: 08/09/2015 Document Reviewed: 04/07/2014 Elsevier Interactive Patient Education  2017 [employer].

## 2022-06-11 NOTE — Telephone Encounter (Signed)
Updated patient on his labs. He does not need to restart iron at this time

## 2022-06-16 ENCOUNTER — Other Ambulatory Visit: Payer: Self-pay | Admitting: Cardiology

## 2022-06-16 ENCOUNTER — Other Ambulatory Visit: Payer: Self-pay | Admitting: Family Medicine

## 2022-06-19 ENCOUNTER — Encounter: Payer: Self-pay | Admitting: Adult Health

## 2022-06-19 NOTE — Telephone Encounter (Signed)
Please advise 

## 2022-06-25 ENCOUNTER — Encounter: Payer: Self-pay | Admitting: Adult Health

## 2022-07-14 ENCOUNTER — Other Ambulatory Visit: Payer: Self-pay | Admitting: Family Medicine

## 2022-08-04 ENCOUNTER — Encounter: Payer: Self-pay | Admitting: Internal Medicine

## 2022-08-04 ENCOUNTER — Ambulatory Visit (INDEPENDENT_AMBULATORY_CARE_PROVIDER_SITE_OTHER): Payer: Medicare Other | Admitting: Internal Medicine

## 2022-08-04 VITALS — BP 102/68 | HR 70 | Temp 98.5°F | Wt 208.2 lb

## 2022-08-04 DIAGNOSIS — J069 Acute upper respiratory infection, unspecified: Secondary | ICD-10-CM | POA: Diagnosis not present

## 2022-08-04 NOTE — Progress Notes (Signed)
Established Patient Office Visit     CC/Reason for Visit: URI symptoms  HPI: Peter Snyder is a 67 y.o. male who is coming in today for the above mentioned reasons. For about 1 week has been having worsening nasal congestion, itchy eyes and mild sore throat. Has noted submandibular LAD as well.   Past Medical/Surgical History: Past Medical History:  Diagnosis Date   Allergy    Allergy to sulfa drugs 12/10/2020   Arthritis    CAD (coronary artery disease) 6/14   Cardiac catheterization 6/27/ 2014 ejection fraction 35-40%, 30% proximal left circumflex, 100% tiny obtuse marginal 1 with collaterals, 50% LAD, 50% D1, 100% RCA with collaterals   Chicken pox    Chronic back pain    Colon polyps    Depression    Diabetes mellitus (HCC)    Diet controlled- OFF metformin 02-01-20   E coli infection 12/10/2020   Folliculitis 12/10/2020   GERD (gastroesophageal reflux disease)    Hyperlipidemia    Hypertension    Kidney stone    Myocardial infarction [facility])    ? year- cath 2014/ june     Past Surgical History:  Procedure Laterality Date   COLONOSCOPY     CRYO INTERCOSTAL NERVE BLOCK     double sacrum nerve block   epidural injections     multiple- L5 S1 and cervical neck pain issues as well    POLYPECTOMY     TONSILLECTOMY  1963   TRIGGER FINGER RELEASE  2019    Social History:  reports that he quit smoking about 24 years ago. His smoking use included cigarettes. He started smoking about 52 years ago. He has a 7.00 pack-year smoking history. He has never used smokeless tobacco. He reports that he does not drink alcohol and does not use drugs.  Allergies: Allergies  Allergen Reactions   Doxycycline Hyclate Nausea And Vomiting   Latex Other (See Comments)    Other   Tape     Use Paper Tape    Sulfa Antibiotics Rash    Family History:  Family History  Problem Relation Age of Onset   Melanoma Mother        metastatic   Cancer Mother        melanoma   Hypertension  Mother    Arthritis Father    Colon polyps Father    COPD Father    Hyperlipidemia Maternal Grandmother    Heart disease Maternal Grandmother    Arthritis Paternal Grandmother    Clotting disorder Paternal Grandmother    Clotting disorder Paternal Grandfather    Liver cancer Paternal Grandfather    Colon cancer Neg Hx    Esophageal cancer Neg Hx    Rectal cancer Neg Hx    Stomach cancer Neg Hx      Current Outpatient Medications:    acetaminophen (TYLENOL) 500 MG tablet, Take 500 mg by mouth every 6 (six) hours as needed for moderate pain., Disp: , Rfl:    ALPRAZolam (XANAX) 0.25 MG tablet, Take 0.25 mg by mouth daily as needed for anxiety., Disp: , Rfl:    aspirin 81 MG tablet, Take 81 mg by mouth daily., Disp: , Rfl:    atorvastatin (LIPITOR) 80 MG tablet, TAKE 1 TABLET BY MOUTH EVERY DAY, Disp: 90 tablet, Rfl: 1   Azelastine HCl 137 MCG/SPRAY SOLN, PLACE 1 SPRAY INTO BOTH NOSTRILS 2 TIMES DAILY AS DIRECTED, Disp: 90 mL, Rfl: 1   carvedilol (COREG) 12.5 MG tablet, TAKE  1 TABLET TWICE A DAY WITH A MEAL, Disp: 180 tablet, Rfl: 1   diclofenac Sodium (VOLTAREN) 1 % GEL, Apply 2 g topically daily as needed (for pain)., Disp: , Rfl:    escitalopram (LEXAPRO) 20 MG tablet, Take 20 mg by mouth at bedtime., Disp: , Rfl:    ezetimibe (ZETIA) 10 MG tablet, TAKE 1 TABLET BY MOUTH EVERY DAY, Disp: 90 tablet, Rfl: 1   gabapentin (NEURONTIN) 600 MG tablet, TAKE 1 TABLET BY MOUTH TWICE A DAY, Disp: 180 tablet, Rfl: 1   LOTEMAX 0.5 % GEL, SMARTSIG:1 Drop(s) In Eye(s) Twice Daily PRN, Disp: , Rfl:    pantoprazole (PROTONIX) 40 MG tablet, Take 1 tablet (40 mg total) by mouth 2 (two) times daily., Disp: 180 tablet, Rfl: 3   RESTASIS 0.05 % ophthalmic emulsion, Place 1 drop into both eyes 2 (two) times daily. , Disp: , Rfl:    sacubitril-valsartan (ENTRESTO) 49-51 MG, Take 1 tablet by mouth 2 (two) times daily., Disp: 180 tablet, Rfl: 3   tamsulosin (FLOMAX) 0.4 MG CAPS capsule, TAKE 1 CAPSULE EVERY DAY  AFTER SUPPER, Disp: 90 capsule, Rfl: 0   ULTRAM 50 MG tablet, Take 50 mg by mouth 3 (three) times daily as needed., Disp: , Rfl:    zolpidem (AMBIEN) 10 MG tablet, Take 10 mg by mouth at bedtime as needed for sleep., Disp: , Rfl:   Review of Systems:  Negative unless indicated in HPI.   Physical Exam: Vitals:   08/04/22 1127  BP: 102/68  Pulse: 70  Temp: 98.5 F (36.9 C)  TempSrc: Oral  SpO2: 99%  Weight: 208 lb 3.2 oz (94.4 kg)    Body mass index is 26.73 kg/m.   Physical Exam Vitals reviewed.  Constitutional:      Appearance: Normal appearance.  HENT:     Right Ear: Tympanic membrane, ear canal and external ear normal.     Left Ear: Tympanic membrane, ear canal and external ear normal.     Mouth/Throat:     Mouth: Mucous membranes are moist.     Pharynx: Oropharynx is clear.  Eyes:     Conjunctiva/sclera: Conjunctivae normal.     Pupils: Pupils are equal, round, and reactive to light.  Cardiovascular:     Rate and Rhythm: Normal rate and regular rhythm.  Pulmonary:     Effort: Pulmonary effort is normal.     Breath sounds: Normal breath sounds.  Lymphadenopathy:     Head:     Right side of head: Submandibular adenopathy present.     Left side of head: Submandibular adenopathy present.  Neurological:     Mental Status: He is alert.      Impression and Plan:  Viral upper respiratory tract infection  -Given exam findings, PNA, pharyngitis, ear infection are not likely, hence abx have not been prescribed. -Have advised rest, fluids, OTC antihistamines, cough suppressants and mucinex. -RTC if no improvement in 10-14 days.    Time spent:22 minutes reviewing chart, interviewing and examining patient and formulating plan of care.     Chaya Jan, MD [facility] at [facility]

## 2022-08-08 ENCOUNTER — Other Ambulatory Visit: Payer: Self-pay | Admitting: Family Medicine

## 2022-08-13 ENCOUNTER — Ambulatory Visit (HOSPITAL_COMMUNITY)
Admission: EM | Admit: 2022-08-13 | Discharge: 2022-08-13 | Disposition: A | Discharge status: Home / Self Care | Payer: Medicare Other | Attending: Nurse Practitioner | Admitting: Nurse Practitioner

## 2022-08-13 ENCOUNTER — Encounter (HOSPITAL_COMMUNITY): Payer: Self-pay

## 2022-08-13 DIAGNOSIS — K121 Other forms of stomatitis: Secondary | ICD-10-CM | POA: Diagnosis not present

## 2022-08-13 NOTE — ED Provider Notes (Signed)
[facility]    CSN: [id number] Arrival date & time: 08/13/22  1332      History   Chief Complaint Chief Complaint  Patient presents with   nodes and ulcers    HPI Peter Snyder is a 67 y.o. male.   HPI  He is in today for evaluation of mouth ulcer to the right with enlarged lymph node.  He reports that he was treated for URI with antihistamines.    He reports that these ulcers developed in the last few days.  He denies any change in his normal habits.  He does report that he had an ulcer in the past seen by his dentist and was prescribed with Magic mouthwash.  He reports there was some difficulty swallowing and last night.  He feels like this was after using the Magic mouthwash. Has not seen any specific results at this time.  Upon questioning he reports that he will visit the dentist on tomorrow.  He denies any fever, chills, shortness of breath, wheezing, chest pains. Past Medical History:  Diagnosis Date   Allergy    Allergy to sulfa drugs 12/10/2020   Arthritis    CAD (coronary artery disease) 6/14   Cardiac catheterization 6/27/ 2014 ejection fraction 35-40%, 30% proximal left circumflex, 100% tiny obtuse marginal 1 with collaterals, 50% LAD, 50% D1, 100% RCA with collaterals   Chicken pox    Chronic back pain    Colon polyps    Depression    Diabetes mellitus (HCC)    Diet controlled- OFF metformin 02-01-20   E coli infection 12/10/2020   Folliculitis 12/10/2020   GERD (gastroesophageal reflux disease)    Hyperlipidemia    Hypertension    Kidney stone    Myocardial infarction [facility])    ? year- cath 2014/ june     Patient Active Problem List   Diagnosis Date Noted   Allergy to sulfa drugs 12/10/2020   Allergic rhinitis 08/03/2020   Type 2 diabetes mellitus with hyperglycemia (HCC) 07/11/2019   Pes anserinus bursitis of left knee 05/20/2018   Pain in left knee 02/26/2018   DDD (degenerative disc disease), cervical 06/02/2017   Degeneration of lumbar  intervertebral disc 05/25/2017   Viral URI 03/16/2015   Chronic insomnia 03/14/2014   Peripheral neuropathy 09/02/2013   Cardiomyopathy, ischemic 01/18/2013   CAD (coronary artery disease) 12/29/2012   GERD (gastroesophageal reflux disease) 12/29/2012   Essential hypertension, benign 12/29/2012   Dyslipidemia 12/29/2012   Personal history of colonic polyps 12/29/2012   Kidney stones 12/29/2012   Depression 12/29/2012   Degenerative arthritis of lumbar spine 12/29/2012   BPH (benign prostatic hyperplasia) 12/29/2012   Essential tremor 12/29/2012    Past Surgical History:  Procedure Laterality Date   COLONOSCOPY     CRYO INTERCOSTAL NERVE BLOCK     double sacrum nerve block   epidural injections     multiple- L5 S1 and cervical neck pain issues as well    POLYPECTOMY     TONSILLECTOMY  1963   TRIGGER FINGER RELEASE  2019       Home Medications    Prior to Admission medications   Medication Sig Start Date End Date Taking? Authorizing Provider  acetaminophen (TYLENOL) 500 MG tablet Take 500 mg by mouth every 6 (six) hours as needed for moderate pain.    [provider]  ALPRAZolam Prudy Feeler) 0.25 MG tablet Take 0.25 mg by mouth daily as needed for anxiety.    [provider]  aspirin 81 MG tablet Take 81 mg by mouth daily.    [provider]  atorvastatin (LIPITOR) 80 MG tablet TAKE 1 TABLET BY MOUTH EVERY DAY 07/15/22   Burchette, Elberta Fortis, MD  Azelastine HCl 137 MCG/SPRAY SOLN PLACE 1 SPRAY INTO BOTH NOSTRILS 2 TIMES DAILY AS DIRECTED 06/02/22   Burchette, Elberta Fortis, MD  carvedilol (COREG) 12.5 MG tablet TAKE 1 TABLET TWICE A DAY WITH A MEAL 06/17/22   Burchette, Elberta Fortis, MD  diclofenac Sodium (VOLTAREN) 1 % GEL Apply 2 g topically daily as needed (for pain).    [provider]  escitalopram (LEXAPRO) 20 MG tablet Take 20 mg by mouth at bedtime.    [provider]  ezetimibe (ZETIA) 10 MG tablet TAKE 1 TABLET BY MOUTH EVERY DAY 06/17/22    Burchette, Elberta Fortis, MD  gabapentin (NEURONTIN) 600 MG tablet TAKE 1 TABLET BY MOUTH TWICE A DAY 03/03/22   Burchette, Elberta Fortis, MD  LOTEMAX 0.5 % GEL SMARTSIG:1 Drop(s) In Eye(s) Twice Daily PRN 12/06/18   [provider]  pantoprazole (PROTONIX) 40 MG tablet Take 1 tablet (40 mg total) by mouth 2 (two) times daily. 05/19/22   Burchette, Elberta Fortis, MD  RESTASIS 0.05 % ophthalmic emulsion Place 1 drop into both eyes 2 (two) times daily.  11/20/12   [provider]  sacubitril-valsartan (ENTRESTO) 49-51 MG Take 1 tablet by mouth 2 (two) times daily. 05/22/22   Lewayne Bunting, MD  tamsulosin (FLOMAX) 0.4 MG CAPS capsule TAKE 1 CAPSULE EVERY DAY AFTER SUPPER 08/08/22   Burchette, Elberta Fortis, MD  ULTRAM 50 MG tablet Take 50 mg by mouth 3 (three) times daily as needed. 03/24/22   [provider]  zolpidem (AMBIEN) 10 MG tablet Take 10 mg by mouth at bedtime as needed for sleep.    [provider]    Family History Family History  Problem Relation Age of Onset   Melanoma Mother        metastatic   Cancer Mother        melanoma   Hypertension Mother    Arthritis Father    Colon polyps Father    COPD Father    Hyperlipidemia Maternal Grandmother    Heart disease Maternal Grandmother    Arthritis Paternal Grandmother    Clotting disorder Paternal Grandmother    Clotting disorder Paternal Grandfather    Liver cancer Paternal Grandfather    Colon cancer Neg Hx    Esophageal cancer Neg Hx    Rectal cancer Neg Hx    Stomach cancer Neg Hx     Social History Social History   Tobacco Use   Smoking status: Former    Packs/day: 0.25    Years: 28.00    Additional pack years: 0.00    Total pack years: 7.00    Types: Cigarettes    Start date: 25    Quit date: 2000    Years since quitting: 24.4   Smokeless tobacco: Never  Vaping Use   Vaping Use: Never used  Substance Use Topics   Alcohol use: No    Alcohol/week: 0.0 standard drinks of alcohol   Drug use: No      Allergies   Doxycycline hyclate, Latex, Tape, and Sulfa antibiotics   Review of Systems Review of Systems   Physical Exam Triage Vital Signs ED Triage Vitals  Enc Vitals Group     BP 08/13/22 1354 128/80     Pulse Rate 08/13/22 1354 75  Resp 08/13/22 1354 16     Temp 08/13/22 1354 98.5 F (36.9 C)     Temp Source 08/13/22 1354 Oral     SpO2 08/13/22 1354 97 %     Weight --      Height --      Head Circumference --      Peak Flow --      Pain Score 08/13/22 1353 6     Pain Loc --      Pain Edu? --      Excl. in GC? --    No data found.  Updated Vital Signs BP 128/80 (BP Location: Right Arm)   Pulse 75   Temp 98.5 F (36.9 C) (Oral)   Resp 16   SpO2 97%   Visual Acuity Right Eye Distance:   Left Eye Distance:   Bilateral Distance:    Right Eye Near:   Left Eye Near:    Bilateral Near:     Physical Exam Constitutional:      General: He is not in acute distress.    Appearance: He is normal weight.  HENT:     Head: Normocephalic.     Nose: Nose normal. No congestion or rhinorrhea.     Mouth/Throat:     Pharynx: No oropharyngeal exudate or posterior oropharyngeal erythema.     Comments: Small ulcer to tongue and sub Plilte  Cardiovascular:     Rate and Rhythm: Normal rate.  Pulmonary:     Effort: Pulmonary effort is normal.  Neurological:     Mental Status: He is alert.      UC Treatments / Results  Labs (all labs ordered are listed, but only abnormal results are displayed) Labs Reviewed  AEROBIC CULTURE W GRAM STAIN (SUPERFICIAL SPECIMEN)    EKG   Radiology No results found.  Procedures Procedures (including critical care time)  Medications Ordered in UC Medications - No data to display  Initial Impression / Assessment and Plan / UC Course  I have reviewed the triage vital signs and the nursing notes.  Pertinent labs & imaging results that were available during my care of the patient were reviewed by me and considered in  my medical decision making (see chart for details).     Oral ulcer Final Clinical Impressions(s) / UC Diagnoses   Final diagnoses:  Oral ulcer     Discharge Instructions      You have an oral culture that is pending.  If there are any positive results you will receive a call from the nurse with next steps and treatment recommendations.  Currently the recommendation is to continue with the Magic mouthwash as directed by your dentist.     ED Prescriptions   None    PDMP not reviewed this encounter.   Thad Ranger Coats, [place] 08/13/22 [phone]

## 2022-08-13 NOTE — ED Triage Notes (Signed)
Pt c/o a swollen gland in neck that pt had seen another [facility] provider for recently. Since that visit pt now has mouth ulcers.

## 2022-08-13 NOTE — Discharge Instructions (Addendum)
You have an oral culture that is pending.  If there are any positive results you will receive a call from the nurse with next steps and treatment recommendations.  Currently the recommendation is to continue with the Magic mouthwash as directed by your dentist.

## 2022-08-14 ENCOUNTER — Telehealth (HOSPITAL_COMMUNITY): Payer: Self-pay | Admitting: Internal Medicine

## 2022-08-14 ENCOUNTER — Ambulatory Visit: Payer: BLUE CROSS/BLUE SHIELD | Admitting: Family Medicine

## 2022-08-14 NOTE — Telephone Encounter (Signed)
HSV test re-ordered

## 2022-08-17 LAB — HSV DNA BY PCR (REFERENCE LAB)
HSV 1 DNA: NEGATIVE
HSV 2 DNA: NEGATIVE

## 2022-09-04 ENCOUNTER — Ambulatory Visit (INDEPENDENT_AMBULATORY_CARE_PROVIDER_SITE_OTHER): Payer: Medicare Other | Admitting: Family Medicine

## 2022-09-04 ENCOUNTER — Encounter: Payer: Self-pay | Admitting: Family Medicine

## 2022-09-04 VITALS — BP 110/72 | HR 69 | Temp 98.0°F | Wt 209.0 lb

## 2022-09-04 DIAGNOSIS — L723 Sebaceous cyst: Secondary | ICD-10-CM

## 2022-09-04 NOTE — Progress Notes (Signed)
   Subjective:    Patient ID: Peter Snyder, male    DOB: [birth year], 67 y.o.   MRN: [id number]  HPI Here for a sharp pain he feels in the back of his head that started 4 days ago. He says this began after he had a Facetime session with his grandchildren that lasted 1.5 hours. During this he was lying on his back wht a towel bunched up under his head. Otherwise he feels fine.    Review of Systems  Constitutional: Negative.   Eyes: Negative.   Respiratory: Negative.    Cardiovascular: Negative.   Neurological:  Positive for headaches. Negative for dizziness, tremors, seizures, syncope, facial asymmetry, speech difficulty, weakness, light-headedness and numbness.       Objective:   Physical Exam Constitutional:      General: He is not in acute distress.    Appearance: Normal appearance.  Cardiovascular:     Rate and Rhythm: Normal rate and regular rhythm.     Pulses: Normal pulses.     Heart sounds: Normal heart sounds.  Pulmonary:     Effort: Pulmonary effort is normal.     Breath sounds: Normal breath sounds.  Skin:    Comments: There is a tender round mobile firm lump under the skin on the right occipital scalp   Neurological:     Mental Status: He is alert.           Assessment & Plan:  This is a benign sebaceous cyst that has become inflamed. I reassured him this should resolve over the next week or so. Avoid wearing tight hats or putting pressure on the area. Recheck as needed.  Gershon Crane, MD

## 2022-09-08 ENCOUNTER — Encounter: Payer: Self-pay | Admitting: Family Medicine

## 2022-09-08 ENCOUNTER — Ambulatory Visit (INDEPENDENT_AMBULATORY_CARE_PROVIDER_SITE_OTHER): Payer: Medicare Other | Admitting: Family Medicine

## 2022-09-08 VITALS — BP 104/60 | HR 75 | Temp 98.3°F | Ht 74.0 in | Wt 209.3 lb

## 2022-09-08 DIAGNOSIS — R519 Headache, unspecified: Secondary | ICD-10-CM

## 2022-09-08 DIAGNOSIS — E1165 Type 2 diabetes mellitus with hyperglycemia: Secondary | ICD-10-CM | POA: Diagnosis not present

## 2022-09-08 NOTE — Progress Notes (Signed)
Established Patient Office Visit  Subjective   Patient ID: Peter Snyder, male    DOB: Apr 24, 1955  Age: 67 y.o. MRN: [id number]  Chief Complaint  Patient presents with   Headache    HPI   Rakan is seen with persistent sore area to touch occipital scalp.  Recent history is that about a week ago he was on a FaceTime call with grandchildren and had a towel bunched up and pointed that towel was pushing against the area on his scalp but is now tender to palpation.  He was seen here and felt to have inflamed sebaceous cyst.  He had area checked by his neighbor who is dermatologist who did not confirm any cyst. Denies any exertional headache.  Area of scalp is sore to touch.  He has not palpated any swelling.  Denies any other head injury.  No fever.  He did have recent A1c of 6.8%.  However, he has had several steroid injections in recent months including couple in the past several weeks.  We have recommended repeating his A1c but preferably after he has not had any injections for some time (ie 2-3 months).    Past Medical History:  Diagnosis Date   Allergy    Allergy to sulfa drugs 12/10/2020   Arthritis    CAD (coronary artery disease) 6/14   Cardiac catheterization 6/27/ 2014 ejection fraction 35-40%, 30% proximal left circumflex, 100% tiny obtuse marginal 1 with collaterals, 50% LAD, 50% D1, 100% RCA with collaterals   Chicken pox    Chronic back pain    Colon polyps    Depression    Diabetes mellitus (HCC)    Diet controlled- OFF metformin 02-01-20   E coli infection 12/10/2020   Folliculitis 12/10/2020   GERD (gastroesophageal reflux disease)    Hyperlipidemia    Hypertension    Kidney stone    Myocardial infarction [facility])    ? year- cath 2014/ june    Past Surgical History:  Procedure Laterality Date   COLONOSCOPY     CRYO INTERCOSTAL NERVE BLOCK     double sacrum nerve block   epidural injections     multiple- L5 S1 and cervical neck pain issues as well    POLYPECTOMY      TONSILLECTOMY  1963   TRIGGER FINGER RELEASE  2019    reports that he quit smoking about 24 years ago. His smoking use included cigarettes. He started smoking about 52 years ago. He has a 7.00 pack-year smoking history. He has never used smokeless tobacco. He reports that he does not drink alcohol and does not use drugs. family history includes Arthritis in his father and paternal grandmother; COPD in his father; Cancer in his mother; Clotting disorder in his paternal grandfather and paternal grandmother; Colon polyps in his father; Heart disease in his maternal grandmother; Hyperlipidemia in his maternal grandmother; Hypertension in his mother; Liver cancer in his paternal grandfather; Melanoma in his mother. Allergies  Allergen Reactions   Doxycycline Hyclate Nausea And Vomiting   Latex Other (See Comments)    Other   Tape     Use Paper Tape    Sulfa Antibiotics Rash    Review of Systems  Constitutional:  Negative for chills and fever.  Eyes:  Negative for blurred vision.  Neurological:  Negative for dizziness and focal weakness.      Objective:     BP 104/60 (BP Location: Left Arm, Patient Position: Sitting, Cuff Size: Normal)   Pulse 75  Temp 98.3 F (36.8 C) (Oral)   Ht 6\' 2"  (1.88 m)   Wt 209 lb 4.8 oz (94.9 kg)   SpO2 98%   BMI 26.87 kg/m    Physical Exam Vitals reviewed.  Constitutional:      General: He is not in acute distress.    Appearance: He is well-developed.  HENT:     Head: Normocephalic and atraumatic.     Comments: Scalp was examined.  He has approximately quarter sized area of tenderness palpation to the mid occipital region.  No erythema.  No rash.  No lesions. Cardiovascular:     Rate and Rhythm: Normal rate and regular rhythm.  Pulmonary:     Effort: Pulmonary effort is normal.     Breath sounds: Normal breath sounds. No wheezing or rales.  Neurological:     Mental Status: He is alert.      No results found for any visits on  09/08/22.    The 10-year ASCVD risk score (Arnett DK, et al., 2019) is: 18.9%    Assessment & Plan:   Occipital pain.  This appears to be very localized and superficial.  This started after compression against the point of a towel after laying in 1 position for about an hour and a half.  Exam reassuring.  Observe for now.  Be in touch if this is not resolving over the next couple of weeks  Type 2 diabetes with recent A1c 6.8%.  Probably exacerbated by recent steroid injections.  Recommend repeat A1c within the next couple months Continue regular exercise habits and low glycemic diet.   Evelena Peat, MD

## 2022-09-08 NOTE — Patient Instructions (Signed)
Let's consider follow up PSA and A1C within the next few months.

## 2022-09-14 ENCOUNTER — Ambulatory Visit (HOSPITAL_COMMUNITY)
Admission: EM | Admit: 2022-09-14 | Discharge: 2022-09-14 | Disposition: A | Discharge status: Home / Self Care | Payer: Medicare Other | Attending: Emergency Medicine | Admitting: Emergency Medicine

## 2022-09-14 ENCOUNTER — Encounter (HOSPITAL_COMMUNITY): Payer: Self-pay

## 2022-09-14 DIAGNOSIS — R040 Epistaxis: Secondary | ICD-10-CM | POA: Diagnosis not present

## 2022-09-14 DIAGNOSIS — J309 Allergic rhinitis, unspecified: Secondary | ICD-10-CM

## 2022-09-14 MED ORDER — MOMETASONE FUROATE 50 MCG/ACT NA SUSP: 2.0000 | Freq: Every day | NASAL | 0 refills | Status: DC

## 2022-09-14 MED ORDER — SILVER NITRATE-POT NITRATE 75-25 % EX MISC
CUTANEOUS | Status: AC
  Filled 2022-09-14: qty 10

## 2022-09-14 NOTE — ED Triage Notes (Signed)
Pt c/o a nose bleed x 2. Pt takes Flonase daily.

## 2022-09-14 NOTE — ED Provider Notes (Signed)
HPI  SUBJECTIVE:  Peter Snyder is a 67 y.o. male who presents with 2 episodes of right-sided epistaxis after sneezing.  He reports bloody mucus last night, and bright red blood this morning.  He was able to tamponade the epistaxis with Kleenex and direct pressure but is here for possible cautery.  No sinus pain or pressure, digital trauma, blood in the back of his throat.  No chest pain, shortness of breath, dizziness, lightheadedness.  He tried saline spray, Kleenex, acquired nasal gel.  The Kleenex help stop the bleeding.  Symptoms are worse when he sneezes.  Of note, he has been on Astelin and Flonase for a long time for allergic rhinitis.  Patient has known extensive medical history including coronary disease, MI, hyperlipidemia, hypertension, diabetes.  He is on aspirin 81 mg daily.  He is not on any other anticoagulants or antiplatelet.  PCP: [facility].  Past Medical History:  Diagnosis Date   Allergy    Allergy to sulfa drugs 12/10/2020   Arthritis    CAD (coronary artery disease) 6/14   Cardiac catheterization 6/27/ 2014 ejection fraction 35-40%, 30% proximal left circumflex, 100% tiny obtuse marginal 1 with collaterals, 50% LAD, 50% D1, 100% RCA with collaterals   Chicken pox    Chronic back pain    Colon polyps    Depression    Diabetes mellitus (HCC)    Diet controlled- OFF metformin 02-01-20   E coli infection 12/10/2020   Folliculitis 12/10/2020   GERD (gastroesophageal reflux disease)    Hyperlipidemia    Hypertension    Kidney stone    Myocardial infarction [id number])    ? year- cath 2014/ june     Past Surgical History:  Procedure Laterality Date   COLONOSCOPY     CRYO INTERCOSTAL NERVE BLOCK     double sacrum nerve block   epidural injections     multiple- L5 S1 and cervical neck pain issues as well    POLYPECTOMY     TONSILLECTOMY  1963   TRIGGER FINGER RELEASE  2019    Family History  Problem Relation Age of Onset   Melanoma Mother        metastatic    Cancer Mother        melanoma   Hypertension Mother    Arthritis Father    Colon polyps Father    COPD Father    Hyperlipidemia Maternal Grandmother    Heart disease Maternal Grandmother    Arthritis Paternal Grandmother    Clotting disorder Paternal Grandmother    Clotting disorder Paternal Grandfather    Liver cancer Paternal Grandfather    Colon cancer Neg Hx    Esophageal cancer Neg Hx    Rectal cancer Neg Hx    Stomach cancer Neg Hx     Social History   Tobacco Use   Smoking status: Former    Packs/day: 0.25    Years: 28.00    Additional pack years: 0.00    Total pack years: 7.00    Types: Cigarettes    Start date: 33    Quit date: 2000    Years since quitting: 24.5   Smokeless tobacco: Never  Vaping Use   Vaping Use: Never used  Substance Use Topics   Alcohol use: No    Alcohol/week: 0.0 standard drinks of alcohol   Drug use: No    No current facility-administered medications for this encounter.  Current Outpatient Medications:    acetaminophen (TYLENOL) 500 MG tablet, Take  500 mg by mouth every 6 (six) hours as needed for moderate pain., Disp: , Rfl:    ALPRAZolam (XANAX) 0.25 MG tablet, Take 0.25 mg by mouth daily as needed for anxiety., Disp: , Rfl:    aspirin 81 MG tablet, Take 81 mg by mouth daily., Disp: , Rfl:    atorvastatin (LIPITOR) 80 MG tablet, TAKE 1 TABLET BY MOUTH EVERY DAY, Disp: 90 tablet, Rfl: 1   Azelastine HCl 137 MCG/SPRAY SOLN, PLACE 1 SPRAY INTO BOTH NOSTRILS 2 TIMES DAILY AS DIRECTED, Disp: 90 mL, Rfl: 1   carvedilol (COREG) 12.5 MG tablet, TAKE 1 TABLET TWICE A DAY WITH A MEAL, Disp: 180 tablet, Rfl: 1   diclofenac Sodium (VOLTAREN) 1 % GEL, Apply 2 g topically daily as needed (for pain)., Disp: , Rfl:    escitalopram (LEXAPRO) 20 MG tablet, Take 20 mg by mouth at bedtime., Disp: , Rfl:    ezetimibe (ZETIA) 10 MG tablet, TAKE 1 TABLET BY MOUTH EVERY DAY, Disp: 90 tablet, Rfl: 1   gabapentin (NEURONTIN) 600 MG tablet, TAKE 1 TABLET  BY MOUTH TWICE A DAY, Disp: 180 tablet, Rfl: 1   LOTEMAX 0.5 % GEL, SMARTSIG:1 Drop(s) In Eye(s) Twice Daily PRN, Disp: , Rfl:    mometasone (NASONEX) 50 MCG/ACT nasal spray, Place 2 sprays into the nose daily., Disp: 17 g, Rfl: 0   pantoprazole (PROTONIX) 40 MG tablet, Take 1 tablet (40 mg total) by mouth 2 (two) times daily., Disp: 180 tablet, Rfl: 3   sacubitril-valsartan (ENTRESTO) 49-51 MG, Take 1 tablet by mouth 2 (two) times daily., Disp: 180 tablet, Rfl: 3   tamsulosin (FLOMAX) 0.4 MG CAPS capsule, TAKE 1 CAPSULE EVERY DAY AFTER SUPPER, Disp: 90 capsule, Rfl: 1   ULTRAM 50 MG tablet, Take 50 mg by mouth 3 (three) times daily as needed., Disp: , Rfl:    zolpidem (AMBIEN) 10 MG tablet, Take 10 mg by mouth at bedtime as needed for sleep., Disp: , Rfl:    RESTASIS 0.05 % ophthalmic emulsion, Place 1 drop into both eyes 2 (two) times daily. , Disp: , Rfl:   Allergies  Allergen Reactions   Doxycycline Hyclate Nausea And Vomiting   Latex Other (See Comments)    Other   Tape     Use Paper Tape    Sulfa Antibiotics Rash     ROS  As noted in HPI.   Physical Exam  BP 127/81 (BP Location: Left Arm)   Pulse 69   Temp 98.3 F (36.8 C) (Oral)   Resp 18   SpO2 96%   Constitutional: Well developed, well nourished, no acute distress Eyes:  EOMI, conjunctiva normal bilaterally HENT: Normocephalic, atraumatic,mucus membranes moist.  Friable nasal mucosa worse on the right.  Blood spots on right septum.  No active bleeding.  No nasal congestion.  No sinus tenderness. Respiratory: Normal inspiratory effort Cardiovascular: Normal rate GI: nondistended skin: No rash, skin intact Musculoskeletal: no deformities Neurologic: Alert & oriented x 3, no focal neuro deficits Psychiatric: Speech and behavior appropriate   ED Course   Medications - No data to display  No orders of the defined types were placed in this encounter.   No results found. However, due to the size of the patient  record, not all encounters were searched. Please check Results Review for a complete set of results. No results found.  ED Clinical Impression  1. Right-sided epistaxis   2. Allergic rhinitis, unspecified seasonality, unspecified trigger      ED  Assessment/Plan     Suspect Flonase causing the anterior right-sided epistaxis as it is alcohol-based.  Will switch from Flonase to Nasonex.  Procedure note: After obtaining verbal consent, using silver nitrate and chemically cauterized the right nasal septum with hemostasis.  Tolerated procedure well.    Advised patient to leave this alone for the next 24 hours, then he can start saline nasal irrigation or microcurrent nasal gel, advised bacitracin or Vaseline, will switch him from Flonase to Nasonex.  Afrin if the bleeding recurs and does not stop with pressure.  Follow-up with PCP as needed.  Discussed MDM, treatment plan, and plan for follow-up with patient.  patient agrees with plan.   Meds ordered this encounter  Medications   mometasone (NASONEX) 50 MCG/ACT nasal spray    Sig: Place 2 sprays into the nose daily.    Dispense:  17 g    Refill:  0      *This clinic note was created using Scientist, clinical (histocompatibility and immunogenetics). Therefore, there may be occasional mistakes despite careful proofreading.  ?    Domenick Gong, MD 09/14/22 1136

## 2022-09-14 NOTE — Discharge Instructions (Signed)
Leave this area alone for the next 24 hours.  I would suggest skipping exercising today.  Discontinue Flonase, start Nasonex instead.  You can use Aquacare nasal gel, bacitracin or Vaseline in your nose to help keep it moist.  Saline nasal irrigation will also help.  If it starts to bleed again and does not resolve after 10 to 15 minutes of direct pressure by squeezing the soft part of your nose, you can try Afrin.

## 2022-10-01 ENCOUNTER — Ambulatory Visit (INDEPENDENT_AMBULATORY_CARE_PROVIDER_SITE_OTHER): Payer: Medicare Other | Admitting: Family Medicine

## 2022-10-01 ENCOUNTER — Encounter: Payer: Self-pay | Admitting: Family Medicine

## 2022-10-01 VITALS — BP 124/60 | HR 70 | Temp 98.2°F | Ht 74.0 in | Wt 209.3 lb

## 2022-10-01 DIAGNOSIS — M79601 Pain in right arm: Secondary | ICD-10-CM

## 2022-10-01 DIAGNOSIS — E611 Iron deficiency: Secondary | ICD-10-CM

## 2022-10-01 NOTE — Progress Notes (Signed)
Established Patient Office Visit  Subjective   Patient ID: Peter Snyder, male    DOB: [birth year]  Age: 67 y.o. MRN: [id number]  Chief Complaint  Patient presents with   Medical Management of Chronic Issues    HPI   Peter Snyder has history of CAD, ischemic cardiomyopathy, hypertension, GERD, type 2 diabetes, essential tremor, degenerative arthritis involving multiple joints, kidney stones, and dyslipidemia.  Multiple musculoskeletal complaints.  Has seen orthopedist multiple times in the past year with multiple steroid injections.  Recently dealing particularly with some tendinitis and apparently partial muscle tear right forearm.  Is trying to avoid nonsteroidals.  Tylenol does not frequently help his joint pains.  Possible nausea with tramadol.  He also had history of some burning epigastric pains with Celebrex.  Still exercising most days of the week with combination of cycling and walking on the treadmill.  No recent chest pains.  History of iron deficiency.  Most recent ferritin just slightly low at 20 with normal hemoglobin.  He had difficulty tolerating oral iron in the past.  Last colonoscopy 2022 and had EGD last winter which was basically normal except for a few small fundic gland polyps.  Currently not taking any nonsteroidals  Past Medical History:  Diagnosis Date   Allergy    Allergy to sulfa drugs 12/10/2020   Arthritis    CAD (coronary artery disease) 6/14   Cardiac catheterization 6/27/ 2014 ejection fraction 35-40%, 30% proximal left circumflex, 100% tiny obtuse marginal 1 with collaterals, 50% LAD, 50% D1, 100% RCA with collaterals   Chicken pox    Chronic back pain    Colon polyps    Depression    Diabetes mellitus (HCC)    Diet controlled- OFF metformin 02-01-20   E coli infection 12/10/2020   Folliculitis 12/10/2020   GERD (gastroesophageal reflux disease)    Hyperlipidemia    Hypertension    Kidney stone    Myocardial infarction [facility])    ? year- cath 2014/ june     Past Surgical History:  Procedure Laterality Date   COLONOSCOPY     CRYO INTERCOSTAL NERVE BLOCK     double sacrum nerve block   epidural injections     multiple- L5 S1 and cervical neck pain issues as well    POLYPECTOMY     TONSILLECTOMY  1963   TRIGGER FINGER RELEASE  2019    reports that he quit smoking about 24 years ago. His smoking use included cigarettes. He started smoking about 52 years ago. He has a 7 pack-year smoking history. He has never used smokeless tobacco. He reports that he does not drink alcohol and does not use drugs. family history includes Arthritis in his father and paternal grandmother; COPD in his father; Cancer in his mother; Clotting disorder in his paternal grandfather and paternal grandmother; Colon polyps in his father; Heart disease in his maternal grandmother; Hyperlipidemia in his maternal grandmother; Hypertension in his mother; Liver cancer in his paternal grandfather; Melanoma in his mother. Allergies  Allergen Reactions   Doxycycline Hyclate Nausea And Vomiting   Latex Other (See Comments)    Other   Tape     Use Paper Tape    Sulfa Antibiotics Rash    Review of Systems  Constitutional:  Negative for malaise/fatigue and weight loss.  Respiratory:  Negative for shortness of breath.   Cardiovascular:  Negative for chest pain.  Gastrointestinal:  Negative for abdominal pain, blood in stool and melena.  Musculoskeletal:  Positive for joint  pain.      Objective:     BP 124/60 (BP Location: Left Arm, Patient Position: Sitting, Cuff Size: Normal)   Pulse 70   Temp 98.2 F (36.8 C) (Oral)   Ht 6\' 2"  (1.88 m)   Wt 209 lb 4.8 oz (94.9 kg)   SpO2 98%   BMI 26.87 kg/m  BP Readings from Last 3 Encounters:  10/01/22 124/60  09/14/22 127/81  09/08/22 104/60   Wt Readings from Last 3 Encounters:  10/01/22 209 lb 4.8 oz (94.9 kg)  09/08/22 209 lb 4.8 oz (94.9 kg)  09/04/22 209 lb (94.8 kg)      Physical Exam Vitals reviewed.   Constitutional:      Appearance: Normal appearance.  Cardiovascular:     Rate and Rhythm: Normal rate and regular rhythm.  Pulmonary:     Effort: Pulmonary effort is normal.     Breath sounds: Normal breath sounds.  Musculoskeletal:     Right lower leg: No edema.     Left lower leg: No edema.  Neurological:     Mental Status: He is alert.      No results found for any visits on 10/01/22.  Last CBC Lab Results  Component Value Date   WBC 5.9 06/10/2022   HGB 13.5 06/10/2022   HCT 39.8 06/10/2022   MCV 92.2 06/10/2022   MCH 30.6 12/18/2021   RDW 13.0 06/10/2022   PLT 138.0 (L) 06/10/2022   Last metabolic panel Lab Results  Component Value Date   GLUCOSE 111 (H) 12/18/2021   NA 134 (L) 12/18/2021   K 4.0 12/18/2021   CL 101 12/18/2021   CO2 26 12/18/2021   BUN 17 12/18/2021   CREATININE 0.91 12/18/2021   GFRNONAA >60 12/18/2021   CALCIUM 9.0 12/18/2021   PROT 6.7 05/02/2022   ALBUMIN 4.2 05/02/2022   BILITOT 0.6 05/02/2022   ALKPHOS 49 05/02/2022   AST 17 05/02/2022   ALT 16 05/02/2022   ANIONGAP 7 12/18/2021   Last lipids Lab Results  Component Value Date   CHOL 131 05/02/2022   HDL 41.20 05/02/2022   LDLCALC 66 05/02/2022   LDLDIRECT 70.0 03/20/2016   TRIG 116.0 05/02/2022   CHOLHDL 3 05/02/2022   Last hemoglobin A1c Lab Results  Component Value Date   HGBA1C 6.8 (H) 05/02/2022      The 10-year ASCVD risk score (Arnett DK, et al., 2019) is: 25%    Assessment & Plan:   #1 multiple musculoskeletal complaints.  Currently battling with some tendinitis right elbow and recently diagnosed with ganglion in finger of the hand.  Continue follow-up with Ortho and avoid nonsteroidals as much as possible  #2 history of mild iron deficiency with recent ferritin of 20 and normal hemoglobin.  Possibly related to his chronic PPI use and history of nonsteroidals.  Consider check tissue transglutaminase with next labs  Peter Peat, MD

## 2022-10-16 ENCOUNTER — Other Ambulatory Visit: Payer: Self-pay | Admitting: Family Medicine

## 2022-10-22 NOTE — Progress Notes (Signed)
HPI: FU coronary artery disease. Previous cardiac care in [place]. Cardiac catheterization performed in June of 2014 showed an ejection fraction of 35-40%. The inferior wall is akinetic. The left main was normal. There was a 30% proximal circumflex, occluded small first obtuse marginal with collaterals, a 50% ramus intermedius, 50% LAD, 50% first diagonal and an occluded right coronary artery with collaterals. Treated medically. Nuclear study January 2017 showed ejection fraction 35%. There was a prior inferior infarct with mild peri-infarct ischemia. Cardiac MRI May 2017 showed akinetic inferior wall with ejection fraction 40%. Biatrial enlargement and mild aortic/mitral regurgitation. Abdominal ultrasound December 2020 showed no aneurysm.  MRA March 2023 showed 4.2 cm ascending aortic aneurysm.  Echocardiogram repeated August 2023 and showed hypokinesis of the basal inferior and inferolateral wall with overall preserved LV function, mild left ventricular hypertrophy, grade 1 diastolic dysfunction, moderate left atrial enlargement, mild mitral regurgitation, trace aortic insufficiency.  MRA of the thoracic aorta March 2024 showed ectasia of the ascending thoracic aorta at 3.9 cm.  Since last seen, the patient denies any dyspnea on exertion, orthopnea, PND, pedal edema, palpitations, syncope or chest pain.   Current Outpatient Medications  Medication Sig Dispense Refill   acetaminophen (TYLENOL) 500 MG tablet Take 500 mg by mouth every 6 (six) hours as needed for moderate pain.     ALPRAZolam (XANAX) 0.25 MG tablet Take 0.25 mg by mouth daily as needed for anxiety.     aspirin 81 MG tablet Take 81 mg by mouth daily.     atorvastatin (LIPITOR) 80 MG tablet TAKE 1 TABLET BY MOUTH EVERY DAY 90 tablet 1   Azelastine HCl 137 MCG/SPRAY SOLN PLACE 1 SPRAY INTO BOTH NOSTRILS 2 TIMES DAILY AS DIRECTED 90 mL 1   carvedilol (COREG) 12.5 MG tablet TAKE 1 TABLET TWICE A DAY WITH A MEAL 180 tablet 1    diclofenac Sodium (VOLTAREN) 1 % GEL Apply 2 g topically daily as needed (for pain).     escitalopram (LEXAPRO) 20 MG tablet Take 20 mg by mouth at bedtime.     ezetimibe (ZETIA) 10 MG tablet TAKE 1 TABLET BY MOUTH EVERY DAY 90 tablet 1   gabapentin (NEURONTIN) 600 MG tablet TAKE 1 TABLET BY MOUTH TWICE A DAY 180 tablet 1   LOTEMAX 0.5 % GEL SMARTSIG:1 Drop(s) In Eye(s) Twice Daily PRN     pantoprazole (PROTONIX) 40 MG tablet Take 1 tablet (40 mg total) by mouth 2 (two) times daily. 180 tablet 3   pimecrolimus (ELIDEL) 1 % cream 1 Application as needed.     RESTASIS 0.05 % ophthalmic emulsion Place 1 drop into both eyes 2 (two) times daily.      sacubitril-valsartan (ENTRESTO) 49-51 MG Take 1 tablet by mouth 2 (two) times daily. 180 tablet 3   tamsulosin (FLOMAX) 0.4 MG CAPS capsule TAKE 1 CAPSULE EVERY DAY AFTER SUPPER 90 capsule 1   ULTRAM 50 MG tablet Take 50 mg by mouth 3 (three) times daily as needed.     zolpidem (AMBIEN) 10 MG tablet Take 10 mg by mouth at bedtime as needed for sleep.     mometasone (NASONEX) 50 MCG/ACT nasal spray Place 2 sprays into the nose daily. (Patient not taking: Reported on 10/30/2022) 17 g 0   No current facility-administered medications for this visit.     Past Medical History:  Diagnosis Date   Allergy    Allergy to sulfa drugs 12/10/2020   Arthritis    CAD (coronary artery disease)  6/14   Cardiac catheterization 6/27/ 2014 ejection fraction 35-40%, 30% proximal left circumflex, 100% tiny obtuse marginal 1 with collaterals, 50% LAD, 50% D1, 100% RCA with collaterals   Chicken pox    Chronic back pain    Colon polyps    Depression    Diabetes mellitus (HCC)    Diet controlled- OFF metformin 02-01-20   E coli infection 12/10/2020   Folliculitis 12/10/2020   GERD (gastroesophageal reflux disease)    Hyperlipidemia    Hypertension    Kidney stone    Myocardial infarction [facility])    ? year- cath 2014/ june     Past Surgical History:  Procedure  Laterality Date   COLONOSCOPY     CRYO INTERCOSTAL NERVE BLOCK     double sacrum nerve block   epidural injections     multiple- L5 S1 and cervical neck pain issues as well    POLYPECTOMY     TONSILLECTOMY  1963   TRIGGER FINGER RELEASE  2019    Social History   Socioeconomic History   Marital status: Married    Spouse name: Not on file   Number of children: 3   Years of education: Not on file   Highest education level: Bachelor's degree (e.g., BA, AB, BS)  Occupational History   Occupation: retired    Comment: Retired  Tobacco Use   Smoking status: Former    Current packs/day: 0.00    Average packs/day: 0.3 packs/day for 28.0 years (7.0 ttl pk-yrs)    Types: Cigarettes    Start date: 27    Quit date: 2000    Years since quitting: 24.6   Smokeless tobacco: Never  Vaping Use   Vaping status: Never Used  Substance and Sexual Activity   Alcohol use: No    Alcohol/week: 0.0 standard drinks of alcohol   Drug use: No   Sexual activity: Not on file  Other Topics Concern   Not on file  Social History Narrative   Not on file   Social Determinants of Health   Financial Resource Strain: Low Risk  (06/11/2022)   Overall Financial Resource Strain (CARDIA)    Difficulty of Paying Living Expenses: Not hard at all  Food Insecurity: No Food Insecurity (06/11/2022)   Hunger Vital Sign    Worried About Running Out of Food in the Last Year: Never true    Ran Out of Food in the Last Year: Never true  Transportation Needs: No Transportation Needs (06/11/2022)   PRAPARE - Administrator, Civil Service (Medical): No    Lack of Transportation (Non-Medical): No  Physical Activity: Sufficiently Active (06/11/2022)   Exercise Vital Sign    Days of Exercise per Week: 5 days    Minutes of Exercise per Session: 50 min  Stress: No Stress Concern Present (06/11/2022)   [employer] of Occupational Health - Occupational Stress Questionnaire    Feeling of Stress : Not at all   Social Connections: Socially Integrated (06/11/2022)   Social Connection and Isolation Panel [NHANES]    Frequency of Communication with Friends and Family: More than three times a week    Frequency of Social Gatherings with Friends and Family: More than three times a week    Attends Religious Services: More than 4 times per year    Active Member of [employer] or Organizations: Yes    Attends Engineer, structural: More than 4 times per year    Marital Status: Married  Catering manager Violence:  Not At Risk (06/11/2022)   Humiliation, Afraid, Rape, and Kick questionnaire    Fear of Current or Ex-Partner: No    Emotionally Abused: No    Physically Abused: No    Sexually Abused: No    Family History  Problem Relation Age of Onset   Melanoma Mother        metastatic   Cancer Mother        melanoma   Hypertension Mother    Arthritis Father    Colon polyps Father    COPD Father    Hyperlipidemia Maternal Grandmother    Heart disease Maternal Grandmother    Arthritis Paternal Grandmother    Clotting disorder Paternal Grandmother    Clotting disorder Paternal Grandfather    Liver cancer Paternal Grandfather    Colon cancer Neg Hx    Esophageal cancer Neg Hx    Rectal cancer Neg Hx    Stomach cancer Neg Hx     ROS: no fevers or chills, productive cough, hemoptysis, dysphasia, odynophagia, melena, hematochezia, dysuria, hematuria, rash, seizure activity, orthopnea, PND, pedal edema, claudication. Remaining systems are negative.  Physical Exam: Well-developed well-nourished in no acute distress.  Skin is warm and dry.  HEENT is normal.  Neck is supple.  Chest is clear to auscultation with normal expansion.  Cardiovascular exam is regular rate and rhythm.  Abdominal exam nontender or distended. No masses palpated. Extremities show no edema. neuro grossly intact  EKG Interpretation Date/Time:  Thursday October 30 2022 08:47:50 EDT Ventricular Rate:  71 PR  Interval:  208 QRS Duration:  118 QT Interval:  388 QTC Calculation: 421 R Axis:   -23  Text Interpretation: Normal sinus rhythm Incomplete right bundle branch block Inferior infarct , age undetermined When compared with ECG of 18-Dec-2021 16:54, PREVIOUS ECG IS PRESENT Confirmed by Olga Millers ([id number]) on 10/30/2022 8:48:45 AM    A/P  1 coronary artery disease-patient continues to do well with no chest pain.  Continue aspirin and statin.  2 ischemic cardiomyopathy-LV function preserved on most recent echocardiogram.  Continue Entresto and carvedilol.  3 dilated aortic root-ectasia noted on recent MRA.  No change in size compared to 2017.  Will hold off on further imaging at this point.  4 hypertension-blood pressure controlled.  Continue present medications. Check bmet.  5 hyperlipidemia-continue Zetia and statin.  Olga Millers, MD

## 2022-10-27 ENCOUNTER — Other Ambulatory Visit: Payer: Self-pay

## 2022-10-27 ENCOUNTER — Encounter (HOSPITAL_BASED_OUTPATIENT_CLINIC_OR_DEPARTMENT_OTHER): Payer: Self-pay | Admitting: Emergency Medicine

## 2022-10-27 DIAGNOSIS — S81812A Laceration without foreign body, left lower leg, initial encounter: Secondary | ICD-10-CM | POA: Insufficient documentation

## 2022-10-27 DIAGNOSIS — Z91040 Latex allergy status: Secondary | ICD-10-CM | POA: Insufficient documentation

## 2022-10-27 DIAGNOSIS — W268XXA Contact with other sharp object(s), not elsewhere classified, initial encounter: Secondary | ICD-10-CM | POA: Insufficient documentation

## 2022-10-27 DIAGNOSIS — Z7982 Long term (current) use of aspirin: Secondary | ICD-10-CM | POA: Diagnosis not present

## 2022-10-27 DIAGNOSIS — S8992XA Unspecified injury of left lower leg, initial encounter: Secondary | ICD-10-CM | POA: Diagnosis present

## 2022-10-27 DIAGNOSIS — Z79899 Other long term (current) drug therapy: Secondary | ICD-10-CM | POA: Insufficient documentation

## 2022-10-27 DIAGNOSIS — Z23 Encounter for immunization: Secondary | ICD-10-CM | POA: Insufficient documentation

## 2022-10-27 NOTE — ED Triage Notes (Signed)
Pt c/o tripping over a seat and has lacerations on his left shin. Bandage in place at this time.

## 2022-10-28 ENCOUNTER — Emergency Department (HOSPITAL_BASED_OUTPATIENT_CLINIC_OR_DEPARTMENT_OTHER)
Admission: EM | Admit: 2022-10-28 | Discharge: 2022-10-28 | Disposition: A | Discharge status: Home / Self Care | Payer: Medicare Other | Attending: Emergency Medicine | Admitting: Emergency Medicine

## 2022-10-28 DIAGNOSIS — S81812A Laceration without foreign body, left lower leg, initial encounter: Secondary | ICD-10-CM

## 2022-10-28 MED ORDER — LIDOCAINE-EPINEPHRINE (PF) 2 %-1:200000 IJ SOLN
10.0000 mL | Freq: Once | INTRAMUSCULAR | Status: AC
  Administered 2022-10-28: 10 mL
  Filled 2022-10-28: qty 20

## 2022-10-28 MED ORDER — TETANUS-DIPHTH-ACELL PERTUSSIS 5-2.5-18.5 LF-MCG/0.5 IM SUSY
0.5000 mL | PREFILLED_SYRINGE | Freq: Once | INTRAMUSCULAR | Status: AC
  Administered 2022-10-28: 0.5 mL via INTRAMUSCULAR
  Filled 2022-10-28: qty 0.5

## 2022-10-28 NOTE — ED Provider Notes (Signed)
[name] [facility] AT [facility] Provider Note   CSN: [id number] Arrival date & time: 10/27/22  2301     History  Chief Complaint  Patient presents with   Leg Injury    Peter Snyder is a 67 y.o. male.  HPI   67 year old male room after tripping over his seat at a concert and sustaining a laceration to his left anterior shin.  The patient's tetanus is not up-to-date.  He was seen by the EMS tent at the concert and advised to present to the emergency department for further evaluation.  Denies any other injuries or complaints.   Home Medications Prior to Admission medications   Medication Sig Start Date End Date Taking? Authorizing Provider  acetaminophen (TYLENOL) 500 MG tablet Take 500 mg by mouth every 6 (six) hours as needed for moderate pain.    [provider]  ALPRAZolam Prudy Feeler) 0.25 MG tablet Take 0.25 mg by mouth daily as needed for anxiety.    [provider]  aspirin 81 MG tablet Take 81 mg by mouth daily.    [provider]  atorvastatin (LIPITOR) 80 MG tablet TAKE 1 TABLET BY MOUTH EVERY DAY 07/15/22   Burchette, Elberta Fortis, MD  Azelastine HCl 137 MCG/SPRAY SOLN PLACE 1 SPRAY INTO BOTH NOSTRILS 2 TIMES DAILY AS DIRECTED 06/02/22   Burchette, Elberta Fortis, MD  carvedilol (COREG) 12.5 MG tablet TAKE 1 TABLET TWICE A DAY WITH A MEAL 06/17/22   Burchette, Elberta Fortis, MD  diclofenac Sodium (VOLTAREN) 1 % GEL Apply 2 g topically daily as needed (for pain).    [provider]  escitalopram (LEXAPRO) 20 MG tablet Take 20 mg by mouth at bedtime.    [provider]  ezetimibe (ZETIA) 10 MG tablet TAKE 1 TABLET BY MOUTH EVERY DAY 06/17/22   Burchette, Elberta Fortis, MD  gabapentin (NEURONTIN) 600 MG tablet TAKE 1 TABLET BY MOUTH TWICE A DAY 10/16/22   Burchette, Elberta Fortis, MD  LOTEMAX 0.5 % GEL SMARTSIG:1 Drop(s) In Eye(s) Twice Daily PRN 12/06/18   [provider]  mometasone (NASONEX) 50 MCG/ACT nasal spray Place 2 sprays into the  nose daily. 09/14/22   Domenick Gong, MD  pantoprazole (PROTONIX) 40 MG tablet Take 1 tablet (40 mg total) by mouth 2 (two) times daily. 05/19/22   Burchette, Elberta Fortis, MD  RESTASIS 0.05 % ophthalmic emulsion Place 1 drop into both eyes 2 (two) times daily.  11/20/12   [provider]  sacubitril-valsartan (ENTRESTO) 49-51 MG Take 1 tablet by mouth 2 (two) times daily. 05/22/22   Lewayne Bunting, MD  tamsulosin (FLOMAX) 0.4 MG CAPS capsule TAKE 1 CAPSULE EVERY DAY AFTER SUPPER 08/08/22   Burchette, Elberta Fortis, MD  ULTRAM 50 MG tablet Take 50 mg by mouth 3 (three) times daily as needed. 03/24/22   [provider]  zolpidem (AMBIEN) 10 MG tablet Take 10 mg by mouth at bedtime as needed for sleep.    [provider]      Allergies    Doxycycline hyclate, Latex, Tape, and Sulfa antibiotics    Review of Systems   Review of Systems  Skin:  Positive for wound.  All other systems reviewed and are negative.   Physical Exam Updated Vital Signs BP (!) 164/94 (BP Location: Right Arm)   Pulse 86   Temp (!) 97.4 F (36.3 C)   Resp 16   Ht 6\' 2"  (1.88 m)   Wt 92.5 kg   SpO2 100%  BMI 26.19 kg/m  Physical Exam Vitals and nursing note reviewed.  Constitutional:      General: He is not in acute distress. HENT:     Head: Normocephalic and atraumatic.  Eyes:     Conjunctiva/sclera: Conjunctivae normal.     Pupils: Pupils are equal, round, and reactive to light.  Cardiovascular:     Rate and Rhythm: Normal rate and regular rhythm.  Pulmonary:     Effort: Pulmonary effort is normal. No respiratory distress.  Abdominal:     General: There is no distension.     Tenderness: There is no guarding.  Musculoskeletal:        General: No deformity or signs of injury.     Cervical back: Neck supple.     Comments: 3 cm laceration to the left anterior shin, underlying subcutaneous fat exposed, hemostatic  Skin:    Findings: No lesion or rash.  Neurological:     General: No  focal deficit present.     Mental Status: He is alert. Mental status is at baseline.     ED Results / Procedures / Treatments   Labs (all labs ordered are listed, but only abnormal results are displayed) Labs Reviewed - No data to display  EKG None  Radiology No results found.  Procedures .Marland KitchenLaceration Repair  Date/Time: 10/28/2022 4:16 AM  Performed by: Ernie Avena, MD Authorized by: Ernie Avena, MD   Consent:    Consent obtained:  Verbal   Consent given by:  Patient   Risks discussed:  Infection, pain, poor cosmetic result and poor wound healing Universal protocol:    Patient identity confirmed:  Verbally with patient Anesthesia:    Anesthesia method:  Local infiltration   Local anesthetic:  Lidocaine 1% WITH epi Laceration details:    Location:  Leg   Leg location:  L lower leg   Length (cm):  3   Depth (mm):  3 Treatment:    Area cleansed with:  Shur-Clens   Amount of cleaning:  Standard Skin repair:    Repair method:  Sutures   Suture size:  3-0   Suture material:  Prolene   Suture technique:  Simple interrupted   Number of sutures:  5 Approximation:    Approximation:  Close Repair type:    Repair type:  Simple Post-procedure details:    Dressing:  Antibiotic ointment and bulky dressing   Procedure completion:  Tolerated     Medications Ordered in ED Medications  lidocaine-EPINEPHrine (XYLOCAINE W/EPI) 2 %-1:200000 (PF) injection 10 mL (10 mLs Infiltration Given 10/28/22 0349)  Tdap (BOOSTRIX) injection 0.5 mL (0.5 mLs Intramuscular Given 10/28/22 0350)    ED Course/ Medical Decision Making/ A&P                                 Medical Decision Making Risk Prescription drug management.   67 year old male room after tripping over his seat at a concert and sustaining a laceration to his left anterior shin.  The patient's tetanus is not up-to-date.  He was seen by the EMS tent at the concert and advised to present to the emergency department  for further evaluation.  Denies any other injuries or complaints.  On arrival, the patient was afebrile, vitally stable.  Presenting with a 3 cm laceration to the left lower extremity with exposed subcutaneous fat.  The wound was irrigated bedside.  His tetanus was updated.  There is no evidence  of foreign body.  The wound was then closed per the procedure note above with 3-0 Prolene sutures.  The patient was provided with wound care instructions and infectious return precautions.  Overall stable for discharge with plan for outpatient follow-up for wound reassessment and suture removal.   Final Clinical Impression(s) / ED Diagnoses Final diagnoses:  Laceration of left lower extremity, initial encounter    Rx / DC Orders ED Discharge Orders     None         Ernie Avena, MD 10/28/22 [phone]

## 2022-10-28 NOTE — Discharge Instructions (Addendum)
Your patient was closed with sutures.  It was cleaned out and subsequently dressed.  Your tetanus was updated.  Watch for signs of developing infection which include redness, worsening pain and swelling, purulent drainage, fever and chills.  Follow-up with your PCP for wound recheck and suture removal

## 2022-10-29 ENCOUNTER — Encounter: Payer: Self-pay | Admitting: Family Medicine

## 2022-10-29 ENCOUNTER — Ambulatory Visit (INDEPENDENT_AMBULATORY_CARE_PROVIDER_SITE_OTHER): Payer: Medicare Other | Admitting: Family Medicine

## 2022-10-29 VITALS — BP 120/64 | HR 70 | Temp 97.7°F | Ht 74.0 in | Wt 210.6 lb

## 2022-10-29 DIAGNOSIS — S81812D Laceration without foreign body, left lower leg, subsequent encounter: Secondary | ICD-10-CM | POA: Diagnosis not present

## 2022-10-29 NOTE — Patient Instructions (Signed)
Continue to clean gently with soap and water  Let air dry and topical antibiotic  Follow up in about one week for suture removal.

## 2022-10-29 NOTE — Progress Notes (Signed)
Established Patient Office Visit  Subjective   Patient ID: Peter Snyder, male    DOB: November 18, 1955  Age: 67 y.o. MRN: [id number]  Chief Complaint  Patient presents with   Laceration    Patinet complains lf left leg laceration, x2 days     HPI   Peter Snyder is seen for follow-up regarding a laceration left lower leg.  This occurred Monday night at a concert in [facility].  He basically tripped and fell into a seat in front of him sustaining laceration to the left leg.  Had a bit of mild bruising left forearm.  Went to aid station and realized he had a fairly extensive gash and wife drove him back to [place] and he went to the ER and had this eventually repaired around 5 AM Tuesday morning  He has been elevating this and clean gently with soap and water and using topical antibiotic ointment.  No erythema or drainage.  Still able to walk and do his regular exercise  Past Medical History:  Diagnosis Date   Allergy    Allergy to sulfa drugs 12/10/2020   Arthritis    CAD (coronary artery disease) 6/14   Cardiac catheterization 6/27/ 2014 ejection fraction 35-40%, 30% proximal left circumflex, 100% tiny obtuse marginal 1 with collaterals, 50% LAD, 50% D1, 100% RCA with collaterals   Chicken pox    Chronic back pain    Colon polyps    Depression    Diabetes mellitus (HCC)    Diet controlled- OFF metformin 02-01-20   E coli infection 12/10/2020   Folliculitis 12/10/2020   GERD (gastroesophageal reflux disease)    Hyperlipidemia    Hypertension    Kidney stone    Myocardial infarction [facility])    ? year- cath 2014/ june    Past Surgical History:  Procedure Laterality Date   COLONOSCOPY     CRYO INTERCOSTAL NERVE BLOCK     double sacrum nerve block   epidural injections     multiple- L5 S1 and cervical neck pain issues as well    POLYPECTOMY     TONSILLECTOMY  1963   TRIGGER FINGER RELEASE  2019    reports that he quit smoking about 24 years ago. His smoking use included  cigarettes. He started smoking about 52 years ago. He has a 7 pack-year smoking history. He has never used smokeless tobacco. He reports that he does not drink alcohol and does not use drugs. family history includes Arthritis in his father and paternal grandmother; COPD in his father; Cancer in his mother; Clotting disorder in his paternal grandfather and paternal grandmother; Colon polyps in his father; Heart disease in his maternal grandmother; Hyperlipidemia in his maternal grandmother; Hypertension in his mother; Liver cancer in his paternal grandfather; Melanoma in his mother. Allergies  Allergen Reactions   Doxycycline Hyclate Nausea And Vomiting   Latex Other (See Comments)    Other   Tape     Use Paper Tape    Sulfa Antibiotics Rash    Review of Systems  Constitutional:  Negative for chills and fever.      Objective:     BP 120/64 (BP Location: Left Arm, Patient Position: Sitting, Cuff Size: Normal)   Pulse 70   Temp 97.7 F (36.5 C) (Oral)   Ht 6\' 2"  (1.88 m)   Wt 210 lb 9.6 oz (95.5 kg)   SpO2 98%   BMI 27.04 kg/m    Physical Exam Vitals reviewed.  Constitutional:  Appearance: Normal appearance.  Cardiovascular:     Rate and Rhythm: Normal rate and regular rhythm.  Pulmonary:     Effort: Pulmonary effort is normal.     Breath sounds: Normal breath sounds.  Skin:    Comments: Laceration left anterior leg.  6 sutures in place.  Healing well with no signs of secondary fraction.  No erythema.  No drainage.  Wound edges appear viable.  No significant edema  Neurological:     Mental Status: He is alert.      No results found for any visits on 10/29/22.    The 10-year ASCVD risk score (Arnett DK, et al., 2019) is: 23.8%    Assessment & Plan:   Laceration left leg.  Patient here for wound check today.  Healing well with no signs of secondary infection  -We redressed the wound -He had tetanus booster given in the ER -Continue to clean gently with soap  and water daily and continue topical antibiotic -Follow-up promptly for signs of secondary infection.  Otherwise follow-up in 1 week for suture removal   Evelena Peat, MD

## 2022-10-30 ENCOUNTER — Encounter: Payer: Self-pay | Admitting: Cardiology

## 2022-10-30 ENCOUNTER — Ambulatory Visit: Payer: Medicare Other | Attending: Cardiology | Admitting: Cardiology

## 2022-10-30 VITALS — BP 126/86 | HR 71 | Ht 74.0 in | Wt 212.4 lb

## 2022-10-30 DIAGNOSIS — I2510 Atherosclerotic heart disease of native coronary artery without angina pectoris: Secondary | ICD-10-CM | POA: Diagnosis present

## 2022-10-30 DIAGNOSIS — I10 Essential (primary) hypertension: Secondary | ICD-10-CM | POA: Insufficient documentation

## 2022-10-30 DIAGNOSIS — E785 Hyperlipidemia, unspecified: Secondary | ICD-10-CM | POA: Diagnosis present

## 2022-10-30 DIAGNOSIS — I255 Ischemic cardiomyopathy: Secondary | ICD-10-CM | POA: Insufficient documentation

## 2022-10-30 DIAGNOSIS — I7121 Aneurysm of the ascending aorta, without rupture: Secondary | ICD-10-CM | POA: Diagnosis present

## 2022-10-30 DIAGNOSIS — I2583 Coronary atherosclerosis due to lipid rich plaque: Secondary | ICD-10-CM | POA: Diagnosis present

## 2022-10-30 LAB — BASIC METABOLIC PANEL
BUN/Creatinine Ratio: 20 (ref 10–24)
BUN: 19 mg/dL (ref 8–27)
CO2: 24 mmol/L (ref 20–29)
Calcium: 9.3 mg/dL (ref 8.6–10.2)
Chloride: 100 mmol/L (ref 96–106)
Creatinine, Ser: 0.93 mg/dL (ref 0.76–1.27)
Glucose: 102 mg/dL — ABNORMAL HIGH (ref 70–99)
Potassium: 4.4 mmol/L (ref 3.5–5.2)
Sodium: 137 mmol/L (ref 134–144)
eGFR: 90 mL/min/{1.73_m2} (ref >59)

## 2022-10-30 NOTE — Patient Instructions (Signed)
  Follow-Up: At [facility], you and your health needs are our priority.  As part of our continuing mission to provide you with exceptional heart care, we have created designated Provider Care Teams.  These Care Teams include your primary Cardiologist (physician) and Advanced Practice Providers (APPs -  Physician Assistants and Nurse Practitioners) who all work together to provide you with the care you need, when you need it.  We recommend signing up for the patient portal called "MyChart".  Sign up information is provided on this After Visit Summary.  MyChart is used to connect with patients for Virtual Visits (Telemedicine).  Patients are able to view lab/test results, encounter notes, upcoming appointments, etc.  Non-urgent messages can be sent to your provider as well.   To learn more about what you can do with MyChart, go to [email or web address].    Your next appointment:   12 month(s)  Provider:   Brian Crenshaw MD    

## 2022-10-31 ENCOUNTER — Encounter: Payer: Self-pay | Admitting: Family Medicine

## 2022-11-05 ENCOUNTER — Encounter: Payer: Self-pay | Admitting: Family Medicine

## 2022-11-05 ENCOUNTER — Ambulatory Visit: Payer: Medicare Other | Admitting: Family Medicine

## 2022-11-05 VITALS — BP 96/60 | HR 70 | Temp 98.0°F | Ht 74.0 in | Wt 212.0 lb

## 2022-11-05 DIAGNOSIS — S81812D Laceration without foreign body, left lower leg, subsequent encounter: Secondary | ICD-10-CM | POA: Diagnosis not present

## 2022-11-05 NOTE — Progress Notes (Signed)
Established Patient Office Visit  Subjective   Patient ID: Peter Snyder, male    DOB: 1955/04/13  Age: 67 y.o. MRN: [id number]  Chief Complaint  Patient presents with   Suture / Staple Removal    HPI   Peter Snyder is seen today for suture removal.  Refer to previous note for details.  He had an unfortunate fall and lacerated his left leg against a seat at a concert down in [place].  He has been leaving open to air for drying.  No drainage.  Gently cleaning with soap and water.  Sutures intact.  Injury occurred 9 days ago.  Sutures were placed at local ER  Past Medical History:  Diagnosis Date   Allergy    Allergy to sulfa drugs 12/10/2020   Arthritis    CAD (coronary artery disease) 6/14   Cardiac catheterization 6/27/ 2014 ejection fraction 35-40%, 30% proximal left circumflex, 100% tiny obtuse marginal 1 with collaterals, 50% LAD, 50% D1, 100% RCA with collaterals   Chicken pox    Chronic back pain    Colon polyps    Depression    Diabetes mellitus (HCC)    Diet controlled- OFF metformin 02-01-20   E coli infection 12/10/2020   Folliculitis 12/10/2020   GERD (gastroesophageal reflux disease)    Hyperlipidemia    Hypertension    Kidney stone    Myocardial infarction [facility])    ? year- cath 2014/ june    Past Surgical History:  Procedure Laterality Date   COLONOSCOPY     CRYO INTERCOSTAL NERVE BLOCK     double sacrum nerve block   epidural injections     multiple- L5 S1 and cervical neck pain issues as well    POLYPECTOMY     TONSILLECTOMY  1963   TRIGGER FINGER RELEASE  2019    reports that he quit smoking about 24 years ago. His smoking use included cigarettes. He started smoking about 52 years ago. He has a 7 pack-year smoking history. He has never used smokeless tobacco. He reports that he does not drink alcohol and does not use drugs. family history includes Arthritis in his father and paternal grandmother; COPD in his father; Cancer in his mother; Clotting disorder in  his paternal grandfather and paternal grandmother; Colon polyps in his father; Heart disease in his maternal grandmother; Hyperlipidemia in his maternal grandmother; Hypertension in his mother; Liver cancer in his paternal grandfather; Melanoma in his mother. Allergies  Allergen Reactions   Doxycycline Hyclate Nausea And Vomiting   Latex Other (See Comments)    Other   Tape     Use Paper Tape    Sulfa Antibiotics Rash    Review of Systems  Constitutional:  Negative for chills and fever.      Objective:     BP 96/60 (BP Location: Left Arm, Patient Position: Sitting, Cuff Size: Normal)   Pulse 70   Temp 98 F (36.7 C) (Oral)   Ht 6\' 2"  (1.88 m)   Wt 212 lb (96.2 kg)   SpO2 99%   BMI 27.22 kg/m  BP Readings from Last 3 Encounters:  11/05/22 96/60  10/30/22 126/86  10/29/22 120/64   Wt Readings from Last 3 Encounters:  11/05/22 212 lb (96.2 kg)  10/30/22 212 lb 6.4 oz (96.3 kg)  10/29/22 210 lb 9.6 oz (95.5 kg)      Physical Exam Vitals reviewed.  Constitutional:      Appearance: Normal appearance.  Skin:    Comments: Left  leg reveals vertical laceration upper leg.  Somewhat dusky wound edges.  No purulent drainage.  No cellulitis changes.  He does have some moderate swelling around the wound but no fluctuance.  No significant warmth.  No foul odor.    Neurological:     Mental Status: He is alert.      No results found for any visits on 11/05/22.    The 10-year ASCVD risk score (Arnett DK, et al., 2019) is: 16.6%    Assessment & Plan:   Laceration left leg.  6 sutures removed.  We discussed the fact this will take quite some time to heal given location.  We did go ahead and apply a few Steri-Strips and would recommend Coban wrap prior to his walking for exercise and hold off exercise for the next 2 or 3 days  -Continue to clean daily with soap and water and then let air dry -Follow-up promptly for any signs of secondary infection  Evelena Peat,  MD

## 2022-11-05 NOTE — Patient Instructions (Signed)
Keep clean with soap and water  Follow up for any increased redness, pus drainage, or foul odor.

## 2022-11-07 ENCOUNTER — Encounter: Payer: Self-pay | Admitting: Family Medicine

## 2022-11-11 ENCOUNTER — Ambulatory Visit (INDEPENDENT_AMBULATORY_CARE_PROVIDER_SITE_OTHER): Payer: Medicare Other | Admitting: Family Medicine

## 2022-11-11 ENCOUNTER — Encounter: Payer: Self-pay | Admitting: Family Medicine

## 2022-11-11 VITALS — BP 124/64 | HR 65 | Temp 98.1°F | Ht 74.0 in | Wt 213.8 lb

## 2022-11-11 DIAGNOSIS — L03116 Cellulitis of left lower limb: Secondary | ICD-10-CM | POA: Diagnosis not present

## 2022-11-11 DIAGNOSIS — E1165 Type 2 diabetes mellitus with hyperglycemia: Secondary | ICD-10-CM | POA: Diagnosis not present

## 2022-11-11 DIAGNOSIS — S81812D Laceration without foreign body, left lower leg, subsequent encounter: Secondary | ICD-10-CM

## 2022-11-11 LAB — POCT GLYCOSYLATED HEMOGLOBIN (HGB A1C): Hemoglobin A1C: 6.1 % — AB (ref 4.0–5.6)

## 2022-11-11 MED ORDER — CEPHALEXIN 500 MG PO CAPS: 500.0000 mg | ORAL_CAPSULE | Freq: Four times a day (QID) | ORAL | 0 refills | Status: DC

## 2022-11-11 NOTE — Patient Instructions (Addendum)
OK to go back to walking and cycling  Clean daily with soap and water  Elevate leg when you can   Let steri-strips fall off --no need to re-apply.    Your A1C is improved at 6.1% which is great!

## 2022-11-11 NOTE — Progress Notes (Signed)
Established Patient Office Visit  Subjective   Patient ID: Peter Snyder, male    DOB: 1955-11-04  Age: 67 y.o. MRN: [id number]  Chief Complaint  Patient presents with   Medical Management of Chronic Issues   Wound Check    HPI   Quadarius is here for leg wound recheck.  This occurred couple weeks ago on a Monday night.  He was at a concert in [facility] and tripped and fell into seat in front of him sustaining laceration left leg.  Drove back to [place] and had sutures placed.  These were removed the other day and he had a little bit of mild necrosis at the wound edge and we placed a few Steri-Strips.  He has been avoiding cleaning with soap and water and basically leaving dry.  Has not been do any walking.  He misses his exercise.  Has had a little bit of oozing drainage at times.  No foul odor.  No fever.  Little bit of mild increased erythema past couple days.  Slightly sore to touch.  History of type 2 diabetes.  Last A1c was 6.8%.  Currently not taking any medications for this.  He did have multiple steroid injections last year which may have exacerbated his last A1c.  Past Medical History:  Diagnosis Date   Allergy    Allergy to sulfa drugs 12/10/2020   Arthritis    CAD (coronary artery disease) 6/14   Cardiac catheterization 6/27/ 2014 ejection fraction 35-40%, 30% proximal left circumflex, 100% tiny obtuse marginal 1 with collaterals, 50% LAD, 50% D1, 100% RCA with collaterals   Chicken pox    Chronic back pain    Colon polyps    Depression    Diabetes mellitus (HCC)    Diet controlled- OFF metformin 02-01-20   E coli infection 12/10/2020   Folliculitis 12/10/2020   GERD (gastroesophageal reflux disease)    Hyperlipidemia    Hypertension    Kidney stone    Myocardial infarction [facility])    ? year- cath 2014/ june    Past Surgical History:  Procedure Laterality Date   COLONOSCOPY     CRYO INTERCOSTAL NERVE BLOCK     double sacrum nerve block   epidural  injections     multiple- L5 S1 and cervical neck pain issues as well    POLYPECTOMY     TONSILLECTOMY  1963   TRIGGER FINGER RELEASE  2019    reports that he quit smoking about 24 years ago. His smoking use included cigarettes. He started smoking about 52 years ago. He has a 7 pack-year smoking history. He has never used smokeless tobacco. He reports that he does not drink alcohol and does not use drugs. family history includes Arthritis in his father and paternal grandmother; COPD in his father; Cancer in his mother; Clotting disorder in his paternal grandfather and paternal grandmother; Colon polyps in his father; Heart disease in his maternal grandmother; Hyperlipidemia in his maternal grandmother; Hypertension in his mother; Liver cancer in his paternal grandfather; Melanoma in his mother. Allergies  Allergen Reactions   Doxycycline Hyclate Nausea And Vomiting   Latex Other (See Comments)    Other   Tape     Use Paper Tape    Sulfa Antibiotics Rash    Review of Systems  Constitutional:  Negative for chills and fever.  Genitourinary:  Negative for dysuria.      Objective:     BP 124/64 (BP Location: Left Arm, Patient Position:  Sitting, Cuff Size: Normal)   Pulse 65   Temp 98.1 F (36.7 C) (Oral)   Ht 6\' 2"  (1.88 m)   Wt 213 lb 12.8 oz (97 kg)   SpO2 99%   BMI 27.45 kg/m  BP Readings from Last 3 Encounters:  11/11/22 124/64  11/05/22 96/60  10/30/22 126/86   Wt Readings from Last 3 Encounters:  11/11/22 213 lb 12.8 oz (97 kg)  11/05/22 212 lb (96.2 kg)  10/30/22 212 lb 6.4 oz (96.3 kg)      Physical Exam Vitals reviewed.  Constitutional:      Appearance: Normal appearance.  Cardiovascular:     Rate and Rhythm: Normal rate and regular rhythm.  Pulmonary:     Effort: Pulmonary effort is normal.     Breath sounds: Normal breath sounds.  Skin:    Comments: Left anterior leg wound reveals Steri-Strips in place.  No visible purulent drainage.  Does have a zone  about 6 x 8 cm of surrounding faint erythema.  Slightly warm to touch.  Minimally tender.  No fluctuance.  Neurological:     Mental Status: He is alert.      Results for orders placed or performed in visit on 11/11/22  POC HgB A1c  Result Value Ref Range   Hemoglobin A1C 6.1 (A) 4.0 - 5.6 %   HbA1c POC (<> result, manual entry)     HbA1c, POC (prediabetic range)     HbA1c, POC (controlled diabetic range)        The 10-year ASCVD risk score (Arnett DK, et al., 2019) is: 25%    Assessment & Plan:   #1 recent laceration left leg.  Suspect he has some early mild cellulitis changes at this time  -Continue to elevate leg frequently -Go ahead and start cleaning daily with soap and water and let air dry -No need to reapply any Steri-Strips after these fall off -Start Keflex 500 mg 4 times daily for 7 days -Follow-up for any fever, increased redness, or other concerns -Okay to resume walking as tolerated  #2 type 2 diabetes improved with A1c 6.1%.  Suspect last elevated A1c reflected recent steroids.  Set up follow-up in a few months and plan to get full labs then   No follow-ups on file.    Evelena Peat, MD

## 2022-11-14 ENCOUNTER — Other Ambulatory Visit: Payer: Self-pay | Admitting: Family Medicine

## 2022-11-19 ENCOUNTER — Encounter: Payer: Self-pay | Admitting: Family Medicine

## 2022-11-21 ENCOUNTER — Encounter: Payer: Self-pay | Admitting: Family Medicine

## 2022-12-02 ENCOUNTER — Ambulatory Visit (INDEPENDENT_AMBULATORY_CARE_PROVIDER_SITE_OTHER): Payer: Medicare Other | Admitting: Family Medicine

## 2022-12-02 ENCOUNTER — Other Ambulatory Visit: Payer: Self-pay | Admitting: Family Medicine

## 2022-12-02 ENCOUNTER — Encounter: Payer: Self-pay | Admitting: Family Medicine

## 2022-12-02 VITALS — BP 112/62 | HR 82 | Temp 97.9°F | Ht 74.0 in | Wt 213.7 lb

## 2022-12-02 DIAGNOSIS — Z23 Encounter for immunization: Secondary | ICD-10-CM | POA: Diagnosis not present

## 2022-12-02 DIAGNOSIS — N400 Enlarged prostate without lower urinary tract symptoms: Secondary | ICD-10-CM | POA: Diagnosis not present

## 2022-12-02 DIAGNOSIS — R300 Dysuria: Secondary | ICD-10-CM

## 2022-12-02 DIAGNOSIS — J019 Acute sinusitis, unspecified: Secondary | ICD-10-CM

## 2022-12-02 LAB — POC URINALSYSI DIPSTICK (AUTOMATED)
Bilirubin, UA: NEGATIVE
Blood, UA: NEGATIVE
Glucose, UA: NEGATIVE
Ketones, UA: NEGATIVE
Leukocytes, UA: NEGATIVE
Nitrite, UA: NEGATIVE
Protein, UA: NEGATIVE
Spec Grav, UA: 1.01 (ref 1.010–1.025)
Urobilinogen, UA: 0.2 U/dL
pH, UA: 6 (ref 5.0–8.0)

## 2022-12-02 NOTE — Progress Notes (Signed)
Established Patient Office Visit  Subjective   Patient ID: Peter Snyder, male    DOB: 1955/11/01  Age: 67 y.o. MRN: [id number]  Chief Complaint  Patient presents with   Dysuria        Wound Check         HPI   Peter Snyder is seen for wound recheck left anterior leg.  Refer to prior notes for details.  Laceration occurred at a concert in [place] on 8/13.  He came back to ER here in [place] and had sutures placed.  He had significant amount of drainage and possibly some early cellulitis changes at 1 point was treated with Keflex.  Has essentially healed at this time.  He is back to his usual walking and exercising an hour a day.  Other issue is some slow urinary stream especially early in the mornings.  Usually only gets up about once or none at night.  Takes Flomax regularly.  No recent burning with urination.  No current anticholinergic medications.  Past Medical History:  Diagnosis Date   Allergy    Allergy to sulfa drugs 12/10/2020   Arthritis    CAD (coronary artery disease) 6/14   Cardiac catheterization 6/27/ 2014 ejection fraction 35-40%, 30% proximal left circumflex, 100% tiny obtuse marginal 1 with collaterals, 50% LAD, 50% D1, 100% RCA with collaterals   Chicken pox    Chronic back pain    Colon polyps    Depression    Diabetes mellitus (HCC)    Diet controlled- OFF metformin 02-01-20   E coli infection 12/10/2020   Folliculitis 12/10/2020   GERD (gastroesophageal reflux disease)    Hyperlipidemia    Hypertension    Kidney stone    Myocardial infarction [facility])    ? year- cath 2014/ june    Past Surgical History:  Procedure Laterality Date   COLONOSCOPY     CRYO INTERCOSTAL NERVE BLOCK     double sacrum nerve block   epidural injections     multiple- L5 S1 and cervical neck pain issues as well    POLYPECTOMY     TONSILLECTOMY  1963   TRIGGER FINGER RELEASE  2019    reports that he quit smoking about 24 years ago. His smoking use included cigarettes. He  started smoking about 52 years ago. He has a 7 pack-year smoking history. He has never used smokeless tobacco. He reports that he does not drink alcohol and does not use drugs. family history includes Arthritis in his father and paternal grandmother; COPD in his father; Cancer in his mother; Clotting disorder in his paternal grandfather and paternal grandmother; Colon polyps in his father; Heart disease in his maternal grandmother; Hyperlipidemia in his maternal grandmother; Hypertension in his mother; Liver cancer in his paternal grandfather; Melanoma in his mother. Allergies  Allergen Reactions   Doxycycline Hyclate Nausea And Vomiting   Latex Other (See Comments)    Other   Tape     Use Paper Tape    Sulfa Antibiotics Rash    Review of Systems  Constitutional:  Negative for malaise/fatigue.  Eyes:  Negative for blurred vision.  Respiratory:  Negative for shortness of breath.   Cardiovascular:  Negative for chest pain.  Genitourinary:  Positive for dysuria. Negative for hematuria.  Neurological:  Negative for dizziness, weakness and headaches.      Objective:     BP 112/62 (BP Location: Left Arm, Patient Position: Sitting, Cuff Size: Normal)   Pulse 82   Temp 97.9  F (36.6 C) (Oral)   Ht 6\' 2"  (1.88 m)   Wt 213 lb 11.2 oz (96.9 kg)   SpO2 97%   BMI 27.44 kg/m  BP Readings from Last 3 Encounters:  12/02/22 112/62  11/11/22 124/64  11/05/22 96/60   Wt Readings from Last 3 Encounters:  12/02/22 213 lb 11.2 oz (96.9 kg)  11/11/22 213 lb 12.8 oz (97 kg)  11/05/22 212 lb (96.2 kg)      Physical Exam Vitals reviewed.  Constitutional:      Appearance: Normal appearance.  Cardiovascular:     Rate and Rhythm: Normal rate and regular rhythm.  Pulmonary:     Effort: Pulmonary effort is normal.     Breath sounds: Normal breath sounds.  Skin:    Comments: Left leg laceration has essentially healed.  Only 1 small remaining area of eschar.  No erythema or cellulitis changes.   Neurological:     Mental Status: He is alert.      Results for orders placed or performed in visit on 12/02/22  POCT Urinalysis Dipstick (Automated)  Result Value Ref Range   Color, UA Yellow    Clarity, UA Clear    Glucose, UA Negative Negative   Bilirubin, UA Negative    Ketones, UA Negative    Spec Grav, UA 1.010 1.010 - 1.025   Blood, UA Negative    pH, UA 6.0 5.0 - 8.0   Protein, UA Negative Negative   Urobilinogen, UA 0.2 0.2 or 1.0 E.U./dL   Nitrite, UA Negative    Leukocytes, UA Negative Negative      The 10-year ASCVD risk score (Arnett DK, et al., 2019) is: 21.3%    Assessment & Plan:   #1 left leg laceration which is fully healed at this time.  Follow-up as needed  #2 urine hesitancy.  He has history of BPH.  Currently on Flomax.  Symptoms seem to be more predominantly in the morning.  Urine dipstick is normal.  Observe for now.  If symptoms progress consider finasteride.  Avoid anticholinergic medications  Flu vaccine given.  He is considering COVID booster later this fall   Return in about 6 months (around 06/01/2023).    Evelena Peat, MD

## 2022-12-09 ENCOUNTER — Other Ambulatory Visit: Payer: Self-pay | Admitting: Family Medicine

## 2022-12-23 ENCOUNTER — Encounter: Payer: Self-pay | Admitting: Adult Health

## 2022-12-23 ENCOUNTER — Ambulatory Visit (INDEPENDENT_AMBULATORY_CARE_PROVIDER_SITE_OTHER): Payer: Medicare Other | Admitting: Adult Health

## 2022-12-23 VITALS — BP 110/60 | HR 71 | Temp 97.8°F | Ht 74.0 in | Wt 213.0 lb

## 2022-12-23 DIAGNOSIS — M150 Primary generalized (osteo)arthritis: Secondary | ICD-10-CM | POA: Diagnosis not present

## 2022-12-23 DIAGNOSIS — L918 Other hypertrophic disorders of the skin: Secondary | ICD-10-CM | POA: Diagnosis not present

## 2022-12-23 NOTE — Progress Notes (Signed)
Subjective:    Patient ID: Peter Snyder, male    DOB: 1955/06/18, 67 y.o.   MRN: [id number]  HPI 67 year old male who  has a past medical history of Allergy, Allergy to sulfa drugs (12/10/2020), Arthritis, CAD (coronary artery disease) (6/14), Chicken pox, Chronic back pain, Colon polyps, Depression, Diabetes mellitus (HCC), E coli infection (12/10/2020), Folliculitis (12/10/2020), GERD (gastroesophageal reflux disease), Hyperlipidemia, Hypertension, Kidney stone, and Myocardial infarction (HCC).  He presents to the office today for multiple joint pain. In the past he was on Celebrex. This was stopped prior to his endoscopy which was normal. He went back on Celebrex but this caused " my stomach to burn". He is working out on a regular basis. He takes Tramadol at night as it makes him sleepy.  He is wondering if there are on any anti inflammatories he can try during the day .   Also has a skin tag on the left side of his neck. This skin tag becomes irritated with clothing and shaving. He would like for me to remove it.    Review of Systems See HPI   Past Medical History:  Diagnosis Date   Allergy    Allergy to sulfa drugs 12/10/2020   Arthritis    CAD (coronary artery disease) 6/14   Cardiac catheterization 6/27/ 2014 ejection fraction 35-40%, 30% proximal left circumflex, 100% tiny obtuse marginal 1 with collaterals, 50% LAD, 50% D1, 100% RCA with collaterals   Chicken pox    Chronic back pain    Colon polyps    Depression    Diabetes mellitus (HCC)    Diet controlled- OFF metformin 02-01-20   E coli infection 12/10/2020   Folliculitis 12/10/2020   GERD (gastroesophageal reflux disease)    Hyperlipidemia    Hypertension    Kidney stone    Myocardial infarction [facility])    ? year- cath 2014/ june     Social History   Socioeconomic History   Marital status: Married    Spouse name: Not on file   Number of children: 3   Years of education: Not on file   Highest education level:  Bachelor's degree (e.g., BA, AB, BS)  Occupational History   Occupation: retired    Comment: Retired  Tobacco Use   Smoking status: Former    Current packs/day: 0.00    Average packs/day: 0.3 packs/day for 28.0 years (7.0 ttl pk-yrs)    Types: Cigarettes    Start date: 33    Quit date: 2000    Years since quitting: 24.7   Smokeless tobacco: Never  Vaping Use   Vaping status: Never Used  Substance and Sexual Activity   Alcohol use: No    Alcohol/week: 0.0 standard drinks of alcohol   Drug use: No   Sexual activity: Not on file  Other Topics Concern   Not on file  Social History Narrative   Not on file   Social Determinants of Health   Financial Resource Strain: Low Risk  (06/11/2022)   Overall Financial Resource Strain (CARDIA)    Difficulty of Paying Living Expenses: Not hard at all  Food Insecurity: No Food Insecurity (06/11/2022)   Hunger Vital Sign    Worried About Running Out of Food in the Last Year: Never true    Ran Out of Food in the Last Year: Never true  Transportation Needs: No Transportation Needs (06/11/2022)   PRAPARE - Transportation    Lack of Transportation (Medical): No    Lack of  Transportation (Non-Medical): No  Physical Activity: Sufficiently Active (06/11/2022)   Exercise Vital Sign    Days of Exercise per Week: 5 days    Minutes of Exercise per Session: 50 min  Stress: No Stress Concern Present (06/11/2022)   [employer] of Occupational Health - Occupational Stress Questionnaire    Feeling of Stress : Not at all  Social Connections: Socially Integrated (06/11/2022)   Social Connection and Isolation Panel [NHANES]    Frequency of Communication with Friends and Family: More than three times a week    Frequency of Social Gatherings with Friends and Family: More than three times a week    Attends Religious Services: More than 4 times per year    Active Member of [employer] or Organizations: Yes    Attends Engineer, structural: More than 4  times per year    Marital Status: Married  Catering manager Violence: Not At Risk (06/11/2022)   Humiliation, Afraid, Rape, and Kick questionnaire    Fear of Current or Ex-Partner: No    Emotionally Abused: No    Physically Abused: No    Sexually Abused: No    Past Surgical History:  Procedure Laterality Date   COLONOSCOPY     CRYO INTERCOSTAL NERVE BLOCK     double sacrum nerve block   epidural injections     multiple- L5 S1 and cervical neck pain issues as well    POLYPECTOMY     TONSILLECTOMY  1963   TRIGGER FINGER RELEASE  2019    Family History  Problem Relation Age of Onset   Melanoma Mother        metastatic   Cancer Mother        melanoma   Hypertension Mother    Arthritis Father    Colon polyps Father    COPD Father    Hyperlipidemia Maternal Grandmother    Heart disease Maternal Grandmother    Arthritis Paternal Grandmother    Clotting disorder Paternal Grandmother    Clotting disorder Paternal Grandfather    Liver cancer Paternal Grandfather    Colon cancer Neg Hx    Esophageal cancer Neg Hx    Rectal cancer Neg Hx    Stomach cancer Neg Hx     Allergies  Allergen Reactions   Doxycycline Hyclate Nausea And Vomiting   Latex Other (See Comments)    Other   Tape     Use Paper Tape    Sulfa Antibiotics Rash    Current Outpatient Medications on File Prior to Visit  Medication Sig Dispense Refill   acetaminophen (TYLENOL) 500 MG tablet Take 500 mg by mouth every 6 (six) hours as needed for moderate pain.     ALPRAZolam (XANAX) 0.25 MG tablet Take 0.25 mg by mouth daily as needed for anxiety.     aspirin 81 MG tablet Take 81 mg by mouth daily.     atorvastatin (LIPITOR) 80 MG tablet TAKE 1 TABLET BY MOUTH EVERY DAY 90 tablet 1   Azelastine HCl 137 MCG/SPRAY SOLN PLACE 1 SPRAY INTO BOTH NOSTRILS 2 TIMES DAILY AS DIRECTED 90 mL 1   carvedilol (COREG) 12.5 MG tablet TAKE 1 TABLET TWICE A DAY WITH A MEAL 180 tablet 1   diclofenac Sodium (VOLTAREN) 1 % GEL  Apply 2 g topically daily as needed (for pain).     escitalopram (LEXAPRO) 20 MG tablet Take 20 mg by mouth at bedtime.     ezetimibe (ZETIA) 10 MG tablet TAKE 1 TABLET BY  MOUTH EVERY DAY 90 tablet 1   gabapentin (NEURONTIN) 600 MG tablet TAKE 1 TABLET BY MOUTH TWICE A DAY 180 tablet 1   LOTEMAX 0.5 % GEL SMARTSIG:1 Drop(s) In Eye(s) Twice Daily PRN     pantoprazole (PROTONIX) 40 MG tablet Take 1 tablet (40 mg total) by mouth 2 (two) times daily. 180 tablet 3   pimecrolimus (ELIDEL) 1 % cream 1 Application as needed.     RESTASIS 0.05 % ophthalmic emulsion Place 1 drop into both eyes 2 (two) times daily.      sacubitril-valsartan (ENTRESTO) 49-51 MG Take 1 tablet by mouth 2 (two) times daily. 180 tablet 3   tamsulosin (FLOMAX) 0.4 MG CAPS capsule TAKE 1 CAPSULE EVERY DAY AFTER SUPPER 90 capsule 1   ULTRAM 50 MG tablet Take 50 mg by mouth 3 (three) times daily as needed.     zolpidem (AMBIEN) 10 MG tablet Take 10 mg by mouth at bedtime as needed for sleep.     mometasone (NASONEX) 50 MCG/ACT nasal spray Place 2 sprays into the nose daily. 17 g 0   No current facility-administered medications on file prior to visit.    BP 110/60   Pulse 71   Temp 97.8 F (36.6 C) (Oral)   Ht 6\' 2"  (1.88 m)   Wt 213 lb (96.6 kg)   SpO2 97%   BMI 27.35 kg/m       Objective:   Physical Exam Vitals and nursing note reviewed.  Constitutional:      Appearance: Normal appearance.  Cardiovascular:     Rate and Rhythm: Normal rate and regular rhythm.     Pulses: Normal pulses.     Heart sounds: Normal heart sounds.  Pulmonary:     Effort: Pulmonary effort is normal.     Breath sounds: Normal breath sounds.  Musculoskeletal:        General: Normal range of motion.  Skin:    General: Skin is warm and dry.     Comments: Skin tag    Neurological:     General: No focal deficit present.     Mental Status: He is alert and oriented to person, place, and time.  Psychiatric:        Mood and Affect: Mood  normal.        Behavior: Behavior normal.        Thought Content: Thought content normal.        Judgment: Judgment normal.        Assessment & Plan:  1. Primary osteoarthritis involving multiple joints - Will have him try Aleve twice a day PRN   2. Skin tag - Verbal consent obtained. Using cyrotherapy, three freeze thaw cycles were completed. He tolerated procedure well.  -aftercare instructions reviewed

## 2022-12-23 NOTE — Patient Instructions (Signed)
Try using Aleve twice a day

## 2022-12-24 ENCOUNTER — Encounter: Payer: Self-pay | Admitting: Adult Health

## 2022-12-24 NOTE — Telephone Encounter (Signed)
Please advise 

## 2023-01-09 ENCOUNTER — Other Ambulatory Visit: Payer: Self-pay | Admitting: Family Medicine

## 2023-02-03 ENCOUNTER — Encounter: Payer: Self-pay | Admitting: Adult Health

## 2023-02-03 ENCOUNTER — Ambulatory Visit (INDEPENDENT_AMBULATORY_CARE_PROVIDER_SITE_OTHER): Payer: Medicare Other | Admitting: Adult Health

## 2023-02-03 VITALS — BP 110/60 | HR 73 | Temp 97.7°F | Ht 74.0 in | Wt 214.0 lb

## 2023-02-03 DIAGNOSIS — R6889 Other general symptoms and signs: Secondary | ICD-10-CM | POA: Diagnosis not present

## 2023-02-03 DIAGNOSIS — J988 Other specified respiratory disorders: Secondary | ICD-10-CM

## 2023-02-03 LAB — POC COVID19 BINAXNOW: SARS Coronavirus 2 Ag: NEGATIVE

## 2023-02-03 NOTE — Progress Notes (Signed)
Subjective:    Patient ID: Peter Snyder, male    DOB: [birth year], 67 y.o.   MRN: [id number]  HPI 67 year old male who  has a past medical history of Allergy, Allergy to sulfa drugs (12/10/2020), Arthritis, CAD (coronary artery disease) (6/14), Chicken pox, Chronic back pain, Colon polyps, Depression, Diabetes mellitus (HCC), E coli infection (12/10/2020), Folliculitis (12/10/2020), GERD (gastroesophageal reflux disease), Hyperlipidemia, Hypertension, Kidney stone, and Myocardial infarction (HCC).  He is being evaluated today for an acute issue.  He reports over the last 5 days he has had a mild nonproductive cough.  Over the last 3 days he has been experiencing rhinorrhea with clear discharge and sneezing.  He has not had any fevers, chills, shortness of breath, sore throat, or ear pain.  Home he has been using Astelin nasal spray and Nasonex and now has a "burning" sensation in his nose.  Review of Systems See HPI   Past Medical History:  Diagnosis Date   Allergy    Allergy to sulfa drugs 12/10/2020   Arthritis    CAD (coronary artery disease) 6/14   Cardiac catheterization 6/27/ 2014 ejection fraction 35-40%, 30% proximal left circumflex, 100% tiny obtuse marginal 1 with collaterals, 50% LAD, 50% D1, 100% RCA with collaterals   Chicken pox    Chronic back pain    Colon polyps    Depression    Diabetes mellitus (HCC)    Diet controlled- OFF metformin 02-01-20   E coli infection 12/10/2020   Folliculitis 12/10/2020   GERD (gastroesophageal reflux disease)    Hyperlipidemia    Hypertension    Kidney stone    Myocardial infarction [facility])    ? year- cath 2014/ june     Social History   Socioeconomic History   Marital status: Married    Spouse name: Not on file   Number of children: 3   Years of education: Not on file   Highest education level: Bachelor's degree (e.g., BA, AB, BS)  Occupational History   Occupation: retired    Comment: Retired  Tobacco Use   Smoking status:  Former    Current packs/day: 0.00    Average packs/day: 0.3 packs/day for 28.0 years (7.0 ttl pk-yrs)    Types: Cigarettes    Start date: 58    Quit date: 2000    Years since quitting: 24.9   Smokeless tobacco: Never  Vaping Use   Vaping status: Never Used  Substance and Sexual Activity   Alcohol use: No    Alcohol/week: 0.0 standard drinks of alcohol   Drug use: No   Sexual activity: Not on file  Other Topics Concern   Not on file  Social History Narrative   Not on file   Social Determinants of Health   Financial Resource Strain: Low Risk  (02/02/2023)   Overall Financial Resource Strain (CARDIA)    Difficulty of Paying Living Expenses: Not hard at all  Food Insecurity: No Food Insecurity (02/02/2023)   Hunger Vital Sign    Worried About Running Out of Food in the Last Year: Never true    Ran Out of Food in the Last Year: Never true  Transportation Needs: No Transportation Needs (02/02/2023)   PRAPARE - Administrator, Civil Service (Medical): No    Lack of Transportation (Non-Medical): No  Physical Activity: Sufficiently Active (02/02/2023)   Exercise Vital Sign    Days of Exercise per Week: 6 days    Minutes of Exercise per Session: 50  min  Stress: No Stress Concern Present (02/02/2023)   [employer] of Occupational Health - Occupational Stress Questionnaire    Feeling of Stress : Only a little  Social Connections: Socially Integrated (02/02/2023)   Social Connection and Isolation Panel [NHANES]    Frequency of Communication with Friends and Family: Three times a week    Frequency of Social Gatherings with Friends and Family: Three times a week    Attends Religious Services: 1 to 4 times per year    Active Member of Clubs or Organizations: Yes    Attends Banker Meetings: More than 4 times per year    Marital Status: Married  Catering manager Violence: Not At Risk (06/11/2022)   Humiliation, Afraid, Rape, and Kick questionnaire     Fear of Current or Ex-Partner: No    Emotionally Abused: No    Physically Abused: No    Sexually Abused: No    Past Surgical History:  Procedure Laterality Date   COLONOSCOPY     CRYO INTERCOSTAL NERVE BLOCK     double sacrum nerve block   epidural injections     multiple- L5 S1 and cervical neck pain issues as well    POLYPECTOMY     TONSILLECTOMY  1963   TRIGGER FINGER RELEASE  2019    Family History  Problem Relation Age of Onset   Melanoma Mother        metastatic   Cancer Mother        melanoma   Hypertension Mother    Arthritis Father    Colon polyps Father    COPD Father    Hyperlipidemia Maternal Grandmother    Heart disease Maternal Grandmother    Arthritis Paternal Grandmother    Clotting disorder Paternal Grandmother    Clotting disorder Paternal Grandfather    Liver cancer Paternal Grandfather    Colon cancer Neg Hx    Esophageal cancer Neg Hx    Rectal cancer Neg Hx    Stomach cancer Neg Hx     Allergies  Allergen Reactions   Doxycycline Hyclate Nausea And Vomiting   Latex Other (See Comments)    Other   Tape     Use Paper Tape    Sulfa Antibiotics Rash    Current Outpatient Medications on File Prior to Visit  Medication Sig Dispense Refill   acetaminophen (TYLENOL) 500 MG tablet Take 500 mg by mouth every 6 (six) hours as needed for moderate pain.     ALPRAZolam (XANAX) 0.25 MG tablet Take 0.25 mg by mouth daily as needed for anxiety.     aspirin 81 MG tablet Take 81 mg by mouth daily.     atorvastatin (LIPITOR) 80 MG tablet TAKE 1 TABLET BY MOUTH EVERY DAY 90 tablet 1   Azelastine HCl 137 MCG/SPRAY SOLN PLACE 1 SPRAY INTO BOTH NOSTRILS 2 TIMES DAILY AS DIRECTED 90 mL 1   carvedilol (COREG) 12.5 MG tablet TAKE 1 TABLET TWICE A DAY WITH A MEAL 180 tablet 1   diclofenac Sodium (VOLTAREN) 1 % GEL Apply 2 g topically daily as needed (for pain).     escitalopram (LEXAPRO) 20 MG tablet Take 20 mg by mouth at bedtime.     ezetimibe (ZETIA) 10 MG  tablet TAKE 1 TABLET BY MOUTH EVERY DAY 90 tablet 1   gabapentin (NEURONTIN) 600 MG tablet TAKE 1 TABLET BY MOUTH TWICE A DAY 180 tablet 1   LOTEMAX 0.5 % GEL SMARTSIG:1 Drop(s) In Eye(s) Twice Daily PRN  pantoprazole (PROTONIX) 40 MG tablet Take 1 tablet (40 mg total) by mouth 2 (two) times daily. 180 tablet 3   pimecrolimus (ELIDEL) 1 % cream 1 Application as needed.     RESTASIS 0.05 % ophthalmic emulsion Place 1 drop into both eyes 2 (two) times daily.      sacubitril-valsartan (ENTRESTO) 49-51 MG Take 1 tablet by mouth 2 (two) times daily. 180 tablet 3   tamsulosin (FLOMAX) 0.4 MG CAPS capsule TAKE 1 CAPSULE EVERY DAY AFTER SUPPER 90 capsule 1   ULTRAM 50 MG tablet Take 50 mg by mouth 3 (three) times daily as needed.     zolpidem (AMBIEN) 10 MG tablet Take 10 mg by mouth at bedtime as needed for sleep.     No current facility-administered medications on file prior to visit.    BP 110/60   Pulse 73   Temp 97.7 F (36.5 C) (Oral)   Ht 6\' 2"  (1.88 m)   Wt 214 lb (97.1 kg)   SpO2 98%   BMI 27.48 kg/m       Objective:   Physical Exam Vitals and nursing note reviewed.  Constitutional:      Appearance: Normal appearance.  HENT:     Nose: No congestion or rhinorrhea.     Right Nostril: No epistaxis.     Left Nostril: No epistaxis.     Right Turbinates: Not enlarged or swollen.     Left Turbinates: Not enlarged or swollen.     Comments: He does have some irritation noted in his nasal cavity bilaterally. Cardiovascular:     Rate and Rhythm: Normal rate and regular rhythm.     Pulses: Normal pulses.     Heart sounds: Normal heart sounds.  Pulmonary:     Effort: Pulmonary effort is normal.     Breath sounds: Normal breath sounds.  Musculoskeletal:        General: Normal range of motion.  Skin:    General: Skin is warm and dry.  Neurological:     General: No focal deficit present.     Mental Status: He is alert and oriented to person, place, and time.  Psychiatric:         Mood and Affect: Mood normal.        Behavior: Behavior normal.        Thought Content: Thought content normal.        Judgment: Judgment normal.       Assessment & Plan:  1. Flu-like symptoms  - POC COVID-19 BinaxNow- negative   2. Respiratory tract infection -His symptoms are mild.  Likely either viral or allergy mediated.  No signs of bacterial sinusitis.  Lungs clear so there is no concern for pneumonia.  Encouraged to continue with conservative measures.  He can start using saline nasal gel to help with the irritation from his nasal sprays.  Follow-up if symptoms worsen or he develops fevers  Shirline Frees, NP

## 2023-02-04 ENCOUNTER — Ambulatory Visit: Payer: Medicare Other | Admitting: Family Medicine

## 2023-02-04 ENCOUNTER — Ambulatory Visit: Payer: BLUE CROSS/BLUE SHIELD | Admitting: Family Medicine

## 2023-02-04 ENCOUNTER — Encounter: Payer: Self-pay | Admitting: Adult Health

## 2023-02-04 ENCOUNTER — Ambulatory Visit: Payer: BLUE CROSS/BLUE SHIELD | Admitting: Adult Health

## 2023-02-04 ENCOUNTER — Encounter: Payer: Self-pay | Admitting: Family Medicine

## 2023-02-04 VITALS — BP 124/60 | HR 62 | Temp 98.0°F | Ht 74.0 in | Wt 215.6 lb

## 2023-02-04 DIAGNOSIS — R0981 Nasal congestion: Secondary | ICD-10-CM | POA: Diagnosis not present

## 2023-02-04 NOTE — Progress Notes (Signed)
Established Patient Office Visit  Subjective   Patient ID: Peter Snyder, male    DOB: May 31, 1955  Age: 67 y.o. MRN: [id number]  Chief Complaint  Patient presents with   Sinusitis    HPI   Akzel is seen with some persistent nasal congestion.  Was seen yesterday and COVID test negative.  Was felt to be allergic versus viral.  He takes nasal steroid with Nasonex at baseline and has been using some nasal saline irrigation and occasional Afrin.  Has had some progressive left frontal sinus pressure and pain.  No fever.  Recently diagnosed with cataract left eye.  Has had history of chronic meibomian gland inflammation in the past.  Also takes Mucinex intermittently.  Has had frequent sinusitis in the past.  No cough.  Still exercising regularly.  Past Medical History:  Diagnosis Date   Allergy    Allergy to sulfa drugs 12/10/2020   Arthritis    CAD (coronary artery disease) 6/14   Cardiac catheterization 6/27/ 2014 ejection fraction 35-40%, 30% proximal left circumflex, 100% tiny obtuse marginal 1 with collaterals, 50% LAD, 50% D1, 100% RCA with collaterals   Chicken pox    Chronic back pain    Colon polyps    Depression    Diabetes mellitus (HCC)    Diet controlled- OFF metformin 02-01-20   E coli infection 12/10/2020   Folliculitis 12/10/2020   GERD (gastroesophageal reflux disease)    Hyperlipidemia    Hypertension    Kidney stone    Myocardial infarction [facility])    ? year- cath 2014/ june    Past Surgical History:  Procedure Laterality Date   COLONOSCOPY     CRYO INTERCOSTAL NERVE BLOCK     double sacrum nerve block   epidural injections     multiple- L5 S1 and cervical neck pain issues as well    POLYPECTOMY     TONSILLECTOMY  1963   TRIGGER FINGER RELEASE  2019    reports that he quit smoking about 24 years ago. His smoking use included cigarettes. He started smoking about 52 years ago. He has a 7 pack-year smoking history. He has never used smokeless tobacco. He reports  that he does not drink alcohol and does not use drugs. family history includes Arthritis in his father and paternal grandmother; COPD in his father; Cancer in his mother; Clotting disorder in his paternal grandfather and paternal grandmother; Colon polyps in his father; Heart disease in his maternal grandmother; Hyperlipidemia in his maternal grandmother; Hypertension in his mother; Liver cancer in his paternal grandfather; Melanoma in his mother. Allergies  Allergen Reactions   Doxycycline Hyclate Nausea And Vomiting   Latex Other (See Comments)    Other   Tape     Use Paper Tape    Sulfa Antibiotics Rash    Review of Systems  Constitutional:  Negative for chills and fever.  HENT:  Positive for congestion and sinus pain. Negative for ear pain and nosebleeds.   Respiratory:  Negative for cough.       Objective:     BP 124/60 (BP Location: Left Arm, Patient Position: Sitting, Cuff Size: Normal)   Pulse 62   Temp 98 F (36.7 C) (Oral)   Ht 6\' 2"  (1.88 m)   Wt 215 lb 9.6 oz (97.8 kg)   SpO2 98%   BMI 27.68 kg/m  BP Readings from Last 3 Encounters:  02/04/23 124/60  02/03/23 110/60  12/23/22 110/60   Wt Readings from Last 3  Encounters:  02/04/23 215 lb 9.6 oz (97.8 kg)  02/03/23 214 lb (97.1 kg)  12/23/22 213 lb (96.6 kg)      Physical Exam Vitals reviewed.  Constitutional:      General: He is not in acute distress. HENT:     Right Ear: Tympanic membrane normal.     Left Ear: Tympanic membrane normal.     Nose:     Comments: He has some mucosal swelling left naris greater than right with some fairly thick mostly white mucus with slight yellow tinge.  No visible polyps. Cardiovascular:     Rate and Rhythm: Normal rate and regular rhythm.  Neurological:     Mental Status: He is alert.      No results found for any visits on 02/04/23.    The 10-year ASCVD risk score (Arnett DK, et al., 2019) is: 25%    Assessment & Plan:   Sinus congestive symptoms with  left frontal sinus pressure.  May have early evolving sinusitis but suspect probably viral.  Recent COVID test negative.  -Recommend Mucinex 1200 mg twice daily -Recommend moist heat over sinuses frequently -Continue saline nasal irrigation -Continue nasal steroid -Follow-up for any fever, progressive sinus pressure or any purulent type secretions  Evelena Peat, MD

## 2023-02-20 ENCOUNTER — Encounter: Payer: Self-pay | Admitting: Family Medicine

## 2023-02-23 ENCOUNTER — Ambulatory Visit (INDEPENDENT_AMBULATORY_CARE_PROVIDER_SITE_OTHER): Payer: Medicare Other | Admitting: Family Medicine

## 2023-02-23 VITALS — BP 110/60 | HR 64 | Temp 97.9°F | Ht 74.0 in | Wt 216.2 lb

## 2023-02-23 DIAGNOSIS — L570 Actinic keratosis: Secondary | ICD-10-CM | POA: Diagnosis not present

## 2023-02-23 DIAGNOSIS — R519 Headache, unspecified: Secondary | ICD-10-CM

## 2023-02-23 NOTE — Progress Notes (Unsigned)
Established Patient Office Visit  Subjective   Patient ID: Peter Snyder, male    DOB: Mar 06, 1956  Age: 67 y.o. MRN: [id number]  Chief Complaint  Patient presents with   Headache   Hemorrhoids    HPI  {History (Optional):23778} Shikhar is seen today with some sneezing and left frontal headaches intermittently.  He suspects allergies.  No purulent discharge at this time.  Takes Flonase and Astelin regularly.  No throbbing headache.  No history of migraines.  He also relates scaly hyperkeratotic lesion left forearm.  His neighbor is a Armed forces operational officer and suggested this may represent actinic keratosis and suggested cryotherapy.  He sees a Armed forces operational officer but not scheduled to see them till January.  He would like to consider whether this should be treated today with liquid nitrogen.  Had some chronic perianal irritation in the past with some pruritus.  Has triamcinolone which he has used intermittently.  No recent bleeding.  Past Medical History:  Diagnosis Date   Allergy    Allergy to sulfa drugs 12/10/2020   Arthritis    CAD (coronary artery disease) 6/14   Cardiac catheterization 6/27/ 2014 ejection fraction 35-40%, 30% proximal left circumflex, 100% tiny obtuse marginal 1 with collaterals, 50% LAD, 50% D1, 100% RCA with collaterals   Chicken pox    Chronic back pain    Colon polyps    Depression    Diabetes mellitus (HCC)    Diet controlled- OFF metformin 02-01-20   E coli infection 12/10/2020   Folliculitis 12/10/2020   GERD (gastroesophageal reflux disease)    Hyperlipidemia    Hypertension    Kidney stone    Myocardial infarction [facility])    ? year- cath 2014/ june    Past Surgical History:  Procedure Laterality Date   COLONOSCOPY     CRYO INTERCOSTAL NERVE BLOCK     double sacrum nerve block   epidural injections     multiple- L5 S1 and cervical neck pain issues as well    POLYPECTOMY     TONSILLECTOMY  1963   TRIGGER FINGER RELEASE  2019    reports that he quit smoking  about 24 years ago. His smoking use included cigarettes. He started smoking about 52 years ago. He has a 7 pack-year smoking history. He has never used smokeless tobacco. He reports that he does not drink alcohol and does not use drugs. family history includes Arthritis in his father and paternal grandmother; COPD in his father; Cancer in his mother; Clotting disorder in his paternal grandfather and paternal grandmother; Colon polyps in his father; Heart disease in his maternal grandmother; Hyperlipidemia in his maternal grandmother; Hypertension in his mother; Liver cancer in his paternal grandfather; Melanoma in his mother. Allergies  Allergen Reactions   Doxycycline Hyclate Nausea And Vomiting   Latex Other (See Comments)    Other   Tape     Use Paper Tape    Sulfa Antibiotics Rash    Review of Systems  Constitutional:  Negative for chills and fever.  HENT:  Positive for congestion and sinus pain.   Neurological:  Positive for headaches.      Objective:     BP 110/60 (BP Location: Left Arm, Patient Position: Sitting, Cuff Size: Normal)   Pulse 64   Temp 97.9 F (36.6 C) (Oral)   Ht 6\' 2"  (1.88 m)   Wt 216 lb 3.2 oz (98.1 kg)   SpO2 99%   BMI 27.76 kg/m  BP Readings from Last 3 Encounters:  02/23/23 110/60  02/04/23 124/60  02/03/23 110/60   Wt Readings from Last 3 Encounters:  02/23/23 216 lb 3.2 oz (98.1 kg)  02/04/23 215 lb 9.6 oz (97.8 kg)  02/03/23 214 lb (97.1 kg)      Physical Exam Vitals reviewed.  Constitutional:      General: He is not in acute distress.    Appearance: He is not ill-appearing.  Cardiovascular:     Rate and Rhythm: Normal rate and regular rhythm.  Skin:    Comments: Left forearm reveals approximately 4 x 4 millimeter hyperkeratotic slightly excoriated skin lesion.  No ulceration.  Neurological:     Mental Status: He is alert.      No results found for any visits on 02/23/23.  {Labs (Optional):23779}  The 10-year ASCVD risk  score (Arnett DK, et al., 2019) is: 20.7%    Assessment & Plan:   #1 small probable actinic keratosis left dorsal forearm.  Discussed risk and benefits of liquid nitrogen therapy including risk of pain, blistering, low risk of infection and scarring.  Patient consents.  Treated with liquid nitrogen and patient tolerated well.  Recommend follow-up either here or with his dermatologist in a month or 2 if lesion not resolving with liquid nitrogen  #2 sinus type headache.  Longstanding history of allergic rhinitis.  No objective evidence for purulent sinusitis at this time.  Continue Flonase and Astelin.  Follow-up for any purulent secretions, fever, or worsening headache  Evelena Peat, MD

## 2023-03-03 ENCOUNTER — Encounter: Payer: Self-pay | Admitting: Family Medicine

## 2023-03-03 MED ORDER — TRIAMCINOLONE ACETONIDE 0.1 % EX CREA: TOPICAL_CREAM | CUTANEOUS | 1 refills | Status: AC

## 2023-03-16 ENCOUNTER — Other Ambulatory Visit: Payer: Self-pay | Admitting: Family Medicine

## 2023-03-20 ENCOUNTER — Other Ambulatory Visit: Payer: Self-pay | Admitting: Family Medicine

## 2023-03-24 ENCOUNTER — Encounter: Payer: Self-pay | Admitting: Family Medicine

## 2023-03-24 MED ORDER — TAMSULOSIN HCL 0.4 MG PO CAPS: ORAL_CAPSULE | ORAL | 1 refills | Status: DC

## 2023-04-03 ENCOUNTER — Encounter: Payer: Self-pay | Admitting: Family Medicine

## 2023-04-07 ENCOUNTER — Encounter: Payer: Self-pay | Admitting: Family Medicine

## 2023-04-07 ENCOUNTER — Ambulatory Visit (INDEPENDENT_AMBULATORY_CARE_PROVIDER_SITE_OTHER): Payer: Medicare Other | Admitting: Family Medicine

## 2023-04-07 VITALS — BP 112/74 | HR 66 | Temp 97.6°F | Ht 74.0 in | Wt 214.9 lb

## 2023-04-07 DIAGNOSIS — L720 Epidermal cyst: Secondary | ICD-10-CM | POA: Diagnosis not present

## 2023-04-07 DIAGNOSIS — L853 Xerosis cutis: Secondary | ICD-10-CM

## 2023-04-07 NOTE — Progress Notes (Signed)
Established Patient Office Visit  Subjective   Patient ID: Peter Snyder, male    DOB: 1955/07/05  Age: 68 y.o. MRN: [id number]  Chief Complaint  Patient presents with   Acne    Boil on back- seen 2 days ago , rash on neck- started 4 days ago    HPI   Peter Snyder is seen with possible boil on his back.  Noted two days ago.  No pain.  His wife noticed this.  Denies any fevers or chills.  No drainage.  No recent injury.  Also relates pruritus and dryness lateral neck region especially on the left side.  He has also noticed some scaly rash behind the left ear.  He has some triamcinolone cream which he has used on the neck rash.  He has had very exhausting and busy week caring for his wife who had complicated diverticulitis requiring admission  Past Medical History:  Diagnosis Date   Allergy    Allergy to sulfa drugs 12/10/2020   Arthritis    CAD (coronary artery disease) 6/14   Cardiac catheterization 6/27/ 2014 ejection fraction 35-40%, 30% proximal left circumflex, 100% tiny obtuse marginal 1 with collaterals, 50% LAD, 50% D1, 100% RCA with collaterals   Chicken pox    Chronic back pain    Colon polyps    Depression    Diabetes mellitus (HCC)    Diet controlled- OFF metformin 02-01-20   E coli infection 12/10/2020   Folliculitis 12/10/2020   GERD (gastroesophageal reflux disease)    Hyperlipidemia    Hypertension    Kidney stone    Myocardial infarction [facility])    ? year- cath 2014/ june    Past Surgical History:  Procedure Laterality Date   COLONOSCOPY     CRYO INTERCOSTAL NERVE BLOCK     double sacrum nerve block   epidural injections     multiple- L5 S1 and cervical neck pain issues as well    POLYPECTOMY     TONSILLECTOMY  1963   TRIGGER FINGER RELEASE  2019    reports that he quit smoking about 25 years ago. His smoking use included cigarettes. He started smoking about 53 years ago. He has a 7 pack-year smoking history. He has never used smokeless tobacco. He reports  that he does not drink alcohol and does not use drugs. family history includes Arthritis in his father and paternal grandmother; COPD in his father; Cancer in his mother; Clotting disorder in his paternal grandfather and paternal grandmother; Colon polyps in his father; Heart disease in his maternal grandmother; Hyperlipidemia in his maternal grandmother; Hypertension in his mother; Liver cancer in his paternal grandfather; Melanoma in his mother. Allergies  Allergen Reactions   Doxycycline Hyclate Nausea And Vomiting   Latex Other (See Comments)    Other   Tape     Use Paper Tape    Sulfa Antibiotics Rash    Review of Systems  Constitutional:  Negative for chills and fever.  Skin:  Positive for rash.      Objective:     BP 112/74 (BP Location: Left Arm, Patient Position: Sitting, Cuff Size: Large)   Pulse 66   Temp 97.6 F (36.4 C) (Oral)   Ht 6\' 2"  (1.88 m)   Wt 214 lb 14.4 oz (97.5 kg)   SpO2 98%   BMI 27.59 kg/m  BP Readings from Last 3 Encounters:  04/07/23 112/74  02/23/23 110/60  02/04/23 124/60   Wt Readings from Last 3 Encounters:  04/07/23 214 lb 14.4 oz (97.5 kg)  02/23/23 216 lb 3.2 oz (98.1 kg)  02/04/23 215 lb 9.6 oz (97.8 kg)      Physical Exam Vitals reviewed.  Constitutional:      General: He is not in acute distress.    Appearance: Normal appearance. He is not ill-appearing.  Cardiovascular:     Rate and Rhythm: Normal rate and regular rhythm.  Skin:    Comments: Right mid thoracic back area reveals approximately 5 x 5 mm area of erythema slightly raised but nonfluctuant.  Small umbilication near 1 side consistent with epidermal cyst.  No surrounding cellulitis changes  He has small flaky skin rash behind the left ear and some dryness left lower neck  Neurological:     Mental Status: He is alert.      No results found for any visits on 04/07/23.    The 10-year ASCVD risk score (Arnett DK, et al., 2019) is: 21.3%    Assessment & Plan:    #1 inflamed epidermal cyst right thoracic back.  Nonfluctuant.  Recommend using some low-grade heat couple times daily and watch for any progressive erythema or swelling or fluctuance.  No indication for I&D at this time  #2 dry skin dermatitis/eczema involving neck.  Continue triamcinolone cream.  May combine with moisturizer 50-50  Evelena Peat, MD

## 2023-04-07 NOTE — Patient Instructions (Signed)
Suspect inflamed sebaceous cyst  Use some low grade heat (heating pad) couple of times per day.

## 2023-04-10 ENCOUNTER — Encounter: Payer: Self-pay | Admitting: Family Medicine

## 2023-05-04 ENCOUNTER — Encounter: Payer: Self-pay | Admitting: Family Medicine

## 2023-05-04 ENCOUNTER — Ambulatory Visit (INDEPENDENT_AMBULATORY_CARE_PROVIDER_SITE_OTHER): Payer: Medicare Other | Admitting: Family Medicine

## 2023-05-04 VITALS — BP 100/60 | HR 71 | Temp 98.4°F | Ht 74.0 in | Wt 212.8 lb

## 2023-05-04 DIAGNOSIS — E611 Iron deficiency: Secondary | ICD-10-CM

## 2023-05-04 DIAGNOSIS — R972 Elevated prostate specific antigen [PSA]: Secondary | ICD-10-CM

## 2023-05-04 DIAGNOSIS — E785 Hyperlipidemia, unspecified: Secondary | ICD-10-CM

## 2023-05-04 DIAGNOSIS — Z125 Encounter for screening for malignant neoplasm of prostate: Secondary | ICD-10-CM

## 2023-05-04 DIAGNOSIS — I10 Essential (primary) hypertension: Secondary | ICD-10-CM | POA: Diagnosis not present

## 2023-05-04 DIAGNOSIS — E1165 Type 2 diabetes mellitus with hyperglycemia: Secondary | ICD-10-CM

## 2023-05-04 LAB — COMPREHENSIVE METABOLIC PANEL
ALT: 16 U/L (ref 0–53)
AST: 16 U/L (ref 0–37)
Albumin: 4.3 g/dL (ref 3.5–5.2)
Alkaline Phosphatase: 64 U/L (ref 39–117)
BUN: 23 mg/dL (ref 6–23)
CO2: 31 meq/L (ref 19–32)
Calcium: 8.8 mg/dL (ref 8.4–10.5)
Chloride: 101 meq/L (ref 96–112)
Creatinine, Ser: 1.04 mg/dL (ref 0.40–1.50)
GFR: 74.06 mL/min (ref >60.00)
Glucose, Bld: 127 mg/dL — ABNORMAL HIGH (ref 70–99)
Potassium: 4.3 meq/L (ref 3.5–5.1)
Sodium: 139 meq/L (ref 135–145)
Total Bilirubin: 0.5 mg/dL (ref 0.2–1.2)
Total Protein: 6.5 g/dL (ref 6.0–8.3)

## 2023-05-04 LAB — POCT GLYCOSYLATED HEMOGLOBIN (HGB A1C): Hemoglobin A1C: 6.3 % — AB (ref 4.0–5.6)

## 2023-05-04 LAB — CBC WITH DIFFERENTIAL/PLATELET
Basophils Absolute: 0 10*3/uL (ref 0.0–0.1)
Basophils Relative: 0.8 % (ref 0.0–3.0)
Eosinophils Absolute: 0.1 10*3/uL (ref 0.0–0.7)
Eosinophils Relative: 1.8 % (ref 0.0–5.0)
HCT: 39.1 % (ref 39.0–52.0)
Hemoglobin: 13.2 g/dL (ref 13.0–17.0)
Lymphocytes Relative: 31.1 % (ref 12.0–46.0)
Lymphs Abs: 1.8 10*3/uL (ref 0.7–4.0)
MCHC: 33.7 g/dL (ref 30.0–36.0)
MCV: 91.1 fL (ref 78.0–100.0)
Monocytes Absolute: 0.5 10*3/uL (ref 0.1–1.0)
Monocytes Relative: 8.5 % (ref 3.0–12.0)
Neutro Abs: 3.3 10*3/uL (ref 1.4–7.7)
Neutrophils Relative %: 57.8 % (ref 43.0–77.0)
Platelets: 136 10*3/uL — ABNORMAL LOW (ref 150.0–400.0)
RBC: 4.29 Mil/uL (ref 4.22–5.81)
RDW: 13.5 % (ref 11.5–15.5)
WBC: 5.7 10*3/uL (ref 4.0–10.5)

## 2023-05-04 LAB — LIPID PANEL
Cholesterol: 138 mg/dL (ref 0–200)
HDL: 38 mg/dL — ABNORMAL LOW (ref >39.00)
LDL Cholesterol: 79 mg/dL (ref 0–99)
NonHDL: 100.13
Total CHOL/HDL Ratio: 4
Triglycerides: 108 mg/dL (ref 0.0–149.0)
VLDL: 21.6 mg/dL (ref 0.0–40.0)

## 2023-05-04 LAB — MICROALBUMIN / CREATININE URINE RATIO
Creatinine,U: 121.3 mg/dL
Microalb Creat Ratio: 26.1 mg/g (ref 0.0–30.0)
Microalb, Ur: 3.2 mg/dL — ABNORMAL HIGH (ref 0.0–1.9)

## 2023-05-04 LAB — PSA, MEDICARE: PSA: 2.92 ng/mL (ref 0.10–4.00)

## 2023-05-04 MED ORDER — GABAPENTIN 600 MG PO TABS: 600.0000 mg | ORAL_TABLET | Freq: Two times a day (BID) | ORAL | 1 refills | Status: DC

## 2023-05-04 NOTE — Addendum Note (Signed)
Addended by: Christy Sartorius on: 05/04/2023 01:13 PM   Modules accepted: Orders

## 2023-05-04 NOTE — Progress Notes (Signed)
Established Patient Office Visit  Subjective   Patient ID: Peter Snyder, male    DOB: 07/19/1955  Age: 68 y.o. MRN: [id number]  Chief Complaint  Patient presents with   Annual Exam    HPI   [name] is here for medical follow-up.  He was initially scheduled for a "complete physical ".  However, he has Medicare primary.  We addressed follow-up of multiple medical problems.  He has history of CAD, hypertension, ischemic cardiomyopathy, GERD, type 2 diabetes, essential tremor, degenerative arthritis, BPH, chronic insomnia, history of recurrent depression, dyslipidemia.  Followed by multiple specialists including cardiology, psychiatry, and dermatology.  He gets Derm checks every few months  Generally doing well.  He continues to have issues with insomnia.  He is still exercising with about 6 hours/week between biking and walking.  No recent chest pains.  No significant dyspnea.  No peripheral edema.  Blood sugars have generally been well-controlled.  A1c was up to 6.8 last year but came back down to 6.1% last check.  Does have iron deficiency with low ferritin but normal hemoglobin.  Chronic PPI use may be contributing.  He had upper and lower endoscopy with no worrisome lesions  Past Medical History:  Diagnosis Date   Allergy    Allergy to sulfa drugs 12/10/2020   Arthritis    CAD (coronary artery disease) 6/14   Cardiac catheterization 6/27/ 2014 ejection fraction 35-40%, 30% proximal left circumflex, 100% tiny obtuse marginal 1 with collaterals, 50% LAD, 50% D1, 100% RCA with collaterals   Chicken pox    Chronic back pain    Colon polyps    Depression    Diabetes mellitus (HCC)    Diet controlled- OFF metformin 02-01-20   E coli infection 12/10/2020   Folliculitis 12/10/2020   GERD (gastroesophageal reflux disease)    Hyperlipidemia    Hypertension    Kidney stone    Myocardial infarction [facility])    ? year- cath 2014/ june    Past Surgical History:  Procedure Laterality Date    COLONOSCOPY     CRYO INTERCOSTAL NERVE BLOCK     double sacrum nerve block   epidural injections     multiple- L5 S1 and cervical neck pain issues as well    POLYPECTOMY     TONSILLECTOMY  1963   TRIGGER FINGER RELEASE  2019    reports that he quit smoking about 25 years ago. His smoking use included cigarettes. He started smoking about 53 years ago. He has a 7 pack-year smoking history. He has never used smokeless tobacco. He reports that he does not drink alcohol and does not use drugs. family history includes Arthritis in his father and paternal grandmother; COPD in his father; Cancer in his mother; Clotting disorder in his paternal grandfather and paternal grandmother; Colon polyps in his father; Heart disease in his maternal grandmother; Hyperlipidemia in his maternal grandmother; Hypertension in his mother; Liver cancer in his paternal grandfather; Melanoma in his mother. Allergies  Allergen Reactions   Doxycycline Hyclate Nausea And Vomiting   Latex Other (See Comments)    Other   Tape     Use Paper Tape    Sulfa Antibiotics Rash    Review of Systems  Constitutional:  Negative for malaise/fatigue.  Eyes:  Negative for blurred vision.  Respiratory:  Negative for shortness of breath.   Cardiovascular:  Negative for chest pain.  Neurological:  Negative for dizziness, weakness and headaches.      Objective:  BP 100/60 (BP Location: Left Arm, Patient Position: Sitting, Cuff Size: Normal)   Pulse 71   Temp 98.4 F (36.9 C) (Oral)   Ht 6\' 2"  (1.88 m)   Wt 212 lb 12.8 oz (96.5 kg)   SpO2 97%   BMI 27.32 kg/m  BP Readings from Last 3 Encounters:  05/04/23 100/60  04/07/23 112/74  02/23/23 110/60   Wt Readings from Last 3 Encounters:  05/04/23 212 lb 12.8 oz (96.5 kg)  04/07/23 214 lb 14.4 oz (97.5 kg)  02/23/23 216 lb 3.2 oz (98.1 kg)      Physical Exam Vitals reviewed.  Constitutional:      Appearance: He is well-developed.  HENT:     Right Ear: External  ear normal.     Left Ear: External ear normal.  Eyes:     Pupils: Pupils are equal, round, and reactive to light.  Neck:     Thyroid: No thyromegaly.  Cardiovascular:     Rate and Rhythm: Normal rate and regular rhythm.  Pulmonary:     Effort: Pulmonary effort is normal. No respiratory distress.     Breath sounds: Normal breath sounds. No wheezing or rales.  Musculoskeletal:     Cervical back: Neck supple.     Right lower leg: No edema.     Left lower leg: No edema.  Skin:    Comments: Feet reveal no skin lesions. Good distal foot pulses. Good capillary refill.  Small callus medial aspect of the ball of the right foot.  No ulceration.  Normal sensation with monofilament testing   Neurological:     Mental Status: He is alert and oriented to person, place, and time.      Results for orders placed or performed in visit on 05/04/23  POC HgB A1c  Result Value Ref Range   Hemoglobin A1C 6.3 (A) 4.0 - 5.6 %   HbA1c POC (<> result, manual entry)     HbA1c, POC (prediabetic range)     HbA1c, POC (controlled diabetic range)      Last CBC Lab Results  Component Value Date   WBC 5.9 06/10/2022   HGB 13.5 06/10/2022   HCT 39.8 06/10/2022   MCV 92.2 06/10/2022   MCH 30.6 12/18/2021   RDW 13.0 06/10/2022   PLT 138.0 (L) 06/10/2022   Last metabolic panel Lab Results  Component Value Date   GLUCOSE 102 (H) 10/30/2022   NA 137 10/30/2022   K 4.4 10/30/2022   CL 100 10/30/2022   CO2 24 10/30/2022   BUN 19 10/30/2022   CREATININE 0.93 10/30/2022   EGFR 90 10/30/2022   CALCIUM 9.3 10/30/2022   PROT 6.7 05/02/2022   ALBUMIN 4.2 05/02/2022   BILITOT 0.6 05/02/2022   ALKPHOS 49 05/02/2022   AST 17 05/02/2022   ALT 16 05/02/2022   ANIONGAP 7 12/18/2021   Last lipids Lab Results  Component Value Date   CHOL 131 05/02/2022   HDL 41.20 05/02/2022   LDLCALC 66 05/02/2022   LDLDIRECT 70.0 03/20/2016   TRIG 116.0 05/02/2022   CHOLHDL 3 05/02/2022   Last hemoglobin A1c Lab  Results  Component Value Date   HGBA1C 6.3 (A) 05/04/2023      The 10-year ASCVD risk score (Arnett DK, et al., 2019) is: 17.8%    Assessment & Plan:   Problem List Items Addressed This Visit       Unprioritized   Type 2 diabetes mellitus with hyperglycemia (HCC) - Primary   Relevant Orders  POC HgB A1c (Completed)   Microalbumin / creatinine urine ratio   Essential hypertension, benign   Dyslipidemia   Relevant Orders   Lipid panel   CMP   Other Visit Diagnoses       Iron deficiency       Relevant Orders   CBC with Differential/Platelet     Prostate cancer screening       Relevant Orders   PSA, Medicare     A1c today 6.3%.  Continue lifestyle management.  Consider repeat A1c in about 6 months  Continue regular exercise habits  We discussed pros and cons of PSA screening and patient would like to proceed.  Follow-up labs as above including lipids and CMP.  He does have a history of chronic mild thrombocytopenia and iron deficiency and we will recheck CBC.  Of maintenance reviewed.  Immunizations up-to-date with exception of RSV vaccine.  No follow-ups on file.    Evelena Peat, MD

## 2023-05-04 NOTE — Telephone Encounter (Signed)
 Please see result note

## 2023-05-05 ENCOUNTER — Encounter: Payer: Self-pay | Admitting: Family Medicine

## 2023-05-08 ENCOUNTER — Other Ambulatory Visit: Payer: Self-pay | Admitting: Cardiology

## 2023-05-09 ENCOUNTER — Other Ambulatory Visit: Payer: Self-pay | Admitting: Family Medicine

## 2023-05-11 ENCOUNTER — Telehealth (INDEPENDENT_AMBULATORY_CARE_PROVIDER_SITE_OTHER): Payer: Medicare Other | Admitting: Family Medicine

## 2023-05-11 ENCOUNTER — Encounter: Payer: Self-pay | Admitting: Family Medicine

## 2023-05-11 DIAGNOSIS — E1165 Type 2 diabetes mellitus with hyperglycemia: Secondary | ICD-10-CM

## 2023-05-11 NOTE — Progress Notes (Signed)
 Patient ID: Peter Snyder, male   DOB: [birth year], 68 y.o.   MRN: [id number]   Virtual Visit via Video Note  I connected with [facility] on 05/11/23 at  9:45 AM EST by a video enabled telemedicine application and verified that I am speaking with the correct person using two identifiers.  Location patient: home Location provider:work or home office Persons participating in the virtual visit: patient, provider  I discussed the limitations of evaluation and management by telemedicine and the availability of in person appointments. The patient expressed understanding and agreed to proceed.   HPI:  Peter Snyder is seen today for a couple of items.  First, he states he developed possible "GI bug "last Thursday.  He went out to eat.  Shortly afterwards he developed some bodyaches and nausea without vomiting.  Went to urgent care.  Reportedly tested negative for flu.  Never developed any diarrhea.  His nausea has improved and in fact he actually exercised some yesterday for about 45 minutes.  No fever.  No abdominal pain.  Still has some mild fatigue.  No upper respiratory symptoms.  He set up this visit to discuss some recent abnormal labs.  He has chronic mildly low platelets which are stable.  PSA 2.92 which was up from 1.53 previously.  He does have a history of BPH.  Occasional slow stream.  He had urine microalbumin screen which came back with microalbumin creatinine ratio of 26 which is up from 2.6 previously.  He does take Entresto regularly.  Blood sugars have been well-controlled with most recent A1c 6.3%.    ROS: See pertinent positives and negatives per HPI.  Past Medical History:  Diagnosis Date   Allergy    Allergy to sulfa drugs 12/10/2020   Arthritis    CAD (coronary artery disease) 6/14   Cardiac catheterization 6/27/ 2014 ejection fraction 35-40%, 30% proximal left circumflex, 100% tiny obtuse marginal 1 with collaterals, 50% LAD, 50% D1, 100% RCA with collaterals   Chicken pox     Chronic back pain    Colon polyps    Depression    Diabetes mellitus (HCC)    Diet controlled- OFF metformin 02-01-20   E coli infection 12/10/2020   Folliculitis 12/10/2020   GERD (gastroesophageal reflux disease)    Hyperlipidemia    Hypertension    Kidney stone    Myocardial infarction [facility])    ? year- cath 2014/ june     Past Surgical History:  Procedure Laterality Date   COLONOSCOPY     CRYO INTERCOSTAL NERVE BLOCK     double sacrum nerve block   epidural injections     multiple- L5 S1 and cervical neck pain issues as well    POLYPECTOMY     TONSILLECTOMY  1963   TRIGGER FINGER RELEASE  2019    Family History  Problem Relation Age of Onset   Melanoma Mother        metastatic   Cancer Mother        melanoma   Hypertension Mother    Arthritis Father    Colon polyps Father    COPD Father    Hyperlipidemia Maternal Grandmother    Heart disease Maternal Grandmother    Arthritis Paternal Grandmother    Clotting disorder Paternal Grandmother    Clotting disorder Paternal Grandfather    Liver cancer Paternal Grandfather    Colon cancer Neg Hx    Esophageal cancer Neg Hx    Rectal cancer Neg Hx  Stomach cancer Neg Hx     SOCIAL HX: non-Smoker   Current Outpatient Medications:    acetaminophen (TYLENOL) 500 MG tablet, Take 500 mg by mouth every 6 (six) hours as needed for moderate pain., Disp: , Rfl:    ALPRAZolam (XANAX) 0.25 MG tablet, Take 0.25 mg by mouth daily as needed for anxiety., Disp: , Rfl:    aspirin 81 MG tablet, Take 81 mg by mouth daily., Disp: , Rfl:    atorvastatin (LIPITOR) 80 MG tablet, TAKE 1 TABLET BY MOUTH EVERY DAY, Disp: 90 tablet, Rfl: 1   Azelastine HCl 137 MCG/SPRAY SOLN, PLACE 1 SPRAY INTO BOTH NOSTRILS 2 TIMES DAILY AS DIRECTED, Disp: 90 mL, Rfl: 1   carvedilol (COREG) 12.5 MG tablet, TAKE 1 TABLET TWICE A DAY WITH A MEAL, Disp: 180 tablet, Rfl: 1   diclofenac Sodium (VOLTAREN) 1 % GEL, Apply 2 g topically daily as needed (for pain).,  Disp: , Rfl:    escitalopram (LEXAPRO) 20 MG tablet, Take 20 mg by mouth at bedtime., Disp: , Rfl:    ezetimibe (ZETIA) 10 MG tablet, TAKE 1 TABLET BY MOUTH EVERY DAY, Disp: 90 tablet, Rfl: 1   gabapentin (NEURONTIN) 600 MG tablet, Take 1 tablet (600 mg total) by mouth 2 (two) times daily., Disp: 180 tablet, Rfl: 1   LOTEMAX 0.5 % GEL, SMARTSIG:1 Drop(s) In Eye(s) Twice Daily PRN, Disp: , Rfl:    pantoprazole (PROTONIX) 40 MG tablet, TAKE 1 TABLET BY MOUTH TWICE A DAY, Disp: 180 tablet, Rfl: 3   pimecrolimus (ELIDEL) 1 % cream, 1 Application as needed., Disp: , Rfl:    RESTASIS 0.05 % ophthalmic emulsion, Place 1 drop into both eyes 2 (two) times daily. , Disp: , Rfl:    sacubitril-valsartan (ENTRESTO) 49-51 MG, TAKE 1 TABLET BY MOUTH TWICE A DAY, Disp: 180 tablet, Rfl: 2   tamsulosin (FLOMAX) 0.4 MG CAPS capsule, TAKE 1 CAPSULE EVERY DAY AFTER SUPPER, Disp: 90 capsule, Rfl: 1   triamcinolone cream (KENALOG) 0.1 %, APPLY TOPICALLY TO THE AFFECTED AREA 2 (TWO) TIMES DAILY AS NEEDED., Disp: 60 g, Rfl: 1   ULTRAM 50 MG tablet, Take 50 mg by mouth 3 (three) times daily as needed., Disp: , Rfl:    zolpidem (AMBIEN) 10 MG tablet, Take 10 mg by mouth at bedtime as needed for sleep., Disp: , Rfl:   EXAM:  VITALS per patient if applicable:  GENERAL: alert, oriented, appears well and in no acute distress  HEENT: atraumatic, conjunttiva clear, no obvious abnormalities on inspection of external nose and ears  NECK: normal movements of the head and neck  LUNGS: on inspection no signs of respiratory distress, breathing rate appears normal, no obvious gross SOB, gasping or wheezing  CV: no obvious cyanosis  MS: moves all visible extremities without noticeable abnormality  PSYCH/NEURO: pleasant and cooperative, no obvious depression or anxiety, speech and thought processing grossly intact  ASSESSMENT AND PLAN:  Discussed the following assessment and plan:  #1 history of type 2 diabetes.  Recent  A1c 6.3%.  Microalbumin screen revealed microalbumin creatinine ratio 26 which is up from previous.  We recommend observation and recheck in about 6 months.  He already takes [place].  Did not see any reason to add SGLT2 medication yet but if proteinuria increases consider possible use of Jardiance  #2 recent PSA 2.92.  We reiterated this is normal range.  Even this did increase from 1.53 previously his last value was about 2 years ago.  He does  have history of known BPH.  We decided to go and recheck PSA in about 6 months to assess stability.     I discussed the assessment and treatment plan with the patient. The patient was provided an opportunity to ask questions and all were answered. The patient agreed with the plan and demonstrated an understanding of the instructions.   The patient was advised to call back or seek an in-person evaluation if the symptoms worsen or if the condition fails to improve as anticipated.     Evelena Peat, MD

## 2023-05-12 ENCOUNTER — Encounter: Payer: Self-pay | Admitting: Family Medicine

## 2023-05-13 ENCOUNTER — Encounter: Payer: Self-pay | Admitting: Family Medicine

## 2023-05-15 ENCOUNTER — Ambulatory Visit (INDEPENDENT_AMBULATORY_CARE_PROVIDER_SITE_OTHER): Payer: Medicare Other | Admitting: Family Medicine

## 2023-05-15 ENCOUNTER — Encounter: Payer: Self-pay | Admitting: Family Medicine

## 2023-05-15 VITALS — BP 114/60 | HR 69 | Temp 97.9°F | Wt 212.2 lb

## 2023-05-15 DIAGNOSIS — B349 Viral infection, unspecified: Secondary | ICD-10-CM

## 2023-05-15 NOTE — Progress Notes (Signed)
 Established Patient Office Visit  Subjective   Patient ID: Peter Snyder, male    DOB: Aug 31, 1955  Age: 68 y.o. MRN: [id number]  Chief Complaint  Patient presents with   Nausea    HPI   Peter Snyder is seen following recent probable viral syndrome last week.  He had some nausea without vomiting.  No diarrhea.  Did have some mild bodyaches and headaches.  Went to urgent care.  Influenza testing was negative.  Denied any abdominal pain.  Has improved somewhat over the weekend and into this week.  Did actually try to workout for about 30 minutes on Sunday.  Nausea has improved.  His main complaint now is just generalized lethargy and fatigue.  He has gradually been increasing his diet.  Somewhat less stamina with exercise.  No reported fever. He had little bit of lightheadedness and wonder if he may have been a little hypotensive with recent illness and decreased fluid intake.  Blood pressure stable today.  Past Medical History:  Diagnosis Date   Allergy    Allergy to sulfa drugs 12/10/2020   Arthritis    CAD (coronary artery disease) 6/14   Cardiac catheterization 6/27/ 2014 ejection fraction 35-40%, 30% proximal left circumflex, 100% tiny obtuse marginal 1 with collaterals, 50% LAD, 50% D1, 100% RCA with collaterals   Chicken pox    Chronic back pain    Colon polyps    Depression    Diabetes mellitus (HCC)    Diet controlled- OFF metformin 02-01-20   E coli infection 12/10/2020   Folliculitis 12/10/2020   GERD (gastroesophageal reflux disease)    Hyperlipidemia    Hypertension    Kidney stone    Myocardial infarction [facility])    ? year- cath 2014/ june    Past Surgical History:  Procedure Laterality Date   COLONOSCOPY     CRYO INTERCOSTAL NERVE BLOCK     double sacrum nerve block   epidural injections     multiple- L5 S1 and cervical neck pain issues as well    POLYPECTOMY     TONSILLECTOMY  1963   TRIGGER FINGER RELEASE  2019    reports that he quit smoking about 25 years ago.  His smoking use included cigarettes. He started smoking about 53 years ago. He has a 7 pack-year smoking history. He has never used smokeless tobacco. He reports that he does not drink alcohol and does not use drugs. family history includes Arthritis in his father and paternal grandmother; COPD in his father; Cancer in his mother; Clotting disorder in his paternal grandfather and paternal grandmother; Colon polyps in his father; Heart disease in his maternal grandmother; Hyperlipidemia in his maternal grandmother; Hypertension in his mother; Liver cancer in his paternal grandfather; Melanoma in his mother. Allergies  Allergen Reactions   Doxycycline Hyclate Nausea And Vomiting   Latex Other (See Comments)    Other   Tape     Use Paper Tape    Sulfa Antibiotics Rash    Review of Systems  Constitutional:  Positive for malaise/fatigue. Negative for chills and fever.  Respiratory:  Negative for cough and shortness of breath.   Cardiovascular:  Negative for chest pain.  Gastrointestinal:  Negative for abdominal pain.  Genitourinary:  Negative for dysuria.      Objective:     BP 114/60 (BP Location: Left Arm, Patient Position: Sitting, Cuff Size: Normal)   Pulse 69   Temp 97.9 F (36.6 C) (Oral)   Wt 212 lb 3.2 oz (  96.3 kg)   SpO2 99%   BMI 27.24 kg/m  BP Readings from Last 3 Encounters:  05/15/23 114/60  05/04/23 100/60  04/07/23 112/74   Wt Readings from Last 3 Encounters:  05/15/23 212 lb 3.2 oz (96.3 kg)  05/04/23 212 lb 12.8 oz (96.5 kg)  04/07/23 214 lb 14.4 oz (97.5 kg)      Physical Exam Vitals reviewed.  Constitutional:      General: He is not in acute distress.    Appearance: He is not ill-appearing.  Cardiovascular:     Rate and Rhythm: Normal rate and regular rhythm.  Pulmonary:     Effort: Pulmonary effort is normal.     Breath sounds: Normal breath sounds. No wheezing or rales.  Musculoskeletal:     Right lower leg: No edema.     Left lower leg: No  edema.  Neurological:     Mental Status: He is alert.      No results found for any visits on 05/15/23.    The 10-year ASCVD risk score (Arnett DK, et al., 2019) is: 25.2%    Assessment & Plan:   Probable recent viral syndrome.  Overall symptoms are improving but he has some residual malaise.  Plenty of rest and fluids and gradually progress diet as tolerated.  He will gradually try to increase his activity levels.  Follow-up promptly for any fever or other concerns  Evelena Peat, MD

## 2023-05-15 NOTE — Patient Instructions (Signed)
 Stay well hydrated  Slowly increase your exercise levels.

## 2023-05-16 HISTORY — PX: CATARACT EXTRACTION: SUR2

## 2023-05-25 ENCOUNTER — Other Ambulatory Visit: Payer: Self-pay | Admitting: Family Medicine

## 2023-05-25 DIAGNOSIS — J019 Acute sinusitis, unspecified: Secondary | ICD-10-CM

## 2023-05-26 ENCOUNTER — Telehealth: Payer: Self-pay

## 2023-05-26 ENCOUNTER — Encounter: Payer: Self-pay | Admitting: Family Medicine

## 2023-05-26 NOTE — Telephone Encounter (Signed)
 Copied from CRM [id number]. Topic: Clinical - Lab/Test Results >> May 26, 2023  4:11 PM Jon Gills C wrote: Reason for CRM: Patient called in wanting someone to give her a call regarding the uacr test, patient stated he calculated himself and and got something way higher, would like for someone to call him about this issue

## 2023-05-27 ENCOUNTER — Encounter: Payer: Self-pay | Admitting: Family Medicine

## 2023-05-27 ENCOUNTER — Ambulatory Visit (INDEPENDENT_AMBULATORY_CARE_PROVIDER_SITE_OTHER): Admitting: Family Medicine

## 2023-05-27 VITALS — BP 110/60 | HR 75 | Temp 97.9°F | Wt 209.8 lb

## 2023-05-27 DIAGNOSIS — E1165 Type 2 diabetes mellitus with hyperglycemia: Secondary | ICD-10-CM | POA: Diagnosis not present

## 2023-05-27 NOTE — Progress Notes (Signed)
 Established Patient Office Visit  Subjective   Patient ID: Peter Snyder, male    DOB: October 23, 1955  Age: 68 y.o. MRN: [id number]  Chief Complaint  Patient presents with   Medical Management of Chronic Issues    HPI   Peter Snyder is here to discuss recent lab results.  He had recent microalbumin creatinine ratio of 26 which was surprising as he has had very well-controlled diabetes in previous ratio a year ago was 2.6.  He was aware of recent lab issues with inaccuracies of reporting the ratio that were brought to his attention recently.  It looks like he is computed microalbumin creatinine ratio should have been 2.6 based on the numbers.  In any event, he is already on Entresto and overall doing well.  A1c's consistently low 6 range.  He did have recent PSA still normal at 2.91 but up from 1.53 from a year ago.  We had suggested 65-month follow-up.  He does not have any significant obstructive urinary symptoms.  He had cataract surgery last Thursday left eye which went well.  He has scheduled cataract surgery right eye out about 2 weeks.  Past Medical History:  Diagnosis Date   Allergy    Allergy to sulfa drugs 12/10/2020   Arthritis    CAD (coronary artery disease) 6/14   Cardiac catheterization 6/27/ 2014 ejection fraction 35-40%, 30% proximal left circumflex, 100% tiny obtuse marginal 1 with collaterals, 50% LAD, 50% D1, 100% RCA with collaterals   Chicken pox    Chronic back pain    Colon polyps    Depression    Diabetes mellitus (HCC)    Diet controlled- OFF metformin 02-01-20   E coli infection 12/10/2020   Folliculitis 12/10/2020   GERD (gastroesophageal reflux disease)    Hyperlipidemia    Hypertension    Kidney stone    Myocardial infarction [facility])    ? year- cath 2014/ june    Past Surgical History:  Procedure Laterality Date   COLONOSCOPY     CRYO INTERCOSTAL NERVE BLOCK     double sacrum nerve block   epidural injections     multiple- L5 S1 and cervical neck pain  issues as well    POLYPECTOMY     TONSILLECTOMY  1963   TRIGGER FINGER RELEASE  2019    reports that he quit smoking about 25 years ago. His smoking use included cigarettes. He started smoking about 53 years ago. He has a 7 pack-year smoking history. He has never used smokeless tobacco. He reports that he does not drink alcohol and does not use drugs. family history includes Arthritis in his father and paternal grandmother; COPD in his father; Cancer in his mother; Clotting disorder in his paternal grandfather and paternal grandmother; Colon polyps in his father; Heart disease in his maternal grandmother; Hyperlipidemia in his maternal grandmother; Hypertension in his mother; Liver cancer in his paternal grandfather; Melanoma in his mother. Allergies  Allergen Reactions   Doxycycline Hyclate Nausea And Vomiting   Latex Other (See Comments)    Other   Tape     Use Paper Tape    Sulfa Antibiotics Rash    Review of Systems  Constitutional:  Negative for chills and fever.  Cardiovascular:  Negative for chest pain.  Genitourinary:  Negative for dysuria.      Objective:     BP 110/60 (BP Location: Left Arm, Patient Position: Sitting, Cuff Size: Normal)   Pulse 75   Temp 97.9 F (36.6 C) (Oral)  Wt 209 lb 12.8 oz (95.2 kg)   SpO2 97%   BMI 26.94 kg/m  BP Readings from Last 3 Encounters:  05/27/23 110/60  05/15/23 114/60  05/04/23 100/60   Wt Readings from Last 3 Encounters:  05/27/23 209 lb 12.8 oz (95.2 kg)  05/15/23 212 lb 3.2 oz (96.3 kg)  05/04/23 212 lb 12.8 oz (96.5 kg)      Physical Exam Vitals reviewed.  Constitutional:      General: He is not in acute distress.    Appearance: He is not ill-appearing.  Cardiovascular:     Rate and Rhythm: Normal rate and regular rhythm.  Pulmonary:     Effort: Pulmonary effort is normal.     Breath sounds: Normal breath sounds. No wheezing or rales.  Neurological:     Mental Status: He is alert.      No results found  for any visits on 05/27/23.    The 10-year ASCVD risk score (Arnett DK, et al., 2019) is: 23.9%    Assessment & Plan:   #1 history of type 2 diabetes which has been very well-controlled.  Recent reported microalbumin creatinine ratio 26.  We did discuss repeating this and about 4 more months-but would be surprised if he has significant microalbuminuria with his well-controlled diabetes.  He also takes valsartan regularly.  #2 increased PSA velocity.  Recheck PSA in about 4 months.  Evelena Peat, MD

## 2023-05-27 NOTE — Telephone Encounter (Signed)
Patient has appt to discuss

## 2023-05-27 NOTE — Patient Instructions (Signed)
 Repeat PSA in about 4 months.

## 2023-05-27 NOTE — Telephone Encounter (Signed)
 Noted.

## 2023-06-01 ENCOUNTER — Encounter: Payer: Self-pay | Admitting: Family Medicine

## 2023-06-01 ENCOUNTER — Ambulatory Visit (INDEPENDENT_AMBULATORY_CARE_PROVIDER_SITE_OTHER): Admitting: Family Medicine

## 2023-06-01 VITALS — BP 90/60 | HR 65 | Wt 210.1 lb

## 2023-06-01 DIAGNOSIS — K219 Gastro-esophageal reflux disease without esophagitis: Secondary | ICD-10-CM | POA: Diagnosis not present

## 2023-06-01 DIAGNOSIS — R1310 Dysphagia, unspecified: Secondary | ICD-10-CM

## 2023-06-01 MED ORDER — SUCRALFATE 1 G PO TABS: 1.0000 g | ORAL_TABLET | Freq: Three times a day (TID) | ORAL | 1 refills | Status: DC

## 2023-06-01 NOTE — Progress Notes (Signed)
 Established Patient Office Visit  Subjective   Patient ID: Peter Snyder, male    DOB: 1955/06/17  Age: 68 y.o. MRN: [id number]  Chief Complaint  Patient presents with   Gastroesophageal Reflux    HPI    Hurley is seen with substernal burning with swallowing especially over the past weekend.  He has longstanding history of GERD and at baseline takes Protonix 40 mg twice daily.  He also supplemented with some Tagamet over the weekend without much benefit.  He especially noted the symptoms after drinking some Coke and also after eating some chicken.  Did not have any significant solid food dysphagia.  He does take some Aleve and recently been taking this twice daily.  No other major dietary changes.  Over the weekend try to a very bland food.  He has been able to keep down some fluids.  Denies any recent exertional chest pains.  He had upper endoscopy just a little over a year ago secondary to iron deficiency.  This is essentially normal with normal esophagus and only a few small sessile fundic gland polyps were noted.  No ulcers.  No regular alcohol.  He denies any hematemesis or melena.  No history of Candida esophagitis.  Past Medical History:  Diagnosis Date   Allergy    Allergy to sulfa drugs 12/10/2020   Arthritis    CAD (coronary artery disease) 6/14   Cardiac catheterization 6/27/ 2014 ejection fraction 35-40%, 30% proximal left circumflex, 100% tiny obtuse marginal 1 with collaterals, 50% LAD, 50% D1, 100% RCA with collaterals   Chicken pox    Chronic back pain    Colon polyps    Depression    Diabetes mellitus (HCC)    Diet controlled- OFF metformin 02-01-20   E coli infection 12/10/2020   Folliculitis 12/10/2020   GERD (gastroesophageal reflux disease)    Hyperlipidemia    Hypertension    Kidney stone    Myocardial infarction [facility])    ? year- cath 2014/ june    Past Surgical History:  Procedure Laterality Date   COLONOSCOPY     CRYO INTERCOSTAL NERVE BLOCK     double  sacrum nerve block   epidural injections     multiple- L5 S1 and cervical neck pain issues as well    POLYPECTOMY     TONSILLECTOMY  1963   TRIGGER FINGER RELEASE  2019    reports that he quit smoking about 25 years ago. His smoking use included cigarettes. He started smoking about 53 years ago. He has a 7 pack-year smoking history. He has never used smokeless tobacco. He reports that he does not drink alcohol and does not use drugs. family history includes Arthritis in his father and paternal grandmother; COPD in his father; Cancer in his mother; Clotting disorder in his paternal grandfather and paternal grandmother; Colon polyps in his father; Heart disease in his maternal grandmother; Hyperlipidemia in his maternal grandmother; Hypertension in his mother; Liver cancer in his paternal grandfather; Melanoma in his mother. Allergies  Allergen Reactions   Doxycycline Hyclate Nausea And Vomiting   Latex Other (See Comments)    Other   Tape     Use Paper Tape    Sulfa Antibiotics Rash      Review of Systems  Constitutional:  Negative for fever and weight loss.  Respiratory:  Negative for shortness of breath.   Cardiovascular:  Negative for chest pain.  Gastrointestinal:  Negative for blood in stool, melena, nausea and vomiting.  Objective:     BP 90/60 (BP Location: Left Arm, Patient Position: Sitting, Cuff Size: Normal)   Pulse 65   Wt 210 lb 1.6 oz (95.3 kg)   SpO2 95%   BMI 26.98 kg/m  BP Readings from Last 3 Encounters:  06/01/23 90/60  05/27/23 110/60  05/15/23 114/60   Wt Readings from Last 3 Encounters:  06/01/23 210 lb 1.6 oz (95.3 kg)  05/27/23 209 lb 12.8 oz (95.2 kg)  05/15/23 212 lb 3.2 oz (96.3 kg)      Physical Exam Vitals reviewed.  Constitutional:      General: He is not in acute distress.    Appearance: He is not ill-appearing.  Cardiovascular:     Rate and Rhythm: Normal rate and regular rhythm.  Pulmonary:     Effort: Pulmonary effort is  normal.     Breath sounds: Normal breath sounds. No wheezing or rales.  Abdominal:     General: There is no distension.     Palpations: Abdomen is soft.     Tenderness: There is no abdominal tenderness. There is no guarding or rebound.  Neurological:     Mental Status: He is alert.      No results found for any visits on 06/01/23.  Last CBC Lab Results  Component Value Date   WBC 5.7 05/04/2023   HGB 13.2 05/04/2023   HCT 39.1 05/04/2023   MCV 91.1 05/04/2023   MCH 30.6 12/18/2021   RDW 13.5 05/04/2023   PLT 136.0 (L) 05/04/2023   Last metabolic panel Lab Results  Component Value Date   GLUCOSE 127 (H) 05/04/2023   NA 139 05/04/2023   K 4.3 05/04/2023   CL 101 05/04/2023   CO2 31 05/04/2023   BUN 23 05/04/2023   CREATININE 1.04 05/04/2023   GFR 74.06 05/04/2023   CALCIUM 8.8 05/04/2023   PROT 6.5 05/04/2023   ALBUMIN 4.3 05/04/2023   BILITOT 0.5 05/04/2023   ALKPHOS 64 05/04/2023   AST 16 05/04/2023   ALT 16 05/04/2023   ANIONGAP 7 12/18/2021      The 10-year ASCVD risk score (Arnett DK, et al., 2019) is: 17.3%    Assessment & Plan:   Patient is seen with few day history of substernal burning with eating/odynophagia.  Known history of GERD.  Weight relatively stable.  He had EGD 2/24 with no major abnormalities of the esophagus.  No known history of Barrett's esophagus.  Recommend:  -Continue Protonix 40 mg twice daily -He will continue to supplement with H2 blocker -Avoid aggravating foods such as spicy foods, high fat, citric, etc. -Avoid nonsteroidals is much as possible -Consider at least short-term trial of Carafate 1 g 4 times daily-with each meal and nightly -If symptoms not improving over the next 1 to 2 weeks be in touch and we will consider referral back to GI.   Evelena Peat, MD

## 2023-06-01 NOTE — Patient Instructions (Signed)
 Let me know if not improving over the next 1-2 weeks.

## 2023-06-03 ENCOUNTER — Ambulatory Visit (INDEPENDENT_AMBULATORY_CARE_PROVIDER_SITE_OTHER): Admitting: Family Medicine

## 2023-06-03 ENCOUNTER — Encounter: Payer: Self-pay | Admitting: Family Medicine

## 2023-06-03 VITALS — BP 96/58 | HR 70 | Temp 98.0°F | Wt 206.2 lb

## 2023-06-03 DIAGNOSIS — R1310 Dysphagia, unspecified: Secondary | ICD-10-CM | POA: Diagnosis not present

## 2023-06-03 NOTE — Progress Notes (Signed)
 Established Patient Office Visit  Subjective   Patient ID: Peter Snyder, male    DOB: Jan 06, 1956  Age: 68 y.o. MRN: [id number]  Chief Complaint  Patient presents with   Gastroesophageal Reflux    HPI   Peter Snyder has longstanding history of GERD.  He has seen GI regularly and had EGD last on 05-09-2022.  He developed rather acute substernal burning upper to mid esophageal region on Sunday.  Chronically on Protonix 40 mg twice daily and has been taking Tagamet 2-3 times daily.  We saw him 2 days ago and added Carafate 1 g 4 times daily.  No real improvement at this time.  He had been taking Aleve fairly consistently until we saw him the other day.  Still keeping down fluids and able to eat soft foods such as eggs and yogurt.  He uses no alcohol whatsoever.  No recent hematemesis or melena.  Has lost a few pounds since last visit.  No recent thrush.  Does have history of cold sores but no recent outbreak.  No recent reported fever.  Past Medical History:  Diagnosis Date   Allergy    Allergy to sulfa drugs 12/10/2020   Arthritis    CAD (coronary artery disease) 6/14   Cardiac catheterization 6/27/ 2014 ejection fraction 35-40%, 30% proximal left circumflex, 100% tiny obtuse marginal 1 with collaterals, 50% LAD, 50% D1, 100% RCA with collaterals   Chicken pox    Chronic back pain    Colon polyps    Depression    Diabetes mellitus (HCC)    Diet controlled- OFF metformin 02-01-20   E coli infection 12/10/2020   Folliculitis 12/10/2020   GERD (gastroesophageal reflux disease)    Hyperlipidemia    Hypertension    Kidney stone    Myocardial infarction [facility])    ? year- cath 2014/ june    Past Surgical History:  Procedure Laterality Date   COLONOSCOPY     CRYO INTERCOSTAL NERVE BLOCK     double sacrum nerve block   epidural injections     multiple- L5 S1 and cervical neck pain issues as well    POLYPECTOMY     TONSILLECTOMY  1963   TRIGGER FINGER RELEASE  2019    reports that he quit  smoking about 25 years ago. His smoking use included cigarettes. He started smoking about 53 years ago. He has a 7 pack-year smoking history. He has never used smokeless tobacco. He reports that he does not drink alcohol and does not use drugs. family history includes Arthritis in his father and paternal grandmother; COPD in his father; Cancer in his mother; Clotting disorder in his paternal grandfather and paternal grandmother; Colon polyps in his father; Heart disease in his maternal grandmother; Hyperlipidemia in his maternal grandmother; Hypertension in his mother; Liver cancer in his paternal grandfather; Melanoma in his mother. Allergies  Allergen Reactions   Doxycycline Hyclate Nausea And Vomiting   Latex Other (See Comments)    Other   Tape     Use Paper Tape    Sulfa Antibiotics Rash    Review of Systems  Constitutional:  Positive for weight loss. Negative for chills and fever.  HENT:  Negative for sore throat.   Respiratory:  Negative for cough and shortness of breath.   Cardiovascular:  Negative for chest pain.      Objective:     BP (!) 96/58 (BP Location: Left Arm, Patient Position: Sitting, Cuff Size: Normal)   Pulse 70  Temp 98 F (36.7 C) (Oral)   Wt 206 lb 3.2 oz (93.5 kg)   SpO2 97%   BMI 26.47 kg/m  BP Readings from Last 3 Encounters:  06/03/23 (!) 96/58  06/01/23 90/60  05/27/23 110/60   Wt Readings from Last 3 Encounters:  06/03/23 206 lb 3.2 oz (93.5 kg)  06/01/23 210 lb 1.6 oz (95.3 kg)  05/27/23 209 lb 12.8 oz (95.2 kg)      Physical Exam Vitals reviewed.  Constitutional:      General: He is not in acute distress.    Appearance: He is not ill-appearing.  HENT:     Mouth/Throat:     Mouth: Mucous membranes are moist.     Pharynx: Oropharynx is clear. No oropharyngeal exudate or posterior oropharyngeal erythema.  Cardiovascular:     Rate and Rhythm: Normal rate and regular rhythm.  Pulmonary:     Effort: Pulmonary effort is normal.      Breath sounds: Normal breath sounds.  Abdominal:     General: Bowel sounds are normal. There is no distension.     Palpations: Abdomen is soft.     Tenderness: There is no abdominal tenderness. There is no guarding or rebound.  Neurological:     Mental Status: He is alert.      No results found for any visits on 06/03/23.    The 10-year ASCVD risk score (Arnett DK, et al., 2019) is: 19.2%    Assessment & Plan:   Problem List Items Addressed This Visit   None Visit Diagnoses       Odynophagia    -  Primary   Relevant Orders   Ambulatory referral to Gastroenterology     Longstanding history of GERD on chronic PPI twice daily.  Rather acute onset of odynophagia Sunday in the setting of recent nonsteroidal use with Aleve.  Patient currently on twice daily PPI, Tagamet, and Carafate without improvement.  Has not taken any nonsteroidals past several days.  No alcohol.  Does not appear dehydrated.  Has had some modest weight loss just over the past few days but not chronically over several months  -Set up GI referral for further evaluation and will try to get into soon as possible. -Avoidance of all non-steroidals -He is focusing currently on soft foods until further evaluation  No follow-ups on file.    Evelena Peat, MD

## 2023-06-05 ENCOUNTER — Encounter: Payer: Self-pay | Admitting: Gastroenterology

## 2023-06-05 ENCOUNTER — Ambulatory Visit: Admitting: Family Medicine

## 2023-06-05 ENCOUNTER — Ambulatory Visit (INDEPENDENT_AMBULATORY_CARE_PROVIDER_SITE_OTHER): Admitting: Gastroenterology

## 2023-06-05 VITALS — BP 110/62 | HR 84 | Ht 74.0 in | Wt 207.0 lb

## 2023-06-05 DIAGNOSIS — R1310 Dysphagia, unspecified: Secondary | ICD-10-CM

## 2023-06-05 DIAGNOSIS — R0789 Other chest pain: Secondary | ICD-10-CM | POA: Diagnosis not present

## 2023-06-05 DIAGNOSIS — T50905A Adverse effect of unspecified drugs, medicaments and biological substances, initial encounter: Secondary | ICD-10-CM | POA: Insufficient documentation

## 2023-06-05 NOTE — Progress Notes (Signed)
 06/05/2023 Peter Snyder [id number] [birth year]   HISTORY OF PRESENT ILLNESS: This is a 68 year old male who is a patient Dr. Irving Burton.  Was seen here last about a year ago for issues with iron deficiency.  Takes pantoprazole 40 mg twice daily for reflux issues.  Had EGD in February 2024 as below that essentially was really unremarkable.  He says that recently he has been using a couple of Aleve every day to help with arthritic pains, etc.  On Sunday he had sudden onset of severe burning pain in his chest.  Was painful to swallow.  He has discontinued the Aleve.  He does admit that he takes medication without drinking any water.  He saw his PCP and they placed him on Carafate suspension 4 times a day.  He has also been taking liquid Gaviscon.  He admits that today currently it is feeling somewhat better, probably about 50% at this point.  EGD 04/2022:  - Normal larynx. - Normal esophagus. - A few fundic gland polyps. - Normal examined duodenum. - No specimens collected.   Past Medical History:  Diagnosis Date   Allergy    Allergy to sulfa drugs 12/10/2020   Arthritis    CAD (coronary artery disease) 6/14   Cardiac catheterization 6/27/ 2014 ejection fraction 35-40%, 30% proximal left circumflex, 100% tiny obtuse marginal 1 with collaterals, 50% LAD, 50% D1, 100% RCA with collaterals   Chicken pox    Chronic back pain    Colon polyps    Depression    Diabetes mellitus (HCC)    Diet controlled- OFF metformin 02-01-20   E coli infection 12/10/2020   Folliculitis 12/10/2020   GERD (gastroesophageal reflux disease)    Hyperlipidemia    Hypertension    Kidney stone    Myocardial infarction [facility])    ? year- cath 2014/ june    Past Surgical History:  Procedure Laterality Date   CATARACT EXTRACTION  05/2023   COLONOSCOPY     CRYO INTERCOSTAL NERVE BLOCK     double sacrum nerve block   epidural injections     multiple- L5 S1 and cervical neck pain issues as well    POLYPECTOMY      TONSILLECTOMY  1963   TRIGGER FINGER RELEASE  2019    reports that he quit smoking about 25 years ago. His smoking use included cigarettes. He started smoking about 53 years ago. He has a 7 pack-year smoking history. He has never used smokeless tobacco. He reports that he does not drink alcohol and does not use drugs. family history includes Arthritis in his father and paternal grandmother; COPD in his father; Cancer in his mother; Clotting disorder in his paternal grandfather and paternal grandmother; Colon polyps in his father; Heart disease in his maternal grandmother; Hyperlipidemia in his maternal grandmother; Hypertension in his mother; Liver cancer in his paternal grandfather; Melanoma in his mother. Allergies  Allergen Reactions   Doxycycline Hyclate Nausea And Vomiting   Latex Other (See Comments)    Other   Tape     Use Paper Tape    Sulfa Antibiotics Rash      Outpatient Encounter Medications as of 06/05/2023  Medication Sig   acetaminophen (TYLENOL) 500 MG tablet Take 500 mg by mouth every 6 (six) hours as needed for moderate pain.   ALPRAZolam (XANAX) 0.25 MG tablet Take 0.25 mg by mouth daily as needed for anxiety.   aspirin 81 MG tablet Take 81 mg by mouth daily.  atorvastatin (LIPITOR) 80 MG tablet TAKE 1 TABLET BY MOUTH EVERY DAY   Azelastine HCl 137 MCG/SPRAY SOLN PLACE 1 SPRAY INTO BOTH NOSTRILS 2 TIMES DAILY AS DIRECTED   carvedilol (COREG) 12.5 MG tablet TAKE 1 TABLET TWICE A DAY WITH A MEAL   diclofenac Sodium (VOLTAREN) 1 % GEL Apply 2 g topically daily as needed (for pain).   escitalopram (LEXAPRO) 20 MG tablet Take 20 mg by mouth at bedtime.   ezetimibe (ZETIA) 10 MG tablet TAKE 1 TABLET BY MOUTH EVERY DAY   gabapentin (NEURONTIN) 600 MG tablet Take 1 tablet (600 mg total) by mouth 2 (two) times daily.   LOTEMAX 0.5 % GEL SMARTSIG:1 Drop(s) In Eye(s) Twice Daily PRN   pantoprazole (PROTONIX) 40 MG tablet TAKE 1 TABLET BY MOUTH TWICE A DAY   pimecrolimus (ELIDEL)  1 % cream 1 Application as needed.   RESTASIS 0.05 % ophthalmic emulsion Place 1 drop into both eyes 2 (two) times daily.    sacubitril-valsartan (ENTRESTO) 49-51 MG TAKE 1 TABLET BY MOUTH TWICE A DAY   sucralfate (CARAFATE) 1 g tablet Take 1 tablet (1 g total) by mouth 4 (four) times daily -  with meals and at bedtime.   tamsulosin (FLOMAX) 0.4 MG CAPS capsule TAKE 1 CAPSULE EVERY DAY AFTER SUPPER   triamcinolone cream (KENALOG) 0.1 % APPLY TOPICALLY TO THE AFFECTED AREA 2 (TWO) TIMES DAILY AS NEEDED.   ULTRAM 50 MG tablet Take 50 mg by mouth 3 (three) times daily as needed.   zolpidem (AMBIEN) 10 MG tablet Take 10 mg by mouth at bedtime as needed for sleep.   No facility-administered encounter medications on file as of 06/05/2023.     REVIEW OF SYSTEMS  : All other systems reviewed and negative except where noted in the History of Present Illness.   PHYSICAL EXAM: BP 110/62   Pulse 84   Ht 6\' 2"  (1.88 m)   Wt 207 lb (93.9 kg)   BMI 26.58 kg/m  General: Well developed white male in no acute distress Head: Normocephalic and atraumatic Eyes:  Sclerae anicteric, conjunctiva pink. Ears: Normal auditory acuity Lungs: Clear throughout to auscultation; no W/R/R. Heart: Regular rate and rhythm; no M/R/G. Abdomen: Soft, non-distended.  BS present.  Non-tender. Musculoskeletal: Symmetrical with no gross deformities  Skin: No lesions on visible extremities Neurological: Alert oriented x 4, grossly non-focal Psychological:  Alert and cooperative. Normal mood and affect  ASSESSMENT AND PLAN: *68 year old male with sudden onset atypical chest pain/odynophagia 5 days ago.  Has been maintained on pantoprazole 40 mg twice daily, but these acute symptoms have improved with the addition of Carafate suspension form 4 times a day and liquid Gaviscon for the last few days.  He was taking a couple Aleve every day and admits that he would take them and not drink any water.  I bet that he has pill induced  esophagitis.  This will take some time to heal.  Will continue his pantoprazole 40 mg twice daily.  Will continue the Carafate 4 times a day for a couple of weeks and then can go down to twice a day until the prescription runs out.  He will contact us and let us know if symptoms worsen or do not continue to improve.  Otherwise he will avoid NSAIDs for the next few weeks and then should be sure that he drinks a full glass of water with some and remains upright.  Advised on soft diet for the next several days.  CC:  Kristian Covey, MD

## 2023-06-05 NOTE — Patient Instructions (Signed)
 Please send a MyChart message if symptoms worsen or don't continue to improve.   Continue Carafate 4 times daily for 2 weeks then decrease to twice daily until prescription runs out.  _______________________________________________________  If your blood pressure at your visit was 140/90 or greater, please contact your primary care physician to follow up on this.  _______________________________________________________  If you are age 68 or older, your body mass index should be between 23-30. Your Body mass index is 26.58 kg/m. If this is out of the aforementioned range listed, please consider follow up with your Primary Care Provider.  If you are age 39 or younger, your body mass index should be between 19-25. Your Body mass index is 26.58 kg/m. If this is out of the aformentioned range listed, please consider follow up with your Primary Care Provider.   ________________________________________________________  The [facility] providers would like to encourage you to use [facility] to communicate with providers for non-urgent requests or questions.  Due to long hold times on the telephone, sending your provider a message by [facility] may be a faster and more efficient way to get a response.  Please allow 48 business hours for a response.  Please remember that this is for non-urgent requests.  _______________________________________________________

## 2023-06-08 NOTE — Progress Notes (Signed)
 ____________________________________________________________  Attending physician addendum:  Thank you for sending this case to me. I have reviewed the entire note and agree with the plan.   Amada Jupiter, MD  ____________________________________________________________

## 2023-06-09 ENCOUNTER — Ambulatory Visit (INDEPENDENT_AMBULATORY_CARE_PROVIDER_SITE_OTHER): Admitting: Family Medicine

## 2023-06-09 ENCOUNTER — Encounter: Payer: Self-pay | Admitting: Family Medicine

## 2023-06-09 VITALS — BP 100/68 | HR 61 | Temp 98.0°F | Ht 74.0 in | Wt 208.4 lb

## 2023-06-09 DIAGNOSIS — L089 Local infection of the skin and subcutaneous tissue, unspecified: Secondary | ICD-10-CM

## 2023-06-09 NOTE — Progress Notes (Signed)
 Established Patient Office Visit  Subjective   Patient ID: Peter Snyder, male    DOB: 1955-06-15  Age: 68 y.o. MRN: [id number]  Chief Complaint  Patient presents with   Recurrent Skin Infections    Right butt boil started 2 days ago, patient denies any drainage     HPI   Seen with small boil of right buttock.  Hx of similar boils in the past.  First noted couple of days ago.  No drainage.  No fever.   Minimal pain.   Recent odynophagia symptoms slowly improving.  Just saw GI.  Continuing Carafate. No further work up recommended at this time.  Past Medical History:  Diagnosis Date   Allergy    Allergy to sulfa drugs 12/10/2020   Anxiety 2/98   Arthritis    CAD (coronary artery disease) 08/2012   Cardiac catheterization 6/27/ 2014 ejection fraction 35-40%, 30% proximal left circumflex, 100% tiny obtuse marginal 1 with collaterals, 50% LAD, 50% D1, 100% RCA with collaterals   Chicken pox    Chronic back pain    Colon polyps    Depression    Diabetes mellitus (HCC)    Diet controlled- OFF metformin 02-01-20   E coli infection 12/10/2020   Folliculitis 12/10/2020   GERD (gastroesophageal reflux disease)    Hyperlipidemia    Hypertension    Kidney stone    Myocardial infarction [facility])    ? year- cath 2014/ june    Past Surgical History:  Procedure Laterality Date   CATARACT EXTRACTION  05/2023   COLONOSCOPY     CRYO INTERCOSTAL NERVE BLOCK     double sacrum nerve block   epidural injections     multiple- L5 S1 and cervical neck pain issues as well    POLYPECTOMY     TONSILLECTOMY  1963   TRIGGER FINGER RELEASE  2019    reports that he quit smoking about 25 years ago. His smoking use included cigarettes. He started smoking about 53 years ago. He has a 7 pack-year smoking history. He has never used smokeless tobacco. He reports that he does not drink alcohol and does not use drugs. family history includes ADD / ADHD in his brother; Alcohol abuse in his son; Arthritis in  his father and paternal grandmother; COPD in his father; Cancer in his mother; Clotting disorder in his paternal grandfather and paternal grandmother; Colon polyps in his father; Early death in his brother; Heart disease in his maternal grandmother; Hyperlipidemia in his maternal grandmother; Hypertension in his mother; Liver cancer in his paternal grandfather; Melanoma in his mother. Allergies  Allergen Reactions   Doxycycline Hyclate Nausea And Vomiting   Latex Other (See Comments)    Other   Tape     Use Paper Tape    Sulfa Antibiotics Rash    Review of Systems  Constitutional:  Negative for chills and fever.  Cardiovascular:  Negative for chest pain.      Objective:     BP 100/68 (BP Location: Left Arm, Patient Position: Sitting, Cuff Size: Normal)   Pulse 61   Temp 98 F (36.7 C) (Oral)   Ht 6\' 2"  (1.88 m)   Wt 208 lb 6.4 oz (94.5 kg)   SpO2 95%   BMI 26.76 kg/m  BP Readings from Last 3 Encounters:  06/09/23 100/68  06/05/23 110/62  06/03/23 (!) 96/58   Wt Readings from Last 3 Encounters:  06/09/23 208 lb 6.4 oz (94.5 kg)  06/05/23 207 lb (93.9 kg)  06/03/23 206 lb 3.2 oz (93.5 kg)      Physical Exam Vitals reviewed.  Constitutional:      General: He is not in acute distress.    Appearance: He is not ill-appearing.  Skin:    Comments: Small pustule right buttock.  Pinpoint white center and approximately 1/2 CM area of surrounding erythema.  No fluctuance.    Neurological:     Mental Status: He is alert.      No results found for any visits on 06/09/23.    The 10-year ASCVD risk score (Arnett DK, et al., 2019) is: 20.5%    Assessment & Plan:   Small skin pustule right buttock.   No indication for I and D.  Recommend warm tub soaks.  Continue good antibacterial soap daily.  Would try to avoid antibiotics orally at this time unless any cellulitis changes  noted- esp in setting of recent worsening esophagitis changes.    Evelena Peat, MD

## 2023-06-17 ENCOUNTER — Ambulatory Visit (INDEPENDENT_AMBULATORY_CARE_PROVIDER_SITE_OTHER): Payer: BLUE CROSS/BLUE SHIELD

## 2023-06-17 VITALS — BP 120/60 | HR 63 | Temp 98.6°F | Ht 74.0 in | Wt 207.6 lb

## 2023-06-17 DIAGNOSIS — Z0000 Encounter for general adult medical examination without abnormal findings: Secondary | ICD-10-CM | POA: Diagnosis not present

## 2023-06-17 NOTE — Patient Instructions (Addendum)
 [name] , Thank you for taking time to come for your Medicare Wellness Visit. I appreciate your ongoing commitment to your health goals. Please review the following plan we discussed and let me know if I can assist you in the future.   Referrals/Orders/Follow-Ups/Clinician Recommendations:   This is a list of the screening recommended for you and due dates:  Health Maintenance  Topic Date Due   Complete foot exam   03/30/2021   COVID-19 Vaccine (7 - Pfizer risk 2024-25 season) 06/23/2023   Flu Shot  10/16/2023   Hemoglobin A1C  11/01/2023   Eye exam for diabetics  03/31/2024   Yearly kidney function blood test for diabetes  05/03/2024   Yearly kidney health urinalysis for diabetes  05/03/2024   Medicare Annual Wellness Visit  06/16/2024   Colon Cancer Screening  05/07/2025   DTaP/Tdap/Td vaccine (4 - Td or Tdap) 10/27/2032   Pneumonia Vaccine  Completed   Hepatitis C Screening  Completed   Zoster (Shingles) Vaccine  Completed   HPV Vaccine  Aged Out    Advanced directives: (Copy Requested) Please bring a copy of your health care power of attorney and living will to the office to be added to your chart at your convenience. You can mail to [facility] [address] [place], [place] [address] or email to [email or web address]  Next Medicare Annual Wellness Visit scheduled for next year: Yes

## 2023-06-17 NOTE — Progress Notes (Signed)
 Subjective:   Peter Snyder is a 68 y.o. who presents for a Medicare Wellness preventive visit.  Visit Complete: In person    Persons Participating in Visit: Patient.  AWV Questionnaire: Yes: Patient Medicare AWV questionnaire was completed by the patient on 06/13/23; I have confirmed that all information answered by patient is correct and no changes since this date.  Cardiac Risk Factors include: advanced age (>28men, >60 women);male gender;diabetes mellitus;hypertension     Objective:    Today's Vitals   06/17/23 1012  BP: 120/60  Pulse: 63  Temp: 98.6 F (37 C)  TempSrc: Oral  SpO2: 98%  Weight: 207 lb 9.6 oz (94.2 kg)  Height: 6\' 2"  (1.88 m)   Body mass index is 26.65 kg/m.     06/17/2023   10:20 AM 10/27/2022   11:13 PM 06/11/2022   11:06 AM 04/01/2022    3:47 PM 12/18/2021    4:53 PM 06/03/2021   11:04 AM 05/07/2021    5:04 AM  Advanced Directives  Does Patient Have a Medical Advance Directive? Yes No Yes No No Yes No  Type of Estate agent of [place];Living will  Healthcare Power of [place];Living will   Healthcare Power of Attorney   Does patient want to make changes to medical advance directive?      No - Patient declined   Copy of Healthcare Power of Attorney in Chart? No - copy requested  No - copy requested   No - copy requested   Would patient like information on creating a medical advance directive?    No - Patient declined   No - Patient declined    Current Medications (verified) Outpatient Encounter Medications as of 06/17/2023  Medication Sig   acetaminophen (TYLENOL) 500 MG tablet Take 500 mg by mouth every 6 (six) hours as needed for moderate pain.   ALPRAZolam (XANAX) 0.25 MG tablet Take 0.25 mg by mouth daily as needed for anxiety.   aspirin 81 MG tablet Take 81 mg by mouth daily.   atorvastatin (LIPITOR) 80 MG tablet TAKE 1 TABLET BY MOUTH EVERY DAY   Azelastine HCl 137 MCG/SPRAY SOLN PLACE 1 SPRAY INTO BOTH NOSTRILS 2 TIMES  DAILY AS DIRECTED   carvedilol (COREG) 12.5 MG tablet TAKE 1 TABLET TWICE A DAY WITH A MEAL   diclofenac Sodium (VOLTAREN) 1 % GEL Apply 2 g topically daily as needed (for pain).   escitalopram (LEXAPRO) 20 MG tablet Take 20 mg by mouth at bedtime.   ezetimibe (ZETIA) 10 MG tablet TAKE 1 TABLET BY MOUTH EVERY DAY   gabapentin (NEURONTIN) 600 MG tablet Take 1 tablet (600 mg total) by mouth 2 (two) times daily.   LOTEMAX 0.5 % GEL SMARTSIG:1 Drop(s) In Eye(s) Twice Daily PRN   pantoprazole (PROTONIX) 40 MG tablet TAKE 1 TABLET BY MOUTH TWICE A DAY   pimecrolimus (ELIDEL) 1 % cream 1 Application as needed.   RESTASIS 0.05 % ophthalmic emulsion Place 1 drop into both eyes 2 (two) times daily.    sacubitril-valsartan (ENTRESTO) 49-51 MG TAKE 1 TABLET BY MOUTH TWICE A DAY   sucralfate (CARAFATE) 1 g tablet Take 1 tablet (1 g total) by mouth 4 (four) times daily -  with meals and at bedtime.   tamsulosin (FLOMAX) 0.4 MG CAPS capsule TAKE 1 CAPSULE EVERY DAY AFTER SUPPER   triamcinolone cream (KENALOG) 0.1 % APPLY TOPICALLY TO THE AFFECTED AREA 2 (TWO) TIMES DAILY AS NEEDED.   ULTRAM 50 MG tablet Take 50 mg  by mouth 3 (three) times daily as needed.   zolpidem (AMBIEN) 10 MG tablet Take 10 mg by mouth at bedtime as needed for sleep.   No facility-administered encounter medications on file as of 06/17/2023.    Allergies (verified) Doxycycline hyclate, Latex, Tape, and Sulfa antibiotics   History: Past Medical History:  Diagnosis Date   Allergy    Allergy to sulfa drugs 12/10/2020   Anxiety 2/98   Arthritis    CAD (coronary artery disease) 08/2012   Cardiac catheterization 6/27/ 2014 ejection fraction 35-40%, 30% proximal left circumflex, 100% tiny obtuse marginal 1 with collaterals, 50% LAD, 50% D1, 100% RCA with collaterals   Chicken pox    Chronic back pain    Colon polyps    Depression    Diabetes mellitus (HCC)    Diet controlled- OFF metformin 02-01-20   E coli infection 12/10/2020    Folliculitis 12/10/2020   GERD (gastroesophageal reflux disease)    Hyperlipidemia    Hypertension    Kidney stone    Myocardial infarction [facility])    ? year- cath 2014/ june    Past Surgical History:  Procedure Laterality Date   CATARACT EXTRACTION  05/2023   COLONOSCOPY     CRYO INTERCOSTAL NERVE BLOCK     double sacrum nerve block   epidural injections     multiple- L5 S1 and cervical neck pain issues as well    POLYPECTOMY     TONSILLECTOMY  1963   TRIGGER FINGER RELEASE  2019   Family History  Problem Relation Age of Onset   Melanoma Mother        metastatic   Cancer Mother        melanoma   Hypertension Mother    Arthritis Father    Colon polyps Father    COPD Father    Hyperlipidemia Maternal Grandmother    Heart disease Maternal Grandmother    Arthritis Paternal Grandmother    Clotting disorder Paternal Grandmother    Clotting disorder Paternal Grandfather    Liver cancer Paternal Grandfather    ADD / ADHD Brother    Early death Brother    Alcohol abuse Son    Colon cancer Neg Hx    Esophageal cancer Neg Hx    Rectal cancer Neg Hx    Stomach cancer Neg Hx    Social History   Socioeconomic History   Marital status: Married    Spouse name: Not on file   Number of children: 3   Years of education: Not on file   Highest education level: Bachelor's degree (e.g., BA, AB, BS)  Occupational History   Occupation: retired    Comment: Retired  Tobacco Use   Smoking status: Former    Current packs/day: 0.00    Average packs/day: 0.3 packs/day for 28.0 years (7.0 ttl pk-yrs)    Types: Cigarettes    Start date: 22    Quit date: 2000    Years since quitting: 25.2   Smokeless tobacco: Never  Vaping Use   Vaping status: Never Used  Substance and Sexual Activity   Alcohol use: No    Alcohol/week: 0.0 standard drinks of alcohol   Drug use: No   Sexual activity: Not on file  Other Topics Concern   Not on file  Social History Narrative   Not on file    Social Drivers of Health   Financial Resource Strain: Low Risk  (06/17/2023)   Overall Financial Resource Strain (CARDIA)  Difficulty of Paying Living Expenses: Not hard at all  Food Insecurity: No Food Insecurity (06/17/2023)   Hunger Vital Sign    Worried About Running Out of Food in the Last Year: Never true    Ran Out of Food in the Last Year: Never true  Transportation Needs: No Transportation Needs (06/17/2023)   PRAPARE - Administrator, Civil Service (Medical): No    Lack of Transportation (Non-Medical): No  Physical Activity: Sufficiently Active (06/17/2023)   Exercise Vital Sign    Days of Exercise per Week: 6 days    Minutes of Exercise per Session: 60 min  Stress: No Stress Concern Present (06/17/2023)   [employer] of Occupational Health - Occupational Stress Questionnaire    Feeling of Stress : Not at all  Social Connections: Socially Integrated (06/17/2023)   Social Connection and Isolation Panel [NHANES]    Frequency of Communication with Friends and Family: More than three times a week    Frequency of Social Gatherings with Friends and Family: More than three times a week    Attends Religious Services: More than 4 times per year    Active Member of [employer] or Organizations: Yes    Attends Engineer, structural: More than 4 times per year    Marital Status: Married    Tobacco Counseling Counseling given: Not Answered    Clinical Intake:  Pre-visit preparation completed: Yes  Pain : No/denies pain     BMI - recorded: 26.65 Nutritional Status: BMI 25 -29 Overweight Nutritional Risks: None Diabetes: Yes CBG done?: No Did pt. bring in CBG monitor from home?: No  Lab Results  Component Value Date   HGBA1C 6.3 (A) 05/04/2023   HGBA1C 6.1 (A) 11/11/2022   HGBA1C 6.8 (H) 05/02/2022     How often do you need to have someone help you when you read instructions, pamphlets, or other written materials from your doctor or pharmacy?: 1 -  Never  Interpreter Needed?: No  Information entered by :: Theresa Mulligan LPN   Activities of Daily Living     06/17/2023   10:19 AM 06/13/2023   10:46 AM  In your present state of health, do you have any difficulty performing the following activities:  Hearing? 0 0  Vision? 0 0  Difficulty concentrating or making decisions? 0 0  Walking or climbing stairs? 0 0  Dressing or bathing? 0 0  Doing errands, shopping? 0 0  Preparing Food and eating ? N N  Using the Toilet? N N  In the past six months, have you accidently leaked urine? N N  Do you have problems with loss of bowel control? N N  Managing your Medications? N N  Managing your Finances? N N  Housekeeping or managing your Housekeeping? N N    Patient Care Team: Kristian Covey, MD as PCP - General (Family Medicine)  Indicate any recent Medical Services you may have received from other than [facility] providers in the past year (date may be approximate).     Assessment:   This is a routine wellness examination for [place].  Hearing/Vision screen Hearing Screening - Comments:: Denies hearing difficulties   Vision Screening - Comments::  - up to date with routine eye exams with  Dr Dione Booze   Goals Addressed               This Visit's Progress     Remain active (pt-stated)         Depression Screen  06/17/2023   10:18 AM 09/04/2022    8:29 AM 08/04/2022   11:33 AM 06/11/2022   11:03 AM 05/02/2022    7:59 AM 12/30/2021    8:57 AM 12/27/2021    9:21 AM  PHQ 2/9 Scores  PHQ - 2 Score 0 0 0 0 0 0 0  PHQ- 9 Score  0 0   0     Fall Risk      06/17/2023   10:19 AM 06/13/2023   10:46 AM 02/22/2023    1:45 PM 09/04/2022    8:28 AM 08/04/2022   11:33 AM  Fall Risk   Falls in the past year? 0 1 0 0 0  Number falls in past yr: 0 0  0 0  Injury with Fall? 0 0  0 0  Risk for fall due to : No Fall Risks   No Fall Risks   Follow up Falls prevention discussed;Falls evaluation completed   Falls evaluation completed Falls  evaluation completed    MEDICARE RISK AT HOME:   Medicare Risk at Home Any stairs in or around the home?: Yes If so, are there any without handrails?: No Home free of loose throw rugs in walkways, pet beds, electrical cords, etc?: Yes Adequate lighting in your home to reduce risk of falls?: Yes Life alert?: No Use of a cane, walker or w/c?: No Grab bars in the bathroom?: No Shower chair or bench in shower?: No Elevated toilet seat or a handicapped toilet?: No  TIMED UP AND GO:  Was the test performed?  No  Cognitive Function: 6CIT completed        06/17/2023   10:20 AM 06/11/2022   11:06 AM 06/03/2021   11:04 AM  6CIT Screen  What Year? 0 points 0 points 0 points  What month? 0 points 0 points 0 points  What time? 0 points 0 points   Count back from 20 0 points 0 points   Months in reverse 0 points 0 points   Repeat phrase 0 points 0 points   Total Score 0 points 0 points     Immunizations Immunization History  Administered Date(s) Administered   Fluad Quad(high Dose 65+) 11/15/2021   Fluad Trivalent(High Dose 65+) 12/02/2022   Hepatitis A, Adult 06/22/2014   Influenza Whole 10/15/2012   Influenza,inj,Quad PF,6+ Mos 12/02/2013, 12/08/2014, 11/28/2015, 11/24/2016, 11/17/2017, 10/20/2018, 11/17/2019   Influenza,inj,quad, With Preservative 01/19/2017, 10/20/2018   Influenza-Unspecified 11/25/2020   PFIZER Comirnaty(Gray Top)Covid-19 Tri-Sucrose Vaccine 06/21/2020   PFIZER(Purple Top)SARS-COV-2 Vaccination 04/22/2019, 05/17/2019, 12/14/2019   Pfizer Covid-19 Vaccine Bivalent Booster 75yrs & up 12/10/2020   Pfizer(Comirnaty)Fall Seasonal Vaccine 12 years and older 12/23/2022   Pneumococcal Conjugate-13 03/14/2014   Pneumococcal Polysaccharide-23 12/29/2012, 05/25/2020   Respiratory Syncytial Virus Vaccine,Recomb Aduvanted(Arexvy) 03/09/2022   Td 03/17/2004   Tdap 03/14/2014, 10/28/2022   Zoster Recombinant(Shingrix) 03/06/2017, 03/08/2018   Zoster, Live 10/16/2011     Screening Tests Health Maintenance  Topic Date Due   FOOT EXAM  03/30/2021   COVID-19 Vaccine (7 - Pfizer risk 2024-25 season) 06/23/2023   INFLUENZA VACCINE  10/16/2023   HEMOGLOBIN A1C  11/01/2023   OPHTHALMOLOGY EXAM  03/31/2024   Diabetic kidney evaluation - eGFR measurement  05/03/2024   Diabetic kidney evaluation - Urine ACR  05/03/2024   Medicare Annual Wellness (AWV)  06/16/2024   Colonoscopy  05/07/2025   DTaP/Tdap/Td (4 - Td or Tdap) 10/27/2032   Pneumonia Vaccine 22+ Years old  Completed   Hepatitis C Screening  Completed  Zoster Vaccines- Shingrix  Completed   HPV VACCINES  Aged Out    Health Maintenance  Health Maintenance Due  Topic Date Due   FOOT EXAM  03/30/2021   Health Maintenance Items Addressed:    Additional Screening:  Vision Screening: Recommended annual ophthalmology exams for early detection of glaucoma and other disorders of the eye.  Dental Screening: Recommended annual dental exams for proper oral hygiene  Community Resource Referral / Chronic Care Management: CRR required this visit?  No   CCM required this visit?  No     Plan:     I have personally reviewed and noted the following in the patient's chart:   Medical and social history Use of alcohol, tobacco or illicit drugs  Current medications and supplements including opioid prescriptions. Patient is not currently taking opioid prescriptions. Functional ability and status Nutritional status Physical activity Advanced directives List of other physicians Hospitalizations, surgeries, and ER visits in previous 12 months Vitals Screenings to include cognitive, depression, and falls Referrals and appointments  In addition, I have reviewed and discussed with patient certain preventive protocols, quality metrics, and best practice recommendations. A written personalized care plan for preventive services as well as general preventive health recommendations were provided to  patient.     Tillie Rung, LPN   03/22/1094   After Visit Summary: (In Person-Printed) AVS printed and given to the patient  Notes: Nothing significant to report at this time.

## 2023-06-26 ENCOUNTER — Ambulatory Visit (INDEPENDENT_AMBULATORY_CARE_PROVIDER_SITE_OTHER): Admitting: Family Medicine

## 2023-06-26 ENCOUNTER — Encounter: Payer: Self-pay | Admitting: Family Medicine

## 2023-06-26 VITALS — BP 118/62 | HR 57 | Temp 97.5°F | Wt 207.5 lb

## 2023-06-26 DIAGNOSIS — K649 Unspecified hemorrhoids: Secondary | ICD-10-CM

## 2023-06-26 DIAGNOSIS — K5900 Constipation, unspecified: Secondary | ICD-10-CM | POA: Diagnosis not present

## 2023-06-26 NOTE — Progress Notes (Signed)
 Established Patient Office Visit  Subjective   Patient ID: Peter Snyder, male    DOB: 02-02-1956  Age: 68 y.o. MRN: [id number]  Chief Complaint  Patient presents with   Hemorrhoids    Patient complains of hemorrhoids, x1 week     HPI   Cord is seen for follow-up with concerns of some external hemorrhoid problems for past week or so.  He had recent esophagitis symptoms and started Carafate which he thinks is helping.  Symptoms have almost resolved and he plans to scale back and taper off Carafate soon.  Has already seen GI regarding this.  He is also on chronic PPI.  No dysphagia.  Has had some recent straining.  No bloody stools but has had some perianal discomfort intermittently.  Longstanding history of hemorrhoids.  Has used topical preparations previously.  Last colonoscopy 2/22  Past Medical History:  Diagnosis Date   Allergy    Allergy to sulfa drugs 12/10/2020   Anxiety 2/98   Arthritis    CAD (coronary artery disease) 08/2012   Cardiac catheterization 6/27/ 2014 ejection fraction 35-40%, 30% proximal left circumflex, 100% tiny obtuse marginal 1 with collaterals, 50% LAD, 50% D1, 100% RCA with collaterals   Chicken pox    Chronic back pain    Colon polyps    Depression    Diabetes mellitus (HCC)    Diet controlled- OFF metformin 02-01-20   E coli infection 12/10/2020   Folliculitis 12/10/2020   GERD (gastroesophageal reflux disease)    Hyperlipidemia    Hypertension    Kidney stone    Myocardial infarction [facility])    ? year- cath 2014/ june    Past Surgical History:  Procedure Laterality Date   CATARACT EXTRACTION  05/2023   COLONOSCOPY     CRYO INTERCOSTAL NERVE BLOCK     double sacrum nerve block   epidural injections     multiple- L5 S1 and cervical neck pain issues as well    POLYPECTOMY     TONSILLECTOMY  1963   TRIGGER FINGER RELEASE  2019    reports that he quit smoking about 25 years ago. His smoking use included cigarettes. He started smoking  about 53 years ago. He has a 7 pack-year smoking history. He has never used smokeless tobacco. He reports that he does not drink alcohol and does not use drugs. family history includes ADD / ADHD in his brother; Alcohol abuse in his son; Arthritis in his father and paternal grandmother; COPD in his father; Cancer in his mother; Clotting disorder in his paternal grandfather and paternal grandmother; Colon polyps in his father; Early death in his brother; Heart disease in his maternal grandmother; Hyperlipidemia in his maternal grandmother; Hypertension in his mother; Liver cancer in his paternal grandfather; Melanoma in his mother. Allergies  Allergen Reactions   Doxycycline Hyclate Nausea And Vomiting   Latex Other (See Comments)    Other   Tape     Use Paper Tape    Sulfa Antibiotics Rash    Review of Systems  Constitutional:  Negative for chills and fever.  Cardiovascular:  Negative for chest pain.  Gastrointestinal:  Positive for constipation. Negative for abdominal pain, blood in stool, diarrhea, melena, nausea and vomiting.      Objective:     BP 118/62 (BP Location: Left Arm, Patient Position: Sitting, Cuff Size: Normal)   Pulse (!) 57   Temp (!) 97.5 F (36.4 C) (Oral)   Wt 207 lb 8 oz (94.1 kg)  SpO2 97%   BMI 26.64 kg/m  BP Readings from Last 3 Encounters:  06/26/23 118/62  06/17/23 120/60  06/09/23 100/68   Wt Readings from Last 3 Encounters:  06/26/23 207 lb 8 oz (94.1 kg)  06/17/23 207 lb 9.6 oz (94.2 kg)  06/09/23 208 lb 6.4 oz (94.5 kg)      Physical Exam Vitals reviewed.  Constitutional:      General: He is not in acute distress.    Appearance: He is not ill-appearing.  Cardiovascular:     Rate and Rhythm: Normal rate and regular rhythm.  Pulmonary:     Effort: Pulmonary effort is normal.     Breath sounds: Normal breath sounds.  Genitourinary:    Comments: Exam reveals some hypopigmentation around the anal region.  He has some very small external  hemorrhoids.  No thrombosis.  No active bleeding.  No visible fissure.     No results found for any visits on 06/26/23.    The 10-year ASCVD risk score (Arnett DK, et al., 2019) is: 26.6%    Assessment & Plan:   Hemorrhoids.  Possibly exacerbated by recent straining with constipation. Go back on stool softener short-term.  Use MiraLAX if needed for constipation symptoms.  Increase fiber.  Continue over-the-counter topicals as needed.  Tapering off Carafate should help with his constipation hopefully   Evelena Peat, MD

## 2023-07-02 ENCOUNTER — Telehealth

## 2023-07-02 ENCOUNTER — Ambulatory Visit: Payer: Self-pay | Admitting: Family Medicine

## 2023-07-02 NOTE — Telephone Encounter (Signed)
 Chief Complaint: Hemorrhoids bleeding- started back yesterday  Symptoms: Bleeding when wipes- mild bleeding Frequency: Intermittent Pertinent Negatives: Patient denies dizziness, abdominal pain  Disposition: [x] Urgent Care (no appt availability in office)   Additional Notes: Pt was seen in office on 4/11 by Dr. Darren Em for bleeding hemorrhoids. Pt states the bleeding started again yesterday. Pt scheduled for a virtual urgent care visit today as no availability at PCP office tomorrow. This RN educated pt on home new-worsening symptoms and when to call back/seek emergent care. Pt verbalized understanding and agrees to plan.      Copied from CRM [id number]. Topic: Clinical - Red Word Triage >> Jul 02, 2023  4:38 PM Peter Snyder wrote: Red Word that prompted transfer to Nurse Triage: Bleeding fro hemroids Reason for Disposition  MODERATE rectal bleeding (small blood clots, passing blood without stool, or toilet water turns red)  Answer Assessment - Initial Assessment Questions Chief Complaint: Hemorrhoids bleeding- started back yesterday  Symptoms: Bleeding when wipes- mild bleeding  Frequency: Intermittent  Pertinent Negatives: Patient denies dizziness, abdominal pain  Protocols used: Rectal Bleeding-A-AH

## 2023-07-07 ENCOUNTER — Ambulatory Visit: Admitting: Family Medicine

## 2023-07-08 ENCOUNTER — Ambulatory Visit (INDEPENDENT_AMBULATORY_CARE_PROVIDER_SITE_OTHER): Payer: Self-pay | Admitting: Podiatry

## 2023-07-08 DIAGNOSIS — M7752 Other enthesopathy of left foot: Secondary | ICD-10-CM

## 2023-07-08 DIAGNOSIS — B351 Tinea unguium: Secondary | ICD-10-CM | POA: Diagnosis not present

## 2023-07-08 DIAGNOSIS — M79674 Pain in right toe(s): Secondary | ICD-10-CM

## 2023-07-08 MED ORDER — BETAMETHASONE SOD PHOS & ACET 6 (3-3) MG/ML IJ SUSP
3.0000 mg | Freq: Once | INTRAMUSCULAR | Status: AC
  Administered 2023-07-08: 3 mg via INTRA_ARTICULAR

## 2023-07-08 NOTE — Progress Notes (Signed)
 Chief Complaint  Patient presents with   Nail Problem    Patient is here for a split toenail    HPI: 68 y.o. male presenting today as a new patient for evaluation of sensitive toenail to the right hallux nail plate.  About 4-5 months ago he had a partial nail matricectomy performed and recently he has developed some tenderness to the nail plate.  Patient also has an established history of arthritis with DJD to the fourth MTP of the left foot.  Injections in the past have helped significantly.  He presents for further treatment evaluation  Past Medical History:  Diagnosis Date   Allergy    Allergy to sulfa drugs 12/10/2020   Anxiety 2/98   Arthritis    CAD (coronary artery disease) 08/2012   Cardiac catheterization 6/27/ 2014 ejection fraction 35-40%, 30% proximal left circumflex, 100% tiny obtuse marginal 1 with collaterals, 50% LAD, 50% D1, 100% RCA with collaterals   Chicken pox    Chronic back pain    Colon polyps    Depression    Diabetes mellitus (HCC)    Diet controlled- OFF metformin  02-01-20   E coli infection 12/10/2020   Folliculitis 12/10/2020   GERD (gastroesophageal reflux disease)    Hyperlipidemia    Hypertension    Kidney stone    Myocardial infarction [facility])    ? year- cath 2014/ june     Past Surgical History:  Procedure Laterality Date   CATARACT EXTRACTION  05/2023   COLONOSCOPY     CRYO INTERCOSTAL NERVE BLOCK     double sacrum nerve block   epidural injections     multiple- L5 S1 and cervical neck pain issues as well    POLYPECTOMY     TONSILLECTOMY  1963   TRIGGER FINGER RELEASE  2019    Allergies  Allergen Reactions   Doxycycline  Hyclate Nausea And Vomiting   Latex Other (See Comments)    Other   Tape     Use Paper Tape    Sulfa Antibiotics Rash     Physical Exam: General: The patient is alert and oriented x3 in no acute distress.  Dermatology: Skin is warm, dry and supple bilateral lower extremities.  Hyperkeratotic nail plate  noted to the medial border of the right hallux which is intruding into the medial nail fold and causing sensitivity  Vascular: Palpable pedal pulses bilaterally. Capillary refill within normal limits.  No appreciable edema.  No erythema.  Neurological: Grossly intact via light touch  Musculoskeletal Exam: Hammertoe deformity noted to the lesser digits of the left foot.  There is some tenderness with palpation with limited range of motion of the fourth MTP of the left foot  DG Foot Complete Left 04/01/2022 IMPRESSION: 1. Significant MTP joint degenerative changes with possible changes of psoriatic arthritis. 2. No findings for osteomyelitis involving the fifth toe.  Assessment/Plan of Care: 1.  Pain right hallux nail plate 2.  Fourth MTP capsulitis/arthritis left  -Patient evaluated -Mechanical debridement of the offending border of the nail plate was performed today using a nail nipper and smoothed with a rotary bur.  Patient felt relief -Injection of 0.5 cc Celestone  Soluspan injected in the fourth MTP left foot -Continue wearing good supportive tennis shoes and sneakers -Return to clinic as needed       Dot Gazella, DPM [facility]  Dr. Dot Gazella, DPM    [address]  [place], [place] [address]                Office [phone]  Fax [phone]

## 2023-07-10 ENCOUNTER — Ambulatory Visit (INDEPENDENT_AMBULATORY_CARE_PROVIDER_SITE_OTHER): Admitting: Family Medicine

## 2023-07-10 ENCOUNTER — Encounter: Payer: Self-pay | Admitting: Family Medicine

## 2023-07-10 VITALS — BP 130/60 | HR 59 | Temp 98.2°F | Wt 207.7 lb

## 2023-07-10 DIAGNOSIS — K6289 Other specified diseases of anus and rectum: Secondary | ICD-10-CM | POA: Diagnosis not present

## 2023-07-10 NOTE — Patient Instructions (Signed)
 Consider trial of zinc oxide cream to perianal area as needed for irritation.

## 2023-07-10 NOTE — Progress Notes (Signed)
 Established Patient Office Visit  Subjective   Patient ID: Peter Snyder, male    DOB: October 21, 1955  Age: 68 y.o. MRN: [id number]  Chief Complaint  Patient presents with   Hemorrhoids    HPI   Jarron is seen with some recent stool changes and ongoing perianal irritation intermittently.  He had some significant dyspepsia and we recently started Carafate .  This did help settle down his dyspepsia but he developed some constipation.  He had up starting MiraLAX and after taking this several days developed some loose stools about 2/day.  Has backed off the MiraLAX since then.  Currently on Metamucil 2 daily.  Last colonoscopy 2022.  Only noticed little blood once with stool but not consistently.  Frequently has some perianal pruritus and has taken topical steroids in the past  Past Medical History:  Diagnosis Date   Allergy    Allergy to sulfa drugs 12/10/2020   Anxiety 2/98   Arthritis    CAD (coronary artery disease) 08/2012   Cardiac catheterization 6/27/ 2014 ejection fraction 35-40%, 30% proximal left circumflex, 100% tiny obtuse marginal 1 with collaterals, 50% LAD, 50% D1, 100% RCA with collaterals   Chicken pox    Chronic back pain    Colon polyps    Depression    Diabetes mellitus (HCC)    Diet controlled- OFF metformin  02-01-20   E coli infection 12/10/2020   Folliculitis 12/10/2020   GERD (gastroesophageal reflux disease)    Hyperlipidemia    Hypertension    Kidney stone    Myocardial infarction [facility])    ? year- cath 2014/ june    Past Surgical History:  Procedure Laterality Date   CATARACT EXTRACTION  05/2023   COLONOSCOPY     CRYO INTERCOSTAL NERVE BLOCK     double sacrum nerve block   epidural injections     multiple- L5 S1 and cervical neck pain issues as well    POLYPECTOMY     TONSILLECTOMY  1963   TRIGGER FINGER RELEASE  2019    reports that he quit smoking about 25 years ago. His smoking use included cigarettes. He started smoking about 53 years ago. He  has a 7 pack-year smoking history. He has never used smokeless tobacco. He reports that he does not drink alcohol and does not use drugs. family history includes ADD / ADHD in his brother; Alcohol abuse in his son; Arthritis in his father and paternal grandmother; COPD in his father; Cancer in his mother; Clotting disorder in his paternal grandfather and paternal grandmother; Colon polyps in his father; Early death in his brother; Heart disease in his maternal grandmother; Hyperlipidemia in his maternal grandmother; Hypertension in his mother; Liver cancer in his paternal grandfather; Melanoma in his mother. Allergies  Allergen Reactions   Doxycycline  Hyclate Nausea And Vomiting   Latex Other (See Comments)    Other   Tape     Use Paper Tape    Sulfa Antibiotics Rash    Review of Systems  Constitutional:  Negative for weight loss.  Cardiovascular:  Negative for chest pain.  Gastrointestinal:  Negative for melena, nausea and vomiting.       See HPI      Objective:     BP 130/60 (BP Location: Left Arm, Patient Position: Sitting, Cuff Size: Normal)   Pulse (!) 59   Temp 98.2 F (36.8 C) (Oral)   Wt 207 lb 11.2 oz (94.2 kg)   SpO2 97%   BMI 26.67 kg/m  BP  Readings from Last 3 Encounters:  07/10/23 130/60  06/26/23 118/62  06/17/23 120/60   Wt Readings from Last 3 Encounters:  07/10/23 207 lb 11.2 oz (94.2 kg)  06/26/23 207 lb 8 oz (94.1 kg)  06/17/23 207 lb 9.6 oz (94.2 kg)      Physical Exam Vitals reviewed.  Constitutional:      Appearance: Normal appearance.  Cardiovascular:     Rate and Rhythm: Normal rate and regular rhythm.  Pulmonary:     Effort: Pulmonary effort is normal.     Breath sounds: Normal breath sounds.  Genitourinary:    Comments: Perianal exam reveals some hypopigmentation diffusely perianal region with some mild skin atrophy.  No external hemorrhoids.  No fissure.  No concerning skin lesions Neurological:     Mental Status: He is alert.       No results found for any visits on 07/10/23.    The 10-year ASCVD risk score (Arnett DK, et al., 2019) is: 30.8%    Assessment & Plan:   Perianal irritation possibly related to recent loose stools..  Continue to withhold MiraLAX.  Avoid regular use of topical steroids.  Consider short-term use of zinc oxide as needed  Glean Lamy, MD

## 2023-07-21 ENCOUNTER — Ambulatory Visit: Admitting: Family Medicine

## 2023-07-24 ENCOUNTER — Ambulatory Visit (INDEPENDENT_AMBULATORY_CARE_PROVIDER_SITE_OTHER): Admitting: Podiatrist

## 2023-07-24 ENCOUNTER — Encounter: Payer: Self-pay | Admitting: Podiatrist

## 2023-07-24 ENCOUNTER — Ambulatory Visit (INDEPENDENT_AMBULATORY_CARE_PROVIDER_SITE_OTHER)

## 2023-07-24 VITALS — Ht 74.0 in | Wt 207.0 lb

## 2023-07-24 DIAGNOSIS — M7752 Other enthesopathy of left foot: Secondary | ICD-10-CM | POA: Diagnosis not present

## 2023-07-24 DIAGNOSIS — G8929 Other chronic pain: Secondary | ICD-10-CM | POA: Diagnosis not present

## 2023-07-24 DIAGNOSIS — M79674 Pain in right toe(s): Secondary | ICD-10-CM

## 2023-07-24 NOTE — Progress Notes (Signed)
 Chief Complaint  Patient presents with   Toe Pain    Patient is here for left 2nd toe pain       HPI: Patient is 68 y.o. male who presents today for  pain between metatarsals 4/5 left foot.  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  Injection therapy is helpful. He is an original patient of Dr. Meriam Stamp and states over the years, injection therapy has helped keep his feet feeling OK.  He wears good HOKA sneakers and does a fair amount of walking for exercise.     Allergies  Allergen Reactions   Doxycycline  Hyclate Nausea And Vomiting   Latex Other (See Comments)    Other   Tape     Use Paper Tape    Sulfa Antibiotics Rash    Review of systems is negative except as noted in the HPI.  Denies nausea/ vomiting/ fevers/ chills or night sweats.   Denies difficulty breathing, denies calf pain or tenderness  Physical Exam  Patient is awake, alert, and oriented x 3.  In no acute distress.    Vascular status is intact with palpable pedal pulses DP and PT bilateral and capillary refill time less than 3 seconds bilateral.  No edema or erythema noted.   Neurological exam reveals epicritic and protective sensation grossly intact bilateral.   Dermatological exam reveals right hallux nail is improved after Dr. Luster Salters smoothed the nail plate for him-  split in the nail present but non painful  Musculoskeletal exam: crossover toe deformity left 2/3 noted.  Pain between metatarsal heads 4/5 noted.  Widening of the forefoot is seen clinically.   Assessment:     ICD-10-CM   1. Toe pain, chronic, right  M79.674 DG Foot Complete Right   G89.29     2. Capsulitis of metatarsophalangeal (MTP) joint of left foot  M77.52      Xrays:  3 views left foot obtained.Flattening of the first metatarsal head and base of proximal phalanx is noted left first MPJ.  Arthritic changes noted first MPJ.  Dislocation of the 2nd and 3rd metatarsophalangeal joints noted with  crossover toe deformity noted.  Adductovarus rotation left toes 4 and 5 also seen.  Widening of forefoot noted on x-ray.     Plan: Discussed exam findings and treatment recommendations.  Injection therapy has been beneficial for him in the past therefore I offered him an injection at today's visit which he agreed.  I prepped the skin with alcohol infiltrated 10 mg of Kenalog  with Marcaine plain in the fourth interspace left foot.  He tolerated this well.  Recommend he continue with his Hoka shoes and to call us  if any areas of pain arise or if this fails to improve his symptoms in 2 weeks

## 2023-07-28 ENCOUNTER — Ambulatory Visit (INDEPENDENT_AMBULATORY_CARE_PROVIDER_SITE_OTHER): Admitting: Family Medicine

## 2023-07-28 ENCOUNTER — Encounter: Payer: Self-pay | Admitting: Family Medicine

## 2023-07-28 VITALS — BP 150/78 | HR 71 | Temp 98.5°F | Wt 210.2 lb

## 2023-07-28 DIAGNOSIS — E1165 Type 2 diabetes mellitus with hyperglycemia: Secondary | ICD-10-CM

## 2023-07-28 DIAGNOSIS — R972 Elevated prostate specific antigen [PSA]: Secondary | ICD-10-CM

## 2023-07-28 LAB — POCT GLYCOSYLATED HEMOGLOBIN (HGB A1C): Hemoglobin A1C: 6.4 % — AB (ref 4.0–5.6)

## 2023-07-28 NOTE — Patient Instructions (Signed)
 A1C is stable at 6.4%  Remember to get PSA repeat in couple of months.

## 2023-07-28 NOTE — Progress Notes (Signed)
 Established Patient Office Visit  Subjective   Patient ID: Peter Snyder, male    DOB: January 10, 1956  Age: 68 y.o. MRN: [id number]  Chief Complaint  Patient presents with   Blood Sugar Problem    HPI   Peter Snyder is seen with concern for possible elevated blood sugars.  He has longstanding history of diabetes which has been managed without medications currently.  Was briefly on metformin .  He has had 5 steroid injections recently per orthopedics and that was his reason for concern.  He has still been exercising diligently.  He has done a great job managing this over the years and last A1c was 6.3%.  He has not had any significant polyuria or polydipsia.  He has also had some concerns regarding recent PSA of 2.92.  Does have history of BPH.  No recent dysuria.  PSA was checked in February.  Even though this was normal had increased significantly from prior check.  We had suggested 34-month follow-up to recheck PSA sometime around July or August.  Past Medical History:  Diagnosis Date   Allergy    Allergy to sulfa drugs 12/10/2020   Anxiety 2/98   Arthritis    CAD (coronary artery disease) 08/2012   Cardiac catheterization 6/27/ 2014 ejection fraction 35-40%, 30% proximal left circumflex, 100% tiny obtuse marginal 1 with collaterals, 50% LAD, 50% D1, 100% RCA with collaterals   Chicken pox    Chronic back pain    Colon polyps    Depression    Diabetes mellitus (HCC)    Diet controlled- OFF metformin  02-01-20   E coli infection 12/10/2020   Folliculitis 12/10/2020   GERD (gastroesophageal reflux disease)    Hyperlipidemia    Hypertension    Kidney stone    Myocardial infarction [facility])    ? year- cath 2014/ june    Past Surgical History:  Procedure Laterality Date   CATARACT EXTRACTION  05/2023   COLONOSCOPY     CRYO INTERCOSTAL NERVE BLOCK     double sacrum nerve block   epidural injections     multiple- L5 S1 and cervical neck pain issues as well    POLYPECTOMY      TONSILLECTOMY  1963   TRIGGER FINGER RELEASE  2019    reports that he quit smoking about 25 years ago. His smoking use included cigarettes. He started smoking about 53 years ago. He has a 7 pack-year smoking history. He has never used smokeless tobacco. He reports that he does not drink alcohol and does not use drugs. family history includes ADD / ADHD in his brother; Alcohol abuse in his son; Arthritis in his father and paternal grandmother; COPD in his father; Cancer in his mother; Clotting disorder in his paternal grandfather and paternal grandmother; Colon polyps in his father; Early death in his brother; Heart disease in his maternal grandmother; Hyperlipidemia in his maternal grandmother; Hypertension in his mother; Liver cancer in his paternal grandfather; Melanoma in his mother. Allergies  Allergen Reactions   Doxycycline  Hyclate Nausea And Vomiting   Latex Other (See Comments)    Other   Tape     Use Paper Tape    Sulfa Antibiotics Rash    Review of Systems  Constitutional:  Negative for weight loss.  Genitourinary:  Negative for frequency.      Objective:      BP (!) 150/78 (BP Location: Left Arm, Patient Position: Sitting, Cuff Size: Normal)   Pulse 71   Temp 98.5 F (36.9 C) (  Oral)   Wt 210 lb 3.2 oz (95.3 kg)   SpO2 95%   BMI 26.99 kg/m  BP Readings from Last 3 Encounters:  07/28/23 (!) 150/78  07/10/23 130/60  06/26/23 118/62   Wt Readings from Last 3 Encounters:  07/28/23 210 lb 3.2 oz (95.3 kg)  07/24/23 207 lb (93.9 kg)  07/10/23 207 lb 11.2 oz (94.2 kg)      Physical Exam Vitals reviewed.  Constitutional:      General: He is not in acute distress.    Appearance: He is not ill-appearing.  Cardiovascular:     Rate and Rhythm: Normal rate and regular rhythm.  Pulmonary:     Effort: Pulmonary effort is normal.     Breath sounds: Normal breath sounds.  Neurological:     Mental Status: He is alert.      Results for orders placed or performed in  visit on 07/28/23  POC HgB A1c  Result Value Ref Range   Hemoglobin A1C 6.4 (A) 4.0 - 5.6 %   HbA1c POC (<> result, manual entry)     HbA1c, POC (prediabetic range)     HbA1c, POC (controlled diabetic range)        The 10-year ASCVD risk score (Arnett DK, et al., 2019) is: 37.9%    Assessment & Plan:   #1 history of type 2 diabetes.  A1c today 6.4% which is very stable and surprisingly low in consideration of the fact has had 5 reported steroid injections over the past several weeks.  Continue lower glycemic diet and regular exercise.  Recheck in 3 to 6 months We did discuss if A1c is ever climbing we might want to consider medication such as Jardiance with his cardiac history  #2 PSA velocity.  Recent PSA 2.92.  Recheck in 2 to 3 months     No follow-ups on file.    Glean Lamy, MD

## 2023-08-03 ENCOUNTER — Encounter: Payer: Self-pay | Admitting: Cardiology

## 2023-08-05 NOTE — Telephone Encounter (Signed)
 FYI

## 2023-08-17 ENCOUNTER — Ambulatory Visit (INDEPENDENT_AMBULATORY_CARE_PROVIDER_SITE_OTHER): Admitting: Podiatry

## 2023-08-17 ENCOUNTER — Encounter: Payer: Self-pay | Admitting: Podiatry

## 2023-08-17 DIAGNOSIS — M19072 Primary osteoarthritis, left ankle and foot: Secondary | ICD-10-CM

## 2023-08-17 MED ORDER — BETAMETHASONE SOD PHOS & ACET 6 (3-3) MG/ML IJ SUSP
3.0000 mg | Freq: Once | INTRAMUSCULAR | Status: AC
  Administered 2023-08-17: 3 mg via INTRA_ARTICULAR

## 2023-08-17 NOTE — Progress Notes (Signed)
 Chief Complaint  Patient presents with   Injections    "I think I need an injection on this side now. (Lateral left)    Callouses    "I have a corn on the bottom of my right foot that I would like for him to trim." N - corn L - 1st met D - 3-4 mos. O - gradually comes back C - skin builds up A - I do a lot on the treadmill T - my wife files it    HPI: 68 y.o. male presenting today for new complaint of pain and tenderness to the lateral aspect of the left foot as well as the ankle.  Chronic arthritic pain.    Patient also has an established history of arthritis with DJD to the fourth MTP of the left foot.  Injections in the past have helped significantly.  He presents for further treatment evaluation  Past Medical History:  Diagnosis Date   Allergy    Allergy to sulfa drugs 12/10/2020   Anxiety 2/98   Arthritis    CAD (coronary artery disease) 08/2012   Cardiac catheterization 6/27/ 2014 ejection fraction 35-40%, 30% proximal left circumflex, 100% tiny obtuse marginal 1 with collaterals, 50% LAD, 50% D1, 100% RCA with collaterals   Chicken pox    Chronic back pain    Colon polyps    Depression    Diabetes mellitus (HCC)    Diet controlled- OFF metformin  02-01-20   E coli infection 12/10/2020   Folliculitis 12/10/2020   GERD (gastroesophageal reflux disease)    Hyperlipidemia    Hypertension    Kidney stone    Myocardial infarction [facility])    ? year- cath 2014/ june     Past Surgical History:  Procedure Laterality Date   CATARACT EXTRACTION  05/2023   COLONOSCOPY     CRYO INTERCOSTAL NERVE BLOCK     double sacrum nerve block   epidural injections     multiple- L5 S1 and cervical neck pain issues as well    POLYPECTOMY     TONSILLECTOMY  1963   TRIGGER FINGER RELEASE  2019    Allergies  Allergen Reactions   Doxycycline  Hyclate Nausea And Vomiting   Latex Other (See Comments)    Other   Tape     Use Paper Tape    Sulfa Antibiotics Rash     Physical  Exam: General: The patient is alert and oriented x3 in no acute distress.  Dermatology: Skin is warm, dry and supple bilateral lower extremities.  Hyperkeratotic nail plate noted to the medial border of the right hallux which is intruding into the medial nail fold and causing sensitivity  Vascular: Palpable pedal pulses bilaterally. Capillary refill within normal limits.  No appreciable edema.  No erythema.  Neurological: Grossly intact via light touch  Musculoskeletal Exam: Today there is tenderness to palpation over the anterolateral aspect of the left ankle as well as the lateral aspect and lateral column of the foot.  Range of motion WNL.  Muscle strength 5/5 all compartments.  DG Foot Complete Left 04/01/2022 IMPRESSION: 1. Significant MTP joint degenerative changes with possible changes of psoriatic arthritis. 2. No findings for osteomyelitis involving the fifth toe.  Assessment/Plan of Care: 1.  Arthritis left ankle 2.  Capsulitis left foot lateral column  -Patient evaluated - Injection of 0.5 cc Celestone  Soluspan injected into the anterolateral aspect of the left ankle as well as the lateral column of the foot -Continue wearing Hoka tennis  shoes -Return to clinic PRN       Dot Gazella, DPM [facility]  Dr. Dot Gazella, DPM    [address] [place], [place] [address]                Office [phone]  Fax [phone]

## 2023-08-26 ENCOUNTER — Ambulatory Visit: Admitting: Podiatry

## 2023-08-28 ENCOUNTER — Encounter: Payer: Self-pay | Admitting: Cardiology

## 2023-09-01 ENCOUNTER — Encounter: Payer: Self-pay | Admitting: Family Medicine

## 2023-09-01 ENCOUNTER — Ambulatory Visit (INDEPENDENT_AMBULATORY_CARE_PROVIDER_SITE_OTHER): Admitting: Family Medicine

## 2023-09-01 VITALS — BP 146/80 | HR 76 | Temp 97.9°F | Wt 209.8 lb

## 2023-09-01 DIAGNOSIS — I10 Essential (primary) hypertension: Secondary | ICD-10-CM

## 2023-09-01 DIAGNOSIS — R300 Dysuria: Secondary | ICD-10-CM | POA: Diagnosis not present

## 2023-09-01 LAB — POC URINALSYSI DIPSTICK (AUTOMATED)
Bilirubin, UA: NEGATIVE
Blood, UA: NEGATIVE
Glucose, UA: NEGATIVE
Ketones, UA: NEGATIVE
Leukocytes, UA: NEGATIVE
Nitrite, UA: NEGATIVE
Protein, UA: POSITIVE — AB
Spec Grav, UA: 1.025 (ref 1.010–1.025)
Urobilinogen, UA: 0.2 U/dL
pH, UA: 6 (ref 5.0–8.0)

## 2023-09-01 NOTE — Patient Instructions (Signed)
 Monitor home blood pressures twice daily and record  BRING BACK YOUR CUFF AT FOLLOW UP TO COMPARE WITH OURS.   Urinalysis shows no sign of infection.

## 2023-09-01 NOTE — Progress Notes (Signed)
 Established Patient Office Visit  Subjective   Patient ID: Peter Snyder, male    DOB: Sep 14, 1955  Age: 68 y.o. MRN: [id number]  Chief Complaint  Patient presents with   Dysuria    HPI   Peter Snyder is seen with 1 day history of some mild dysuria.  Very mild burning with urination last night.  No slow stream.  No fevers or chills.  No flank pain.  No nausea or vomiting.  Denies any recent history of UTI.  No recent bladder instrumentation.  Symptoms better today.  No recent gross hematuria.  Past Medical History:  Diagnosis Date   Allergy    Allergy to sulfa drugs 12/10/2020   Anxiety 2/98   Arthritis    CAD (coronary artery disease) 08/2012   Cardiac catheterization 6/27/ 2014 ejection fraction 35-40%, 30% proximal left circumflex, 100% tiny obtuse marginal 1 with collaterals, 50% LAD, 50% D1, 100% RCA with collaterals   Chicken pox    Chronic back pain    Colon polyps    Depression    Diabetes mellitus (HCC)    Diet controlled- OFF metformin  02-01-20   E coli infection 12/10/2020   Folliculitis 12/10/2020   GERD (gastroesophageal reflux disease)    Hyperlipidemia    Hypertension    Kidney stone    Myocardial infarction [facility])    ? year- cath 2014/ june    Past Surgical History:  Procedure Laterality Date   CATARACT EXTRACTION  05/2023   COLONOSCOPY     CRYO INTERCOSTAL NERVE BLOCK     double sacrum nerve block   epidural injections     multiple- L5 S1 and cervical neck pain issues as well    POLYPECTOMY     TONSILLECTOMY  1963   TRIGGER FINGER RELEASE  2019    reports that he quit smoking about 25 years ago. His smoking use included cigarettes. He started smoking about 53 years ago. He has a 7 pack-year smoking history. He has never used smokeless tobacco. He reports that he does not drink alcohol and does not use drugs. family history includes ADD / ADHD in his brother; Alcohol abuse in his son; Arthritis in his father and paternal grandmother; COPD in his father;  Cancer in his mother; Clotting disorder in his paternal grandfather and paternal grandmother; Colon polyps in his father; Early death in his brother; Heart disease in his maternal grandmother; Hyperlipidemia in his maternal grandmother; Hypertension in his mother; Liver cancer in his paternal grandfather; Melanoma in his mother. Allergies  Allergen Reactions   Doxycycline  Hyclate Nausea And Vomiting   Latex Other (See Comments)    Other   Tape     Use Paper Tape    Sulfa Antibiotics Rash    Review of Systems  Constitutional:  Negative for chills and fever.  Genitourinary:  Positive for dysuria. Negative for flank pain, frequency and hematuria.      Objective:     BP (!) 146/80   Pulse 76   Temp 97.9 F (36.6 C) (Oral)   Wt 209 lb 12.8 oz (95.2 kg)   SpO2 96%   BMI 26.94 kg/m  BP Readings from Last 3 Encounters:  09/01/23 (!) 146/80  07/28/23 (!) 150/78  07/10/23 130/60   Wt Readings from Last 3 Encounters:  09/01/23 209 lb 12.8 oz (95.2 kg)  07/28/23 210 lb 3.2 oz (95.3 kg)  07/24/23 207 lb (93.9 kg)      Physical Exam Vitals reviewed.  Constitutional:  General: He is not in acute distress.    Appearance: He is not ill-appearing or toxic-appearing.   Cardiovascular:     Rate and Rhythm: Normal rate and regular rhythm.  Pulmonary:     Effort: Pulmonary effort is normal.     Breath sounds: Normal breath sounds. No wheezing or rales.   Neurological:     Mental Status: He is alert.      Results for orders placed or performed in visit on 09/01/23  POCT Urinalysis Dipstick (Automated)  Result Value Ref Range   Color, UA Yellow    Clarity, UA Clear    Glucose, UA Negative Negative   Bilirubin, UA Negative    Ketones, UA Negative    Spec Grav, UA 1.025 1.010 - 1.025   Blood, UA Negative    pH, UA 6.0 5.0 - 8.0   Protein, UA Positive (A) Negative   Urobilinogen, UA 0.2 0.2 or 1.0 E.U./dL   Nitrite, UA Negative    Leukocytes, UA Negative Negative       The ASCVD Risk score (Arnett DK, et al., 2019) failed to calculate for the following reasons:   Risk score cannot be calculated because patient has a medical history suggesting prior/existing ASCVD    Assessment & Plan:   Problem List Items Addressed This Visit   None Visit Diagnoses       Dysuria    -  Primary   Relevant Orders   POCT Urinalysis Dipstick (Automated) (Completed)     Patient had onset last night of transient dysuria.  Symptoms improved today.  Urine dipstick today shows only some proteinuria which we explained is very nonspecific.  Negative for blood, nitrites, or leukocytes.  Recommend plenty of fluids and observe for now.  Did have slightly elevated blood pressure today.  We recommend he monitor blood pressures regularly at home next few weeks and bring in his readings along with his cuff for comparison in about 3 weeks to reassess.  He is currently on Entresto  twice daily.  If blood pressures remain up may need to bump Entresto  dosage versus additional medication  Return in about 3 weeks (around 09/22/2023).    Glean Lamy, MD

## 2023-09-03 ENCOUNTER — Ambulatory Visit (INDEPENDENT_AMBULATORY_CARE_PROVIDER_SITE_OTHER): Admitting: Podiatry

## 2023-09-03 ENCOUNTER — Encounter: Payer: Self-pay | Admitting: Podiatry

## 2023-09-03 DIAGNOSIS — M25572 Pain in left ankle and joints of left foot: Secondary | ICD-10-CM

## 2023-09-03 NOTE — Progress Notes (Signed)
 Presents with complaint of pain over the lateral foot today.  Indicates from the sinus tarsi to the fourth met the fifth met cuboid joint.  Has been bothered with walking.  Had injections several weeks ago by Dr. Luster Salters.  Has not noticed any redness or ecchymosis   Physical exam:  General appearance: Pleasant, and in no acute distress. AOx3.  Vascular: Pedal pulses: DP 2/4 bilaterally, PT 2/4 bilaterally.  Mild edema lower legs bilaterally. Capillary fill time immediate.  Neurological: Light touch intact feet bilaterally.  Normal Achilles reflex bilaterally.    Dermatologic:   Skin normal temperature bilaterally.  Skin normal color, tone, and texture bilaterally.   Musculoskeletal: Tenderness with palpation of the sinus tarsi and along the lateral subtalar joint.  Tenderness with range of motion of the subtalar joint.  No tenderness at the anterior ankle.  No tenderness at the fourth fifth met cuboid joint.    Diagnosis: Arthralgia subtalar joint left  Plan: -Continue wearing good shoes generally wears the Hoka's.  No told him this is fine. He can use Voltaren gel as needed.  Ice as needed -injected 3cc 2:1 mixture 0.5 cc Marcaine:Kenolog 10mg /13ml at talar joint left at sinus tarsi.    Return 2 weeks follow-up injection STJ left

## 2023-09-04 ENCOUNTER — Ambulatory Visit: Admitting: Family Medicine

## 2023-09-04 ENCOUNTER — Encounter: Payer: Self-pay | Admitting: Family Medicine

## 2023-09-04 VITALS — BP 122/60 | HR 71 | Temp 98.3°F | Wt 212.0 lb

## 2023-09-04 DIAGNOSIS — I10 Essential (primary) hypertension: Secondary | ICD-10-CM | POA: Diagnosis not present

## 2023-09-04 NOTE — Patient Instructions (Signed)
 Standing BP today dropped to 122/60 compared with 142/70  seated  Continue with current BP medication

## 2023-09-04 NOTE — Progress Notes (Signed)
 Established Patient Office Visit  Subjective   Patient ID: Peter Snyder, male    DOB: [birth year]  Age: 68 y.o. MRN: [id number]  Chief Complaint  Patient presents with   Medical Management of Chronic Issues    HPI   Peter Snyder is seen for follow-up regarding elevated blood pressure.  Had been doing well for some time but recently had increase in blood pressure here.  We had suggested he bring in his home cuff to compare with ours.  He remains diligent with exercise and no difficulties with walking.  No recent chest pains.  Takes Entresto  and carvedilol .  Occasional lightheadedness with for standing.  We discussed sodium intake previously.  Currently, he and his wife eat out most nights.  Past Medical History:  Diagnosis Date   Allergy    Allergy to sulfa drugs 12/10/2020   Anxiety 2/98   Arthritis    CAD (coronary artery disease) 08/2012   Cardiac catheterization 6/27/ 2014 ejection fraction 35-40%, 30% proximal left circumflex, 100% tiny obtuse marginal 1 with collaterals, 50% LAD, 50% D1, 100% RCA with collaterals   Chicken pox    Chronic back pain    Colon polyps    Depression    Diabetes mellitus (HCC)    Diet controlled- OFF metformin  02-01-20   E coli infection 12/10/2020   Folliculitis 12/10/2020   GERD (gastroesophageal reflux disease)    Hyperlipidemia    Hypertension    Kidney stone    Myocardial infarction [facility])    ? year- cath 2014/ june    Past Surgical History:  Procedure Laterality Date   CATARACT EXTRACTION  05/2023   COLONOSCOPY     CRYO INTERCOSTAL NERVE BLOCK     double sacrum nerve block   epidural injections     multiple- L5 S1 and cervical neck pain issues as well    POLYPECTOMY     TONSILLECTOMY  1963   TRIGGER FINGER RELEASE  2019    reports that he quit smoking about 25 years ago. His smoking use included cigarettes. He started smoking about 53 years ago. He has a 7 pack-year smoking history. He has never used smokeless tobacco. He reports that  he does not drink alcohol and does not use drugs. family history includes ADD / ADHD in his brother; Alcohol abuse in his son; Arthritis in his father and paternal grandmother; COPD in his father; Cancer in his mother; Clotting disorder in his paternal grandfather and paternal grandmother; Colon polyps in his father; Early death in his brother; Heart disease in his maternal grandmother; Hyperlipidemia in his maternal grandmother; Hypertension in his mother; Liver cancer in his paternal grandfather; Melanoma in his mother. Allergies  Allergen Reactions   Doxycycline  Hyclate Nausea And Vomiting   Latex Other (See Comments)    Other   Tape     Use Paper Tape    Sulfa Antibiotics Rash    Review of Systems  Constitutional:  Negative for malaise/fatigue.  Eyes:  Negative for blurred vision.  Respiratory:  Negative for shortness of breath.   Cardiovascular:  Negative for chest pain.  Neurological:  Negative for dizziness, weakness and headaches.      Objective:     BP (!) 140/74   Pulse 71   Temp 98.3 F (36.8 C) (Oral)   Wt 212 lb (96.2 kg)   SpO2 96%   BMI 27.22 kg/m  BP Readings from Last 3 Encounters:  09/04/23 122/60  09/01/23 (!) 146/80  07/28/23 (!) 150/78  Wt Readings from Last 3 Encounters:  09/04/23 212 lb (96.2 kg)  09/01/23 209 lb 12.8 oz (95.2 kg)  07/28/23 210 lb 3.2 oz (95.3 kg)      Physical Exam Vitals reviewed.  Constitutional:      General: He is not in acute distress.    Appearance: He is not ill-appearing.   Cardiovascular:     Rate and Rhythm: Normal rate and regular rhythm.  Pulmonary:     Effort: Pulmonary effort is normal.     Breath sounds: Normal breath sounds.   Musculoskeletal:     Right lower leg: No edema.     Left lower leg: No edema.   Neurological:     Mental Status: He is alert.      No results found for any visits on 09/04/23.    The ASCVD Risk score (Arnett DK, et al., 2019) failed to calculate for the following  reasons:   Risk score cannot be calculated because patient has a medical history suggesting prior/existing ASCVD    Assessment & Plan:   Hypertension.  Has had a few elevated readings but interestingly today left arm seated blood pressure 142/70 and this dropped to 122/60 standing.  He feels like he is fairly well-hydrated.  We have recommended no change in his medications at this point.  Continue monitoring.  Try to keep daily sodium intake less than 2500 mg daily.  Continue regular walking habits. Had fairly substantial drop in blood pressure today standing so we need to be careful not to get too overly aggressive with treating his seated blood pressure  Glean Lamy, MD

## 2023-09-05 ENCOUNTER — Other Ambulatory Visit: Payer: Self-pay | Admitting: Family Medicine

## 2023-09-06 ENCOUNTER — Other Ambulatory Visit: Payer: Self-pay | Admitting: Family Medicine

## 2023-09-10 ENCOUNTER — Encounter: Payer: Self-pay | Admitting: Podiatry

## 2023-09-10 ENCOUNTER — Ambulatory Visit: Admitting: Podiatry

## 2023-09-10 DIAGNOSIS — M25572 Pain in left ankle and joints of left foot: Secondary | ICD-10-CM

## 2023-09-10 MED ORDER — TRIAMCINOLONE ACETONIDE 10 MG/ML IJ SUSP
10.0000 mg | Freq: Once | INTRAMUSCULAR | Status: AC
  Administered 2023-09-10: 10 mg

## 2023-09-10 NOTE — Progress Notes (Signed)
 Presents with complaint follow-up injection left foot.  Still having pain not a lot of improvement.  Has not noticed any redness or swelling   Physical exam:  General appearance: Pleasant, and in no acute distress. AOx3.  Vascular: Pedal pulses: DP 2/4 bilaterally, PT 2/4 bilaterally.  Mild edema lower legs bilaterally. Capillary fill time immediate.  Neurological: Light touch intact feet bilaterally.  Normal Achilles reflex bilaterally.    Dermatologic:   Skin normal temperature bilaterally.  Skin normal color, tone, and texture bilaterally.   Musculoskeletal: Tenderness at lateral naviculocuneiform joints left.  Some tenderness in the sinus tarsi left.  No tenderness along extensor tendons.    Diagnosis: 1.  Arthralgia naviculocuneiform joints left  Plan: -Continue wearing proper shoes and icing and using Voltaren gel as needed -injected 3cc 2:1 mixture 0.5 cc Marcaine:Kenolog 10mg /6ml at naviculocuneiform joints left.    Return 2 weeks follow-up injection

## 2023-09-14 ENCOUNTER — Ambulatory Visit: Admitting: Podiatry

## 2023-09-16 ENCOUNTER — Ambulatory Visit: Admitting: Podiatry

## 2023-09-22 ENCOUNTER — Encounter: Payer: Self-pay | Admitting: Family Medicine

## 2023-09-22 ENCOUNTER — Ambulatory Visit (INDEPENDENT_AMBULATORY_CARE_PROVIDER_SITE_OTHER): Admitting: Family Medicine

## 2023-09-22 VITALS — BP 136/80 | HR 72 | Temp 97.6°F | Wt 210.9 lb

## 2023-09-22 DIAGNOSIS — R972 Elevated prostate specific antigen [PSA]: Secondary | ICD-10-CM | POA: Diagnosis not present

## 2023-09-22 DIAGNOSIS — I10 Essential (primary) hypertension: Secondary | ICD-10-CM

## 2023-09-22 DIAGNOSIS — J019 Acute sinusitis, unspecified: Secondary | ICD-10-CM

## 2023-09-22 MED ORDER — AZELASTINE HCL 137 MCG/SPRAY NA SOLN: NASAL | 1 refills | Status: AC

## 2023-09-22 NOTE — Progress Notes (Signed)
 Established Patient Office Visit  Subjective   Patient ID: Peter Snyder, male    DOB: Nov 26, 1955  Age: 68 y.o. MRN: [id number]  Chief Complaint  Patient presents with   Medical Management of Chronic Issues    HPI   Peter Snyder is seen for follow-up regarding recent elevated blood pressures.  He had some readings over 140.  He has been monitoring regularly and getting readings mostly 120s to 130s systolic with recent low of even 105/55.  He remains on same dose of Entresto  and also carvedilol .  Exercises regularly.  Does eat out quite often.  He is meeting with nutritionist.  No headaches or chest pain.  No regular nonsteroidal use.  He had urine microalbumin creatinine ratio 26 4 months ago and we had suggested follow-up in August.  He also had bump in his PSA that time and we planned follow-up for PSA at that point as well   Past Medical History:  Diagnosis Date   Allergy    Allergy to sulfa drugs 12/10/2020   Anxiety 2/98   Arthritis    CAD (coronary artery disease) 08/2012   Cardiac catheterization 6/27/ 2014 ejection fraction 35-40%, 30% proximal left circumflex, 100% tiny obtuse marginal 1 with collaterals, 50% LAD, 50% D1, 100% RCA with collaterals   Chicken pox    Chronic back pain    Colon polyps    Depression    Diabetes mellitus (HCC)    Diet controlled- OFF metformin  02-01-20   E coli infection 12/10/2020   Folliculitis 12/10/2020   GERD (gastroesophageal reflux disease)    Hyperlipidemia    Hypertension    Kidney stone    Myocardial infarction [facility])    ? year- cath 2014/ june    Past Surgical History:  Procedure Laterality Date   CATARACT EXTRACTION  05/2023   COLONOSCOPY     CRYO INTERCOSTAL NERVE BLOCK     double sacrum nerve block   epidural injections     multiple- L5 S1 and cervical neck pain issues as well    POLYPECTOMY     TONSILLECTOMY  1963   TRIGGER FINGER RELEASE  2019    reports that he quit smoking about 25 years ago. His smoking use  included cigarettes. He started smoking about 53 years ago. He has a 7 pack-year smoking history. He has never used smokeless tobacco. He reports that he does not drink alcohol and does not use drugs. family history includes ADD / ADHD in his brother; Alcohol abuse in his son; Arthritis in his father and paternal grandmother; COPD in his father; Cancer in his mother; Clotting disorder in his paternal grandfather and paternal grandmother; Colon polyps in his father; Early death in his brother; Heart disease in his maternal grandmother; Hyperlipidemia in his maternal grandmother; Hypertension in his mother; Liver cancer in his paternal grandfather; Melanoma in his mother. Allergies  Allergen Reactions   Doxycycline  Hyclate Nausea And Vomiting   Latex Other (See Comments)    Other   Tape     Use Paper Tape    Sulfa Antibiotics Rash    Review of Systems  Constitutional:  Negative for malaise/fatigue.  Eyes:  Negative for blurred vision.  Respiratory:  Negative for shortness of breath.   Cardiovascular:  Negative for chest pain.  Neurological:  Negative for dizziness, weakness and headaches.      Objective:     BP 136/80 (BP Location: Left Arm, Patient Position: Sitting, Cuff Size: Normal)   Pulse 72  Temp 97.6 F (36.4 C) (Oral)   Wt 210 lb 14.4 oz (95.7 kg)   SpO2 99%   BMI 27.08 kg/m  BP Readings from Last 3 Encounters:  09/22/23 136/80  09/04/23 122/60  09/01/23 (!) 146/80   Wt Readings from Last 3 Encounters:  09/22/23 210 lb 14.4 oz (95.7 kg)  09/04/23 212 lb (96.2 kg)  09/01/23 209 lb 12.8 oz (95.2 kg)      Physical Exam Vitals reviewed.  Constitutional:      General: He is not in acute distress.    Appearance: He is well-developed. He is not ill-appearing.  Eyes:     Pupils: Pupils are equal, round, and reactive to light.  Neck:     Thyroid : No thyromegaly.  Cardiovascular:     Rate and Rhythm: Normal rate and regular rhythm.  Pulmonary:     Effort:  Pulmonary effort is normal. No respiratory distress.     Breath sounds: Normal breath sounds. No wheezing or rales.  Musculoskeletal:     Cervical back: Neck supple.     Right lower leg: No edema.     Left lower leg: No edema.  Neurological:     Mental Status: He is alert and oriented to person, place, and time.      No results found for any visits on 09/22/23.  Last CBC Lab Results  Component Value Date   WBC 5.7 05/04/2023   HGB 13.2 05/04/2023   HCT 39.1 05/04/2023   MCV 91.1 05/04/2023   MCH 30.6 12/18/2021   RDW 13.5 05/04/2023   PLT 136.0 (L) 05/04/2023   Last metabolic panel Lab Results  Component Value Date   GLUCOSE 127 (H) 05/04/2023   NA 139 05/04/2023   K 4.3 05/04/2023   CL 101 05/04/2023   CO2 31 05/04/2023   BUN 23 05/04/2023   CREATININE 1.04 05/04/2023   GFR 74.06 05/04/2023   CALCIUM  8.8 05/04/2023   PROT 6.5 05/04/2023   ALBUMIN 4.3 05/04/2023   BILITOT 0.5 05/04/2023   ALKPHOS 64 05/04/2023   AST 16 05/04/2023   ALT 16 05/04/2023   ANIONGAP 7 12/18/2021   Last lipids Lab Results  Component Value Date   CHOL 138 05/04/2023   HDL 38.00 (L) 05/04/2023   LDLCALC 79 05/04/2023   LDLDIRECT 70.0 03/20/2016   TRIG 108.0 05/04/2023   CHOLHDL 4 05/04/2023   Last hemoglobin A1c Lab Results  Component Value Date   HGBA1C 6.4 (A) 07/28/2023   Last thyroid  functions Lab Results  Component Value Date   TSH 1.62 03/30/2020      The ASCVD Risk score (Arnett DK, et al., 2019) failed to calculate for the following reasons:   Risk score cannot be calculated because patient has a medical history suggesting prior/existing ASCVD    Assessment & Plan:   #1 hypertension.  Overall stable by home readings.  He had sporadic readings up around 140 systolic but mostly 120s to 130s.  Continue current dose of Entresto  and carvedilol .  Continue low-sodium diet.  Continue regular aerobic exercise.  Be in touch if systolics consistently over 140.  #2  increased PSA velocity with last check.  Recheck PSA in August.  [name], MD

## 2023-09-24 ENCOUNTER — Encounter: Payer: Self-pay | Admitting: Podiatry

## 2023-09-24 ENCOUNTER — Ambulatory Visit: Admitting: Podiatry

## 2023-09-24 DIAGNOSIS — M25572 Pain in left ankle and joints of left foot: Secondary | ICD-10-CM

## 2023-09-24 DIAGNOSIS — M79672 Pain in left foot: Secondary | ICD-10-CM

## 2023-09-24 DIAGNOSIS — B351 Tinea unguium: Secondary | ICD-10-CM

## 2023-09-24 DIAGNOSIS — M79671 Pain in right foot: Secondary | ICD-10-CM

## 2023-09-24 MED ORDER — TRIAMCINOLONE ACETONIDE 10 MG/ML IJ SUSP
10.0000 mg | Freq: Once | INTRAMUSCULAR | Status: AC
Start: 2023-09-24 — End: 2023-09-24
  Administered 2023-09-24: 10 mg

## 2023-09-24 NOTE — Progress Notes (Signed)
 Presents follow-up injection naviculocuneiform joints doing well over this area has pain over the lateral dorsal aspect of the foot indicates over the area of the calcaneocuboid joint.  Has not noticed any redness or ecchymosis   Physical exam:  General appearance: Pleasant, and in no acute distress. AOx3.  Vascular: Pedal pulses: DP 2/4 bilaterally, PT 2/4 bilaterally.  Minimal edema lower legs bilaterally. Capillary fill time immediate.  Neurological: Light touch intact feet bilaterally.  Normal Achilles reflex bilaterally.    Dermatologic:   Skin normal temperature bilaterally.  Skin normal color, tone, and texture bilaterally.   Musculoskeletal: Tenderness at the calcaneocuboid joint left foot.  No tenderness at the naviculocuneiform joints today.  No tenderness at sinus tarsi or subtalar joint.   Diagnosis: 1.  Arthralgia calcaneocuboid joint left  Plan: -Continue wearing good supportive shoes and use diclofenac gel as needed. -injected 3cc 2:1 mixture 0.5 cc Marcaine:Kenolog 10mg /84ml at [facility] cuboid joint left.     Return 4 weeks follow-up injection

## 2023-10-02 ENCOUNTER — Encounter: Payer: Self-pay | Admitting: Family Medicine

## 2023-10-05 ENCOUNTER — Encounter: Payer: Self-pay | Admitting: Family Medicine

## 2023-10-05 ENCOUNTER — Ambulatory Visit (INDEPENDENT_AMBULATORY_CARE_PROVIDER_SITE_OTHER): Admitting: Family Medicine

## 2023-10-05 VITALS — BP 130/60 | HR 78 | Temp 98.1°F | Wt 210.8 lb

## 2023-10-05 DIAGNOSIS — R0981 Nasal congestion: Secondary | ICD-10-CM

## 2023-10-05 NOTE — Progress Notes (Signed)
 Established Patient Office Visit  Subjective   Patient ID: Peter Snyder, male    DOB: 11-Jun-1955  Age: 68 y.o. MRN: [id number]  Chief Complaint  Patient presents with   Nasal Congestion    HPI   Peter Snyder is seen with increased nasal congestion for about a month now.  He does have longstanding history of allergies and currently takes Astelin  nasal along with Flonase  and uses Mucinex fairly regularly.  Has also used saline nasal sprays frequently in the past.  Back in 2014 when he lived in [place]  he had CT maxillofacial which showed polypoid nodular areas of thickening left ethmoid and right maxillary sinus region .  Denies any recent purulent secretions.  No fever.  No loss of taste or smell.  Past Medical History:  Diagnosis Date   Allergy    Allergy to sulfa drugs 12/10/2020   Anxiety 2/98   Arthritis    CAD (coronary artery disease) 08/2012   Cardiac catheterization 6/27/ 2014 ejection fraction 35-40%, 30% proximal left circumflex, 100% tiny obtuse marginal 1 with collaterals, 50% LAD, 50% D1, 100% RCA with collaterals   Chicken pox    Chronic back pain    Colon polyps    Depression    Diabetes mellitus (HCC)    Diet controlled- OFF metformin  02-01-20   E coli infection 12/10/2020   Folliculitis 12/10/2020   GERD (gastroesophageal reflux disease)    Hyperlipidemia    Hypertension    Kidney stone    Myocardial infarction [facility])    ? year- cath 2014/ june    Past Surgical History:  Procedure Laterality Date   CATARACT EXTRACTION  05/2023   COLONOSCOPY     CRYO INTERCOSTAL NERVE BLOCK     double sacrum nerve block   epidural injections     multiple- L5 S1 and cervical neck pain issues as well    POLYPECTOMY     TONSILLECTOMY  1963   TRIGGER FINGER RELEASE  2019    reports that he quit smoking about 25 years ago. His smoking use included cigarettes. He started smoking about 53 years ago. He has a 7 pack-year smoking history. He has never used smokeless tobacco. He  reports that he does not drink alcohol and does not use drugs. family history includes ADD / ADHD in his brother; Alcohol abuse in his son; Arthritis in his father and paternal grandmother; COPD in his father; Cancer in his mother; Clotting disorder in his paternal grandfather and paternal grandmother; Colon polyps in his father; Early death in his brother; Heart disease in his maternal grandmother; Hyperlipidemia in his maternal grandmother; Hypertension in his mother; Liver cancer in his paternal grandfather; Melanoma in his mother. Allergies  Allergen Reactions   Doxycycline  Hyclate Nausea And Vomiting   Latex Other (See Comments)    Other   Tape     Use Paper Tape    Sulfa Antibiotics Rash    Review of Systems  Constitutional:  Negative for chills and fever.  HENT:  Positive for congestion. Negative for sinus pain.   Neurological:  Negative for headaches.      Objective:     BP 130/60 (BP Location: Left Arm, Patient Position: Sitting, Cuff Size: Normal)   Pulse 78   Temp 98.1 F (36.7 C) (Oral)   Wt 210 lb 12.8 oz (95.6 kg)   SpO2 97%   BMI 27.07 kg/m  BP Readings from Last 3 Encounters:  10/05/23 130/60  09/22/23 136/80  09/04/23 122/60  Wt Readings from Last 3 Encounters:  10/05/23 210 lb 12.8 oz (95.6 kg)  09/22/23 210 lb 14.4 oz (95.7 kg)  09/04/23 212 lb (96.2 kg)      Physical Exam Vitals reviewed.  Constitutional:      General: He is not in acute distress.    Appearance: He is not ill-appearing.  HENT:     Nose:     Comments: Nasal exam reveals thick whitish colored globules of mucus right and left naris but no purulent secretions.  No visible polyps. Cardiovascular:     Rate and Rhythm: Normal rate and regular rhythm.  Pulmonary:     Effort: Pulmonary effort is normal.     Breath sounds: Normal breath sounds. No wheezing or rales.  Neurological:     Mental Status: He is alert.      No results found for any visits on 10/05/23.    The ASCVD  Risk score (Arnett DK, et al., 2019) failed to calculate for the following reasons:   Risk score cannot be calculated because patient has a medical history suggesting prior/existing ASCVD    Assessment & Plan:   Persistent nasal congestion for one  month.  No evidence for acute sinusitis.  History of perennial and seasonal allergies.  Patient already on Flonase  and Astelin  nasal along with nasal saline.  He apparently has been using some type of over-the-counter solution to thicken nasal mucus and we have encouraged him to leave that off.  Use only plain nasal saline irrigation over the next week or 2 and if symptoms not improving over the next couple weeks be in touch and might consider repeat CT maxillofacial scan versus ENT referral.  Peter Scarlet, MD

## 2023-10-08 ENCOUNTER — Ambulatory Visit (INDEPENDENT_AMBULATORY_CARE_PROVIDER_SITE_OTHER): Admitting: Podiatry

## 2023-10-08 DIAGNOSIS — M25572 Pain in left ankle and joints of left foot: Secondary | ICD-10-CM | POA: Diagnosis not present

## 2023-10-08 MED ORDER — TRIAMCINOLONE ACETONIDE 10 MG/ML IJ SUSP
10.0000 mg | Freq: Once | INTRAMUSCULAR | Status: AC
  Administered 2023-10-08: 10 mg

## 2023-10-08 NOTE — Progress Notes (Signed)
 Presents today with complaint of pain on the dorsal aspect of the foot.  The pain that he had over the calcaneocuboid joint has resolved and that is doing much better.   Physical exam:  General appearance: Pleasant, and in no acute distress. AOx3.  Vascular: Pedal pulses: DP 2/4 bilaterally, PT 2/4 bilaterally.   Neurological: Light touch intact feet bilaterally.  Normal Achilles reflex bilaterally.    Dermatologic:   Skin normal temperature bilaterally.  Skin normal color, tone, and texture bilaterally.   Musculoskeletal: Tenderness to palpation and range of motion of the third tarsometatarsal joint left foot.  No tenderness at the sinus Tarsi or calcaneocuboid joint today.    Diagnosis: Arthralgia third tarsometatarsal joint left  Plan: -Continue wearing good supportive shoes and use topical diclofenac gel as needed -injected 3cc 2:1 mixture 0.5 cc Marcaine:Kenolog 10mg /44ml at third tarsometatarsal joint left.    Return 4 weeks follow-up injection TMT 3 left

## 2023-10-09 ENCOUNTER — Encounter: Payer: Self-pay | Admitting: Family Medicine

## 2023-10-12 ENCOUNTER — Ambulatory Visit (INDEPENDENT_AMBULATORY_CARE_PROVIDER_SITE_OTHER): Admitting: Family Medicine

## 2023-10-12 ENCOUNTER — Encounter: Payer: Self-pay | Admitting: Family Medicine

## 2023-10-12 VITALS — BP 136/70 | HR 72 | Temp 98.3°F | Wt 211.8 lb

## 2023-10-12 DIAGNOSIS — R0981 Nasal congestion: Secondary | ICD-10-CM

## 2023-10-12 MED ORDER — MONTELUKAST SODIUM 10 MG PO TABS: 10.0000 mg | ORAL_TABLET | Freq: Every day | ORAL | 3 refills | Status: DC

## 2023-10-12 NOTE — Telephone Encounter (Signed)
 Noted

## 2023-10-12 NOTE — Progress Notes (Signed)
 Established Patient Office Visit  Subjective   Patient ID: Peter Snyder, male    DOB: 1955-10-29  Age: 68 y.o. MRN: [id number]  Chief Complaint  Patient presents with   Nasal Congestion    HPI   Peter Snyder is here with persistent nasal congestion.  Refer to prior note for details.  He states his nose feels stuffy all day long .  He thinks symptoms possibly exacerbated going from hot to cold.  He tried multiple things including saline nasal irrigation, nasal steroids, and nasal antihistamines regularly without much improvement.  Avoids decongestants with his cardiac history.  Nasal symptoms improved slightly with exercise.  Previous CT maxillofacial 2014 in [place]  showed polypoid nodular areas of thickening left ethmoid and right maxillary sinus region.  Again, denies any purulent nasal secretions or daily headaches.  No fever.  No altered smell.  Past Medical History:  Diagnosis Date   Allergy    Allergy to sulfa drugs 12/10/2020   Anxiety 2/98   Arthritis    CAD (coronary artery disease) 08/2012   Cardiac catheterization 6/27/ 2014 ejection fraction 35-40%, 30% proximal left circumflex, 100% tiny obtuse marginal 1 with collaterals, 50% LAD, 50% D1, 100% RCA with collaterals   Chicken pox    Chronic back pain    Colon polyps    Depression    Diabetes mellitus (HCC)    Diet controlled- OFF metformin  02-01-20   E coli infection 12/10/2020   Folliculitis 12/10/2020   GERD (gastroesophageal reflux disease)    Hyperlipidemia    Hypertension    Kidney stone    Myocardial infarction [facility])    ? year- cath 2014/ june    Past Surgical History:  Procedure Laterality Date   CATARACT EXTRACTION  05/2023   COLONOSCOPY     CRYO INTERCOSTAL NERVE BLOCK     double sacrum nerve block   epidural injections     multiple- L5 S1 and cervical neck pain issues as well    POLYPECTOMY     TONSILLECTOMY  1963   TRIGGER FINGER RELEASE  2019    reports that he quit smoking about 25 years  ago. His smoking use included cigarettes. He started smoking about 53 years ago. He has a 7 pack-year smoking history. He has never used smokeless tobacco. He reports that he does not drink alcohol and does not use drugs. family history includes ADD / ADHD in his brother; Alcohol abuse in his son; Arthritis in his father and paternal grandmother; COPD in his father; Cancer in his mother; Clotting disorder in his paternal grandfather and paternal grandmother; Colon polyps in his father; Early death in his brother; Heart disease in his maternal grandmother; Hyperlipidemia in his maternal grandmother; Hypertension in his mother; Liver cancer in his paternal grandfather; Melanoma in his mother. Allergies  Allergen Reactions   Doxycycline  Hyclate Nausea And Vomiting   Latex Other (See Comments)    Other   Tape     Use Paper Tape    Sulfa Antibiotics Rash    Review of Systems  Constitutional:  Negative for chills and fever.  HENT:  Positive for congestion. Negative for sinus pain.       Objective:     BP 136/70 (BP Location: Left Arm, Patient Position: Sitting, Cuff Size: Normal)   Pulse 72   Temp 98.3 F (36.8 C) (Oral)   Wt 211 lb 12.8 oz (96.1 kg)   SpO2 98%   BMI 27.19 kg/m  BP Readings from Last 3 Encounters:  10/12/23 136/70  10/05/23 130/60  09/22/23 136/80   Wt Readings from Last 3 Encounters:  10/12/23 211 lb 12.8 oz (96.1 kg)  10/05/23 210 lb 12.8 oz (95.6 kg)  09/22/23 210 lb 14.4 oz (95.7 kg)      Physical Exam Vitals reviewed.  Constitutional:      General: He is not in acute distress.    Appearance: Normal appearance. He is not ill-appearing.  Cardiovascular:     Rate and Rhythm: Normal rate and regular rhythm.  Neurological:     Mental Status: He is alert.      No results found for any visits on 10/12/23.    The ASCVD Risk score (Arnett DK, et al., 2019) failed to calculate for the following reasons:   Risk score cannot be calculated because patient  has a medical history suggesting prior/existing ASCVD    Assessment & Plan:   Chronic nasal congestion.  Unrelieved with nasal saline irrigation, nasal steroids, nasal antihistamine.  He avoids decongestants with his cardiac history.  We discussed setting up ENT referral versus repeat CT maxillofacial and he prefers the former.  Referral placed.  Also discussed possible trial of Singulair  10 mg once daily to see if this provides any additional allergy relief.   No follow-ups on file.    [name], MD

## 2023-10-22 NOTE — Progress Notes (Signed)
 HPI: FU coronary artery disease. Previous cardiac care in [place] . Cardiac catheterization performed in June of 2014 showed an ejection fraction of 35-40%. The inferior wall is akinetic. The left main was normal. There was a 30% proximal circumflex, occluded small first obtuse marginal with collaterals, a 50% ramus intermedius, 50% LAD, 50% first diagonal and an occluded right coronary artery with collaterals. Treated medically. Nuclear study January 2017 showed ejection fraction 35%. There was a prior inferior infarct with mild peri-infarct ischemia. Cardiac MRI May 2017 showed akinetic inferior wall with ejection fraction 40%. Biatrial enlargement and mild aortic/mitral regurgitation. Abdominal ultrasound December 2020 showed no aneurysm.  MRA March 2023 showed 4.2 cm ascending aortic aneurysm.  Echocardiogram repeated August 2023 and showed hypokinesis of the basal inferior and inferolateral wall with overall preserved LV function, mild left ventricular hypertrophy, grade 1 diastolic dysfunction, moderate left atrial enlargement, mild mitral regurgitation, trace aortic insufficiency.  MRA of the thoracic aorta March 2024 showed ectasia of the ascending thoracic aorta at 3.9 cm.  Since last seen, patient denies dyspnea, chest pain, palpitations or syncope.  No pedal edema.  Current Outpatient Medications  Medication Sig Dispense Refill   acetaminophen  (TYLENOL ) 500 MG tablet Take 500 mg by mouth every 6 (six) hours as needed for moderate pain.     ALPRAZolam  (XANAX ) 0.25 MG tablet Take 0.25 mg by mouth daily as needed for anxiety.     aspirin  81 MG tablet Take 81 mg by mouth daily.     atorvastatin  (LIPITOR ) 80 MG tablet TAKE 1 TABLET BY MOUTH EVERY DAY 90 tablet 1   Azelastine  HCl 137 MCG/SPRAY SOLN PLACE 1 SPRAY INTO BOTH NOSTRILS 2 TIMES DAILY AS DIRECTED 90 mL 1   carvedilol  (COREG ) 12.5 MG tablet TAKE 1 TABLET TWICE A DAY WITH A MEAL 180 tablet 1   diclofenac Sodium (VOLTAREN) 1 % GEL Apply  2 g topically daily as needed (for pain).     escitalopram  (LEXAPRO ) 20 MG tablet Take 20 mg by mouth at bedtime.     ezetimibe  (ZETIA ) 10 MG tablet TAKE 1 TABLET BY MOUTH EVERY DAY 90 tablet 1   gabapentin  (NEURONTIN ) 600 MG tablet Take 1 tablet (600 mg total) by mouth 2 (two) times daily. 180 tablet 1   LOTEMAX 0.5 % GEL SMARTSIG:1 Drop(s) In Eye(s) Twice Daily PRN     montelukast  (SINGULAIR ) 10 MG tablet Take 1 tablet (10 mg total) by mouth at bedtime. 30 tablet 3   pantoprazole  (PROTONIX ) 40 MG tablet TAKE 1 TABLET BY MOUTH TWICE A DAY 180 tablet 3   pimecrolimus (ELIDEL) 1 % cream 1 Application as needed.     RESTASIS 0.05 % ophthalmic emulsion Place 1 drop into both eyes 2 (two) times daily.      sacubitril -valsartan  (ENTRESTO ) 49-51 MG TAKE 1 TABLET BY MOUTH TWICE A DAY 180 tablet 2   tamsulosin  (FLOMAX ) 0.4 MG CAPS capsule TAKE 1 CAPSULE EVERY DAY AFTER SUPPER 90 capsule 1   triamcinolone  cream (KENALOG ) 0.1 % APPLY TOPICALLY TO THE AFFECTED AREA 2 (TWO) TIMES DAILY AS NEEDED. 60 g 1   ULTRAM  50 MG tablet Take 50 mg by mouth 3 (three) times daily as needed.     zolpidem  (AMBIEN ) 10 MG tablet Take 10 mg by mouth at bedtime as needed for sleep.     No current facility-administered medications for this visit.     Past Medical History:  Diagnosis Date   Allergy    Allergy to sulfa  drugs 12/10/2020   Anxiety 2/98   Arthritis    CAD (coronary artery disease) 08/2012   Cardiac catheterization 6/27/ 2014 ejection fraction 35-40%, 30% proximal left circumflex, 100% tiny obtuse marginal 1 with collaterals, 50% LAD, 50% D1, 100% RCA with collaterals   Chicken pox    Chronic back pain    Colon polyps    Depression    Diabetes mellitus (HCC)    Diet controlled- OFF metformin  02-01-20   E coli infection 12/10/2020   Folliculitis 12/10/2020   GERD (gastroesophageal reflux disease)    Hyperlipidemia    Hypertension    Kidney stone    Myocardial infarction [facility])    ? year- cath 2014/  june     Past Surgical History:  Procedure Laterality Date   CATARACT EXTRACTION  05/2023   COLONOSCOPY     CRYO INTERCOSTAL NERVE BLOCK     double sacrum nerve block   epidural injections     multiple- L5 S1 and cervical neck pain issues as well    POLYPECTOMY     TONSILLECTOMY  1963   TRIGGER FINGER RELEASE  2019    Social History   Socioeconomic History   Marital status: Married    Spouse name: Not on file   Number of children: 3   Years of education: Not on file   Highest education level: Bachelor's degree (e.g., BA, AB, BS)  Occupational History   Occupation: retired    Comment: Retired  Tobacco Use   Smoking status: Former    Current packs/day: 0.00    Average packs/day: 0.3 packs/day for 28.0 years (7.0 ttl pk-yrs)    Types: Cigarettes    Start date: 72    Quit date: 2000    Years since quitting: 25.6   Smokeless tobacco: Never  Vaping Use   Vaping status: Never Used  Substance and Sexual Activity   Alcohol use: No    Alcohol/week: 0.0 standard drinks of alcohol   Drug use: No   Sexual activity: Not on file  Other Topics Concern   Not on file  Social History Narrative   Not on file   Social Drivers of Health   Financial Resource Strain: Low Risk  (08/31/2023)   Overall Financial Resource Strain (CARDIA)    Difficulty of Paying Living Expenses: Not hard at all  Food Insecurity: No Food Insecurity (08/31/2023)   Hunger Vital Sign    Worried About Running Out of Food in the Last Year: Never true    Ran Out of Food in the Last Year: Never true  Transportation Needs: No Transportation Needs (08/31/2023)   PRAPARE - Administrator, Civil Service (Medical): No    Lack of Transportation (Non-Medical): No  Physical Activity: Sufficiently Active (08/31/2023)   Exercise Vital Sign    Days of Exercise per Week: 5 days    Minutes of Exercise per Session: 60 min  Stress: No Stress Concern Present (08/31/2023)   [employer] of Occupational  Health - Occupational Stress Questionnaire    Feeling of Stress: Not at all  Social Connections: Socially Integrated (08/31/2023)   Social Connection and Isolation Panel    Frequency of Communication with Friends and Family: More than three times a week    Frequency of Social Gatherings with Friends and Family: More than three times a week    Attends Religious Services: 1 to 4 times per year    Active Member of Clubs or Organizations: Yes    Attends  Club or Organization Meetings: More than 4 times per year    Marital Status: Married  Catering manager Violence: Not At Risk (06/17/2023)   Humiliation, Afraid, Rape, and Kick questionnaire    Fear of Current or Ex-Partner: No    Emotionally Abused: No    Physically Abused: No    Sexually Abused: No    Family History  Problem Relation Age of Onset   Melanoma Mother        metastatic   Cancer Mother        melanoma   Hypertension Mother    Arthritis Father    Colon polyps Father    COPD Father    Hyperlipidemia Maternal Grandmother    Heart disease Maternal Grandmother    Arthritis Paternal Grandmother    Clotting disorder Paternal Grandmother    Clotting disorder Paternal Grandfather    Liver cancer Paternal Grandfather    ADD / ADHD Brother    Early death Brother    Alcohol abuse Son    Colon cancer Neg Hx    Esophageal cancer Neg Hx    Rectal cancer Neg Hx    Stomach cancer Neg Hx     ROS: no fevers or chills, productive cough, hemoptysis, dysphasia, odynophagia, melena, hematochezia, dysuria, hematuria, rash, seizure activity, orthopnea, PND, pedal edema, claudication. Remaining systems are negative.  Physical Exam: Well-developed well-nourished in no acute distress.  Skin is warm and dry.  HEENT is normal.  Neck is supple.  Chest is clear to auscultation with normal expansion.  Cardiovascular exam is regular rate and rhythm.  Abdominal exam nontender or distended. No masses palpated. Extremities show no edema. neuro  grossly intact  EKG Interpretation Date/Time:  Friday October 30 2023 08:41:30 EDT Ventricular Rate:  69 PR Interval:  208 QRS Duration:  124 QT Interval:  388 QTC Calculation: 415 R Axis:   -19  Text Interpretation: Normal sinus rhythm Right bundle branch block Inferior infarct Confirmed by Pietro Rogue ([id number]) on 10/30/2023 8:44:00 AM    A/P  1 coronary artery disease-patient doing well with no chest pain.  Continue with aspirin  and statin.  2 ischemic cardiomyopathy-continue Entresto  and carvedilol .  Add Jardiance  10 mg daily.  Check potassium and renal function in 1 week.  Will plan repeat echocardiogram to reassess LV function.  If reduced will add low-dose spironolactone.  3 hypertension-blood pressure is controlled.  Continue present medical regimen.  4 hyperlipidemia-continue Lipitor  and Zetia .  Check lipids and liver.  Goal LDL less than 55.  If not at goal we will consider PCSK9 inhibitor.  5 history of dilated aortic root-ectasia noted on most recent MRA.  Rogue Pietro, MD

## 2023-10-23 ENCOUNTER — Ambulatory Visit: Admitting: Podiatry

## 2023-10-26 ENCOUNTER — Other Ambulatory Visit (INDEPENDENT_AMBULATORY_CARE_PROVIDER_SITE_OTHER)

## 2023-10-26 ENCOUNTER — Ambulatory Visit: Payer: Self-pay | Admitting: Family Medicine

## 2023-10-26 DIAGNOSIS — E1165 Type 2 diabetes mellitus with hyperglycemia: Secondary | ICD-10-CM | POA: Diagnosis not present

## 2023-10-26 DIAGNOSIS — Z125 Encounter for screening for malignant neoplasm of prostate: Secondary | ICD-10-CM

## 2023-10-26 LAB — MICROALBUMIN / CREATININE URINE RATIO
Creatinine,U: 76.8 mg/dL
Microalb Creat Ratio: 15.9 mg/g (ref 0.0–30.0)
Microalb, Ur: 1.2 mg/dL (ref 0.0–1.9)

## 2023-10-26 LAB — PSA, MEDICARE: PSA: 2.36 ng/mL (ref 0.10–4.00)

## 2023-10-27 ENCOUNTER — Ambulatory Visit: Admitting: Family Medicine

## 2023-10-27 ENCOUNTER — Encounter: Payer: Self-pay | Admitting: Family Medicine

## 2023-10-27 VITALS — BP 120/68 | HR 71 | Temp 97.8°F | Wt 213.0 lb

## 2023-10-27 DIAGNOSIS — E119 Type 2 diabetes mellitus without complications: Secondary | ICD-10-CM | POA: Diagnosis not present

## 2023-10-27 DIAGNOSIS — E1165 Type 2 diabetes mellitus with hyperglycemia: Secondary | ICD-10-CM

## 2023-10-27 DIAGNOSIS — N400 Enlarged prostate without lower urinary tract symptoms: Secondary | ICD-10-CM

## 2023-10-27 MED ORDER — GABAPENTIN 600 MG PO TABS: 600.0000 mg | ORAL_TABLET | Freq: Two times a day (BID) | ORAL | 1 refills | Status: AC

## 2023-10-27 NOTE — Progress Notes (Signed)
 Established Patient Office Visit  Subjective   Patient ID: Peter Snyder, male    DOB: Nov 23, 1955  Age: 68 y.o. MRN: [id number]  Chief Complaint  Patient presents with   Medical Management of Chronic Issues    HPI   Peter Snyder is here for chronic medical follow-up.  He had a couple recent labs and wished to discuss those.  Several months ago he had PSA of 2.9 which had increased from 1.53 a year prior.  His repeat PSA yesterday came back 2.36 which is reassuring.  He does have history of known BPH and takes tamsulosin .  Still has occasional slow stream.  5 months ago had microalbumin creatinine ratio 26 and was concerned about that.  We repeated this yesterday and came back 15.9.  His blood sugars have consistently been well-controlled.  Last A1c was 6.4% and this in spite of multiple orthopedic steroid injections.  He is very diligent regarding watching his diet.  Generally eats a very clean healthy diet  Still exercising regularly.  No recent chest pains.  He did have recent eye exam and apparently had some microhemorrhages.  This was somewhat surprising given his excellent blood pressure and glycemic control.  Past Medical History:  Diagnosis Date   Allergy    Allergy to sulfa drugs 12/10/2020   Anxiety 2/98   Arthritis    CAD (coronary artery disease) 08/2012   Cardiac catheterization 6/27/ 2014 ejection fraction 35-40%, 30% proximal left circumflex, 100% tiny obtuse marginal 1 with collaterals, 50% LAD, 50% D1, 100% RCA with collaterals   Chicken pox    Chronic back pain    Colon polyps    Depression    Diabetes mellitus (HCC)    Diet controlled- OFF metformin  02-01-20   E coli infection 12/10/2020   Folliculitis 12/10/2020   GERD (gastroesophageal reflux disease)    Hyperlipidemia    Hypertension    Kidney stone    Myocardial infarction [facility])    ? year- cath 2014/ june    Past Surgical History:  Procedure Laterality Date   CATARACT EXTRACTION  05/2023   COLONOSCOPY      CRYO INTERCOSTAL NERVE BLOCK     double sacrum nerve block   epidural injections     multiple- L5 S1 and cervical neck pain issues as well    POLYPECTOMY     TONSILLECTOMY  1963   TRIGGER FINGER RELEASE  2019    reports that he quit smoking about 25 years ago. His smoking use included cigarettes. He started smoking about 53 years ago. He has a 7 pack-year smoking history. He has never used smokeless tobacco. He reports that he does not drink alcohol and does not use drugs. family history includes ADD / ADHD in his brother; Alcohol abuse in his son; Arthritis in his father and paternal grandmother; COPD in his father; Cancer in his mother; Clotting disorder in his paternal grandfather and paternal grandmother; Colon polyps in his father; Early death in his brother; Heart disease in his maternal grandmother; Hyperlipidemia in his maternal grandmother; Hypertension in his mother; Liver cancer in his paternal grandfather; Melanoma in his mother. Allergies  Allergen Reactions   Doxycycline  Hyclate Nausea And Vomiting   Latex Other (See Comments)    Other   Tape     Use Paper Tape    Sulfa Antibiotics Rash     Review of Systems  Constitutional:  Negative for malaise/fatigue.  Eyes:  Negative for blurred vision.  Respiratory:  Negative for shortness  of breath.   Cardiovascular:  Negative for chest pain.  Neurological:  Negative for dizziness, weakness and headaches.      Objective:     BP 120/68   Pulse 71   Temp 97.8 F (36.6 C) (Oral)   Wt 213 lb (96.6 kg)   SpO2 97%   BMI 27.35 kg/m  BP Readings from Last 3 Encounters:  10/27/23 120/68  10/12/23 136/70  10/05/23 130/60   Wt Readings from Last 3 Encounters:  10/27/23 213 lb (96.6 kg)  10/12/23 211 lb 12.8 oz (96.1 kg)  10/05/23 210 lb 12.8 oz (95.6 kg)      Physical Exam Vitals reviewed.  Constitutional:      Appearance: He is well-developed.  Eyes:     Pupils: Pupils are equal, round, and reactive to light.   Neck:     Thyroid : No thyromegaly.  Cardiovascular:     Rate and Rhythm: Normal rate and regular rhythm.  Pulmonary:     Effort: Pulmonary effort is normal. No respiratory distress.     Breath sounds: Normal breath sounds. No wheezing or rales.  Musculoskeletal:     Cervical back: Neck supple.  Neurological:     Mental Status: He is alert and oriented to person, place, and time.      No results found for any visits on 10/27/23.    The ASCVD Risk score (Arnett DK, et al., 2019) failed to calculate for the following reasons:   Risk score cannot be calculated because patient has a medical history suggesting prior/existing ASCVD    Assessment & Plan:   #1 BPH history.  He had recent bump in PSA from 1.5-2.9 but repeat yesterday did back down to 2.3 which is reassuring.  Continue tamsulosin .  Continue to avoid medications that could exacerbate BPH symptoms such as anticholinergics  #2 type 2 diabetes controlled with recent A1c 6.4%.  Recent urine microalbumin screen normal.  Set up 75-month follow-up and repeat labs at that point including A1c  [name], MD

## 2023-10-30 ENCOUNTER — Ambulatory Visit: Attending: Cardiology | Admitting: Cardiology

## 2023-10-30 ENCOUNTER — Encounter: Payer: Self-pay | Admitting: Cardiology

## 2023-10-30 ENCOUNTER — Ambulatory Visit (INDEPENDENT_AMBULATORY_CARE_PROVIDER_SITE_OTHER): Admitting: Podiatry

## 2023-10-30 VITALS — BP 120/68 | HR 69 | Ht 74.0 in | Wt 212.0 lb

## 2023-10-30 DIAGNOSIS — I2583 Coronary atherosclerosis due to lipid rich plaque: Secondary | ICD-10-CM | POA: Insufficient documentation

## 2023-10-30 DIAGNOSIS — I255 Ischemic cardiomyopathy: Secondary | ICD-10-CM | POA: Insufficient documentation

## 2023-10-30 DIAGNOSIS — E785 Hyperlipidemia, unspecified: Secondary | ICD-10-CM | POA: Diagnosis present

## 2023-10-30 DIAGNOSIS — I10 Essential (primary) hypertension: Secondary | ICD-10-CM | POA: Insufficient documentation

## 2023-10-30 DIAGNOSIS — I7121 Aneurysm of the ascending aorta, without rupture: Secondary | ICD-10-CM | POA: Diagnosis present

## 2023-10-30 DIAGNOSIS — M26622 Arthralgia of left temporomandibular joint: Secondary | ICD-10-CM

## 2023-10-30 DIAGNOSIS — M25572 Pain in left ankle and joints of left foot: Secondary | ICD-10-CM

## 2023-10-30 DIAGNOSIS — I2510 Atherosclerotic heart disease of native coronary artery without angina pectoris: Secondary | ICD-10-CM | POA: Diagnosis present

## 2023-10-30 MED ORDER — EMPAGLIFLOZIN 10 MG PO TABS
10.0000 mg | ORAL_TABLET | Freq: Every day | ORAL | 6 refills | Status: AC
Start: 2023-10-30 — End: ?

## 2023-10-30 MED ORDER — TRIAMCINOLONE ACETONIDE 10 MG/ML IJ SUSP
10.0000 mg | Freq: Once | INTRAMUSCULAR | Status: AC
  Administered 2023-10-30: 10 mg

## 2023-10-30 NOTE — Progress Notes (Signed)
 Patient presents today with complaint of pain on the left foot indicates over the area sinus tarsi and slightly distally.  Has not noticed any redness or swelling in the area.  Does not recall any injury.  He is still walking a lot.   Physical exam:  General appearance: Pleasant, and in no acute distress. AOx3.  Vascular: Pedal pulses: DP 2/4 bilaterally, PT 2/4 bilaterally. Mild edema lower legs bilaterally. Capillary fill time immediate b/l.  Neurological: Grossly intact bilaterally.  Negative Mulder sign's bilaterally.  Dermatologic:   Skin normal temperature bilaterally.  Skin normal color, tone, and texture bilaterally.   Musculoskeletal: Tenderness of the sinus tarsi left.  Tenderness with range of motion subtalar joint left.  Some tenderness at the calcaneocuboid joint dorsally left.    Diagnosis: 1.  Arthralgia subtalar joint left.  Plan: -Recommend icing 20 minutes 3 or 4 times a day.  Wear good stable shoe.  Can use Voltaren gel over the area 3 or 4 times a day also. -injected 3cc 2:1 mixture 0.5 cc Marcaine:Kenolog 10mg /9ml at subtalar joint sinus tarsi left.    Return 2 weeks follow-up injection left

## 2023-10-30 NOTE — Patient Instructions (Signed)
 Medication Instructions:   START JARDIANCE  10 MG ONCE DAILY  *If you need a refill on your cardiac medications before your next appointment, please call your pharmacy*  Lab Work:  Your physician recommends that you return for lab work in: [facility] If you have labs (blood work) drawn today and your tests are completely normal, you will receive your results only by: MyChart Message (if you have MyChart) OR A paper copy in the mail If you have any lab test that is abnormal or we need to change your treatment, we will call you to review the results.  Testing/Procedures:  Your physician has requested that you have an echocardiogram. Echocardiography is a painless test that uses sound waves to create images of your heart. It provides your doctor with information about the size and shape of your heart and how well your heart's chambers and valves are working. This procedure takes approximately one hour. There are no restrictions for this procedure. Please do NOT wear cologne, perfume, aftershave, or lotions (deodorant is allowed). Please arrive 15 minutes prior to your appointment time.  Please note: We ask at that you not bring children with you during ultrasound (echo/ vascular) testing. Due to room size and safety concerns, children are not allowed in the ultrasound rooms during exams. Our front office staff cannot provide observation of children in our lobby area while testing is being conducted. An adult accompanying a patient to their appointment will only be allowed in the ultrasound room at the discretion of the ultrasound technician under special circumstances. We apologize for any inconvenience. [address]  Follow-Up: At [facility], you and your health needs are our priority.  As part of our continuing mission to provide you with exceptional heart care, our providers are all part of one team.  This team includes your primary Cardiologist (physician) and Advanced  Practice Providers or APPs (Physician Assistants and Nurse Practitioners) who all work together to provide you with the care you need, when you need it.  Your next appointment:   12 month(s)  Provider:   REDELL SHALLOW MD

## 2023-11-07 ENCOUNTER — Ambulatory Visit: Payer: Self-pay | Admitting: Cardiology

## 2023-11-07 DIAGNOSIS — E785 Hyperlipidemia, unspecified: Secondary | ICD-10-CM

## 2023-11-07 LAB — COMPREHENSIVE METABOLIC PANEL WITH GFR
ALT: 17 IU/L (ref 0–44)
AST: 19 IU/L (ref 0–40)
Albumin: 4.3 g/dL (ref 3.9–4.9)
Alkaline Phosphatase: 75 IU/L (ref 44–121)
BUN/Creatinine Ratio: 19 (ref 10–24)
BUN: 20 mg/dL (ref 8–27)
Bilirubin Total: <0.2 mg/dL (ref 0.0–1.2)
CO2: 20 mmol/L (ref 20–29)
Calcium: 8.9 mg/dL (ref 8.6–10.2)
Chloride: 105 mmol/L (ref 96–106)
Creatinine, Ser: 1.03 mg/dL (ref 0.76–1.27)
Globulin, Total: 1.9 g/dL (ref 1.5–4.5)
Glucose: 97 mg/dL (ref 70–99)
Potassium: 4.1 mmol/L (ref 3.5–5.2)
Sodium: 145 mmol/L — ABNORMAL HIGH (ref 134–144)
Total Protein: 6.2 g/dL (ref 6.0–8.5)
eGFR: 79 mL/min/1.73 (ref >59)

## 2023-11-07 LAB — LIPID PANEL
Chol/HDL Ratio: 3.4 ratio (ref 0.0–5.0)
Cholesterol, Total: 129 mg/dL (ref 100–199)
HDL: 38 mg/dL — ABNORMAL LOW (ref >39)
LDL Chol Calc (NIH): 74 mg/dL (ref 0–99)
Triglycerides: 90 mg/dL (ref 0–149)
VLDL Cholesterol Cal: 17 mg/dL (ref 5–40)

## 2023-11-12 ENCOUNTER — Encounter: Payer: Self-pay | Admitting: Podiatry

## 2023-11-12 ENCOUNTER — Ambulatory Visit (INDEPENDENT_AMBULATORY_CARE_PROVIDER_SITE_OTHER): Admitting: Podiatry

## 2023-11-12 DIAGNOSIS — M25572 Pain in left ankle and joints of left foot: Secondary | ICD-10-CM

## 2023-11-12 MED ORDER — TRIAMCINOLONE ACETONIDE 10 MG/ML IJ SUSP
10.0000 mg | Freq: Once | INTRAMUSCULAR | Status: AC
  Administered 2023-11-12: 10 mg

## 2023-11-12 NOTE — Progress Notes (Signed)
 Patient presents today complaining of pain over the lateral aspect of the foot.  Aware he points where maximum tenderness is of the fifth tarsal metatarsal  joint and along the distal peroneus brevis tendon.  Has not noticed any redness or ecchymosis.   Physical exam:  General appearance: Pleasant, and in no acute distress. AOx3.  Vascular: Pedal pulses: DP 2/4 bilaterally, PT 2/4 bilaterally. Minimal edema lower legs bilaterally. Capillary fill time immediate bilaterally.  Neurological: Negative Mulder sign feet bilaterally  Dermatologic:   Skin normal temperature bilaterally.  Skin normal color, tone, and texture bilaterally.   Musculoskeletal: Tenderness at the fifth tarsometatarsal joint with palpation and range of motion of the left.  Some tenderness along the peroneus brevis tendon near the tubercle and at the base of the fifth metatarsal.    Diagnosis: 1.  Arthralgia fifth tarsometatarsal joint left.  Plan: -Continue good supportive shoe bilaterally. -injected 3cc 2:1 mixture 0.5 cc Marcaine:Kenolog 10mg /52ml at fifth tarsometatarsal joint left.     Return 4 weeks follow-up injection left

## 2023-11-13 ENCOUNTER — Other Ambulatory Visit (HOSPITAL_COMMUNITY): Payer: Self-pay

## 2023-11-13 ENCOUNTER — Ambulatory Visit (HOSPITAL_COMMUNITY)
Admission: RE | Admit: 2023-11-13 | Discharge: 2023-11-13 | Disposition: A | Discharge status: Home / Self Care | Source: Ambulatory Visit | Attending: Cardiology | Admitting: Cardiology

## 2023-11-13 DIAGNOSIS — I255 Ischemic cardiomyopathy: Secondary | ICD-10-CM | POA: Insufficient documentation

## 2023-11-13 LAB — ECHOCARDIOGRAM COMPLETE
Area-P 1/2: 4.8 cm2
S' Lateral: 2.87 cm

## 2023-11-13 NOTE — Addendum Note (Signed)
 Addended by: LORRENE FEDERICO CROME on: 11/13/2023 05:06 PM   Modules accepted: Orders

## 2023-11-17 ENCOUNTER — Institutional Professional Consult (permissible substitution) (INDEPENDENT_AMBULATORY_CARE_PROVIDER_SITE_OTHER)

## 2023-11-18 ENCOUNTER — Ambulatory Visit (INDEPENDENT_AMBULATORY_CARE_PROVIDER_SITE_OTHER): Admitting: Family Medicine

## 2023-11-18 ENCOUNTER — Encounter (INDEPENDENT_AMBULATORY_CARE_PROVIDER_SITE_OTHER): Payer: Self-pay

## 2023-11-18 ENCOUNTER — Ambulatory Visit (INDEPENDENT_AMBULATORY_CARE_PROVIDER_SITE_OTHER)

## 2023-11-18 VITALS — BP 104/64 | HR 68

## 2023-11-18 VITALS — BP 134/70 | HR 72 | Temp 97.7°F | Wt 212.4 lb

## 2023-11-18 DIAGNOSIS — J3489 Other specified disorders of nose and nasal sinuses: Secondary | ICD-10-CM | POA: Diagnosis not present

## 2023-11-18 DIAGNOSIS — R0981 Nasal congestion: Secondary | ICD-10-CM | POA: Diagnosis not present

## 2023-11-18 DIAGNOSIS — J309 Allergic rhinitis, unspecified: Secondary | ICD-10-CM

## 2023-11-18 DIAGNOSIS — J342 Deviated nasal septum: Secondary | ICD-10-CM

## 2023-11-18 DIAGNOSIS — R300 Dysuria: Secondary | ICD-10-CM

## 2023-11-18 LAB — POC URINALSYSI DIPSTICK (AUTOMATED)
Bilirubin, UA: NEGATIVE
Blood, UA: NEGATIVE
Glucose, UA: POSITIVE — AB
Ketones, UA: NEGATIVE
Leukocytes, UA: NEGATIVE
Nitrite, UA: NEGATIVE
Protein, UA: NEGATIVE
Spec Grav, UA: 1.015 (ref 1.010–1.025)
Urobilinogen, UA: 0.2 U/dL
pH, UA: 6 (ref 5.0–8.0)

## 2023-11-18 MED ORDER — LEVOCETIRIZINE DIHYDROCHLORIDE 5 MG PO TABS: 2.5000 mg | ORAL_TABLET | Freq: Every evening | ORAL | 6 refills | Status: AC

## 2023-11-18 MED ORDER — FLUTICASONE PROPIONATE 50 MCG/ACT NA SUSP: 2.0000 | Freq: Two times a day (BID) | NASAL | 6 refills | Status: DC

## 2023-11-18 NOTE — Progress Notes (Signed)
 Dear Dr. Micheal, Here is my assessment for our mutual patient, Peter Snyder. Thank you for allowing me the opportunity to care for your patient. Please do not hesitate to contact me should you have any other questions. Sincerely, Dr. Penne Croak  Otolaryngology Clinic Note Referring provider: Dr. Micheal HPI:  Peter Snyder is a 68 y.o. male kindly referred by Dr. Micheal for evaluation of nasal congestion.  Patient has had a long history of allergic rhinitis and was previously established with local ENT. He has been on flonase  for many years. Recently added azelastine  and singulair  with some relief. He reports that his whole airway is noticeably more congested if he misses a dose of singulair . He administers one nasal spray in the am and one in the PM. No recent abx or imaging study. No recent allergy testing.   H&N Surgery: Never sinus or nasal surgery Personal or FHx of bleeding dz or anesthesia difficulty: no   Independent Review of Additional Tests or Records:  CT angio head from 1 year 10 months ago independently reviewed and interpreted by me demonstrated clear nasal sinuses without mucosal thickening. Evidence of septal swell body and inferior turbinate hypertrophy.    PMH/Meds/All/SocHx/FamHx/ROS:   Past Medical History:  Diagnosis Date   Allergy    Allergy to sulfa drugs 12/10/2020   Anxiety 2/98   Arthritis    CAD (coronary artery disease) 08/2012   Cardiac catheterization 6/27/ 2014 ejection fraction 35-40%, 30% proximal left circumflex, 100% tiny obtuse marginal 1 with collaterals, 50% LAD, 50% D1, 100% RCA with collaterals   Chicken pox    Chronic back pain    Colon polyps    Depression    Diabetes mellitus (HCC)    Diet controlled- OFF metformin  02-01-20   E coli infection 12/10/2020   Folliculitis 12/10/2020   GERD (gastroesophageal reflux disease)    Hyperlipidemia    Hypertension    Kidney stone    Myocardial infarction [facility])    ? year- cath 2014/  june      Past Surgical History:  Procedure Laterality Date   CATARACT EXTRACTION  05/2023   COLONOSCOPY     CRYO INTERCOSTAL NERVE BLOCK     double sacrum nerve block   epidural injections     multiple- L5 S1 and cervical neck pain issues as well    POLYPECTOMY     TONSILLECTOMY  1963   TRIGGER FINGER RELEASE  2019    Family History  Problem Relation Age of Onset   Melanoma Mother        metastatic   Cancer Mother        melanoma   Hypertension Mother    Arthritis Father    Colon polyps Father    COPD Father    Hyperlipidemia Maternal Grandmother    Heart disease Maternal Grandmother    Arthritis Paternal Grandmother    Clotting disorder Paternal Grandmother    Clotting disorder Paternal Grandfather    Liver cancer Paternal Grandfather    ADD / ADHD Brother    Early death Brother    Alcohol abuse Son    Colon cancer Neg Hx    Esophageal cancer Neg Hx    Rectal cancer Neg Hx    Stomach cancer Neg Hx      Social Connections: Socially Integrated (08/31/2023)   Social Connection and Isolation Panel    Frequency of Communication with Friends and Family: More than three times a week    Frequency of Social Gatherings  with Friends and Family: More than three times a week    Attends Religious Services: 1 to 4 times per year    Active Member of Clubs or Organizations: Yes    Attends Engineer, structural: More than 4 times per year    Marital Status: Married      Current Outpatient Medications:    acetaminophen  (TYLENOL ) 500 MG tablet, Take 500 mg by mouth every 6 (six) hours as needed for moderate pain., Disp: , Rfl:    ALPRAZolam  (XANAX ) 0.25 MG tablet, Take 0.25 mg by mouth daily as needed for anxiety., Disp: , Rfl:    aspirin  81 MG tablet, Take 81 mg by mouth daily., Disp: , Rfl:    atorvastatin  (LIPITOR ) 80 MG tablet, TAKE 1 TABLET BY MOUTH EVERY DAY, Disp: 90 tablet, Rfl: 1   Azelastine  HCl 137 MCG/SPRAY SOLN, PLACE 1 SPRAY INTO BOTH NOSTRILS 2 TIMES DAILY  AS DIRECTED, Disp: 90 mL, Rfl: 1   carvedilol  (COREG ) 12.5 MG tablet, TAKE 1 TABLET TWICE A DAY WITH A MEAL, Disp: 180 tablet, Rfl: 1   diclofenac Sodium (VOLTAREN) 1 % GEL, Apply 2 g topically daily as needed (for pain)., Disp: , Rfl:    empagliflozin  (JARDIANCE ) 10 MG TABS tablet, Take 1 tablet (10 mg total) by mouth daily before breakfast., Disp: 30 tablet, Rfl: 6   escitalopram  (LEXAPRO ) 20 MG tablet, Take 20 mg by mouth at bedtime., Disp: , Rfl:    ezetimibe  (ZETIA ) 10 MG tablet, TAKE 1 TABLET BY MOUTH EVERY DAY, Disp: 90 tablet, Rfl: 1   fluticasone  (FLONASE ) 50 MCG/ACT nasal spray, Place 2 sprays into both nostrils in the morning and at bedtime., Disp: 16 g, Rfl: 6   gabapentin  (NEURONTIN ) 600 MG tablet, Take 1 tablet (600 mg total) by mouth 2 (two) times daily., Disp: 180 tablet, Rfl: 1   levocetirizine (XYZAL ) 5 MG tablet, Take 0.5 tablets (2.5 mg total) by mouth every evening., Disp: 30 tablet, Rfl: 6   LOTEMAX 0.5 % GEL, SMARTSIG:1 Drop(s) In Eye(s) Twice Daily PRN, Disp: , Rfl:    montelukast  (SINGULAIR ) 10 MG tablet, Take 1 tablet (10 mg total) by mouth at bedtime., Disp: 30 tablet, Rfl: 3   pantoprazole  (PROTONIX ) 40 MG tablet, TAKE 1 TABLET BY MOUTH TWICE A DAY, Disp: 180 tablet, Rfl: 3   pimecrolimus (ELIDEL) 1 % cream, 1 Application as needed., Disp: , Rfl:    RESTASIS 0.05 % ophthalmic emulsion, Place 1 drop into both eyes 2 (two) times daily. , Disp: , Rfl:    sacubitril -valsartan  (ENTRESTO ) 49-51 MG, TAKE 1 TABLET BY MOUTH TWICE A DAY, Disp: 180 tablet, Rfl: 2   tamsulosin  (FLOMAX ) 0.4 MG CAPS capsule, TAKE 1 CAPSULE EVERY DAY AFTER SUPPER, Disp: 90 capsule, Rfl: 1   triamcinolone  cream (KENALOG ) 0.1 %, APPLY TOPICALLY TO THE AFFECTED AREA 2 (TWO) TIMES DAILY AS NEEDED., Disp: 60 g, Rfl: 1   ULTRAM  50 MG tablet, Take 50 mg by mouth 3 (three) times daily as needed., Disp: , Rfl:    zolpidem  (AMBIEN ) 10 MG tablet, Take 10 mg by mouth at bedtime as needed for sleep., Disp: , Rfl:     Physical Exam:   BP 104/64   Pulse 68   SpO2 97%   Salient findings:  CN II-XII intact  Bilateral EAC clear and TM intact with well pneumatized middle ear spaces Anterior rhinoscopy: Septum deviated left; bilateral inferior turbinates with 3+ hypertrophy, boggy, crusting present.  No lesions of oral cavity/oropharynx;  dentition  No obviously palpable neck masses/lymphadenopathy/thyromegaly No respiratory distress or stridor  Seprately Identifiable Procedures:  Given the patient's symptoms and incomplete visualization of critical sinonasal areas with anterior rhinoscopy, a separately performed diagnostic nasal endoscopy procedure is indicated for a complete rhinologic evaluation per American Rhinologic Society recommendations (https://[email or web address]-rhinologic.org/position-statements)  I personally ordered, reviewed and interpreted the following with the patient today  Procedure Note Diagnostic Nasal Endoscopy CPT CODE -- 68768 - Mod 25  Prior to initiating any procedures, risks/benefits/alternatives were explained to the patient and verbal consent obtained.  Pre-procedure diagnosis: Concern for polyps, mass, sinusitis Post-procedure diagnosis: same Indication: See pre-procedure diagnosis and physical exam above Complications: None apparent EBL: 0 mL Anesthesia: Lidocaine  4% and topical decongestant was topically sprayed in each nasal cavity  Description of Procedure:  Patient was identified. A flexible fiberoptic endoscope was utilized to evaluate the sinonasal cavities, mucosa, sinus ostia and turbinates and septum.  Overall, signs of mucosal inflammation are noted.  Also noted are crusting, and significantly boggy inferior turbinates. Mildly deviated nasal septum - S shape.  No mucopurulence, polyps, or masses noted.   Right Middle meatus: congested Right SE Recess: clear, scarring over former location of adenoid pad Left MM: congested Left SE Recess: clear, scarring over  former location of adenoid pad Photodocumentation was obtained.   Impression & Plans:  Peter Snyder is a 68 y.o. male with chronic allergic rhinitis nasal congestion.  1. Chronic allergic rhinitis   2. DNS (deviated nasal septum)   3. Deviation of inferior turbinate   4. Chronic nasal congestion     Plan - today's findings were discussed in detail with the patient - today's diagnoses were discussed in detail with the patient.  - Discussed medical and surgical options. Plan to maximize medical therapy by increasing Flonase  to 2 sprays twice a day, increasing azelastine  2 sprays twice a day, adding Xyzal , and adding Ocean nasal spray at noon. - Patient will follow-up in 6 weeks to reevaluate efficacy versus discussing inferior turbinate reduction and septoplasty - we discussed imaging of the sinuses and an antibiotic for a sinus infection. He declined as he does not have facial pain, nasal drainage, or decreased sense of smell. I think this is appropriate given his symptoms and there was no purulence on my nasal endoscopy.   See below regarding exact medications prescribed this encounter including dosages and route: Meds ordered this encounter  Medications   levocetirizine (XYZAL ) 5 MG tablet    Sig: Take 0.5 tablets (2.5 mg total) by mouth every evening.    Dispense:  30 tablet    Refill:  6   fluticasone  (FLONASE ) 50 MCG/ACT nasal spray    Sig: Place 2 sprays into both nostrils in the morning and at bedtime.    Dispense:  16 g    Refill:  6    Thank you for allowing me the opportunity to care for your patient. Please do not hesitate to contact me should you have any other questions.  Sincerely, Penne Croak, DO Otolaryngologist (ENT) [facility] Phone: [phone] Fax: [phone]  11/18/2023, 3:45 PM   MDM:  Level 99204 Complexity/Problems addressed: mod Data complexity: mod independent review of cta - Morbidity: mod  - Prescription Drug prescribed  or managed: sent

## 2023-11-18 NOTE — Patient Instructions (Signed)
 Let's plan on 3 month follow up to reassess A1C    No signs of infection from urine today.

## 2023-11-18 NOTE — Addendum Note (Signed)
 Addended by: MICHEAL WOLM ORN on: 11/18/2023 10:22 AM   Modules accepted: Level of Service

## 2023-11-18 NOTE — Progress Notes (Signed)
 Established Patient Office Visit  Subjective   Patient ID: Peter Snyder, male    DOB: 1955/06/05  Age: 68 y.o. MRN: [id number]  Chief Complaint  Patient presents with   Urinary Tract Infection    HPI   Jaquis is seen with abnormal urine odor over the past several days.  He states little over 2 weeks ago he was started per cardiology on Jardiance .  He does have history of controlled diabetes with most recent A1c 6.4%.  His last echo showed EF around 55%.  He denies any burning with urination.  No fevers or chills.  No flank pain.  No nausea or vomiting.  He also plans to start Repatha soon.  He is already on 80 mg atorvastatin  and Zetia  and LDL was not to goal.  Goal LDL less than 55  Past Medical History:  Diagnosis Date   Allergy    Allergy to sulfa drugs 12/10/2020   Anxiety 2/98   Arthritis    CAD (coronary artery disease) 08/2012   Cardiac catheterization 6/27/ 2014 ejection fraction 35-40%, 30% proximal left circumflex, 100% tiny obtuse marginal 1 with collaterals, 50% LAD, 50% D1, 100% RCA with collaterals   Chicken pox    Chronic back pain    Colon polyps    Depression    Diabetes mellitus (HCC)    Diet controlled- OFF metformin  02-01-20   E coli infection 12/10/2020   Folliculitis 12/10/2020   GERD (gastroesophageal reflux disease)    Hyperlipidemia    Hypertension    Kidney stone    Myocardial infarction [facility])    ? year- cath 2014/ june    Past Surgical History:  Procedure Laterality Date   CATARACT EXTRACTION  05/2023   COLONOSCOPY     CRYO INTERCOSTAL NERVE BLOCK     double sacrum nerve block   epidural injections     multiple- L5 S1 and cervical neck pain issues as well    POLYPECTOMY     TONSILLECTOMY  1963   TRIGGER FINGER RELEASE  2019    reports that he quit smoking about 25 years ago. His smoking use included cigarettes. He started smoking about 53 years ago. He has a 7 pack-year smoking history. He has never used smokeless tobacco. He reports  that he does not drink alcohol and does not use drugs. family history includes ADD / ADHD in his brother; Alcohol abuse in his son; Arthritis in his father and paternal grandmother; COPD in his father; Cancer in his mother; Clotting disorder in his paternal grandfather and paternal grandmother; Colon polyps in his father; Early death in his brother; Heart disease in his maternal grandmother; Hyperlipidemia in his maternal grandmother; Hypertension in his mother; Liver cancer in his paternal grandfather; Melanoma in his mother. Allergies  Allergen Reactions   Doxycycline  Hyclate Nausea And Vomiting   Latex Other (See Comments)    Other   Tape     Use Paper Tape    Sulfa Antibiotics Rash    Review of Systems  Constitutional:  Negative for chills and fever.  Genitourinary:  Negative for flank pain, hematuria and urgency.      Objective:     BP 134/70   Pulse 72   Temp 97.7 F (36.5 C) (Oral)   Wt 212 lb 6.4 oz (96.3 kg)   SpO2 96%   BMI 27.27 kg/m  BP Readings from Last 3 Encounters:  11/18/23 134/70  10/30/23 120/68  10/27/23 120/68   Wt Readings from Last 3  Encounters:  11/18/23 212 lb 6.4 oz (96.3 kg)  10/30/23 212 lb (96.2 kg)  10/27/23 213 lb (96.6 kg)      Physical Exam Vitals reviewed.  Constitutional:      General: He is not in acute distress.    Appearance: He is not ill-appearing.  Cardiovascular:     Rate and Rhythm: Normal rate and regular rhythm.  Pulmonary:     Effort: Pulmonary effort is normal.     Breath sounds: Normal breath sounds. No wheezing or rales.  Neurological:     Mental Status: He is alert.      Results for orders placed or performed in visit on 11/18/23  POCT Urinalysis Dipstick (Automated)  Result Value Ref Range   Color, UA Yellow    Clarity, UA Clear    Glucose, UA Positive (A) Negative   Bilirubin, UA Negative    Ketones, UA Negative    Spec Grav, UA 1.015 1.010 - 1.025   Blood, UA Negative    pH, UA 6.0 5.0 - 8.0    Protein, UA Negative Negative   Urobilinogen, UA 0.2 0.2 or 1.0 E.U./dL   Nitrite, UA Negative    Leukocytes, UA Negative Negative      The ASCVD Risk score (Arnett DK, et al., 2019) failed to calculate for the following reasons:   Risk score cannot be calculated because patient has a medical history suggesting prior/existing ASCVD    Assessment & Plan:   Problem List Items Addressed This Visit   None Visit Diagnoses       Dysuria    -  Primary   Relevant Orders   POCT Urinalysis Dipstick (Automated) (Completed)     Addam relates abnormal urine odor since starting Jardiance .  He has, as expected, glucosuria from the Jardiance  but no evidence for infection.  Nitrites, leukocytes, and blood all negative on dipstick.  Reassurance.  We have recommended 60-month follow-up and we will plan to recheck A1c then.  Remind her to stay well-hydrated on Jardiance   Return in about 3 months (around 02/17/2024).    [name], MD

## 2023-11-29 ENCOUNTER — Other Ambulatory Visit: Payer: Self-pay | Admitting: Family Medicine

## 2023-12-01 ENCOUNTER — Encounter: Payer: Self-pay | Admitting: Family Medicine

## 2023-12-03 ENCOUNTER — Encounter: Payer: Self-pay | Admitting: Adult Health

## 2023-12-03 ENCOUNTER — Ambulatory Visit: Admitting: Adult Health

## 2023-12-03 VITALS — BP 110/62 | HR 68 | Temp 97.5°F | Ht 74.0 in | Wt 208.0 lb

## 2023-12-03 DIAGNOSIS — K6289 Other specified diseases of anus and rectum: Secondary | ICD-10-CM

## 2023-12-03 DIAGNOSIS — R0981 Nasal congestion: Secondary | ICD-10-CM | POA: Diagnosis not present

## 2023-12-03 DIAGNOSIS — J302 Other seasonal allergic rhinitis: Secondary | ICD-10-CM | POA: Diagnosis not present

## 2023-12-03 MED ORDER — METHYLPREDNISOLONE ACETATE 40 MG/ML IJ SUSP
40.0000 mg | Freq: Once | INTRAMUSCULAR | Status: AC
  Administered 2023-12-03: 40 mg via INTRAMUSCULAR

## 2023-12-03 NOTE — Progress Notes (Unsigned)
 Subjective:    Patient ID: Peter Snyder, male    DOB: 1956-01-05, 68 y.o.   MRN: [id number]  HPI 68 year old male who  has a past medical history of Allergy, Allergy to sulfa drugs (12/10/2020), Anxiety (2/98), Arthritis, CAD (coronary artery disease) (08/2012), Chicken pox, Chronic back pain, Colon polyps, Depression, Diabetes mellitus (HCC), E coli infection (12/10/2020), Folliculitis (12/10/2020), GERD (gastroesophageal reflux disease), Hyperlipidemia, Hypertension, Kidney stone, and Myocardial infarction (HCC).  He is a patient of Dr. Micheal who I am seeing today for multiple issues.   He report that for the past few days he has been having nasal congestion and rhinorrhea despite using Singulair , Flonase , Astelin  nasal spray.  He reports that he had a water leak in his home and also had people working outside of his yard and was exposed to a lot of dust and allergens and believes that this is the cause of his symptoms.  He has not had any fevers or chills or sinus pressure.  He also reports that he has irritation around his rectum, has had some bowel movements where he has had to wipe multiple times and believes that this has caused some irritation.  He was placed on Jardiance  by cardiology about month ago and heard that he gets infections in his perineal area and this may be a cause.  At home he has been applying Vaseline and cortisone cream for the irritation/itching but this seems to burn when he applies it.     Review of Systems See HPI   Past Medical History:  Diagnosis Date   Allergy    Allergy to sulfa drugs 12/10/2020   Anxiety 2/98   Arthritis    CAD (coronary artery disease) 08/2012   Cardiac catheterization 6/27/ 2014 ejection fraction 35-40%, 30% proximal left circumflex, 100% tiny obtuse marginal 1 with collaterals, 50% LAD, 50% D1, 100% RCA with collaterals   Chicken pox    Chronic back pain    Colon polyps    Depression    Diabetes mellitus (HCC)    Diet  controlled- OFF metformin  02-01-20   E coli infection 12/10/2020   Folliculitis 12/10/2020   GERD (gastroesophageal reflux disease)    Hyperlipidemia    Hypertension    Kidney stone    Myocardial infarction [facility])    ? year- cath 2014/ june     Social History   Socioeconomic History   Marital status: Married    Spouse name: Not on file   Number of children: 3   Years of education: Not on file   Highest education level: Bachelor's degree (e.g., BA, AB, BS)  Occupational History   Occupation: retired    Comment: Retired  Tobacco Use   Smoking status: Former    Current packs/day: 0.00    Average packs/day: 0.3 packs/day for 28.0 years (7.0 ttl pk-yrs)    Types: Cigarettes    Start date: 56    Quit date: 2000    Years since quitting: 25.7   Smokeless tobacco: Never  Vaping Use   Vaping status: Never Used  Substance and Sexual Activity   Alcohol use: No    Alcohol/week: 0.0 standard drinks of alcohol   Drug use: No   Sexual activity: Not on file  Other Topics Concern   Not on file  Social History Narrative   Not on file   Social Drivers of Health   Financial Resource Strain: Low Risk  (08/31/2023)   Overall Financial Resource Strain (CARDIA)  Difficulty of Paying Living Expenses: Not hard at all  Food Insecurity: No Food Insecurity (08/31/2023)   Hunger Vital Sign    Worried About Running Out of Food in the Last Year: Never true    Ran Out of Food in the Last Year: Never true  Transportation Needs: No Transportation Needs (08/31/2023)   PRAPARE - Administrator, Civil Service (Medical): No    Lack of Transportation (Non-Medical): No  Physical Activity: Sufficiently Active (08/31/2023)   Exercise Vital Sign    Days of Exercise per Week: 5 days    Minutes of Exercise per Session: 60 min  Stress: No Stress Concern Present (08/31/2023)   [employer] of Occupational Health - Occupational Stress Questionnaire    Feeling of Stress: Not at all  Social  Connections: Socially Integrated (08/31/2023)   Social Connection and Isolation Panel    Frequency of Communication with Friends and Family: More than three times a week    Frequency of Social Gatherings with Friends and Family: More than three times a week    Attends Religious Services: 1 to 4 times per year    Active Member of [employer] or Organizations: Yes    Attends Engineer, structural: More than 4 times per year    Marital Status: Married  Catering manager Violence: Not At Risk (06/17/2023)   Humiliation, Afraid, Rape, and Kick questionnaire    Fear of Current or Ex-Partner: No    Emotionally Abused: No    Physically Abused: No    Sexually Abused: No    Past Surgical History:  Procedure Laterality Date   CATARACT EXTRACTION  05/2023   COLONOSCOPY     CRYO INTERCOSTAL NERVE BLOCK     double sacrum nerve block   epidural injections     multiple- L5 S1 and cervical neck pain issues as well    POLYPECTOMY     TONSILLECTOMY  1963   TRIGGER FINGER RELEASE  2019    Family History  Problem Relation Age of Onset   Melanoma Mother        metastatic   Cancer Mother        melanoma   Hypertension Mother    Arthritis Father    Colon polyps Father    COPD Father    Hyperlipidemia Maternal Grandmother    Heart disease Maternal Grandmother    Arthritis Paternal Grandmother    Clotting disorder Paternal Grandmother    Clotting disorder Paternal Grandfather    Liver cancer Paternal Grandfather    ADD / ADHD Brother    Early death Brother    Alcohol abuse Son    Colon cancer Neg Hx    Esophageal cancer Neg Hx    Rectal cancer Neg Hx    Stomach cancer Neg Hx     Allergies  Allergen Reactions   Doxycycline  Hyclate Nausea And Vomiting   Latex Other (See Comments)    Other   Tape     Use Paper Tape    Sulfa Antibiotics Rash    Current Outpatient Medications on File Prior to Visit  Medication Sig Dispense Refill   acetaminophen  (TYLENOL ) 500 MG tablet Take 500 mg by  mouth every 6 (six) hours as needed for moderate pain.     ALPRAZolam  (XANAX ) 0.25 MG tablet Take 0.25 mg by mouth daily as needed for anxiety.     aspirin  81 MG tablet Take 81 mg by mouth daily.     atorvastatin  (LIPITOR ) 80 MG  tablet TAKE 1 TABLET BY MOUTH EVERY DAY 90 tablet 1   Azelastine  HCl 137 MCG/SPRAY SOLN PLACE 1 SPRAY INTO BOTH NOSTRILS 2 TIMES DAILY AS DIRECTED 90 mL 1   carvedilol  (COREG ) 12.5 MG tablet TAKE 1 TABLET TWICE A DAY WITH A MEAL 180 tablet 1   diclofenac Sodium (VOLTAREN) 1 % GEL Apply 2 g topically daily as needed (for pain).     empagliflozin  (JARDIANCE ) 10 MG TABS tablet Take 1 tablet (10 mg total) by mouth daily before breakfast. 30 tablet 6   escitalopram  (LEXAPRO ) 20 MG tablet Take 20 mg by mouth at bedtime.     ezetimibe  (ZETIA ) 10 MG tablet TAKE 1 TABLET BY MOUTH EVERY DAY 90 tablet 1   fluticasone  (FLONASE ) 50 MCG/ACT nasal spray Place 2 sprays into both nostrils in the morning and at bedtime. 16 g 6   gabapentin  (NEURONTIN ) 600 MG tablet Take 1 tablet (600 mg total) by mouth 2 (two) times daily. 180 tablet 1   levocetirizine (XYZAL ) 5 MG tablet Take 0.5 tablets (2.5 mg total) by mouth every evening. 30 tablet 6   LOTEMAX 0.5 % GEL SMARTSIG:1 Drop(s) In Eye(s) Twice Daily PRN     montelukast  (SINGULAIR ) 10 MG tablet Take 1 tablet (10 mg total) by mouth at bedtime. 30 tablet 3   pantoprazole  (PROTONIX ) 40 MG tablet TAKE 1 TABLET BY MOUTH TWICE A DAY 180 tablet 3   pimecrolimus (ELIDEL) 1 % cream 1 Application as needed.     RESTASIS 0.05 % ophthalmic emulsion Place 1 drop into both eyes 2 (two) times daily.      sacubitril -valsartan  (ENTRESTO ) 49-51 MG TAKE 1 TABLET BY MOUTH TWICE A DAY 180 tablet 2   tamsulosin  (FLOMAX ) 0.4 MG CAPS capsule TAKE 1 CAPSULE EVERY DAY AFTER SUPPER 90 capsule 1   triamcinolone  cream (KENALOG ) 0.1 % APPLY TOPICALLY TO THE AFFECTED AREA 2 (TWO) TIMES DAILY AS NEEDED. 60 g 1   ULTRAM  50 MG tablet Take 50 mg by mouth 3 (three) times  daily as needed.     zolpidem  (AMBIEN ) 10 MG tablet Take 10 mg by mouth at bedtime as needed for sleep.     No current facility-administered medications on file prior to visit.    BP 110/62   Pulse 68   Temp (!) 97.5 F (36.4 C) (Oral)   Ht 6' 2 (1.88 m)   Wt 208 lb (94.3 kg)   SpO2 97%   BMI 26.71 kg/m       Objective:   Physical Exam Vitals and nursing note reviewed.  Constitutional:      Appearance: Normal appearance.  HENT:     Right Ear: Hearing, tympanic membrane, ear canal and external ear normal.     Left Ear: Hearing, tympanic membrane, ear canal and external ear normal.     Nose: No congestion or rhinorrhea.     Right Turbinates: Enlarged and swollen.     Left Turbinates: Enlarged and swollen.     Right Sinus: No maxillary sinus tenderness or frontal sinus tenderness.     Left Sinus: No maxillary sinus tenderness or frontal sinus tenderness.  Cardiovascular:     Rate and Rhythm: Normal rate and regular rhythm.     Pulses: Normal pulses.     Heart sounds: Normal heart sounds.  Pulmonary:     Effort: Pulmonary effort is normal.     Breath sounds: Normal breath sounds.  Genitourinary:    Rectum: No anal fissure or external hemorrhoid.  Comments: No redness or rash noted around rectum or perineal area  Musculoskeletal:        General: Normal range of motion.  Skin:    General: Skin is warm and dry.  Neurological:     General: No focal deficit present.     Mental Status: He is alert and oriented to person, place, and time.  Psychiatric:        Mood and Affect: Mood normal.        Behavior: Behavior normal.        Thought Content: Thought content normal.        Judgment: Judgment normal.           Assessment & Plan:

## 2023-12-03 NOTE — Patient Instructions (Signed)
 Health Maintenance Due  Topic Date Due   FOOT EXAM  03/30/2021   Influenza Vaccine  10/16/2023   COVID-19 Vaccine (7 - Pfizer risk 2024-25 season) 11/16/2023       06/17/2023   10:18 AM 09/04/2022    8:29 AM 08/04/2022   11:33 AM  Depression screen PHQ 2/9  Decreased Interest 0 0 0  Down, Depressed, Hopeless 0 0 0  PHQ - 2 Score 0 0 0  Altered sleeping  0 0  Tired, decreased energy  0 0  Change in appetite  0 0  Feeling bad or failure about yourself   0 0  Trouble concentrating  0 0  Moving slowly or fidgety/restless  0 0  Suicidal thoughts  0 0  PHQ-9 Score  0 0  Difficult doing work/chores  Not difficult at all Not difficult at all

## 2023-12-10 ENCOUNTER — Ambulatory Visit (INDEPENDENT_AMBULATORY_CARE_PROVIDER_SITE_OTHER): Admitting: Podiatry

## 2023-12-10 DIAGNOSIS — M25572 Pain in left ankle and joints of left foot: Secondary | ICD-10-CM | POA: Diagnosis not present

## 2023-12-10 MED ORDER — TRIAMCINOLONE ACETONIDE 10 MG/ML IJ SUSP
10.0000 mg | Freq: Once | INTRAMUSCULAR | Status: AC
  Administered 2023-12-10: 10 mg

## 2023-12-10 NOTE — Progress Notes (Signed)
 Tenderness in the dorsal aspect of the left foot.  Pinpoint at the area marked to where it hurts the most.  Hurts when he is walking and wearing shoes.   Physical exam:  General appearance: Pleasant, and in no acute distress. AOx3.  Vascular: Pedal pulses: DP 2/4 bilaterally, PT 2/4 bilaterally.  Mild edema lower legs bilaterally. Capillary fill time immediate bilaterally.  Neurological: Grossly intact bilaterally  Dermatologic:   Skin normal temperature bilaterally.  Skin normal color, tone, and texture bilaterally.   Musculoskeletal: Tenderness to palpation over the navicular cuneiform joint lateral left foot.  No tenderness at along Lisfranc's joint.   Diagnosis: 1.  Arthralgia naviculocuneiform joint lateral left  Plan: -Continue wearing good supportive shoes. -injected 3cc 2:1 mixture 0.5 cc Marcaine:Kenolog 10mg /55ml at lateral naviculocuneiform joint left.    Return as needed

## 2023-12-11 ENCOUNTER — Ambulatory Visit: Admitting: Family Medicine

## 2023-12-12 ENCOUNTER — Other Ambulatory Visit: Payer: Self-pay | Admitting: Family Medicine

## 2023-12-14 ENCOUNTER — Encounter: Payer: Self-pay | Admitting: Podiatry

## 2023-12-15 ENCOUNTER — Ambulatory Visit: Attending: Cardiology | Admitting: Pharmacist Clinician (PhC)/ Clinical Pharmacy Specialist

## 2023-12-15 ENCOUNTER — Other Ambulatory Visit (HOSPITAL_COMMUNITY): Payer: Self-pay

## 2023-12-15 ENCOUNTER — Encounter: Payer: Self-pay | Admitting: Pharmacist Clinician (PhC)/ Clinical Pharmacy Specialist

## 2023-12-15 ENCOUNTER — Telehealth: Payer: Self-pay | Admitting: Pharmacist Clinician (PhC)/ Clinical Pharmacy Specialist

## 2023-12-15 DIAGNOSIS — E785 Hyperlipidemia, unspecified: Secondary | ICD-10-CM | POA: Insufficient documentation

## 2023-12-15 NOTE — Telephone Encounter (Signed)
 Pharmacy Patient Advocate Encounter   Received notification from Physician's Office that prior authorization for REPATHA is required/requested.   Insurance verification completed.   The patient is insured through [facility].   Per test claim: PA required; PA submitted to above mentioned insurance via Latent Key/confirmation #/EOC [id number] Status is pending

## 2023-12-15 NOTE — Progress Notes (Signed)
 Office Visit    Patient Name: Peter Snyder Date of Encounter: 12/15/2023  Primary Care Provider:  Micheal Wolm ORN, MD Primary Cardiologist:  Redell Shallow, MD  Chief Complaint    Hyperlipidemia   Significant Past Medical History   CAD Cath 2014 - 50% stenosis in ramus intermedius, LAD, 1st diagonal; occluded RCA with collaterals  HFrEF EF recovered to 50-55%, was as low a 35% in the past;  on Entresto , carvedilol , empagliflozin   HTN Controlled on GDMT  DM2 2/24 A1c 6.8, now on empagliflozin         Allergies  Allergen Reactions   Doxycycline  Hyclate Nausea And Vomiting   Latex Other (See Comments)    Other   Tape     Use Paper Tape    Sulfa Antibiotics Rash    History of Present Illness    Peter Snyder is a 68 y.o. male patient of Dr Shallow, in the office today to discuss options for cholesterol management.  Insurance Carrier:  [employer]  Pharmacy:   [facility] [address]  LDL Cholesterol goal:  LDL < 55  Current Medications:  atorvastatin  80 mg daily, ezetimibe  10 mg daily   Family Hx:   mother's family with high cholesterol, mgm died from MI at 53, her 2 sisters with elevatd cholesterol; brother w/o issues; 2 kids, younger son has LDL of 150, not sure about older son  Social Hx: Tobacco: no Alcohol: no    Diet:  mostly home cooked meals, has been working with dietician to make good choices.  Variety of proteins and vegetables,  not snacking much     Exercise: gym regularly, mix of cardio and weight   Accessory Clinical Findings   Lab Results  Component Value Date   CHOL 129 11/06/2023   HDL 38 (L) 11/06/2023   LDLCALC 74 11/06/2023   LDLDIRECT 70.0 03/20/2016   TRIG 90 11/06/2023   CHOLHDL 3.4 11/06/2023    No results found for: LIPOA  Lab Results  Component Value Date   ALT 17 11/06/2023   AST 19 11/06/2023   ALKPHOS 75 11/06/2023   BILITOT <0.2 11/06/2023   Lab Results  Component Value Date   CREATININE 1.03  11/06/2023   BUN 20 11/06/2023   NA 145 (H) 11/06/2023   K 4.1 11/06/2023   CL 105 11/06/2023   CO2 20 11/06/2023   Lab Results  Component Value Date   HGBA1C 6.4 (A) 07/28/2023    Home Medications    Current Outpatient Medications  Medication Sig Dispense Refill   acetaminophen  (TYLENOL ) 500 MG tablet Take 500 mg by mouth every 6 (six) hours as needed for moderate pain.     ALPRAZolam  (XANAX ) 0.25 MG tablet Take 0.25 mg by mouth daily as needed for anxiety.     aspirin  81 MG tablet Take 81 mg by mouth daily.     atorvastatin  (LIPITOR ) 80 MG tablet TAKE 1 TABLET BY MOUTH EVERY DAY 90 tablet 1   Azelastine  HCl 137 MCG/SPRAY SOLN PLACE 1 SPRAY INTO BOTH NOSTRILS 2 TIMES DAILY AS DIRECTED 90 mL 1   carvedilol  (COREG ) 12.5 MG tablet TAKE 1 TABLET TWICE A DAY WITH A MEAL 180 tablet 1   diclofenac Sodium (VOLTAREN) 1 % GEL Apply 2 g topically daily as needed (for pain).     empagliflozin  (JARDIANCE ) 10 MG TABS tablet Take 1 tablet (10 mg total) by mouth daily before breakfast. 30 tablet 6   escitalopram  (LEXAPRO ) 20 MG tablet Take  20 mg by mouth at bedtime.     ezetimibe  (ZETIA ) 10 MG tablet TAKE 1 TABLET BY MOUTH EVERY DAY 90 tablet 1   fluticasone  (FLONASE ) 50 MCG/ACT nasal spray Place 2 sprays into both nostrils in the morning and at bedtime. 16 g 6   gabapentin  (NEURONTIN ) 600 MG tablet Take 1 tablet (600 mg total) by mouth 2 (two) times daily. 180 tablet 1   levocetirizine (XYZAL ) 5 MG tablet Take 0.5 tablets (2.5 mg total) by mouth every evening. 30 tablet 6   LOTEMAX 0.5 % GEL SMARTSIG:1 Drop(s) In Eye(s) Twice Daily PRN     montelukast  (SINGULAIR ) 10 MG tablet TAKE 1 TABLET BY MOUTH EVERYDAY AT BEDTIME 90 tablet 1   pantoprazole  (PROTONIX ) 40 MG tablet TAKE 1 TABLET BY MOUTH TWICE A DAY 180 tablet 3   pimecrolimus (ELIDEL) 1 % cream 1 Application as needed.     RESTASIS 0.05 % ophthalmic emulsion Place 1 drop into both eyes 2 (two) times daily.      sacubitril -valsartan  (ENTRESTO )  49-51 MG TAKE 1 TABLET BY MOUTH TWICE A DAY 180 tablet 2   tamsulosin  (FLOMAX ) 0.4 MG CAPS capsule TAKE 1 CAPSULE EVERY DAY AFTER SUPPER 90 capsule 1   triamcinolone  cream (KENALOG ) 0.1 % APPLY TOPICALLY TO THE AFFECTED AREA 2 (TWO) TIMES DAILY AS NEEDED. 60 g 1   ULTRAM  50 MG tablet Take 50 mg by mouth 3 (three) times daily as needed.     zolpidem  (AMBIEN ) 10 MG tablet Take 10 mg by mouth at bedtime as needed for sleep.     No current facility-administered medications for this visit.     Assessment & Plan    Dyslipidemia Assessment: Patient with ASCVD not at LDL goal of < 55 Most recent LDL 74 on 11/06/23 Has been compliant with high intensity statin/ezetimibe  : atorvastatin  80 and ezetimibe  10 Reviewed options for lowering LDL cholesterol, including PCSK-9 inhibitors, bempedoic acid and inclisiran.  Discussed mechanisms of action, dosing, side effects, potential decreases in LDL cholesterol and costs.  Also reviewed potential options for patient assistance.  Plan: Patient agreeable to starting Repatha 140 mg q14d Continue with atorvastatin  and ezetimibe  - may consider dose decrease of atorvastatin  or d/c ezetimibe  when results back  Repeat labs after:  3 months  Lipid Liver function   Allean Mink, PharmD CPP [facility] [address]   [place], [place] [address] [phone]  12/15/2023, 1:08 PM

## 2023-12-15 NOTE — Telephone Encounter (Signed)
 Please do PA for Repatha

## 2023-12-15 NOTE — Patient Instructions (Signed)
 Your Results:             Your most recent labs Goal  Total Cholesterol 129 < 200  Triglycerides 90 < 150  HDL (happy/good cholesterol) 38 > 40  LDL (lousy/bad cholesterol 74 < 55   Medication changes:  We will start the process to get Repatha covered by your insurance.  Once the prior authorization is complete, I will call/send a MyChart message to let you know and confirm pharmacy information.   You will take one injection every 4 weeks  Lab orders:  We want to repeat labs after 2-3 months.  We will send you a lab order to remind you once we get closer to that time.     Thank you for choosing [facility]

## 2023-12-15 NOTE — Assessment & Plan Note (Signed)
 Assessment: Patient with ASCVD not at LDL goal of < 55 Most recent LDL 74 on 11/06/23 Has been compliant with high intensity statin/ezetimibe  : atorvastatin  80 and ezetimibe  10 Reviewed options for lowering LDL cholesterol, including PCSK-9 inhibitors, bempedoic acid and inclisiran.  Discussed mechanisms of action, dosing, side effects, potential decreases in LDL cholesterol and costs.  Also reviewed potential options for patient assistance.  Plan: Patient agreeable to starting Repatha 140 mg q14d Continue with atorvastatin  and ezetimibe  - may consider dose decrease of atorvastatin  or d/c ezetimibe  when results back  Repeat labs after:  3 months  Lipid Liver function

## 2023-12-17 ENCOUNTER — Other Ambulatory Visit (HOSPITAL_COMMUNITY): Payer: Self-pay

## 2023-12-17 NOTE — Telephone Encounter (Signed)
 Pharmacy Patient Advocate Encounter  Received notification from [facility] that Prior Authorization for REPATHA has been APPROVED from 12/16/23 to 06/15/24. Ran test claim, Copay is $0. This test claim was processed through [facility]- copay amounts may vary at other pharmacies due to pharmacy/plan contracts, or as the patient moves through the different stages of their insurance plan.

## 2023-12-22 ENCOUNTER — Encounter: Payer: Self-pay | Admitting: Pharmacist Clinician (PhC)/ Clinical Pharmacy Specialist

## 2023-12-22 DIAGNOSIS — E785 Hyperlipidemia, unspecified: Secondary | ICD-10-CM

## 2023-12-22 MED ORDER — REPATHA SURECLICK 140 MG/ML ~~LOC~~ SOAJ: 140.0000 mg | SUBCUTANEOUS | 3 refills | Status: DC

## 2023-12-22 NOTE — Addendum Note (Signed)
 Addended by: Lakeasha Petion L on: 12/22/2023 11:30 AM   Modules accepted: Orders

## 2023-12-23 ENCOUNTER — Ambulatory Visit: Admitting: Podiatry

## 2023-12-23 ENCOUNTER — Encounter: Payer: Self-pay | Admitting: Podiatry

## 2023-12-23 DIAGNOSIS — M25572 Pain in left ankle and joints of left foot: Secondary | ICD-10-CM

## 2023-12-23 MED ORDER — TRIAMCINOLONE ACETONIDE 10 MG/ML IJ SUSP
10.0000 mg | Freq: Once | INTRAMUSCULAR | Status: AC
  Administered 2023-12-23: 10 mg

## 2023-12-23 NOTE — Progress Notes (Signed)
 Patient presents complains  of pain indicates over the dorsal and lateral aspect of the talonavicular joint left.  Does not recall follow any injury to it.  Has been doing pretty well.  With the foot otherwise.  Has not noticed any redness or ecchymosis   Physical exam:  General appearance: Pleasant, and in no acute distress. AOx3.  Vascular: Pedal pulses: DP 2/4 bilaterally, PT 2/4 bilaterally.  Mild edema lower legs bilaterally. Capillary fill time immediate.  Neurological:   Grossly intact bilaterally.  Negative Tinel sign peroneal nerve left.  Dermatologic:   Skin normal temperature bilaterally.  Skin normal color, tone, and texture bilaterally.   Musculoskeletal: Tenderness to palpation talonavicular joint range of motion.  Left.  No synovial thickening noted.   Diagnosis: 1.  Arthralgia talonavicular left  Plan: -Most the pain now seems to be at the talonavicular joint left.  Wear good stable shoe also showed with the shoelaces better with laces. -injected 3cc 2:1 mixture 0.5 cc Marcaine:Kenolog 10mg /35ml at the talonavicular joint left only.    Return prn

## 2023-12-24 ENCOUNTER — Ambulatory Visit (INDEPENDENT_AMBULATORY_CARE_PROVIDER_SITE_OTHER)

## 2023-12-24 ENCOUNTER — Encounter (INDEPENDENT_AMBULATORY_CARE_PROVIDER_SITE_OTHER): Payer: Self-pay

## 2023-12-24 VITALS — BP 126/69 | HR 79 | Temp 98.0°F | Ht 74.0 in | Wt 208.0 lb

## 2023-12-24 DIAGNOSIS — J019 Acute sinusitis, unspecified: Secondary | ICD-10-CM | POA: Diagnosis not present

## 2023-12-24 DIAGNOSIS — J309 Allergic rhinitis, unspecified: Secondary | ICD-10-CM

## 2023-12-24 DIAGNOSIS — J3489 Other specified disorders of nose and nasal sinuses: Secondary | ICD-10-CM

## 2023-12-24 DIAGNOSIS — J342 Deviated nasal septum: Secondary | ICD-10-CM

## 2023-12-24 NOTE — Patient Instructions (Signed)
 Sinupret oral pill Pretz MD nasal spray

## 2023-12-24 NOTE — Progress Notes (Signed)
 Dear Dr. Micheal, Here is my assessment for our mutual patient, Peter Snyder. Thank you for allowing me the opportunity to care for your patient. Please do not hesitate to contact me should you have any other questions. Sincerely, Dr. Penne Croak  Otolaryngology Clinic Note Referring provider: Dr. Micheal HPI:  Discussed the use of AI scribe software for clinical note transcription with the patient, who gave verbal consent to proceed.  History of Present Illness Peter Snyder is a 68 year old male who presents with nasal congestion and sinus issues.  Nasal congestion and sinus symptoms - Significant nasal congestion and sinus issues, worsened by recent home renovations (ceiling scraping) and yard work with increased dust and particle exposure - Required a steroid injection to alleviate nasal congestion - Currently less congested, able to maintain daily activities including exercise - No significant nasal discharge, green mucus, or colored nasal discharge - No chest pain or shortness of breath  Epistaxis - Recent episode of minor epistaxis, now resolved  Medication and symptom management - Uses Xylitol (Xycleer) nasal spray approximately four times daily, preferred over neti pot rinses due to discomfort with the latter - Uses Singulair  with symptomatic benefit - Experienced drowsiness with levocetirizine, even at half dose, and discontinued use - Uses nasal saline rinses - Utilizes a humidifier and air purifier at home, contributing to symptom relief - Plans to change air filters at home to further reduce exposure to irritants  Independent Review of Additional Tests or Records:  Reviewed external note from referring PCP, Burchette,describing  Chief Complaint  Patient presents with   Follow-up  . Relevant history incorporated into today's evaluation. I personally reviewed and interpreted - none  PMH/Meds/All/SocHx/FamHx/ROS:   Past Medical History:  Diagnosis Date    Allergy    Allergy to sulfa drugs 12/10/2020   Anxiety 2/98   Arthritis    CAD (coronary artery disease) 08/2012   Cardiac catheterization 6/27/ 2014 ejection fraction 35-40%, 30% proximal left circumflex, 100% tiny obtuse marginal 1 with collaterals, 50% LAD, 50% D1, 100% RCA with collaterals   Chicken pox    Chronic back pain    Colon polyps    Depression    Diabetes mellitus (HCC)    Diet controlled- OFF metformin  02-01-20   E coli infection 12/10/2020   Folliculitis 12/10/2020   GERD (gastroesophageal reflux disease)    Hyperlipidemia    Hypertension    Kidney stone    Myocardial infarction [facility])    ? year- cath 2014/ june      Past Surgical History:  Procedure Laterality Date   CATARACT EXTRACTION  05/2023   COLONOSCOPY     CRYO INTERCOSTAL NERVE BLOCK     double sacrum nerve block   epidural injections     multiple- L5 S1 and cervical neck pain issues as well    POLYPECTOMY     TONSILLECTOMY  1963   TRIGGER FINGER RELEASE  2019    Family History  Problem Relation Age of Onset   Melanoma Mother        metastatic   Cancer Mother        melanoma   Hypertension Mother    Arthritis Father    Colon polyps Father    COPD Father    Hyperlipidemia Maternal Grandmother    Heart disease Maternal Grandmother    Arthritis Paternal Grandmother    Clotting disorder Paternal Grandmother    Clotting disorder Paternal Grandfather    Liver cancer Paternal Grandfather  ADD / ADHD Brother    Early death Brother    Alcohol abuse Son    Colon cancer Neg Hx    Esophageal cancer Neg Hx    Rectal cancer Neg Hx    Stomach cancer Neg Hx      Social Connections: Socially Integrated (08/31/2023)   Social Connection and Isolation Panel    Frequency of Communication with Friends and Family: More than three times a week    Frequency of Social Gatherings with Friends and Family: More than three times a week    Attends Religious Services: 1 to 4 times per year    Active Member of  [employer] or Organizations: Yes    Attends Engineer, structural: More than 4 times per year    Marital Status: Married      Current Outpatient Medications:    acetaminophen  (TYLENOL ) 500 MG tablet, Take 500 mg by mouth every 6 (six) hours as needed for moderate pain., Disp: , Rfl:    ALPRAZolam  (XANAX ) 0.25 MG tablet, Take 0.25 mg by mouth daily as needed for anxiety., Disp: , Rfl:    aspirin  81 MG tablet, Take 81 mg by mouth daily., Disp: , Rfl:    atorvastatin  (LIPITOR ) 80 MG tablet, TAKE 1 TABLET BY MOUTH EVERY DAY, Disp: 90 tablet, Rfl: 1   Azelastine  HCl 137 MCG/SPRAY SOLN, PLACE 1 SPRAY INTO BOTH NOSTRILS 2 TIMES DAILY AS DIRECTED, Disp: 90 mL, Rfl: 1   carvedilol  (COREG ) 12.5 MG tablet, TAKE 1 TABLET TWICE A DAY WITH A MEAL, Disp: 180 tablet, Rfl: 1   diclofenac Sodium (VOLTAREN) 1 % GEL, Apply 2 g topically daily as needed (for pain)., Disp: , Rfl:    empagliflozin  (JARDIANCE ) 10 MG TABS tablet, Take 1 tablet (10 mg total) by mouth daily before breakfast., Disp: 30 tablet, Rfl: 6   escitalopram  (LEXAPRO ) 20 MG tablet, Take 20 mg by mouth at bedtime., Disp: , Rfl:    Evolocumab (REPATHA SURECLICK) 140 MG/ML SOAJ, Inject 140 mg into the skin every 14 (fourteen) days., Disp: 6 mL, Rfl: 3   ezetimibe  (ZETIA ) 10 MG tablet, TAKE 1 TABLET BY MOUTH EVERY DAY, Disp: 90 tablet, Rfl: 1   fluticasone  (FLONASE ) 50 MCG/ACT nasal spray, Place 2 sprays into both nostrils in the morning and at bedtime., Disp: 16 g, Rfl: 6   gabapentin  (NEURONTIN ) 600 MG tablet, Take 1 tablet (600 mg total) by mouth 2 (two) times daily., Disp: 180 tablet, Rfl: 1   levocetirizine (XYZAL ) 5 MG tablet, Take 0.5 tablets (2.5 mg total) by mouth every evening., Disp: 30 tablet, Rfl: 6   LOTEMAX 0.5 % GEL, SMARTSIG:1 Drop(s) In Eye(s) Twice Daily PRN, Disp: , Rfl:    montelukast  (SINGULAIR ) 10 MG tablet, TAKE 1 TABLET BY MOUTH EVERYDAY AT BEDTIME, Disp: 90 tablet, Rfl: 1   pantoprazole  (PROTONIX ) 40 MG tablet, TAKE 1  TABLET BY MOUTH TWICE A DAY, Disp: 180 tablet, Rfl: 3   pimecrolimus (ELIDEL) 1 % cream, 1 Application as needed., Disp: , Rfl:    RESTASIS 0.05 % ophthalmic emulsion, Place 1 drop into both eyes 2 (two) times daily. , Disp: , Rfl:    sacubitril -valsartan  (ENTRESTO ) 49-51 MG, TAKE 1 TABLET BY MOUTH TWICE A DAY, Disp: 180 tablet, Rfl: 2   tamsulosin  (FLOMAX ) 0.4 MG CAPS capsule, TAKE 1 CAPSULE EVERY DAY AFTER SUPPER, Disp: 90 capsule, Rfl: 1   triamcinolone  cream (KENALOG ) 0.1 %, APPLY TOPICALLY TO THE AFFECTED AREA 2 (TWO) TIMES DAILY AS NEEDED.,  Disp: 60 g, Rfl: 1   ULTRAM  50 MG tablet, Take 50 mg by mouth 3 (three) times daily as needed., Disp: , Rfl:    zolpidem  (AMBIEN ) 10 MG tablet, Take 10 mg by mouth at bedtime as needed for sleep., Disp: , Rfl:    Physical Exam:   BP 126/69 (BP Location: Left Arm, Patient Position: Sitting, Cuff Size: Normal)   Pulse 79   Temp 98 F (36.7 C) (Oral)   Ht 6' 2 (1.88 m)   Wt 208 lb (94.3 kg)   SpO2 98%   BMI 26.71 kg/m   The patient was awake, alert, and appropriate. The external ears were inspected, and otoscopy was performed to evaluate the external auditory canals and tympanic membranes. The nasal cavity and septum were examined for mucosal changes, obstruction, or discharge. The oral cavity and oropharynx were inspected for mucosal lesions, infection, or tonsillar hypertrophy. The neck was palpated for lymphadenopathy, thyroid  abnormalities, or other masses. Cranial nerve function was grossly intact.  Pertinent Findings: Physical Exam HEENT: Nasal passages with some crusting on the left, no significant discharge. Normal oropharynx, no abnormalities in the throat. Tms normal.   Impression & Plans:  Peter Snyder is a 68 y.o. male  No diagnosis found.  - Findings and diagnoses discussed in detail with the patient. - Risks, benefits, and alternatives were reviewed. Through shared decision making, the patient elects to proceed with  below.  Assessment and Plan Assessment & Plan Allergic rhinitis Exacerbated by dust and particles from home renovations. Levocetirizine ineffective and caused drowsiness - even tried half tablet/day. Current management with Singulair  and Xylitol nasal spray effective. Humidifier and air purifier beneficial. - Discontinue levocetirizine due to drowsiness. - Continue Singulair  and Xylitol nasal spray. - Use humidifier and air purifier. - Change air filters to reduce dust exposure.  Acute sinusitis Possible acute sinusitis due to dust exposure. Symptoms include nasal congestion and minor epistaxis. No significant purulent discharge. - Monitor symptoms, consider CT scan if no improvement. - Consider Augmentin  if symptoms persist. - Pt prefers to call for imaging order or rx.   - Orders placed: No orders of the defined types were placed in this encounter.  - Medications prescribed/continued/adjusted: No orders of the defined types were placed in this encounter.  - Education materials provided to the patient. - Follow up: January. Patient instructed to return sooner or go to the ED if new/worsening symptoms develop.   Thank you for allowing me the opportunity to care for your patient. Please do not hesitate to contact me should you have any other questions.  Sincerely, Penne Croak, DO Otolaryngologist (ENT) [facility] Phone: [phone] Fax: [phone]  12/24/2023, 1:56 PM

## 2023-12-28 ENCOUNTER — Ambulatory Visit (INDEPENDENT_AMBULATORY_CARE_PROVIDER_SITE_OTHER): Admitting: Family Medicine

## 2023-12-28 VITALS — BP 120/60 | HR 76 | Temp 98.2°F | Wt 206.1 lb

## 2023-12-28 DIAGNOSIS — R300 Dysuria: Secondary | ICD-10-CM

## 2023-12-28 DIAGNOSIS — N400 Enlarged prostate without lower urinary tract symptoms: Secondary | ICD-10-CM

## 2023-12-28 LAB — POC URINALSYSI DIPSTICK (AUTOMATED)
Bilirubin, UA: NEGATIVE
Blood, UA: NEGATIVE
Glucose, UA: POSITIVE — AB
Ketones, UA: NEGATIVE
Leukocytes, UA: NEGATIVE
Nitrite, UA: NEGATIVE
Protein, UA: NEGATIVE
Spec Grav, UA: 1.015 (ref 1.010–1.025)
Urobilinogen, UA: 0.2 U/dL
pH, UA: 6 (ref 5.0–8.0)

## 2023-12-28 NOTE — Progress Notes (Signed)
 Established Patient Office Visit  Subjective   Patient ID: Peter Snyder, male    DOB: [birth year]  Age: 68 y.o. MRN: [id number]  Chief Complaint  Patient presents with   Urinary Frequency    HPI   Peter Snyder is seen as a work in with couple day history of some urine frequency and slow stream.  Peter Snyder has had occasional mild burning with urination.  Did recently go on Jardiance  per cardiology.  No fever.  No chills.  No flank pain.  Peter Snyder states his slow urinary stream is somewhat inconsistent.  Peter Snyder does have history of BPH already takes Flomax  0.4 mg regularly.  No recent anticholinergic medications.  Past Medical History:  Diagnosis Date   Allergy    Allergy to sulfa drugs 12/10/2020   Anxiety 2/98   Arthritis    CAD (coronary artery disease) 08/2012   Cardiac catheterization 6/27/ 2014 ejection fraction 35-40%, 30% proximal left circumflex, 100% tiny obtuse marginal 1 with collaterals, 50% LAD, 50% D1, 100% RCA with collaterals   Chicken pox    Chronic back pain    Colon polyps    Depression    Diabetes mellitus (HCC)    Diet controlled- OFF metformin  02-01-20   E coli infection 12/10/2020   Folliculitis 12/10/2020   GERD (gastroesophageal reflux disease)    Hyperlipidemia    Hypertension    Kidney stone    Myocardial infarction [facility])    ? year- cath 2014/ june    Past Surgical History:  Procedure Laterality Date   CATARACT EXTRACTION  05/2023   COLONOSCOPY     CRYO INTERCOSTAL NERVE BLOCK     double sacrum nerve block   epidural injections     multiple- L5 S1 and cervical neck pain issues as well    POLYPECTOMY     TONSILLECTOMY  1963   TRIGGER FINGER RELEASE  2019    reports that Peter Snyder quit smoking about 25 years ago. His smoking use included cigarettes. Peter Snyder started smoking about 53 years ago. Peter Snyder has a 7 pack-year smoking history. Peter Snyder has never used smokeless tobacco. Peter Snyder reports that Peter Snyder does not drink alcohol and does not use drugs. family history includes ADD / ADHD in his  brother; Alcohol abuse in his son; Arthritis in his father and paternal grandmother; COPD in his father; Cancer in his mother; Clotting disorder in his paternal grandfather and paternal grandmother; Colon polyps in his father; Early death in his brother; Heart disease in his maternal grandmother; Hyperlipidemia in his maternal grandmother; Hypertension in his mother; Liver cancer in his paternal grandfather; Melanoma in his mother. Allergies  Allergen Reactions   Doxycycline  Hyclate Nausea And Vomiting   Latex Other (See Comments)    Other   Tape     Use Paper Tape    Sulfa Antibiotics Rash    Review of Systems  Constitutional:  Negative for chills and fever.  Genitourinary:  Positive for frequency. Negative for flank pain, hematuria and urgency.      Objective:     BP 120/60   Pulse 76   Temp 98.2 F (36.8 C) (Oral)   Wt 206 lb 1.6 oz (93.5 kg)   SpO2 96%   BMI 26.46 kg/m  BP Readings from Last 3 Encounters:  12/28/23 120/60  12/24/23 126/69  12/03/23 110/62   Wt Readings from Last 3 Encounters:  12/28/23 206 lb 1.6 oz (93.5 kg)  12/24/23 208 lb (94.3 kg)  12/03/23 208 lb (94.3 kg)  Physical Exam Vitals reviewed.  Constitutional:      General: Peter Snyder is not in acute distress.    Appearance: Peter Snyder is not ill-appearing.  Cardiovascular:     Rate and Rhythm: Normal rate and regular rhythm.  Pulmonary:     Effort: Pulmonary effort is normal.     Breath sounds: Normal breath sounds.  Neurological:     Mental Status: Peter Snyder is alert.      No results found for any visits on 12/28/23.    The ASCVD Risk score (Arnett DK, et al., 2019) failed to calculate for the following reasons:   Risk score cannot be calculated because patient has a medical history suggesting prior/existing ASCVD    Assessment & Plan:   Urinary symptoms of frequency and intermittent slow stream and dribbling.  Recent initiation of Jardiance .  Already takes Flomax  for BPH.  -Check urine  dipstick--this shows, as expected glucosuria, but no signs of infection - Avoid anticholinergic medications - Did discuss possible additional medication to Flomax  such as finasteride but at this point Peter Snyder wishes to observe as symptoms are somewhat intermittent  [name], MD

## 2023-12-30 ENCOUNTER — Encounter: Payer: Self-pay | Admitting: Pharmacist Clinician (PhC)/ Clinical Pharmacy Specialist

## 2023-12-31 ENCOUNTER — Ambulatory Visit: Admitting: Adult Health

## 2023-12-31 ENCOUNTER — Other Ambulatory Visit: Payer: Self-pay | Admitting: Pharmacist Clinician (PhC)/ Clinical Pharmacy Specialist

## 2023-12-31 ENCOUNTER — Encounter: Payer: Self-pay | Admitting: Adult Health

## 2023-12-31 VITALS — BP 110/60 | HR 76 | Temp 98.1°F | Ht 74.0 in | Wt 208.0 lb

## 2023-12-31 DIAGNOSIS — J069 Acute upper respiratory infection, unspecified: Secondary | ICD-10-CM

## 2023-12-31 MED ORDER — BENZONATATE 200 MG PO CAPS: 200.0000 mg | ORAL_CAPSULE | Freq: Three times a day (TID) | ORAL | 0 refills | Status: DC | PRN

## 2023-12-31 NOTE — Progress Notes (Signed)
 Having issues with Repatha, will not be able to tolerate, let's switch to Leqvio.

## 2023-12-31 NOTE — Progress Notes (Signed)
 Subjective:    Patient ID: Peter Snyder, male    DOB: November 14, 1955, 68 y.o.   MRN: [id number]  HPI  Discussed the use of AI scribe software for clinical note transcription with the patient, who gave verbal consent to proceed.  History of Present Illness   Peter Snyder is a 68 year old male who presents to the office today for an acute visit.   He has experienced cough and sinus congestion since yesterday, with nasal discharge that is mostly clear but occasionally yellow and green. His nose feels rough, and he has been sneezing throughout the night. M  He is under the care of an ENT specialist and takes Singulair , Flonase , and acetylcysteine. He uses saline nasal rinses with xylitol.  Benoxinate, taken at 200 mg three times a day for cough suppression, is ineffective.        Review of Systems See HPI   Past Medical History:  Diagnosis Date   Allergy    Allergy to sulfa drugs 12/10/2020   Anxiety 2/98   Arthritis    CAD (coronary artery disease) 08/2012   Cardiac catheterization 6/27/ 2014 ejection fraction 35-40%, 30% proximal left circumflex, 100% tiny obtuse marginal 1 with collaterals, 50% LAD, 50% D1, 100% RCA with collaterals   Chicken pox    Chronic back pain    Colon polyps    Depression    Diabetes mellitus (HCC)    Diet controlled- OFF metformin  02-01-20   E coli infection 12/10/2020   Folliculitis 12/10/2020   GERD (gastroesophageal reflux disease)    Hyperlipidemia    Hypertension    Kidney stone    Myocardial infarction [facility])    ? year- cath 2014/ june     Social History   Socioeconomic History   Marital status: Married    Spouse name: Not on file   Number of children: 3   Years of education: Not on file   Highest education level: Bachelor's degree (e.g., BA, AB, BS)  Occupational History   Occupation: retired    Comment: Retired  Tobacco Use   Smoking status: Former    Current packs/day: 0.00    Average packs/day: 0.3 packs/day for 28.0 years  (7.0 ttl pk-yrs)    Types: Cigarettes    Start date: 13    Quit date: 2000    Years since quitting: 25.8   Smokeless tobacco: Never  Vaping Use   Vaping status: Never Used  Substance and Sexual Activity   Alcohol use: No    Alcohol/week: 0.0 standard drinks of alcohol   Drug use: No   Sexual activity: Not on file  Other Topics Concern   Not on file  Social History Narrative   Not on file   Social Drivers of Health   Financial Resource Strain: Low Risk  (12/28/2023)   Overall Financial Resource Strain (CARDIA)    Difficulty of Paying Living Expenses: Not hard at all  Food Insecurity: No Food Insecurity (12/28/2023)   Hunger Vital Sign    Worried About Running Out of Food in the Last Year: Never true    Ran Out of Food in the Last Year: Never true  Transportation Needs: No Transportation Needs (12/28/2023)   PRAPARE - Administrator, Civil Service (Medical): No    Lack of Transportation (Non-Medical): No  Physical Activity: Sufficiently Active (12/28/2023)   Exercise Vital Sign    Days of Exercise per Week: 5 days    Minutes of Exercise per Session: 60  min  Stress: Stress Concern Present (12/28/2023)   [employer] of Occupational Health - Occupational Stress Questionnaire    Feeling of Stress: To some extent  Social Connections: Socially Integrated (12/28/2023)   Social Connection and Isolation Panel    Frequency of Communication with Friends and Family: More than three times a week    Frequency of Social Gatherings with Friends and Family: More than three times a week    Attends Religious Services: 1 to 4 times per year    Active Member of [employer] or Organizations: Yes    Attends Engineer, structural: More than 4 times per year    Marital Status: Married  Catering manager Violence: Not At Risk (06/17/2023)   Humiliation, Afraid, Rape, and Kick questionnaire    Fear of Current or Ex-Partner: No    Emotionally Abused: No    Physically Abused: No     Sexually Abused: No    Past Surgical History:  Procedure Laterality Date   CATARACT EXTRACTION  05/2023   COLONOSCOPY     CRYO INTERCOSTAL NERVE BLOCK     double sacrum nerve block   epidural injections     multiple- L5 S1 and cervical neck pain issues as well    POLYPECTOMY     TONSILLECTOMY  1963   TRIGGER FINGER RELEASE  2019    Family History  Problem Relation Age of Onset   Melanoma Mother        metastatic   Cancer Mother        melanoma   Hypertension Mother    Arthritis Father    Colon polyps Father    COPD Father    Hyperlipidemia Maternal Grandmother    Heart disease Maternal Grandmother    Arthritis Paternal Grandmother    Clotting disorder Paternal Grandmother    Clotting disorder Paternal Grandfather    Liver cancer Paternal Grandfather    ADD / ADHD Brother    Early death Brother    Alcohol abuse Son    Colon cancer Neg Hx    Esophageal cancer Neg Hx    Rectal cancer Neg Hx    Stomach cancer Neg Hx     Allergies  Allergen Reactions   Doxycycline  Hyclate Nausea And Vomiting   Latex Other (See Comments)    Other   Tape     Use Paper Tape    Sulfa Antibiotics Rash    Current Outpatient Medications on File Prior to Visit  Medication Sig Dispense Refill   acetaminophen  (TYLENOL ) 500 MG tablet Take 500 mg by mouth every 6 (six) hours as needed for moderate pain.     ALPRAZolam  (XANAX ) 0.25 MG tablet Take 0.25 mg by mouth daily as needed for anxiety.     aspirin  81 MG tablet Take 81 mg by mouth daily.     atorvastatin  (LIPITOR ) 80 MG tablet TAKE 1 TABLET BY MOUTH EVERY DAY 90 tablet 1   Azelastine  HCl 137 MCG/SPRAY SOLN PLACE 1 SPRAY INTO BOTH NOSTRILS 2 TIMES DAILY AS DIRECTED 90 mL 1   carvedilol  (COREG ) 12.5 MG tablet TAKE 1 TABLET TWICE A DAY WITH A MEAL 180 tablet 1   diclofenac Sodium (VOLTAREN) 1 % GEL Apply 2 g topically daily as needed (for pain).     empagliflozin  (JARDIANCE ) 10 MG TABS tablet Take 1 tablet (10 mg total) by mouth daily  before breakfast. 30 tablet 6   escitalopram  (LEXAPRO ) 20 MG tablet Take 20 mg by mouth at bedtime.  Evolocumab (REPATHA SURECLICK) 140 MG/ML SOAJ Inject 140 mg into the skin every 14 (fourteen) days. 6 mL 3   ezetimibe  (ZETIA ) 10 MG tablet TAKE 1 TABLET BY MOUTH EVERY DAY 90 tablet 1   fluticasone  (FLONASE ) 50 MCG/ACT nasal spray Place 2 sprays into both nostrils in the morning and at bedtime. 16 g 6   gabapentin  (NEURONTIN ) 600 MG tablet Take 1 tablet (600 mg total) by mouth 2 (two) times daily. 180 tablet 1   levocetirizine (XYZAL ) 5 MG tablet Take 0.5 tablets (2.5 mg total) by mouth every evening. 30 tablet 6   LOTEMAX 0.5 % GEL SMARTSIG:1 Drop(s) In Eye(s) Twice Daily PRN     montelukast  (SINGULAIR ) 10 MG tablet TAKE 1 TABLET BY MOUTH EVERYDAY AT BEDTIME 90 tablet 1   pantoprazole  (PROTONIX ) 40 MG tablet TAKE 1 TABLET BY MOUTH TWICE A DAY 180 tablet 3   pimecrolimus (ELIDEL) 1 % cream 1 Application as needed.     RESTASIS 0.05 % ophthalmic emulsion Place 1 drop into both eyes 2 (two) times daily.      sacubitril -valsartan  (ENTRESTO ) 49-51 MG TAKE 1 TABLET BY MOUTH TWICE A DAY 180 tablet 2   tamsulosin  (FLOMAX ) 0.4 MG CAPS capsule TAKE 1 CAPSULE EVERY DAY AFTER SUPPER 90 capsule 1   triamcinolone  cream (KENALOG ) 0.1 % APPLY TOPICALLY TO THE AFFECTED AREA 2 (TWO) TIMES DAILY AS NEEDED. 60 g 1   ULTRAM  50 MG tablet Take 50 mg by mouth 3 (three) times daily as needed.     zolpidem  (AMBIEN ) 10 MG tablet Take 10 mg by mouth at bedtime as needed for sleep.     No current facility-administered medications on file prior to visit.    BP 110/60   Pulse 76   Temp 98.1 F (36.7 C) (Oral)   Ht 6' 2 (1.88 m)   Wt 208 lb (94.3 kg)   SpO2 96%   BMI 26.71 kg/m       Objective:   Physical Exam Vitals and nursing note reviewed.  Constitutional:      Appearance: Normal appearance.  HENT:     Nose: Congestion and rhinorrhea present. Rhinorrhea is clear.     Right Turbinates: Not enlarged  or swollen.     Left Turbinates: Not enlarged or swollen.     Right Sinus: No maxillary sinus tenderness or frontal sinus tenderness.     Left Sinus: No maxillary sinus tenderness or frontal sinus tenderness.  Cardiovascular:     Rate and Rhythm: Regular rhythm.     Pulses: Normal pulses.     Heart sounds: Normal heart sounds.  Pulmonary:     Effort: Pulmonary effort is normal.     Breath sounds: Normal breath sounds.  Musculoskeletal:        General: Normal range of motion.  Skin:    General: Skin is warm and dry.     Capillary Refill: Capillary refill takes less than 2 seconds.  Neurological:     General: No focal deficit present.     Mental Status: He is oriented to person, place, and time.  Psychiatric:        Mood and Affect: Mood normal.        Behavior: Behavior normal.        Thought Content: Thought content normal.        Judgment: Judgment normal.        Assessment & Plan:  1. Viral URI (Primary) - Advised rest and hydration. If symptoms continue  for the next week then follow up  - benzonatate  (TESSALON ) 200 MG capsule; Take 1 capsule (200 mg total) by mouth 3 (three) times daily as needed.  Dispense: 30 capsule; Refill: 0  Darleene Shape, NP  I personally spent a total of 33 minutes in the care of the patient today including preparing to see the patient, getting/reviewing separately obtained history, performing a medically appropriate exam/evaluation, counseling and educating, documenting clinical information in the EHR, and listening .

## 2024-01-01 ENCOUNTER — Telehealth: Payer: Self-pay

## 2024-01-01 ENCOUNTER — Encounter: Payer: Self-pay | Admitting: Adult Health

## 2024-01-01 NOTE — Telephone Encounter (Signed)
**Note De-identified  [place] Obfuscation** Please advise 

## 2024-01-01 NOTE — Telephone Encounter (Signed)
 Dr. Pietro, patient will be scheduled as soon as possible.  Auth Submission: NO AUTH NEEDED Site of care: Site of care: [facility] Payer: Medicare A/B with BCBS supplement Medication & CPT/J Code(s) submitted: Leqvio (Inclisiran) J1306 Diagnosis Code:  Route of submission (phone, fax, portal):  Phone # Fax # Auth type: Buy/Bill PB Units/visits requested: 284mg  x 2 doses Reference number:  Approval from: 01/01/24 to 04/16/24   Medicare A/B will cover 80%, BCBS supplement will cover the remaining 20%.

## 2024-01-04 ENCOUNTER — Other Ambulatory Visit (HOSPITAL_BASED_OUTPATIENT_CLINIC_OR_DEPARTMENT_OTHER): Payer: Self-pay | Admitting: Pharmacist Clinician (PhC)/ Clinical Pharmacy Specialist

## 2024-01-05 ENCOUNTER — Ambulatory Visit: Admitting: Family Medicine

## 2024-01-05 VITALS — BP 100/60 | HR 66 | Temp 97.8°F | Wt 208.5 lb

## 2024-01-05 DIAGNOSIS — R0981 Nasal congestion: Secondary | ICD-10-CM | POA: Diagnosis not present

## 2024-01-05 DIAGNOSIS — R040 Epistaxis: Secondary | ICD-10-CM

## 2024-01-05 NOTE — Progress Notes (Signed)
 Established Patient Office Visit  Subjective   Patient ID: Peter Snyder, male    DOB: [birth year]  Age: 68 y.o. MRN: [id number]  Chief Complaint  Patient presents with   Sinusitis    HPI   Peter Snyder is seen today with some sinus congestive symptoms.  He has history of CAD and hyperlipidemia and is on Repatha and was aware that this may increase sinusitis symptoms.  Because of his ongoing sinus issues he was recently switched to Leqvio.  Followed through cardiology lipid clinic.  Last LDL cholesterol 74.  He has been followed by ENT.  Maintained on regular Flonase  and Astelin .  Past several days has had a little bloody nasal discharge.  No purulent secretions.  No fever.  No localizing facial pain.  Occasional cough.  Past Medical History:  Diagnosis Date   Allergy    Allergy to sulfa drugs 12/10/2020   Anxiety 2/98   Arthritis    CAD (coronary artery disease) 08/2012   Cardiac catheterization 6/27/ 2014 ejection fraction 35-40%, 30% proximal left circumflex, 100% tiny obtuse marginal 1 with collaterals, 50% LAD, 50% D1, 100% RCA with collaterals   Chicken pox    Chronic back pain    Colon polyps    Depression    Diabetes mellitus (HCC)    Diet controlled- OFF metformin  02-01-20   E coli infection 12/10/2020   Folliculitis 12/10/2020   GERD (gastroesophageal reflux disease)    Hyperlipidemia    Hypertension    Kidney stone    Myocardial infarction [facility])    ? year- cath 2014/ june    Past Surgical History:  Procedure Laterality Date   CATARACT EXTRACTION  05/2023   COLONOSCOPY     CRYO INTERCOSTAL NERVE BLOCK     double sacrum nerve block   epidural injections     multiple- L5 S1 and cervical neck pain issues as well    POLYPECTOMY     TONSILLECTOMY  1963   TRIGGER FINGER RELEASE  2019    reports that he quit smoking about 25 years ago. His smoking use included cigarettes. He started smoking about 53 years ago. He has a 7 pack-year smoking history. He has never used  smokeless tobacco. He reports that he does not drink alcohol and does not use drugs. family history includes ADD / ADHD in his brother; Alcohol abuse in his son; Arthritis in his father and paternal grandmother; COPD in his father; Cancer in his mother; Clotting disorder in his paternal grandfather and paternal grandmother; Colon polyps in his father; Early death in his brother; Heart disease in his maternal grandmother; Hyperlipidemia in his maternal grandmother; Hypertension in his mother; Liver cancer in his paternal grandfather; Melanoma in his mother. Allergies  Allergen Reactions   Doxycycline  Hyclate Nausea And Vomiting   Latex Other (See Comments)    Other   Tape     Use Paper Tape    Sulfa Antibiotics Rash    Review of Systems  Constitutional:  Negative for chills and fever.  HENT:  Positive for congestion and nosebleeds. Negative for sinus pain and sore throat.   Cardiovascular:  Negative for chest pain.      Objective:     BP 100/60   Pulse 66   Temp 97.8 F (36.6 C) (Oral)   Wt 208 lb 8 oz (94.6 kg)   SpO2 95%   BMI 26.77 kg/m  BP Readings from Last 3 Encounters:  01/05/24 100/60  12/31/23 110/60  12/28/23 120/60  Wt Readings from Last 3 Encounters:  01/05/24 208 lb 8 oz (94.6 kg)  12/31/23 208 lb (94.3 kg)  12/28/23 206 lb 1.6 oz (93.5 kg)      Physical Exam Vitals reviewed.  HENT:     Nose:     Comments: Nasal mucosa reveals no active bleeding.  No blood clots.  He has a few slightly prominent vessels Kiesselbach plexus.  No ulceration or skin lesions noted.  No visible polyps.  No purulent secretions noted.    Mouth/Throat:     Mouth: Mucous membranes are moist.     Pharynx: Oropharynx is clear.  Cardiovascular:     Rate and Rhythm: Normal rate and regular rhythm.  Pulmonary:     Effort: Pulmonary effort is normal.     Breath sounds: Normal breath sounds.  Neurological:     Mental Status: He is alert.      No results found for any visits on  01/05/24.    The ASCVD Risk score (Arnett DK, et al., 2019) failed to calculate for the following reasons:   Risk score cannot be calculated because patient has a medical history suggesting prior/existing ASCVD    Assessment & Plan:   Persistent nasal congestion with recent reported nasal bleeding.  No evidence for bleeding at this time.  Question of whether Repatha was contributing to his congestion.  He is now on Leqvio.  Patient will continue Flonase  and Astelin .  He plans to use humidifier this winter with the drier air  We have recommended follow-up within the next 2 to 3 months to reassess A1c.  He has not had this reassessed since starting Jardiance   [name], MD

## 2024-01-07 ENCOUNTER — Encounter: Payer: Self-pay | Admitting: Podiatry

## 2024-01-07 ENCOUNTER — Ambulatory Visit (INDEPENDENT_AMBULATORY_CARE_PROVIDER_SITE_OTHER): Admitting: Podiatry

## 2024-01-07 DIAGNOSIS — M25572 Pain in left ankle and joints of left foot: Secondary | ICD-10-CM | POA: Diagnosis not present

## 2024-01-07 NOTE — Progress Notes (Signed)
 Presents again complaining of pain on the dorsal lateral aspect of the left foot is marked in ink with the circled area where it is most tender.   Physical exam:  General appearance: Pleasant, and in no acute distress. AOx3.  Vascular: Pedal pulses: DP 2/4 bilaterally, PT 2/4 bilaterally.  Minimal edema lower legs bilaterally. Capillary fill time immediate bilaterally.  Neurological: Grossly intact bilateral  Dermatologic:   Skin normal temperature bilaterally.  Skin normal color, tone, and texture bilaterally.   Musculoskeletal: Tenderness at the 2nd and 3rd navicular cuneiform joints left.   Diagnosis: 1.  Arthralgia 2nd and 3rd navicular cuneiform joint left  Plan: -Discussed the continued pain in the foot.  I think we will try some physical therapy to see if this can give him some benefit.  I do not keep doing injections on these areas. -injected 3cc 2:1 mixture 0.5 cc Marcaine:Kenolog 10mg /63ml at talonavicular joint left.   -Referral to PT evaluate and treat 12 visits chronic foot pain bilaterally  Return as needed

## 2024-01-08 ENCOUNTER — Encounter: Payer: Self-pay | Admitting: Family Medicine

## 2024-01-08 ENCOUNTER — Ambulatory Visit: Admitting: Family Medicine

## 2024-01-08 VITALS — BP 130/64 | HR 62 | Temp 97.5°F | Wt 208.1 lb

## 2024-01-08 DIAGNOSIS — R0981 Nasal congestion: Secondary | ICD-10-CM | POA: Diagnosis not present

## 2024-01-08 DIAGNOSIS — E1165 Type 2 diabetes mellitus with hyperglycemia: Secondary | ICD-10-CM

## 2024-01-08 DIAGNOSIS — Z7984 Long term (current) use of oral hypoglycemic drugs: Secondary | ICD-10-CM

## 2024-01-08 LAB — POCT GLYCOSYLATED HEMOGLOBIN (HGB A1C): Hemoglobin A1C: 6.5 % — AB (ref 4.0–5.6)

## 2024-01-08 NOTE — Patient Instructions (Addendum)
 Try holding the Astelin  for the next week to see if dryness improves.    A1C 6.5%

## 2024-01-08 NOTE — Progress Notes (Signed)
 Established Patient Office Visit  Subjective   Patient ID: Peter Snyder, male    DOB: Dec 30, 1955  Age: 68 y.o. MRN: [id number]  Chief Complaint  Patient presents with   Headache   Epistaxis    HPI   Peter Snyder is seen with some persistent sinus congestion and intermittent headaches.  He had some mild intermittent epistaxis.  Refer to last note for details.  He does remain on Flonase  and Astelin  regularly.  Is seeing ENT in the past.  Denies any purulent drainage.  No fever.  Cough is improved.  No headache currently.  Does have a history of type 2 diabetes.  Recently placed on Jardiance  per cardiology.  He has had multiple steroid injections per podiatry and orthopedics.  Not monitoring blood sugars regularly.  Past Medical History:  Diagnosis Date   Allergy    Allergy to sulfa drugs 12/10/2020   Anxiety 2/98   Arthritis    CAD (coronary artery disease) 08/2012   Cardiac catheterization 6/27/ 2014 ejection fraction 35-40%, 30% proximal left circumflex, 100% tiny obtuse marginal 1 with collaterals, 50% LAD, 50% D1, 100% RCA with collaterals   Chicken pox    Chronic back pain    Colon polyps    Depression    Diabetes mellitus (HCC)    Diet controlled- OFF metformin  02-01-20   E coli infection 12/10/2020   Folliculitis 12/10/2020   GERD (gastroesophageal reflux disease)    Hyperlipidemia    Hypertension    Kidney stone    Myocardial infarction [facility])    ? year- cath 2014/ june    Past Surgical History:  Procedure Laterality Date   CATARACT EXTRACTION  05/2023   COLONOSCOPY     CRYO INTERCOSTAL NERVE BLOCK     double sacrum nerve block   epidural injections     multiple- L5 S1 and cervical neck pain issues as well    POLYPECTOMY     TONSILLECTOMY  1963   TRIGGER FINGER RELEASE  2019    reports that he quit smoking about 25 years ago. His smoking use included cigarettes. He started smoking about 53 years ago. He has a 7 pack-year smoking history. He has never used  smokeless tobacco. He reports that he does not drink alcohol and does not use drugs. family history includes ADD / ADHD in his brother; Alcohol abuse in his son; Arthritis in his father and paternal grandmother; COPD in his father; Cancer in his mother; Clotting disorder in his paternal grandfather and paternal grandmother; Colon polyps in his father; Early death in his brother; Heart disease in his maternal grandmother; Hyperlipidemia in his maternal grandmother; Hypertension in his mother; Liver cancer in his paternal grandfather; Melanoma in his mother. Allergies  Allergen Reactions   Doxycycline  Hyclate Nausea And Vomiting   Latex Other (See Comments)    Other   Tape     Use Paper Tape    Sulfa Antibiotics Rash    Review of Systems  Constitutional:  Negative for chills and fever.  HENT:  Positive for congestion.   Cardiovascular:  Negative for chest pain.  Neurological:  Positive for headaches.      Objective:     BP 130/64   Pulse 62   Temp (!) 97.5 F (36.4 C) (Oral)   Wt 208 lb 1.6 oz (94.4 kg)   SpO2 95%   BMI 26.72 kg/m  BP Readings from Last 3 Encounters:  01/08/24 130/64  01/05/24 100/60  12/31/23 110/60   Wt Readings  from Last 3 Encounters:  01/08/24 208 lb 1.6 oz (94.4 kg)  01/05/24 208 lb 8 oz (94.6 kg)  12/31/23 208 lb (94.3 kg)      Physical Exam Vitals reviewed.  Constitutional:      General: He is not in acute distress.    Appearance: He is not ill-appearing.  HENT:     Nose:     Comments: Nasal mucosa slightly dry.  No active bleeding.  No polyps.  No purulent secretions.    Mouth/Throat:     Mouth: Mucous membranes are moist.     Pharynx: Oropharynx is clear.  Cardiovascular:     Rate and Rhythm: Normal rate and regular rhythm.  Pulmonary:     Effort: Pulmonary effort is normal.     Breath sounds: Normal breath sounds. No wheezing or rales.  Neurological:     Mental Status: He is alert.      Results for orders placed or performed in  visit on 01/08/24  POC HgB A1c  Result Value Ref Range   Hemoglobin A1C 6.5 (A) 4.0 - 5.6 %   HbA1c POC (<> result, manual entry)     HbA1c, POC (prediabetic range)     HbA1c, POC (controlled diabetic range)        The ASCVD Risk score (Arnett DK, et al., 2019) failed to calculate for the following reasons:   Risk score cannot be calculated because patient has a medical history suggesting prior/existing ASCVD    Assessment & Plan:   #1 sinus congestion/irritation.  He is complaining of some sinus dryness more than anything.  Intermittent headaches.  No purulent secretions.  Try leaving off Astelin  nasal for the next week.  Continue saline nasal and humidification of air at night.  No indication for antibiotic at this time  #2 type 2 diabetes.  A1c today stable 6.5%.  Somewhat surprisingly this did not go down on Jardiance  although he has had multiple steroid injections which are probably contributing to this.  Reassess A1c in 3 to 4 months   [name], MD

## 2024-01-11 ENCOUNTER — Encounter: Payer: Self-pay | Admitting: Podiatry

## 2024-01-12 ENCOUNTER — Ambulatory Visit (INDEPENDENT_AMBULATORY_CARE_PROVIDER_SITE_OTHER)

## 2024-01-12 ENCOUNTER — Telehealth: Payer: Self-pay | Admitting: Podiatry

## 2024-01-12 VITALS — BP 151/80 | HR 69 | Temp 98.7°F | Resp 16 | Ht 74.0 in | Wt 209.0 lb

## 2024-01-12 DIAGNOSIS — E785 Hyperlipidemia, unspecified: Secondary | ICD-10-CM

## 2024-01-12 MED ORDER — INCLISIRAN SODIUM 284 MG/1.5ML ~~LOC~~ SOSY
284.0000 mg | PREFILLED_SYRINGE | Freq: Once | SUBCUTANEOUS | Status: AC
  Administered 2024-01-12: 284 mg via SUBCUTANEOUS
  Filled 2024-01-12: qty 1.5

## 2024-01-12 NOTE — Progress Notes (Signed)
 Diagnosis: Hyperlipidemia  Provider:  Chilton Greathouse MD  Procedure: Injection  Leqvio (inclisiran), Dose: 284 mg, Site: subcutaneous, Number of injections: 1  Injection Site(s): Right arm  Post Care: Observation period completed  Discharge: Condition: Good, Destination: Home . AVS Provided  Performed by:  Wyvonne Lenz, RN

## 2024-01-12 NOTE — Telephone Encounter (Signed)
 Patient came in yesterday and stated he was supposed to have P.t. Referral sent to [facility] PT. He said he has checked with them and they do not have the order yet and his appointment is Friday. Their fax number is [phone].

## 2024-01-24 ENCOUNTER — Encounter: Payer: Self-pay | Admitting: Pharmacist Clinician (PhC)/ Clinical Pharmacy Specialist

## 2024-01-25 ENCOUNTER — Ambulatory Visit (INDEPENDENT_AMBULATORY_CARE_PROVIDER_SITE_OTHER): Admitting: Family Medicine

## 2024-01-25 ENCOUNTER — Encounter: Payer: Self-pay | Admitting: Family Medicine

## 2024-01-25 VITALS — BP 110/60 | HR 64 | Temp 97.7°F | Wt 207.3 lb

## 2024-01-25 DIAGNOSIS — S50311A Abrasion of right elbow, initial encounter: Secondary | ICD-10-CM

## 2024-01-25 NOTE — Progress Notes (Signed)
 Established Patient Office Visit  Subjective   Patient ID: Peter Snyder, male    DOB: 1955-11-15  Age: 68 y.o. MRN: [id number]  Chief Complaint  Patient presents with   Skin Problem    HPI   Peter Snyder is seen with small wound right elbow region over the olecranon process.  Denies any injury.  First noted a few days ago.  Recently started Leqvio injection per cardiology and had questions whether this could be related.  No other skin rash noted.  Peter Snyder has been cleaning with soap and water and applying Vaseline topically.  Past Medical History:  Diagnosis Date   Allergy    Allergy to sulfa drugs 12/10/2020   Anxiety 2/98   Arthritis    CAD (coronary artery disease) 08/2012   Cardiac catheterization 6/27/ 2014 ejection fraction 35-40%, 30% proximal left circumflex, 100% tiny obtuse marginal 1 with collaterals, 50% LAD, 50% D1, 100% RCA with collaterals   Chicken pox    Chronic back pain    Colon polyps    Depression    Diabetes mellitus (HCC)    Diet controlled- OFF metformin  02-01-20   E coli infection 12/10/2020   Folliculitis 12/10/2020   GERD (gastroesophageal reflux disease)    Hyperlipidemia    Hypertension    Kidney stone    Myocardial infarction [facility])    ? year- cath 2014/ june    Past Surgical History:  Procedure Laterality Date   CATARACT EXTRACTION  05/2023   COLONOSCOPY     CRYO INTERCOSTAL NERVE BLOCK     double sacrum nerve block   epidural injections     multiple- L5 S1 and cervical neck pain issues as well    POLYPECTOMY     TONSILLECTOMY  1963   TRIGGER FINGER RELEASE  2019    reports that Peter Snyder quit smoking about 25 years ago. His smoking use included cigarettes. Peter Snyder started smoking about 53 years ago. Peter Snyder has a 7 pack-year smoking history. Peter Snyder has never used smokeless tobacco. Peter Snyder reports that Peter Snyder does not drink alcohol and does not use drugs. family history includes ADD / ADHD in his brother; Alcohol abuse in his son; Arthritis in his father and paternal  grandmother; COPD in his father; Cancer in his mother; Clotting disorder in his paternal grandfather and paternal grandmother; Colon polyps in his father; Early death in his brother; Heart disease in his maternal grandmother; Hyperlipidemia in his maternal grandmother; Hypertension in his mother; Liver cancer in his paternal grandfather; Melanoma in his mother. Allergies  Allergen Reactions   Doxycycline  Hyclate Nausea And Vomiting   Latex Other (See Comments)    Other   Tape     Use Paper Tape    Sulfa Antibiotics Rash    Review of Systems  Constitutional:  Negative for chills and fever.      Objective:     BP 110/60   Pulse 64   Temp 97.7 F (36.5 C) (Oral)   Wt 207 lb 4.8 oz (94 kg)   SpO2 97%   BMI 26.62 kg/m  BP Readings from Last 3 Encounters:  01/25/24 110/60  01/12/24 (!) 151/80  01/08/24 130/64   Wt Readings from Last 3 Encounters:  01/25/24 207 lb 4.8 oz (94 kg)  01/12/24 209 lb (94.8 kg)  01/08/24 208 lb 1.6 oz (94.4 kg)      Physical Exam Vitals reviewed.  Constitutional:      General: Peter Snyder is not in acute distress.    Appearance: Peter Snyder  is not ill-appearing.  Cardiovascular:     Rate and Rhythm: Normal rate and regular rhythm.  Pulmonary:     Effort: Pulmonary effort is normal.     Breath sounds: Normal breath sounds.  Skin:    Comments: Very small eschar about 2 mm diameter right elbow over the olecranon process.  No surrounding erythema.  No fluctuance.  No pustule.  No warmth.  Neurological:     Mental Status: Peter Snyder is alert.      No results found for any visits on 01/25/24.    The ASCVD Risk score (Arnett DK, et al., 2019) failed to calculate for the following reasons:   Risk score cannot be calculated because patient has a medical history suggesting prior/existing ASCVD    Assessment & Plan:   Small abrasion right elbow.  No evidence for olecranon bursitis.  No abscess.  No cellulitis changes.  Reassurance given.  Continue to clean daily  with soap and water.  Continue topical Vaseline.  Follow-up for any redness, warmth, or swelling.   [name], MD

## 2024-01-25 NOTE — Patient Instructions (Signed)
 Continue to clean wound daily with soap and water  Continue with small amount of vaseline  Follow up for any redness, warmth, or swelling.

## 2024-01-31 ENCOUNTER — Other Ambulatory Visit: Payer: Self-pay | Admitting: Family Medicine

## 2024-02-01 ENCOUNTER — Ambulatory Visit: Admitting: Family Medicine

## 2024-02-01 ENCOUNTER — Encounter: Payer: Self-pay | Admitting: Family Medicine

## 2024-02-01 VITALS — BP 126/64 | HR 71 | Temp 98.0°F | Wt 207.7 lb

## 2024-02-01 DIAGNOSIS — K649 Unspecified hemorrhoids: Secondary | ICD-10-CM | POA: Diagnosis not present

## 2024-02-01 NOTE — Progress Notes (Signed)
 Established Patient Office Visit  Subjective   Patient ID: Peter Snyder, male    DOB: [birth year]  Age: 68 y.o. MRN: [id number]  Chief Complaint  Patient presents with   Hemorrhoids    HPI   Kash is seen with concern for some recent hemorrhoid issues.  He has a longstanding history of hemorrhoids with occasional bright red bleeding per rectum.  He had some recent straining and occasional constipation.  Tries to eat a lot of fiber.  He walks regularly.  Drinks a lot of fluids.  No recent appetite or weight changes.  Last colonoscopy 2/22 with recommended 5-year follow-up.  Has had some mild discomfort recently with bowel movements.  No severe pain.  Past Medical History:  Diagnosis Date   Allergy    Allergy to sulfa drugs 12/10/2020   Anxiety 2/98   Arthritis    CAD (coronary artery disease) 08/2012   Cardiac catheterization 6/27/ 2014 ejection fraction 35-40%, 30% proximal left circumflex, 100% tiny obtuse marginal 1 with collaterals, 50% LAD, 50% D1, 100% RCA with collaterals   Chicken pox    Chronic back pain    Colon polyps    Depression    Diabetes mellitus (HCC)    Diet controlled- OFF metformin  02-01-20   E coli infection 12/10/2020   Folliculitis 12/10/2020   GERD (gastroesophageal reflux disease)    Hyperlipidemia    Hypertension    Kidney stone    Myocardial infarction [facility])    ? year- cath 2014/ june    Past Surgical History:  Procedure Laterality Date   CATARACT EXTRACTION  05/2023   COLONOSCOPY     CRYO INTERCOSTAL NERVE BLOCK     double sacrum nerve block   epidural injections     multiple- L5 S1 and cervical neck pain issues as well    POLYPECTOMY     TONSILLECTOMY  1963   TRIGGER FINGER RELEASE  2019    reports that he quit smoking about 25 years ago. His smoking use included cigarettes. He started smoking about 53 years ago. He has a 7 pack-year smoking history. He has never used smokeless tobacco. He reports that he does not drink alcohol and does  not use drugs. family history includes ADD / ADHD in his brother; Alcohol abuse in his son; Arthritis in his father and paternal grandmother; COPD in his father; Cancer in his mother; Clotting disorder in his paternal grandfather and paternal grandmother; Colon polyps in his father; Early death in his brother; Heart disease in his maternal grandmother; Hyperlipidemia in his maternal grandmother; Hypertension in his mother; Liver cancer in his paternal grandfather; Melanoma in his mother. Allergies  Allergen Reactions   Doxycycline  Hyclate Nausea And Vomiting   Latex Other (See Comments)    Other   Tape     Use Paper Tape    Sulfa Antibiotics Rash    Review of Systems  Constitutional:  Negative for chills, fever and weight loss.  Gastrointestinal:  Negative for abdominal pain, diarrhea and melena.      Objective:     BP 126/64   Pulse 71   Temp 98 F (36.7 C) (Oral)   Wt 207 lb 11.2 oz (94.2 kg)   SpO2 97%   BMI 26.67 kg/m  BP Readings from Last 3 Encounters:  02/01/24 126/64  01/25/24 110/60  01/12/24 (!) 151/80   Wt Readings from Last 3 Encounters:  02/01/24 207 lb 11.2 oz (94.2 kg)  01/25/24 207 lb 4.8 oz (94 kg)  01/12/24 209 lb (94.8 kg)      Physical Exam Vitals reviewed.  Constitutional:      General: He is not in acute distress.    Appearance: He is not ill-appearing.  Cardiovascular:     Rate and Rhythm: Normal rate and regular rhythm.  Pulmonary:     Effort: Pulmonary effort is normal.     Breath sounds: Normal breath sounds.  Genitourinary:    Comments: Rectal exam reveals possible very small fissure around 12 o'clock position.  No active bleeding.  He has some small hemorrhoids near the anal verge.  No active bleeding.  No thrombosis.  Digital exam reveals no rectal mass. Neurological:     Mental Status: He is alert.      No results found for any visits on 02/01/24.    The ASCVD Risk score (Arnett DK, et al., 2019) failed to calculate for the  following reasons:   Risk score cannot be calculated because patient has a medical history suggesting prior/existing ASCVD    Assessment & Plan:   Hemorrhoids.  Reiterated the importance of plenty of fluids, regular exercise, at least 30 g fiber per day, addition of stool softeners if needed.  Follow-up for any recurrent bleeding or other concerns.  [name], MD

## 2024-02-10 ENCOUNTER — Ambulatory Visit: Admitting: Family Medicine

## 2024-02-10 VITALS — BP 106/56 | HR 65 | Temp 98.2°F | Ht 74.0 in | Wt 208.0 lb

## 2024-02-10 DIAGNOSIS — L219 Seborrheic dermatitis, unspecified: Secondary | ICD-10-CM | POA: Diagnosis not present

## 2024-02-10 MED ORDER — CLOBETASOL PROPIONATE 0.05 % EX SHAM: 1.0000 | MEDICATED_SHAMPOO | Freq: Two times a day (BID) | CUTANEOUS | 1 refills | Status: AC

## 2024-02-10 NOTE — Progress Notes (Signed)
 Established Patient Office Visit  Subjective   Patient ID: Peter Snyder, male    DOB: 1955-08-14  Age: 68 y.o. MRN: [id number]  Chief Complaint  Patient presents with   Hair/Scalp Problem    States he has tried head and shoulders and it burnt even with celsus blue. Needing advice to manage this.     HPI   Peter Snyder is seen with some scalp irritation and pruritus.  He has history of seborrheic dermatitis.  He states that his 4 year old son has had addiction issues and recently had relapse.  This has caused tremendous amount of stress.  He has found in the past that his seborrheic dermatitis symptoms seem to be a lot worse during times of stress.  He sometimes has some anterior chest involvement as well.  Has tried Selsun Blue and head and shoulders shampoo but they seem to irritate scalp worse.  Past Medical History:  Diagnosis Date   Allergy    Allergy to sulfa drugs 12/10/2020   Anxiety 2/98   Arthritis    CAD (coronary artery disease) 08/2012   Cardiac catheterization 6/27/ 2014 ejection fraction 35-40%, 30% proximal left circumflex, 100% tiny obtuse marginal 1 with collaterals, 50% LAD, 50% D1, 100% RCA with collaterals   Chicken pox    Chronic back pain    Colon polyps    Depression    Diabetes mellitus (HCC)    Diet controlled- OFF metformin  02-01-20   E coli infection 12/10/2020   Folliculitis 12/10/2020   GERD (gastroesophageal reflux disease)    Hyperlipidemia    Hypertension    Kidney stone    Myocardial infarction [facility])    ? year- cath 2014/ june    Past Surgical History:  Procedure Laterality Date   CATARACT EXTRACTION  05/2023   COLONOSCOPY     CRYO INTERCOSTAL NERVE BLOCK     double sacrum nerve block   epidural injections     multiple- L5 S1 and cervical neck pain issues as well    POLYPECTOMY     TONSILLECTOMY  1963   TRIGGER FINGER RELEASE  2019    reports that he quit smoking about 25 years ago. His smoking use included cigarettes. He started  smoking about 53 years ago. He has a 7 pack-year smoking history. He has never used smokeless tobacco. He reports that he does not drink alcohol and does not use drugs. family history includes ADD / ADHD in his brother; Alcohol abuse in his son; Arthritis in his father and paternal grandmother; COPD in his father; Cancer in his mother; Clotting disorder in his paternal grandfather and paternal grandmother; Colon polyps in his father; Early death in his brother; Heart disease in his maternal grandmother; Hyperlipidemia in his maternal grandmother; Hypertension in his mother; Liver cancer in his paternal grandfather; Melanoma in his mother. Allergies  Allergen Reactions   Doxycycline  Hyclate Nausea And Vomiting   Latex Other (See Comments)    Other   Tape     Use Paper Tape    Sulfa Antibiotics Rash    Review of Systems  Constitutional:  Negative for chills and fever.  Skin:  Positive for itching and rash.      Objective:     BP (!) 106/56   Pulse 65   Temp 98.2 F (36.8 C) (Oral)   Ht 6' 2 (1.88 m)   Wt 208 lb (94.3 kg)   SpO2 97%   BMI 26.71 kg/m  BP Readings from Last 3 Encounters:  02/10/24 (!) 106/56  02/01/24 126/64  01/25/24 110/60   Wt Readings from Last 3 Encounters:  02/10/24 208 lb (94.3 kg)  02/01/24 207 lb 11.2 oz (94.2 kg)  01/25/24 207 lb 4.8 oz (94 kg)      Physical Exam Vitals reviewed.  Constitutional:      General: He is not in acute distress.    Appearance: He is not ill-appearing.  Cardiovascular:     Rate and Rhythm: Normal rate and regular rhythm.  Skin:    Comments: Scalp reveals no active scaling.  He has a few small erythematous patches.  He has a couple of scaly erythematous patches on the anterior chest.  Neurological:     Mental Status: He is alert.      No results found for any visits on 02/10/24.    The ASCVD Risk score (Arnett DK, et al., 2019) failed to calculate for the following reasons:   Risk score cannot be calculated  because patient has a medical history suggesting prior/existing ASCVD    Assessment & Plan:   Seborrheic dermatitis.  He has history of scalp involvement.  Has not gotten great relief with anti-seborrhea shampoos in the past..  We wrote for clobetasol  shampoo to use once or twice weekly as needed if not getting relief with over-the-counter shampoos.   [name], MD

## 2024-02-15 ENCOUNTER — Other Ambulatory Visit: Payer: Self-pay | Admitting: Cardiology

## 2024-02-16 ENCOUNTER — Ambulatory Visit: Admitting: Podiatry

## 2024-02-16 ENCOUNTER — Encounter: Payer: Self-pay | Admitting: Cardiology

## 2024-02-16 ENCOUNTER — Encounter: Payer: Self-pay | Admitting: Podiatry

## 2024-02-16 DIAGNOSIS — M25571 Pain in right ankle and joints of right foot: Secondary | ICD-10-CM

## 2024-02-16 MED ORDER — TRIAMCINOLONE ACETONIDE 40 MG/ML IJ SUSP
40.0000 mg | Freq: Once | INTRAMUSCULAR | Status: AC
  Administered 2024-02-16: 40 mg

## 2024-02-16 NOTE — Progress Notes (Signed)
 Patient presents complaint of pain at the 5th and 1st phalangeal joint of the right foot.  Been bothering her for about a week or 2 now.  Has not noticed any redness or ecchymosis to it and does not recall any injury to it.  Also complains of a little piece of nail growing on the hallux right on the medial border that is causing discomfort and pain.   Physical exam:  General appearance: Pleasant, and in no acute distress. AOx3.  Vascular: Pedal pulses: DP 2/4 bilaterally, PT 2/4 bilaterally.  Mild edema lower legs bilaterally. Capillary fill time immediate bilaterally.  Neurological: Grossly intact bilaterally  Dermatologic:   Skin normal temperature bilaterally.  Skin normal color, tone, and texture bilaterally.  No ecchymosis or erythema noted.  Musculoskeletal: Tenderness with palpation and range of motion fifth metatarsal phalangeal joint right foot.  No crepitation noted with range of motion.    Diagnosis: 1.  Arthralgia fifth MTP right  Plan: -Recommend injection of the fifth MTP right today.  This is started bothering him.  Also discussed some nail spicule in the hallux right we can possibly do matrixectomy on this on his next visit. -injected 2cc mixture of 1cc 0.5% Marcaine, 1cc triamcinolone  40 mg / 1 mL at fifth met phalangeal joint right.     Return 2 weeks follow-up injection fifth MTP right and possible matrixectomy nail spicule medial border hallux right

## 2024-02-17 ENCOUNTER — Telehealth: Payer: Self-pay | Admitting: Cardiology

## 2024-02-17 MED ORDER — SACUBITRIL-VALSARTAN 49-51 MG PO TABS: 1.0000 | ORAL_TABLET | Freq: Two times a day (BID) | ORAL | 3 refills | Status: AC

## 2024-02-17 NOTE — Telephone Encounter (Signed)
*  STAT* If patient is at the pharmacy, call can be transferred to refill team.   1. Which medications need to be refilled? (please list name of each medication and dose if known) Entresto  49mg    2. Would you like to learn more about the convenience, safety, & potential cost savings by using the [facility]? no   3. Are you open to using the [facility] (Type [facility].  ).No   4. Which pharmacy/location (including street and city if local pharmacy) is medication to be sent to? [facility] [address]   5. Do they need a 30 day or 90 day supply? 90 day

## 2024-02-17 NOTE — Telephone Encounter (Signed)
 Requested Prescriptions   Signed Prescriptions Disp Refills   sacubitril -valsartan  (ENTRESTO ) 49-51 MG 180 tablet 3    Sig: Take 1 tablet by mouth 2 (two) times daily.    Authorizing Provider: PIETRO REDELL RAMAN    Ordering User: [email or web address], Rogina Schiano  C

## 2024-02-22 ENCOUNTER — Ambulatory Visit: Admitting: Family Medicine

## 2024-02-22 ENCOUNTER — Encounter: Payer: Self-pay | Admitting: Family Medicine

## 2024-02-22 VITALS — BP 110/60 | HR 66 | Temp 98.3°F | Wt 206.1 lb

## 2024-02-22 DIAGNOSIS — R0981 Nasal congestion: Secondary | ICD-10-CM

## 2024-02-22 DIAGNOSIS — K6289 Other specified diseases of anus and rectum: Secondary | ICD-10-CM

## 2024-02-22 DIAGNOSIS — R058 Other specified cough: Secondary | ICD-10-CM

## 2024-02-22 MED ORDER — BENZONATATE 200 MG PO CAPS: 200.0000 mg | ORAL_CAPSULE | Freq: Three times a day (TID) | ORAL | 0 refills | Status: AC | PRN

## 2024-02-22 NOTE — Progress Notes (Signed)
 Established Patient Office Visit  Subjective   Patient ID: Peter Snyder, male    DOB: 12-03-1955  Age: 68 y.o. MRN: [id number]  Chief Complaint  Patient presents with   Hemorrhoids    HPI   Custer has longstanding history of perianal irritation.  Does have history of hemorrhoids.  He had some constipation last week and a small amount of bright blood with wiping couple times.  Last colonoscopy was 2/22.  He used some Aquaphor which seemed to help.  Tried triamcinolone  and had some mild burning.  Constipation is better at this point with Metamucil and also using stool softener as needed.  He has had a bit of clear nasal discharge.  Takes Flonase  regularly.  Has been on Singulair  in the past but not currently.  No recent facial pain or fever.  Past Medical History:  Diagnosis Date   Allergy    Allergy to sulfa drugs 12/10/2020   Anxiety 2/98   Arthritis    CAD (coronary artery disease) 08/2012   Cardiac catheterization 6/27/ 2014 ejection fraction 35-40%, 30% proximal left circumflex, 100% tiny obtuse marginal 1 with collaterals, 50% LAD, 50% D1, 100% RCA with collaterals   Chicken pox    Chronic back pain    Colon polyps    Depression    Diabetes mellitus (HCC)    Diet controlled- OFF metformin  02-01-20   E coli infection 12/10/2020   Folliculitis 12/10/2020   GERD (gastroesophageal reflux disease)    Hyperlipidemia    Hypertension    Kidney stone    Myocardial infarction [facility])    ? year- cath 2014/ june    Past Surgical History:  Procedure Laterality Date   CATARACT EXTRACTION  05/2023   COLONOSCOPY     CRYO INTERCOSTAL NERVE BLOCK     double sacrum nerve block   epidural injections     multiple- L5 S1 and cervical neck pain issues as well    POLYPECTOMY     TONSILLECTOMY  1963   TRIGGER FINGER RELEASE  2019    reports that he quit smoking about 25 years ago. His smoking use included cigarettes. He started smoking about 53 years ago. He has a 7 pack-year smoking  history. He has never used smokeless tobacco. He reports that he does not drink alcohol and does not use drugs. family history includes ADD / ADHD in his brother; Alcohol abuse in his son; Arthritis in his father and paternal grandmother; COPD in his father; Cancer in his mother; Clotting disorder in his paternal grandfather and paternal grandmother; Colon polyps in his father; Early death in his brother; Heart disease in his maternal grandmother; Hyperlipidemia in his maternal grandmother; Hypertension in his mother; Liver cancer in his paternal grandfather; Melanoma in his mother. Allergies  Allergen Reactions   Doxycycline  Hyclate Nausea And Vomiting   Latex Other (See Comments)    Other   Tape     Use Paper Tape    Sulfa Antibiotics Rash    Review of Systems  Constitutional:  Negative for chills and fever.  Gastrointestinal:  Positive for constipation. Negative for abdominal pain.      Objective:     BP 110/60   Pulse 66   Temp 98.3 F (36.8 C) (Oral)   Wt 206 lb 1.6 oz (93.5 kg)   SpO2 95%   BMI 26.46 kg/m  BP Readings from Last 3 Encounters:  02/22/24 110/60  02/10/24 (!) 106/56  02/01/24 126/64   Wt Readings from Last  3 Encounters:  02/22/24 206 lb 1.6 oz (93.5 kg)  02/10/24 208 lb (94.3 kg)  02/01/24 207 lb 11.2 oz (94.2 kg)      Physical Exam Vitals reviewed.  Constitutional:      General: He is not in acute distress.    Appearance: He is not ill-appearing.  Cardiovascular:     Rate and Rhythm: Normal rate and regular rhythm.  Pulmonary:     Effort: Pulmonary effort is normal.     Breath sounds: Normal breath sounds. No wheezing or rales.  Genitourinary:    Comments: Anal exam reveals very small fissure around the 12 o'clock position.  No active bleeding.  No visible external hemorrhoids.  No significant rash. Neurological:     Mental Status: He is alert.      No results found for any visits on 02/22/24.    The ASCVD Risk score (Arnett DK, et  al., 2019) failed to calculate for the following reasons:   Risk score cannot be calculated because patient has a medical history suggesting prior/existing ASCVD    Assessment & Plan:   #1 perianal irritation.  Very small fissure with history of recent constipation.  Continue measures to reduce constipation.  Continue Aquaphor as needed.  Follow-up for any progressive pain or other concerns  #2 nasal congestion.  Suspect allergic predominantly.  Continue Flonase .  Add back Singulair  if needed  #3 intermittent dry cough probably related to #2.  He has used Tessalon  in the past and requesting refill.  This was provided.   No follow-ups on file.    [name], MD

## 2024-02-24 ENCOUNTER — Encounter: Payer: Self-pay | Admitting: Family Medicine

## 2024-02-24 ENCOUNTER — Ambulatory Visit: Admitting: Podiatry

## 2024-02-24 DIAGNOSIS — L600 Ingrowing nail: Secondary | ICD-10-CM | POA: Diagnosis not present

## 2024-02-24 NOTE — Progress Notes (Signed)
 Patient complains of painful ingrown nail spicule medial border hallux nail right.  Has been bothering him for several years now and is bothering him more and more.  Notices redness around the nail edge.  Gets pain with it.  Doing well at the fifth MTP on the right which was injected last visit.. Patient denies fevers, chills, nausea, vomiting.  Objective:  Vitals: Reviewed  General: Well developed, nourished, in no acute distress, alert and oriented x3   Vascular: DP pulse 2/4 bilateral. PT pulse 2/4 bilateral.  Mild edema toe with ingrown nail.  Capillary refill time immediate bilaterally  Dermatology: Erythema, edema, incurvated nail border medial border hallux right with no drainage . Tenderness present with palpation.  Redness on nail fold with regrowth of spicule.  Normal skin tone and texture feet with normal hair growth.  Neurological: Grossly intact. Normal reflexes.   Musculoskeletal: Tenderness with palpation of the distal hallux right. No tenderness or painful ROM at IPJ.  Diagnosis: 1.  Ingrown nail medial border hallux nail right  Plan: -discussed etiology and treatment of ingrown nails. Discussed surgical vs conservative treatment. -Consent signed for appropriate matrixectomy affected nail(s).   Procedure(s):   - Matrixectomy(s) medial border hallux nail right: Toe anesthetized with 3cc 2:1 mixture 2% Lidocaine  with epinephrine : Sodium Bicarbonate. Surgical site prepped. Digital tourniquet applied.  Avulsion of nail border. performed. Matrixecomy performed with three 30 second applications of phenol to nail matrix. Site irrigated with alcohol.  Tourniquet released with good vascularity noticed in digit.  Applied triple antibiotic to nailbed and applied gauze and Coban dressing. - Written and oral postoperative instructions given.  -Return for post-op 2 weeks.  JINNY Prentice Binder, DPM

## 2024-02-24 NOTE — Patient Instructions (Signed)

## 2024-02-29 ENCOUNTER — Encounter: Payer: Self-pay | Admitting: Family Medicine

## 2024-02-29 ENCOUNTER — Ambulatory Visit: Admitting: Family Medicine

## 2024-02-29 VITALS — BP 106/60 | HR 61 | Temp 98.3°F | Wt 205.6 lb

## 2024-02-29 DIAGNOSIS — R058 Other specified cough: Secondary | ICD-10-CM | POA: Diagnosis not present

## 2024-02-29 DIAGNOSIS — K6289 Other specified diseases of anus and rectum: Secondary | ICD-10-CM

## 2024-02-29 NOTE — Progress Notes (Signed)
 Established Patient Office Visit  Subjective   Patient ID: Peter Snyder, male    DOB: Aug 07, 1955  Age: 68 y.o. MRN: [id number]  Chief Complaint  Patient presents with   Hemorrhoids    HPI   Peter Snyder is seen with some ongoing mild perianal irritation.  Had been recently battling some constipation and after taking 2 stool softeners daily with fiber developed slightly loose stool.  Currently on 1 stool softener.  He has longstanding history of perianal pruritus and irritation.  No recent bleeding since last visit.  He has had some itching and mild discomfort.  Has been alternating between Aquaphor and triamcinolone .  Also relates some intermittent dry cough past few days.  No fever.  No wheezing.  No shortness of breath.  He was unable to exercise as much as usual last week because of several commitments.  Past Medical History:  Diagnosis Date   Allergy    Allergy to sulfa drugs 12/10/2020   Anxiety 2/98   Arthritis    CAD (coronary artery disease) 08/2012   Cardiac catheterization 6/27/ 2014 ejection fraction 35-40%, 30% proximal left circumflex, 100% tiny obtuse marginal 1 with collaterals, 50% LAD, 50% D1, 100% RCA with collaterals   Chicken pox    Chronic back pain    Colon polyps    Depression    Diabetes mellitus (HCC)    Diet controlled- OFF metformin  02-01-20   E coli infection 12/10/2020   Folliculitis 12/10/2020   GERD (gastroesophageal reflux disease)    Hyperlipidemia    Hypertension    Kidney stone    Myocardial infarction [facility])    ? year- cath 2014/ june    Past Surgical History:  Procedure Laterality Date   CATARACT EXTRACTION  05/2023   COLONOSCOPY     CRYO INTERCOSTAL NERVE BLOCK     double sacrum nerve block   epidural injections     multiple- L5 S1 and cervical neck pain issues as well    POLYPECTOMY     TONSILLECTOMY  1963   TRIGGER FINGER RELEASE  2019    reports that he quit smoking about 25 years ago. His smoking use included cigarettes. He  started smoking about 53 years ago. He has a 7 pack-year smoking history. He has never used smokeless tobacco. He reports that he does not drink alcohol and does not use drugs. family history includes ADD / ADHD in his brother; Alcohol abuse in his son; Arthritis in his father and paternal grandmother; COPD in his father; Cancer in his mother; Clotting disorder in his paternal grandfather and paternal grandmother; Colon polyps in his father; Early death in his brother; Heart disease in his maternal grandmother; Hyperlipidemia in his maternal grandmother; Hypertension in his mother; Liver cancer in his paternal grandfather; Melanoma in his mother. Allergies[1]  Review of Systems  Constitutional:  Negative for chills and fever.  Respiratory:  Positive for cough. Negative for hemoptysis, sputum production, shortness of breath and wheezing.   Cardiovascular:  Negative for chest pain.  Gastrointestinal:  Positive for constipation. Negative for blood in stool, diarrhea, melena, nausea and vomiting.      Objective:     BP 106/60   Pulse 61   Temp 98.3 F (36.8 C) (Oral)   Wt 205 lb 9.6 oz (93.3 kg)   SpO2 96%   BMI 26.40 kg/m  BP Readings from Last 3 Encounters:  02/29/24 106/60  02/22/24 110/60  02/10/24 (!) 106/56   Wt Readings from Last 3 Encounters:  02/29/24 205 lb 9.6 oz (93.3 kg)  02/22/24 206 lb 1.6 oz (93.5 kg)  02/10/24 208 lb (94.3 kg)      Physical Exam Vitals reviewed.  Constitutional:      General: He is not in acute distress.    Appearance: He is not ill-appearing.  Cardiovascular:     Rate and Rhythm: Normal rate and regular rhythm.  Pulmonary:     Effort: Pulmonary effort is normal.     Breath sounds: Normal breath sounds. No wheezing or rales.  Genitourinary:    Comments: .  Anal region is examined.  Has some small nonthrombosed and noninflamed appearing external hemorrhoids.  No significant erythema. Neurological:     Mental Status: He is alert.       No results found for any visits on 02/29/24.    The ASCVD Risk score (Arnett DK, et al., 2019) failed to calculate for the following reasons:   Risk score cannot be calculated because patient has a medical history suggesting prior/existing ASCVD   * - Cholesterol units were assumed    Assessment & Plan:   #1 perianal irritation.  He has some recent constipation which is improved with stool softener use and fiber supplement.  Recently noticed small fissure around 12 o'clock position which appears to be healing.  He complains of some pruritus and mild discomfort but mostly pruritus.  We recommend triamcinolone  cream twice daily for a few days then back off.  #2 dry cough-normal lung exam.  Reassurance.  Follow-up for any fever, shortness of breath, or progressive cough   [name], MD     [1]  Allergies Allergen Reactions   Doxycycline  Hyclate Nausea And Vomiting   Latex Other (See Comments)    Other   Tape     Use Paper Tape    Sulfa Antibiotics Rash

## 2024-03-01 ENCOUNTER — Ambulatory Visit: Admitting: Podiatry

## 2024-03-02 ENCOUNTER — Encounter: Payer: Self-pay | Admitting: Family Medicine

## 2024-03-03 ENCOUNTER — Other Ambulatory Visit: Payer: Self-pay | Admitting: Family Medicine

## 2024-03-04 ENCOUNTER — Ambulatory Visit: Admitting: Podiatry

## 2024-03-04 DIAGNOSIS — L600 Ingrowing nail: Secondary | ICD-10-CM

## 2024-03-04 NOTE — Progress Notes (Signed)
 Patient presents follow-up nail surgery toe.  No complaints.  Physical exam:  Dermatologic: Nail surgery site for matrixectomy healing well with no signs of infection.  Diagnosis: 1.  Status post matrixectomy toe.  Healing well  Plan: - POV status post nail surgery , if patient has any problems she can call for appointment otherwise we can see her as needed  - Return as needed

## 2024-03-22 ENCOUNTER — Encounter: Payer: Self-pay | Admitting: Family Medicine

## 2024-03-22 ENCOUNTER — Ambulatory Visit (INDEPENDENT_AMBULATORY_CARE_PROVIDER_SITE_OTHER): Admitting: Family Medicine

## 2024-03-22 VITALS — BP 104/60 | HR 63 | Temp 98.2°F | Wt 206.7 lb

## 2024-03-22 DIAGNOSIS — K5900 Constipation, unspecified: Secondary | ICD-10-CM

## 2024-03-22 DIAGNOSIS — S91301A Unspecified open wound, right foot, initial encounter: Secondary | ICD-10-CM

## 2024-03-22 NOTE — Progress Notes (Signed)
 Presents 4 weeks status post nail surgery hallux right.  Was concerned about area that is getting tender and at the proximal nail fold and he still getting some scabbing.  Slight clear drainage on the bandage.  He has been cleaning with soapy water and putting some Vaseline on it   Physical exam:  General appearance: Pleasant, and in no acute distress. AOx3.  Vascular: Pedal pulses: DP 2/4 bilaterally, PT 2/4 bilaterally.  Minimal edema lower legs bilaterally. Capillary fill time immediate bilaterally.  Neurological: Grossly intact bilaterally  Dermatologic:   3 x 5 superficial wound on the nail surgery site at the proximal nail fold.  Some crusting.  Slight serous drainage.  No signs of infection.  Skin normal temperature bilaterally.  Skin normal color, tone, and texture bilaterally.   Musculoskeletal:   Diagnosis: Slow healing wound right foot  Plan: -Established office visit for evaluation and management level 2 - Wound appears to be healing with no signs of infection.  Distal bruising where the nail was removed.  This should gradually resolve wound itself is almost healed at this point.  No signs of infection discontinue soaking twice daily with lukewarm salt water he can apply a little bit of Neosporin or Vaseline to it.  Should be healed another 3 weeks if not call   Return as needed

## 2024-03-22 NOTE — Progress Notes (Signed)
 "  Established Patient Office Visit  Subjective   Patient ID: Peter Snyder, male    DOB: 10-Aug-1955  Age: 69 y.o. MRN: [id number]  Chief Complaint  Patient presents with   Hemorrhoids    HPI   Tyce is seen with ongoing issues of intermittent constipation.  He increase stool softener to 2 a day and had loose stools but does not seem to get adequate relief with 1 stool softener.  He thinks symptoms of constipation may have worsened since starting Jardiance  per cardiology.  He apparently has not been using any MiraLAX recently.  Eats a fair amount of fiber.  Also does Metamucil supplement.  Exercises regularly.  Occasional bright red blood per rectum.  Last colonoscopy 2/22.  History of known hemorrhoids.  No recent pain with stools.  Appetite and weight stable.  Past Medical History:  Diagnosis Date   Allergy    Allergy to sulfa drugs 12/10/2020   Anxiety 2/98   Arthritis    CAD (coronary artery disease) 08/2012   Cardiac catheterization 6/27/ 2014 ejection fraction 35-40%, 30% proximal left circumflex, 100% tiny obtuse marginal 1 with collaterals, 50% LAD, 50% D1, 100% RCA with collaterals   Chicken pox    Chronic back pain    Colon polyps    Depression    Diabetes mellitus (HCC)    Diet controlled- OFF metformin  02-01-20   E coli infection 12/10/2020   Folliculitis 12/10/2020   GERD (gastroesophageal reflux disease)    Hyperlipidemia    Hypertension    Kidney stone    Myocardial infarction [facility])    ? year- cath 2014/ june    Past Surgical History:  Procedure Laterality Date   CATARACT EXTRACTION  05/2023   COLONOSCOPY     CRYO INTERCOSTAL NERVE BLOCK     double sacrum nerve block   epidural injections     multiple- L5 S1 and cervical neck pain issues as well    POLYPECTOMY     TONSILLECTOMY  1963   TRIGGER FINGER RELEASE  2019    reports that he quit smoking about 26 years ago. His smoking use included cigarettes. He started smoking about 54 years ago. He has a 7  pack-year smoking history. He has never used smokeless tobacco. He reports that he does not drink alcohol and does not use drugs. family history includes ADD / ADHD in his brother; Alcohol abuse in his son; Arthritis in his father and paternal grandmother; COPD in his father; Cancer in his mother; Clotting disorder in his paternal grandfather and paternal grandmother; Colon polyps in his father; Early death in his brother; Heart disease in his maternal grandmother; Hyperlipidemia in his maternal grandmother; Hypertension in his mother; Liver cancer in his paternal grandfather; Melanoma in his mother. Allergies[1]  Review of Systems  Constitutional:  Negative for weight loss.  Cardiovascular:  Negative for chest pain.  Gastrointestinal:  Positive for constipation. Negative for abdominal pain, diarrhea, melena, nausea and vomiting.      Objective:     BP 104/60   Pulse 63   Temp 98.2 F (36.8 C) (Oral)   Wt 206 lb 11.2 oz (93.8 kg)   SpO2 97%   BMI 26.54 kg/m  BP Readings from Last 3 Encounters:  03/22/24 104/60  02/29/24 106/60  02/22/24 110/60   Wt Readings from Last 3 Encounters:  03/22/24 206 lb 11.2 oz (93.8 kg)  02/29/24 205 lb 9.6 oz (93.3 kg)  02/22/24 206 lb 1.6 oz (93.5 kg)  Physical Exam Vitals reviewed.  Constitutional:      General: He is not in acute distress.    Appearance: He is not ill-appearing.  Cardiovascular:     Rate and Rhythm: Normal rate and regular rhythm.  Pulmonary:     Effort: Pulmonary effort is normal.     Breath sounds: Normal breath sounds.  Genitourinary:    Comments: Exam reveals small diminutive hemorrhoids but no active bleeding.  These are very small and at the anal verge.  No visible anal fissure at this time. Neurological:     Mental Status: He is alert.      No results found for any visits on 03/22/24.    The ASCVD Risk score (Arnett DK, et al., 2019) failed to calculate for the following reasons:   Risk score cannot  be calculated because patient has a medical history suggesting prior/existing ASCVD   * - Cholesterol units were assumed    Assessment & Plan:   Problem List Items Addressed This Visit   None Visit Diagnoses       Constipation, unspecified constipation type    -  Primary     Patient has had ongoing intermittent constipation symptoms.  We thought he was using MiraLAX but apparently has only been using stool softener.  We recommended MiraLAX 17 g once daily as needed.  Continue regular exercise habits.  Stay well-hydrated.  Discussed good natural sources of fiber.  No follow-ups on file.    [name], MD     [1]  Allergies Allergen Reactions   Doxycycline  Hyclate Nausea And Vomiting   Latex Other (See Comments)    Other   Tape     Use Paper Tape    Sulfa Antibiotics Rash   "

## 2024-03-23 ENCOUNTER — Ambulatory Visit: Admitting: Podiatry

## 2024-03-23 ENCOUNTER — Ambulatory Visit (INDEPENDENT_AMBULATORY_CARE_PROVIDER_SITE_OTHER)

## 2024-03-23 ENCOUNTER — Encounter (INDEPENDENT_AMBULATORY_CARE_PROVIDER_SITE_OTHER): Payer: Self-pay

## 2024-03-23 VITALS — BP 98/63 | HR 68 | Wt 198.0 lb

## 2024-03-23 DIAGNOSIS — R0981 Nasal congestion: Secondary | ICD-10-CM

## 2024-03-23 DIAGNOSIS — J309 Allergic rhinitis, unspecified: Secondary | ICD-10-CM | POA: Diagnosis not present

## 2024-03-23 DIAGNOSIS — K121 Other forms of stomatitis: Secondary | ICD-10-CM

## 2024-03-23 DIAGNOSIS — J342 Deviated nasal septum: Secondary | ICD-10-CM

## 2024-03-23 MED ORDER — FLUTICASONE PROPIONATE 50 MCG/ACT NA SUSP: 2.0000 | Freq: Two times a day (BID) | NASAL | 12 refills | Status: AC

## 2024-03-23 NOTE — Progress Notes (Signed)
 Discussed the use of AI scribe software for clinical note transcription with the patient, who gave verbal consent to proceed.  History of Present Illness Peter Snyder is a 69 year old male with chronic sinonasal symptoms who presents for follow-up of nasal congestion.  Sinonasal congestion and discharge - Chronic nasal congestion with intermittent sneezing and expulsion of green nasal discharge - No persistent nasal obstruction or ongoing sinus pressure - No nasal discharge over the past two days - Occasional blood-tinged or dark nasal discharge with sneezing, not present recently - Nasal rinsing and Flonase  provide symptomatic relief - Uses Flonase  twice daily; sometimes runs out and supplements with over-the-counter purchases as needed - Discontinued Cetolene due to worsening nasal congestion while on the medication - Completed a course of azithromycin  previously, which resolved symptoms at that time  Oral ulcerations - Recurrent oral ulcers, typically triggered by citrus intake or tongue trauma - Recent episode of multiple ulcers after biting tongue, resolved with compounded mouthwash prescribed by dentist  Ocular symptoms - Longstanding left upper eyelid meibomian gland dysfunction with episodes of pain behind the eye - Ophthalmologist prescribed azithromycin  in a pulsed regimen - Uses Lotemax and Restasis eye drops for ocular symptoms  Physical Exam HEENT: Nose normal. Oral cavity normal. NECK: Neck normal. BP 98/63 (BP Location: Left Arm, Patient Position: Sitting, Cuff Size: Normal)   Pulse 68   Wt 198 lb (89.8 kg)   SpO2 97%   BMI 25.42 kg/m  General: Well developed, well nourished. No acute distress. Voice  Head/Face: Normocephalic. No sinus tenderness. Facial nerve intact and equal bilaterally. No facial lacerations. Eyes: PERRL, no scleral icterus or conjunctival hemorrhage. EOMI. Ears: No gross deformity. Normal external canal. Tympanic membrane bilaterally Hearing:  Normal speech reception.  Nose: No gross deformity or lesions. No purulent discharge. No turbinate hypertrophy. Mouth/Oropharynx: Lips without any lesions. Dentition good. No mucosal lesions within the oropharynx. No tonsillar enlargement, exudate, or lesions. Pharyngeal walls symmetrical. Uvula midline. Tongue midline without lesions. Larynx: See TFL if applicable Nasopharynx: See TFL if applicable Neck: Trachea midline. No masses. No thyromegaly or nodules palpated. No crepitus. Lymphatic: No lymphadenopathy in the neck. Respiratory: No stridor or distress. Room air. Cardiovascular: Regular rate and rhythm. Extremities: No edema or cyanosis. Warm and well-perfused. Skin: No scars or lesions on face or neck. Neurologic: CN II-XII grossly intact. Moving all extremities without gross abnormality. Other:  Assessment & Plan Allergic rhinitis Allergic rhinitis well controlled with Flonase  and Singulair . Occasional sneezing and nasal discharge persist without significant congestion. - azelastine  worsened symptoms - Prescribed Flonase  twice daily. - Advised continuation of Flonase  and Singulair . - Discussed Flonase  acquisition options: [employer] OTC, [employer] delivery. - Instructed to contact office if symptoms worsen or medication issues arise. - Scheduled follow-up in several months.  Acute sinusitis Acute sinusitis resolved post-azithromycin . No significant sinus symptoms currently. - Discussed conditional plan for antibiotics if symptoms recur. - No antibiotics prescribed at this time.  Oral ulcers - continue MM rinse PRN

## 2024-03-27 ENCOUNTER — Other Ambulatory Visit: Payer: Self-pay | Admitting: Family Medicine

## 2024-03-28 ENCOUNTER — Telehealth: Payer: Self-pay

## 2024-03-28 NOTE — Telephone Encounter (Signed)
 Auth Submission: NO AUTH NEEDED Site of care: Site of care: [facility] Payer: Medicare A/B with BCBS supplement Medication & CPT/J Code(s) submitted: Leqvio  (Inclisiran) J1306 Diagnosis Code:  Route of submission (phone, fax, portal):  Phone # Fax # Auth type: Buy/Bill PB Units/visits requested: 284mg  x 2 doses Reference number:  Approval from: 03/28/24 to 04/16/25

## 2024-03-29 ENCOUNTER — Ambulatory Visit: Admitting: Family Medicine

## 2024-03-29 ENCOUNTER — Encounter: Payer: Self-pay | Admitting: Family Medicine

## 2024-03-29 ENCOUNTER — Encounter: Payer: Self-pay | Admitting: Pharmacist Clinician (PhC)/ Clinical Pharmacy Specialist

## 2024-03-29 VITALS — BP 90/50 | HR 70 | Temp 97.7°F | Wt 205.8 lb

## 2024-03-29 DIAGNOSIS — K5900 Constipation, unspecified: Secondary | ICD-10-CM

## 2024-03-29 DIAGNOSIS — R002 Palpitations: Secondary | ICD-10-CM

## 2024-03-29 NOTE — Patient Instructions (Signed)
 Scale back caffeine at night.

## 2024-03-29 NOTE — Progress Notes (Signed)
 "  Established Patient Office Visit  Subjective   Patient ID: Peter Snyder, male    DOB: 02-Dec-1955  Age: 69 y.o. MRN: [id number]  Chief Complaint  Patient presents with   Constipation   Palpitations    HPI    Peter Snyder is seen with some ongoing issues concerning constipation.  He had tremendous stress issues dealing with one of his sons who has had some drug problems previously.  This son lives down in the [place]  area.  Peter Snyder had colonoscopy 2/22.  No recent bloody stools.  Has bowel movement most days but sometimes has small caliber stools.  Recently did fiber count and consume 22 g that day.  Has been using some stool softeners and MiraLAX but not consistently.  He had some recent palpitations.  History of known PVCs in the past.  Symptoms tend to occur worse at night.  Does drink some caffeine in the form of tea at night.  No alcohol.  He knows that stress may be exacerbating.  Still exercising regularly.  No recent chest pain.  No recent intolerance with exercise.  He recently was placed on Jardiance  per cardiology wonders if he may have some dehydration although he is consuming good amount of fluids most days.  Past Medical History:  Diagnosis Date   Allergy    Allergy to sulfa drugs 12/10/2020   Anxiety 2/98   Arthritis    CAD (coronary artery disease) 08/2012   Cardiac catheterization 6/27/ 2014 ejection fraction 35-40%, 30% proximal left circumflex, 100% tiny obtuse marginal 1 with collaterals, 50% LAD, 50% D1, 100% RCA with collaterals   Chicken pox    Chronic back pain    Colon polyps    Depression    Diabetes mellitus (HCC)    Diet controlled- OFF metformin  02-01-20   E coli infection 12/10/2020   Folliculitis 12/10/2020   GERD (gastroesophageal reflux disease)    Hyperlipidemia    Hypertension    Kidney stone    Myocardial infarction [facility])    ? year- cath 2014/ june    Past Surgical History:  Procedure Laterality Date   CATARACT EXTRACTION  05/2023    COLONOSCOPY     CRYO INTERCOSTAL NERVE BLOCK     double sacrum nerve block   epidural injections     multiple- L5 S1 and cervical neck pain issues as well    POLYPECTOMY     TONSILLECTOMY  1963   TRIGGER FINGER RELEASE  2019    reports that he quit smoking about 26 years ago. His smoking use included cigarettes. He started smoking about 54 years ago. He has a 7 pack-year smoking history. He has never used smokeless tobacco. He reports that he does not drink alcohol and does not use drugs. family history includes ADD / ADHD in his brother; Alcohol abuse in his son; Arthritis in his father and paternal grandmother; COPD in his father; Cancer in his mother; Clotting disorder in his paternal grandfather and paternal grandmother; Colon polyps in his father; Early death in his brother; Heart disease in his maternal grandmother; Hyperlipidemia in his maternal grandmother; Hypertension in his mother; Liver cancer in his paternal grandfather; Melanoma in his mother. Allergies[1]  Review of Systems  Constitutional:  Negative for malaise/fatigue and weight loss.  Eyes:  Negative for blurred vision.  Respiratory:  Negative for shortness of breath.   Cardiovascular:  Positive for palpitations. Negative for chest pain and leg swelling.  Gastrointestinal:  Positive for constipation. Negative for abdominal pain,  blood in stool, diarrhea and melena.  Neurological:  Negative for dizziness, weakness and headaches.      Objective:     BP (!) 90/50   Pulse 70   Temp 97.7 F (36.5 C) (Oral)   Wt 205 lb 12.8 oz (93.4 kg)   SpO2 99%   BMI 26.42 kg/m  BP Readings from Last 3 Encounters:  03/29/24 (!) 90/50  03/23/24 98/63  03/22/24 104/60   Wt Readings from Last 3 Encounters:  03/29/24 205 lb 12.8 oz (93.4 kg)  03/23/24 198 lb (89.8 kg)  03/22/24 206 lb 11.2 oz (93.8 kg)      Physical Exam Vitals reviewed.  Constitutional:      General: He is not in acute distress.    Appearance: He is not  ill-appearing.  Cardiovascular:     Rate and Rhythm: Normal rate and regular rhythm.  Pulmonary:     Effort: Pulmonary effort is normal.     Breath sounds: Normal breath sounds. No wheezing or rales.  Neurological:     Mental Status: He is alert.      No results found for any visits on 03/29/24.    The ASCVD Risk score (Arnett DK, et al., 2019) failed to calculate for the following reasons:   Risk score cannot be calculated because patient has a medical history suggesting prior/existing ASCVD   * - Cholesterol units were assumed    Assessment & Plan:   #1 intermittent palpitations mostly at night.  No recent dizziness or syncope.  No dyspnea or chest pain.  History of PVCs in the past.  Discussed importance of adequate hydration and avoidance of caffeine, alcohol, chocolate.  He has been drinking some caffeinated tea at night and we have recommended leaving that off.  Already on carvedilol .  Did discuss possible outpatient Zio patch monitor if symptoms progressing or follow-up with cardiology  #2 intermittent constipation.  He has scheduled physical in a month.  Check additional labs including TSH at that time.  Discussed importance of adequate fiber of at least 30 g/day, adequate fluids and more consistent use of MiraLAX as needed   [name], MD     [1]  Allergies Allergen Reactions   Doxycycline  Hyclate Nausea And Vomiting   Latex Other (See Comments)    Other   Tape     Use Paper Tape    Sulfa Antibiotics Rash   "

## 2024-04-12 ENCOUNTER — Encounter: Payer: Self-pay | Admitting: Family Medicine

## 2024-04-12 ENCOUNTER — Ambulatory Visit (INDEPENDENT_AMBULATORY_CARE_PROVIDER_SITE_OTHER): Admitting: Family Medicine

## 2024-04-12 VITALS — BP 110/60 | HR 74 | Temp 98.1°F | Wt 206.4 lb

## 2024-04-12 DIAGNOSIS — F418 Other specified anxiety disorders: Secondary | ICD-10-CM | POA: Diagnosis not present

## 2024-04-12 DIAGNOSIS — R002 Palpitations: Secondary | ICD-10-CM

## 2024-04-12 NOTE — Progress Notes (Signed)
 "  Established Patient Office Visit  Subjective   Patient ID: Peter Snyder, male    DOB: October 06, 1955  Age: 69 y.o. MRN: [id number]  Chief Complaint  Patient presents with   Anxiety   Palpitations    HPI   Peter Snyder is seen today to discuss recent increased anxiety symptoms.  He still has a psychiatrist in [place] that he sees and he also has a regular behavioral therapist that he sees here in [place].  Dealing recently with stress issues related to his son who lives in the [place] area.  His son has had some problems with alcohol and recently relapsed.  When he spoke with his psychiatrist recently and they suggested he start taking Xanax  0.25 mg regularly 3 times daily.  He already takes Lexapro  20 mg daily.  He has tolerated therapist about ways to try to cope with anxiety..  He has noticed some increased palpitations.  Had some symptomatic PVCs in the past.  These are worse at night.  He does not use alcohol.  Trying to curtail late day use of caffeine recently.  No recent chest pains.  No intolerance with exercise.  In fact, symptoms (palpitations) seem to be blunted during exercise  Past Medical History:  Diagnosis Date   Allergy    Allergy to sulfa drugs 12/10/2020   Anxiety 2/98   Arthritis    CAD (coronary artery disease) 08/2012   Cardiac catheterization 6/27/ 2014 ejection fraction 35-40%, 30% proximal left circumflex, 100% tiny obtuse marginal 1 with collaterals, 50% LAD, 50% D1, 100% RCA with collaterals   Chicken pox    Chronic back pain    Colon polyps    Depression    Diabetes mellitus (HCC)    Diet controlled- OFF metformin  02-01-20   E coli infection 12/10/2020   Folliculitis 12/10/2020   GERD (gastroesophageal reflux disease)    Hyperlipidemia    Hypertension    Kidney stone    Myocardial infarction [facility])    ? year- cath 2014/ june    Past Surgical History:  Procedure Laterality Date   CATARACT EXTRACTION  05/2023   COLONOSCOPY     CRYO INTERCOSTAL NERVE  BLOCK     double sacrum nerve block   epidural injections     multiple- L5 S1 and cervical neck pain issues as well    POLYPECTOMY     TONSILLECTOMY  1963   TRIGGER FINGER RELEASE  2019    reports that he quit smoking about 26 years ago. His smoking use included cigarettes. He started smoking about 54 years ago. He has a 7 pack-year smoking history. He has never used smokeless tobacco. He reports that he does not drink alcohol and does not use drugs. family history includes ADD / ADHD in his brother; Alcohol abuse in his son; Arthritis in his father and paternal grandmother; COPD in his father; Cancer in his mother; Clotting disorder in his paternal grandfather and paternal grandmother; Colon polyps in his father; Early death in his brother; Heart disease in his maternal grandmother; Hyperlipidemia in his maternal grandmother; Hypertension in his mother; Liver cancer in his paternal grandfather; Melanoma in his mother. Allergies[1]  Review of Systems  Constitutional:  Negative for malaise/fatigue.  Eyes:  Negative for blurred vision.  Respiratory:  Negative for shortness of breath.   Cardiovascular:  Positive for palpitations. Negative for chest pain.  Neurological:  Negative for dizziness, weakness and headaches.  Psychiatric/Behavioral:  Negative for suicidal ideas. The patient is nervous/anxious and has insomnia.  Objective:     BP 110/60   Pulse 74   Temp 98.1 F (36.7 C) (Oral)   Wt 206 lb 6.4 oz (93.6 kg)   SpO2 97%   BMI 26.50 kg/m  BP Readings from Last 3 Encounters:  04/12/24 110/60  03/29/24 (!) 90/50  03/23/24 98/63   Wt Readings from Last 3 Encounters:  04/12/24 206 lb 6.4 oz (93.6 kg)  03/29/24 205 lb 12.8 oz (93.4 kg)  03/23/24 198 lb (89.8 kg)      Physical Exam Vitals reviewed.  Constitutional:      General: He is not in acute distress.    Appearance: He is not ill-appearing.  Cardiovascular:     Rate and Rhythm: Normal rate and regular rhythm.   Pulmonary:     Effort: Pulmonary effort is normal.     Breath sounds: Normal breath sounds. No wheezing or rales.  Neurological:     General: No focal deficit present.     Mental Status: He is alert.      No results found for any visits on 04/12/24.    The ASCVD Risk score (Arnett DK, et al., 2019) failed to calculate for the following reasons:   Risk score cannot be calculated because patient has a medical history suggesting prior/existing ASCVD   * - Cholesterol units were assumed    Assessment & Plan:   Peter Snyder is seen with recent increase anxiety symptoms particularly related to his son who lives in [place] who has history of alcohol abuse.  He has also noted concomitant increase in palpitations with history of PVCs.  No chest pain.  He has psychiatrist and regular therapist here and has been in contact with both of them within the past week.  His psychiatrist recommended increasing his Xanax  0.25 mg to 1 3 times daily.  He has recently been taking this very infrequently.  We also suggested the following  - Continue regular exercise habits -Continue to minimize caffeine - Discussed nonpharmacologic ways to help manage stress and anxiety with handout given -Redirect activities and thoughts to hobbies and more productive things when noting increased worry and anxiety. -Consider some deep breathing exercises  No follow-ups on file.    [name], MD     [1]  Allergies Allergen Reactions   Doxycycline  Hyclate Nausea And Vomiting   Latex Other (See Comments)    Other   Tape     Use Paper Tape    Sulfa Antibiotics Rash   "

## 2024-04-14 ENCOUNTER — Ambulatory Visit (INDEPENDENT_AMBULATORY_CARE_PROVIDER_SITE_OTHER)

## 2024-04-14 VITALS — BP 131/75 | HR 65 | Temp 97.7°F | Resp 18 | Ht 74.0 in | Wt 207.4 lb

## 2024-04-14 DIAGNOSIS — E785 Hyperlipidemia, unspecified: Secondary | ICD-10-CM

## 2024-04-14 MED ORDER — INCLISIRAN SODIUM 284 MG/1.5ML ~~LOC~~ SOSY
284.0000 mg | PREFILLED_SYRINGE | Freq: Once | SUBCUTANEOUS | Status: AC
  Administered 2024-04-14: 284 mg via SUBCUTANEOUS
  Filled 2024-04-14: qty 1.5

## 2024-04-14 NOTE — Progress Notes (Signed)
 Diagnosis: , Hyperlipidemia  Provider:  Mannam, Praveen MD  Procedure: Injection  Leqvio  (inclisiran), Dose: 284 mg, Site: subcutaneous, Number of injections: 1  Injection Site(s): Left arm  Post Care: Injection  Discharge: Condition: Good, Destination: Home . AVS Declined  Performed by:  Donny Childes, RN

## 2024-05-04 ENCOUNTER — Encounter: Admitting: Family Medicine

## 2024-06-22 ENCOUNTER — Ambulatory Visit

## 2024-06-28 ENCOUNTER — Ambulatory Visit (INDEPENDENT_AMBULATORY_CARE_PROVIDER_SITE_OTHER)

## 2024-06-29 ENCOUNTER — Ambulatory Visit

## 2024-10-11 ENCOUNTER — Ambulatory Visit
